# Patient Record
Sex: Female | Born: 1970 | Race: Black or African American | Hispanic: No | Marital: Single | Smoking: Never smoker
Health system: Southern US, Community
[De-identification: ages and names within clinical notes are randomized; demographics above are authoritative.]

## PROBLEM LIST (undated history)

## (undated) ENCOUNTER — Ambulatory Visit (HOSPITAL_COMMUNITY): Discharge status: Against Medical Advice | Payer: Medicare Other

## (undated) DIAGNOSIS — Z973 Presence of spectacles and contact lenses: Secondary | ICD-10-CM

## (undated) DIAGNOSIS — E785 Hyperlipidemia, unspecified: Secondary | ICD-10-CM

## (undated) DIAGNOSIS — J329 Chronic sinusitis, unspecified: Secondary | ICD-10-CM

## (undated) DIAGNOSIS — M1990 Unspecified osteoarthritis, unspecified site: Secondary | ICD-10-CM

## (undated) DIAGNOSIS — M2550 Pain in unspecified joint: Secondary | ICD-10-CM

## (undated) DIAGNOSIS — R519 Headache, unspecified: Secondary | ICD-10-CM

## (undated) DIAGNOSIS — R0602 Shortness of breath: Secondary | ICD-10-CM

## (undated) DIAGNOSIS — G4730 Sleep apnea, unspecified: Secondary | ICD-10-CM

## (undated) DIAGNOSIS — G43909 Migraine, unspecified, not intractable, without status migrainosus: Secondary | ICD-10-CM

## (undated) DIAGNOSIS — M549 Dorsalgia, unspecified: Secondary | ICD-10-CM

## (undated) DIAGNOSIS — R51 Headache: Secondary | ICD-10-CM

## (undated) DIAGNOSIS — R079 Chest pain, unspecified: Secondary | ICD-10-CM

## (undated) DIAGNOSIS — R5383 Other fatigue: Secondary | ICD-10-CM

## (undated) DIAGNOSIS — I8391 Asymptomatic varicose veins of right lower extremity: Secondary | ICD-10-CM

## (undated) DIAGNOSIS — F419 Anxiety disorder, unspecified: Secondary | ICD-10-CM

## (undated) DIAGNOSIS — D649 Anemia, unspecified: Secondary | ICD-10-CM

## (undated) DIAGNOSIS — G8929 Other chronic pain: Secondary | ICD-10-CM

## (undated) DIAGNOSIS — I10 Essential (primary) hypertension: Secondary | ICD-10-CM

## (undated) DIAGNOSIS — E669 Obesity, unspecified: Secondary | ICD-10-CM

## (undated) DIAGNOSIS — I82409 Acute embolism and thrombosis of unspecified deep veins of unspecified lower extremity: Secondary | ICD-10-CM

## (undated) DIAGNOSIS — J189 Pneumonia, unspecified organism: Secondary | ICD-10-CM

## (undated) DIAGNOSIS — I2699 Other pulmonary embolism without acute cor pulmonale: Secondary | ICD-10-CM

## (undated) DIAGNOSIS — Z972 Presence of dental prosthetic device (complete) (partial): Secondary | ICD-10-CM

## (undated) DIAGNOSIS — F32A Depression, unspecified: Secondary | ICD-10-CM

## (undated) DIAGNOSIS — I878 Other specified disorders of veins: Secondary | ICD-10-CM

## (undated) DIAGNOSIS — K219 Gastro-esophageal reflux disease without esophagitis: Secondary | ICD-10-CM

## (undated) DIAGNOSIS — F329 Major depressive disorder, single episode, unspecified: Secondary | ICD-10-CM

## (undated) DIAGNOSIS — J42 Unspecified chronic bronchitis: Secondary | ICD-10-CM

## (undated) HISTORY — DX: Essential (primary) hypertension: I10

## (undated) HISTORY — PX: MULTIPLE TOOTH EXTRACTIONS: SHX2053

## (undated) HISTORY — DX: Pain in unspecified joint: M25.50

## (undated) HISTORY — PX: TONSILLECTOMY: SUR1361

## (undated) HISTORY — DX: Shortness of breath: R06.02

## (undated) HISTORY — DX: Other fatigue: R53.83

## (undated) HISTORY — DX: Obesity, unspecified: E66.9

## (undated) HISTORY — PX: LAPAROSCOPIC GASTRIC BYPASS: SUR771

## (undated) HISTORY — DX: Hyperlipidemia, unspecified: E78.5

## (undated) HISTORY — DX: Chest pain, unspecified: R07.9

---

## 1997-11-11 ENCOUNTER — Inpatient Hospital Stay (HOSPITAL_COMMUNITY): Admission: AD | Admit: 1997-11-11 | Discharge: 1997-11-19 | Payer: Self-pay | Admitting: Internal Medicine

## 1998-09-30 ENCOUNTER — Encounter: Admission: RE | Admit: 1998-09-30 | Discharge: 1998-10-23 | Payer: Self-pay | Admitting: Internal Medicine

## 1998-11-11 ENCOUNTER — Encounter: Admission: RE | Admit: 1998-11-11 | Discharge: 1999-02-09 | Payer: Self-pay | Admitting: Internal Medicine

## 1999-02-16 ENCOUNTER — Encounter: Admission: RE | Admit: 1999-02-16 | Discharge: 1999-05-14 | Payer: Self-pay | Admitting: Orthopedic Surgery

## 1999-05-14 ENCOUNTER — Encounter: Admission: RE | Admit: 1999-05-14 | Discharge: 1999-08-12 | Payer: Self-pay | Admitting: Internal Medicine

## 1999-08-12 ENCOUNTER — Encounter (HOSPITAL_COMMUNITY): Admission: RE | Admit: 1999-08-12 | Discharge: 1999-09-24 | Payer: Self-pay | Admitting: Internal Medicine

## 1999-09-29 ENCOUNTER — Encounter: Admission: RE | Admit: 1999-09-29 | Discharge: 1999-12-28 | Payer: Self-pay | Admitting: Internal Medicine

## 1999-10-07 ENCOUNTER — Encounter: Admission: RE | Admit: 1999-10-07 | Discharge: 1999-10-07 | Payer: Self-pay | Admitting: Obstetrics

## 1999-10-25 ENCOUNTER — Emergency Department (HOSPITAL_COMMUNITY): Admission: EM | Admit: 1999-10-25 | Discharge: 1999-10-25 | Payer: Self-pay | Admitting: Emergency Medicine

## 1999-11-24 ENCOUNTER — Emergency Department (HOSPITAL_COMMUNITY): Admission: EM | Admit: 1999-11-24 | Discharge: 1999-11-24 | Payer: Self-pay | Admitting: Internal Medicine

## 1999-12-29 ENCOUNTER — Encounter: Admission: RE | Admit: 1999-12-29 | Discharge: 2000-03-28 | Payer: Self-pay | Admitting: Internal Medicine

## 2000-03-29 ENCOUNTER — Encounter: Admission: RE | Admit: 2000-03-29 | Discharge: 2000-06-27 | Payer: Self-pay | Admitting: Internal Medicine

## 2000-06-27 ENCOUNTER — Encounter (HOSPITAL_COMMUNITY): Admission: RE | Admit: 2000-06-27 | Discharge: 2000-09-25 | Payer: Self-pay | Admitting: Internal Medicine

## 2000-06-30 ENCOUNTER — Encounter: Admission: RE | Admit: 2000-06-30 | Discharge: 2000-09-28 | Payer: Self-pay | Admitting: Orthopedic Surgery

## 2000-10-02 ENCOUNTER — Encounter (HOSPITAL_COMMUNITY): Admission: RE | Admit: 2000-10-02 | Discharge: 2000-12-31 | Payer: Self-pay | Admitting: Family Medicine

## 2000-10-17 ENCOUNTER — Encounter: Admission: RE | Admit: 2000-10-17 | Discharge: 2001-01-15 | Payer: Self-pay | Admitting: Orthopedic Surgery

## 2000-12-02 ENCOUNTER — Emergency Department (HOSPITAL_COMMUNITY): Admission: EM | Admit: 2000-12-02 | Discharge: 2000-12-02 | Payer: Self-pay | Admitting: Emergency Medicine

## 2001-01-15 ENCOUNTER — Encounter: Admission: RE | Admit: 2001-01-15 | Discharge: 2001-04-15 | Payer: Self-pay | Admitting: Orthopedic Surgery

## 2001-04-18 ENCOUNTER — Encounter: Admission: RE | Admit: 2001-04-18 | Discharge: 2001-07-17 | Payer: Self-pay | Admitting: Internal Medicine

## 2001-09-04 ENCOUNTER — Encounter (HOSPITAL_BASED_OUTPATIENT_CLINIC_OR_DEPARTMENT_OTHER): Admission: RE | Admit: 2001-09-04 | Discharge: 2001-12-03 | Payer: Self-pay | Admitting: Orthopedic Surgery

## 2001-11-13 ENCOUNTER — Inpatient Hospital Stay (HOSPITAL_COMMUNITY): Admission: AD | Admit: 2001-11-13 | Discharge: 2001-11-13 | Payer: Self-pay | Admitting: *Deleted

## 2001-11-13 ENCOUNTER — Encounter: Payer: Self-pay | Admitting: *Deleted

## 2001-12-13 ENCOUNTER — Encounter: Admission: RE | Admit: 2001-12-13 | Discharge: 2001-12-13 | Payer: Self-pay | Admitting: Internal Medicine

## 2001-12-24 ENCOUNTER — Ambulatory Visit (HOSPITAL_COMMUNITY): Admission: RE | Admit: 2001-12-24 | Discharge: 2001-12-24 | Payer: Self-pay | Admitting: Obstetrics and Gynecology

## 2001-12-24 ENCOUNTER — Encounter (INDEPENDENT_AMBULATORY_CARE_PROVIDER_SITE_OTHER): Payer: Self-pay | Admitting: Specialist

## 2001-12-24 HISTORY — PX: HYSTEROSCOPY W/D&C: SHX1775

## 2002-01-08 ENCOUNTER — Encounter: Admission: RE | Admit: 2002-01-08 | Discharge: 2002-01-08 | Payer: Self-pay | Admitting: *Deleted

## 2002-06-06 ENCOUNTER — Encounter (HOSPITAL_BASED_OUTPATIENT_CLINIC_OR_DEPARTMENT_OTHER): Admission: RE | Admit: 2002-06-06 | Discharge: 2002-09-04 | Payer: Self-pay | Admitting: Internal Medicine

## 2002-06-15 ENCOUNTER — Encounter: Payer: Self-pay | Admitting: *Deleted

## 2002-06-15 ENCOUNTER — Inpatient Hospital Stay (HOSPITAL_COMMUNITY): Admission: EM | Admit: 2002-06-15 | Discharge: 2002-06-19 | Payer: Self-pay | Admitting: *Deleted

## 2002-09-06 ENCOUNTER — Encounter (HOSPITAL_BASED_OUTPATIENT_CLINIC_OR_DEPARTMENT_OTHER): Admission: RE | Admit: 2002-09-06 | Discharge: 2002-12-05 | Payer: Self-pay | Admitting: Internal Medicine

## 2002-12-06 ENCOUNTER — Encounter (HOSPITAL_BASED_OUTPATIENT_CLINIC_OR_DEPARTMENT_OTHER): Admission: RE | Admit: 2002-12-06 | Discharge: 2003-03-06 | Payer: Self-pay | Admitting: Internal Medicine

## 2003-03-07 ENCOUNTER — Encounter (HOSPITAL_BASED_OUTPATIENT_CLINIC_OR_DEPARTMENT_OTHER): Admission: RE | Admit: 2003-03-07 | Discharge: 2003-06-05 | Payer: Self-pay | Admitting: Internal Medicine

## 2003-03-18 ENCOUNTER — Ambulatory Visit (HOSPITAL_COMMUNITY): Admission: RE | Admit: 2003-03-18 | Discharge: 2003-03-18 | Payer: Self-pay | Admitting: Orthopedic Surgery

## 2003-06-08 ENCOUNTER — Emergency Department (HOSPITAL_COMMUNITY): Admission: EM | Admit: 2003-06-08 | Discharge: 2003-06-08 | Payer: Self-pay | Admitting: Emergency Medicine

## 2003-06-10 ENCOUNTER — Encounter (HOSPITAL_BASED_OUTPATIENT_CLINIC_OR_DEPARTMENT_OTHER): Admission: RE | Admit: 2003-06-10 | Discharge: 2003-09-05 | Payer: Self-pay | Admitting: Internal Medicine

## 2003-08-02 ENCOUNTER — Emergency Department (HOSPITAL_COMMUNITY): Admission: EM | Admit: 2003-08-02 | Discharge: 2003-08-02 | Payer: Self-pay | Admitting: Emergency Medicine

## 2003-09-08 ENCOUNTER — Encounter (HOSPITAL_BASED_OUTPATIENT_CLINIC_OR_DEPARTMENT_OTHER): Admission: RE | Admit: 2003-09-08 | Discharge: 2003-12-07 | Payer: Self-pay | Admitting: Internal Medicine

## 2003-12-09 ENCOUNTER — Encounter (HOSPITAL_BASED_OUTPATIENT_CLINIC_OR_DEPARTMENT_OTHER): Admission: RE | Admit: 2003-12-09 | Discharge: 2004-03-08 | Payer: Self-pay | Admitting: Internal Medicine

## 2004-01-11 ENCOUNTER — Inpatient Hospital Stay (HOSPITAL_COMMUNITY): Admission: AD | Admit: 2004-01-11 | Discharge: 2004-01-11 | Payer: Self-pay | Admitting: Obstetrics and Gynecology

## 2004-02-25 ENCOUNTER — Inpatient Hospital Stay (HOSPITAL_COMMUNITY): Admission: AD | Admit: 2004-02-25 | Discharge: 2004-03-11 | Payer: Self-pay | Admitting: Internal Medicine

## 2004-03-10 ENCOUNTER — Ambulatory Visit: Payer: Self-pay | Admitting: Plastic Surgery

## 2004-03-11 ENCOUNTER — Encounter (HOSPITAL_BASED_OUTPATIENT_CLINIC_OR_DEPARTMENT_OTHER): Admission: RE | Admit: 2004-03-11 | Discharge: 2004-06-09 | Payer: Self-pay | Admitting: Internal Medicine

## 2004-03-15 ENCOUNTER — Ambulatory Visit: Payer: Self-pay | Admitting: Internal Medicine

## 2004-04-12 ENCOUNTER — Ambulatory Visit: Payer: Self-pay | Admitting: Internal Medicine

## 2004-05-24 ENCOUNTER — Ambulatory Visit: Payer: Self-pay | Admitting: Internal Medicine

## 2004-06-22 ENCOUNTER — Ambulatory Visit: Payer: Self-pay | Admitting: Internal Medicine

## 2004-09-10 ENCOUNTER — Ambulatory Visit: Payer: Self-pay | Admitting: Internal Medicine

## 2004-09-30 ENCOUNTER — Ambulatory Visit: Payer: Self-pay | Admitting: Internal Medicine

## 2004-12-20 ENCOUNTER — Ambulatory Visit: Payer: Self-pay | Admitting: Internal Medicine

## 2005-05-11 ENCOUNTER — Ambulatory Visit: Payer: Self-pay | Admitting: Internal Medicine

## 2005-06-13 ENCOUNTER — Encounter: Admission: RE | Admit: 2005-06-13 | Discharge: 2005-09-11 | Payer: Self-pay | Admitting: Internal Medicine

## 2005-07-12 ENCOUNTER — Encounter (HOSPITAL_BASED_OUTPATIENT_CLINIC_OR_DEPARTMENT_OTHER): Admission: RE | Admit: 2005-07-12 | Discharge: 2005-07-18 | Payer: Self-pay | Admitting: Surgery

## 2005-07-18 ENCOUNTER — Ambulatory Visit: Payer: Self-pay | Admitting: Family Medicine

## 2005-08-10 ENCOUNTER — Ambulatory Visit: Payer: Self-pay | Admitting: Internal Medicine

## 2005-08-22 ENCOUNTER — Ambulatory Visit (HOSPITAL_COMMUNITY): Admission: RE | Admit: 2005-08-22 | Discharge: 2005-08-22 | Payer: Self-pay | Admitting: Orthopedic Surgery

## 2005-08-24 ENCOUNTER — Ambulatory Visit: Payer: Self-pay | Admitting: Internal Medicine

## 2005-08-25 ENCOUNTER — Ambulatory Visit: Payer: Self-pay | Admitting: Internal Medicine

## 2005-08-26 ENCOUNTER — Ambulatory Visit: Payer: Self-pay | Admitting: Internal Medicine

## 2005-10-05 ENCOUNTER — Encounter: Admission: RE | Admit: 2005-10-05 | Discharge: 2005-10-05 | Payer: Self-pay | Admitting: Internal Medicine

## 2005-10-12 ENCOUNTER — Ambulatory Visit: Payer: Self-pay | Admitting: Internal Medicine

## 2005-11-02 ENCOUNTER — Encounter: Admission: RE | Admit: 2005-11-02 | Discharge: 2006-01-31 | Payer: Self-pay | Admitting: Internal Medicine

## 2005-11-24 ENCOUNTER — Ambulatory Visit: Payer: Self-pay | Admitting: Obstetrics and Gynecology

## 2005-11-25 ENCOUNTER — Ambulatory Visit: Payer: Self-pay | Admitting: Obstetrics & Gynecology

## 2005-12-15 ENCOUNTER — Encounter: Admission: RE | Admit: 2005-12-15 | Discharge: 2005-12-15 | Payer: Self-pay | Admitting: Internal Medicine

## 2006-01-08 ENCOUNTER — Inpatient Hospital Stay (HOSPITAL_COMMUNITY): Admission: EM | Admit: 2006-01-08 | Discharge: 2006-01-14 | Payer: Self-pay | Admitting: Emergency Medicine

## 2006-01-08 ENCOUNTER — Encounter: Payer: Self-pay | Admitting: Vascular Surgery

## 2006-01-08 ENCOUNTER — Ambulatory Visit: Payer: Self-pay | Admitting: Family Medicine

## 2006-01-17 ENCOUNTER — Ambulatory Visit: Payer: Self-pay | Admitting: Family Medicine

## 2006-02-09 ENCOUNTER — Ambulatory Visit: Payer: Self-pay | Admitting: Family Medicine

## 2006-02-10 ENCOUNTER — Encounter: Admission: RE | Admit: 2006-02-10 | Discharge: 2006-05-11 | Payer: Self-pay | Admitting: Internal Medicine

## 2006-02-14 ENCOUNTER — Ambulatory Visit: Payer: Self-pay | Admitting: Internal Medicine

## 2006-03-13 ENCOUNTER — Ambulatory Visit: Payer: Self-pay | Admitting: Internal Medicine

## 2006-04-18 ENCOUNTER — Encounter: Admission: RE | Admit: 2006-04-18 | Discharge: 2006-07-17 | Payer: Self-pay | Admitting: Internal Medicine

## 2006-05-05 ENCOUNTER — Ambulatory Visit: Payer: Self-pay | Admitting: Internal Medicine

## 2006-06-02 ENCOUNTER — Ambulatory Visit: Payer: Self-pay | Admitting: Internal Medicine

## 2006-06-13 ENCOUNTER — Ambulatory Visit: Payer: Self-pay | Admitting: Internal Medicine

## 2006-07-12 ENCOUNTER — Ambulatory Visit: Payer: Self-pay | Admitting: Internal Medicine

## 2006-07-28 ENCOUNTER — Ambulatory Visit: Payer: Self-pay | Admitting: Internal Medicine

## 2006-09-19 ENCOUNTER — Ambulatory Visit: Payer: Self-pay | Admitting: Internal Medicine

## 2006-10-19 ENCOUNTER — Ambulatory Visit: Payer: Self-pay | Admitting: Internal Medicine

## 2006-11-03 ENCOUNTER — Ambulatory Visit: Payer: Self-pay | Admitting: Internal Medicine

## 2006-11-23 ENCOUNTER — Ambulatory Visit: Payer: Self-pay | Admitting: Internal Medicine

## 2006-12-02 DIAGNOSIS — G4733 Obstructive sleep apnea (adult) (pediatric): Secondary | ICD-10-CM | POA: Insufficient documentation

## 2006-12-02 DIAGNOSIS — K219 Gastro-esophageal reflux disease without esophagitis: Secondary | ICD-10-CM | POA: Insufficient documentation

## 2006-12-02 DIAGNOSIS — Z86711 Personal history of pulmonary embolism: Secondary | ICD-10-CM | POA: Insufficient documentation

## 2006-12-02 DIAGNOSIS — I87039 Postthrombotic syndrome with ulcer and inflammation of unspecified lower extremity: Secondary | ICD-10-CM | POA: Insufficient documentation

## 2006-12-02 DIAGNOSIS — I10 Essential (primary) hypertension: Secondary | ICD-10-CM | POA: Insufficient documentation

## 2006-12-02 DIAGNOSIS — D508 Other iron deficiency anemias: Secondary | ICD-10-CM | POA: Insufficient documentation

## 2006-12-02 DIAGNOSIS — Z86718 Personal history of other venous thrombosis and embolism: Secondary | ICD-10-CM | POA: Insufficient documentation

## 2006-12-07 ENCOUNTER — Ambulatory Visit: Payer: Self-pay | Admitting: Obstetrics & Gynecology

## 2006-12-07 ENCOUNTER — Encounter (INDEPENDENT_AMBULATORY_CARE_PROVIDER_SITE_OTHER): Payer: Self-pay | Admitting: Gynecology

## 2007-01-03 ENCOUNTER — Ambulatory Visit: Payer: Self-pay | Admitting: Internal Medicine

## 2007-01-04 ENCOUNTER — Ambulatory Visit: Payer: Self-pay | Admitting: Obstetrics & Gynecology

## 2007-01-09 ENCOUNTER — Telehealth (INDEPENDENT_AMBULATORY_CARE_PROVIDER_SITE_OTHER): Payer: Self-pay | Admitting: Internal Medicine

## 2007-01-26 ENCOUNTER — Telehealth (INDEPENDENT_AMBULATORY_CARE_PROVIDER_SITE_OTHER): Payer: Self-pay | Admitting: Internal Medicine

## 2007-01-28 ENCOUNTER — Encounter (INDEPENDENT_AMBULATORY_CARE_PROVIDER_SITE_OTHER): Payer: Self-pay | Admitting: Internal Medicine

## 2007-02-01 ENCOUNTER — Ambulatory Visit: Payer: Self-pay | Admitting: Obstetrics and Gynecology

## 2007-02-01 ENCOUNTER — Ambulatory Visit: Payer: Self-pay | Admitting: Internal Medicine

## 2007-02-23 ENCOUNTER — Encounter (INDEPENDENT_AMBULATORY_CARE_PROVIDER_SITE_OTHER): Payer: Self-pay | Admitting: Internal Medicine

## 2007-03-03 ENCOUNTER — Emergency Department (HOSPITAL_COMMUNITY): Admission: EM | Admit: 2007-03-03 | Discharge: 2007-03-03 | Payer: Self-pay | Admitting: Emergency Medicine

## 2007-06-04 ENCOUNTER — Encounter (INDEPENDENT_AMBULATORY_CARE_PROVIDER_SITE_OTHER): Payer: Self-pay | Admitting: Internal Medicine

## 2007-06-21 ENCOUNTER — Encounter (INDEPENDENT_AMBULATORY_CARE_PROVIDER_SITE_OTHER): Payer: Self-pay | Admitting: Internal Medicine

## 2007-06-24 ENCOUNTER — Telehealth (INDEPENDENT_AMBULATORY_CARE_PROVIDER_SITE_OTHER): Payer: Self-pay | Admitting: Internal Medicine

## 2007-06-28 ENCOUNTER — Ambulatory Visit: Payer: Self-pay | Admitting: Internal Medicine

## 2007-07-12 ENCOUNTER — Ambulatory Visit: Payer: Self-pay | Admitting: Internal Medicine

## 2007-08-03 ENCOUNTER — Encounter (INDEPENDENT_AMBULATORY_CARE_PROVIDER_SITE_OTHER): Payer: Self-pay | Admitting: Internal Medicine

## 2007-08-07 ENCOUNTER — Encounter: Admission: RE | Admit: 2007-08-07 | Discharge: 2007-11-05 | Payer: Self-pay | Admitting: Internal Medicine

## 2007-08-27 ENCOUNTER — Telehealth (INDEPENDENT_AMBULATORY_CARE_PROVIDER_SITE_OTHER): Payer: Self-pay | Admitting: Internal Medicine

## 2007-08-30 ENCOUNTER — Telehealth (INDEPENDENT_AMBULATORY_CARE_PROVIDER_SITE_OTHER): Payer: Self-pay | Admitting: Internal Medicine

## 2007-09-05 ENCOUNTER — Telehealth (INDEPENDENT_AMBULATORY_CARE_PROVIDER_SITE_OTHER): Payer: Self-pay | Admitting: Internal Medicine

## 2007-09-14 ENCOUNTER — Ambulatory Visit: Payer: Self-pay | Admitting: Internal Medicine

## 2007-09-14 LAB — CONVERTED CEMR LAB
Alkaline Phosphatase: 148 units/L — ABNORMAL HIGH (ref 39–117)
Creatinine, Ser: 1.2 mg/dL (ref 0.40–1.20)
Glucose, Bld: 67 mg/dL — ABNORMAL LOW (ref 70–99)
HDL: 52 mg/dL (ref >39)
LDL Cholesterol: 92 mg/dL (ref 0–99)
Sodium: 142 meq/L (ref 135–145)
Total Bilirubin: 0.4 mg/dL (ref 0.3–1.2)
Total CHOL/HDL Ratio: 2.9
Total Protein: 7.7 g/dL (ref 6.0–8.3)
Triglycerides: 46 mg/dL (ref <150)
VLDL: 9 mg/dL (ref 0–40)

## 2007-09-15 ENCOUNTER — Encounter (INDEPENDENT_AMBULATORY_CARE_PROVIDER_SITE_OTHER): Payer: Self-pay | Admitting: Internal Medicine

## 2007-09-16 ENCOUNTER — Encounter (INDEPENDENT_AMBULATORY_CARE_PROVIDER_SITE_OTHER): Payer: Self-pay | Admitting: Internal Medicine

## 2007-09-20 ENCOUNTER — Ambulatory Visit: Payer: Self-pay | Admitting: Internal Medicine

## 2007-09-26 ENCOUNTER — Telehealth (INDEPENDENT_AMBULATORY_CARE_PROVIDER_SITE_OTHER): Payer: Self-pay | Admitting: Internal Medicine

## 2007-09-28 ENCOUNTER — Ambulatory Visit: Payer: Self-pay | Admitting: Internal Medicine

## 2007-09-30 ENCOUNTER — Ambulatory Visit: Payer: Self-pay | Admitting: Internal Medicine

## 2007-09-30 ENCOUNTER — Inpatient Hospital Stay (HOSPITAL_COMMUNITY): Admission: EM | Admit: 2007-09-30 | Discharge: 2007-10-02 | Payer: Self-pay | Admitting: Emergency Medicine

## 2007-10-02 ENCOUNTER — Encounter (INDEPENDENT_AMBULATORY_CARE_PROVIDER_SITE_OTHER): Payer: Self-pay | Admitting: Internal Medicine

## 2007-10-08 ENCOUNTER — Encounter (INDEPENDENT_AMBULATORY_CARE_PROVIDER_SITE_OTHER): Payer: Self-pay | Admitting: Internal Medicine

## 2007-10-08 ENCOUNTER — Encounter: Payer: Self-pay | Admitting: Infectious Diseases

## 2007-10-10 ENCOUNTER — Ambulatory Visit: Payer: Self-pay | Admitting: Infectious Diseases

## 2007-10-10 DIAGNOSIS — L089 Local infection of the skin and subcutaneous tissue, unspecified: Secondary | ICD-10-CM

## 2007-10-12 ENCOUNTER — Encounter (INDEPENDENT_AMBULATORY_CARE_PROVIDER_SITE_OTHER): Payer: Self-pay | Admitting: Internal Medicine

## 2007-10-18 ENCOUNTER — Ambulatory Visit: Payer: Self-pay | Admitting: Internal Medicine

## 2007-10-19 ENCOUNTER — Encounter (INDEPENDENT_AMBULATORY_CARE_PROVIDER_SITE_OTHER): Payer: Self-pay | Admitting: Internal Medicine

## 2007-11-05 ENCOUNTER — Ambulatory Visit: Payer: Self-pay | Admitting: Vascular Surgery

## 2007-11-05 ENCOUNTER — Encounter (INDEPENDENT_AMBULATORY_CARE_PROVIDER_SITE_OTHER): Payer: Self-pay | Admitting: Emergency Medicine

## 2007-11-05 ENCOUNTER — Inpatient Hospital Stay (HOSPITAL_COMMUNITY): Admission: EM | Admit: 2007-11-05 | Discharge: 2007-11-11 | Payer: Self-pay | Admitting: Emergency Medicine

## 2007-11-06 ENCOUNTER — Encounter (INDEPENDENT_AMBULATORY_CARE_PROVIDER_SITE_OTHER): Payer: Self-pay | Admitting: Hospitalist

## 2007-11-11 ENCOUNTER — Encounter (INDEPENDENT_AMBULATORY_CARE_PROVIDER_SITE_OTHER): Payer: Self-pay | Admitting: Internal Medicine

## 2007-11-13 ENCOUNTER — Encounter (INDEPENDENT_AMBULATORY_CARE_PROVIDER_SITE_OTHER): Payer: Self-pay | Admitting: Internal Medicine

## 2007-11-14 ENCOUNTER — Ambulatory Visit: Payer: Self-pay | Admitting: Internal Medicine

## 2007-11-20 ENCOUNTER — Encounter: Admission: RE | Admit: 2007-11-20 | Discharge: 2008-01-10 | Payer: Self-pay | Admitting: Internal Medicine

## 2007-11-21 ENCOUNTER — Encounter (INDEPENDENT_AMBULATORY_CARE_PROVIDER_SITE_OTHER): Payer: Self-pay | Admitting: Internal Medicine

## 2007-11-22 ENCOUNTER — Ambulatory Visit: Payer: Self-pay | Admitting: Internal Medicine

## 2007-11-22 LAB — CONVERTED CEMR LAB: Prothrombin Time: 34.4 s — ABNORMAL HIGH (ref 11.6–15.2)

## 2007-11-28 ENCOUNTER — Ambulatory Visit: Payer: Self-pay | Admitting: Internal Medicine

## 2007-12-05 ENCOUNTER — Ambulatory Visit: Payer: Self-pay | Admitting: Internal Medicine

## 2007-12-07 LAB — CONVERTED CEMR LAB
INR: 3.7 — ABNORMAL HIGH (ref 0.0–1.5)
Prothrombin Time: 40.1 s — ABNORMAL HIGH (ref 11.6–15.2)

## 2007-12-12 ENCOUNTER — Ambulatory Visit: Payer: Self-pay | Admitting: Internal Medicine

## 2007-12-13 ENCOUNTER — Ambulatory Visit: Payer: Self-pay | Admitting: Internal Medicine

## 2007-12-13 DIAGNOSIS — M545 Low back pain, unspecified: Secondary | ICD-10-CM | POA: Insufficient documentation

## 2007-12-13 LAB — CONVERTED CEMR LAB
INR: 3.2 — ABNORMAL HIGH (ref 0.0–1.5)
Prothrombin Time: 35 s — ABNORMAL HIGH (ref 11.6–15.2)

## 2007-12-26 ENCOUNTER — Encounter (INDEPENDENT_AMBULATORY_CARE_PROVIDER_SITE_OTHER): Payer: Self-pay | Admitting: Nurse Practitioner

## 2007-12-26 ENCOUNTER — Ambulatory Visit: Payer: Self-pay | Admitting: Internal Medicine

## 2007-12-27 LAB — CONVERTED CEMR LAB: Prothrombin Time: 32.2 s — ABNORMAL HIGH (ref 11.6–15.2)

## 2008-01-01 ENCOUNTER — Ambulatory Visit (HOSPITAL_COMMUNITY): Admission: RE | Admit: 2008-01-01 | Discharge: 2008-01-01 | Payer: Self-pay | Admitting: Orthopedic Surgery

## 2008-01-01 HISTORY — PX: INCISION AND DRAINAGE: SHX5863

## 2008-01-09 ENCOUNTER — Encounter (INDEPENDENT_AMBULATORY_CARE_PROVIDER_SITE_OTHER): Payer: Self-pay | Admitting: Internal Medicine

## 2008-01-10 ENCOUNTER — Telehealth (INDEPENDENT_AMBULATORY_CARE_PROVIDER_SITE_OTHER): Payer: Self-pay | Admitting: Internal Medicine

## 2008-01-10 ENCOUNTER — Ambulatory Visit: Payer: Self-pay | Admitting: Obstetrics and Gynecology

## 2008-01-10 ENCOUNTER — Encounter (INDEPENDENT_AMBULATORY_CARE_PROVIDER_SITE_OTHER): Payer: Self-pay | Admitting: Family Medicine

## 2008-01-22 ENCOUNTER — Encounter: Admission: RE | Admit: 2008-01-22 | Discharge: 2008-04-21 | Payer: Self-pay | Admitting: Internal Medicine

## 2008-01-23 ENCOUNTER — Encounter (INDEPENDENT_AMBULATORY_CARE_PROVIDER_SITE_OTHER): Payer: Self-pay | Admitting: Internal Medicine

## 2008-01-24 ENCOUNTER — Ambulatory Visit: Payer: Self-pay | Admitting: Internal Medicine

## 2008-02-21 ENCOUNTER — Telehealth (INDEPENDENT_AMBULATORY_CARE_PROVIDER_SITE_OTHER): Payer: Self-pay | Admitting: Internal Medicine

## 2008-03-20 ENCOUNTER — Encounter (INDEPENDENT_AMBULATORY_CARE_PROVIDER_SITE_OTHER): Payer: Self-pay | Admitting: Internal Medicine

## 2008-04-30 ENCOUNTER — Ambulatory Visit (HOSPITAL_COMMUNITY): Admission: RE | Admit: 2008-04-30 | Discharge: 2008-04-30 | Payer: Self-pay | Admitting: Orthopedic Surgery

## 2008-06-09 ENCOUNTER — Telehealth (INDEPENDENT_AMBULATORY_CARE_PROVIDER_SITE_OTHER): Payer: Self-pay | Admitting: Internal Medicine

## 2008-06-12 ENCOUNTER — Ambulatory Visit: Payer: Self-pay | Admitting: Internal Medicine

## 2008-07-03 ENCOUNTER — Ambulatory Visit: Payer: Self-pay | Admitting: Internal Medicine

## 2008-07-09 LAB — CONVERTED CEMR LAB
INR: 2.5 — ABNORMAL HIGH (ref 0.0–1.5)
Prothrombin Time: 29 s — ABNORMAL HIGH (ref 11.6–15.2)

## 2008-07-19 IMAGING — CR DG CHEST 2V
2 series · 2 of 2 positions shown · non-contrast
Comparison: 11/07/2007

CLINICAL DATA: Shortness of breath

CHEST - 2 VIEW

[w chest pa]
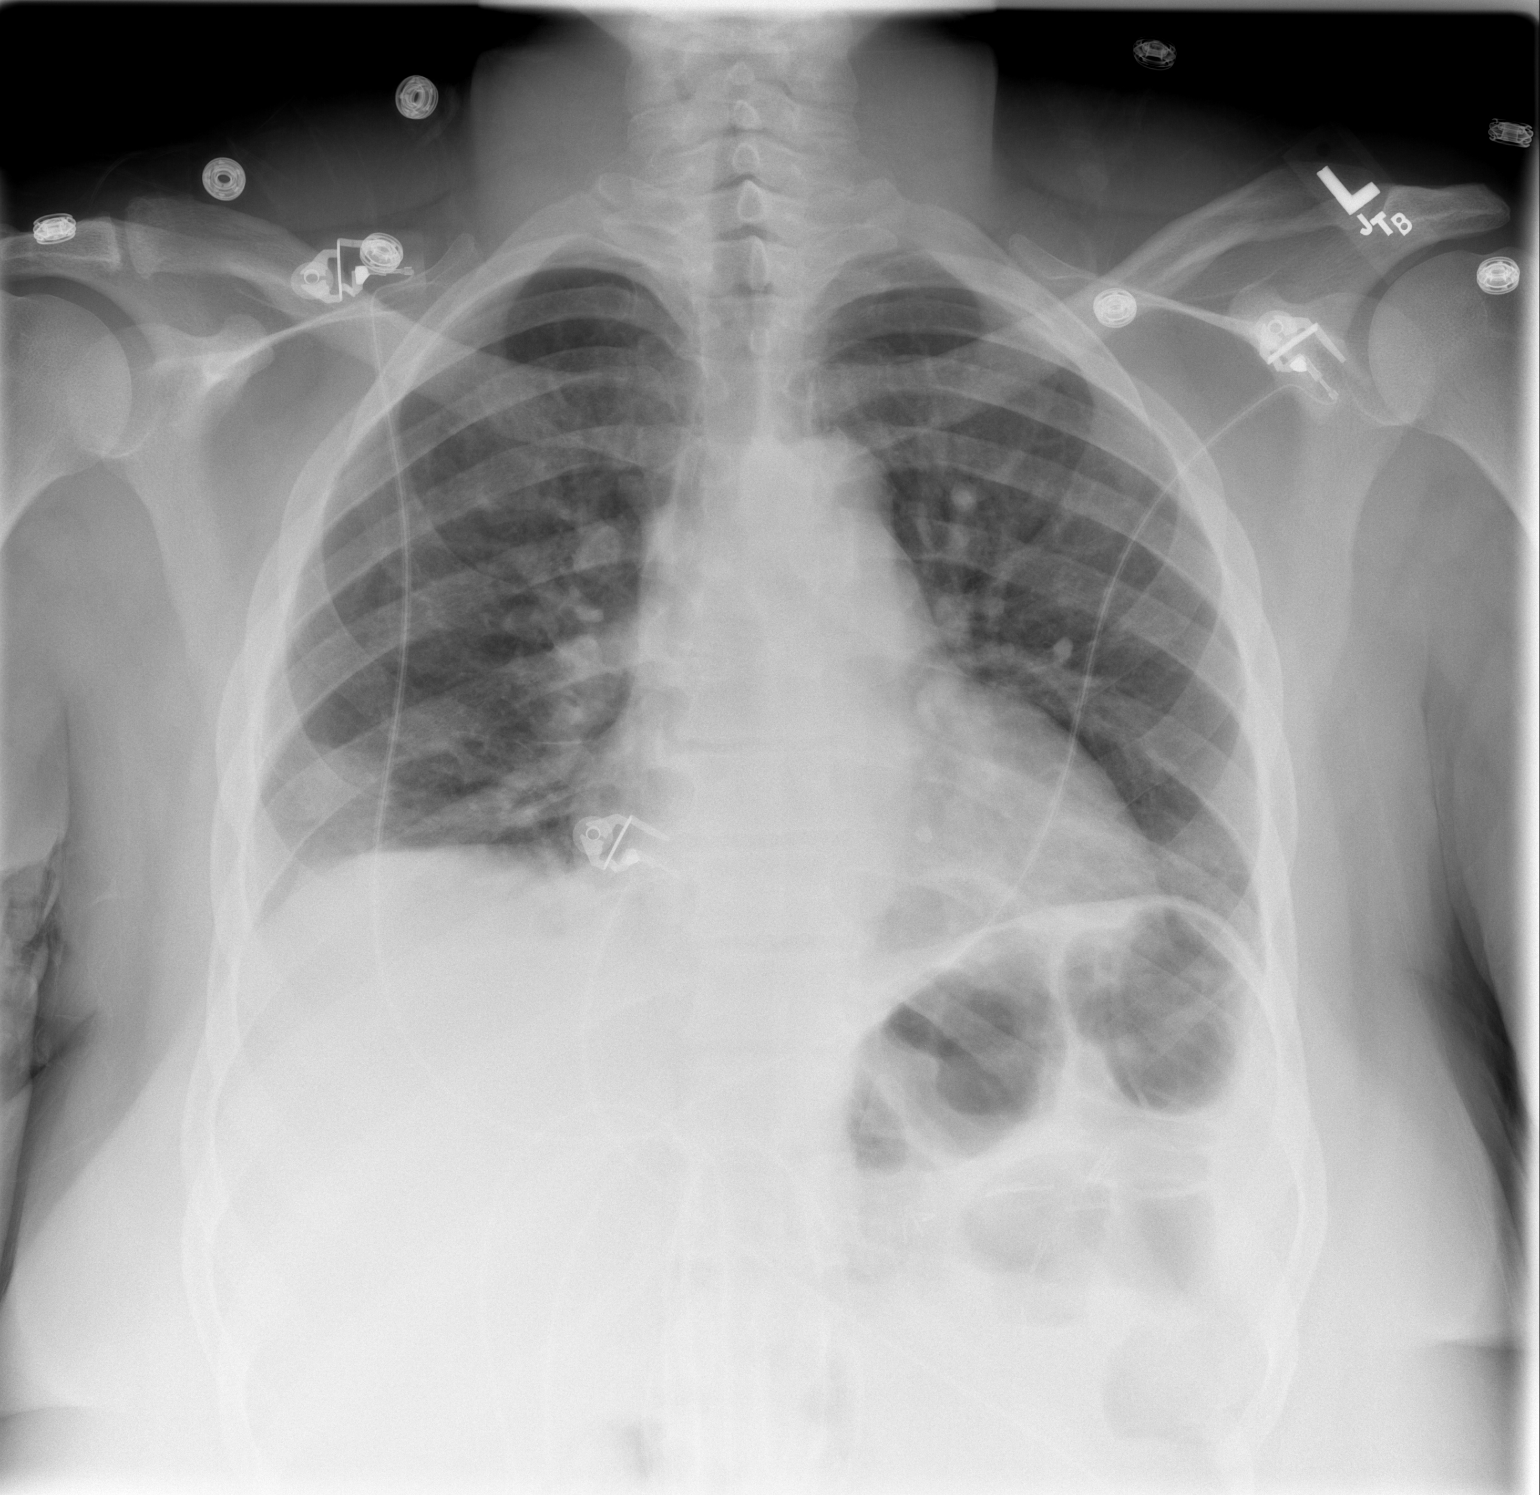

[w chest lat]
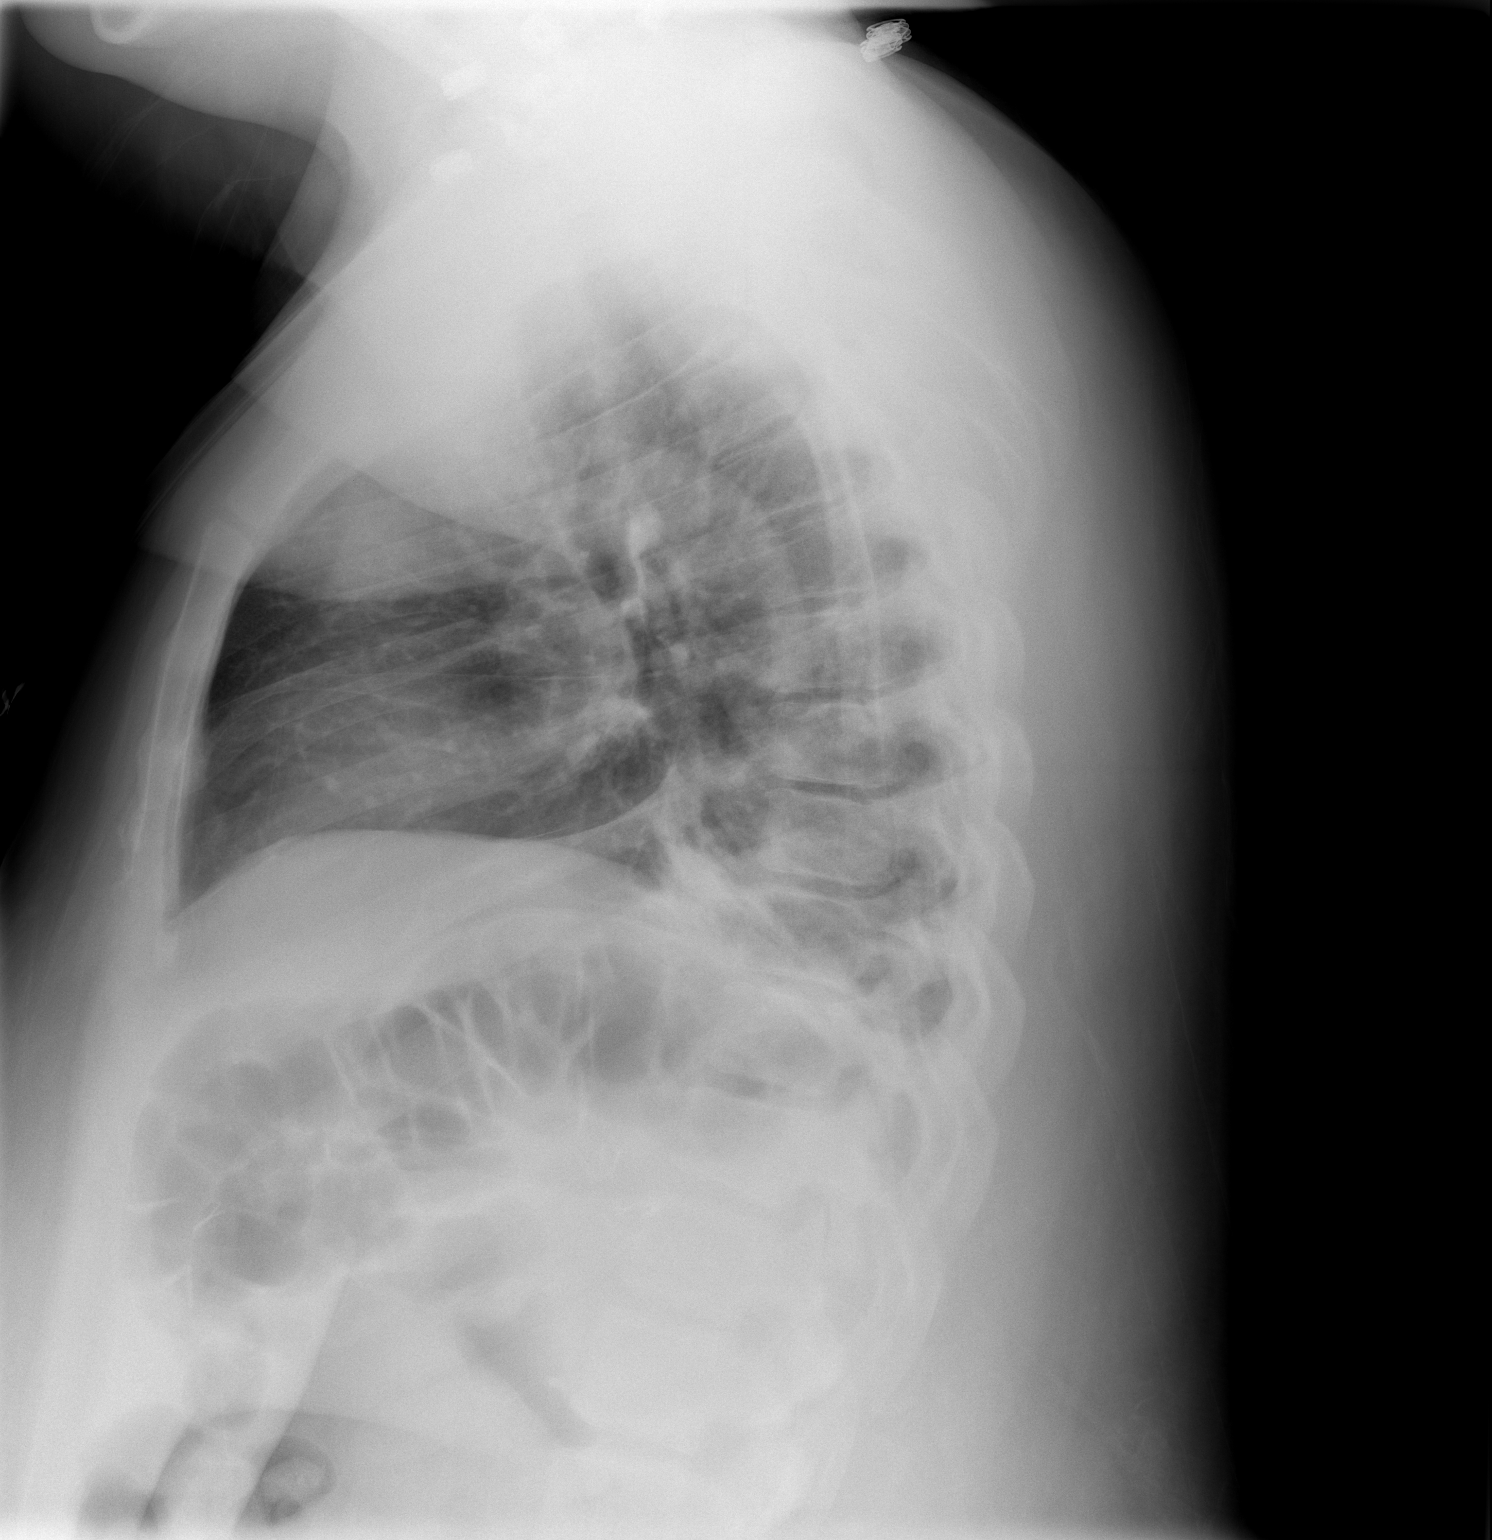

[2 of 2 positions shown; findings below may reference images not displayed]

FINDINGS: Patchy subsegmental atelectasis or infiltrates in the
basilar segments of both lower lobes, right greater than left,
stable since prior study.  Relatively low lung volumes.  Heart size
upper limits normal.  No overt interstitial edema.  No definite
effusion.
IMPRESSION: 1.  Stable bibasilar atelectasis or infiltrates.

## 2008-08-19 ENCOUNTER — Telehealth (INDEPENDENT_AMBULATORY_CARE_PROVIDER_SITE_OTHER): Payer: Self-pay | Admitting: Internal Medicine

## 2008-09-10 ENCOUNTER — Ambulatory Visit (HOSPITAL_COMMUNITY): Admission: RE | Admit: 2008-09-10 | Discharge: 2008-09-10 | Payer: Self-pay | Admitting: Orthopedic Surgery

## 2008-09-10 HISTORY — PX: INCISION AND DRAINAGE: SHX5863

## 2008-11-27 ENCOUNTER — Ambulatory Visit: Payer: Self-pay | Admitting: Internal Medicine

## 2008-12-01 LAB — CONVERTED CEMR LAB: Prothrombin Time: 36 s — ABNORMAL HIGH (ref 11.6–15.2)

## 2008-12-08 ENCOUNTER — Ambulatory Visit: Payer: Self-pay | Admitting: Internal Medicine

## 2008-12-15 LAB — CONVERTED CEMR LAB
INR: 2.5 — ABNORMAL HIGH (ref 0.0–1.5)
Prothrombin Time: 28.6 s — ABNORMAL HIGH (ref 11.6–15.2)

## 2009-01-08 ENCOUNTER — Ambulatory Visit: Payer: Self-pay | Admitting: Internal Medicine

## 2009-01-08 ENCOUNTER — Ambulatory Visit: Payer: Self-pay | Admitting: Obstetrics and Gynecology

## 2009-02-04 ENCOUNTER — Ambulatory Visit: Payer: Self-pay | Admitting: Obstetrics & Gynecology

## 2009-02-05 ENCOUNTER — Encounter (INDEPENDENT_AMBULATORY_CARE_PROVIDER_SITE_OTHER): Payer: Self-pay | Admitting: Internal Medicine

## 2009-02-12 ENCOUNTER — Ambulatory Visit: Payer: Self-pay | Admitting: Internal Medicine

## 2009-02-16 LAB — CONVERTED CEMR LAB: INR: 2.2 — ABNORMAL HIGH (ref 0.0–1.5)

## 2009-06-10 ENCOUNTER — Ambulatory Visit: Payer: Self-pay | Admitting: Internal Medicine

## 2009-06-15 LAB — CONVERTED CEMR LAB
Chloride: 108 meq/L (ref 96–112)
Creatinine, Ser: 0.94 mg/dL (ref 0.40–1.20)
INR: 2.85 — ABNORMAL HIGH (ref <1.50)
Potassium: 4.7 meq/L (ref 3.5–5.3)
Prothrombin Time: 29.7 s — ABNORMAL HIGH (ref 11.6–15.2)

## 2009-06-19 ENCOUNTER — Telehealth: Payer: Self-pay | Admitting: Physician Assistant

## 2009-06-26 ENCOUNTER — Emergency Department (HOSPITAL_COMMUNITY)
Admission: EM | Admit: 2009-06-26 | Discharge: 2009-06-26 | Payer: Self-pay | Source: Home / Self Care | Admitting: Emergency Medicine

## 2009-07-09 ENCOUNTER — Ambulatory Visit: Payer: Self-pay | Admitting: Internal Medicine

## 2009-07-18 LAB — CONVERTED CEMR LAB
Basophils Relative: 0 % (ref 0–1)
Hemoglobin: 14.2 g/dL (ref 12.0–15.0)
Lymphs Abs: 1.7 10*3/uL (ref 0.7–4.0)
MCHC: 33 g/dL (ref 30.0–36.0)
MCV: 85.5 fL (ref 78.0–100.0)
Monocytes Absolute: 0.5 10*3/uL (ref 0.1–1.0)
Monocytes Relative: 9 % (ref 3–12)
Neutro Abs: 3.6 10*3/uL (ref 1.7–7.7)
Neutrophils Relative %: 61 % (ref 43–77)
Prothrombin Time: 21.2 s — ABNORMAL HIGH (ref 11.6–15.2)
RBC: 5.03 M/uL (ref 3.87–5.11)
WBC: 5.8 10*3/uL (ref 4.0–10.5)

## 2009-07-23 ENCOUNTER — Ambulatory Visit: Payer: Self-pay | Admitting: Internal Medicine

## 2009-07-29 LAB — CONVERTED CEMR LAB
CO2: 32 meq/L (ref 19–32)
Calcium: 9.9 mg/dL (ref 8.4–10.5)
Creatinine, Ser: 1.14 mg/dL (ref 0.40–1.20)
INR: 1.98 — ABNORMAL HIGH (ref <1.50)
Prothrombin Time: 22.3 s — ABNORMAL HIGH (ref 11.6–15.2)
Sodium: 141 meq/L (ref 135–145)

## 2009-08-18 ENCOUNTER — Ambulatory Visit: Payer: Self-pay | Admitting: Internal Medicine

## 2009-08-21 LAB — CONVERTED CEMR LAB
INR: 1.75 — ABNORMAL HIGH (ref <1.50)
Prothrombin Time: 20.3 s — ABNORMAL HIGH (ref 11.6–15.2)

## 2009-08-31 ENCOUNTER — Encounter: Payer: Self-pay | Admitting: Physician Assistant

## 2009-08-31 ENCOUNTER — Ambulatory Visit: Payer: Self-pay | Admitting: Internal Medicine

## 2009-09-01 ENCOUNTER — Ambulatory Visit: Payer: Self-pay | Admitting: Internal Medicine

## 2009-09-01 LAB — CONVERTED CEMR LAB
INR: 1.91 — ABNORMAL HIGH (ref <1.50)
Prothrombin Time: 21.7 s — ABNORMAL HIGH (ref 11.6–15.2)

## 2009-09-03 ENCOUNTER — Ambulatory Visit: Payer: Self-pay | Admitting: Internal Medicine

## 2009-09-17 ENCOUNTER — Ambulatory Visit: Payer: Self-pay | Admitting: Internal Medicine

## 2009-10-07 ENCOUNTER — Ambulatory Visit: Payer: Self-pay | Admitting: Internal Medicine

## 2009-10-09 ENCOUNTER — Telehealth (INDEPENDENT_AMBULATORY_CARE_PROVIDER_SITE_OTHER): Payer: Self-pay | Admitting: Internal Medicine

## 2009-10-09 LAB — CONVERTED CEMR LAB: Prothrombin Time: 30.7 s — ABNORMAL HIGH (ref 11.6–15.2)

## 2009-10-23 ENCOUNTER — Telehealth: Payer: Self-pay | Admitting: Physician Assistant

## 2009-12-30 ENCOUNTER — Encounter (INDEPENDENT_AMBULATORY_CARE_PROVIDER_SITE_OTHER): Payer: Self-pay | Admitting: Internal Medicine

## 2010-01-12 ENCOUNTER — Ambulatory Visit: Payer: Self-pay | Admitting: Internal Medicine

## 2010-02-02 ENCOUNTER — Emergency Department (HOSPITAL_COMMUNITY): Admission: EM | Admit: 2010-02-02 | Discharge: 2010-02-02 | Payer: Self-pay | Admitting: Emergency Medicine

## 2010-02-03 LAB — CONVERTED CEMR LAB: INR: 1.48 (ref <1.50)

## 2010-02-16 ENCOUNTER — Telehealth (INDEPENDENT_AMBULATORY_CARE_PROVIDER_SITE_OTHER): Payer: Self-pay | Admitting: Internal Medicine

## 2010-02-22 ENCOUNTER — Encounter: Payer: Self-pay | Admitting: Physician Assistant

## 2010-02-22 ENCOUNTER — Ambulatory Visit: Payer: Self-pay | Admitting: Family Medicine

## 2010-02-22 LAB — CONVERTED CEMR LAB
HCV Ab: NEGATIVE
Hepatitis B Surface Ag: NEGATIVE
Pap Smear: NEGATIVE

## 2010-02-23 ENCOUNTER — Telehealth (INDEPENDENT_AMBULATORY_CARE_PROVIDER_SITE_OTHER): Payer: Self-pay | Admitting: Internal Medicine

## 2010-03-05 ENCOUNTER — Ambulatory Visit: Payer: Self-pay | Admitting: Internal Medicine

## 2010-03-05 DIAGNOSIS — J309 Allergic rhinitis, unspecified: Secondary | ICD-10-CM | POA: Insufficient documentation

## 2010-03-10 LAB — CONVERTED CEMR LAB
AST: 20 units/L (ref 0–37)
Alkaline Phosphatase: 95 units/L (ref 39–117)
BUN: 13 mg/dL (ref 6–23)
Basophils Absolute: 0 10*3/uL (ref 0.0–0.1)
Basophils Relative: 0 % (ref 0–1)
Glucose, Bld: 95 mg/dL (ref 70–99)
Hemoglobin: 13.5 g/dL (ref 12.0–15.0)
Lymphocytes Relative: 22 % (ref 12–46)
MCHC: 33.2 g/dL (ref 30.0–36.0)
Monocytes Absolute: 0.5 10*3/uL (ref 0.1–1.0)
Neutro Abs: 5.1 10*3/uL (ref 1.7–7.7)
Platelets: 261 10*3/uL (ref 150–400)
Potassium: 3.9 meq/L (ref 3.5–5.3)
Prothrombin Time: 20.3 s — ABNORMAL HIGH (ref 11.6–15.2)
RDW: 15.1 % (ref 11.5–15.5)
Sodium: 141 meq/L (ref 135–145)
Total Bilirubin: 0.5 mg/dL (ref 0.3–1.2)
Total Protein: 6.7 g/dL (ref 6.0–8.3)

## 2010-03-25 ENCOUNTER — Ambulatory Visit: Payer: Self-pay | Admitting: Internal Medicine

## 2010-03-26 ENCOUNTER — Encounter (INDEPENDENT_AMBULATORY_CARE_PROVIDER_SITE_OTHER): Payer: Self-pay | Admitting: Internal Medicine

## 2010-03-29 LAB — CONVERTED CEMR LAB: INR: 2.26 — ABNORMAL HIGH (ref <1.50)

## 2010-03-30 ENCOUNTER — Telehealth (INDEPENDENT_AMBULATORY_CARE_PROVIDER_SITE_OTHER): Payer: Self-pay | Admitting: Internal Medicine

## 2010-04-26 ENCOUNTER — Encounter (INDEPENDENT_AMBULATORY_CARE_PROVIDER_SITE_OTHER): Payer: Self-pay | Admitting: Internal Medicine

## 2010-04-26 ENCOUNTER — Ambulatory Visit: Payer: Self-pay | Admitting: Internal Medicine

## 2010-04-30 ENCOUNTER — Telehealth (INDEPENDENT_AMBULATORY_CARE_PROVIDER_SITE_OTHER): Payer: Self-pay | Admitting: Internal Medicine

## 2010-04-30 LAB — CONVERTED CEMR LAB: Prothrombin Time: 17.6 s — ABNORMAL HIGH (ref 11.6–15.2)

## 2010-05-12 ENCOUNTER — Ambulatory Visit: Payer: Self-pay | Admitting: Internal Medicine

## 2010-05-12 ENCOUNTER — Encounter (INDEPENDENT_AMBULATORY_CARE_PROVIDER_SITE_OTHER): Payer: Self-pay | Admitting: Internal Medicine

## 2010-05-13 LAB — CONVERTED CEMR LAB
INR: 1.44 (ref <1.50)
Prothrombin Time: 17.7 s — ABNORMAL HIGH (ref 11.6–15.2)

## 2010-06-15 ENCOUNTER — Encounter (INDEPENDENT_AMBULATORY_CARE_PROVIDER_SITE_OTHER): Payer: Self-pay | Admitting: Internal Medicine

## 2010-06-17 NOTE — Progress Notes (Signed)
Summary: REFILL ON COUMADIN--FYI Alice [name]  Phone Note Call from Patient Call back at [facility] Phone [phone]   Summary of Call: Alice [name] PT. PATIENT STILL WAITING ON COUMADIN RESULTS FROM AUGUST 31, AND REFILL ON COUMADIN Initial call taken by: Roberto Scales,  February 23, 2010 11:15 AM  Follow-up for Phone Call        Needs protime in 2 weeks.  Please make sure we have a good phone number as we have left many messages to follow up on protime.   Please make sure we have the correct dosing:  10 mg on M-Thurs and 2 mg (not 5 mg) on Friday and Saturday. Please remind her that she needs to come in once a month for protimes no matter what--I have asked her to schedule a repeat protime in one month every time she comes in for a protime(this has been discussed with her on many occasions) Let her know her Coumadin has been filled. Follow-up by: Mack Hook MD,  February 23, 2010 1:17 PM  Additional Follow-up for Phone Call Additional follow up Details #1::        left message to return call @ [phone].Marland KitchenMordecai Maes CMA  February 25, 2010 8:55 AM  left message to return call [phone].Marland KitchenMordecai Maes CMA  February 25, 2010 8:55 AM    spoke with patient and she now says that she does not need any refills and that she needed her results only because she was told her level was low. Advised patient to keep appt for next Friday and she can address with provider at that time.Marland KitchenMarland KitchenMarland KitchenMordecai Maes CMA  February 25, 2010 10:14 AM     New/Updated Medications: COUMADIN 2 MG TABS (WARFARIN SODIUM) 1 tab by mouth daily on Friday and Saturday Prescriptions: COUMADIN 2 MG TABS (WARFARIN SODIUM) 1 tab by mouth daily on Friday and Saturday  #30 x 6   Entered and Authorized by:   Mack Hook MD   Signed by:   Mack Hook MD on 02/23/2010   Method used:   Electronically to        [facility] (retail)       [place], [place]  [address]       Ph: [phone]       Fax: [phone]   RxID:   [id number] COUMADIN 10 MG TABS (WARFARIN SODIUM) 1 tab by mouth daily Sunday through Thursday  #30 x 6   Entered and Authorized by:   Elizabeth Mulberry MD   Signed by:   Elizabeth Mulberry MD on 02/23/2010   Method used:   Electronically to        [facility] [address] (retail)       [address]       [place], [place]  [address]       Ph: [phone]       Fax: [phone]   RxID:   [id number]

## 2010-06-17 NOTE — Progress Notes (Signed)
Summary: bp meds were not called in  Phone Note Call from Patient Call back at Home Phone [phone]   Reason for Call: Refill Medication Summary of Call: mulberry pt. Alice [name] is at [facility] now trying to get a refill on her Amlodopine filled. She says she was told by Nicki Reaper that he told her to take 2 of her 5mg  pressure pills to make 10mg . and she was told by someone to that they were sending it in right before she left from here. Initial call taken by: Roberto Scales,  June 19, 2009 4:42 PM  Follow-up for Phone Call        Increased dose of amlodipine called to [facility]. Follow-up by: Shellia Carwin CMA,  June 19, 2009 4:46 PM    New/Updated Medications: AMLODIPINE BESYLATE 10 MG TABS (AMLODIPINE BESYLATE) 1 by mouth once daily

## 2010-06-17 NOTE — Progress Notes (Signed)
Summary: Query:  Refill HCTZ?  Phone Note Outgoing Call   Summary of Call: Do you want to refill her HCTZ?  Last seen 06/2009. Initial call taken by: Sherian Maroon RN,  February 16, 2010 3:57 PM  Follow-up for Phone Call        Darrell Jewel until 06/2010. Please have her come in ASAP for Protime. Make sure she has not missed her Coumadin Follow-up by: Mack Hook MD,  February 22, 2010 10:20 AM  Additional Follow-up for Phone Call Additional follow up Details #1::        Spoke with pt. -- advised of refill.  Made appt. for lab/provider 03/05/10.  Sherian Maroon RN  February 23, 2010 11:50 AM

## 2010-06-17 NOTE — Assessment & Plan Note (Signed)
Summary: f/u in 2 weeks/ bmet/pt inr/ bp check//gk  Nurse Visit   Vital Signs:  Patient profile:   40 year old female BP sitting:   135 / 84  (left arm) Cuff size:   regular  Vitals Entered By: Everett (July 23, 2009 11:25 AM)  Allergies: 1)  ! Oxycodone Hcl (Oxycodone Hcl)  Orders Added: 1)  Est. Patient Level I [id number] 2)  T-Basic Metabolic Panel 0000000 3)  T-Protime, Auto [id number]

## 2010-06-17 NOTE — Letter (Signed)
Summary: NUTRITION & DIABETES/NO SHOWED  NUTRITION & DIABETES/NO SHOWED   Imported By: Roland Earl 01/21/2010 15:56:13  _____________________________________________________________________  External Attachment:    Type:   Image     Comment:   External Document

## 2010-06-17 NOTE — Assessment & Plan Note (Signed)
Summary: f/u bp and draw blood /tmm   Vital Signs:  Patient profile:   40 year old female Weight:      327 pounds BMI:     49.90 Temp:     98.6 degrees F Pulse rate:   86 / minute Pulse rhythm:   regular Resp:     18 per minute BP sitting:   157 / 109  (left arm) Cuff size:   large  Vitals Entered By: Shellia Carwin CMA (July 09, 2009 10:25 AM) CC: f/u bp takes bp med at night and lab work Is Patient Diabetic? No Pain Assessment Patient in pain? no       Does patient need assistance? Ambulation Normal   Primary Care Provider:  Adrian Prows MD  CC:  f/u bp takes bp med at night and lab work.  History of Present Illness: 1.  Anticoagulation:  already had PT drawn--was adequately anticoagulated last month.  Pt. not compliant with regular checks.  No problems with bleeding.  Still taking 10 mg of Coumadin on S-F and 5 mg on Saturday.  2.  Right leg ulcer:  failed the skin graft some time ago (again)  But now close to being healed.  Wrap soon to come off.  Goes in weekly to Ortho to  have a check.  3.  Hypertension:  Never misses Amlodipine.  Pt. feels her bp is up secondary to stress.  Not interested in working with a counselor to help this out--feels she can solve on her own.  Not working out as she was previously.  Would like to go back to [employer] nutrition to work on diet and weight loss.  Pt admits she had been taking 10 mg of Amlodipine at the recommendations of ortho for some time before we increased.  BP still up.  Allergies (verified): 1)  ! Oxycodone Hcl (Oxycodone Hcl)  Physical Exam  General:  obese, NAD--Looks well! Lungs:  Normal respiratory effort, chest expands symmetrically. Lungs are clear to auscultation, no crackles or wheezes. Heart:  Normal rate and regular rhythm. S1 and S2 normal without gallop, murmur, click, rub or other extra sounds.  Radial pulses normal and equal   Impression & Recommendations:  Problem # 1:  HYPERTENSION  (ICD-401.9) ADD HCTZ Her updated medication list for this problem includes:    Amlodipine Besylate 10 Mg Tabs (Amlodipine besylate) ..... One tablet by mouth daily for blood pressure    Hydrochlorothiazide 25 Mg Tabs (Hydrochlorothiazide) .Marland Kitchen... 1 tab by mouth daily for hypertension  Problem # 2:  Hx of PULMONARY EMBOLISM (ICD-415.19)  protime today Her updated medication list for this problem includes:    Coumadin 5 Mg Tabs (Warfarin sodium) .Marland Kitchen... 1 tab by mouth on saturdays.    Coumadin 10 Mg Tabs (Warfarin sodium) .Marland Kitchen... 1 tab by mouth daily sunday through friday  Orders: T-Protime, Auto [phone])  Complete Medication List: 1)  Multiple Vitamin Tabs (Multiple vitamin) .... Once daily 2)  Lyrica 100 Mg Caps (Pregabalin) .... Take 1 tablet by mouth three times a day 3)  Calcium 500 500 Mg Tabs (Calcium carbonate) .Marland Kitchen.. 1 tab by mouth three times a day 4)  Ferrous Sulfate 325 (65 Fe) Mg Tbec (Ferrous sulfate) .Marland Kitchen.. 1 tab by mouth daily 5)  Mirena 20 Mcg/24hr Iud (Levonorgestrel) 6)  Amlodipine Besylate 10 Mg Tabs (Amlodipine besylate) .... One tablet by mouth daily for blood pressure 7)  Curity Sterile Saline 0.9 % Soln (Sodium chloride (gu irrigant)) .... Soak 4x4 gauze  pads and ring out--place against wound 8)  4x4 Gauze Pads  .... Soak as needed for two times a day dressing changes. 9)  Kerlex  .... Wrap leg to secure saline soaked gauze with two times a day dressing changes 10)  Coumadin 5 Mg Tabs (Warfarin sodium) .Marland Kitchen.. 1 tab by mouth on saturdays. 11)  Coumadin 10 Mg Tabs (Warfarin sodium) .Marland Kitchen.. 1 tab by mouth daily sunday through friday 12)  Mirena 20 Mcg/24hr Iud (Levonorgestrel) 13)  Hydrochlorothiazide 25 Mg Tabs (Hydrochlorothiazide) .... 1 tab by mouth daily for hypertension  Other Orders: Nutrition Referral (Nutrition) T-CBC w/Diff (850[address]10)  Patient Instructions: 1)  Lab visit for bp check and BMET in 2 weeks 2)  Lab visit for protime in 1 month 3)  Follow up  with Dr. Faris Coolman in 3 months  Prescriptions: AMLODIPINE BESYLATE 10 MG TABS (AMLODIPINE BESYLATE) One tablet by mouth daily for blood pressure  #30 x 11   Entered and Authorized by:   Dayane Hillenburg MD   Signed by:   Mandeep Ferch MD on 07/09/2009   Method used:   Electronically to        [facility] [address] (retail)       [address]       [place]       [place], [place]  [address]       Ph: [phone]       Fax: [phone]   RxID:   [id number] HYDROCHLOROTHIAZIDE 25 MG TABS (HYDROCHLOROTHIAZIDE) 1 tab by mouth daily for hypertension  #30 x 6   Entered and Authorized by:   Marshal Schrecengost MD   Signed by:   Clydean Posas MD on 07/09/2009   Method used:   Electronically to        [facility] [address] (retail)       [address]       [place]       [place], [place]  [address]       Ph: [phone]       Fax: [phone]   RxID:   [id number]      Vital Signs:  Patient profile:   40 year old female Weight:      327 pounds BMI:     49.90 Temp:     98.6 degrees F Pulse rate:   86 / minute Pulse rhythm:   regular Resp:     18  per minute BP sitting:   157 / 109  (left arm) Cuff size:   large  Vitals Entered By: Shellia Carwin CMA (July 09, 2009 10:25 AM)   Appended Document: f/u bp and draw blood /tmm please add a protime to labs this coming Thursday.

## 2010-06-17 NOTE — Assessment & Plan Note (Signed)
Summary: BP 160/100//[facility] CALLED IN//[facility]  Nurse Visit   Vital Signs:  Patient profile:   40 year old female Pulse rate:   88 / minute Pulse rhythm:   regular BP sitting:   149 / 99  (left arm) Cuff size:   large  Vitals Entered By: [place] Shannon (June 10, 2009 2:16 PM) Comments Increase Amlodipine to 2 pill a day call in refill to [facility]   Allergies: 1)  ! Oxycodone Hcl (Oxycodone Hcl)

## 2010-06-17 NOTE — Progress Notes (Signed)
Summary: coumadin  Phone Note Call from Patient Call back at [facility] Phone [phone]   Summary of Call: the pt states that she needs refills from her coumadin by today because she is out of refills. [facility] [address] Amil Amen MD Initial call taken by: Alexis Goodell,  Oct 09, 2009 4:40 PM  Follow-up for Phone Call        Fwd to Dr. Amil Amen for lab review and refill auth. Follow-up by: Shellia Carwin CMA,  Oct 09, 2009 4:41 PM  Additional Follow-up for Phone Call Additional follow up Details #1::        Needs to stay on same dosing with recent protime --should have refills available--need to call pharmacy. Recheck protime in 1 month  T.  McCoy notified pt. of above Additional Follow-up by: Mack Hook MD,  Oct 09, 2009 6:09 PM

## 2010-06-17 NOTE — Progress Notes (Signed)
  Phone Note Call from Patient   Caller: Patient Summary of Call: Previously called pt. regarding low protime. She states she told office staff that she was not allowed to fill her Coumadin and was not taking full dose just before her protime. She states she is almost out of Amlodipine and refills of current Coumadin--which does not make sense as filled for several months just recently.   Called pharmacy to clarify. They have been filling old prescriptions and have her new Rx of above on file. Pt. previously told us the pharmacy told her she was out of old Rx refills. Asked pharmacy to cancel out old Rx refills and substitute with most recent Rx in future. Pharmacy also gave history of pt. not picking up her prescriptions in August and filling late in October, no fill in November--just filled 12/6. Please call pt. and set her up with repeat protime the week after Christmas. She should now be taking 4 mg on Friday and Saturday(we had her taking just 2 mg on Saturday, but she is insistent she has been taking 4 mg on Saturday) and 10 mg all other days of week.  Initial call taken by: Mack Hook MD,  April 30, 2010 9:39 AM  Follow-up for Phone Call        pt is aware of dosage and has appt 05-12-10.Marland KitchenMarland KitchenThailand Shannon  April 30, 2010 11:47 AM

## 2010-06-17 NOTE — Progress Notes (Signed)
Summary: Needs Coumadin refills  Phone Note Refill Request   Refills Requested: Medication #1:  COUMADIN 2 MG TABS 2 tabs by mouth daily on Friday and 1 tab by mouth on Saturday [facility] [address]  Initial call taken by: [place] Shannon,  March 30, 2010 3:09 PM  Follow-up for Phone Call        dr. Amil Amen took care of the script already Follow-up by: [place] Shannon,  March 30, 2010 3:24 PM

## 2010-06-17 NOTE — Assessment & Plan Note (Signed)
Summary: OV/COUMADIN///KT   Vital Signs:  Patient profile:   40 year old female Menstrual status:  regular LMP:     02/08/2010 Weight:      336.9 pounds Temp:     99.0 degrees F oral Resp:     18 per minute BP sitting:   138 / 92  (left arm) Cuff size:   regular  Vitals Entered By: Rhunette Croft (March 05, 2010 11:48 AM) CC: CONCERNS WITH PROVIDER ASSIGNMENT Is Patient Diabetic? No Pain Assessment Patient in pain? no      LMP (date): 02/08/2010     Menstrual Status regular Enter LMP: 02/08/2010 Last PAP Result NEGATIVE FOR INTRAEPITHELIAL LESIONS OR MALIGNANCY.   Primary Care Provider:  Adrian Prows MD  CC:  CONCERNS WITH PROVIDER ASSIGNMENT.  History of Present Illness: 1.  Hypertension:  Taking Tylenol Sinus Cold remedy.  Initially states taking her bp med, but later find out she has been out of HCTZ.    2.  Sinus congestion--mild.  Has Claritin which helps.  Gets this with weather changes--especially when keeps going back and forth with heat and cold.    3.  Anticoagulation:  Pt. states she did not receive our message last month regarding protime.  Pt. with hx of noncompliance.    4.  Swelling of LE:  of HCTZ.  5.  Health Maintenance:  gets yearly pap done at [facility]--had done recently.  States was diagnosed with Chlamydia last year.  STD work up done again this year.  Allergies: 1)  ! Oxycodone Hcl (Oxycodone Hcl)  Physical Exam  General:  MOrbildy obese, NAD Nose:  Nasal mucosa swollen with clear discharge Mouth:  pharynx pink and moist.   Neck:  No deformities, masses, or tenderness noted. Lungs:  Normal respiratory effort, chest expands symmetrically. Lungs are clear to auscultation, no crackles or wheezes. Heart:  Normal rate and regular rhythm. S1 and S2 normal without gallop, murmur, click, rub or other extra sounds.  Radial pulses normal and equal Extremities:  Edema of left LE below knee--moderate.  Right leg wrapped--recent skin  graft   Impression & Recommendations:  Problem # 1:  Preventive Health Care (ICD-V70.0) Tdap and flu vaccines today Gets gynecological care at [facility]  Problem # 2:  HYPERTENSION (ICD-401.9)  Not on HCTZ currently--restart Her updated medication list for this problem includes:    Amlodipine Besylate 10 Mg Tabs (Amlodipine besylate) ..... One tablet by mouth daily for blood pressure    Hydrochlorothiazide 25 Mg Tabs (Hydrochlorothiazide) .Marland Kitchen... 1 tab by mouth daily for hypertension  Orders: UA Dipstick w/o Micro (manual) (81002)  Problem # 3:  ALLERGIC RHINITIS (ICD-477.9) Start Fluticasone On Claritin Her updated medication list for this problem includes:    Fluticasone Propionate 50 Mcg/act Susp (Fluticasone propionate) .Marland Kitchen... 2 sprays each nostril daily  Problem # 4:  Hx of PULMONARY EMBOLISM (ICD-415.48) Long discussion requesting pt. to be more proactive--again discussed she needs to have her protime at least once monthly.   Her updated medication list for this problem includes:    Coumadin 2 Mg Tabs (Warfarin sodium) .Marland Kitchen... 1 tab by mouth daily on friday and saturday    Coumadin 10 Mg Tabs (Warfarin sodium) .Marland Kitchen... 1 tab by mouth daily sunday through thursday    Coumadin 2 Mg Tabs (Warfarin sodium) .Marland Kitchen... 1 by mouth q friday and saturday  Orders: T-CBC w/Diff [id number]) T-Protime, Auto [id number])  Complete Medication List: 1)  Multiple Vitamin Tabs (Multiple vitamin) .... Once daily 2)  Calcium 500 500 Mg Tabs (Calcium carbonate) .Marland Kitchen.. 1 tab by mouth three times a day 3)  Ferrous Sulfate 325 (65 Fe) Mg Tbec (Ferrous sulfate) .Marland Kitchen.. 1 tab by mouth daily 4)  Mirena 20 Mcg/24hr Iud (Levonorgestrel) 5)  Amlodipine Besylate 10 Mg Tabs (Amlodipine besylate) .... One tablet by mouth daily for blood pressure 6)  Coumadin 2 Mg Tabs (Warfarin sodium) .Marland Kitchen.. 1 tab by mouth daily on friday and saturday 7)  Coumadin 10 Mg Tabs (Warfarin sodium) .Marland Kitchen.. 1 tab by mouth daily sunday  through thursday 8)  Hydrochlorothiazide 25 Mg Tabs (Hydrochlorothiazide) .... 1 tab by mouth daily for hypertension 9)  Coumadin 2 Mg Tabs (Warfarin sodium) .... 1 by mouth q friday and saturday 10)  Fluticasone Propionate 50 Mcg/act Susp (Fluticasone propionate) .... 2 sprays each nostril daily  Other Orders: T-Comprehensive Metabolic Panel ([id number])  Patient Instructions: 1)  lab visit for bp check and protime in 1 month 2)  Follow up with Dr. Nolie Bignell in 6 months  Prescriptions: HYDROCHLOROTHIAZIDE 25 MG TABS (HYDROCHLOROTHIAZIDE) 1 tab by mouth daily for hypertension  #30 Tablet x 11   Entered and Authorized by:   Maudean Hoffmann MD   Signed by:   Kwana Ringel MD on 03/05/2010   Method used:   Faxed to ...       [facility] [address] (retail)       [address]       [place]       [place], [place]  [address]       Ph: [phone]       Fax: [phone]   RxID:   [id number] AMLODIPINE BESYLATE 10 MG TABS (AMLODIPINE BESYLATE) One tablet by mouth daily for blood pressure  #30 x 11   Entered and Authorized by:   Petros Ahart MD   Signed by:   Harvest Stanco MD on 03/05/2010   Method used:   Electronically to        [facility] [address] (retail)       [address]       [place]       [place], [place]  [address]       Ph: [phone]       Fax: [phone]   RxID:   [id number] FLUTICASONE PROPIONATE 50 MCG/ACT SUSP (FLUTICASONE PROPIONATE) 2 sprays each nostril daily  #1 x 6   Entered and Authorized by:   Javian Nudd MD   Signed by:   Jini Horiuchi MD on 03/05/2010   Method used:   Electronically to        [facility] [address] (retail)       [address] [place], [place]  [address]       Ph: [phone]       Fax: [phone]   RxID:   [id number]    Orders Added: 1)  T-CBC w/Diff [id number] 2)  T-Comprehensive Metabolic Panel [id number] 3)  Est.  Patient Level IV [id number] 4)  UA Dipstick w/o Micro (manual) [81002] 5)  T-Protime, Auto [id number]      Appended Document: OV/COUMADIN///KT    Clinical Lists Changes  Orders: Added new Service order of Tdap => 26yrs IM [id number]) - Signed Added new Service order of Admin 1st Vaccine [phone]) - Signed Added new Service order of Admin 1st Vaccine [facility]) [phone]) - Signed Added new Service order  of Influenza Vaccine NON MCR [phone]) - Signed Observations: Added new observation of FLU VAX#1VIS: 12/08/09 version given March 05, 2010. (03/05/2010 15:10) Added new observation of FLU VAXLOT: [id number] (03/05/2010 15:10) Added new observation of FLU VAX EXP: 11/13/2010 (03/05/2010 15:10) Added new observation of FLU VAXBY: Chantel Miller (03/05/2010 15:10) Added new observation of FLU VAXRTE: IM (03/05/2010 15:10) Added new observation of FLU VAX DSE: 0.5 ml (03/05/2010 15:10) Added new observation of FLU VAXMFR: [employer] (03/05/2010 15:10) Added new observation of FLU VAX SITE: right deltoid (03/05/2010 15:10) Added new observation of FLU VAX: Fluvax Non-MCR (03/05/2010 15:10) Added new observation of TD BOOST VIS: 04/02/08 version given March 05, 2010. (03/05/2010 15:10) Added new observation of TD BOOSTERLO: [id number] (03/05/2010 15:10) Added new observation of TD BOOST EXP: 06/02/2012 (03/05/2010 15:10) Added new observation of TD BOOSTERBY: Chantel Miller (03/05/2010 15:10) Added new observation of TD BOOSTERRT: IM (03/05/2010 15:10) Added new observation of TDBOOSTERDSE: 0.5 ml (03/05/2010 15:10) Added new observation of TD BOOSTERMF: [place] (03/05/2010 15:10) Added new observation of TD BOOST SIT: right deltoid (03/05/2010 15:10) Added new observation of TD BOOSTER: Tdap (03/05/2010 15:10)       Tetanus/Td Vaccine    Vaccine Type: Tdap    Site: right deltoid    Mfr: [place]    Dose: 0.5 ml    Route: IM    Given by: Rhunette Croft    Exp. Date:  06/02/2012    Lot #: [id number]    VIS given: 04/02/08 version given March 05, 2010.  Influenza Vaccine    Vaccine Type: Fluvax Non-MCR    Site: right deltoid    Mfr: GlaxoSmithKline    Dose: 0.5 ml    Route: IM    Given by: Rhunette Croft    Exp. Date: 11/13/2010    Lot #: [id number]    VIS given: 12/08/09 version given March 05, 2010.  Flu Vaccine Consent Questions    Do you have a history of severe allergic reactions to this vaccine? no    Any prior history of allergic reactions to egg and/or gelatin? no    Do you have a sensitivity to the preservative Thimersol? no    Do you have a past history of Guillan-Barre Syndrome? no    Do you currently have an acute febrile illness? no    Have you ever had a severe reaction to latex? no    Vaccine information given and explained to patient? yes    Are you currently pregnant? no

## 2010-06-17 NOTE — Progress Notes (Signed)
Summary: Refills Request  Phone Note Call from Patient   Summary of Call: The pt needs more refills from amilodipine medication.  Pt states that the pharmacy sent the request two days ago.  [place] Richardo Priest MD Initial call taken by: Alexis Goodell,  October 23, 2009 2:34 PM  Follow-up for Phone Call        Pt. notified Rx sent to [facility]ia RN 10/26/09 1:00 pm    Prescriptions: AMLODIPINE BESYLATE 10 MG TABS (AMLODIPINE BESYLATE) One tablet by mouth daily for blood pressure  #30 x 5   Entered and Authorized by:   Richardson Dopp PA-C   Signed by:   Richardson Dopp PA-C on 10/23/2009   Method used:   Electronically to        [facility] (retail)       [address] [place], [place]  [address]       Ph: [phone]       Fax: [phone]   RxID:   [id number]

## 2010-06-29 LAB — CONVERTED CEMR LAB: Prothrombin Time: 21.8 s — ABNORMAL HIGH (ref 11.6–15.2)

## 2010-07-06 ENCOUNTER — Encounter (INDEPENDENT_AMBULATORY_CARE_PROVIDER_SITE_OTHER): Payer: Self-pay | Admitting: Internal Medicine

## 2010-07-06 LAB — CONVERTED CEMR LAB
INR: 1.53 — ABNORMAL HIGH (ref <1.50)
Prothrombin Time: 18.6 s — ABNORMAL HIGH (ref 11.6–15.2)

## 2010-07-13 NOTE — Assessment & Plan Note (Addendum)
Summary: Repeat Protime   Vitals Entered By: Aquilla Solian CMA (July 06, 2010 2:41 PM) CC: Pt. is taking Coumadin 2mg  twice on Friday and Saturday and Coumadin 10mg  Sun - Thursday. Has been taking it for a week now.    CC:  Pt. is taking Coumadin 2mg  twice on Friday and Saturday and Coumadin 10mg  Sun - Thursday. Has been taking it for a week now. .  Allergies: 1)  ! Oxycodone Hcl (Oxycodone Hcl)   Complete Medication List: 1)  Multiple Vitamin Tabs (Multiple vitamin) .... Once daily 2)  Calcium 500 500 Mg Tabs (Calcium carbonate) .Marland Kitchen.. 1 tab by mouth three times a day 3)  Ferrous Sulfate 325 (65 Fe) Mg Tbec (Ferrous sulfate) .Marland Kitchen.. 1 tab by mouth daily 4)  Mirena 20 Mcg/24hr Iud (Levonorgestrel) 5)  Amlodipine Besylate 10 Mg Tabs (Amlodipine besylate) .... One tablet by mouth daily for blood pressure 6)  Coumadin 2 Mg Tabs (Warfarin sodium) .... 2 tabs by mouth daily on friday and 2 tabs by mouth on saturday 7)  Coumadin 10 Mg Tabs (Warfarin sodium) .Marland Kitchen.. 1 tab by mouth daily sunday through thursday 8)  Hydrochlorothiazide 25 Mg Tabs (Hydrochlorothiazide) .Marland Kitchen.. 1 tab by mouth daily for hypertension 9)  Coumadin 2 Mg Tabs (Warfarin sodium) .Marland Kitchen.. 1 by mouth q friday and saturday 10)  Fluticasone Propionate 50 Mcg/act Susp (Fluticasone propionate) .... 2 sprays each nostril daily  Other Orders: T-Protime, Auto [id number])   Orders Added: 1)  Est. Patient Level I [id number] 2)  T-Protime, Auto [id number]

## 2010-07-14 ENCOUNTER — Encounter (HOSPITAL_COMMUNITY)
Admission: RE | Admit: 2010-07-14 | Discharge: 2010-07-14 | Disposition: A | Discharge status: Home / Self Care | Payer: Medicare Other | Source: Ambulatory Visit | Attending: Orthopedic Surgery | Admitting: Orthopedic Surgery

## 2010-07-14 DIAGNOSIS — Z01812 Encounter for preprocedural laboratory examination: Secondary | ICD-10-CM | POA: Insufficient documentation

## 2010-07-14 LAB — CBC
MCHC: 34.3 g/dL (ref 30.0–36.0)
Platelets: 282 10*3/uL (ref 150–400)
RDW: 14.3 % (ref 11.5–15.5)

## 2010-07-14 LAB — COMPREHENSIVE METABOLIC PANEL
ALT: 18 U/L (ref 0–35)
AST: 25 U/L (ref 0–37)
Calcium: 9.2 mg/dL (ref 8.4–10.5)
GFR calc Af Amer: >60 mL/min (ref >60)
Sodium: 137 mEq/L (ref 135–145)
Total Protein: 7 g/dL (ref 6.0–8.3)

## 2010-07-14 LAB — PROTIME-INR: Prothrombin Time: 21 seconds — ABNORMAL HIGH (ref 11.6–15.2)

## 2010-07-14 LAB — SURGICAL PCR SCREEN
MRSA, PCR: NEGATIVE
Staphylococcus aureus: NEGATIVE

## 2010-07-15 HISTORY — PX: INCISION AND DRAINAGE: SHX5863

## 2010-07-16 ENCOUNTER — Inpatient Hospital Stay (HOSPITAL_COMMUNITY)
Admission: RE | Admit: 2010-07-16 | Discharge: 2010-07-19 | DRG: 264 | Disposition: A | Discharge status: Home / Self Care | Payer: Medicare Other | Source: Ambulatory Visit | Attending: Orthopedic Surgery | Admitting: Orthopedic Surgery

## 2010-07-16 DIAGNOSIS — L97209 Non-pressure chronic ulcer of unspecified calf with unspecified severity: Secondary | ICD-10-CM | POA: Diagnosis present

## 2010-07-16 DIAGNOSIS — I872 Venous insufficiency (chronic) (peripheral): Principal | ICD-10-CM | POA: Diagnosis present

## 2010-07-16 DIAGNOSIS — Z9884 Bariatric surgery status: Secondary | ICD-10-CM

## 2010-07-16 DIAGNOSIS — G4733 Obstructive sleep apnea (adult) (pediatric): Secondary | ICD-10-CM | POA: Diagnosis present

## 2010-07-16 DIAGNOSIS — Z86711 Personal history of pulmonary embolism: Secondary | ICD-10-CM

## 2010-07-16 DIAGNOSIS — I70299 Other atherosclerosis of native arteries of extremities, unspecified extremity: Secondary | ICD-10-CM | POA: Diagnosis present

## 2010-07-16 DIAGNOSIS — I10 Essential (primary) hypertension: Secondary | ICD-10-CM | POA: Diagnosis present

## 2010-07-16 DIAGNOSIS — Z7901 Long term (current) use of anticoagulants: Secondary | ICD-10-CM

## 2010-07-16 DIAGNOSIS — E669 Obesity, unspecified: Secondary | ICD-10-CM | POA: Diagnosis present

## 2010-07-17 LAB — PROTIME-INR: INR: 2.14 — ABNORMAL HIGH (ref 0.00–1.49)

## 2010-07-19 LAB — PROTIME-INR
INR: 1.95 — ABNORMAL HIGH (ref 0.00–1.49)
Prothrombin Time: 22.4 seconds — ABNORMAL HIGH (ref 11.6–15.2)

## 2010-07-29 LAB — BASIC METABOLIC PANEL
BUN: 16 mg/dL (ref 6–23)
Calcium: 8.8 mg/dL (ref 8.4–10.5)
Creatinine, Ser: 1.2 mg/dL (ref 0.4–1.2)
GFR calc non Af Amer: 50 mL/min — ABNORMAL LOW (ref >60)
Potassium: 3.4 mEq/L — ABNORMAL LOW (ref 3.5–5.1)

## 2010-07-29 LAB — CBC
HCT: 40.8 % (ref 36.0–46.0)
Platelets: 257 10*3/uL (ref 150–400)
RDW: 14.4 % (ref 11.5–15.5)
WBC: 6.3 10*3/uL (ref 4.0–10.5)

## 2010-07-29 LAB — DIFFERENTIAL
Basophils Absolute: 0 10*3/uL (ref 0.0–0.1)
Lymphocytes Relative: 29 % (ref 12–46)
Neutro Abs: 3.7 10*3/uL (ref 1.7–7.7)
Neutrophils Relative %: 59 % (ref 43–77)

## 2010-07-29 LAB — POCT CARDIAC MARKERS: Myoglobin, poc: 79.6 ng/mL (ref 12–200)

## 2010-07-29 LAB — PROTIME-INR
INR: 1.21 (ref 0.00–1.49)
Prothrombin Time: 15.5 seconds — ABNORMAL HIGH (ref 11.6–15.2)

## 2010-08-01 NOTE — Op Note (Signed)
  NAMELARSYN, LOSA NO.:  [id number]  MEDICAL RECORD NO.:  [id number]           PATIENT TYPE:  I  LOCATION:  [id number]                         FACILITY:  [facility]  PHYSICIAN:  [name], MD     DATE OF BIRTH:  October 25, 1970  DATE OF PROCEDURE: DATE OF DISCHARGE:                              OPERATIVE REPORT   PREOPERATIVE DIAGNOSIS:  Chronic venous stasis insufficiency ulcer wound, right medial calf.  POSTOPERATIVE DIAGNOSIS:  Chronic venous stasis insufficiency ulcer wound, right medial calf.  PROCEDURES:  Irrigation and debridement of skin and soft tissue of the right leg.  Apply ACell Xenograft 4 x 4 cm.  Apply wound VAC set at -75 mmHg.  Apply compressive wrap to the right lower extremity.  SURGEON:  [name], MD  ANESTHESIA:  General.  ESTIMATED BLOOD LOSS:  Minimal.  ANTIBIOTICS:  1 g of Kefzol.  DRAINS:  None.  COMPLICATIONS:  None.  DISPOSITION:  To PACU in stable condition.  INDICATIONS FOR PROCEDURE:  The patient is a 40 year old woman with a chronic venous stasis ulcer of the right leg.  She has undergone prolonged conservative treatment.  She has had an Apligraf, split- thickness skin graft, compressive wraps all without success.  She presents at this time for application of ACell Xenograft.  Risks and benefits were discussed including infection, neurovascular injury, nonhealing wound, need for additional surgery.  The patient states he understands and wished to proceed at this time.  DESCRIPTION OF PROCEDURE:  The patient was brought to OR room 10 and underwent a general anesthetic.  After adequate level of anesthesia was obtained, the patient's right lower extremity was prepped using DuraPrep and draped into a sterile field.  A 21-blade knife was used to debride the skin and soft tissue back to a bleeding viable granulating base with good petechial bleeding.  There is no abscess and no cellulitis.  No signs of infection.  The  Apligraf was then fenestrated after it was soaked.  This was applied with the Apligraf powder followed by the Apligraf 6-layer graft.  This was secured in place with staples, covered with Mepitel plus a wound VAC sponge and set to -75 mm of compression.  The leg was then wrapped with Webril and Coban to provide compressive wrap for the right lower extremity.  The patient was then extubated, taken to the PACU in stable condition.  Plan for admission and discharge on Monday.     [name], MD     MVD/MEDQ  D:  07/16/2010  T:  07/17/2010  Job:  [id number]  Electronically Signed by Meridee Score MD on 08/01/2010 04:30:56 PM

## 2010-08-01 NOTE — Discharge Summary (Signed)
  Alice [name], Alice [name] NO.:  [id number]  MEDICAL RECORD NO.:  [id number]           PATIENT TYPE:  I  LOCATION:  [id number]                         FACILITY:  [facility]  PHYSICIAN:  [name], MD     DATE OF BIRTH:  May 23, 1970  DATE OF ADMISSION:  07/16/2010 DATE OF DISCHARGE:  07/19/2010                              DISCHARGE SUMMARY   FINAL DIAGNOSIS:  Chronic venous stasis insufficiency ulcer wound, right lower extremity.  SURGICAL PROCEDURES:  Irrigation and debridement of skin, soft tissue, and muscle, apply ACell xenograft plus application of a wound VAC.  Discharged to home in stable condition.  Follow up with Dr. Sharol Given on Thursday or Friday.  Unna compressive wrap applied today.  Wound bed appears healthy and viable at time of discharge.  No change in her admission medications for her discharge medications.     [name], MD     MVD/MEDQ  D:  07/19/2010  T:  07/19/2010  Job:  [id number]  Electronically Signed by Meridee Score MD on 08/01/2010 04:30:52 PM

## 2010-08-06 ENCOUNTER — Encounter (INDEPENDENT_AMBULATORY_CARE_PROVIDER_SITE_OTHER): Payer: Self-pay | Admitting: Internal Medicine

## 2010-08-25 LAB — COMPREHENSIVE METABOLIC PANEL
AST: 32 U/L (ref 0–37)
Albumin: 3.4 g/dL — ABNORMAL LOW (ref 3.5–5.2)
BUN: 16 mg/dL (ref 6–23)
Calcium: 8.8 mg/dL (ref 8.4–10.5)
Creatinine, Ser: 1 mg/dL (ref 0.4–1.2)
GFR calc Af Amer: >60 mL/min (ref >60)
GFR calc non Af Amer: >60 mL/min (ref >60)

## 2010-08-25 LAB — APTT: aPTT: 33 seconds (ref 24–37)

## 2010-08-25 LAB — PROTIME-INR: INR: 2 — ABNORMAL HIGH (ref 0.00–1.49)

## 2010-08-25 LAB — CBC
HCT: 41.6 % (ref 36.0–46.0)
MCHC: 34.3 g/dL (ref 30.0–36.0)
MCV: 85 fL (ref 78.0–100.0)
Platelets: 243 10*3/uL (ref 150–400)

## 2010-09-23 ENCOUNTER — Emergency Department (HOSPITAL_COMMUNITY)
Admission: EM | Admit: 2010-09-23 | Discharge: 2010-09-24 | Disposition: A | Discharge status: Home / Self Care | Payer: Medicare Other | Attending: Emergency Medicine | Admitting: Emergency Medicine

## 2010-09-23 ENCOUNTER — Emergency Department (HOSPITAL_COMMUNITY): Payer: Medicare Other

## 2010-09-23 DIAGNOSIS — M545 Low back pain, unspecified: Secondary | ICD-10-CM | POA: Insufficient documentation

## 2010-09-23 DIAGNOSIS — S0990XA Unspecified injury of head, initial encounter: Secondary | ICD-10-CM | POA: Insufficient documentation

## 2010-09-23 DIAGNOSIS — M25519 Pain in unspecified shoulder: Secondary | ICD-10-CM | POA: Insufficient documentation

## 2010-09-23 DIAGNOSIS — I10 Essential (primary) hypertension: Secondary | ICD-10-CM | POA: Insufficient documentation

## 2010-09-23 DIAGNOSIS — Z79899 Other long term (current) drug therapy: Secondary | ICD-10-CM | POA: Insufficient documentation

## 2010-09-23 DIAGNOSIS — M546 Pain in thoracic spine: Secondary | ICD-10-CM | POA: Insufficient documentation

## 2010-09-23 DIAGNOSIS — Z7901 Long term (current) use of anticoagulants: Secondary | ICD-10-CM | POA: Insufficient documentation

## 2010-09-23 DIAGNOSIS — Z86718 Personal history of other venous thrombosis and embolism: Secondary | ICD-10-CM | POA: Insufficient documentation

## 2010-09-23 DIAGNOSIS — R51 Headache: Secondary | ICD-10-CM | POA: Insufficient documentation

## 2010-09-23 DIAGNOSIS — S139XXA Sprain of joints and ligaments of unspecified parts of neck, initial encounter: Secondary | ICD-10-CM | POA: Insufficient documentation

## 2010-09-23 DIAGNOSIS — G8929 Other chronic pain: Secondary | ICD-10-CM | POA: Insufficient documentation

## 2010-09-24 LAB — PROTIME-INR: Prothrombin Time: 22.9 seconds — ABNORMAL HIGH (ref 11.6–15.2)

## 2010-09-28 NOTE — Op Note (Signed)
NAMEMENNA, HLADKY                  ACCOUNT NO.:  [id number]   MEDICAL RECORD NO.:  [id number]          PATIENT TYPE:  AMB   LOCATION:  SDS                          FACILITY:  [facility]   PHYSICIAN:  [name], MD     DATE OF BIRTH:  1971/04/30   DATE OF PROCEDURE:  09/10/2008  DATE OF DISCHARGE:  09/10/2008                               OPERATIVE REPORT   PREOPERATIVE DIAGNOSIS:  Chronic venous stasis insufficiency ulceration,  right leg.   POSTOPERATIVE DIAGNOSIS:  Chronic venous stasis insufficiency  ulceration, right leg.   PROCEDURE:  Irrigation and debridement, skin and soft tissue and muscle  with the VersaJet and application of tissue meant 4 cm square.   SURGEON:  [name], MD   ANESTHESIA:  General.   ESTIMATED BLOOD LOSS:  Minimal.   ANTIBIOTICS:  1 g of Kefzol.   DRAINS:  None.   COMPLICATIONS:  None.   TOURNIQUET TIME:  None.   DISPOSITION:  To PACU in stable condition.   INDICATION OF PROCEDURE:  The patient is a 40 year old woman with  history of chronic venous stasis insufficiency ulceration.  She with  compressive wraps has decreased the wound to 4 x 4 cm and has stalled in  this healed position and presents at this time for surgical  intervention.  Risks and benefits were discussed including infection,  neurovascular injury, persistent wound, and need for additional surgery.  The patient states she understands and wished to proceed at this time.   DESCRIPTION OF PROCEDURE:  The patient brought to OR room 10 and  underwent general anesthetic.  After adequate level of anesthesia  obtained, the patient's right lower extremity was first scrubbed using  Betadine scrub, this was dried and draped, and then prepped with  DuraPrep and draped into a sterile field.  A sharp 10-blade knife was  used to first debride necrotic tissue from the surface of the wound.  The wound VersaJet was then used to pulsatile lavage and debride the  wound.  After the  patient had a 100% bleeding granulation tissue at the  base of the  wound, the tissue meant 4 cm x 4 cm was applied.  This was secured with  staples.  A Mepitel dressing plus 4 x 4s plus Webril and Coban was  applied.  The patient was extubated, taken to PACU in stable condition.  Plan for discharge to home.  Prescription for Vicodin for pain.  Follow  up in the office in 1 week.      [name], MD  Electronically Signed     MVD/MEDQ  D:  09/10/2008  T:  09/10/2008  Job:  [id number]

## 2010-09-28 NOTE — Op Note (Signed)
NAMEANNIEBELLE, [name]                  ACCOUNT NO.:  [id number]   MEDICAL RECORD NO.:  [id number]          PATIENT TYPE:  AMB   LOCATION:  SDS                          FACILITY:  [facility]   PHYSICIAN:  [name], MD     DATE OF BIRTH:  16-Jul-1970   DATE OF PROCEDURE:  04/30/2008  DATE OF DISCHARGE:                               OPERATIVE REPORT   PREOPERATIVE DIAGNOSIS:  Chronic venous stasis with the ulcer of right  leg.   POSTOPERATIVE DIAGNOSIS:  Chronic venous stasis with the ulcer of right  leg.   PROCEDURES:  1. Irrigation, debridement, and cleansing the skin.  2. Application of Apligraf.   SURGEON:  [name], MD   ANESTHESIA:  None.   DRAINS:  None.   COMPLICATIONS:  None.   DISPOSITION:  To home in stable condition.   INDICATIONS FOR PROCEDURE:  The patient is a 40 year old woman with  chronic venous stasis with the ulceration of the right leg.  She has  undergone a prolonged care.  The ulcer is almost completely healed;  however, it is stalled in its current location, and she presents for  application of Apligraf.  Risks and benefits were discussed.  The  patient states she wished to proceed.   DESCRIPTION OF PROCEDURE:  The patient was brought to Short-Stay, and  the compressive wrap was removed.  The wound was debrided, skin, soft  tissue, and nonviable tissue.  There was good bleeding of 100%  granulation tissue at the base of the wound.  The wound measures 3 cm in  diameter.  The Apligraf was fenestrated, applied to the wound.  Mepitel  dressing plus Bactroban plus an Unna boot was applied.  The patient was  then discharged to home with followup in the office in 1 week.      [name], MD  Electronically Signed     MVD/MEDQ  D:  04/30/2008  T:  04/30/2008  Job:  [id number]
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
## 2010-09-28 NOTE — Group Therapy Note (Signed)
NAME:  Alice [name], Alice [name] NO.:  [id number]   MEDICAL RECORD NO.:  [id number]          PATIENT TYPE:  WOC   LOCATION:  [facility]:  [facility]   PHYSICIAN:  Laray Anger, MD        DATE OF BIRTH:  [birth year]   DATE OF SERVICE:  01/10/2008                                  CLINIC NOTE   REASON FOR VISIT:  Annual well woman exam.   HISTORY OF PRESENT ILLNESS:  The patient presents today for annual woman  exam.  She has no significant complaints or concerns at this time.  Her  contraception is a Mirena IUD.   REVIEW OF SYSTEMS:  She is currently being treated for a right lower  extremity ulcer which required skin grafting in June of 2009.  During  that hospitalization, she developed a DVT and a pulmonary embolism.   PAST MEDICAL HISTORY:  1. Morbid obesity.  2. History of 2 episodes of DVTs.  3. Pulmonary embolism.   PAST SURGICAL HISTORY:  She had gastric bypass surgery in August of  2007.   ALLERGIES:  She has no known drug allergies.   OBSTETRICAL HISTORY:  None.  No previous pregnancies.   GYN HISTORY:  She has no previous gynecological problems.  She denies  having any abnormal Pap smears.   PHYSICAL EXAMINATION:  She has a blood pressure of 135/87, heart rate of  81, temperature 97.9.  Her weight is 306.9 pounds and her height is 5  foot, 8 inches.  NECK:  She has no thyromegaly, no lymphadenopathy.  LUNGS: Clear to auscultation.  CARDIOVASCULAR:  Regular, rate and rhythm, no murmur.  ABDOMEN:  Obese, soft, nontender with no rise to bowel sounds.  BREASTS:  Pendulous with normal fibrocystic tissue.  No discreet masses  or lymphadenopathy was noted.  GU EXAM:  Reveals normal external female genitalia.  She has 2 condyloma  located at the inferior aspect of her left buttock.  The vaginal mucosa  is normal without any discharge.  Her cervix is pink, nulliparous  without lesion.  She has no cervical motion tenderness.  The uterus and  adnexa were difficult to palpate due to the patient's body habitus.  A  Pap smear was performed today.   ASSESSMENT/PLAN:  Normal well woman exam today.  A Pap smear was done  today to include GC and Chlamydia testing.  The patient will continue  using her Mirena IUD.  She will follow up annually.  She will receive a  letter if the results are normal and a phone call if the results are  abnormal.     ______________________________  Hoover Browns, MD [facility]    ______________________________  Laray Anger, MD    MC/MEDQ  D:  01/10/2008  T:  01/10/2008  Job:  [id number]

## 2010-09-28 NOTE — Group Therapy Note (Signed)
NAME:  Alice [name], Alice [name] NO.:  [id number]   MEDICAL RECORD NO.:  [id number]          PATIENT TYPE:  WOC   LOCATION:  [facility]                   FACILITY:  [facility]   PHYSICIAN:  Nicoletta Dress, MD     DATE OF BIRTH:  1971-02-09   DATE OF SERVICE:                                  CLINIC NOTE   CHIEF COMPLAINT:  Complete physical exam with Pap smear.   HISTORY OF PRESENT ILLNESS:  This is a 40 year old morbidly obese  African American female who is status post gastric bypass surgery in  12/2005.  She then reports that she had a DVT with pulmonary embolism  following the surgery and is now on chronic Coumadin.  She has a history  of dysfunctional uterine bleeding and is on Depo-Provera injection for  this reason.  She has since lost approximately 100 pounds after her  surgery.  However, her vaginal bleeding has not changed.  She is  interested in an estrogen containing birth control pill but I discussed  with her the fact that she is still obese, her age is 40 years and she  has a history of a blood clot with pulmonary embolism.  An estrogen  containing birth control pill is contraindicated.  I discussed IUD  placement and she is interested in using an IUD for contraception.  Her  last Pap smear was 11/2005 and was normal.  She has no history of any  abnormal Pap smears.  She has been sexually active with one partner in  the past year in a monogamous relationship with her fiance.   PHYSICAL EXAMINATION:  GENERAL:  The patient is morbidly obese.  BREAST EXAM:  Reveals pendulous breasts that have no skin changes, no  nipple discharge and no gross masses on examination.  ABDOMEN:  Obese  with well-healed surgical scars.  PELVIC EXAMINATION:  Reveals normal external genitalia with some old  blood in the vaginal canal.  Vagina is pink and rugated.  Cervix is  nulliparous and is visualized anteriorly after some difficulty.  Pap  smear was obtained.  Bimanual examination  reveals a nontender uterus.  However, due to her body habitus this is not a satisfactory examination.  No obvious pelvic masses can be filled.   IMPRESSION:  1. Status post gastric bypass surgery one year ago.  2. History of DVT and pulmonary embolism.  3. Morbid obesity.  4. Chronic lower extremity ulcer for 4-5 years now.  5. Abnormal uterine bleeding most likely secondary to her obesity.   PLAN:  The patient Pap smear was obtained and the patient will be  notified of results.  She will also make an appointment for IUD  insertion given that she does not plan for any babies within the next  year.     ______________________________  Silas Sacramento, MD    ______________________________  Nicoletta Dress, MD    KL/MEDQ  D:  12/07/2006  T:  12/08/2006  Job:  [id number]

## 2010-09-28 NOTE — Op Note (Signed)
NAMEBASILIA, Alice [name]                  ACCOUNT NO.:  [id number]   MEDICAL RECORD NO.:  [id number]          PATIENT TYPE:  AMB   LOCATION:  SDS                          FACILITY:  [facility]   PHYSICIAN:  [name], MD     DATE OF BIRTH:  10-Oct-1970   DATE OF PROCEDURE:  01/01/2008  DATE OF DISCHARGE:                               OPERATIVE REPORT   PREOPERATIVE DIAGNOSIS:  Chronic venous stasis insufficiency ulcer,  right leg.   POSTOPERATIVE DIAGNOSIS:  Chronic venous stasis insufficiency ulcer,  right leg.   PROCEDURES:  1. Irrigation and debridement of chronic ulcer.  2. Application of 1 unit of Apligraf.   SURGEON:  [name], MD   ANESTHESIA:  None.   DISPOSITION:  To home in stable condition.   INDICATIONS FOR PROCEDURE:  The patient is a 40 year old woman with a  chronic venous stasis ulcer of the right leg.  She has undergone serial  compressive wraps and the ulcer has progressed to the point where it has  not improved.  Due to nonimprovement of venous stasis ulcer despite  conservative compressive wraps, the patient presents at this time for  application of 1 unit of Apligraf.  Risks and benefits were discussed.  The patient states he understands and wished to proceed at this time.   PROCEDURE:  The patient was brought to short-stay and the compressive  wrap to the right lower extremity was removed.  The wound was irrigated,  cleansed  and debrided.  The wound measured about 6 cm in diameter.  The  1 unit of Apligraf was meshed, fenestrated, and placed over the wound.  This was covered with Mepitel plus Bactroban cream plus 4x4s plus  Dynaflex compressive wrap.  The patient was then discharged to home,  plan to follow up in the office on Friday to replace the wrap.      [name], MD  Electronically Signed     MVD/MEDQ  D:  01/01/2008  T:  01/02/2008  Job:  [id number]

## 2010-09-28 NOTE — Group Therapy Note (Signed)
[name], [name] NO.:  [id number]   MEDICAL RECORD NO.:  [id number]          PATIENT TYPE:  WOC   LOCATION:  [facility]                   FACILITY:  [facility]   PHYSICIAN:  Darron Doom, MD        DATE OF BIRTH:  Jun 19, 1970   DATE OF SERVICE:  02/01/2007                                  CLINIC NOTE   REASON FOR VISIT:  Recheck of IUD string.   HISTORY:  This is a 40 year old nulliparas morbidly obese African-  American female who was seen here 2 months ago for her annual visit and  Pap smear, which was normal.  At that point, she was given an  appointment to get a Mirena IUD placed, which was done on January 04, 2007.  She comes today for string check.  In addition, she has current  medical problems of chronic hypertension and a chronic leg ulcer, for  which she was seen at [facility] today, and will be starting on  antibiotics, and continues on her same antihypertensive.  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  Since  the IUD was placed, she has had some intermittent lower abdominal  cramping and very light spotting.  She has had intercourse.  The strings  are not bothersome.  She has not tried to check for strings.  She is 4  weeks from LMP.   EXAM:  NEFG.  Vagina and cervix clean.  Strings are about 2.5 cm in  length.  There is scant amount of Schoeppner spotting on Q-tip manipulation.   ASSESSMENT:  Intrauterine device placement is optimal.   PLAN:  The patient is reassured and is advised to do periodic checks of  the string.  She is to return p.r.n. if she has abnormal bleeding,  abdominal pain, fever, or other symptoms.  Otherwise, she can return in  12 weeks for her annual visit.     ______________________________  Alice [name], CNM    ______________________________  Darron Doom, MD    DP/MEDQ  D:  02/01/2007  T:  02/01/2007  Job:   [id number]

## 2010-09-28 NOTE — Discharge Summary (Signed)
Alice [name], SEDENO NO.:  [id number]   MEDICAL RECORD NO.:  [id number]          PATIENT TYPE:  INP   LOCATION:  [id number]                         FACILITY:  [facility]   PHYSICIAN:  Luane School, M.D.   DATE OF BIRTH:  May 15, 1971   DATE OF ADMISSION:  11/05/2007  DATE OF DISCHARGE:  11/11/2007                               DISCHARGE SUMMARY   DISCHARGE DIAGNOSES:  1. Bilateral pulmonary embolism.  2. Hypertension.  3. Constipation.  4. Iron-deficiency anemia.  5. Obstructive sleep apnea.  6. Gastroesophageal reflux disease.  7. Chronic venous insufficiency with associated healing ulcer.  8. Chronic pain.   DISCHARGE MEDICATIONS:  1. Coumadin per pharmacy recommendations.  2. Colace 100 mg p.o. b.i.d.  3. MiraLax 17 g p.o. daily, the patient may mix with water or juice.  4. Albuterol MDI 1-2 puffs q.6 h. p.r.n. shortness of breath.  5. Norvasc 5 mg p.o. daily.  6. Ferrous sulfate 325 mg p.o. t.i.d.  7. Multivitamin p.o. daily.  8. Lyrica 100 mg p.o. t.i.d.  9. MS Contin 30 mg p.o. q.12 h.  10.MSIR 15 mg p.o. q.6 h. p.r.n. breakthrough pain.  11.Os-Cal plus D 500/200 one pill p.o. b.i.d.  12.Tylenol 650 mg p.o. q.8 h. p.r.n. pain.   DISPOSITION AND FOLLOWUP:  The patient is to return to her PCP, Dr.  Amil Amen.  The patient will follow up with [facility] in the Coumadin  Clinic to monitor her INR since the patient will be on Coumadin.  The  patient's chest pain and shortness of breath should be assessed at that  time as well.  The patient will also return to Dr. Sharol Given.  The patient  will call to make an appointment as well.   PROCEDURES PERFORMED:  1. The patient had a CT angio of the chest done on November 05, 2007.      Impression;      a.     Multiple bilateral pulmonary emboli.      b.     Mild bibasilar atelectasis.  2. The patient had a chest x-ray on November 07, 2007.  Impression;      a.     Right basilar and subsegmental atelectasis.  3. Chest x-ray November 08, 2007.  Impression;      a.     Stable bibasilar atelectasis or infiltrates.  4. The patient had a 2-D echo done on November 06, 2007.  Summary, overall      left ventricular systolic function was vigorous.  The left      ventricle ejection fraction was estimated range being 65-75%.      There was no diagnostic evidence of left ventricular regional wall      motion abnormalities.  The left ventricle wall thickness was      moderately increased.  The ventricular diastolic function      parameters were normal.  There was mild mitral valvular      regurgitation.  The left atrium was mildly dilated.  The right      ventricular size was at the upper limits  of normal.  The estimated      peak right ventricular systolic pressure was mildly increased.      There were no consultations made during the hospital stay.   CHIEF COMPLAINT:  Pain with deep inspiration and right upper extremity  pain.   PRIMARY CARE PHYSICIAN:  Marcelino Duster, MD with [facility].   HISTORY OF PRESENT ILLNESS:  The patient is a 36-yea-old woman with past  medical history significant for PE/DVT after gastric bypass in 2007,  hypertension, iron-deficiency anemia, OSA, GERD, and recent  hospitalization for treatment of right lower extremity wound stasis  ulcers, who presented to the ED secondary to shortness of breath, chest  pain, and right upper extremity pain.  The patient reports that on  Thursday 4 days prior to admission, she had her right upper extremity  PICC removed and has had right upper extremity pain since.  The patient  thought it is because that how she stepped on it.  She reports that at 6  a.m. on date of admission, she experienced sudden chest pain  (pleuritic), pain in her right back/flank under her breasts, and acute  shortness of breath.  The patient denies fevers, chills, vomiting,  abdominal pain, bilateral lower extremity pain, headache, blurred  vision, and dizziness.  The patient complains  of nausea but denies  diaphoresis and cough.   For allergies, past medical history, past surgical history, medications  and substance history, social history, and family history, please see  hospital chart.   REVIEW OF SYSTEMS:  As in HPI.   PHYSICAL EXAMINATION:  VITAL SIGNS:  Temperature equals 97.6, blood  pressure 148/104, pulse 79, respiratory rate 30, O2 sat 98% on room air.  GENERAL:  Mild distress secondary to pain, talks in full sentences but  tachypneic.  EYES:  PERLA, EOMI.  ENT:  MMM, no erythema.  NECK:  Supple, no JVD.  RESPIRATORY:  CTA bilaterally.  CV:  S1, S2, regular rate and rhythm with a heart rate in the 90s, no  murmurs, rubs, or gallops, no JVD.  GI:  Soft, NT, positive bowel sounds.  EXTREMITIES:  Right lower extremities wrapped secondary to chronic  venous insufficiency wound, no bilateral lower extremity tenderness,  positive right upper extremity pain without erythema or color.  SKIN:  No jaundice.  LYMPH:  No LAD.  MUSCULOSKELETAL:  Moves all 4s.  NEURO:  Nonfocal, alert and oriented x3.  PSYCH:  Appropriate.   ADMISSION LABORATORY DATA:  Sodium 141, potassium 3.8, chloride 106,  bicarb 28, BUN 11, creatinine 1.0, glucose 80, ionized calcium 1.19,  anion gap 7, WBC 7.7, hemoglobin 12.5, platelets 223, ANC 5.1, MCV 79.2,  and RDW 17.9.  Venous studies showed a positive clot in the right  brachial axillary vein and basilic vein.  CT angio was also done with  above-mentioned results.   HOSPITAL COURSE BY PROBLEM:  1. Bilateral pulmonary embolisms.  The patient was immediately put on      Lovenox with therapeutic dosing per pharmacy.  Coumadin was also      initiated and Lovenox was continued to bridge for the Coumadin      while it was becoming therapeutic.  The patient received more than      5 days of overlap between the 2.  The patient also had a 2-D echo      to evaluate for right ventricle strain secondary to a large clot      burden, results  of the 2-D echo  mentioned above.  The patient also      had a hypercoagulable panel secondary to her history of provoked      clotting.  Her hypercoag panel revealed that she was positive lupus      anticoagulant, as a result the patient will need lifetime      anticoagulation with Coumadin.  The patient continued to have chest      pain and shortness of breath throughout her hospital stay.      Initially, she was put on morphine for pain which did not seem to      help as a result, the patient was initiated on Dilaudid which      seemed to be helping the patient and she was not using the dose as      often as prescribed.  The Dilaudid was stopped and the patient was      placed on Percocet which did not seem to help at all and she was      subsequently put back on Dilaudid and continued to have p.r.n.      Percocets.  The patient was then transitioned from Dilaudid to [place] and MSIR with gradual up titration of her dosing to help      control her pain.  She was discharged on the MS Contin and MSIR      with the dose as mentioned above for pain control.  Of note, the      patient does have chronic pain which this medication should help      control.  The patient was not requiring the MSIR every 6 hours as      ordered.  The patient will need a followup in the Coumadin Clinic      over at [facility] to monitor her INR.  2. Hypertension.  The patient had good control during the hospital      stay at time of discharge and will be continued on her Norvasc in      the outpatient setting.  3. Constipation.  The patient was put on Colace and MiraLax was added      in addition to the patient receiving a sorbitol enema with benefit.  4. Iron-deficiency anemia.  The patient was continued on her home dose      of ferrous sulfate 325 mg p.o. t.i.d.  5. Obstructive sleep apnea.  The patient continued on her CPAP.  6. Gastroesophageal reflux disease.  The patient was put on Protonix       while in hospital.  7. Chronic venous insufficiency.  The patient will follow up with Dr.      Sharol Given in the outpatient setting to follow up her chronic venous      insufficiency ulcers that were positive with Pseudomonas.  The      patient is to call Dr. Jess Barters office to arrange for this      appointment.  8. Chronic pain.  Please see assessment #1 for discharge medications.   DISCHARGE LABORATORY DATA:  PT 24.1 and INR of 2.1.   DISCHARGE VITAL SIGNS:  Temperature 98.0, pulse 92, respiratory rate 16,  blood pressure 135/89, and O2 sats 96% on room air.      Katha Cabal, M.D.  Electronically Signed      Luane School, M.D.  Electronically Signed    JY/MEDQ  D:  12/21/2007  T:  12/21/2007  Job:  [id number]   cc:   [name],  MD

## 2010-10-01 NOTE — Discharge Summary (Signed)
NAMEKENNEDY, LEBECK NO.:  [id number]   MEDICAL RECORD NO.:  [id number]          PATIENT TYPE:  INP   LOCATION:  5709                         FACILITY:  [facility]   PHYSICIAN:  Monia Sabal. Jobe Igo, M.D.  DATE OF BIRTH:  03-04-1971   DATE OF ADMISSION:  02/25/2004  DATE OF DISCHARGE:  03/11/2004                                 DISCHARGE SUMMARY   DISCHARGE DIAGNOSES:  1.  Morbid obesity.  2.  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  3.  Hypertension.  4.  Chronic constipation.  5.  Lower extremity stasis ulcer with secondary cellulitis.   DISCHARGE MEDICATIONS:  1.  Hydrochlorothiazide 25 mg one p.o. daily.  2.  Diltiazem 180 mg CD one p.o. daily.  3.  Colace 1-2 tablets up to 2-3 tablets p.o. b.i.d.  4.  Tylenol #3 1-2 tablets p.o. q.6h. p.r.n. for pain.   FOLLOW UP:  The patient is to follow up at the [facility] Friday,  October 28, for placement of Una boot and follow up at [facility] with Dr. Jobe Igo on March 15, 2004.   DIET:  Low fat, 1800 calorie diet at time of discharge.   HOSPITAL COURSE:  Ms. Florence is a 40 year old woman with a prolonged history  of post phlebitic syndrome and right lower extremity ulcer which has had  poor healing, despite aggressive outpatient care at the [facility].  Dr.  Sharol Given, who was following the patient, recommended inpatient wound care and  elevation and accordingly, we admitted Ms. Owens Shark for evaluation and  management.  No surgical debridement planned at this stage in her care.   PAST MEDICAL HISTORY:  1.  DVT without pulmonary embolus of the right lower extremity.  2.  Obesity.  3.  Hypertension.  4.  No history of glucose abnormalities in the  past.  5.  She is a G0, P0, sexually active, with a boyfriend using condoms.  6.  On SSI secondary to chronic right lower extremity wound management      issues.   MEDICATIONS ON ADMISSION:  1.  Hydrochlorothiazide 25 mg one p.o. daily.  2.  Diltiazem 120 mg XR daily.   PHYSICAL EXAMINATION:  GENERAL:  Obese, poorly conditioned woman in no acute  distress.  HEENT:  Oropharynx clear.  NECK:  Without lymphadenopathy.  SKIN:  Changes consistent with acanthosis nigricans on the neck and axilla.  LUNGS:  Clear to auscultation.  CARDIAC:  Regular rate and rhythm without murmurs, rubs, or gallops.  EXTREMITIES:  She has a very large, chronic-appearing wound with exudate on  the right lower extremity with dimensions as outlined in the wound care  notes.   IMPRESSION:  At time of admission, right lower extremity wound, chronic,  with plans at time of admission, local care with elevation, enzymatic  debridement, and conservative ongoing management of her hypertension and  obesity.   HOSPITAL COURSE:  1.  LOWER EXTREMITY ULCERATION:  The patient was managed with local wound  care with both debridement by enzymatic means using __________dressings      for absorption of exudate.  In addition Silvadene cream was applied with      several day treatment with enzymatic debridement using Acuson as a      topical agent.  In addition she was given conservative management with      leg elevation additionally using DVT prophylaxis in light of her morbid      obesity.  On October 24, the patient was seen by plastics on      consultative basis.  They recommended discontinuation of Acuson, leg      elevation, and repeat of ABIs which were within normal limits.  Una's      boots were placed with the plan to possibly attempt Apligraf if she      could achieve secondary healing while at home.  On October 27 the      patient was felt to be sufficiently improved to be discharged home.   She was seen in consultation by care management, who recommended  a  supportive frame for leg elevation at home.  This was arranged.   She needed quite a bit of pain management while in hospital with  predressing  change morphine, and accordingly, she was provided a prescription for  Tylenol #3 sufficient for two tablets q.6-8h. and for breakthrough pain with  a total of 200 tablets per month.   1.  HYPERTENSION:  Well controlled while in hospital, although medication      was adjusted upward to 180 mg CD daily.  Hydrochlorothiazide was      continued.   1.  MORBID OBESITY:  The patient was given a regular diet, but she has had,      as she has in the past, a rather ad hoc approach to her caloric intake      and she was counseled on this by both myself and the Education officer, museum.  No      major issues with respect to depression on this hospital stay, but she      does not appear to be highly motivated with respect to weight loss.      Although she makes some positive comments with regard to the hope to      lose weight, is not doing much to in fact achieve that.  It is      understood that her immobility was a major factor in her ongoing      difficulties with her weight, although I suspect this will be an ongoing      issue for her.   FOLLOW UP:  As noted above.   CONDITION ON DISCHARGE:  Stable.       DCT/MEDQ  D:  04/27/2004  T:  04/27/2004  Job:  [id number]   cc:   Monia Sabal. Jobe Igo, M.D.  [address] [place]  [place] [address]  Fax: [phone]

## 2010-10-01 NOTE — H&P (Signed)
NAME:  Alice [name], MAYHER NO.:  [id number]   MEDICAL RECORD NO.:  [id number]          PATIENT TYPE:  EMS   LOCATION:  [facility]                         FACILITY:  [facility]   PHYSICIAN:  Billey Chang, M.D.     DATE OF BIRTH:  09-Jan-1971   DATE OF ADMISSION:  01/08/2006  DATE OF DISCHARGE:                                HISTORY & PHYSICAL   ATTENDING PHYSICIAN:  Billey Chang, M.D.   PRIMARY CARE PHYSICIAN:  Monia Sabal. Jobe Igo, M.D.   CHIEF COMPLAINT:  Left knee pain and shortness of breath.   HISTORY OF PRESENT ILLNESS:  This patient is a 40 year old morbidly obese  African-American female who had gastric bypass surgery two weeks ago.  She  was discharged from [facility].  Thursday, following discharge, she  developed crampy pain behind her left knee and swelling in the left leg.  Saturday night, she developed acute dyspnea, which is better now.  She  denies any chest pain or pleuritic pain at present.  She is in no  respiratory distress and is breathing 20 times a minute, comfortably satting  normally on room air.   CT in the emergency room shows a saddle embolus.  Dopplertech shows there is  no residual clot in the leg.   PAST MEDICAL HISTORY:  1. 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  2. Hypertension.  3. Obesity.  4. Neuropathic pain in the legs.   MEDICATIONS:  1. Diltiazem 180 mg p.o. daily.  2. Lyrica 75 mg p.o. daily.  3. Tylenol #3 p.r.n.   ALLERGIES:  No known drug allergies.   PAST SURGICAL HISTORY:  1. She has a history of a right lower extremity skin graft in September,      2006.  It is on her anterior thigh.  2. She has a history of gastric bypass in August, 2007.  I am getting the      records over the type of the procedure, but it looks like it was a      laparoscopic banding procedure.   SOCIAL HISTORY:  Patient lives in [place] with her boyfriend.  No  children.  No tobacco, alcohol, or  drugs.   FAMILY HISTORY:  Mother has chronic renal failure, status post renal  transplant that she received at age 67 secondary to hypertension.  Her  father is alive and well.  She has two brothers, one of which is on dialysis  secondary to a hypertensive neuropathy.  She has one sister who is alive and  well.   PRIMARY CARE PHYSICIAN:  Monia Sabal. Jobe Igo, M.D. at [facility].   PHYSICAL EXAMINATION:  VITAL SIGNS:  Temperature is 99.5, pulse 109,  respiratory rate 20, sat 98% on room air, blood pressure 148/6l.  GENERAL:  Patient is moaning in pain that is located in her left leg.  She  is alert and oriented x3.  NEUROLOGIC:  Cranial nerves II-XII are grossly intact.  Muscle strength is  5/5, equal and symmetric in the upper and lower extremities.  Patient is  able to move all four extremities without deficit.  She has normal reflexes.  HEENT:  Atraumatic and normocephalic.  Pupils are equal, round and reactive  to light.  Extraocular movements are intact.  TM's and canals are clear.  There is no erythema or exudate in the posterior oropharynx.  NECK:  No lymphadenopathy.  No thyromegaly.  No JVD.  CARDIOVASCULAR:  Regular rate and rhythm.  No murmurs, rubs or gallops.  PULMONARY:  Clear to auscultation bilaterally.  No wheezes, crackles, or  rales.  ABDOMEN:  Soft, nontender, nondistended.  Positive bowel sounds.  Patient  has three 1 cm laparoscopic scars that are well healed.  EXTREMITIES:  Right lower extremity has an Unna boot in place from the  tibial tuberosity to the ankle.  Left lower extremity is exquisitely tender  to palpation behind the left knee.  She has a positive Homans' sign with +1  pitting edema.  There is an old skin graft on her right thigh.   LABS:  BNP is less than 30.  White count is 12.8, hemoglobin 9.9, hematocrit  30.9, platelet count 339.  PT is 15.7, INR 1.2, PTT 26.  D-dimer is greater  than 20.  Total protein is 8.2, albumin 2.9, AST 29, ALT 48, alk  phos 97.  T  bili 0.8, direct bili 0.1, indirect bili 0.7.  Sodium 138, potassium 3.9,  chloride 105, bicarb 26, BUN 7, creatinine 1.7, glucose 103.  Point-of-care  enzymes are negative x1.   CT angiogram in the emergency department shows a saddle embolism with  extensive thrombus at the left pulmonary artery.   ASSESSMENT/PLAN:  A 40 year old African-American female with saddle  pulmonary embolus.  1. Pulmonary embolus:  Lower extremity Dopplers per the technologist shows      no residual clot in the leg; therefore, there is no indication for      [place] filter.  She does have a saddle pulmonary embolus.  We will      admit to step-down unit.  There is no hemodynamic compromise at      present.  We will get operative reports and if lap band, we will start      the patient on heparin, as she is a low bleed risk.  If there was a      major abdominal surgery performed less than two weeks ago, we will      consult pulmonary regarding the path of directed t-PA.  2. Hypertension:  Stable.  Continue Diltiazem.  3. Fluids, electrolytes, nutrition:  Place the patient on a low fat, low      protein diet to prevent refeeding syndrome.  Again, IV fluids at [facility].  4. Hematology:  Will type and cross 2 units and follow with q.6h.      hemoglobin and hematocrit, given her recent surgery.  5. Prophylaxis:  We will start her on Protonix and treatment dose heparin,      will prevent any further deep venous thrombosis, and sliding-scale      insulin, given the fact that she is critically ill.      Cletus Gash T. Pedro Earls, MD    ______________________________  Billey Chang, M.D.    WTP/MEDQ  D:  01/08/2006  T:  01/08/2006  Job:  [id number]

## 2010-10-01 NOTE — Discharge Summary (Signed)
Alice [name], Alice [name] NO.:  [id number]   MEDICAL RECORD NO.:  [id number]          PATIENT TYPE:  INP   LOCATION:  2039                         FACILITY:  [facility]   PHYSICIAN:  Carolyn Stare, MDDATE OF BIRTH:  Jul 30, 1970   DATE OF ADMISSION:  01/08/2006  DATE OF DISCHARGE:  01/14/2006                                 DISCHARGE SUMMARY   DISCHARGE DIAGNOSES:  1. Pulmonary embolus.  2. Hypertension.  3. Left lower extremity venous __________ ulcer.  4. Status post gastric bypass for obesity treatment.   MEDICATIONS AT DISCHARGE:  1. Diltiazem 180 mg p.o. daily.  2. Lyrica 75 mg p.o. daily.  3. Coumadin 7.5 mg p.o. daily.  4. Lovenox __________  mg x1 today at 7:00 p.m.   BRIEF HISTORY:  The patient is status post thoracic surgery who developed a  pulmonary embolus which was admitted to the hospital and received  anticoagulation therapy with heparin followed by Lovenox and Coumadin.  The  patient's INR checked daily goal to 3, today her INR is 2.2 so she will be  ready for discharge but she has one more dose of Lovenox to complete 5 day  treatment which she will provided by hospital.  The patient to continue on  Coumadin 7.5 mg p.o. daily, INR will be checked by home health aide and  reported to Dr. Jobe Igo at [facility] who is going to manage her chronic  anticoagulation in the next 3 months.   The patient stable hemodynamically with a blood pressure in the normal range  ambulating already with minimal lower extremity tenderness, well controlled  with pain medication consisting of Percocet 5/325 mg p.o. q.6 h. p.r.n.  Also treated with Lyrica 25 mg p.o. b.i.d.   For more extensive details, please review discharge summary dictated by Dr.  __________ .      Carolyn Stare, MD     IM/MEDQ  D:  01/14/2006  T:  01/14/2006  Job:  [id number]

## 2010-10-01 NOTE — Discharge Summary (Signed)
Alice [name], Alice [name] NO.:  [id number]   MEDICAL RECORD NO.:  [id number]          PATIENT TYPE:  INP   LOCATION:                               FACILITY:  [facility]   PHYSICIAN:  Monia Sabal. Jobe Igo, M.D.  DATE OF BIRTH:  12-10-1970   DATE OF ADMISSION:  02/25/2004  DATE OF DISCHARGE:  03/11/2004                                 DISCHARGE SUMMARY   The patient was admitted to [facility] on February 25, 2004 for  right lower extremity wound care.   ADMISSION DIAGNOSIS:  Right lower extremity wound from stasis dermatitis.   DISCHARGE DIAGNOSES:  1.  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  2.  Hypertension.  3.  Morbid obesity with associated deconditioning.   PROCEDURE:  None.   DISCHARGE MEDICATIONS:  1.  HCTZ 25 mg one p.o. daily.  2.  Diltiazem 180 mg CD one p.o. daily.  3.  Colace 2-3 tablets b.i.d. p.r.n.  4.  Tylenol No. 3 1-2 tabs q.6 h. p.r.n. for pain.   FOLLOW UP:  The patient was advised to follow up at the [facility] on  March 12, 2004 for placement of [employer] and at [facility] on  March 15, 2004 with Dr. Jobe Igo.   HISTORY:  The patient was admitted on February 25, 2004 for right lower  extremity wound care.  In brief, she is a 40 year old woman with long  history of postphlebitic syndrome and right lower extremity ulcer.  She has  had a number of procedures related to this including prolonged followup care  at the [facility] with previous applications of grafting with use of  Apligraf material.  She has apparently been seen by Dr. Sharol Given for the  majority of her history while followed at [facility].   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  She is G0, para 0, sexually active with boyfriend using condoms and  she is on disability secondary to her chronic lower extremity  wound.   MEDICATIONS AT TIME OF ADMISSION:  1.  HCTZ 25 mg daily.  2.  Diltiazem 120 XR daily.   PHYSICAL EXAMINATION:  VITAL SIGNS:  On initial exam, temperature 98.6,  heart rate 90, respirations 20, blood pressure 180/90, height 68 inches with  a weight of 421 pounds.  GENERAL:  Obese, poorly conditioned in no acute distress.  HEENT:  Oropharynx clear.  NECK:  Without lymphadenopathy or thyromegaly.  SKIN:  Changes consistent with acanthosis nigricans.  LUNGS:  Clear to auscultation.  CARDIAC:  Regular rate and rhythm without murmurs, rubs or gallops.  EXTREMITIES:  Right lower extremity:  Very large wound.  Please see diagram  on February 26, 2004 at outlined with demarcation at edges.  No undermining  of wound edges and associated moderate purulence of greenish mucoid exudate.   IMPRESSION:  1.  Right lower extremity wound, chronic, with plan for local care,      elevation, and  enzymatic debridement while in hospital.  2.  Hypertension.  Continue HCTZ and Diltiazem with adjusted doses as needed      to control blood pressure.  3.  Obesity.  DVT prophylaxis with Lovenox 40 mg daily.   Of note, the patient has had no history of bleeding and she has been guaiac  negative on outpatient visits.   HOSPITAL COURSE:  The patient was admitted to medical surgical floor with  placement of dressings and ongoing wound care.   She was seen Dr. Sharol Given and recommended strict leg elevation, Profor  dressings, and initial enzymatic debridement.  In addition, she was seen by  skin care who recommended cessation of enzymatic debridement with placement  of Profor dressings.   Duplex Dopplers were obtained which were within normal limits.   She was seen by Dr. Harlow Mares and associated with wound care team.  Some  question about need for Apligraf was addressed with their recommendations to  continue placement of either Una boot or Profor with followup on an  outpatient basis to ensure that initial  healing had taken place.   Subsequent to discontinuation of the Accuzyme, and ongoing leg elevation,  the patient was felt to be reasonably improved on March 11, 2004 and  stable for discharge on the medications outlined above.  We will continue to  follow on a long term basis via [place] with skin care nurse at  that time.  An arrangement was made for Buck's traction to be delivered by  [place] at home.   The patient may need further assessment for skin care with Dr. Alanda Amass  at [facility] in light of the refractory nature of this wound.   Followup as outlined above.       ___________________________________________  Monia Sabal. Jobe Igo, M.D.    DCT/MEDQ  D:  08/12/2004  T:  08/12/2004  Job:  [id number]
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
## 2010-10-01 NOTE — H&P (Signed)
NAMERYLENN, VULGAMORE NO.:  [id number]   MEDICAL RECORD NO.:  [id number]          PATIENT TYPE:  EMS   LOCATION:  [facility]JO                         FACILITY:  [facility]   PHYSICIAN:  Billey Chang, M.D.     DATE OF BIRTH:  1971/03/22   DATE OF ADMISSION:  01/08/2006  DATE OF DISCHARGE:                                HISTORY & PHYSICAL   ADDENDUM:  Received operative report from [facility].  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 Hinton Dyer Portenier.  I called [facility] and spoke  with a Dr. Gay Filler, who was the resident on call for general surgery  tonight at [facility].  They said that actually there is a low bleed risk for this  procedure.  These patient's actually receive Lovenox postop for DVT  prophylaxis.  They think that the risk of bleeding is very small.  Therefore, I will go ahead and start the patient on heparin; however, I will  not bolus with heparin, I will just start the drip.  In addition, I will  monitor q.4h. hemoglobins and hematocrits.  We will go ahead and type and  cross 2 units and have that on standby.  If the patient's hemoglobin drops  acutely, will transfuse 2 units and reverse the heparin drip with Protamine.  Discussed this with my attending, Dr. Jonni Sanger, and will proceed with the plan  as dictated above.      Cletus Gash T. Pedro Earls, MD    ______________________________  Billey Chang, M.D.    WTP/MEDQ  D:  01/08/2006  T:  01/08/2006  Job:  [id number]

## 2010-10-01 NOTE — Group Therapy Note (Signed)
NAME:  Alice [name], Alice [name] NO.:  [id number]   MEDICAL RECORD NO.:  [id number]          PATIENT TYPE:  WOC   LOCATION:  [facility]                   FACILITY:  [facility]   PHYSICIAN:  Nicoletta Dress, MD     DATE OF BIRTH:  June 16, 1970   DATE OF SERVICE:  11/24/2005                                    CLINIC NOTE   This 40 year old black female, nulligravid, presents with severe obesity and  abnormal uterine bleeding.  She is followed down in [place].  She has  been having abnormal uterine bleeding for a number of years.  It should be  noted that she weighs close to 400 pounds.  She has recently lost 20-30  pounds seeing a nutritionist as she prepares to have gastric bypass surgery  performed next month at [facility].  She received Depo-Provera 3 months  ago, which has helped the bleeding, and it is lighter.  Of significance in  her past history is the fact that she has had pulmonary embolus, deep venous  thrombosis about 8 years ago.  Because of that, an estrogen-containing birth  control pill or patch would be contraindicated.  She has had a nonhealing  lesion on her right leg and has been followed for that.  Her last Pap smear  was about 2 years ago.   PHYSICAL EXAMINATION:  The patient is grossly obese and by her history now  weighs in at about 380-something pounds.  Her abdomen is obese.  No masses  are palpable.  Pelvic examination, external genitalia and BUS glands are  normal.  Vagina is epithelialized.  The cervix is nulliparous and is seen  with some difficulty.  Pap smear is obtained.  Bimanual examination is not  really satisfactory.  No large pelvic masses can be felt.   IMPRESSION:  1.  Abnormal uterine bleeding, most likely secondary to her obesity.  2.  Morbid obesity.  3.  Chronic lower extremity ulcer.   DISPOSITION:  1.  A Pap smear was obtained.  2.  The patient will go to [facility] tomorrow for her 70-month Depo-      Provera.  She will  continue to use that as she states that it has      definitely slowed down her bleeding, and I explained that over time, it      should really slow her bleeding down and may even stop it.  I have      encouraged her to follow up with the nutrition center and the [facility]      people after she has her surgery so that she can maximize the results      that she gets.           ______________________________  Nicoletta Dress, MD     ER/MEDQ  D:  11/24/2005  T:  11/24/2005  Job:  [id number]
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
## 2010-10-01 NOTE — Op Note (Signed)
   NAME:  Alice [name], Alice [name]                            ACCOUNT NO.:  [id number]   MEDICAL RECORD NO.:  [id number]                   PATIENT TYPE:  AMB   LOCATION:  SDC                                  FACILITY:  [facility]   PHYSICIAN:  Phil D. Kalman Shan, M.D.                  DATE OF BIRTH:  [birth year]   DATE OF PROCEDURE:  12/24/2001  DATE OF DISCHARGE:                                 OPERATIVE REPORT   PREOPERATIVE DIAGNOSIS:  Menometrorrhagia.   POSTOPERATIVE DIAGNOSIS:  Menometrorrhagia, pending pathology report.   PROCEDURE:  Dilatation, curettage, hysteroscopy examination.   SURGEON:  Franchot Heidelberg. Rose, M.D.   DESCRIPTION OF PROCEDURE:  Under satisfactory general anesthesia, with the  patient in the dorsal lithotomy position, the perineum and vagina were  prepped and draped in the usual sterile manner.  A bimanual pelvic  examination under anesthesia revealed an anterior uterus but could not be  well __________ because of the habitus of the patient at more than 300  pounds.  Adnexa could not be palpated.  Cul-de-sac seemed free.  A weighted  speculum was in placed in the posterior fourchette of the vagina through a  marital introitus.  The anterior lip of the cervix which was a nulliparous  cervix was grasped with a single-tooth tenaculum.  The uterine cavity was  sounded to 10 cm, cervical os dilated to a #6 Hager dilator and a 30 degree  hysteroscope with normal saline was dilated.  It was inserted into the  uterine cavity.  The entire cavity easily visualized and found to be  completely normal.  The scope was removed, uterine cavity curetted with  small serrated curets and sent for pathology for diagnosis.  Tenaculum and  speculum both in the vagina.  Note:  In this patient with obvious  dysfunctional uterine bleeding, history of deep vein thrombosis with being  nulliparous and weighing more than 300 pounds to control the bleeding  perhaps Marina intrauterine device might be the best choice  since the  patient does not want to give up her chances to eventually have children.  Oral contraceptives are certainly counter indicated and endometrial ablation  also with because of the certainty of infertility in that situation.  The  patient was transferred to the recovery room in satisfactory condition with  minimal blood loss.                                               Phil D. Kalman Shan, M.D.    PDR/MEDQ  D:  12/24/2001  T:  12/25/2001  Job:  [id number]

## 2010-10-01 NOTE — Consult Note (Signed)
NAMEJERZY, SPITZLEY NO.:  [id number]   MEDICAL RECORD NO.:  [id number]          PATIENT TYPE:  INP   LOCATION:  5709                         FACILITY:  [facility]   PHYSICIAN:  Crissie Reese, M.D.     DATE OF BIRTH:  1970-12-28   DATE OF CONSULTATION:  03/08/2004  DATE OF DISCHARGE:                                   CONSULTATION   REFERRING PHYSICIAN:  Monia Sabal. Jobe Igo, M.D.   CHIEF COMPLAINT:  Open wound, right leg.   HISTORY OF PRESENT ILLNESS:  This is a 40 year old woman with a long history  of venous stasis ulcer of right leg.  This has been healed once in the past.  She then came out of her compression stockings unfortunately and the ulcer  reoccurred over 1 year ago.  It has been present now since that time.  Many  different modalities of therapy have been tried including some compression  and Accuzyme.  The wounds have continued to be painful.   PAST MEDICAL HISTORY:  Negative.   SOCIAL HISTORY:  She is a nonsmoker.   PHYSICAL EXAMINATION:  INTEGUMENTARY:  There is a wound that is very tender,  right leg, with somewhat irregular borders.  There is no exudate.  The wound  appears to be clean.  The wound does wrap around posteriorly.  VASCULAR:  Dorsalis pedis and posterior tibial pulses are difficult to  palpate today possibly due to edema.   RECOMMENDATIONS:  1.  Discontinue the Accuzyme.  2.  Leg elevation when possible.  3.  I would go ahead with ABIs since I cannot find any in the chart.  4.  The patient does seem to be agreeable with the treatment plan, although      there is documentation in the chart that she has not been compliant.       DB/MEDQ  D:  03/08/2004  T:  03/08/2004  Job:  [id number]   cc:   [name], M.D.  Fax: [phone]

## 2011-02-02 ENCOUNTER — Other Ambulatory Visit (HOSPITAL_COMMUNITY)
Admission: RE | Admit: 2011-02-02 | Discharge: 2011-02-02 | Disposition: A | Discharge status: Home / Self Care | Payer: Medicare Other | Source: Ambulatory Visit | Attending: Obstetrics and Gynecology | Admitting: Obstetrics and Gynecology

## 2011-02-02 ENCOUNTER — Ambulatory Visit (INDEPENDENT_AMBULATORY_CARE_PROVIDER_SITE_OTHER): Payer: Medicaid Other | Admitting: Obstetrics and Gynecology

## 2011-02-02 VITALS — BP 142/94 | HR 94 | Temp 98.8°F | Ht 68.0 in | Wt 327.4 lb

## 2011-02-02 DIAGNOSIS — Z124 Encounter for screening for malignant neoplasm of cervix: Secondary | ICD-10-CM

## 2011-02-02 DIAGNOSIS — Z01419 Encounter for gynecological examination (general) (routine) without abnormal findings: Secondary | ICD-10-CM | POA: Insufficient documentation

## 2011-02-02 NOTE — Progress Notes (Signed)
This patient is a 40 year old African American female nulligravida morbidly obese. The past 3 years she's been on warfarin because of pulmonary emboli. 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 She has a Lenda Kelp IUD which is due to be removed next August. I've told her to call a couple months and advanced to make an appointment at to be removed in one put it in the same day sent she states that she loves it. She is in today for a Pap smear they've always been okay she's had one every year. I've told her we would go ahead and do 1 today and she would need a repeat on for 3 years.  Examination: Genital: External normal introitus marital. BUS within normal limits. Vagina clean and well rugated. Cervix clean nulliparous and well epithelialized. Uterus and adnexa could not be palpated because of the habitus of the patient.  Impression: Normal gynecological exam pending pathology report.

## 2011-02-09 LAB — CBC
HCT: 39.5
Hemoglobin: 12.1
Hemoglobin: 12.9
MCHC: 32.8
MCHC: 34.1
MCV: 77.4 — ABNORMAL LOW
Platelets: 318
RBC: 4.56
RBC: 4.59
RDW: 15.8 — ABNORMAL HIGH
WBC: 6.5

## 2011-02-09 LAB — URINALYSIS, ROUTINE W REFLEX MICROSCOPIC
Bilirubin Urine: NEGATIVE
Glucose, UA: NEGATIVE
Hgb urine dipstick: NEGATIVE
Ketones, ur: NEGATIVE
Nitrite: NEGATIVE
Protein, ur: NEGATIVE
Specific Gravity, Urine: 1.013
Urobilinogen, UA: 0.2
pH: 6.5

## 2011-02-09 LAB — RETICULOCYTES
RBC.: 5.13 — ABNORMAL HIGH
Retic Count, Absolute: 35.9
Retic Ct Pct: 0.7

## 2011-02-09 LAB — CULTURE, BLOOD (ROUTINE X 2)

## 2011-02-09 LAB — BASIC METABOLIC PANEL
CO2: 28
Calcium: 9.1
Chloride: 108
Creatinine, Ser: 0.95
GFR calc Af Amer: >60
GFR calc Af Amer: >60
Potassium: 3.8
Sodium: 140

## 2011-02-09 LAB — RAPID URINE DRUG SCREEN, HOSP PERFORMED
Benzodiazepines: NOT DETECTED
Cocaine: NOT DETECTED
Opiates: POSITIVE — AB

## 2011-02-09 LAB — POCT I-STAT, CHEM 8
BUN: 14
Calcium, Ion: 1.19
Hemoglobin: 13.9
TCO2: 27

## 2011-02-09 LAB — WOUND CULTURE

## 2011-02-09 LAB — COMPREHENSIVE METABOLIC PANEL
Albumin: 3.1 — ABNORMAL LOW
BUN: 11
Calcium: 9
Creatinine, Ser: 1
GFR calc Af Amer: >60
Total Bilirubin: 0.8
Total Protein: 7.3

## 2011-02-09 LAB — DIFFERENTIAL
Basophils Absolute: 0
Basophils Relative: 0
Eosinophils Absolute: 0.1
Eosinophils Relative: 1
Monocytes Absolute: 0.6

## 2011-02-09 LAB — IRON AND TIBC: UIBC: 298

## 2011-02-09 LAB — FERRITIN: Ferritin: 14 (ref 10–291)

## 2011-02-09 LAB — VITAMIN B12: Vitamin B-12: 345 (ref 211–911)

## 2011-02-09 LAB — PREGNANCY, URINE: Preg Test, Ur: NEGATIVE

## 2011-02-10 LAB — BASIC METABOLIC PANEL
BUN: 7
Chloride: 106
Creatinine, Ser: 0.84
Glucose, Bld: 106 — ABNORMAL HIGH
Potassium: 3.4 — ABNORMAL LOW

## 2011-02-10 LAB — BASIC METABOLIC PANEL WITH GFR
BUN: 7
BUN: 7
CO2: 28
CO2: 30
Calcium: 9.3
Calcium: 9.5
Chloride: 104
Chloride: 104
Creatinine, Ser: 0.86
Creatinine, Ser: 1
GFR calc non Af Amer: >60
GFR calc non Af Amer: >60
Glucose, Bld: 127 — ABNORMAL HIGH
Glucose, Bld: 96
Potassium: 3.9
Potassium: 4
Sodium: 140
Sodium: 141

## 2011-02-10 LAB — CARDIAC PANEL(CRET KIN+CKTOT+MB+TROPI)
CK, MB: 0.5
CK, MB: 0.7
CK, MB: 0.7
Relative Index: INVALID
Relative Index: INVALID
Relative Index: INVALID
Total CK: 69
Total CK: 75
Total CK: 76

## 2011-02-10 LAB — PROTIME-INR
INR: 1.1
INR: 1.1
INR: 1.5
INR: 2.1 — ABNORMAL HIGH
INR: 2.2 — ABNORMAL HIGH
INR: 2.3 — ABNORMAL HIGH
INR: 2.6 — ABNORMAL HIGH
Prothrombin Time: 14.2
Prothrombin Time: 14.5
Prothrombin Time: 18.8 — ABNORMAL HIGH
Prothrombin Time: 24.1 — ABNORMAL HIGH
Prothrombin Time: 25.2 — ABNORMAL HIGH
Prothrombin Time: 26 — ABNORMAL HIGH
Prothrombin Time: 28.4 — ABNORMAL HIGH

## 2011-02-10 LAB — BETA-2-GLYCOPROTEIN I ABS, IGG/M/A
Beta-2 Glyco I IgG: 4 U/mL
Beta-2-Glycoprotein I IgA: <4 U/mL
Beta-2-Glycoprotein I IgM: <4 U/mL

## 2011-02-10 LAB — CBC
HCT: 33.9 — ABNORMAL LOW
HCT: 36
HCT: 36.3
Hemoglobin: 11.6 — ABNORMAL LOW
Hemoglobin: 12.1
Hemoglobin: 12.1
MCHC: 33.2
MCHC: 33.4
MCHC: 33.6
MCHC: 34.3
MCV: 78.3
MCV: 78.8
MCV: 78.9
MCV: 79.2
MCV: 79.8
Platelets: 212
Platelets: 213
Platelets: 223
Platelets: 234
Platelets: 252
Platelets: 275
RBC: 4.3
RBC: 4.59
RBC: 4.61
RBC: 4.76
RDW: 17.6 — ABNORMAL HIGH
RDW: 17.7 — ABNORMAL HIGH
RDW: 17.8 — ABNORMAL HIGH
WBC: 6.7
WBC: 8.4
WBC: 8.6
WBC: 9.2
WBC: 9.2

## 2011-02-10 LAB — COMPREHENSIVE METABOLIC PANEL
ALT: 27
Alkaline Phosphatase: 163 — ABNORMAL HIGH
BUN: 8
CO2: 26
Chloride: 107
Glucose, Bld: 121 — ABNORMAL HIGH
Potassium: 3.6
Total Bilirubin: 0.8

## 2011-02-10 LAB — LUPUS ANTICOAGULANT PANEL
DRVVT: 51.4 — ABNORMAL HIGH (ref 36.1–47.0)
PTT Lupus Anticoagulant: 53.6 — ABNORMAL HIGH (ref 36.3–48.8)
PTTLA 4:1 Mix: 51.1 — ABNORMAL HIGH (ref 36.3–48.8)

## 2011-02-10 LAB — POCT I-STAT 3, ART BLOOD GAS (G3+)
Acid-Base Excess: 2
O2 Saturation: 92
Patient temperature: 37
pCO2 arterial: 46.5 — ABNORMAL HIGH

## 2011-02-10 LAB — APTT: aPTT: 26

## 2011-02-10 LAB — DIFFERENTIAL
Basophils Relative: 0
Eosinophils Absolute: 0.1
Monocytes Relative: 10
Neutrophils Relative %: 66

## 2011-02-10 LAB — PROTEIN S, TOTAL: Protein S Ag, Total: 113 % (ref 70–140)

## 2011-02-10 LAB — CARDIOLIPIN ANTIBODIES, IGG, IGM, IGA
Anticardiolipin IgA: <7 — ABNORMAL LOW
Anticardiolipin IgG: <7 — ABNORMAL LOW
Anticardiolipin IgM: <7 — ABNORMAL LOW

## 2011-02-10 LAB — POCT I-STAT, CHEM 8
Calcium, Ion: 1.19
Glucose, Bld: 80
HCT: 38
Hemoglobin: 12.9
Potassium: 3.8
TCO2: 28

## 2011-02-10 LAB — PREGNANCY, URINE: Preg Test, Ur: NEGATIVE

## 2011-02-10 LAB — PROTEIN C ACTIVITY: Protein C Activity: 126 % (ref 75–133)

## 2011-02-10 LAB — PROTHROMBIN GENE MUTATION

## 2011-02-10 LAB — MAGNESIUM: Magnesium: 1.8

## 2011-02-10 LAB — FACTOR 5 LEIDEN

## 2011-02-10 LAB — PROTEIN C, TOTAL: Protein C, Total: 66 % — ABNORMAL LOW (ref 70–140)

## 2011-02-10 LAB — HOMOCYSTEINE: Homocysteine: 9.9

## 2011-02-10 LAB — ANTITHROMBIN III: AntiThromb III Func: 130 — ABNORMAL HIGH (ref 76–126)

## 2011-02-10 LAB — TROPONIN I

## 2011-02-10 LAB — PROTEIN S ACTIVITY: Protein S Activity: 42 % — ABNORMAL LOW (ref 69–129)

## 2011-04-19 ENCOUNTER — Inpatient Hospital Stay (HOSPITAL_COMMUNITY): Payer: Medicare Other

## 2011-04-19 ENCOUNTER — Inpatient Hospital Stay (HOSPITAL_COMMUNITY)
Admission: AD | Admit: 2011-04-19 | Discharge: 2011-04-19 | Disposition: A | Discharge status: Home / Self Care | Payer: Medicare Other | Source: Ambulatory Visit | Attending: Obstetrics & Gynecology | Admitting: Obstetrics & Gynecology

## 2011-04-19 ENCOUNTER — Encounter (HOSPITAL_COMMUNITY): Payer: Self-pay | Admitting: *Deleted

## 2011-04-19 DIAGNOSIS — N925 Other specified irregular menstruation: Secondary | ICD-10-CM | POA: Insufficient documentation

## 2011-04-19 DIAGNOSIS — N926 Irregular menstruation, unspecified: Secondary | ICD-10-CM

## 2011-04-19 DIAGNOSIS — N939 Abnormal uterine and vaginal bleeding, unspecified: Secondary | ICD-10-CM

## 2011-04-19 DIAGNOSIS — N949 Unspecified condition associated with female genital organs and menstrual cycle: Secondary | ICD-10-CM | POA: Insufficient documentation

## 2011-04-19 DIAGNOSIS — N938 Other specified abnormal uterine and vaginal bleeding: Secondary | ICD-10-CM | POA: Insufficient documentation

## 2011-04-19 DIAGNOSIS — R109 Unspecified abdominal pain: Secondary | ICD-10-CM | POA: Insufficient documentation

## 2011-04-19 HISTORY — DX: Anemia, unspecified: D64.9

## 2011-04-19 HISTORY — DX: Gastro-esophageal reflux disease without esophagitis: K21.9

## 2011-04-19 HISTORY — DX: Sleep apnea, unspecified: G47.30

## 2011-04-19 LAB — URINE MICROSCOPIC-ADD ON

## 2011-04-19 LAB — WET PREP, GENITAL: Trich, Wet Prep: NONE SEEN

## 2011-04-19 LAB — URINALYSIS, ROUTINE W REFLEX MICROSCOPIC
Leukocytes, UA: NEGATIVE
Protein, ur: NEGATIVE mg/dL
Urobilinogen, UA: 0.2 mg/dL (ref 0.0–1.0)

## 2011-04-19 LAB — POCT PREGNANCY, URINE: Preg Test, Ur: NEGATIVE

## 2011-04-19 LAB — CBC
MCHC: 33.8 g/dL (ref 30.0–36.0)
RDW: 15 % (ref 11.5–15.5)

## 2011-04-19 MED ORDER — HYDROCODONE-ACETAMINOPHEN 5-325 MG PO TABS
1.0000 | ORAL_TABLET | Freq: Once | ORAL | Status: AC
  Administered 2011-04-19: 1 via ORAL
  Filled 2011-04-19: qty 1

## 2011-04-19 MED ORDER — HYDROCODONE-ACETAMINOPHEN 5-500 MG PO TABS: 1.0000 | ORAL_TABLET | Freq: Four times a day (QID) | ORAL | Status: AC | PRN

## 2011-04-19 NOTE — Progress Notes (Signed)
Pt states she has had her IUD for 4 yrs never has had a problem with irregular bleeding. Pt states she has been bleeding on and off x3 weeks heavy at times. Intermittent lower abdominal cramping.

## 2011-04-19 NOTE — Progress Notes (Signed)
Patient states she has had a Mirena IUD, this is the 5th year. Started bleeding on 10-28 and has had 2 more episodes of bleeding since. Has some abdominal cramping.

## 2011-06-08 DIAGNOSIS — I80299 Phlebitis and thrombophlebitis of other deep vessels of unspecified lower extremity: Secondary | ICD-10-CM | POA: Diagnosis not present

## 2011-06-08 DIAGNOSIS — I2699 Other pulmonary embolism without acute cor pulmonale: Secondary | ICD-10-CM | POA: Diagnosis not present

## 2011-06-14 DIAGNOSIS — L97209 Non-pressure chronic ulcer of unspecified calf with unspecified severity: Secondary | ICD-10-CM | POA: Diagnosis not present

## 2011-06-14 DIAGNOSIS — L97929 Non-pressure chronic ulcer of unspecified part of left lower leg with unspecified severity: Secondary | ICD-10-CM | POA: Diagnosis not present

## 2011-06-27 DIAGNOSIS — I2699 Other pulmonary embolism without acute cor pulmonale: Secondary | ICD-10-CM | POA: Diagnosis not present

## 2011-06-27 DIAGNOSIS — I80299 Phlebitis and thrombophlebitis of other deep vessels of unspecified lower extremity: Secondary | ICD-10-CM | POA: Diagnosis not present

## 2011-07-11 DIAGNOSIS — I80299 Phlebitis and thrombophlebitis of other deep vessels of unspecified lower extremity: Secondary | ICD-10-CM | POA: Diagnosis not present

## 2011-07-11 DIAGNOSIS — I2699 Other pulmonary embolism without acute cor pulmonale: Secondary | ICD-10-CM | POA: Diagnosis not present

## 2011-07-19 DIAGNOSIS — L97209 Non-pressure chronic ulcer of unspecified calf with unspecified severity: Secondary | ICD-10-CM | POA: Diagnosis not present

## 2011-07-19 DIAGNOSIS — L97929 Non-pressure chronic ulcer of unspecified part of left lower leg with unspecified severity: Secondary | ICD-10-CM | POA: Diagnosis not present

## 2011-08-01 DIAGNOSIS — I80299 Phlebitis and thrombophlebitis of other deep vessels of unspecified lower extremity: Secondary | ICD-10-CM | POA: Diagnosis not present

## 2011-08-01 DIAGNOSIS — I2699 Other pulmonary embolism without acute cor pulmonale: Secondary | ICD-10-CM | POA: Diagnosis not present

## 2011-08-15 DIAGNOSIS — I2699 Other pulmonary embolism without acute cor pulmonale: Secondary | ICD-10-CM | POA: Diagnosis not present

## 2011-08-15 DIAGNOSIS — I80299 Phlebitis and thrombophlebitis of other deep vessels of unspecified lower extremity: Secondary | ICD-10-CM | POA: Diagnosis not present

## 2011-08-23 DIAGNOSIS — L97929 Non-pressure chronic ulcer of unspecified part of left lower leg with unspecified severity: Secondary | ICD-10-CM | POA: Diagnosis not present

## 2011-08-23 DIAGNOSIS — L97209 Non-pressure chronic ulcer of unspecified calf with unspecified severity: Secondary | ICD-10-CM | POA: Diagnosis not present

## 2011-08-30 DIAGNOSIS — L97209 Non-pressure chronic ulcer of unspecified calf with unspecified severity: Secondary | ICD-10-CM | POA: Diagnosis not present

## 2011-08-30 DIAGNOSIS — L97929 Non-pressure chronic ulcer of unspecified part of left lower leg with unspecified severity: Secondary | ICD-10-CM | POA: Diagnosis not present

## 2011-09-05 DIAGNOSIS — I2699 Other pulmonary embolism without acute cor pulmonale: Secondary | ICD-10-CM | POA: Diagnosis not present

## 2011-09-05 DIAGNOSIS — I80299 Phlebitis and thrombophlebitis of other deep vessels of unspecified lower extremity: Secondary | ICD-10-CM | POA: Diagnosis not present

## 2011-09-06 DIAGNOSIS — L97929 Non-pressure chronic ulcer of unspecified part of left lower leg with unspecified severity: Secondary | ICD-10-CM | POA: Diagnosis not present

## 2011-09-06 DIAGNOSIS — L97209 Non-pressure chronic ulcer of unspecified calf with unspecified severity: Secondary | ICD-10-CM | POA: Diagnosis not present

## 2011-09-13 DIAGNOSIS — L97929 Non-pressure chronic ulcer of unspecified part of left lower leg with unspecified severity: Secondary | ICD-10-CM | POA: Diagnosis not present

## 2011-09-13 DIAGNOSIS — L97209 Non-pressure chronic ulcer of unspecified calf with unspecified severity: Secondary | ICD-10-CM | POA: Diagnosis not present

## 2011-09-20 DIAGNOSIS — L97929 Non-pressure chronic ulcer of unspecified part of left lower leg with unspecified severity: Secondary | ICD-10-CM | POA: Diagnosis not present

## 2011-09-20 DIAGNOSIS — L97209 Non-pressure chronic ulcer of unspecified calf with unspecified severity: Secondary | ICD-10-CM | POA: Diagnosis not present

## 2011-09-26 DIAGNOSIS — I80299 Phlebitis and thrombophlebitis of other deep vessels of unspecified lower extremity: Secondary | ICD-10-CM | POA: Diagnosis not present

## 2011-09-26 DIAGNOSIS — I2699 Other pulmonary embolism without acute cor pulmonale: Secondary | ICD-10-CM | POA: Diagnosis not present

## 2011-09-27 DIAGNOSIS — L97929 Non-pressure chronic ulcer of unspecified part of left lower leg with unspecified severity: Secondary | ICD-10-CM | POA: Diagnosis not present

## 2011-09-27 DIAGNOSIS — L97209 Non-pressure chronic ulcer of unspecified calf with unspecified severity: Secondary | ICD-10-CM | POA: Diagnosis not present

## 2011-10-04 DIAGNOSIS — L97209 Non-pressure chronic ulcer of unspecified calf with unspecified severity: Secondary | ICD-10-CM | POA: Diagnosis not present

## 2011-10-04 DIAGNOSIS — L97929 Non-pressure chronic ulcer of unspecified part of left lower leg with unspecified severity: Secondary | ICD-10-CM | POA: Diagnosis not present

## 2011-10-12 DIAGNOSIS — L97929 Non-pressure chronic ulcer of unspecified part of left lower leg with unspecified severity: Secondary | ICD-10-CM | POA: Diagnosis not present

## 2011-10-12 DIAGNOSIS — L97209 Non-pressure chronic ulcer of unspecified calf with unspecified severity: Secondary | ICD-10-CM | POA: Diagnosis not present

## 2011-10-20 DIAGNOSIS — L97209 Non-pressure chronic ulcer of unspecified calf with unspecified severity: Secondary | ICD-10-CM | POA: Diagnosis not present

## 2011-10-20 DIAGNOSIS — L97929 Non-pressure chronic ulcer of unspecified part of left lower leg with unspecified severity: Secondary | ICD-10-CM | POA: Diagnosis not present

## 2011-10-24 DIAGNOSIS — I80299 Phlebitis and thrombophlebitis of other deep vessels of unspecified lower extremity: Secondary | ICD-10-CM | POA: Diagnosis not present

## 2011-10-24 DIAGNOSIS — I2699 Other pulmonary embolism without acute cor pulmonale: Secondary | ICD-10-CM | POA: Diagnosis not present

## 2011-11-15 DIAGNOSIS — L97929 Non-pressure chronic ulcer of unspecified part of left lower leg with unspecified severity: Secondary | ICD-10-CM | POA: Diagnosis not present

## 2011-11-15 DIAGNOSIS — L97209 Non-pressure chronic ulcer of unspecified calf with unspecified severity: Secondary | ICD-10-CM | POA: Diagnosis not present

## 2011-11-21 DIAGNOSIS — I80299 Phlebitis and thrombophlebitis of other deep vessels of unspecified lower extremity: Secondary | ICD-10-CM | POA: Diagnosis not present

## 2011-11-21 DIAGNOSIS — I2699 Other pulmonary embolism without acute cor pulmonale: Secondary | ICD-10-CM | POA: Diagnosis not present

## 2011-11-24 DIAGNOSIS — L83 Acanthosis nigricans: Secondary | ICD-10-CM | POA: Diagnosis not present

## 2011-11-24 DIAGNOSIS — E559 Vitamin D deficiency, unspecified: Secondary | ICD-10-CM | POA: Diagnosis not present

## 2011-11-24 DIAGNOSIS — D649 Anemia, unspecified: Secondary | ICD-10-CM | POA: Diagnosis not present

## 2011-11-24 DIAGNOSIS — I10 Essential (primary) hypertension: Secondary | ICD-10-CM | POA: Diagnosis not present

## 2011-11-24 DIAGNOSIS — L2089 Other atopic dermatitis: Secondary | ICD-10-CM | POA: Diagnosis not present

## 2011-11-24 DIAGNOSIS — L97209 Non-pressure chronic ulcer of unspecified calf with unspecified severity: Secondary | ICD-10-CM | POA: Diagnosis not present

## 2011-11-24 DIAGNOSIS — Z79899 Other long term (current) drug therapy: Secondary | ICD-10-CM | POA: Diagnosis not present

## 2011-11-24 DIAGNOSIS — L97929 Non-pressure chronic ulcer of unspecified part of left lower leg with unspecified severity: Secondary | ICD-10-CM | POA: Diagnosis not present

## 2011-11-24 DIAGNOSIS — R635 Abnormal weight gain: Secondary | ICD-10-CM | POA: Diagnosis not present

## 2011-11-24 DIAGNOSIS — Z980 Intestinal bypass and anastomosis status: Secondary | ICD-10-CM | POA: Diagnosis not present

## 2011-12-13 ENCOUNTER — Encounter: Payer: Self-pay | Admitting: *Deleted

## 2011-12-13 ENCOUNTER — Encounter: Payer: Medicare Other | Attending: Family Medicine | Admitting: *Deleted

## 2011-12-13 DIAGNOSIS — L97209 Non-pressure chronic ulcer of unspecified calf with unspecified severity: Secondary | ICD-10-CM | POA: Diagnosis not present

## 2011-12-13 DIAGNOSIS — L97929 Non-pressure chronic ulcer of unspecified part of left lower leg with unspecified severity: Secondary | ICD-10-CM | POA: Diagnosis not present

## 2011-12-13 DIAGNOSIS — I10 Essential (primary) hypertension: Secondary | ICD-10-CM | POA: Insufficient documentation

## 2011-12-13 DIAGNOSIS — Z713 Dietary counseling and surveillance: Secondary | ICD-10-CM | POA: Diagnosis not present

## 2011-12-13 NOTE — Patient Instructions (Addendum)
Goals:  Eat 3 meals/day, Avoid meal skipping   Increase protein rich foods  Follow "Plate Method" for portion control  Limit carbohydrate1-2 servings/meal   Choose more whole grains, lean protein, low-fat dairy ([place] made with 2% milk; try Laughing cow cheese wedges), and fruits/non-starchy vegetables.   Aim for >20 min of physical activity 4 days/week  Limit sugar-sweetened beverages and concentrated sweets  Limit sodium to 2300 mg/day per handout guidelines  Look for Calorie king book at [employer]

## 2011-12-13 NOTE — Progress Notes (Signed)
  Medical Nutrition Therapy:  Appt start time: 1400 end time:  1500.   Assessment:  Primary concerns today: obesity, HTN.  Patient requested weight loss medication and was referred for nutritional counseling instead.  MEDICATIONS: see list   DIETARY INTAKE:  Usual eating pattern includes 2 meals and multiple late night snacks per day.  Everyday foods include fast food, fatty meats, refined carbohydrates.  Avoided foods include none.    24-hr recall:  B ( AM): coffee with sugar and creamer; eggs and bacon  Snk ( AM): none  L ( PM): skips Snk ( PM): none D ( PM): sausage sandwich.  Doesn't like to cook; chicken sandwich and fries Snk ( PM): oodles of noodles; chips; something sweet Beverages: water  Usual physical activity: walks some 4 days/week for 5 minutes  Estimated energy needs: 1400 calories 158 g carbohydrates 105 g protein 39 g fat  Progress Towards Goal(s):  In progress.   Nutritional Diagnosis:  -3.3 Overweight/obesity As related to large portions of energy-dense foods, late night snacking, meal skipping, and physical inactivity.  As evidenced by BMI of 50.6.    Intervention:  Nutrition counseling provided.  Patient stated she "knows what to do to loose weight." She appeared disengaged for the majority of the appointment.  Discussed MyPlate for meal planning, portion control, limiting fats and carbs, and choosing lean proteins.  Discussed limiting eating out and reducing sodium consumption.  Encouraged physical activity and finding alternative activities when tempted to emotionally eat  Handouts given during visit include:  My meal plan card  MNT for HTN  Reading food labels  Emotional eating  Monitoring/Evaluation:  Dietary intake, exercise,  and body weight in 1 month(s).

## 2011-12-20 DIAGNOSIS — Z980 Intestinal bypass and anastomosis status: Secondary | ICD-10-CM | POA: Diagnosis not present

## 2011-12-20 DIAGNOSIS — I80299 Phlebitis and thrombophlebitis of other deep vessels of unspecified lower extremity: Secondary | ICD-10-CM | POA: Diagnosis not present

## 2011-12-20 DIAGNOSIS — L97209 Non-pressure chronic ulcer of unspecified calf with unspecified severity: Secondary | ICD-10-CM | POA: Diagnosis not present

## 2011-12-20 DIAGNOSIS — L97929 Non-pressure chronic ulcer of unspecified part of left lower leg with unspecified severity: Secondary | ICD-10-CM | POA: Diagnosis not present

## 2011-12-20 DIAGNOSIS — Z79899 Other long term (current) drug therapy: Secondary | ICD-10-CM | POA: Diagnosis not present

## 2011-12-20 DIAGNOSIS — E669 Obesity, unspecified: Secondary | ICD-10-CM | POA: Diagnosis not present

## 2011-12-20 DIAGNOSIS — I2699 Other pulmonary embolism without acute cor pulmonale: Secondary | ICD-10-CM | POA: Diagnosis not present

## 2012-01-10 ENCOUNTER — Ambulatory Visit: Payer: Medicare Other | Admitting: *Deleted

## 2012-01-12 DIAGNOSIS — L97209 Non-pressure chronic ulcer of unspecified calf with unspecified severity: Secondary | ICD-10-CM | POA: Diagnosis not present

## 2012-01-12 DIAGNOSIS — L97929 Non-pressure chronic ulcer of unspecified part of left lower leg with unspecified severity: Secondary | ICD-10-CM | POA: Diagnosis not present

## 2012-01-31 ENCOUNTER — Emergency Department (INDEPENDENT_AMBULATORY_CARE_PROVIDER_SITE_OTHER)
Admission: EM | Admit: 2012-01-31 | Discharge: 2012-01-31 | Disposition: A | Discharge status: Home / Self Care | Payer: Medicare Other | Source: Home / Self Care | Attending: Emergency Medicine | Admitting: Emergency Medicine

## 2012-01-31 ENCOUNTER — Encounter (HOSPITAL_COMMUNITY): Payer: Self-pay | Admitting: *Deleted

## 2012-01-31 DIAGNOSIS — J069 Acute upper respiratory infection, unspecified: Secondary | ICD-10-CM

## 2012-01-31 DIAGNOSIS — Z5181 Encounter for therapeutic drug level monitoring: Secondary | ICD-10-CM

## 2012-01-31 DIAGNOSIS — J3489 Other specified disorders of nose and nasal sinuses: Secondary | ICD-10-CM

## 2012-01-31 DIAGNOSIS — Z7901 Long term (current) use of anticoagulants: Secondary | ICD-10-CM | POA: Diagnosis not present

## 2012-01-31 LAB — PROTIME-INR: Prothrombin Time: 13.2 seconds (ref 11.6–15.2)

## 2012-01-31 MED ORDER — ACETAMINOPHEN 325 MG PO TABS: 650.0000 mg | ORAL_TABLET | Freq: Four times a day (QID) | ORAL | Status: DC | PRN

## 2012-01-31 MED ORDER — SALINE 0.65 % NA SOLN: NASAL | Status: DC

## 2012-01-31 MED ORDER — HYDROCHLOROTHIAZIDE 25 MG PO TABS: 25.0000 mg | ORAL_TABLET | Freq: Every day | ORAL | Status: DC

## 2012-01-31 MED ORDER — WARFARIN SODIUM 10 MG PO TABS
10.0000 mg | ORAL_TABLET | Freq: Once | ORAL | Status: AC
  Administered 2012-01-31: 10 mg via ORAL

## 2012-01-31 MED ORDER — WARFARIN SODIUM 10 MG PO TABS: 10.0000 mg | ORAL_TABLET | Freq: Once | ORAL | Status: DC

## 2012-01-31 MED ORDER — FEXOFENADINE-PSEUDOEPHED ER 60-120 MG PO TB12: 1.0000 | ORAL_TABLET | Freq: Two times a day (BID) | ORAL | Status: DC

## 2012-01-31 NOTE — ED Provider Notes (Signed)
History     CSN: [id number]  Arrival date & time 01/31/12  1048   First MD Initiated Contact with Patient 01/31/12 1118      Chief Complaint  Patient presents with  . Fever  . Facial Pain    (Consider location/radiation/quality/duration/timing/severity/associated sxs/prior treatment) HPI Comments: Patient presents urgent care describing that she's been having a sinus pressure congestion with fevers and runny nose. With some cough for 3 days. Has had some chills and fever she describes. Incidentally she also described that she ran out of Coumadin for one or 2 weeks. At this point denies any pain, of her lower extremities chest pains, or shortness of breath. Describes, as she has been taking Coumadin for about 3 years takes 10 mg daily. She has not had her INR checked for several weeks, have not taking Coumadin as she ran out since  [facility] closed.  Patient is a 41 y.o. female presenting with fever. The history is provided by the patient.  Fever Primary symptoms of the febrile illness include fever and cough. Primary symptoms do not include fatigue, shortness of breath, abdominal pain, altered mental status, myalgias, arthralgias or rash. The current episode started today. This is a new problem.    Past Medical History  Diagnosis Date  . Hypertension   . History of blood clots   . Varicose vein   . Anemia   . Sleep apnea   . GERD (gastroesophageal reflux disease)   . Obesity     Past Surgical History  Procedure Date  . Leg surgery     Right leg  . Gastric bypass     74yrs ago     Family History  Problem Relation Age of Onset  . Kidney disease Mother     kidney transplant    History  Substance Use Topics  . Smoking status: Never Smoker   . Smokeless tobacco: Never Used  . Alcohol Use: No    OB History    Grav Para Term Preterm Abortions TAB SAB Ect Mult Living   0 0 0 0 0 0 0 0 0 0       Review of Systems  Constitutional: Positive for fever. Negative for  activity change, appetite change and fatigue.  HENT: Positive for congestion, sore throat, rhinorrhea and postnasal drip. Negative for ear pain, trouble swallowing, neck pain and voice change.   Eyes: Negative for pain and redness.  Respiratory: Positive for cough. Negative for shortness of breath.   Cardiovascular: Negative for chest pain, palpitations and leg swelling.  Gastrointestinal: Negative for abdominal pain.  Genitourinary: Negative for frequency.  Musculoskeletal: Negative for myalgias and arthralgias.  Skin: Negative for rash.  Psychiatric/Behavioral: Negative for altered mental status.    Allergies  Oxycodone hcl  Home Medications   Current Outpatient Rx  Name Route Sig Dispense Refill  . AMLODIPINE BESYLATE 10 MG PO TABS Oral Take 10 mg by mouth daily.    . ACETAMINOPHEN 325 MG PO TABS Oral Take 2 tablets (650 mg total) by mouth every 6 (six) hours as needed for pain. 30 tablet 0  . FEXOFENADINE-PSEUDOEPHED ER 60-120 MG PO TB12 Oral Take 1 tablet by mouth every 12 (twelve) hours. 30 tablet 0  . HYDROCHLOROTHIAZIDE 25 MG PO TABS Oral Take 1 tablet (25 mg total) by mouth daily. 30 tablet 0  . LISINOPRIL-HYDROCHLOROTHIAZIDE 20-25 MG PO TABS Oral Take 1 tablet by mouth daily.    Marland Kitchen METOPROLOL TARTRATE 25 MG PO TABS Oral Take 25 mg by  mouth daily.      Marland Kitchen SALINE 0.65 % (SOLN) NA SOLN  Each nostril tid prn 50 mL 0  . WARFARIN SODIUM 10 MG PO TABS Oral Take 10 mg by mouth daily.      . WARFARIN SODIUM 10 MG PO TABS Oral Take 1 tablet (10 mg total) by mouth once. 30 tablet 0    BP 145/93  Pulse 69  Temp 97.9 F (36.6 C) (Oral)  Resp 18  SpO2 100%  LMP 01/12/2012  Physical Exam  Nursing note and vitals reviewed. Constitutional: She appears well-developed and well-nourished. No distress.  HENT:  Head: Normocephalic. Head is without right periorbital erythema and without left periorbital erythema.  Mouth/Throat: Uvula is midline. No oropharyngeal exudate, posterior  oropharyngeal edema, posterior oropharyngeal erythema or tonsillar abscesses.  Eyes: Conjunctivae normal are normal.  Neck: No JVD present.  Cardiovascular: Normal rate.   No murmur heard. Pulmonary/Chest: Effort normal and breath sounds normal. She has no decreased breath sounds.  Lymphadenopathy:    She has no cervical adenopathy.  Skin: No rash noted.    ED Course  Procedures (including critical care time)   Labs Reviewed  PROTIME-INR   No results found.   1. Upper respiratory infection   2. Inadequate anticoagulation   3. Sinus pain    #4 treatment noncompliance. Patient has been encouraged to return in 3 days for INR check. While week establish her with a primary care Dr. Larence Penning- internal medicine clinic have been trying to reach patient.   MDM  Patient presented to urgent care with 2 main complaints. #1 with sinus congestion and rhinorrhea and tactile fevers for 3 days (per patient during my interview as opposed to 2 weeks as she expressed to nurse). Patient was prescribed Allegra-D, occurs to use saline nasal spray. 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 On chart review was noted that patient had his last INR checked about 494 days ago. Patient presents describing that she has not had her Coumadin for more than a week. Describes as well as she has no primary care doctor nor she has been told or informed unaware to establish care. Today we have called both family practice and internal medicine, it was reported to [place] by the internal medicine clinic at home and they have been trying to reach her but that her number has been reported as not operational. Our nurse Aragon, is trying to reach the patient to help her schedule a followup visit early next week with the internal medicine clinic. The meantime her INR was checked today and it was 1.0 a dose of Coumadin was provided to her while at [facility] in a prescription of 30 tablets provided to her. Patient has been on  anticoagulation for 3 years and seemed to be that wasn't 10 mg daily for greater than 2 years. Compliance and adherence is questionable at this point. I explained to her what symptoms should warrant further evaluation immediately in the emergency department such as bleeding, chest pains shortness of breath.      Rosana Hoes, MD 01/31/12 [phone]

## 2012-01-31 NOTE — ED Notes (Signed)
Pt is here with complaints of sinus drainage, facial pressure, periorbital pressure, and fever X 2 weeks.  Pt is also on coumadin therapy, former [facility] patient.  Needs INR.

## 2012-01-31 NOTE — ED Notes (Signed)
Dr. Denton Ar asked me to call [facility] and get pt. in for long term anti-coagulation therapy in < 7 days. 1320 I called but they were at lunch.  1339 I called and left a message for RN to call.  1350 I called again and spoke to the appointment person and she transferred me to a Education officer, community.  She asked who checked her INR and when was it last checked?  Pt. said [facility] and it was 3 weeks before they closed on 8/30 ( approx. 8/9). Janette said to try [facility].  1400 I called [facility] and spoke to [employer].  She said this pt. was on their list from [facility] She tried to call this pt. but phone number was not in service.  She asked if pt. can come Fri. or Mon. I went to ask the pt. and she had been discharged. I gave Ms. Fortuna 2 numbers to call.  Dr. Denton Ar notified that [facility] will call pt. and get her scheduled for Fri. or Mon.  She will be able to see Dr. Jerrell Belfast. Roselyn Meier 01/31/2012

## 2012-02-01 DIAGNOSIS — L97929 Non-pressure chronic ulcer of unspecified part of left lower leg with unspecified severity: Secondary | ICD-10-CM | POA: Diagnosis not present

## 2012-02-01 DIAGNOSIS — L97209 Non-pressure chronic ulcer of unspecified calf with unspecified severity: Secondary | ICD-10-CM | POA: Diagnosis not present

## 2012-02-07 DIAGNOSIS — I10 Essential (primary) hypertension: Secondary | ICD-10-CM | POA: Diagnosis not present

## 2012-02-07 DIAGNOSIS — R0609 Other forms of dyspnea: Secondary | ICD-10-CM | POA: Diagnosis not present

## 2012-02-07 DIAGNOSIS — N19 Unspecified kidney failure: Secondary | ICD-10-CM | POA: Diagnosis not present

## 2012-02-07 DIAGNOSIS — E559 Vitamin D deficiency, unspecified: Secondary | ICD-10-CM | POA: Diagnosis not present

## 2012-02-07 DIAGNOSIS — E119 Type 2 diabetes mellitus without complications: Secondary | ICD-10-CM | POA: Diagnosis not present

## 2012-02-07 DIAGNOSIS — I82409 Acute embolism and thrombosis of unspecified deep veins of unspecified lower extremity: Secondary | ICD-10-CM | POA: Diagnosis not present

## 2012-02-07 DIAGNOSIS — Z7901 Long term (current) use of anticoagulants: Secondary | ICD-10-CM | POA: Diagnosis not present

## 2012-02-16 DIAGNOSIS — Z7901 Long term (current) use of anticoagulants: Secondary | ICD-10-CM | POA: Diagnosis not present

## 2012-02-28 DIAGNOSIS — I10 Essential (primary) hypertension: Secondary | ICD-10-CM | POA: Diagnosis not present

## 2012-02-28 DIAGNOSIS — I82409 Acute embolism and thrombosis of unspecified deep veins of unspecified lower extremity: Secondary | ICD-10-CM | POA: Diagnosis not present

## 2012-02-28 DIAGNOSIS — G479 Sleep disorder, unspecified: Secondary | ICD-10-CM | POA: Diagnosis not present

## 2012-03-08 ENCOUNTER — Encounter: Payer: Self-pay | Admitting: Obstetrics & Gynecology

## 2012-03-08 ENCOUNTER — Ambulatory Visit (INDEPENDENT_AMBULATORY_CARE_PROVIDER_SITE_OTHER): Payer: Medicare Other | Admitting: Obstetrics & Gynecology

## 2012-03-08 VITALS — Temp 97.3°F | Ht 68.0 in | Wt 341.5 lb

## 2012-03-08 DIAGNOSIS — Z975 Presence of (intrauterine) contraceptive device: Secondary | ICD-10-CM

## 2012-03-08 DIAGNOSIS — Z3009 Encounter for other general counseling and advice on contraception: Secondary | ICD-10-CM

## 2012-03-08 DIAGNOSIS — Z1231 Encounter for screening mammogram for malignant neoplasm of breast: Secondary | ICD-10-CM

## 2012-03-08 DIAGNOSIS — Z30433 Encounter for removal and reinsertion of intrauterine contraceptive device: Secondary | ICD-10-CM | POA: Diagnosis not present

## 2012-03-08 MED ORDER — LEVONORGESTREL 20 MCG/24HR IU IUD
INTRAUTERINE_SYSTEM | Freq: Once | INTRAUTERINE | Status: AC
  Administered 2012-03-08: 1 via INTRAUTERINE

## 2012-03-08 NOTE — Patient Instructions (Addendum)

## 2012-03-08 NOTE — Progress Notes (Signed)
Patient ID: Alice [name], female   DOB: 04-Feb-1971, 41 y.o.   MRN: [id number] Pt is a 41 yo Para 0 who is currently at the five year mark for her Mirena.  Pt requests to have current IUS removed and a new one inserted. Patient identified, informed consent performed.  Discussed risks of irregular bleeding, cramping, infection, malpositioning or misplacement of the IUD outside the uterus which may require further procedures. Time out was performed.  Urine pregnancy test negative.  Speculum placed in the vagina.  Cervix visualized.  Cleaned with Betadine x 2. IUD strings not noted.  A kelly clamp was used to identify the strings right inside of the cervix. IUD removed without difficulty.  Cervix grasped anteriorly with a single tooth tenaculum.  Uterus sounded to 8 cm.  Mirena IUD placed per manufacturer's recommendations.  Strings trimmed to 3 cm. Tenaculum was removed, good hemostasis noted.  Patient tolerated procedure well.   Patient was given post-procedure instructions and the Mirena care card with expiration date.  Patient was also asked to check IUD strings periodically and follow up in 4-6 weeks for IUD check.  Pt also requested screening mammogram.   F/u 1 year annual or sooner prn  Hulon Ferron L. Harraway-Smith, M.D., Cherlynn June

## 2012-03-13 DIAGNOSIS — L97929 Non-pressure chronic ulcer of unspecified part of left lower leg with unspecified severity: Secondary | ICD-10-CM | POA: Diagnosis not present

## 2012-03-13 DIAGNOSIS — L97209 Non-pressure chronic ulcer of unspecified calf with unspecified severity: Secondary | ICD-10-CM | POA: Diagnosis not present

## 2012-03-20 DIAGNOSIS — L97929 Non-pressure chronic ulcer of unspecified part of left lower leg with unspecified severity: Secondary | ICD-10-CM | POA: Diagnosis not present

## 2012-03-20 DIAGNOSIS — L97209 Non-pressure chronic ulcer of unspecified calf with unspecified severity: Secondary | ICD-10-CM | POA: Diagnosis not present

## 2012-03-21 ENCOUNTER — Ambulatory Visit (HOSPITAL_COMMUNITY)
Admission: RE | Admit: 2012-03-21 | Discharge: 2012-03-21 | Disposition: A | Discharge status: Home / Self Care | Payer: Medicare Other | Source: Ambulatory Visit | Attending: Obstetrics & Gynecology | Admitting: Obstetrics & Gynecology

## 2012-03-21 DIAGNOSIS — Z3009 Encounter for other general counseling and advice on contraception: Secondary | ICD-10-CM

## 2012-03-21 DIAGNOSIS — Z1231 Encounter for screening mammogram for malignant neoplasm of breast: Secondary | ICD-10-CM | POA: Diagnosis not present

## 2012-03-25 ENCOUNTER — Other Ambulatory Visit: Payer: Self-pay | Admitting: Obstetrics & Gynecology

## 2012-03-25 DIAGNOSIS — R928 Other abnormal and inconclusive findings on diagnostic imaging of breast: Secondary | ICD-10-CM

## 2012-03-26 ENCOUNTER — Telehealth: Payer: Self-pay

## 2012-03-26 ENCOUNTER — Other Ambulatory Visit: Payer: Self-pay | Admitting: Obstetrics & Gynecology

## 2012-03-26 DIAGNOSIS — L97209 Non-pressure chronic ulcer of unspecified calf with unspecified severity: Secondary | ICD-10-CM | POA: Diagnosis not present

## 2012-03-26 DIAGNOSIS — R928 Other abnormal and inconclusive findings on diagnostic imaging of breast: Secondary | ICD-10-CM

## 2012-03-26 DIAGNOSIS — L97929 Non-pressure chronic ulcer of unspecified part of left lower leg with unspecified severity: Secondary | ICD-10-CM | POA: Diagnosis not present

## 2012-03-26 NOTE — Telephone Encounter (Signed)
Message copied by Michel Harrow on Mon Mar 26, 2012 12:03 PM ------      Message from: Lavonia Drafts      Created: Sun Mar 25, 2012 12:33 PM       Please call pt.  Needs DIAGNOSTIC mammogram with sono for abnormal mammogram.            I have ordered it in the system but, please check my order to make sure it is correct!            Thx,      clh-S

## 2012-03-26 NOTE — Telephone Encounter (Signed)
[place] and spoke with Noelia who informed me that she has already spoken to the pt about an appt for 04/02/12 @ 250pm.  Pt is aware of appt.

## 2012-04-02 ENCOUNTER — Other Ambulatory Visit: Payer: Medicare Other

## 2012-04-03 DIAGNOSIS — R7301 Impaired fasting glucose: Secondary | ICD-10-CM | POA: Diagnosis not present

## 2012-04-03 DIAGNOSIS — I10 Essential (primary) hypertension: Secondary | ICD-10-CM | POA: Diagnosis not present

## 2012-04-03 DIAGNOSIS — N19 Unspecified kidney failure: Secondary | ICD-10-CM | POA: Diagnosis not present

## 2012-04-03 DIAGNOSIS — Z7901 Long term (current) use of anticoagulants: Secondary | ICD-10-CM | POA: Diagnosis not present

## 2012-04-03 DIAGNOSIS — G4733 Obstructive sleep apnea (adult) (pediatric): Secondary | ICD-10-CM | POA: Diagnosis not present

## 2012-04-03 DIAGNOSIS — E559 Vitamin D deficiency, unspecified: Secondary | ICD-10-CM | POA: Diagnosis not present

## 2012-04-03 DIAGNOSIS — I82409 Acute embolism and thrombosis of unspecified deep veins of unspecified lower extremity: Secondary | ICD-10-CM | POA: Diagnosis not present

## 2012-04-05 ENCOUNTER — Emergency Department (HOSPITAL_COMMUNITY): Payer: Medicare Other

## 2012-04-05 ENCOUNTER — Encounter (HOSPITAL_COMMUNITY): Payer: Self-pay | Admitting: *Deleted

## 2012-04-05 ENCOUNTER — Emergency Department (HOSPITAL_COMMUNITY)
Admission: EM | Admit: 2012-04-05 | Discharge: 2012-04-05 | Discharge status: Against Medical Advice | Payer: Medicare Other | Attending: Emergency Medicine | Admitting: Emergency Medicine

## 2012-04-05 DIAGNOSIS — R059 Cough, unspecified: Secondary | ICD-10-CM | POA: Insufficient documentation

## 2012-04-05 DIAGNOSIS — J3489 Other specified disorders of nose and nasal sinuses: Secondary | ICD-10-CM | POA: Diagnosis not present

## 2012-04-05 DIAGNOSIS — I517 Cardiomegaly: Secondary | ICD-10-CM | POA: Diagnosis not present

## 2012-04-05 DIAGNOSIS — R0602 Shortness of breath: Secondary | ICD-10-CM | POA: Diagnosis not present

## 2012-04-05 DIAGNOSIS — R918 Other nonspecific abnormal finding of lung field: Secondary | ICD-10-CM | POA: Diagnosis not present

## 2012-04-05 DIAGNOSIS — R05 Cough: Secondary | ICD-10-CM | POA: Insufficient documentation

## 2012-04-05 NOTE — ED Notes (Signed)
Pt states she feels better and does not wish to wait any longer, encouraged patient to stay, stated she didn't want to be seen anymore due to feeling better.

## 2012-04-05 NOTE — ED Notes (Addendum)
Pt in c/o cough, congestion and shortness of breath x2 weeks, pt has not seen PMD, states shortness of breath was worse this am. Also c/o facial pain, states she has sinus problems.

## 2012-04-06 ENCOUNTER — Ambulatory Visit
Admission: RE | Admit: 2012-04-06 | Discharge: 2012-04-06 | Disposition: A | Discharge status: Home / Self Care | Payer: Medicare Other | Source: Ambulatory Visit | Attending: Obstetrics & Gynecology | Admitting: Obstetrics & Gynecology

## 2012-04-06 DIAGNOSIS — R928 Other abnormal and inconclusive findings on diagnostic imaging of breast: Secondary | ICD-10-CM

## 2012-04-17 DIAGNOSIS — L97209 Non-pressure chronic ulcer of unspecified calf with unspecified severity: Secondary | ICD-10-CM | POA: Diagnosis not present

## 2012-04-17 DIAGNOSIS — L97929 Non-pressure chronic ulcer of unspecified part of left lower leg with unspecified severity: Secondary | ICD-10-CM | POA: Diagnosis not present

## 2012-05-03 ENCOUNTER — Encounter (HOSPITAL_BASED_OUTPATIENT_CLINIC_OR_DEPARTMENT_OTHER): Payer: Medicare Other

## 2012-05-16 DIAGNOSIS — J189 Pneumonia, unspecified organism: Secondary | ICD-10-CM

## 2012-05-16 HISTORY — DX: Pneumonia, unspecified organism: J18.9

## 2012-05-25 DIAGNOSIS — L97929 Non-pressure chronic ulcer of unspecified part of left lower leg with unspecified severity: Secondary | ICD-10-CM | POA: Diagnosis not present

## 2012-05-25 DIAGNOSIS — L97209 Non-pressure chronic ulcer of unspecified calf with unspecified severity: Secondary | ICD-10-CM | POA: Diagnosis not present

## 2012-06-05 ENCOUNTER — Ambulatory Visit (HOSPITAL_BASED_OUTPATIENT_CLINIC_OR_DEPARTMENT_OTHER): Payer: Medicare Other

## 2012-06-12 DIAGNOSIS — L97209 Non-pressure chronic ulcer of unspecified calf with unspecified severity: Secondary | ICD-10-CM | POA: Diagnosis not present

## 2012-06-12 DIAGNOSIS — L97929 Non-pressure chronic ulcer of unspecified part of left lower leg with unspecified severity: Secondary | ICD-10-CM | POA: Diagnosis not present

## 2012-06-26 DIAGNOSIS — L97929 Non-pressure chronic ulcer of unspecified part of left lower leg with unspecified severity: Secondary | ICD-10-CM | POA: Diagnosis not present

## 2012-06-26 DIAGNOSIS — L97209 Non-pressure chronic ulcer of unspecified calf with unspecified severity: Secondary | ICD-10-CM | POA: Diagnosis not present

## 2012-07-23 DIAGNOSIS — L97209 Non-pressure chronic ulcer of unspecified calf with unspecified severity: Secondary | ICD-10-CM | POA: Diagnosis not present

## 2012-07-23 DIAGNOSIS — L97929 Non-pressure chronic ulcer of unspecified part of left lower leg with unspecified severity: Secondary | ICD-10-CM | POA: Diagnosis not present

## 2012-08-09 DIAGNOSIS — G4733 Obstructive sleep apnea (adult) (pediatric): Secondary | ICD-10-CM | POA: Diagnosis not present

## 2012-08-09 DIAGNOSIS — Z7901 Long term (current) use of anticoagulants: Secondary | ICD-10-CM | POA: Diagnosis not present

## 2012-08-09 DIAGNOSIS — R7301 Impaired fasting glucose: Secondary | ICD-10-CM | POA: Diagnosis not present

## 2012-08-09 DIAGNOSIS — I10 Essential (primary) hypertension: Secondary | ICD-10-CM | POA: Diagnosis not present

## 2012-08-09 DIAGNOSIS — I82409 Acute embolism and thrombosis of unspecified deep veins of unspecified lower extremity: Secondary | ICD-10-CM | POA: Diagnosis not present

## 2012-08-09 DIAGNOSIS — N19 Unspecified kidney failure: Secondary | ICD-10-CM | POA: Diagnosis not present

## 2012-08-09 DIAGNOSIS — K3189 Other diseases of stomach and duodenum: Secondary | ICD-10-CM | POA: Diagnosis not present

## 2012-08-09 DIAGNOSIS — E559 Vitamin D deficiency, unspecified: Secondary | ICD-10-CM | POA: Diagnosis not present

## 2012-08-14 DIAGNOSIS — L97209 Non-pressure chronic ulcer of unspecified calf with unspecified severity: Secondary | ICD-10-CM | POA: Diagnosis not present

## 2012-08-14 DIAGNOSIS — L97929 Non-pressure chronic ulcer of unspecified part of left lower leg with unspecified severity: Secondary | ICD-10-CM | POA: Diagnosis not present

## 2012-08-16 DIAGNOSIS — E739 Lactose intolerance, unspecified: Secondary | ICD-10-CM | POA: Diagnosis not present

## 2012-08-23 DIAGNOSIS — K3189 Other diseases of stomach and duodenum: Secondary | ICD-10-CM | POA: Diagnosis not present

## 2012-09-07 DIAGNOSIS — G4733 Obstructive sleep apnea (adult) (pediatric): Secondary | ICD-10-CM | POA: Diagnosis not present

## 2012-09-07 DIAGNOSIS — I10 Essential (primary) hypertension: Secondary | ICD-10-CM | POA: Diagnosis not present

## 2012-09-07 DIAGNOSIS — R7301 Impaired fasting glucose: Secondary | ICD-10-CM | POA: Diagnosis not present

## 2012-09-07 DIAGNOSIS — I82409 Acute embolism and thrombosis of unspecified deep veins of unspecified lower extremity: Secondary | ICD-10-CM | POA: Diagnosis not present

## 2012-09-07 DIAGNOSIS — K3189 Other diseases of stomach and duodenum: Secondary | ICD-10-CM | POA: Diagnosis not present

## 2012-09-07 DIAGNOSIS — N19 Unspecified kidney failure: Secondary | ICD-10-CM | POA: Diagnosis not present

## 2012-09-07 DIAGNOSIS — Z7901 Long term (current) use of anticoagulants: Secondary | ICD-10-CM | POA: Diagnosis not present

## 2012-09-28 DIAGNOSIS — R7301 Impaired fasting glucose: Secondary | ICD-10-CM | POA: Diagnosis not present

## 2012-09-28 DIAGNOSIS — I10 Essential (primary) hypertension: Secondary | ICD-10-CM | POA: Diagnosis not present

## 2012-09-28 DIAGNOSIS — I82409 Acute embolism and thrombosis of unspecified deep veins of unspecified lower extremity: Secondary | ICD-10-CM | POA: Diagnosis not present

## 2012-09-28 DIAGNOSIS — G4733 Obstructive sleep apnea (adult) (pediatric): Secondary | ICD-10-CM | POA: Diagnosis not present

## 2012-09-28 DIAGNOSIS — L97209 Non-pressure chronic ulcer of unspecified calf with unspecified severity: Secondary | ICD-10-CM | POA: Diagnosis not present

## 2012-09-28 DIAGNOSIS — N19 Unspecified kidney failure: Secondary | ICD-10-CM | POA: Diagnosis not present

## 2012-09-28 DIAGNOSIS — L97929 Non-pressure chronic ulcer of unspecified part of left lower leg with unspecified severity: Secondary | ICD-10-CM | POA: Diagnosis not present

## 2012-09-28 DIAGNOSIS — I83219 Varicose veins of right lower extremity with both ulcer of unspecified site and inflammation: Secondary | ICD-10-CM | POA: Diagnosis not present

## 2012-09-28 DIAGNOSIS — K3189 Other diseases of stomach and duodenum: Secondary | ICD-10-CM | POA: Diagnosis not present

## 2012-09-28 DIAGNOSIS — Z7901 Long term (current) use of anticoagulants: Secondary | ICD-10-CM | POA: Diagnosis not present

## 2012-10-30 DIAGNOSIS — I82409 Acute embolism and thrombosis of unspecified deep veins of unspecified lower extremity: Secondary | ICD-10-CM | POA: Diagnosis not present

## 2012-10-30 DIAGNOSIS — T8130XA Disruption of wound, unspecified, initial encounter: Secondary | ICD-10-CM | POA: Diagnosis not present

## 2012-10-30 DIAGNOSIS — N19 Unspecified kidney failure: Secondary | ICD-10-CM | POA: Diagnosis not present

## 2012-10-30 DIAGNOSIS — R7301 Impaired fasting glucose: Secondary | ICD-10-CM | POA: Diagnosis not present

## 2012-10-30 DIAGNOSIS — Z7901 Long term (current) use of anticoagulants: Secondary | ICD-10-CM | POA: Diagnosis not present

## 2012-10-30 DIAGNOSIS — G4733 Obstructive sleep apnea (adult) (pediatric): Secondary | ICD-10-CM | POA: Diagnosis not present

## 2012-10-30 DIAGNOSIS — I10 Essential (primary) hypertension: Secondary | ICD-10-CM | POA: Diagnosis not present

## 2012-11-30 DIAGNOSIS — R0609 Other forms of dyspnea: Secondary | ICD-10-CM | POA: Diagnosis not present

## 2012-12-14 DIAGNOSIS — L97209 Non-pressure chronic ulcer of unspecified calf with unspecified severity: Secondary | ICD-10-CM | POA: Diagnosis not present

## 2012-12-14 DIAGNOSIS — L97929 Non-pressure chronic ulcer of unspecified part of left lower leg with unspecified severity: Secondary | ICD-10-CM | POA: Diagnosis not present

## 2012-12-17 DIAGNOSIS — I10 Essential (primary) hypertension: Secondary | ICD-10-CM | POA: Diagnosis not present

## 2012-12-17 DIAGNOSIS — N19 Unspecified kidney failure: Secondary | ICD-10-CM | POA: Diagnosis not present

## 2012-12-17 DIAGNOSIS — Z7901 Long term (current) use of anticoagulants: Secondary | ICD-10-CM | POA: Diagnosis not present

## 2012-12-17 DIAGNOSIS — G4733 Obstructive sleep apnea (adult) (pediatric): Secondary | ICD-10-CM | POA: Diagnosis not present

## 2012-12-17 DIAGNOSIS — I82409 Acute embolism and thrombosis of unspecified deep veins of unspecified lower extremity: Secondary | ICD-10-CM | POA: Diagnosis not present

## 2012-12-17 DIAGNOSIS — R7301 Impaired fasting glucose: Secondary | ICD-10-CM | POA: Diagnosis not present

## 2012-12-17 DIAGNOSIS — K3189 Other diseases of stomach and duodenum: Secondary | ICD-10-CM | POA: Diagnosis not present

## 2013-01-12 ENCOUNTER — Emergency Department (HOSPITAL_COMMUNITY)
Admission: EM | Admit: 2013-01-12 | Discharge: 2013-01-12 | Disposition: A | Discharge status: Home / Self Care | Payer: Medicare Other | Attending: Emergency Medicine | Admitting: Emergency Medicine

## 2013-01-12 ENCOUNTER — Emergency Department (HOSPITAL_COMMUNITY): Payer: Medicare Other

## 2013-01-12 ENCOUNTER — Encounter (HOSPITAL_COMMUNITY): Payer: Self-pay | Admitting: Family Medicine

## 2013-01-12 DIAGNOSIS — I10 Essential (primary) hypertension: Secondary | ICD-10-CM | POA: Insufficient documentation

## 2013-01-12 DIAGNOSIS — Z86718 Personal history of other venous thrombosis and embolism: Secondary | ICD-10-CM | POA: Insufficient documentation

## 2013-01-12 DIAGNOSIS — Z9884 Bariatric surgery status: Secondary | ICD-10-CM | POA: Insufficient documentation

## 2013-01-12 DIAGNOSIS — Z975 Presence of (intrauterine) contraceptive device: Secondary | ICD-10-CM | POA: Diagnosis not present

## 2013-01-12 DIAGNOSIS — Z7901 Long term (current) use of anticoagulants: Secondary | ICD-10-CM | POA: Insufficient documentation

## 2013-01-12 DIAGNOSIS — Z8679 Personal history of other diseases of the circulatory system: Secondary | ICD-10-CM | POA: Diagnosis not present

## 2013-01-12 DIAGNOSIS — Z862 Personal history of diseases of the blood and blood-forming organs and certain disorders involving the immune mechanism: Secondary | ICD-10-CM | POA: Diagnosis not present

## 2013-01-12 DIAGNOSIS — Z8639 Personal history of other endocrine, nutritional and metabolic disease: Secondary | ICD-10-CM | POA: Diagnosis not present

## 2013-01-12 DIAGNOSIS — Z79899 Other long term (current) drug therapy: Secondary | ICD-10-CM | POA: Diagnosis not present

## 2013-01-12 DIAGNOSIS — K219 Gastro-esophageal reflux disease without esophagitis: Secondary | ICD-10-CM | POA: Insufficient documentation

## 2013-01-12 DIAGNOSIS — N83209 Unspecified ovarian cyst, unspecified side: Secondary | ICD-10-CM | POA: Diagnosis not present

## 2013-01-12 DIAGNOSIS — K602 Anal fissure, unspecified: Secondary | ICD-10-CM | POA: Diagnosis not present

## 2013-01-12 LAB — POCT I-STAT, CHEM 8
BUN: 16 mg/dL (ref 6–23)
HCT: 39 % (ref 36.0–46.0)
Hemoglobin: 13.3 g/dL (ref 12.0–15.0)
Sodium: 141 mEq/L (ref 135–145)
TCO2: 27 mmol/L (ref 0–100)

## 2013-01-12 LAB — CBC WITH DIFFERENTIAL/PLATELET
Basophils Relative: 1 % (ref 0–1)
Hemoglobin: 12.7 g/dL (ref 12.0–15.0)
Lymphs Abs: 1.9 10*3/uL (ref 0.7–4.0)
MCHC: 34.3 g/dL (ref 30.0–36.0)
Monocytes Relative: 9 % (ref 3–12)
Neutro Abs: 3.8 10*3/uL (ref 1.7–7.7)
Neutrophils Relative %: 58 % (ref 43–77)
RBC: 4.61 MIL/uL (ref 3.87–5.11)

## 2013-01-12 LAB — PROTIME-INR: INR: 3.12 — ABNORMAL HIGH (ref 0.00–1.49)

## 2013-01-12 MED ORDER — LIDOCAINE HCL 2 % EX GEL
Freq: Once | CUTANEOUS | Status: AC
  Administered 2013-01-12: 20 via TOPICAL
  Filled 2013-01-12: qty 20

## 2013-01-12 MED ORDER — IOHEXOL 300 MG/ML  SOLN
100.0000 mL | Freq: Once | INTRAMUSCULAR | Status: AC | PRN
  Administered 2013-01-12: 100 mL via INTRAVENOUS

## 2013-01-12 MED ORDER — MORPHINE SULFATE 4 MG/ML IJ SOLN
6.0000 mg | Freq: Once | INTRAMUSCULAR | Status: AC
  Administered 2013-01-12: 6 mg via INTRAVENOUS
  Filled 2013-01-12: qty 2

## 2013-01-12 MED ORDER — IOHEXOL 300 MG/ML  SOLN
25.0000 mL | INTRAMUSCULAR | Status: DC | PRN
  Administered 2013-01-12: 25 mL via ORAL

## 2013-01-12 MED ORDER — LIDOCAINE 5 % EX OINT: TOPICAL_OINTMENT | CUTANEOUS | Status: DC | PRN

## 2013-01-12 MED ORDER — DOCUSATE SODIUM 100 MG PO CAPS
100.0000 mg | ORAL_CAPSULE | Freq: Once | ORAL | Status: AC
  Administered 2013-01-12: 100 mg via ORAL
  Filled 2013-01-12 (×2): qty 1

## 2013-01-12 MED ORDER — DOCUSATE SODIUM 100 MG PO CAPS: 100.0000 mg | ORAL_CAPSULE | Freq: Two times a day (BID) | ORAL | Status: DC

## 2013-01-12 NOTE — ED Provider Notes (Signed)
CSN: [id number]     Arrival date & time 01/12/13  [id number] History   First MD Initiated Contact with Patient 01/12/13 [phone]     Chief Complaint  Patient presents with  . Abscess   (Consider location/radiation/quality/duration/timing/severity/associated sxs/prior Treatment) HPI Patient is a super morbidly obese middle aged woman with HTN, h/o DVT and chronic anticoagulation who presents with complaints of peri-anal pain x 2 days. Patient has 10/10 pain - particularly after defecation. Pain is stinging - worse on the right side. She has not appreciated a mass. She says "it feels like something is tearing". She was pain free this morning until she had a BM and this triggered pain described above.   She has no history of similar sx. No fever.   Past Medical History  Diagnosis Date  . Hypertension   . History of blood clots   . Varicose vein   . Anemia   . Sleep apnea   . GERD (gastroesophageal reflux disease)   . Obesity    Past Surgical History  Procedure Laterality Date  . Leg surgery      Right leg  . Gastric bypass      81yrs ago    Family History  Problem Relation Age of Onset  . Kidney disease Mother     kidney transplant   History  Substance Use Topics  . Smoking status: Never Smoker   . Smokeless tobacco: Never Used  . Alcohol Use: No   OB History   Grav Para Term Preterm Abortions TAB SAB Ect Mult Living   0 0 0 0 0 0 0 0 0 0      Review of Systems Gen: no weight loss, fevers, chills, night sweats Eyes: no discharge or drainage, no occular pain or visual changes Nose: no epistaxis or rhinorrhea Mouth: no dental pain, no sore throat Neck: no neck pain Lungs: no SOB, cough, wheezing CV: no chest pain, palpitations, dependent edema or orthopnea Abd: no abdominal pain, nausea, vomiting RECTAL: as noted above GU: no dysuria or gross hematuria MSK: no myalgias or arthralgias Neuro: no headache, no focal neurologic deficits Skin: no rash Psyche:  negative.  Allergies  Oxycodone hcl  Home Medications   Current Outpatient Rx  Name  Route  Sig  Dispense  Refill  . acetaminophen (TYLENOL) 325 MG tablet   Oral   Take 2 tablets (650 mg total) by mouth every 6 (six) hours as needed for pain.   30 tablet   0   . amLODipine (NORVASC) 10 MG tablet   Oral   Take 10 mg by mouth daily.         . fexofenadine-pseudoephedrine (ALLEGRA-D) 60-120 MG per tablet   Oral   Take 1 tablet by mouth every 12 (twelve) hours.   30 tablet   0   . hydrochlorothiazide (HYDRODIURIL) 25 MG tablet   Oral   Take 1 tablet (25 mg total) by mouth daily.   30 tablet   0   . lisinopril (PRINIVIL,ZESTRIL) 20 MG tablet   Oral   Take 20 mg by mouth daily.         . metoprolol tartrate (LOPRESSOR) 25 MG tablet   Oral   Take 25 mg by mouth daily.           . Saline (AYR SALINE NASAL DROPS) 0.65 % (SOLN) SOLN      Each nostril tid prn   50 mL   0   . warfarin (COUMADIN) 10 MG tablet  Oral   Take 10 mg by mouth daily.           Marland Kitchen warfarin (COUMADIN) 10 MG tablet   Oral   Take 1 tablet (10 mg total) by mouth once.   30 tablet   0    There were no vitals taken for this visit. Physical Exam Gen: well developed and well nourished appearing, tearful and appears to be in pain Head: NCAT Eyes: PERL, EOMI Nose: no epistaixis or rhinorrhea Mouth/throat: mucosa is moist and pink Neck: normal to inspection Lungs: CTA B, no wheezing, rhonchi or rales CV: RRR, no murmur Abd: soft, nontender, morbidly obese Rectal/anal exam: no perianal abscess observed, the patient is exquisitely tender in the perianal region, no skin tears observed but, exam very difficult due to the patient's hesitancy to comply secondary to pain and also her body habitus, I think that there is some slight fluctuance over the right perirectal wall. Back: no ttp, no cva ttp Skin: no rashese, wnl Neuro: CN ii-xii grossly intact, no focal deficits Psyche; anxious,  cooperative  ED Course  Procedures (including critical care time)  Results for orders placed during the hospital encounter of 01/12/13 (from the past 24 hour(s))  CBC WITH DIFFERENTIAL     Status: None   Collection Time    01/12/13  5:12 AM      Result Value Range   WBC 6.6  4.0 - 10.5 K/uL   RBC 4.61  3.87 - 5.11 MIL/uL   Hemoglobin 12.7  12.0 - 15.0 g/dL   HCT 37.0  36.0 - 46.0 %   MCV 80.3  78.0 - 100.0 fL   MCH 27.5  26.0 - 34.0 pg   MCHC 34.3  30.0 - 36.0 g/dL   RDW 15.4  11.5 - 15.5 %   Platelets 227  150 - 400 K/uL   Neutrophils Relative % 58  43 - 77 %   Neutro Abs 3.8  1.7 - 7.7 K/uL   Lymphocytes Relative 28  12 - 46 %   Lymphs Abs 1.9  0.7 - 4.0 K/uL   Monocytes Relative 9  3 - 12 %   Monocytes Absolute 0.6  0.1 - 1.0 K/uL   Eosinophils Relative 5  0 - 5 %   Eosinophils Absolute 0.3  0.0 - 0.7 K/uL   Basophils Relative 1  0 - 1 %   Basophils Absolute 0.0  0.0 - 0.1 K/uL  PROTIME-INR     Status: Abnormal   Collection Time    01/12/13  5:12 AM      Result Value Range   Prothrombin Time 31.0 (*) 11.6 - 15.2 seconds   INR 3.12 (*) 0.00 - 1.49  POCT I-STAT, CHEM 8     Status: Abnormal   Collection Time    01/12/13  5:27 AM      Result Value Range   Sodium 141  135 - 145 mEq/L   Potassium 3.9  3.5 - 5.1 mEq/L   Chloride 104  96 - 112 mEq/L   BUN 16  6 - 23 mg/dL   Creatinine, Ser 1.20 (*) 0.50 - 1.10 mg/dL   Glucose, Bld 93  70 - 99 mg/dL   Calcium, Ion 1.14  1.12 - 1.23 mmol/L   TCO2 27  0 - 100 mmol/L   Hemoglobin 13.3  12.0 - 15.0 g/dL   HCT 39.0  36.0 - 46.0 %   CT Pelvis W Contrast (Final result)  Result time: 01/12/13 07:45:57  Final result by Rad Results In Interface (01/12/13 07:45:57)    Narrative:   *RADIOLOGY REPORT*  Clinical Data: Obesity, buttock abscess, conserve perirectal abscess  CT PELVIS WITH CONTRAST  Technique: Multidetector CT imaging of the pelvis was performed using the standard protocol following the bolus administration  of intravenous contrast.  Contrast: 169mL OMNIPAQUE IOHEXOL 300 MG/ML SOLN  Comparison: None.  Findings: No pelvic free fluid, fluid collection, hemorrhage, abscess, adenopathy, inguinal abnormality, or hernia. Mildly prominent bilateral inguinal lymph nodes appearing symmetric. Urinary bladder unremarkable. Uterus is midline with an IUD noted in the endometrial cavity. Left ovarian hypodense round cyst noted measures 3.7 x 3.3 cm, image 38. Normal appendix demonstrated. Negative for bowel obstruction, significant dilatation, or focal abnormality.  No gluteal or perirectal fluid collection demonstrated by CT. Degenerative changes of the lower lumbar spine and SI joints.  IMPRESSION: No acute intrapelvic process.  3.7 cm left ovarian cyst  IUD within the uterus  No intrapelvic or soft tissue fluid collection or abscess.   Original Report Authenticated By: Jerilynn Mages. Shick, M.D.       MDM  We have ruled out perirectal and perianal abscess.  I believe that the patient has a rectal fissure causing severe pain s/p bowel movements. We will tx with stool softner and topical lidocaine with referral to PCP for f/u.     Elyn Peers, MD 01/12/13 [phone]

## 2013-01-12 NOTE — ED Notes (Signed)
Pt reports abscess on buttock x2 days.

## 2013-01-12 NOTE — ED Notes (Signed)
CT notified pt finished contrast  

## 2013-01-17 ENCOUNTER — Other Ambulatory Visit (HOSPITAL_COMMUNITY): Payer: Self-pay | Admitting: Emergency Medicine

## 2013-01-22 ENCOUNTER — Emergency Department (HOSPITAL_COMMUNITY): Payer: Medicare Other

## 2013-01-22 ENCOUNTER — Encounter (HOSPITAL_COMMUNITY): Payer: Self-pay | Admitting: *Deleted

## 2013-01-22 ENCOUNTER — Emergency Department (HOSPITAL_COMMUNITY)
Admission: EM | Admit: 2013-01-22 | Discharge: 2013-01-22 | Disposition: A | Discharge status: Home / Self Care | Payer: Medicare Other | Attending: Emergency Medicine | Admitting: Emergency Medicine

## 2013-01-22 DIAGNOSIS — Z79899 Other long term (current) drug therapy: Secondary | ICD-10-CM | POA: Insufficient documentation

## 2013-01-22 DIAGNOSIS — E669 Obesity, unspecified: Secondary | ICD-10-CM | POA: Diagnosis not present

## 2013-01-22 DIAGNOSIS — I10 Essential (primary) hypertension: Secondary | ICD-10-CM | POA: Diagnosis not present

## 2013-01-22 DIAGNOSIS — Z8679 Personal history of other diseases of the circulatory system: Secondary | ICD-10-CM | POA: Insufficient documentation

## 2013-01-22 DIAGNOSIS — Z8719 Personal history of other diseases of the digestive system: Secondary | ICD-10-CM | POA: Insufficient documentation

## 2013-01-22 DIAGNOSIS — K6289 Other specified diseases of anus and rectum: Secondary | ICD-10-CM | POA: Diagnosis not present

## 2013-01-22 DIAGNOSIS — Z7901 Long term (current) use of anticoagulants: Secondary | ICD-10-CM | POA: Diagnosis not present

## 2013-01-22 DIAGNOSIS — Z862 Personal history of diseases of the blood and blood-forming organs and certain disorders involving the immune mechanism: Secondary | ICD-10-CM | POA: Diagnosis not present

## 2013-01-22 MED ORDER — HYDROCORTISONE 2.5 % RE CREA: TOPICAL_CREAM | Freq: Two times a day (BID) | RECTAL | Status: DC

## 2013-01-22 MED ORDER — HYDROCODONE-ACETAMINOPHEN 5-325 MG PO TABS: 2.0000 | ORAL_TABLET | Freq: Once | ORAL | Status: DC

## 2013-01-22 MED ORDER — HYDROCODONE-ACETAMINOPHEN 5-325 MG PO TABS: 2.0000 | ORAL_TABLET | ORAL | Status: DC | PRN

## 2013-01-22 NOTE — ED Provider Notes (Signed)
Medical screening examination/treatment/procedure(s) were performed by non-physician practitioner and as supervising physician I was immediately available for consultation/collaboration.   Ephraim Hamburger, MD 01/22/13 [phone]

## 2013-01-22 NOTE — ED Notes (Signed)
States was seen here on 8/30 for same pain. States she did not follow up as directed. States "i just toughed it out" states pain in rectum. Worse with bm

## 2013-01-22 NOTE — ED Provider Notes (Signed)
CSN: [id number]     Arrival date & time 01/22/13  [id number] History   First MD Initiated Contact with Patient 01/22/13 [phone]     Chief Complaint  Patient presents with  . Rectal Pain   (Consider location/radiation/quality/duration/timing/severity/associated sxs/prior Treatment) HPI Comments: Pt complains of continued rectal pain.   Pt was seen here on 8/30 for the same.  Pt reports she did not follow up for recheck.  Pt has had continued pain.   No bleeding  The history is provided by the patient. No language interpreter was used.    Past Medical History  Diagnosis Date  . Hypertension   . History of blood clots   . Varicose vein   . Anemia   . Sleep apnea   . GERD (gastroesophageal reflux disease)   . Obesity    Past Surgical History  Procedure Laterality Date  . Leg surgery      Right leg  . Gastric bypass      87yrs ago    Family History  Problem Relation Age of Onset  . Kidney disease Mother     kidney transplant   History  Substance Use Topics  . Smoking status: Never Smoker   . Smokeless tobacco: Never Used  . Alcohol Use: No   OB History   Grav Para Term Preterm Abortions TAB SAB Ect Mult Living   0 0 0 0 0 0 0 0 0 0      Review of Systems  Gastrointestinal: Positive for rectal pain.  All other systems reviewed and are negative.    Allergies  Oxycodone hcl  Home Medications   Current Outpatient Rx  Name  Route  Sig  Dispense  Refill  . docusate sodium (COLACE) 100 MG capsule   Oral   Take 1 capsule (100 mg total) by mouth every 12 (twelve) hours.   60 capsule   0   . hydrochlorothiazide (HYDRODIURIL) 25 MG tablet   Oral   Take 1 tablet (25 mg total) by mouth daily.   30 tablet   0   . lidocaine (XYLOCAINE) 5 % ointment   Topical   Apply 1 application topically as needed (boils).         Marland Kitchen lisinopril (PRINIVIL,ZESTRIL) 20 MG tablet   Oral   Take 20 mg by mouth daily.         . phentermine (ADIPEX-P) 37.5 MG tablet   Oral   Take 18.75  mg by mouth daily before breakfast.         . warfarin (COUMADIN) 10 MG tablet   Oral   Take 10 mg by mouth daily.            BP 143/101  Pulse 96  Temp(Src) 97.8 F (36.6 C) (Oral)  Resp 18  SpO2 99% Physical Exam  Nursing note and vitals reviewed. Constitutional: She appears well-developed.  HENT:  Head: Normocephalic and atraumatic.  Cardiovascular: Normal rate.   Pulmonary/Chest: Effort normal.  Abdominal: Soft.  Musculoskeletal: Normal range of motion.  Neurological: She is alert.  Skin: Skin is warm.  Psychiatric: She has a normal mood and affect.    ED Course  Procedures (including critical care time) Labs Review Labs Reviewed - No data to display Imaging Review No results found.  MDM   1. Rectal pain    anusol cream,  Hydrocodone,   Schedule to see the Surgeon for evaluation    Fransico Meadow, PA-C 01/22/13 [id number]

## 2013-01-22 NOTE — ED Notes (Signed)
Pa in to discuss results and answer questions.

## 2013-01-28 DIAGNOSIS — G4733 Obstructive sleep apnea (adult) (pediatric): Secondary | ICD-10-CM | POA: Diagnosis not present

## 2013-01-28 DIAGNOSIS — N19 Unspecified kidney failure: Secondary | ICD-10-CM | POA: Diagnosis not present

## 2013-01-28 DIAGNOSIS — I82409 Acute embolism and thrombosis of unspecified deep veins of unspecified lower extremity: Secondary | ICD-10-CM | POA: Diagnosis not present

## 2013-01-28 DIAGNOSIS — Z7901 Long term (current) use of anticoagulants: Secondary | ICD-10-CM | POA: Diagnosis not present

## 2013-01-28 DIAGNOSIS — K649 Unspecified hemorrhoids: Secondary | ICD-10-CM | POA: Diagnosis not present

## 2013-01-28 DIAGNOSIS — I10 Essential (primary) hypertension: Secondary | ICD-10-CM | POA: Diagnosis not present

## 2013-01-28 DIAGNOSIS — R7301 Impaired fasting glucose: Secondary | ICD-10-CM | POA: Diagnosis not present

## 2013-01-28 DIAGNOSIS — K3189 Other diseases of stomach and duodenum: Secondary | ICD-10-CM | POA: Diagnosis not present

## 2013-02-06 ENCOUNTER — Encounter (INDEPENDENT_AMBULATORY_CARE_PROVIDER_SITE_OTHER): Payer: Self-pay | Admitting: General Surgery

## 2013-02-06 ENCOUNTER — Ambulatory Visit (INDEPENDENT_AMBULATORY_CARE_PROVIDER_SITE_OTHER): Payer: Medicare Other | Admitting: General Surgery

## 2013-02-06 VITALS — BP 136/88 | HR 88 | Temp 98.6°F | Resp 18 | Ht 69.0 in | Wt 337.8 lb

## 2013-02-06 DIAGNOSIS — K602 Anal fissure, unspecified: Secondary | ICD-10-CM | POA: Diagnosis not present

## 2013-02-06 DIAGNOSIS — K644 Residual hemorrhoidal skin tags: Secondary | ICD-10-CM

## 2013-02-06 MED ORDER — HYDROCODONE-ACETAMINOPHEN 5-325 MG PO TABS: 1.0000 | ORAL_TABLET | ORAL | Status: DC | PRN

## 2013-02-06 NOTE — Progress Notes (Signed)
Patient ID: Alice [name], female   DOB: 28-Oct-1970, 42 y.o.   MRN: [id number]  No chief complaint on file.   HPI Alice [name] is a 42 y.o. female.  Chief complaint: Anal pain after each bowel movement HPI I was asked to see her in consultation by Dr. Vista Lawman regarding hemorrhoids versus an anal fissure. For the last month she's had significant pain after each bowel movement with a small amount of bleeding. She was seen in the emergency department. She was found to not have any infections but the pain has not improved. He follows each bowel movement in process for a period of time afterwards. Past Medical History  Diagnosis Date  . Hypertension   . History of blood clots   . Varicose vein   . Anemia   . Sleep apnea   . GERD (gastroesophageal reflux disease)   . Obesity     Past Surgical History  Procedure Laterality Date  . Leg surgery      Right leg  . Gastric bypass      70yrs ago     Family History  Problem Relation Age of Onset  . Kidney disease Mother     kidney transplant    Social History History  Substance Use Topics  . Smoking status: Never Smoker   . Smokeless tobacco: Never Used  . Alcohol Use: No    Allergies  Allergen Reactions  . Oxycodone Hcl Nausea Only    Current Outpatient Prescriptions  Medication Sig Dispense Refill  . docusate sodium (COLACE) 100 MG capsule Take 1 capsule (100 mg total) by mouth every 12 (twelve) hours.  60 capsule  0  . hydrochlorothiazide (HYDRODIURIL) 25 MG tablet Take 1 tablet (25 mg total) by mouth daily.  30 tablet  0  . HYDROcodone-acetaminophen (NORCO/VICODIN) 5-325 MG per tablet Take 1 tablet by mouth every 4 (four) hours as needed for pain.  30 tablet  0  . hydrocortisone (PROCTOSOL HC) 2.5 % rectal cream Place rectally 2 (two) times daily.  30 g  0  . lidocaine (XYLOCAINE) 5 % ointment Apply 1 application topically as needed (boils).      Marland Kitchen lisinopril (PRINIVIL,ZESTRIL) 20 MG tablet Take 20 mg by mouth daily.       . phentermine (ADIPEX-P) 37.5 MG tablet Take 18.75 mg by mouth daily before breakfast.      . warfarin (COUMADIN) 10 MG tablet Take 10 mg by mouth daily.         No current facility-administered medications for this visit.    Review of Systems Review of Systems  Constitutional: Negative for fever, chills and unexpected weight change.  HENT: Negative for hearing loss, congestion, sore throat, trouble swallowing and voice change.   Eyes: Negative for visual disturbance.  Respiratory: Negative for cough and wheezing.   Cardiovascular: Negative for chest pain, palpitations and leg swelling.  Gastrointestinal: Positive for blood in stool and anal bleeding. Negative for nausea, vomiting, abdominal pain, diarrhea, constipation and abdominal distention.       See history of present illness  Genitourinary: Negative for hematuria, vaginal bleeding and difficulty urinating.  Musculoskeletal: Negative for arthralgias.  Skin: Negative for rash and wound.  Neurological: Negative for seizures, syncope and headaches.  Hematological: Negative for adenopathy. Does not bruise/bleed easily.  Psychiatric/Behavioral: Negative for confusion.    Blood pressure 136/88, pulse 88, temperature 98.6 F (37 C), resp. rate 18, height 5\' 9"  (1.753 m), weight 337 lb 12.8 oz (153.225 kg), last menstrual  period 01/15/2013.  Physical Exam Physical Exam  Constitutional: She is oriented to person, place, and time. She appears well-developed and well-nourished. No distress.  HENT:  Head: Normocephalic and atraumatic.  Eyes: EOM are normal. Pupils are equal, round, and reactive to light. No scleral icterus.  Neck: Normal range of motion. Neck supple. No tracheal deviation present.  Cardiovascular: Normal rate, normal heart sounds and intact distal pulses.   Pulmonary/Chest: Effort normal and breath sounds normal. No stridor. No respiratory distress. She has no wheezes. She has no rales.  Abdominal: Soft. Bowel sounds  are normal. She exhibits no distension. There is no tenderness.  Genitourinary:  External anal exam reveals small anal skin tag on the left side, no external hemorrhoids, moderately large posterior midline anal fissure. This is significantly tender & precluded further rectal examination. No active bleeding.  Neurological: She is alert and oriented to person, place, and time.  Skin: Skin is warm.    Data Reviewed Notes from Dr. Vista Lawman  Assessment    Anal fissure, symptomatic    Plan    I refilled her Norco. We will begin diltiazem ointment 4 times a day. I described this treatment process with her. This has about a 70% chance of healing her fissure without needing surgery. I will see her back in 6 weeks       Delane Wessinger E 02/06/2013, 9:49 AM

## 2013-02-19 ENCOUNTER — Encounter (INDEPENDENT_AMBULATORY_CARE_PROVIDER_SITE_OTHER): Payer: Self-pay

## 2013-03-18 DIAGNOSIS — M25569 Pain in unspecified knee: Secondary | ICD-10-CM | POA: Diagnosis not present

## 2013-03-18 DIAGNOSIS — L97929 Non-pressure chronic ulcer of unspecified part of left lower leg with unspecified severity: Secondary | ICD-10-CM | POA: Diagnosis not present

## 2013-03-18 DIAGNOSIS — L97209 Non-pressure chronic ulcer of unspecified calf with unspecified severity: Secondary | ICD-10-CM | POA: Diagnosis not present

## 2013-03-18 DIAGNOSIS — R609 Edema, unspecified: Secondary | ICD-10-CM | POA: Diagnosis not present

## 2013-03-18 DIAGNOSIS — I10 Essential (primary) hypertension: Secondary | ICD-10-CM | POA: Diagnosis not present

## 2013-03-19 ENCOUNTER — Other Ambulatory Visit (HOSPITAL_COMMUNITY): Payer: Self-pay | Admitting: Orthopedic Surgery

## 2013-03-19 DIAGNOSIS — I872 Venous insufficiency (chronic) (peripheral): Secondary | ICD-10-CM

## 2013-03-20 ENCOUNTER — Encounter (INDEPENDENT_AMBULATORY_CARE_PROVIDER_SITE_OTHER): Payer: Medicare Other | Admitting: General Surgery

## 2013-03-21 ENCOUNTER — Ambulatory Visit (HOSPITAL_COMMUNITY)
Admission: RE | Admit: 2013-03-21 | Discharge: 2013-03-21 | Disposition: A | Discharge status: Home / Self Care | Payer: Medicare Other | Source: Ambulatory Visit | Attending: Cardiovascular Disease | Admitting: Cardiovascular Disease

## 2013-03-21 DIAGNOSIS — I872 Venous insufficiency (chronic) (peripheral): Secondary | ICD-10-CM | POA: Diagnosis not present

## 2013-03-21 DIAGNOSIS — M79609 Pain in unspecified limb: Secondary | ICD-10-CM | POA: Diagnosis not present

## 2013-03-21 NOTE — Progress Notes (Signed)
Lower Extremity Venous Duplex Completed. °Brianna L Mazza,RVT °

## 2013-03-22 ENCOUNTER — Other Ambulatory Visit (HOSPITAL_COMMUNITY): Payer: Self-pay | Admitting: Internal Medicine

## 2013-03-22 DIAGNOSIS — Z5181 Encounter for therapeutic drug level monitoring: Secondary | ICD-10-CM | POA: Diagnosis not present

## 2013-03-22 DIAGNOSIS — Z1231 Encounter for screening mammogram for malignant neoplasm of breast: Secondary | ICD-10-CM

## 2013-03-22 DIAGNOSIS — R7301 Impaired fasting glucose: Secondary | ICD-10-CM | POA: Diagnosis not present

## 2013-03-22 DIAGNOSIS — Z7901 Long term (current) use of anticoagulants: Secondary | ICD-10-CM | POA: Diagnosis not present

## 2013-03-22 DIAGNOSIS — N19 Unspecified kidney failure: Secondary | ICD-10-CM | POA: Diagnosis not present

## 2013-03-22 DIAGNOSIS — I10 Essential (primary) hypertension: Secondary | ICD-10-CM | POA: Diagnosis not present

## 2013-03-22 DIAGNOSIS — K3189 Other diseases of stomach and duodenum: Secondary | ICD-10-CM | POA: Diagnosis not present

## 2013-03-22 DIAGNOSIS — I82409 Acute embolism and thrombosis of unspecified deep veins of unspecified lower extremity: Secondary | ICD-10-CM | POA: Diagnosis not present

## 2013-03-22 DIAGNOSIS — G4733 Obstructive sleep apnea (adult) (pediatric): Secondary | ICD-10-CM | POA: Diagnosis not present

## 2013-03-22 DIAGNOSIS — K649 Unspecified hemorrhoids: Secondary | ICD-10-CM | POA: Diagnosis not present

## 2013-03-22 DIAGNOSIS — E559 Vitamin D deficiency, unspecified: Secondary | ICD-10-CM | POA: Diagnosis not present

## 2013-03-22 DIAGNOSIS — F329 Major depressive disorder, single episode, unspecified: Secondary | ICD-10-CM | POA: Diagnosis not present

## 2013-04-09 ENCOUNTER — Ambulatory Visit (HOSPITAL_COMMUNITY)
Admission: RE | Admit: 2013-04-09 | Discharge: 2013-04-09 | Disposition: A | Discharge status: Home / Self Care | Payer: Medicare Other | Source: Ambulatory Visit | Attending: Internal Medicine | Admitting: Internal Medicine

## 2013-04-09 DIAGNOSIS — Z1231 Encounter for screening mammogram for malignant neoplasm of breast: Secondary | ICD-10-CM | POA: Insufficient documentation

## 2013-04-17 ENCOUNTER — Encounter (INDEPENDENT_AMBULATORY_CARE_PROVIDER_SITE_OTHER): Payer: Medicare Other | Admitting: General Surgery

## 2013-05-16 ENCOUNTER — Encounter (HOSPITAL_COMMUNITY): Payer: Self-pay | Admitting: Emergency Medicine

## 2013-05-16 ENCOUNTER — Inpatient Hospital Stay (HOSPITAL_COMMUNITY)
Admission: EM | Admit: 2013-05-16 | Discharge: 2013-05-20 | DRG: 871 | Disposition: A | Discharge status: Home / Self Care | Payer: Medicare Other | Attending: Internal Medicine | Admitting: Internal Medicine

## 2013-05-16 ENCOUNTER — Emergency Department (INDEPENDENT_AMBULATORY_CARE_PROVIDER_SITE_OTHER)
Admission: EM | Admit: 2013-05-16 | Discharge: 2013-05-16 | Disposition: A | Discharge status: Hospice | Payer: Medicare Other | Source: Home / Self Care

## 2013-05-16 ENCOUNTER — Emergency Department (HOSPITAL_COMMUNITY): Payer: Medicare Other

## 2013-05-16 DIAGNOSIS — Z86718 Personal history of other venous thrombosis and embolism: Secondary | ICD-10-CM | POA: Diagnosis present

## 2013-05-16 DIAGNOSIS — A419 Sepsis, unspecified organism: Principal | ICD-10-CM | POA: Diagnosis present

## 2013-05-16 DIAGNOSIS — D508 Other iron deficiency anemias: Secondary | ICD-10-CM | POA: Diagnosis present

## 2013-05-16 DIAGNOSIS — Z23 Encounter for immunization: Secondary | ICD-10-CM

## 2013-05-16 DIAGNOSIS — R6510 Systemic inflammatory response syndrome (SIRS) of non-infectious origin without acute organ dysfunction: Secondary | ICD-10-CM | POA: Diagnosis not present

## 2013-05-16 DIAGNOSIS — K219 Gastro-esophageal reflux disease without esophagitis: Secondary | ICD-10-CM | POA: Diagnosis present

## 2013-05-16 DIAGNOSIS — J309 Allergic rhinitis, unspecified: Secondary | ICD-10-CM | POA: Diagnosis not present

## 2013-05-16 DIAGNOSIS — Z86711 Personal history of pulmonary embolism: Secondary | ICD-10-CM | POA: Diagnosis present

## 2013-05-16 DIAGNOSIS — I82409 Acute embolism and thrombosis of unspecified deep veins of unspecified lower extremity: Secondary | ICD-10-CM | POA: Diagnosis not present

## 2013-05-16 DIAGNOSIS — N1832 Chronic kidney disease, stage 3b: Secondary | ICD-10-CM | POA: Diagnosis present

## 2013-05-16 DIAGNOSIS — Z7901 Long term (current) use of anticoagulants: Secondary | ICD-10-CM

## 2013-05-16 DIAGNOSIS — J1100 Influenza due to unidentified influenza virus with unspecified type of pneumonia: Secondary | ICD-10-CM | POA: Diagnosis present

## 2013-05-16 DIAGNOSIS — N390 Urinary tract infection, site not specified: Secondary | ICD-10-CM | POA: Diagnosis present

## 2013-05-16 DIAGNOSIS — E876 Hypokalemia: Secondary | ICD-10-CM | POA: Diagnosis present

## 2013-05-16 DIAGNOSIS — D509 Iron deficiency anemia, unspecified: Secondary | ICD-10-CM | POA: Diagnosis not present

## 2013-05-16 DIAGNOSIS — R0609 Other forms of dyspnea: Secondary | ICD-10-CM | POA: Diagnosis not present

## 2013-05-16 DIAGNOSIS — D638 Anemia in other chronic diseases classified elsewhere: Secondary | ICD-10-CM | POA: Diagnosis present

## 2013-05-16 DIAGNOSIS — Z79899 Other long term (current) drug therapy: Secondary | ICD-10-CM | POA: Diagnosis not present

## 2013-05-16 DIAGNOSIS — J9600 Acute respiratory failure, unspecified whether with hypoxia or hypercapnia: Secondary | ICD-10-CM | POA: Diagnosis present

## 2013-05-16 DIAGNOSIS — N179 Acute kidney failure, unspecified: Secondary | ICD-10-CM | POA: Diagnosis not present

## 2013-05-16 DIAGNOSIS — Z6841 Body Mass Index (BMI) 40.0 and over, adult: Secondary | ICD-10-CM | POA: Diagnosis not present

## 2013-05-16 DIAGNOSIS — R0989 Other specified symptoms and signs involving the circulatory and respiratory systems: Secondary | ICD-10-CM | POA: Diagnosis not present

## 2013-05-16 DIAGNOSIS — G4730 Sleep apnea, unspecified: Secondary | ICD-10-CM | POA: Diagnosis present

## 2013-05-16 DIAGNOSIS — R0602 Shortness of breath: Secondary | ICD-10-CM | POA: Diagnosis not present

## 2013-05-16 DIAGNOSIS — B965 Pseudomonas (aeruginosa) (mallei) (pseudomallei) as the cause of diseases classified elsewhere: Secondary | ICD-10-CM | POA: Diagnosis present

## 2013-05-16 DIAGNOSIS — I959 Hypotension, unspecified: Secondary | ICD-10-CM | POA: Diagnosis present

## 2013-05-16 DIAGNOSIS — I2699 Other pulmonary embolism without acute cor pulmonale: Secondary | ICD-10-CM | POA: Diagnosis not present

## 2013-05-16 DIAGNOSIS — Z885 Allergy status to narcotic agent status: Secondary | ICD-10-CM | POA: Diagnosis not present

## 2013-05-16 DIAGNOSIS — R0603 Acute respiratory distress: Secondary | ICD-10-CM

## 2013-05-16 DIAGNOSIS — E860 Dehydration: Secondary | ICD-10-CM | POA: Diagnosis present

## 2013-05-16 DIAGNOSIS — I10 Essential (primary) hypertension: Secondary | ICD-10-CM | POA: Diagnosis present

## 2013-05-16 DIAGNOSIS — Z9884 Bariatric surgery status: Secondary | ICD-10-CM | POA: Diagnosis not present

## 2013-05-16 DIAGNOSIS — J111 Influenza due to unidentified influenza virus with other respiratory manifestations: Secondary | ICD-10-CM | POA: Diagnosis not present

## 2013-05-16 DIAGNOSIS — D631 Anemia in chronic kidney disease: Secondary | ICD-10-CM | POA: Diagnosis present

## 2013-05-16 DIAGNOSIS — J189 Pneumonia, unspecified organism: Secondary | ICD-10-CM | POA: Diagnosis present

## 2013-05-16 DIAGNOSIS — K602 Anal fissure, unspecified: Secondary | ICD-10-CM | POA: Diagnosis not present

## 2013-05-16 DIAGNOSIS — J101 Influenza due to other identified influenza virus with other respiratory manifestations: Secondary | ICD-10-CM | POA: Diagnosis present

## 2013-05-16 DIAGNOSIS — R000 Tachycardia, unspecified: Secondary | ICD-10-CM | POA: Diagnosis not present

## 2013-05-16 DIAGNOSIS — N183 Chronic kidney disease, stage 3 (moderate): Secondary | ICD-10-CM

## 2013-05-16 LAB — CBC WITH DIFFERENTIAL/PLATELET
Basophils Absolute: 0 10*3/uL (ref 0.0–0.1)
Basophils Relative: 0 % (ref 0–1)
EOS PCT: 0 % (ref 0–5)
Eosinophils Absolute: 0 10*3/uL (ref 0.0–0.7)
HEMATOCRIT: 35.4 % — AB (ref 36.0–46.0)
HEMOGLOBIN: 12.3 g/dL (ref 12.0–15.0)
LYMPHS ABS: 1.4 10*3/uL (ref 0.7–4.0)
LYMPHS PCT: 18 % (ref 12–46)
MCH: 28.6 pg (ref 26.0–34.0)
MCHC: 34.7 g/dL (ref 30.0–36.0)
MCV: 82.3 fL (ref 78.0–100.0)
MONO ABS: 1.2 10*3/uL — AB (ref 0.1–1.0)
MONOS PCT: 16 % — AB (ref 3–12)
Neutro Abs: 5 10*3/uL (ref 1.7–7.7)
Neutrophils Relative %: 66 % (ref 43–77)
PLATELETS: 180 10*3/uL (ref 150–400)
RBC: 4.3 MIL/uL (ref 3.87–5.11)
RDW: 14.9 % (ref 11.5–15.5)
WBC: 7.6 10*3/uL (ref 4.0–10.5)

## 2013-05-16 LAB — BASIC METABOLIC PANEL
BUN: 38 mg/dL — AB (ref 6–23)
CALCIUM: 8.7 mg/dL (ref 8.4–10.5)
CO2: 19 meq/L (ref 19–32)
Chloride: 100 mEq/L (ref 96–112)
Creatinine, Ser: 3.64 mg/dL — ABNORMAL HIGH (ref 0.50–1.10)
GFR calc Af Amer: 17 mL/min — ABNORMAL LOW (ref >90)
GFR calc non Af Amer: 14 mL/min — ABNORMAL LOW (ref >90)
GLUCOSE: 104 mg/dL — AB (ref 70–99)
Potassium: 3.1 mEq/L — ABNORMAL LOW (ref 3.7–5.3)
Sodium: 138 mEq/L (ref 137–147)

## 2013-05-16 LAB — POCT I-STAT TROPONIN I: Troponin i, poc: 0.04 ng/mL (ref 0.00–0.08)

## 2013-05-16 LAB — CG4 I-STAT (LACTIC ACID): LACTIC ACID, VENOUS: 2.71 mmol/L — AB (ref 0.5–2.2)

## 2013-05-16 LAB — PROTIME-INR
INR: 1.09 (ref 0.00–1.49)
Prothrombin Time: 13.9 seconds (ref 11.6–15.2)

## 2013-05-16 LAB — APTT: aPTT: 29 seconds (ref 24–37)

## 2013-05-16 MED ORDER — ALBUTEROL SULFATE (2.5 MG/3ML) 0.083% IN NEBU
INHALATION_SOLUTION | RESPIRATORY_TRACT | Status: AC
  Filled 2013-05-16: qty 6

## 2013-05-16 MED ORDER — IPRATROPIUM BROMIDE 0.02 % IN SOLN
0.5000 mg | Freq: Once | RESPIRATORY_TRACT | Status: AC
  Administered 2013-05-16: 0.5 mg via RESPIRATORY_TRACT

## 2013-05-16 MED ORDER — AZITHROMYCIN 500 MG PO TABS
500.0000 mg | ORAL_TABLET | ORAL | Status: DC
  Administered 2013-05-16 – 2013-05-18 (×3): 500 mg via ORAL
  Filled 2013-05-16 (×2): qty 1
  Filled 2013-05-16: qty 2
  Filled 2013-05-16: qty 1

## 2013-05-16 MED ORDER — HEPARIN BOLUS VIA INFUSION
5000.0000 [IU] | Freq: Once | INTRAVENOUS | Status: AC
  Administered 2013-05-16: 5000 [IU] via INTRAVENOUS
  Filled 2013-05-16: qty 5000

## 2013-05-16 MED ORDER — SODIUM CHLORIDE 0.9 % IV SOLN
Freq: Once | INTRAVENOUS | Status: AC
  Administered 2013-05-16: 17:00:00 via INTRAVENOUS

## 2013-05-16 MED ORDER — SODIUM CHLORIDE 0.9 % IV BOLUS (SEPSIS)
1000.0000 mL | Freq: Once | INTRAVENOUS | Status: AC
  Administered 2013-05-16: 1000 mL via INTRAVENOUS

## 2013-05-16 MED ORDER — HEPARIN (PORCINE) IN NACL 100-0.45 UNIT/ML-% IJ SOLN
1200.0000 [IU]/h | INTRAMUSCULAR | Status: DC
  Administered 2013-05-16 – 2013-05-17 (×2): 1700 [IU]/h via INTRAVENOUS
  Administered 2013-05-18 (×2): 1200 [IU]/h via INTRAVENOUS
  Filled 2013-05-16 (×5): qty 250

## 2013-05-16 MED ORDER — ONDANSETRON 4 MG PO TBDP
ORAL_TABLET | ORAL | Status: AC
  Filled 2013-05-16: qty 2

## 2013-05-16 MED ORDER — ALBUTEROL SULFATE (5 MG/ML) 0.5% IN NEBU
5.0000 mg | INHALATION_SOLUTION | Freq: Once | RESPIRATORY_TRACT | Status: AC
  Administered 2013-05-16: 5 mg via RESPIRATORY_TRACT

## 2013-05-16 MED ORDER — ACETAMINOPHEN 325 MG PO TABS
650.0000 mg | ORAL_TABLET | Freq: Once | ORAL | Status: AC
  Administered 2013-05-16: 650 mg via ORAL
  Filled 2013-05-16: qty 2

## 2013-05-16 MED ORDER — DEXTROSE 5 % IV SOLN
1.0000 g | INTRAVENOUS | Status: DC
  Administered 2013-05-16 – 2013-05-18 (×3): 1 g via INTRAVENOUS
  Filled 2013-05-16 (×4): qty 10

## 2013-05-16 MED ORDER — IPRATROPIUM BROMIDE 0.02 % IN SOLN
RESPIRATORY_TRACT | Status: AC
  Filled 2013-05-16: qty 2.5

## 2013-05-16 NOTE — ED Provider Notes (Signed)
Medical screening examination/treatment/procedure(s) were performed by non-physician practitioner and as supervising physician I was immediately available for consultation/collaboration.  Philipp Deputy, M.D.   Harden Mo, MD 05/16/13 2022

## 2013-05-16 NOTE — ED Provider Notes (Signed)
CSN: [id number]     Arrival date & time 05/16/13  1603 History   None    Chief Complaint  Patient presents with  . 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 This has been present for 2 days. She is very short of breath and feels very weak. This feels similar to her previous pulmonary embolism. She also admits to a wound on her leg that has been there for "years." No history of anxiety.  Patient is a 43 y.o. female presenting with URI.  URI Presenting symptoms: cough   Presenting symptoms: no fever   Associated symptoms: no arthralgias and no myalgias     Past Medical History  Diagnosis Date  . Hypertension   . History of blood clots   . Varicose vein   . Anemia   . Sleep apnea   . GERD (gastroesophageal reflux disease)   . Obesity    Past Surgical History  Procedure Laterality Date  . Leg surgery      Right leg  . Gastric bypass      57yrs ago    Family History  Problem Relation Age of Onset  . Kidney disease Mother     kidney transplant   History  Substance Use Topics  . Smoking status: Never Smoker   . Smokeless tobacco: Never Used  . Alcohol Use: No   OB History   Grav Para Term Preterm Abortions TAB SAB Ect Mult Living   0 0 0 0 0 0 0 0 0 0      Review of Systems  Constitutional: Negative for fever and chills.  Eyes: Negative for visual disturbance.  Respiratory: Positive for cough and shortness of breath.   Cardiovascular: Positive for chest pain. Negative for palpitations and leg swelling.  Gastrointestinal: Negative for nausea, vomiting and abdominal pain.  Endocrine: Negative for polydipsia and polyuria.  Genitourinary: Negative for dysuria, urgency and frequency.  Musculoskeletal: Negative for  arthralgias and myalgias.  Skin: Negative for rash.  Neurological: Positive for weakness. Negative for dizziness.    Allergies  Oxycodone hcl  Home Medications   Current Outpatient Rx  Name  Route  Sig  Dispense  Refill  . diltiazem 2 % GEL   Topical   Apply 1 application topically 4 (four) times daily.         Marland Kitchen docusate sodium (COLACE) 100 MG capsule   Oral   Take 1 capsule (100 mg total) by mouth every 12 (twelve) hours.   60 capsule   0   . hydrochlorothiazide (HYDRODIURIL) 25 MG tablet   Oral   Take 1 tablet (25 mg total) by mouth daily.   30 tablet   0   . HYDROcodone-acetaminophen (NORCO/VICODIN) 5-325 MG per tablet   Oral   Take 1 tablet by mouth every 4 (four) hours as needed for pain.   30 tablet   0   . hydrocortisone (PROCTOSOL HC) 2.5 % rectal cream   Rectal   Place rectally 2 (two) times daily.   30 g   0   . lidocaine (XYLOCAINE) 5 % ointment   Topical   Apply 1 application topically as needed (boils).         Marland Kitchen lisinopril (PRINIVIL,ZESTRIL) 20 MG tablet   Oral   Take 20 mg by mouth daily.         Marland Kitchen  phentermine (ADIPEX-P) 37.5 MG tablet   Oral   Take 18.75 mg by mouth daily before breakfast.         . warfarin (COUMADIN) 10 MG tablet   Oral   Take 10 mg by mouth daily.            BP 97/78  Pulse 104  Temp(Src) 99.7 F (37.6 C) (Oral)  Resp 24  SpO2 94%  LMP 04/15/2013 Physical Exam  Nursing note and vitals reviewed. Constitutional: She is oriented to person, place, and time. Vital signs are normal. She appears well-developed and well-nourished. No distress.  HENT:  Head: Normocephalic and atraumatic.  Cardiovascular: Tachycardia present.   Pulmonary/Chest: Accessory muscle usage present. Tachypnea noted. She is in respiratory distress. She has decreased breath sounds (diffuse).  Neurological: She is alert and oriented to person, place, and time. She has normal strength. Coordination normal.  Skin: Skin is warm and dry.  No rash noted. She is not diaphoretic.  Psychiatric: She has a normal mood and affect. Judgment normal.    ED Course  Procedures (including critical care time) Labs Review Labs Reviewed - No data to display Imaging Review No results found.    MDM   1. Respiratory distress    Hypotensive, tachycardic, hypoxemic, low-grade fever. Tachypnea at 30 breaths per minute. History of DVT and PE x2, suspect PE with hemodynamic instability. Started on supportive therapy with oxygen, duoneb, IV access obtained, transferred to ED via [place].  EKG unremarkable     Liam Graham, PA-C 05/16/13 1704

## 2013-05-16 NOTE — ED Notes (Signed)
Pt from [facility] via [facility] with c/o of shortness of breath, sent here for PE evaluation.  Hx of DVT and PE, HTN, anemia.  Pt A&O.

## 2013-05-16 NOTE — ED Notes (Signed)
Dr. Darl Householder wants BiPAP to be removed and non rebreather to be placed onto patient. Respiratory paged to remove BiPAP.

## 2013-05-16 NOTE — Progress Notes (Signed)
ANTICOAGULATION CONSULT NOTE - Initial Consult  Pharmacy Consult for heparin gtt Indication: suspected pulmonary embolus  Allergies  Allergen Reactions  . Oxycodone Hcl Nausea Only    Patient Measurements: Height: 5' 8.9" (175 cm) Weight: 339 lb 8.1 oz (154 kg) IBW/kg (Calculated) : 65.97 Heparin Dosing Weight: 104 kg  Vital Signs: Temp: 101.3 F (38.5 C) (01/01 1722) Temp src: Oral (01/01 1722) BP: 91/55 mmHg (01/01 1848) Pulse Rate: 102 (01/01 1848)  Labs:  Recent Labs  05/16/13 1800  HGB 12.3  HCT 35.4*  PLT 180  APTT 29  LABPROT 13.9  INR 1.09  CREATININE 3.64*    Estimated Creatinine Clearance: 32.5 ml/min (by C-G formula based on Cr of 3.64).   Medical History: Past Medical History  Diagnosis Date  . Hypertension   . History of blood clots   . Varicose vein   . Anemia   . Sleep apnea   . GERD (gastroesophageal reflux disease)   . Obesity    Assessment: 43 yo F with prior history of DVT and PE, on lifelong Coumadin, presents to ED complaining of SOB, cough, pleuritic pain, n/v, and diarrhea.  Symptoms began 2 days prior.  Pharmacy is consulted to start heparin gtt for suspected PE.    Patient admission INR is subtherapeutic at 1.09.  She states her last dose of coumadin was a couple days ago and reports no major bleeding issues, only a "boil" on her lower extremity.  SCr is 3.64 with an estimated CrCl ~33.    Goal of Therapy:  Heparin level 0.3-0.7 units/ml Monitor platelets by anticoagulation protocol: Yes   Plan:  - give heparin IV bolus 5000 units, followed by 1700 units/hr - draw 8h HL - daily HL and CBC - monitor for s/s of bleeding - f/u restart of oral anticoagulation  Ovid Curd E. Jacqlyn Larsen, PharmD Clinical Pharmacist - Resident Pager: [phone] Pharmacy: [phone] 05/16/2013 7:29 PM

## 2013-05-16 NOTE — ED Notes (Signed)
BiPAP removed and non-rebreather placed on patient. O2 sats 100% RR 13

## 2013-05-16 NOTE — ED Notes (Signed)
C/o cold sx States headache, hot/chills, nausea, vomit, diarrhea, weakness and sob oTC medications taking but no relief

## 2013-05-16 NOTE — H&P (Addendum)
[facility] History and Physical  Alice [name] [id number] DOB: 04-13-1971 DOA: 05/16/2013  Referring physician: ED physician PCP: Benito Mccreedy, MD   Chief Complaint: fever and shortness of breath   HPI:  Pt is 43 yo female who presents to [facility] ED with main concern of 2 days history of progressively worsening shortness of breat associated with productive cough of yellow sputum, fevers, chills, poor oral intake, malaise. She denies specific alleviating or aggravating factors, no sick contacts or exposures, no similar events in the past. She went to [facility] and was found to be tachypneic with RR in 30's and tachycardic with HR in low 100's. Due to respiratory distress she was sent to [facility] ED. 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 She reports compliance with medications. She denies chest pain other than the discomfort that occurs with coughing spells. No specific abdominal or urinary concerns.   In ED, pt noted to be in mild to moderate respiratory distress with tachypnea and tachycardia and initially requiring BiPAP. She has responded well and was placed on NRB. BMP with K 3.1, Cr above baseline of 1.5 (in DE Cr > 3). TRH asked to admit to SDU for further evaluation and management.   Assessment and Plan:  Acute hypoxic respiratory failure - unclear etiology at this time - will place order for admission to SDU - since has history of PE, will proceed with V/Q scan, ? Compliance with Coumadin given normal INR - will also provide supportive care with NRB or oxygen via [place] - place on BD's scheduled and as needed  - sputum analysis, strep pneumo and urine legionella - influenza panel by PCR ordered  - place on empiric ABX Zithromax and Rocephin until results of the above tests available  SIRS, with early sepsis - given tachycardia, hypotension, elevated lactic acid - unclear source - blood cultures ordered - place on IVF, hold BP medications for now lisinopril and  HCTZ - monitor in SDU, repeat lactic acid   History of DVT, PE - ? Compliance, INR is 1.09 - will place order for V/Q scan  - place on Coumadin per pharmacy, Heparin bridging  Hypokalemia - secondary to poor oral intake  - will supplement and repeat BMP in AM - check Mg level  Acute on chronic renal failure - likely pre renal in etiology imposed on CKD - will place on IVF for now - hold lisinopril and HCTZ - BMP in AM  Code Status: Full Family Communication: Pt at bedside Disposition Plan: Admit to SDU   Review of Systems:  Constitutional: Negative for diaphoresis.  HENT: Negative for hearing loss, ear pain, nosebleeds, congestion, sore throat, neck pain, tinnitus and ear discharge.   Eyes: Negative for blurred vision, double vision, photophobia, pain, discharge and redness.  Respiratory: Negative for wheezing and stridor.   Cardiovascular: Negative for palpitations, orthopnea, claudication and leg swelling.  Gastrointestinal:  Negative for heartburn, constipation, blood in stool and melena.  Genitourinary: Negative for dysuria, urgency, frequency, hematuria and flank pain.  Musculoskeletal: Negative for myalgias, back pain, joint pain and falls.  Skin: Negative for itching and rash.  Neurological: Negative for tingling, tremors, sensory change, speech change, focal weakness, loss of consciousness and headaches.  Endo/Heme/Allergies: Negative for environmental allergies and polydipsia. Does not bruise/bleed easily.  Psychiatric/Behavioral: Negative for suicidal ideas. The patient is not nervous/anxious.      Past Medical History  Diagnosis Date  . Hypertension   . History of blood  clots   . Varicose vein   . Anemia   . Sleep apnea   . GERD (gastroesophageal reflux disease)   . Obesity     Past Surgical History  Procedure Laterality Date  . Leg surgery      Right leg  . Gastric bypass      57yrs ago     Social History:  reports that she has never smoked. She has  never used smokeless tobacco. She reports that she does not drink alcohol or use illicit drugs.  Allergies  Allergen Reactions  . Oxycodone Hcl Nausea Only    Family History  Problem Relation Age of Onset  . Kidney disease Mother     kidney transplant    Prior to Admission medications   Medication Sig Start Date End Date Taking? Authorizing Provider  diltiazem 2 % GEL Apply 1 application topically 4 (four) times daily.   Yes Historical Provider, MD  docusate sodium (COLACE) 100 MG capsule Take 1 capsule (100 mg total) by mouth every 12 (twelve) hours. 01/12/13  Yes Elyn Peers, MD  hydrochlorothiazide (HYDRODIURIL) 25 MG tablet Take 1 tablet (25 mg total) by mouth daily. 01/31/12  Yes Rosana Hoes, MD  HYDROcodone-acetaminophen (NORCO/VICODIN) 5-325 MG per tablet Take 1 tablet by mouth every 4 (four) hours as needed for pain. 02/06/13  Yes Zenovia Jarred, MD  hydrocortisone (PROCTOSOL HC) 2.5 % rectal cream Place rectally 2 (two) times daily. 01/22/13  Yes Hollace Kinnier Sofia, PA-C  lidocaine (XYLOCAINE) 5 % ointment Apply 1 application topically as needed (boils).   Yes Historical Provider, MD  lisinopril (PRINIVIL,ZESTRIL) 20 MG tablet Take 20 mg by mouth daily.   Yes Historical Provider, MD  phentermine (ADIPEX-P) 37.5 MG tablet Take 18.75 mg by mouth daily before breakfast.   Yes Historical Provider, MD  warfarin (COUMADIN) 10 MG tablet Take 10 mg by mouth daily.     Yes Historical Provider, MD    Physical Exam: Filed Vitals:   05/16/13 1845 05/16/13 1848 05/16/13 1900 05/16/13 1947  BP: 93/61 91/55  103/57  Pulse: 98 102  91  Temp:      TempSrc:      Resp: 22 17  21   Height:   5' 8.9" (1.75 m)   Weight:   154 kg (339 lb 8.1 oz)   SpO2: 100% 100%  100%    Physical Exam  Constitutional: Appears well-developed and well-nourished. No distress.  HENT: Normocephalic. External right and left ear normal. Dry MM Eyes: Conjunctivae and EOM are normal. PERRLA, no scleral icterus.  Neck:  Normal ROM. Neck supple. No JVD. No tracheal deviation. No thyromegaly.  CVS: RRR, S1/S2 +, no murmurs, no gallops, no carotid bruit.  Pulmonary: Course breath sounds with mild rhonchi and diminished air movement at bases,   Abdominal: Soft. BS +,  no distension, tenderness, rebound or guarding.  Musculoskeletal: Normal range of motion. No edema and no tenderness.  Lymphadenopathy: No lymphadenopathy noted, cervical, inguinal. Neuro: Alert. Normal reflexes, muscle tone coordination. No cranial nerve deficit. Skin: Skin is warm and dry. No rash noted. Not diaphoretic. No erythema. No pallor.  Psychiatric: Normal mood and affect. Behavior, judgment, thought content normal.   Labs on Admission:  Basic Metabolic Panel:  Recent Labs Lab 05/16/13 1800  NA 138  K 3.1*  CL 100  CO2 19  GLUCOSE 104*  BUN 38*  CREATININE 3.64*  CALCIUM 8.7   CBC:  Recent Labs Lab 05/16/13 1800  WBC 7.6  NEUTROABS  5.0  HGB 12.3  HCT 35.4*  MCV 82.3  PLT 180   Radiological Exams on Admission: Dg Chest Portable 1 View   05/16/2013  No active disease.     EKG: Normal sinus rhythm, no ST/T wave changes  Taleya Ramsay, MD  [facility] Pager [phone]  If 7PM-7AM, please contact night-coverage [email or web address] Password [facility] 05/16/2013, 7:51 PM

## 2013-05-16 NOTE — ED Provider Notes (Signed)
CSN: [id number]     Arrival date & time 05/16/13  1707 History   First MD Initiated Contact with Patient 05/16/13 1709     Chief Complaint  Patient presents with  . Respiratory Distress   (Consider location/radiation/quality/duration/timing/severity/associated sxs/prior Treatment) HPI Comments: Patient presents today from [facility] due to concern for PE.  Patient reports that she has been feeling short of breath and coughing for the past 2 weeks.  Symptoms worsened two days ago.  Today she was seen at [facility] and she was found to have tachypnea, tachycardia, and to be in respiratory distress.  Patient has a history of DVT x 2 and PE.  She is currently on Coumadin and reports that she has been compliant with the medication.  She has not missed any doses.  She reports that she is having some right sided chest pain for the past couple of days when she coughs.  No pain except for with coughing.  Pain does not radiate.  She denies hemoptysis.  Denies lower extremity edema.  She denies any history of COPD or Asthma.  She denies fever or chills.  However, patient found to have a fever of 101.3 upon arrival in the ED.  She denies any prolonged travel or surgeries in the past 4 weeks.    The history is provided by the patient.    Past Medical History  Diagnosis Date  . Hypertension   . History of blood clots   . Varicose vein   . Anemia   . Sleep apnea   . GERD (gastroesophageal reflux disease)   . Obesity    Past Surgical History  Procedure Laterality Date  . Leg surgery      Right leg  . Gastric bypass      57yrs ago    Family History  Problem Relation Age of Onset  . Kidney disease Mother     kidney transplant   History  Substance Use Topics  . Smoking status: Never Smoker   . Smokeless tobacco: Never Used  . Alcohol Use: No   OB History   Grav Para Term Preterm Abortions TAB SAB Ect Mult Living   0 0 0 0 0 0 0 0 0 0      Review of Systems  Respiratory: Positive for cough and shortness of  breath.   Cardiovascular: Positive for chest pain.  All other systems reviewed and are negative.    Allergies  Oxycodone hcl  Home Medications   Current Outpatient Rx  Name  Route  Sig  Dispense  Refill  . diltiazem 2 % GEL   Topical   Apply 1 application topically 4 (four) times daily.         Marland Kitchen docusate sodium (COLACE) 100 MG capsule   Oral   Take 1 capsule (100 mg total) by mouth every 12 (twelve) hours.   60 capsule   0   . hydrochlorothiazide (HYDRODIURIL) 25 MG tablet   Oral   Take 1 tablet (25 mg total) by mouth daily.   30 tablet   0   . HYDROcodone-acetaminophen (NORCO/VICODIN) 5-325 MG per tablet   Oral   Take 1 tablet by mouth every 4 (four) hours as needed for pain.   30 tablet   0   . hydrocortisone (PROCTOSOL HC) 2.5 % rectal cream   Rectal   Place rectally 2 (two) times daily.   30 g   0   . lidocaine (XYLOCAINE) 5 % ointment   Topical  Apply 1 application topically as needed (boils).         Marland Kitchen lisinopril (PRINIVIL,ZESTRIL) 20 MG tablet   Oral   Take 20 mg by mouth daily.         . phentermine (ADIPEX-P) 37.5 MG tablet   Oral   Take 18.75 mg by mouth daily before breakfast.         . warfarin (COUMADIN) 10 MG tablet   Oral   Take 10 mg by mouth daily.            BP 114/67  Pulse 112  Temp(Src) 101.3 F (38.5 C) (Oral)  Resp 30  SpO2 98%  LMP 04/15/2013 Physical Exam  Nursing note and vitals reviewed. Constitutional: She appears well-developed and well-nourished.  HENT:  Head: Normocephalic and atraumatic.  Mouth/Throat: Oropharynx is clear and moist.  Neck: Normal range of motion. Neck supple.  Cardiovascular: Normal rate, regular rhythm and normal heart sounds.   Pulmonary/Chest: Accessory muscle usage present. Tachypnea noted. She has decreased breath sounds. She has no wheezes. She has no rhonchi. She exhibits tenderness.  Patient with choppy sentences due to difficulty breathing.  Shallow breath sounds.   Abdominal: Soft. There is no tenderness.  Musculoskeletal: Normal range of motion.  No lower extremity edema bilaterally  Neurological: She is alert.  Skin: Skin is warm and dry. She is not diaphoretic.  Psychiatric: She has a normal mood and affect.    ED Course  Procedures (including critical care time) Labs Review Labs Reviewed  CBC WITH DIFFERENTIAL  BASIC METABOLIC PANEL  PROTIME-INR  APTT   Imaging Review Dg Chest Port 1 View  05/17/2013   CLINICAL DATA:  Shortness of breath.  EXAM: PORTABLE CHEST - 1 VIEW  COMPARISON:  05/16/2013  FINDINGS: The heart size and pulmonary vascularity are normal. The lungs are clear considering a very shallow inspiration. No effusions. No acute osseous abnormality.  IMPRESSION: No acute abnormalities.   Electronically Signed   By: Rozetta Nunnery M.D.   On: 05/17/2013 08:42   Dg Chest Portable 1 View  05/16/2013   CLINICAL DATA:  Shortness of breath.  Respiratory distress.  EXAM: PORTABLE CHEST - 1 VIEW  COMPARISON:  04/05/2012  FINDINGS: The heart size and mediastinal contours are within normal limits. Both lungs are clear. The visualized skeletal structures are unremarkable.  IMPRESSION: No active disease.   Electronically Signed   By: Earle Gell M.D.   On: 05/16/2013 17:41    EKG Interpretation    Date/Time:    Ventricular Rate:    PR Interval:    QRS Duration:   QT Interval:    QTC Calculation:   R Axis:     Text Interpretation:             5:45 PM Reassessed patient.  Patient currently on BiPap.  Breathing improved.  Pulse ox 100.     6:20 PM Reassessed patient.  Patient currently on Bipap.  She reports that her shortness of breath has improved.  Current oxygen sat is 100 on RA.  7:30 PM Reassessed patient.  Patient currently on a non rebreather.  Oxygen sats 100.  She reports that her shortness of breath has been improving.  Patient discussed with Dr. Doyle Askew with [facility] who has agreed to admit the  patient.  CRITICAL CARE Performed by: Hyman Bible   Total critical care time: 30 minutes  Critical care time was exclusive of separately billable procedures and treating other patients.  Critical care  was necessary to treat or prevent imminent or life-threatening deterioration.  Critical care was time spent personally by me on the following activities: development of treatment plan with patient and/or surrogate as well as nursing, discussions with consultants, evaluation of patient's response to treatment, examination of patient, obtaining history from patient or surrogate, ordering and performing treatments and interventions, ordering and review of laboratory studies, ordering and review of radiographic studies, pulse oximetry and re-evaluation of patient's condition. MDM  No diagnosis found. Patient presents today due to shortness of breath.  Patient found to be febrile upon arrival.  Patient in respiratory distress upon arrival.  She was tachypnic, tachycardic, and using accessory muscles to breath.  Patient placed on Bipap and breathing improved.  Patient then transitioned to non rebreather and did well.  CXR is negative.  Influenza testing pending.  Blood cultures also pending.  Patient has a history of DVT x 2 and PE.  She is currently on Coumadin, but her INR was found to be subtherapeutic today.  Unable to obtain CT angio chest at this time due to acute renal failure.  Patient then started on Heparin due to strong suspicion for PE.  Patient admitted to step down by [facility] for further management.      Hyman Bible, PA-C 05/17/13 1041

## 2013-05-17 ENCOUNTER — Inpatient Hospital Stay (HOSPITAL_COMMUNITY): Payer: Medicare Other

## 2013-05-17 DIAGNOSIS — I959 Hypotension, unspecified: Secondary | ICD-10-CM | POA: Diagnosis present

## 2013-05-17 DIAGNOSIS — N1832 Chronic kidney disease, stage 3b: Secondary | ICD-10-CM | POA: Diagnosis present

## 2013-05-17 DIAGNOSIS — E876 Hypokalemia: Secondary | ICD-10-CM | POA: Diagnosis present

## 2013-05-17 DIAGNOSIS — N183 Chronic kidney disease, stage 3 (moderate): Secondary | ICD-10-CM

## 2013-05-17 DIAGNOSIS — J9600 Acute respiratory failure, unspecified whether with hypoxia or hypercapnia: Secondary | ICD-10-CM

## 2013-05-17 DIAGNOSIS — R6510 Systemic inflammatory response syndrome (SIRS) of non-infectious origin without acute organ dysfunction: Secondary | ICD-10-CM | POA: Diagnosis present

## 2013-05-17 DIAGNOSIS — N179 Acute kidney failure, unspecified: Secondary | ICD-10-CM

## 2013-05-17 DIAGNOSIS — D631 Anemia in chronic kidney disease: Secondary | ICD-10-CM | POA: Diagnosis present

## 2013-05-17 DIAGNOSIS — J189 Pneumonia, unspecified organism: Secondary | ICD-10-CM | POA: Diagnosis present

## 2013-05-17 DIAGNOSIS — J111 Influenza due to unidentified influenza virus with other respiratory manifestations: Secondary | ICD-10-CM

## 2013-05-17 DIAGNOSIS — J101 Influenza due to other identified influenza virus with other respiratory manifestations: Secondary | ICD-10-CM | POA: Diagnosis present

## 2013-05-17 LAB — CBC
HCT: 34.4 % — ABNORMAL LOW (ref 36.0–46.0)
Hemoglobin: 12 g/dL (ref 12.0–15.0)
MCH: 29.3 pg (ref 26.0–34.0)
MCHC: 34.9 g/dL (ref 30.0–36.0)
MCV: 84.1 fL (ref 78.0–100.0)
Platelets: 178 10*3/uL (ref 150–400)
RBC: 4.09 MIL/uL (ref 3.87–5.11)
RDW: 14.9 % (ref 11.5–15.5)
WBC: 6.7 10*3/uL (ref 4.0–10.5)

## 2013-05-17 LAB — URINALYSIS, ROUTINE W REFLEX MICROSCOPIC
BILIRUBIN URINE: NEGATIVE
Glucose, UA: NEGATIVE mg/dL
KETONES UR: NEGATIVE mg/dL
Leukocytes, UA: NEGATIVE
NITRITE: NEGATIVE
Protein, ur: 30 mg/dL — AB
Specific Gravity, Urine: 1.01 (ref 1.005–1.030)
Urobilinogen, UA: 0.2 mg/dL (ref 0.0–1.0)
pH: 5.5 (ref 5.0–8.0)

## 2013-05-17 LAB — MAGNESIUM: Magnesium: 1.9 mg/dL (ref 1.5–2.5)

## 2013-05-17 LAB — URINE MICROSCOPIC-ADD ON

## 2013-05-17 LAB — PRO B NATRIURETIC PEPTIDE: PRO B NATRI PEPTIDE: 54 pg/mL (ref 0–125)

## 2013-05-17 LAB — BASIC METABOLIC PANEL
BUN: 39 mg/dL — AB (ref 6–23)
CALCIUM: 8.4 mg/dL (ref 8.4–10.5)
CO2: 21 meq/L (ref 19–32)
Chloride: 103 mEq/L (ref 96–112)
Creatinine, Ser: 2.99 mg/dL — ABNORMAL HIGH (ref 0.50–1.10)
GFR calc Af Amer: 21 mL/min — ABNORMAL LOW (ref >90)
GFR calc non Af Amer: 18 mL/min — ABNORMAL LOW (ref >90)
GLUCOSE: 111 mg/dL — AB (ref 70–99)
Potassium: 3.2 mEq/L — ABNORMAL LOW (ref 3.7–5.3)
SODIUM: 139 meq/L (ref 137–147)

## 2013-05-17 LAB — EXPECTORATED SPUTUM ASSESSMENT W REFEX TO RESP CULTURE

## 2013-05-17 LAB — HIV ANTIBODY (ROUTINE TESTING W REFLEX): HIV: NONREACTIVE

## 2013-05-17 LAB — STREP PNEUMONIAE URINARY ANTIGEN: STREP PNEUMO URINARY ANTIGEN: POSITIVE — AB

## 2013-05-17 LAB — INFLUENZA PANEL BY PCR (TYPE A & B)
H1N1FLUPCR: NOT DETECTED
INFLAPCR: POSITIVE — AB
Influenza B By PCR: NEGATIVE

## 2013-05-17 LAB — EXPECTORATED SPUTUM ASSESSMENT W GRAM STAIN, RFLX TO RESP C

## 2013-05-17 LAB — TSH: TSH: 2.405 u[IU]/mL (ref 0.350–4.500)

## 2013-05-17 LAB — GRAM STAIN

## 2013-05-17 LAB — PHOSPHORUS: Phosphorus: 3.1 mg/dL (ref 2.3–4.6)

## 2013-05-17 LAB — RAPID URINE DRUG SCREEN, HOSP PERFORMED
Amphetamines: NOT DETECTED
Barbiturates: NOT DETECTED
Benzodiazepines: NOT DETECTED
COCAINE: NOT DETECTED
OPIATES: NOT DETECTED
TETRAHYDROCANNABINOL: NOT DETECTED

## 2013-05-17 LAB — CREATININE, URINE, RANDOM: CREATININE, URINE: 83.44 mg/dL

## 2013-05-17 LAB — HEPARIN LEVEL (UNFRACTIONATED)
HEPARIN UNFRACTIONATED: 0.67 [IU]/mL (ref 0.30–0.70)
Heparin Unfractionated: 0.9 IU/mL — ABNORMAL HIGH (ref 0.30–0.70)

## 2013-05-17 LAB — LACTIC ACID, PLASMA: Lactic Acid, Venous: 1.2 mmol/L (ref 0.5–2.2)

## 2013-05-17 LAB — SODIUM, URINE, RANDOM: SODIUM UR: 64 meq/L

## 2013-05-17 LAB — MRSA PCR SCREENING: MRSA BY PCR: NEGATIVE

## 2013-05-17 MED ORDER — HYDROMORPHONE HCL PF 1 MG/ML IJ SOLN
1.0000 mg | INTRAMUSCULAR | Status: DC | PRN
  Administered 2013-05-17 – 2013-05-19 (×3): 1 mg via INTRAVENOUS
  Filled 2013-05-17 (×4): qty 1

## 2013-05-17 MED ORDER — GUAIFENESIN ER 600 MG PO TB12
1200.0000 mg | ORAL_TABLET | Freq: Two times a day (BID) | ORAL | Status: DC
  Administered 2013-05-17 – 2013-05-20 (×7): 1200 mg via ORAL
  Filled 2013-05-17 (×8): qty 2

## 2013-05-17 MED ORDER — SODIUM CHLORIDE 0.9 % IV SOLN
INTRAVENOUS | Status: DC
  Administered 2013-05-17 – 2013-05-19 (×3): via INTRAVENOUS

## 2013-05-17 MED ORDER — OSELTAMIVIR PHOSPHATE 30 MG PO CAPS
30.0000 mg | ORAL_CAPSULE | Freq: Two times a day (BID) | ORAL | Status: DC
  Administered 2013-05-17 – 2013-05-18 (×3): 30 mg via ORAL
  Filled 2013-05-17 (×5): qty 1

## 2013-05-17 MED ORDER — WARFARIN - PHARMACIST DOSING INPATIENT
Freq: Every day | Status: DC
  Administered 2013-05-19: 18:00:00

## 2013-05-17 MED ORDER — ALBUTEROL SULFATE (2.5 MG/3ML) 0.083% IN NEBU: 2.5000 mg | INHALATION_SOLUTION | RESPIRATORY_TRACT | Status: DC | PRN

## 2013-05-17 MED ORDER — DOCUSATE SODIUM 100 MG PO CAPS
100.0000 mg | ORAL_CAPSULE | Freq: Two times a day (BID) | ORAL | Status: DC
  Administered 2013-05-19 – 2013-05-20 (×3): 100 mg via ORAL
  Filled 2013-05-17 (×4): qty 1

## 2013-05-17 MED ORDER — PHENTERMINE HCL 37.5 MG PO TABS: 18.7500 mg | ORAL_TABLET | Freq: Every day | ORAL | Status: DC

## 2013-05-17 MED ORDER — OSELTAMIVIR PHOSPHATE 75 MG PO CAPS
75.0000 mg | ORAL_CAPSULE | Freq: Two times a day (BID) | ORAL | Status: DC
  Filled 2013-05-17 (×2): qty 1

## 2013-05-17 MED ORDER — POTASSIUM CHLORIDE CRYS ER 20 MEQ PO TBCR
40.0000 meq | EXTENDED_RELEASE_TABLET | Freq: Once | ORAL | Status: AC
  Administered 2013-05-17: 40 meq via ORAL
  Filled 2013-05-17: qty 2

## 2013-05-17 MED ORDER — PANTOPRAZOLE SODIUM 40 MG PO TBEC
40.0000 mg | DELAYED_RELEASE_TABLET | Freq: Every day | ORAL | Status: DC
  Administered 2013-05-17 – 2013-05-20 (×4): 40 mg via ORAL
  Filled 2013-05-17 (×4): qty 1

## 2013-05-17 MED ORDER — POTASSIUM CHLORIDE CRYS ER 20 MEQ PO TBCR
40.0000 meq | EXTENDED_RELEASE_TABLET | ORAL | Status: AC
  Administered 2013-05-17 (×2): 40 meq via ORAL
  Filled 2013-05-17 (×2): qty 2

## 2013-05-17 MED ORDER — HYDROCORTISONE 2.5 % RE CREA
TOPICAL_CREAM | Freq: Two times a day (BID) | RECTAL | Status: DC
  Administered 2013-05-18 – 2013-05-20 (×3): via RECTAL
  Filled 2013-05-17: qty 28.35

## 2013-05-17 MED ORDER — PROMETHAZINE HCL 25 MG/ML IJ SOLN
12.5000 mg | Freq: Four times a day (QID) | INTRAMUSCULAR | Status: DC | PRN
  Administered 2013-05-17: 12.5 mg via INTRAVENOUS
  Filled 2013-05-17 (×2): qty 1

## 2013-05-17 MED ORDER — WARFARIN SODIUM 10 MG PO TABS
10.0000 mg | ORAL_TABLET | Freq: Once | ORAL | Status: AC
  Administered 2013-05-17: 10 mg via ORAL
  Filled 2013-05-17: qty 1

## 2013-05-17 MED ORDER — ALBUTEROL SULFATE (2.5 MG/3ML) 0.083% IN NEBU
2.5000 mg | INHALATION_SOLUTION | Freq: Four times a day (QID) | RESPIRATORY_TRACT | Status: DC
  Administered 2013-05-17 – 2013-05-18 (×6): 2.5 mg via RESPIRATORY_TRACT
  Filled 2013-05-17 (×14): qty 3

## 2013-05-17 NOTE — Significant Event (Signed)
Received call from Pharmacy regarding heparin level. Per pharmacist, to decrease heparin drip to 1500units/hour. RN adjusted rate per pharmacy. Will continue to monitor. Alice [name], Therapist, sports.

## 2013-05-17 NOTE — Progress Notes (Signed)
[facility] for heparin/Coumadin Indication: suspected pulmonary embolus  Allergies  Allergen Reactions  . Oxycodone Hcl Nausea Only    Patient Measurements: Height: 5\' 8"  (172.7 cm) Weight: 333 lb 8.9 oz (151.3 kg) IBW/kg (Calculated) : 63.9 Heparin Dosing Weight: 104 kg  Vital Signs: Temp: 97.8 F (36.6 C) (01/02 0100) Temp src: Oral (01/02 0100) BP: 92/36 mmHg (01/02 0400) Pulse Rate: 85 (01/02 0400)  Labs:  Recent Labs  05/16/13 1800 05/17/13 0335  HGB 12.3 12.0  HCT 35.4* 34.4*  PLT 180 178  APTT 29  --   LABPROT 13.9  --   INR 1.09  --   HEPARINUNFRC  --  0.67  CREATININE 3.64* 2.99*    Estimated Creatinine Clearance: 38.7 ml/min (by C-G formula based on Cr of 2.99).  Assessment: 43 yo female with possible PE, h/o DVT/PE with subtherapeutic INR, for anticoagulation  Goal of Therapy:  Heparin level 0.3-0.7 units/ml Monitor platelets by anticoagulation protocol: Yes   Plan:  Continue Heparin at current rate Recheck level this afternoon to confirm  Coumadin 10 mg today  Phillis Knack, PharmD, BCPS  05/17/2013 5:27 AM

## 2013-05-17 NOTE — ED Provider Notes (Signed)
Medical screening examination/treatment/procedure(s) were conducted as a shared visit with non-physician practitioner(s) and myself.  I personally evaluated the patient during the encounter.  EKG Interpretation    Date/Time:    Ventricular Rate:    PR Interval:    QRS Duration:   QT Interval:    QTC Calculation:   R Axis:     Text Interpretation:              Alice [name] is a 43 y.o. female hx of DVT and PE on coumadin here with cough and fever. Cough and congestion and shortness of breath for 2 days. Sent from urgent care. She is febrile, tachycardic and tachypneic on arrival. No obvious wheezing on exam but is in moderate respiratory distress. I was concerned about flu vs pneumonia vs PE. She was placed on bipap initially and improved so was placed on non rebreather. Flu sent, given tamiflu. INR subtherapeutic, started on heparin drip for possible PE empirically. However, she is also in acute renal failure so I wasn't able to do Ct angio. Patient admitted to stepdown.   CRITICAL CARE Performed by: Darl Householder, Daleen Steinhaus   Total critical care time:30 min   Critical care time was exclusive of separately billable procedures and treating other patients.  Critical care was necessary to treat or prevent imminent or life-threatening deterioration.  Critical care was time spent personally by me on the following activities: development of treatment plan with patient and/or surrogate as well as nursing, discussions with consultants, evaluation of patient's response to treatment, examination of patient, obtaining history from patient or surrogate, ordering and performing treatments and interventions, ordering and review of laboratory studies, ordering and review of radiographic studies, pulse oximetry and re-evaluation of patient's condition.    Wandra Arthurs, MD 05/17/13 2256

## 2013-05-17 NOTE — Progress Notes (Signed)
Dr. Thereasa Solo notified of pts sputum gram stain results and that VQ scan cannot be done d/t fact that pt exceeds wt limit for scanner.

## 2013-05-17 NOTE — Progress Notes (Signed)
[facility] PROGRESS NOTE  WAFA EGERT [id number] DOB: Apr 30, 1971 DOA: 05/16/2013 PCP: Benito Mccreedy, MD  Assessment/Plan: #1 acute respiratory distress Likely secondary to community acquired pneumonia and Influenza A. Patient had presented with fever, cough, chills. Influenza PCR is pending. Urine strep pneumococcus is positive. Urine Legionella antigen is pending. Unable to obtain a VQ scan secondary to weight. Unable to obtain a CT angiogram of the chest secondary to acute renal failure. Patient with some improvement. Continue empiric IV Rocephin and azithromycin. Will start tamiflu. Continue oxygen. Start Mucinex. Continue schedule nebulizers. Follow.  #2 hypotension Likely secondary to dehydration. Patient's blood pressure is responding to IV fluids. Antihypertensive medications on hold. Increase IV fluids to normal saline at 125 cc per hour. Follow.  #3 systemic inflammatory response syndrome Patient on admission was noted to be hypotensive tachycardic with elevated lactic acid level. Urine strep pneumococcus is positive the likely etiology community acquired pneumonia. Blood cultures are pending. Urine cultures are pending. Lactic acid is trending down. Blood pressure is responding to IV fluids. Urine Legionella antigen is pending. Continue IV fluids. Continue empiric IV Rocephin and azithromycin. Follow.  #4 influenza A. Positive PCR. Place on Tamiflu. IV fluids. Supportive care.  #5 acute renal failure Likely secondary to prerenal azotemia as patient was noted to be hypotensive on admission in the setting of ACE inhibitor and diuretics. ACE inhibitor and diuretics on hold. Urinalysis with 30 of protein. Patient with urine output of 450 cc to date. Check a urine sodium. Check a urine creatinine. Check a renal ultrasound. Increase IV fluids 125 cc an hour. Follow renal function.  #6 hypokalemia Likely secondary to GI losses and diuretics. Magnesium level is 1.9.  Replete.  #7 HTN Bp meds on hold secondary to #2.  #8 Hx DVT/PE Continue heparin bridge. Coumadin per pharmacy.  #9 gastroesophageal reflux disease UTI. I of #9 sleep apnea Stable.  #11 prophylaxis PPI for GI prophylaxis. On heparin/Coumadin for DVT prophylaxis.  Code Status: Full Family Communication: Updated patient and sister at bedside. Disposition Plan: Remain in step down today   Consultants:  None  Procedures:  Chest x-ray 05/17/13  Antibiotics:  IV Rocephin 05/16/13  Azithromycin 05/16/13  HPI/Subjective: Patient states feeling a little better.  Objective: Filed Vitals:   05/17/13 1124  BP: 110/59  Pulse: 86  Temp:   Resp: 16    Intake/Output Summary (Last 24 hours) at 05/17/13 1139 Last data filed at 05/17/13 1100  Gross per 24 hour  Intake 2159.75 ml  Output    775 ml  Net 1384.75 ml   Filed Weights   05/16/13 1900 05/17/13 0100  Weight: 154 kg (339 lb 8.1 oz) 151.3 kg (333 lb 8.9 oz)    Exam:   General:  nad  Cardiovascular: rrr  Respiratory: coasre diffuse BS  Abdomen: Soft, nontender, nondistended, positive bowel sounds.  Musculoskeletal: No clubbing cyanosis or edema  Data Reviewed: Basic Metabolic Panel:  Recent Labs Lab 05/16/13 1800 05/17/13 0335  NA 138 139  K 3.1* 3.2*  CL 100 103  CO2 19 21  GLUCOSE 104* 111*  BUN 38* 39*  CREATININE 3.64* 2.99*  CALCIUM 8.7 8.4  MG  --  1.9  PHOS  --  3.1   Liver Function Tests: No results found for this basename: AST, ALT, ALKPHOS, BILITOT, PROT, ALBUMIN,  in the last 168 hours No results found for this basename: LIPASE, AMYLASE,  in the last 168 hours No results found for this basename: AMMONIA,  in the  last 168 hours CBC:  Recent Labs Lab 05/16/13 1800 05/17/13 0335  WBC 7.6 6.7  NEUTROABS 5.0  --   HGB 12.3 12.0  HCT 35.4* 34.4*  MCV 82.3 84.1  PLT 180 178   Cardiac Enzymes: No results found for this basename: CKTOTAL, CKMB, CKMBINDEX, TROPONINI,  in the last  168 hours BNP (last 3 results)  Recent Labs  05/17/13 0335  PROBNP 54.0   CBG: No results found for this basename: GLUCAP,  in the last 168 hours  Recent Results (from the past 240 hour(s))  MRSA PCR SCREENING     Status: None   Collection Time    05/17/13  1:00 AM      Result Value Range Status   MRSA by PCR NEGATIVE  NEGATIVE Final   Comment:            The GeneXpert MRSA Assay (FDA     approved for NASAL specimens     only), is one component of a     comprehensive MRSA colonization     surveillance program. It is not     intended to diagnose MRSA     infection nor to guide or     monitor treatment for     MRSA infections.  CULTURE, EXPECTORATED SPUTUM-ASSESSMENT     Status: None   Collection Time    05/17/13  5:20 AM      Result Value Range Status   Specimen Description SPUTUM   Final   Special Requests NONE   Final   Sputum evaluation     Final   Value: THIS SPECIMEN IS ACCEPTABLE. RESPIRATORY CULTURE REPORT TO FOLLOW.   Report Status 05/17/2013 FINAL   Final  GRAM STAIN     Status: None   Collection Time    05/17/13  5:20 AM      Result Value Range Status   Specimen Description SPUTUM   Final   Special Requests NONE   Final   Gram Stain     Final   Value: MODERATE WBC PRESENT, PREDOMINANTLY MONONUCLEAR     MODERATE GRAM POSITIVE COCCI IN PAIRS IN CHAINS     RARE GRAM NEGATIVE RODS     Gram Stain Report Called to,Read Back By and Verified With: HOPPER,M RN @ [phone] 05/17/13 LEONARD,A   Report Status 05/17/2013 FINAL   Final     Studies: Dg Chest Port 1 View  05/17/2013   CLINICAL DATA:  Shortness of breath.  EXAM: PORTABLE CHEST - 1 VIEW  COMPARISON:  05/16/2013  FINDINGS: The heart size and pulmonary vascularity are normal. The lungs are clear considering a very shallow inspiration. No effusions. No acute osseous abnormality.  IMPRESSION: No acute abnormalities.   Electronically Signed   By: Rozetta Nunnery M.D.   On: 05/17/2013 08:42   Dg Chest Portable 1  View  05/16/2013   CLINICAL DATA:  Shortness of breath.  Respiratory distress.  EXAM: PORTABLE CHEST - 1 VIEW  COMPARISON:  04/05/2012  FINDINGS: The heart size and mediastinal contours are within normal limits. Both lungs are clear. The visualized skeletal structures are unremarkable.  IMPRESSION: No active disease.   Electronically Signed   By: Earle Gell M.D.   On: 05/16/2013 17:41    Scheduled Meds: . albuterol  2.5 mg Nebulization QID  . azithromycin  500 mg Oral Q24H  . cefTRIAXone (ROCEPHIN)  IV  1 g Intravenous Q24H  . docusate sodium  100 mg Oral Q12H  . guaiFENesin  1,200 mg Oral BID  . hydrocortisone   Rectal BID  . pantoprazole  40 mg Oral Q0600  . potassium chloride  40 mEq Oral Q4H  . warfarin  10 mg Oral ONCE-1800  . Warfarin - Pharmacist Dosing Inpatient   Does not apply q1800   Continuous Infusions: . sodium chloride 125 mL/hr at 05/17/13 1125  . heparin 1,700 Units/hr (05/17/13 1100)    Principal Problem:   Acute respiratory failure Active Problems:   CAP (community acquired pneumonia): Probable   SIRS (systemic inflammatory response syndrome)   Hypotension, unspecified   ARF (acute renal failure)   Hypokalemia   OBESITY, MORBID   ANEMIA, IRON DEFICIENCY NEC   HYPERTENSION   PULMONARY EMBOLISM   DVT   GASTROESOPHAGEAL REFLUX DISEASE   Influenza A    Time spent: Camilla MD [facility] Pager [phone]. If 7PM-7AM, please contact night-coverage at [email or web address], password [facility] 05/17/2013, 11:39 AM  LOS: 1 day

## 2013-05-17 NOTE — Progress Notes (Signed)
PHARMACIST - PHYSICIAN ORDER COMMUNICATION  CONCERNING: P&T Medication Policy on Herbal/Diet Medications  DESCRIPTION:  This patient's order for: Adipex-P (Phertermine)  has been noted.  This product(s) is classified as a diet/appetite suppression product. Due to the potential risk of unknown drug-drug interactions while on inpatient medications, the Pharmacy and Therapeutics Committee does not permit the use of diet/appetite suppression products of this type within [facility].   ACTION TAKEN: The pharmacy department is unable to verify this order at this time and your patient has been informed of this safety policy. Please reevaluate patient's clinical condition at discharge and address if the herbal or natural product(s) should be resumed at that time.  Lional Icenogle S. Alford Highland, PharmD, [place] Clinical Staff Pharmacist Pager [phone]

## 2013-05-17 NOTE — Progress Notes (Signed)
ANTICOAGULATION CONSULT NOTE - Follow Up Consult  Pharmacy Consult for heparin Indication: hx DVT/PE and r/o new pulmonary embolus   Allergies  Allergen Reactions  . Oxycodone Hcl Nausea Only    Patient Measurements: Height: 5\' 8"  (172.7 cm) Weight: 333 lb 8.9 oz (151.3 kg) IBW/kg (Calculated) : 63.9 Heparin Dosing Weight: 104 kg  Vital Signs: Temp: 98.5 F (36.9 C) (01/02 1211) Temp src: Oral (01/02 1211) BP: 110/59 mmHg (01/02 1124) Pulse Rate: 86 (01/02 1124)  Labs:  Recent Labs  05/16/13 1800 05/17/13 0335 05/17/13 1338  HGB 12.3 12.0  --   HCT 35.4* 34.4*  --   PLT 180 178  --   APTT 29  --   --   LABPROT 13.9  --   --   INR 1.09  --   --   HEPARINUNFRC  --  0.67 0.90*  CREATININE 3.64* 2.99*  --     Estimated Creatinine Clearance: 38.7 ml/min (by C-G formula based on Cr of 2.99).   Assessment: Patient is a 43 y.o F on coumadin PTA for DVT/PE and r/o new PE. Not able to perform VQ scan today d/t weight. INR is subtherapeutic and is on heparin for bridging.  Heparin is supratherapeutic at 0.90 (level drawn from correct arm).  No bleeding documented.   Goal of Therapy:  INR 2-3 Heparin level 0.3-0.7 units/ml Monitor platelets by anticoagulation protocol: Yes   Plan:  - decrease drip to 1500 units/hr - check 8 hour heparin level - monitor for s/s of bleeding -coumadin 10 mg x 1 tonight    Jane Birkel P 05/17/2013,2:40 PM

## 2013-05-17 NOTE — Progress Notes (Signed)
Utilization Review Completed.Alice [name] T1/06/2013  

## 2013-05-18 DIAGNOSIS — D509 Iron deficiency anemia, unspecified: Secondary | ICD-10-CM | POA: Diagnosis not present

## 2013-05-18 DIAGNOSIS — R6510 Systemic inflammatory response syndrome (SIRS) of non-infectious origin without acute organ dysfunction: Secondary | ICD-10-CM

## 2013-05-18 DIAGNOSIS — I959 Hypotension, unspecified: Secondary | ICD-10-CM

## 2013-05-18 LAB — HEPARIN LEVEL (UNFRACTIONATED)
HEPARIN UNFRACTIONATED: 0.93 [IU]/mL — AB (ref 0.30–0.70)
Heparin Unfractionated: 0.59 IU/mL (ref 0.30–0.70)

## 2013-05-18 LAB — BASIC METABOLIC PANEL
BUN: 27 mg/dL — AB (ref 6–23)
CALCIUM: 8.2 mg/dL — AB (ref 8.4–10.5)
CO2: 20 mEq/L (ref 19–32)
Chloride: 109 mEq/L (ref 96–112)
Creatinine, Ser: 1.46 mg/dL — ABNORMAL HIGH (ref 0.50–1.10)
GFR calc non Af Amer: 44 mL/min — ABNORMAL LOW (ref >90)
GFR, EST AFRICAN AMERICAN: 51 mL/min — AB (ref >90)
Glucose, Bld: 92 mg/dL (ref 70–99)
Potassium: 4.3 mEq/L (ref 3.7–5.3)
Sodium: 141 mEq/L (ref 137–147)

## 2013-05-18 LAB — CBC
HCT: 30.7 % — ABNORMAL LOW (ref 36.0–46.0)
Hemoglobin: 10.6 g/dL — ABNORMAL LOW (ref 12.0–15.0)
MCH: 29.2 pg (ref 26.0–34.0)
MCHC: 34.5 g/dL (ref 30.0–36.0)
MCV: 84.6 fL (ref 78.0–100.0)
PLATELETS: 176 10*3/uL (ref 150–400)
RBC: 3.63 MIL/uL — ABNORMAL LOW (ref 3.87–5.11)
RDW: 15.7 % — ABNORMAL HIGH (ref 11.5–15.5)
WBC: 4.1 10*3/uL (ref 4.0–10.5)

## 2013-05-18 LAB — URINE CULTURE: Colony Count: 15000

## 2013-05-18 LAB — LEGIONELLA ANTIGEN, URINE: Legionella Antigen, Urine: NEGATIVE

## 2013-05-18 LAB — IRON AND TIBC
IRON: 69 ug/dL (ref 42–135)
Saturation Ratios: 32 % (ref 20–55)
TIBC: 214 ug/dL — AB (ref 250–470)
UIBC: 145 ug/dL (ref 125–400)

## 2013-05-18 LAB — PROTIME-INR
INR: 0.99 (ref 0.00–1.49)
PROTHROMBIN TIME: 12.9 s (ref 11.6–15.2)

## 2013-05-18 MED ORDER — OSELTAMIVIR PHOSPHATE 75 MG PO CAPS
75.0000 mg | ORAL_CAPSULE | Freq: Two times a day (BID) | ORAL | Status: DC
  Administered 2013-05-18 – 2013-05-20 (×4): 75 mg via ORAL
  Filled 2013-05-18 (×5): qty 1

## 2013-05-18 MED ORDER — WARFARIN SODIUM 2.5 MG PO TABS
12.5000 mg | ORAL_TABLET | Freq: Once | ORAL | Status: AC
  Administered 2013-05-18: 12.5 mg via ORAL
  Filled 2013-05-18: qty 1

## 2013-05-18 MED ORDER — WARFARIN SODIUM 10 MG PO TABS
10.0000 mg | ORAL_TABLET | Freq: Once | ORAL | Status: DC
Start: 2013-05-18 — End: 2013-05-18
  Filled 2013-05-18: qty 1

## 2013-05-18 NOTE — Progress Notes (Addendum)
ANTICOAGULATION CONSULT NOTE - Follow Up Consult  Pharmacy Consult for heparin and coumadin Indication: hx DVT/PE and r/o new pulmonary embolus   Allergies  Allergen Reactions  . Oxycodone Hcl Nausea Only    Patient Measurements: Height: 5\' 8"  (172.7 cm) Weight: 333 lb 8.9 oz (151.3 kg) IBW/kg (Calculated) : 63.9 Heparin Dosing Weight: 104 kg  Vital Signs: Temp: 98.1 F (36.7 C) (01/03 0735) Temp src: Oral (01/03 0735) BP: 109/65 mmHg (01/03 0800) Pulse Rate: 78 (01/03 0800)  Labs:  Recent Labs  05/16/13 1800  05/17/13 0335 05/17/13 1338 05/17/13 2320 05/18/13 0803  HGB 12.3  --  12.0  --   --  10.6*  HCT 35.4*  --  34.4*  --   --  30.7*  PLT 180  --  178  --   --  176  APTT 29  --   --   --   --   --   LABPROT 13.9  --   --   --   --   --   INR 1.09  --   --   --   --   --   HEPARINUNFRC  --   < > 0.67 0.90* 0.93* 0.59  CREATININE 3.64*  --  2.99*  --   --  1.46*  < > = values in this interval not displayed.  Estimated Creatinine Clearance: 79.2 ml/min (by C-G formula based on Cr of 1.46).   Assessment: Patient is a 43 y.o F on coumadin PTA for DVT/PE. Not able to perform VQ scan today d/t weight. INR is pending and continues on heparin for bridging.  Heparin is now therapeutic at 0.59 after rate change overnight. No bleeding has been noted. Hgb is trending down 12>>10.6 and plt count is stable.  Goal of Therapy:  INR 2-3 Heparin level 0.3-0.7 units/ml Monitor platelets by anticoagulation protocol: Yes   Plan:  - Continue drip at 1200 units/hr - recheck 8 hour heparin level to confirm - monitor for s/s of bleeding -coumadin 10 mg x 1 tonight  Erin Hearing PharmD., BCPS Clinical Pharmacist Pager [phone] 05/18/2013 10:46 AM

## 2013-05-18 NOTE — Progress Notes (Signed)
Pt refused to wear CPAP tonight. She does wear one at home but does not feel like wearing one her. RT explain importance of  CPAP. Pt understands and continues to refuse.

## 2013-05-18 NOTE — Progress Notes (Signed)
Pt refused CPAP states that she does not want to wear it while here in the hospital.  RT made her aware that if she changed her mind to let RN know and RT would come back and set patient up with a machine.

## 2013-05-18 NOTE — Progress Notes (Signed)
Transferred to [place] [facility] via wheelchair. Portable monitor on. No changes.

## 2013-05-18 NOTE — Progress Notes (Signed)
ANTICOAGULATION CONSULT NOTE - Follow Up Consult  Pharmacy Consult for heparin Indication: h/o DVT/PE w/ possible new PE  Labs:  Recent Labs  05/16/13 1800 05/17/13 0335 05/17/13 1338 05/17/13 2320  HGB 12.3 12.0  --   --   HCT 35.4* 34.4*  --   --   PLT 180 178  --   --   APTT 29  --   --   --   LABPROT 13.9  --   --   --   INR 1.09  --   --   --   HEPARINUNFRC  --  0.67 0.90* 0.93*  CREATININE 3.64* 2.99*  --   --     Assessment: 43yo female now with higher heparin level despite decrease in rate.  Goal of Therapy:  Heparin level 0.3-0.7 units/ml   Plan:  Will decrease heparin gtt by 2 units/kg/hr (3 units/kgABW/hr) to 1200 units/hr and check level in [place], PharmD, BCPS  05/18/2013,12:12 AM

## 2013-05-18 NOTE — Progress Notes (Addendum)
[facility] PROGRESS NOTE  Alice [name] [id number] DOB: 23-Jun-1970 DOA: 05/16/2013 PCP: Benito Mccreedy, MD  Assessment/Plan: #1 acute respiratory distress Likely secondary to community acquired pneumonia and Influenza A. Patient had presented with fever, cough, chills. Influenza PCR is positive for Influenza A. Urine strep pneumococcus is positive. Urine Legionella antigen is pending. Unable to obtain a VQ scan secondary to weight. Unable to obtain a CT angiogram of the chest secondary to acute renal failure. Patient with clinical improvement. Continue empiric IV Rocephin and azithromycin, tamiflu, mucinex, oxygen. Continue schedule nebulizers. Follow.  #2 hypotension Likely secondary to dehydration. Patient's blood pressure is responding to IV fluids. Antihypertensive medications on hold. Continue IVF. Follow.  #3 systemic inflammatory response syndrome Patient on admission was noted to be hypotensive tachycardic with elevated lactic acid level. Urine strep pneumococcus is positive the likely etiology community acquired pneumonia. Blood cultures are pending. Urine cultures neg. Lactic acid is trending down. Blood pressure is responding to IV fluids. Urine Legionella antigen is pending. Continue IV fluids. Continue empiric IV Rocephin and azithromycin. Follow.  #4 CAP Patient presented with resp sxs, fever, cough, urine strep pneum antigen positive. Continue IV Rocephin and azithromycin.  #5 influenza A. Positive PCR. Continue Tamiflu. IV fluids. Supportive care.  #6 acute renal failure Likely secondary to prerenal azotemia as patient was noted to be hypotensive on admission in the setting of ACE inhibitor and diuretics. ACE inhibitor and diuretics on hold. Renal function improving. Urinalysis with 30 of protein. Patient with urine output of 2750 cc over past 24hrs. Renal ultrasound neg. Continue IVF. Follow renal function.  #7 hypokalemia Likely secondary to GI losses and  diuretics. Magnesium level is 1.9. Repleted.  #8 HTN Bp meds on hold secondary to #2.  #9 Hx DVT/PE Continue heparin bridge. Check PT/INR. Coumadin per pharmacy.  #10 gastroesophageal reflux disease PPI  #11 sleep apnea Stable. CPAP QHS  #12 Anemia Likely partly dilutional and likely iron deficiency anemia in menstruating female. Check anemia panel. Follow.  #13 prophylaxis PPI for GI prophylaxis. On heparin/Coumadin for DVT prophylaxis.  Code Status: Full Family Communication: Updated patient at bedside. Disposition Plan: transfer to telemetry   Consultants:  None  Procedures:  Chest x-ray 05/17/13  Antibiotics:  IV Rocephin 05/16/13  Azithromycin 05/16/13  HPI/Subjective: Patient states feeling a little better. C/o chest discomfort with coughing.   Objective: Filed Vitals:   05/18/13 0800  BP: 109/65  Pulse: 78  Temp:   Resp: 25    Intake/Output Summary (Last 24 hours) at 05/18/13 0901 Last data filed at 05/18/13 0800  Gross per 24 hour  Intake   3214 ml  Output   2400 ml  Net    814 ml   Filed Weights   05/16/13 1900 05/17/13 0100  Weight: 154 kg (339 lb 8.1 oz) 151.3 kg (333 lb 8.9 oz)    Exam:   General:  nad  Cardiovascular: rrr  Respiratory: coarse diffuse BS improving.  Abdomen: Soft, nontender, nondistended, positive bowel sounds.  Musculoskeletal: No clubbing cyanosis or edema  Data Reviewed: Basic Metabolic Panel:  Recent Labs Lab 05/16/13 1800 05/17/13 0335 05/18/13 0803  NA 138 139 141  K 3.1* 3.2* 4.3  CL 100 103 109  CO2 19 21 20   GLUCOSE 104* 111* 92  BUN 38* 39* 27*  CREATININE 3.64* 2.99* 1.46*  CALCIUM 8.7 8.4 8.2*  MG  --  1.9  --   PHOS  --  3.1  --    Liver Function Tests:  No results found for this basename: AST, ALT, ALKPHOS, BILITOT, PROT, ALBUMIN,  in the last 168 hours No results found for this basename: LIPASE, AMYLASE,  in the last 168 hours No results found for this basename: AMMONIA,  in the last  168 hours CBC:  Recent Labs Lab 05/16/13 1800 05/17/13 0335 05/18/13 0803  WBC 7.6 6.7 4.1  NEUTROABS 5.0  --   --   HGB 12.3 12.0 10.6*  HCT 35.4* 34.4* 30.7*  MCV 82.3 84.1 84.6  PLT 180 178 176   Cardiac Enzymes: No results found for this basename: CKTOTAL, CKMB, CKMBINDEX, TROPONINI,  in the last 168 hours BNP (last 3 results)  Recent Labs  05/17/13 0335  PROBNP 54.0   CBG: No results found for this basename: GLUCAP,  in the last 168 hours  Recent Results (from the past 240 hour(s))  MRSA PCR SCREENING     Status: None   Collection Time    05/17/13  1:00 AM      Result Value Range Status   MRSA by PCR NEGATIVE  NEGATIVE Final   Comment:            The GeneXpert MRSA Assay (FDA     approved for NASAL specimens     only), is one component of a     comprehensive MRSA colonization     surveillance program. It is not     intended to diagnose MRSA     infection nor to guide or     monitor treatment for     MRSA infections.  URINE CULTURE     Status: None   Collection Time    05/17/13  1:41 AM      Result Value Range Status   Specimen Description URINE, CLEAN CATCH   Final   Special Requests NONE   Final   Culture  Setup Time     Final   Value: 05/17/2013 06:22     Performed at [facility]     Final   Value: 15,000 COLONIES/ML     Performed at [facility]   Culture     Final   Value: Multiple bacterial morphotypes present, none predominant. Suggest appropriate recollection if clinically indicated.     Performed at [facility]   Report Status 05/18/2013 FINAL   Final  CULTURE, EXPECTORATED SPUTUM-ASSESSMENT     Status: None   Collection Time    05/17/13  5:20 AM      Result Value Range Status   Specimen Description SPUTUM   Final   Special Requests NONE   Final   Sputum evaluation     Final   Value: THIS SPECIMEN IS ACCEPTABLE. RESPIRATORY CULTURE REPORT TO FOLLOW.   Report Status 05/17/2013 FINAL   Final  GRAM  STAIN     Status: None   Collection Time    05/17/13  5:20 AM      Result Value Range Status   Specimen Description SPUTUM   Final   Special Requests NONE   Final   Gram Stain     Final   Value: MODERATE WBC PRESENT, PREDOMINANTLY MONONUCLEAR     MODERATE GRAM POSITIVE COCCI IN PAIRS IN CHAINS     RARE GRAM NEGATIVE RODS     Gram Stain Report Called to,Read Back By and Verified With: HOPPER,M RN @ [phone] 05/17/13 LEONARD,A   Report Status 05/17/2013 FINAL   Final  CULTURE, RESPIRATORY (NON-EXPECTORATED)  Status: None   Collection Time    05/17/13  5:20 AM      Result Value Range Status   Specimen Description SPUTUM   Final   Special Requests NONE   Final   Gram Stain     Final   Value: MODERATE WBC PRESENT, PREDOMINANTLY MONONUCLEAR     MODERATE SQUAMOUS EPITHELIAL CELLS PRESENT     MODERATE GRAM POSITIVE COCCI     IN PAIRS IN CHAINS RARE GRAM NEGATIVE RODS     Performed at [facility]   Culture PENDING   Incomplete   Report Status PENDING   Incomplete     Studies: US Renal  05/17/2013   CLINICAL DATA:  Acute renal failure  EXAM: RENAL/URINARY TRACT ULTRASOUND COMPLETE  COMPARISON:  None.  FINDINGS: Right Kidney:  Length: 12.3 cm. Echogenicity within normal limits. No mass or hydronephrosis visualized.  Left Kidney:  Length: 11.6 cm. Echogenicity within normal limits. No mass or hydronephrosis visualized.  Bladder:  Appears normal for degree of bladder distention.  IMPRESSION: Normal renal ultrasound.   Electronically Signed   By: Kathreen Devoid   On: 05/17/2013 15:04   Dg Chest Port 1 View  05/17/2013   CLINICAL DATA:  Shortness of breath.  EXAM: PORTABLE CHEST - 1 VIEW  COMPARISON:  05/16/2013  FINDINGS: The heart size and pulmonary vascularity are normal. The lungs are clear considering a very shallow inspiration. No effusions. No acute osseous abnormality.  IMPRESSION: No acute abnormalities.   Electronically Signed   By: Rozetta Nunnery M.D.   On: 05/17/2013 08:42   Dg Chest  Portable 1 View  05/16/2013   CLINICAL DATA:  Shortness of breath.  Respiratory distress.  EXAM: PORTABLE CHEST - 1 VIEW  COMPARISON:  04/05/2012  FINDINGS: The heart size and mediastinal contours are within normal limits. Both lungs are clear. The visualized skeletal structures are unremarkable.  IMPRESSION: No active disease.   Electronically Signed   By: Earle Gell M.D.   On: 05/16/2013 17:41    Scheduled Meds: . albuterol  2.5 mg Nebulization QID  . azithromycin  500 mg Oral Q24H  . cefTRIAXone (ROCEPHIN)  IV  1 g Intravenous Q24H  . docusate sodium  100 mg Oral Q12H  . guaiFENesin  1,200 mg Oral BID  . hydrocortisone   Rectal BID  . oseltamivir  30 mg Oral BID  . pantoprazole  40 mg Oral Q0600  . Warfarin - Pharmacist Dosing Inpatient   Does not apply q1800   Continuous Infusions: . sodium chloride 125 mL/hr at 05/18/13 0800  . heparin 1,200 Units/hr (05/18/13 0800)    Principal Problem:   Acute respiratory failure Active Problems:   CAP (community acquired pneumonia): Probable   SIRS (systemic inflammatory response syndrome)   Hypotension, unspecified   ARF (acute renal failure)   Hypokalemia   OBESITY, MORBID   ANEMIA, IRON DEFICIENCY NEC   HYPERTENSION   PULMONARY EMBOLISM   DVT   GASTROESOPHAGEAL REFLUX DISEASE   Influenza A   Anemia    Time spent: [place] MD [facility] Pager [phone]. If 7PM-7AM, please contact night-coverage at [email or web address], password [facility] 05/18/2013, 9:01 AM  LOS: 2 days

## 2013-05-19 LAB — CULTURE, RESPIRATORY W GRAM STAIN: Culture: NORMAL

## 2013-05-19 LAB — PROTIME-INR
INR: 1.16 (ref 0.00–1.49)
Prothrombin Time: 14.6 seconds (ref 11.6–15.2)

## 2013-05-19 LAB — BASIC METABOLIC PANEL
BUN: 19 mg/dL (ref 6–23)
CALCIUM: 8.5 mg/dL (ref 8.4–10.5)
CO2: 18 meq/L — AB (ref 19–32)
CREATININE: 1.18 mg/dL — AB (ref 0.50–1.10)
Chloride: 109 mEq/L (ref 96–112)
GFR calc Af Amer: 65 mL/min — ABNORMAL LOW (ref >90)
GFR, EST NON AFRICAN AMERICAN: 56 mL/min — AB (ref >90)
GLUCOSE: 81 mg/dL (ref 70–99)
Potassium: 4 mEq/L (ref 3.7–5.3)
SODIUM: 139 meq/L (ref 137–147)

## 2013-05-19 LAB — CULTURE, RESPIRATORY

## 2013-05-19 LAB — CBC
HCT: 30.9 % — ABNORMAL LOW (ref 36.0–46.0)
Hemoglobin: 10.6 g/dL — ABNORMAL LOW (ref 12.0–15.0)
MCH: 29.2 pg (ref 26.0–34.0)
MCHC: 34.3 g/dL (ref 30.0–36.0)
MCV: 85.1 fL (ref 78.0–100.0)
Platelets: 198 10*3/uL (ref 150–400)
RBC: 3.63 MIL/uL — AB (ref 3.87–5.11)
RDW: 15.7 % — AB (ref 11.5–15.5)
WBC: 5 10*3/uL (ref 4.0–10.5)

## 2013-05-19 LAB — HEPARIN LEVEL (UNFRACTIONATED): HEPARIN UNFRACTIONATED: 0.55 [IU]/mL (ref 0.30–0.70)

## 2013-05-19 LAB — FERRITIN: FERRITIN: 223 ng/mL (ref 10–291)

## 2013-05-19 LAB — VITAMIN B12: Vitamin B-12: 262 pg/mL (ref 211–911)

## 2013-05-19 LAB — FOLATE: Folate: 16.1 ng/mL

## 2013-05-19 MED ORDER — LEVOFLOXACIN 750 MG PO TABS
750.0000 mg | ORAL_TABLET | ORAL | Status: DC
  Administered 2013-05-19: 750 mg via ORAL
  Filled 2013-05-19 (×2): qty 1

## 2013-05-19 MED ORDER — WARFARIN SODIUM 2.5 MG PO TABS
12.5000 mg | ORAL_TABLET | Freq: Once | ORAL | Status: AC
  Administered 2013-05-19: 12.5 mg via ORAL
  Filled 2013-05-19: qty 1

## 2013-05-19 MED ORDER — ENOXAPARIN (LOVENOX) PATIENT EDUCATION KIT
PACK | Freq: Once | Status: DC
  Filled 2013-05-19: qty 1

## 2013-05-19 MED ORDER — AMLODIPINE BESYLATE 10 MG PO TABS
10.0000 mg | ORAL_TABLET | Freq: Every day | ORAL | Status: DC
  Administered 2013-05-19 – 2013-05-20 (×2): 10 mg via ORAL
  Filled 2013-05-19 (×2): qty 1

## 2013-05-19 MED ORDER — ENOXAPARIN SODIUM 150 MG/ML ~~LOC~~ SOLN
150.0000 mg | Freq: Two times a day (BID) | SUBCUTANEOUS | Status: DC
  Administered 2013-05-19 – 2013-05-20 (×3): 150 mg via SUBCUTANEOUS
  Filled 2013-05-19 (×4): qty 1

## 2013-05-19 NOTE — Progress Notes (Signed)
ANTICOAGULATION CONSULT NOTE - Follow Up Consult  Pharmacy Consult for Lovenox and Coumadin Indication: hx DVT/PE and r/o new pulmonary embolus   Allergies  Allergen Reactions  . Oxycodone Hcl Nausea Only    Patient Measurements: Height: 5\' 8"  (172.7 cm) Weight: 333 lb 8.9 oz (151.3 kg) IBW/kg (Calculated) : 63.9 Heparin Dosing Weight: 104 kg  Vital Signs: Temp: 98.3 F (36.8 C) (01/04 0807) Temp src: Oral (01/04 0807) BP: 147/89 mmHg (01/04 0807) Pulse Rate: 70 (01/04 0807)  Labs:  Recent Labs  05/16/13 1800 05/17/13 0335  05/17/13 2320 05/18/13 0803 05/18/13 1035 05/19/13 0715  HGB 12.3 12.0  --   --  10.6*  --  10.6*  HCT 35.4* 34.4*  --   --  30.7*  --  30.9*  PLT 180 178  --   --  176  --  198  APTT 29  --   --   --   --   --   --   LABPROT 13.9  --   --   --   --  12.9 14.6  INR 1.09  --   --   --   --  0.99 1.16  HEPARINUNFRC  --  0.67  < > 0.93* 0.59  --  0.55  CREATININE 3.64* 2.99*  --   --  1.46*  --  1.18*  < > = values in this interval not displayed.  Estimated Creatinine Clearance: 98 ml/min (by C-G formula based on Cr of 1.18).   Assessment: Patient is a 43 y.o F on coumadin PTA for DVT/PE. Not able to perform VQ scan d/t weight. Initially started on heparin bridge + warfarin, now to transition to treatment dose lovenox bridge. HL is currently therapeutic. INR remains SUBtherapeutic at 1.16. Hgb is trending down 12>>10.6 and plt count is stable. No bleeding has been noted.  Home Coumadin dose: 10 mg PO daily  Goal of Therapy:  INR 2-3 Anti-Xa level 0.6-1 units/ml 4hrs after LMWH dose given if needed Monitor platelets by anticoagulation protocol: Yes   Plan:  - D/c heparin drip - Start lovenox 150 mg SQ q12h (to start 1 hr after hep stopped) - Coumadin 12.5 mg PO x 1 tonight - Monitor daily PT/INR, CBC, s/s bleeding  Harolyn Rutherford, PharmD Clinical Pharmacist - Resident Pager: [phone] Pharmacy: [phone] 05/19/2013 9:28  AM

## 2013-05-19 NOTE — Progress Notes (Signed)
[facility] PROGRESS NOTE  Alice GILLOOLY [id number] DOB: 1970/09/28 DOA: 05/16/2013 PCP: Benito Mccreedy, MD  Assessment/Plan: #1 acute respiratory distress Likely secondary to community acquired pneumonia and Influenza A. Patient had presented with fever, cough, chills. Influenza PCR is positive for Influenza A. Urine strep pneumococcus is positive. Urine Legionella antigen is pending. Unable to obtain a VQ scan secondary to weight. Unable to obtain a CT angiogram of the chest secondary to acute renal failure. Patient with clinical improvement. Change empiric IV Rocephin and azithromycin to oral levaquin. Continue tamiflu, mucinex, oxygen. Continue schedule nebulizers. Follow.  #2 hypotension Likely secondary to dehydration. Patient's blood pressure is responding to IV fluids. Antihypertensive medications on hold. NSL IVF. Follow.  #3 systemic inflammatory response syndrome Patient on admission was noted to be hypotensive tachycardic with elevated lactic acid level. Urine strep pneumococcus is positive the likely etiology community acquired pneumonia. Blood cultures are pending. Urine cultures neg. Lactic acid is trending down. Blood pressure is responding to IV fluids. Urine Legionella antigen is pending. NSL IV fluids. Change IV Rocephin and azithromycin to oral levaquin. Follow.  #4 CAP Patient presented with resp sxs, fever, cough, urine strep pneum antigen positive. Change IV Rocephin and azithromycin to oral levaquin.  #5 influenza A. Positive PCR. Continue Tamiflu. IV fluids. Supportive care.  #6 acute renal failure Likely secondary to prerenal azotemia as patient was noted to be hypotensive on admission in the setting of ACE inhibitor and diuretics. ACE inhibitor and diuretics on hold. Renal function improving. Urinalysis with 30 of protein. Patient with urine output of 1000 cc over past 24hrs. Renal ultrasound neg. NSL IVF. Follow renal function.  #7 hypokalemia Likely  secondary to GI losses and diuretics. Magnesium level is 1.9. Repleted.  #8 HTN Bp meds on hold secondary to #2. Normal saline lock IV fluids. Will place on Norvasc 10 mg daily for blood pressure control.  #9 Hx DVT/PE Change heparin to full dose Lovenox. Coumadin per pharmacy.  #10 gastroesophageal reflux disease PPI  #11 sleep apnea Stable. CPAP QHS  #12 Anemia Likely partly dilutional and likely iron deficiency anemia in menstruating female. Anemia panel consistent with anemia of chronic disease. Patient is stable. Normal saline lock IV fluids. Follow.  #13 prophylaxis PPI for GI prophylaxis. On Lovenox/Coumadin for DVT prophylaxis.  Code Status: Full Family Communication: Updated patient at bedside. Disposition Plan: home when medically stable.   Consultants:  None  Procedures:  Chest x-ray 05/17/13  Antibiotics:  IV Rocephin 05/16/13--->05/19/13  Azithromycin 05/16/13---> 05/19/13  Oral levaquin 05/19/13  HPI/Subjective: Patient states feeling a little better.   Objective: Filed Vitals:   05/19/13 1310  BP: 148/77  Pulse: 71  Temp: 98.8 F (37.1 C)  Resp: 20    Intake/Output Summary (Last 24 hours) at 05/19/13 1514 Last data filed at 05/19/13 0840  Gross per 24 hour  Intake   3270 ml  Output    600 ml  Net   2670 ml   Filed Weights   05/16/13 1900 05/17/13 0100 05/18/13 2027  Weight: 154 kg (339 lb 8.1 oz) 151.3 kg (333 lb 8.9 oz) 151.3 kg (333 lb 8.9 oz)    Exam:   General:  nad  Cardiovascular: rrr  Respiratory: coarse diffuse BS improving.  Abdomen: Soft, nontender, nondistended, positive bowel sounds.  Musculoskeletal: No clubbing cyanosis or edema  Data Reviewed: Basic Metabolic Panel:  Recent Labs Lab 05/16/13 1800 05/17/13 0335 05/18/13 0803 05/19/13 0715  NA 138 139 141 139  K 3.1* 3.2*  4.3 4.0  CL 100 103 109 109  CO2 19 21 20  18*  GLUCOSE 104* 111* 92 81  BUN 38* 39* 27* 19  CREATININE 3.64* 2.99* 1.46* 1.18*  CALCIUM  8.7 8.4 8.2* 8.5  MG  --  1.9  --   --   PHOS  --  3.1  --   --    Liver Function Tests: No results found for this basename: AST, ALT, ALKPHOS, BILITOT, PROT, ALBUMIN,  in the last 168 hours No results found for this basename: LIPASE, AMYLASE,  in the last 168 hours No results found for this basename: AMMONIA,  in the last 168 hours CBC:  Recent Labs Lab 05/16/13 1800 05/17/13 0335 05/18/13 0803 05/19/13 0715  WBC 7.6 6.7 4.1 5.0  NEUTROABS 5.0  --   --   --   HGB 12.3 12.0 10.6* 10.6*  HCT 35.4* 34.4* 30.7* 30.9*  MCV 82.3 84.1 84.6 85.1  PLT 180 178 176 198   Cardiac Enzymes: No results found for this basename: CKTOTAL, CKMB, CKMBINDEX, TROPONINI,  in the last 168 hours BNP (last 3 results)  Recent Labs  05/17/13 0335  PROBNP 54.0   CBG: No results found for this basename: GLUCAP,  in the last 168 hours  Recent Results (from the past 240 hour(s))  CULTURE, BLOOD (ROUTINE X 2)     Status: None   Collection Time    05/16/13  5:27 PM      Result Value Range Status   Specimen Description BLOOD RIGHT ANTECUBITAL   Final   Special Requests BOTTLES DRAWN AEROBIC AND ANAEROBIC 5CC   Final   Culture  Setup Time     Final   Value: 05/17/2013 00:29     Performed at [facility]   Culture     Final   Value:        BLOOD CULTURE RECEIVED NO GROWTH TO DATE CULTURE WILL BE HELD FOR 5 DAYS BEFORE ISSUING A FINAL NEGATIVE REPORT     Performed at [facility]   Report Status PENDING   Incomplete  CULTURE, BLOOD (ROUTINE X 2)     Status: None   Collection Time    05/16/13  9:03 PM      Result Value Range Status   Specimen Description BLOOD ARM RIGHT   Final   Special Requests BOTTLES DRAWN AEROBIC AND ANAEROBIC 5CC   Final   Culture  Setup Time     Final   Value: 05/17/2013 03:02     Performed at [facility]   Culture     Final   Value:        BLOOD CULTURE RECEIVED NO GROWTH TO DATE CULTURE WILL BE HELD FOR 5 DAYS BEFORE ISSUING A FINAL NEGATIVE  REPORT     Performed at [facility]   Report Status PENDING   Incomplete  MRSA PCR SCREENING     Status: None   Collection Time    05/17/13  1:00 AM      Result Value Range Status   MRSA by PCR NEGATIVE  NEGATIVE Final   Comment:            The GeneXpert MRSA Assay (FDA     approved for NASAL specimens     only), is one component of a     comprehensive MRSA colonization     surveillance program. It is not     intended to diagnose MRSA  infection nor to guide or     monitor treatment for     MRSA infections.  URINE CULTURE     Status: None   Collection Time    05/17/13  1:41 AM      Result Value Range Status   Specimen Description URINE, CLEAN CATCH   Final   Special Requests NONE   Final   Culture  Setup Time     Final   Value: 05/17/2013 06:22     Performed at [facility]     Final   Value: 15,000 COLONIES/ML     Performed at [facility]   Culture     Final   Value: Multiple bacterial morphotypes present, none predominant. Suggest appropriate recollection if clinically indicated.     Performed at [facility]   Report Status 05/18/2013 FINAL   Final  CULTURE, EXPECTORATED SPUTUM-ASSESSMENT     Status: None   Collection Time    05/17/13  5:20 AM      Result Value Range Status   Specimen Description SPUTUM   Final   Special Requests NONE   Final   Sputum evaluation     Final   Value: THIS SPECIMEN IS ACCEPTABLE. RESPIRATORY CULTURE REPORT TO FOLLOW.   Report Status 05/17/2013 FINAL   Final  GRAM STAIN     Status: None   Collection Time    05/17/13  5:20 AM      Result Value Range Status   Specimen Description SPUTUM   Final   Special Requests NONE   Final   Gram Stain     Final   Value: MODERATE WBC PRESENT, PREDOMINANTLY MONONUCLEAR     MODERATE GRAM POSITIVE COCCI IN PAIRS IN CHAINS     RARE GRAM NEGATIVE RODS     Gram Stain Report Called to,Read Back By and Verified With: HOPPER,M RN @ [phone] 05/17/13 LEONARD,A    Report Status 05/17/2013 FINAL   Final  CULTURE, RESPIRATORY (NON-EXPECTORATED)     Status: None   Collection Time    05/17/13  5:20 AM      Result Value Range Status   Specimen Description SPUTUM   Final   Special Requests NONE   Final   Gram Stain     Final   Value: MODERATE WBC PRESENT, PREDOMINANTLY MONONUCLEAR     MODERATE SQUAMOUS EPITHELIAL CELLS PRESENT     MODERATE GRAM POSITIVE COCCI     IN PAIRS IN CHAINS RARE GRAM NEGATIVE RODS     Performed at [facility]   Culture     Final   Value: NORMAL OROPHARYNGEAL FLORA     Performed at [facility]   Report Status 05/19/2013 FINAL   Final     Studies: No results found.  Scheduled Meds: . azithromycin  500 mg Oral Q24H  . cefTRIAXone (ROCEPHIN)  IV  1 g Intravenous Q24H  . docusate sodium  100 mg Oral Q12H  . enoxaparin (LOVENOX) injection  150 mg Subcutaneous Q12H  . enoxaparin   Does not apply Once  . guaiFENesin  1,200 mg Oral BID  . hydrocortisone   Rectal BID  . oseltamivir  75 mg Oral BID  . pantoprazole  40 mg Oral Q0600  . warfarin  12.5 mg Oral ONCE-1800  . Warfarin - Pharmacist Dosing Inpatient   Does not apply q1800   Continuous Infusions:    Principal Problem:   Acute respiratory failure Active Problems:  CAP (community acquired pneumonia): Probable   SIRS (systemic inflammatory response syndrome)   Hypotension, unspecified   ARF (acute renal failure)   Hypokalemia   OBESITY, MORBID   ANEMIA, IRON DEFICIENCY NEC   HYPERTENSION   PULMONARY EMBOLISM   DVT   GASTROESOPHAGEAL REFLUX DISEASE   Influenza A   Anemia    Time spent: [place] MD [facility] Pager [phone]. If 7PM-7AM, please contact night-coverage at [email or web address], password [email or web address] 05/19/2013, 3:14 PM  LOS: 3 days

## 2013-05-20 LAB — PROTIME-INR
INR: 1.41 (ref 0.00–1.49)
Prothrombin Time: 16.9 seconds — ABNORMAL HIGH (ref 11.6–15.2)

## 2013-05-20 LAB — BASIC METABOLIC PANEL
BUN: 14 mg/dL (ref 6–23)
CHLORIDE: 109 meq/L (ref 96–112)
CO2: 20 meq/L (ref 19–32)
Calcium: 8.9 mg/dL (ref 8.4–10.5)
Creatinine, Ser: 1.08 mg/dL (ref 0.50–1.10)
GFR calc non Af Amer: 63 mL/min — ABNORMAL LOW (ref >90)
GFR, EST AFRICAN AMERICAN: 73 mL/min — AB (ref >90)
Glucose, Bld: 82 mg/dL (ref 70–99)
POTASSIUM: 4.4 meq/L (ref 3.7–5.3)
Sodium: 142 mEq/L (ref 137–147)

## 2013-05-20 MED ORDER — LEVOFLOXACIN 750 MG PO TABS: 750.0000 mg | ORAL_TABLET | ORAL | Status: AC

## 2013-05-20 MED ORDER — WARFARIN SODIUM 2.5 MG PO TABS: 12.5000 mg | ORAL_TABLET | Freq: Once | ORAL | Status: DC

## 2013-05-20 MED ORDER — OSELTAMIVIR PHOSPHATE 75 MG PO CAPS: 75.0000 mg | ORAL_CAPSULE | Freq: Two times a day (BID) | ORAL | Status: AC

## 2013-05-20 MED ORDER — GUAIFENESIN ER 600 MG PO TB12: 1200.0000 mg | ORAL_TABLET | Freq: Two times a day (BID) | ORAL | Status: AC

## 2013-05-20 MED ORDER — INFLUENZA VAC SPLIT QUAD 0.5 ML IM SUSP: 0.5000 mL | INTRAMUSCULAR | Status: DC

## 2013-05-20 MED ORDER — AMLODIPINE BESYLATE 10 MG PO TABS: 10.0000 mg | ORAL_TABLET | Freq: Every day | ORAL | Status: DC

## 2013-05-20 MED ORDER — ENOXAPARIN SODIUM 150 MG/ML ~~LOC~~ SOLN: 150.0000 mg | Freq: Two times a day (BID) | SUBCUTANEOUS | Status: DC

## 2013-05-20 MED ORDER — WARFARIN SODIUM 10 MG PO TABS
12.5000 mg | ORAL_TABLET | Freq: Once | ORAL | Status: DC
  Filled 2013-05-20: qty 1

## 2013-05-20 NOTE — Discharge Summary (Signed)
Physician Discharge Summary  CYENTHIA SPEGAL [id number] DOB: 06-03-1970 DOA: 05/16/2013  PCP: Benito Mccreedy, MD  Admit date: 05/16/2013 Discharge date: 05/20/2013  Time spent: 65 minutes  Recommendations for Outpatient Follow-up:  1. Followup with OSEI-BONSU,GEORGE, MD on Thursday, 05/23/2013 for PT/INR check and further recommendations on her Coumadin dosing as patient is being discharged on full dose Lovenox as well as Coumadin 12.5 mg daily. Patient also followup with PCP on that day for hospital followup. On followup patient will need a basic metabolic profile done to followup on electrolytes and renal function. A CBC also needs to be checked to followup on H&H. Patient's blood pressure needs to be reassessed as her blood pressure regimen has been changed and patient is currently on Norvasc 10 mg daily.  Discharge Diagnoses:  Principal Problem:   Acute respiratory failure Active Problems:   CAP (community acquired pneumonia): Probable   SIRS (systemic inflammatory response syndrome)   Hypotension, unspecified   ARF (acute renal failure)   Hypokalemia   OBESITY, MORBID   ANEMIA, IRON DEFICIENCY NEC   HYPERTENSION   PULMONARY EMBOLISM   DVT   GASTROESOPHAGEAL REFLUX DISEASE   Influenza A   Anemia   Discharge Condition: Stable and improved  Diet recommendation: Regular  Filed Weights   05/16/13 1900 05/17/13 0100 05/18/13 2027  Weight: 154 kg (339 lb 8.1 oz) 151.3 kg (333 lb 8.9 oz) 151.3 kg (333 lb 8.9 oz)    History of present illness:  Pt is 43 yo female who presents to [facility] ED with main concern of 2 days history of progressively worsening shortness of breat associated with productive cough of yellow sputum, fevers, chills, poor oral intake, malaise. She denies specific alleviating or aggravating factors, no sick contacts or exposures, no similar events in the past. She went to [facility] and was found to be tachypneic with RR in 30's and tachycardic with HR in low 100's. Due to  respiratory distress she was sent to [facility] ED. 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 She reports compliance with medications. She denies chest pain other than the discomfort that occurs with coughing spells. No specific abdominal or urinary concerns.  In ED, pt noted to be in mild to moderate respiratory distress with tachypnea and tachycardia and initially requiring BiPAP. She has responded well and was placed on NRB. BMP with K 3.1, Cr above baseline of 1.5 (in DE Cr > 3). TRH asked to admit to SDU for further evaluation and management   Hospital Course:  #1 acute respiratory distress  Likely secondary to community acquired pneumonia and Influenza A. Patient had presented with fever, cough, chills. Influenza PCR was positive for Influenza A. Urine strep pneumococcus was positive. Urine Legionella antigen was negative. Patient was initially placed in the step down unit placed empirically on IV Rocephin and IV azithromycin as well as IV fluids and supportive care. Unable to obtain a VQ scan secondary to weight. Unable to obtain a CT angiogram of the chest secondary to acute renal failure. Patient improved clinically on IV antibiotics as well as Tamiflu. Patient was subsequently transitioned to oral Levaquin which she tolerated. Patient be discharged home on 4 more days of oral Levaquin to complete a one-week course of antibiotic therapy as well as 2 more days of Tamiflu to complete a five-day course. Patient will followup with PCP as outpatient.  #2 hypotension  Likely secondary to dehydration. Patient was placed on IV fluids and placed in the step  down. Chest x-ray which was done was negative however patient's urine strep pneumococcus antigen was positive and due to her presentation she was treated as a community acquired pneumonia as well as placed on Tamiflu for influenza A. Patient improved clinically patient's blood pressure responded to IV fluids and antihypertensive medications were  held. On day of discharge patient's hypotension had resolved and patient be discharged home on 4 more days of oral Levaquin as well as 2 more days of oral Tamiflu. Patient be discharged in stable and improved condition. #3 systemic inflammatory response syndrome  Patient on admission was noted to be hypotensive tachycardic with elevated lactic acid level. Urine strep pneumococcus is positive the likely etiology community acquired pneumonia. Blood cultures were done with no growth to date. Influenza PCR which was done was positive for influenza A. Patient was placed empirically on IV antibiotics and subsequently transitioned to oral antibiotics as well as Tamiflu. Patient improved clinically her blood pressure improved remained afebrile and was stable by day of discharge. See problem #1 and 4.  #4 CAP  Patient presented with resp sxs, fever, cough, urine strep pneum antigen positive. Chest x-ray which was done was negative. Patient was treated empirically with IV Rocephin and azithromycin as a urine strep pneumococcus antigen was positive. Patient improved clinically was subsequently transitioned to oral Levaquin which she tolerated. Patient be discharged on 4 more days of oral Levaquin to complete a one-week course of antibiotic therapy. #5 influenza A.  Positive PCR. Patient was placed on Tamiflu as well as IV fluids and supportive care. Patient improved clinically and be discharged home on 2 more days of Tamiflu to complete a five-day course of therapy.  #6 acute renal failure  Likely secondary to prerenal azotemia as patient was noted to be hypotensive on admission in the setting of ACE inhibitor and diuretics. ACE inhibitor and diuretics were held and not resumed on discharge. Patient was hydrated with IV fluids. Urinalysis with 30 of protein. Patient's urine output improved. Renal ultrasound was unremarkable. Patient's renal function improved and acute renal failure had resolved by day of discharge.   #7 hypokalemia  Likely secondary to GI losses and diuretics. Magnesium level is 1.9. Repleted.  #8 HTN  Patient's blood pressure medications were held on admission secondary to hypotension acute renal failure. Once patient's hypotension had resolved an acute renal failure had improved patient was started on Norvasc 10 mg daily with good blood pressure control. Patient be discharged on Norvasc 10 mg daily and will followup with PCP as outpatient. #9 Hx DVT/PE  On admission patient was noted to have a subtherapeutic INR with history of DVT and PE. Patient was initially placed on full dose heparin and started on Coumadin. Heparin was subsequently transitioned to full dose Lovenox. Patient was maintained on Coumadin. Patient's INR was 1.41 on day of discharge. Patient be discharged home on a full dose Lovenox as well as Coumadin 12.5 mg daily and will followup with PCP for PT/INR check on Thursday, 05/23/2013. Further recommendations in terms of her Coumadin dosing will be done per PCP.  #10 gastroesophageal reflux disease  Patient was maintained on a PPI . #11 sleep apnea  Stable. #12 Anemia  Likely partly dilutional and likely iron deficiency anemia in menstruating female. Anemia panel consistent with anemia of chronic disease. Patient's hemoglobin remained stable. Patient did not have any overt GI bleed. Patient will followup with PCP as outpatient.      Procedures: Chest x-ray 05/17/13 Renal ultrasound 05/17/2013  Consultations:  None  Discharge Exam: Filed Vitals:   05/20/13 0945  BP: 160/100  Pulse: 77  Temp: 99.1 F (37.3 C)  Resp: 18    General: NAD Cardiovascular: RRR Respiratory: CTAB  Discharge Instructions      Discharge Orders   Future Appointments Provider Department Dept Phone   05/23/2013 3:15 PM Zenovia Jarred, MD [facility] [place] [phone]   Future Orders Complete By Expires   Diet general  As directed    Discharge instructions  As  directed    Comments:     Follow up with OSEI-BONSU,GEORGE, MD on Thursday 05/23/13 for coumadin check and hospital follow up. Stay well hydrated.   Increase activity slowly  As directed        Medication List    STOP taking these medications       hydrochlorothiazide 25 MG tablet  Commonly known as:  HYDRODIURIL     lisinopril 20 MG tablet  Commonly known as:  PRINIVIL,ZESTRIL      TAKE these medications       amLODipine 10 MG tablet  Commonly known as:  NORVASC  Take 1 tablet (10 mg total) by mouth daily.     diltiazem 2 % Gel  Apply 1 application topically 4 (four) times daily.     docusate sodium 100 MG capsule  Commonly known as:  COLACE  Take 1 capsule (100 mg total) by mouth every 12 (twelve) hours.     enoxaparin 150 MG/ML injection  Commonly known as:  LOVENOX  Inject 1 mL (150 mg total) into the skin every 12 (twelve) hours. Use until INR is therapeutic as per PCP.     guaiFENesin 600 MG 12 hr tablet  Commonly known as:  MUCINEX  Take 2 tablets (1,200 mg total) by mouth 2 (two) times daily. Take for 4 days then stop.     HYDROcodone-acetaminophen 5-325 MG per tablet  Commonly known as:  NORCO/VICODIN  Take 1 tablet by mouth every 4 (four) hours as needed for pain.     hydrocortisone 2.5 % rectal cream  Commonly known as:  PROCTOSOL HC  Place rectally 2 (two) times daily.     levofloxacin 750 MG tablet  Commonly known as:  LEVAQUIN  Take 1 tablet (750 mg total) by mouth daily. Take for 4 days then stop.     lidocaine 5 % ointment  Commonly known as:  XYLOCAINE  Apply 1 application topically as needed (boils).     oseltamivir 75 MG capsule  Commonly known as:  TAMIFLU  Take 1 capsule (75 mg total) by mouth 2 (two) times daily. Take for 2 days.     phentermine 37.5 MG tablet  Commonly known as:  ADIPEX-P  Take 18.75 mg by mouth daily before breakfast.     warfarin 2.5 MG tablet  Commonly known as:  COUMADIN  Take 5 tablets (12.5 mg total) by  mouth one time only at 6 PM. Take daily until INR checked and then per PCP.       Allergies  Allergen Reactions  . Oxycodone Hcl Nausea Only   Follow-up Information   Follow up with OSEI-BONSU,GEORGE, MD. Schedule an appointment as soon as possible for a visit on 05/23/2013. (f/u for coumadin check and hospital follow up.)    Specialty:  Internal Medicine   Contact information:   [place], [id number] [place] [address] [phone]        The results of significant diagnostics  from this hospitalization (including imaging, microbiology, ancillary and laboratory) are listed below for reference.    Significant Diagnostic Studies: US Renal  05/17/2013   CLINICAL DATA:  Acute renal failure  EXAM: RENAL/URINARY TRACT ULTRASOUND COMPLETE  COMPARISON:  None.  FINDINGS: Right Kidney:  Length: 12.3 cm. Echogenicity within normal limits. No mass or hydronephrosis visualized.  Left Kidney:  Length: 11.6 cm. Echogenicity within normal limits. No mass or hydronephrosis visualized.  Bladder:  Appears normal for degree of bladder distention.  IMPRESSION: Normal renal ultrasound.   Electronically Signed   By: Kathreen Devoid   On: 05/17/2013 15:04   Dg Chest Port 1 View  05/17/2013   CLINICAL DATA:  Shortness of breath.  EXAM: PORTABLE CHEST - 1 VIEW  COMPARISON:  05/16/2013  FINDINGS: The heart size and pulmonary vascularity are normal. The lungs are clear considering a very shallow inspiration. No effusions. No acute osseous abnormality.  IMPRESSION: No acute abnormalities.   Electronically Signed   By: Rozetta Nunnery M.D.   On: 05/17/2013 08:42   Dg Chest Portable 1 View  05/16/2013   CLINICAL DATA:  Shortness of breath.  Respiratory distress.  EXAM: PORTABLE CHEST - 1 VIEW  COMPARISON:  04/05/2012  FINDINGS: The heart size and mediastinal contours are within normal limits. Both lungs are clear. The visualized skeletal structures are unremarkable.  IMPRESSION: No active disease.   Electronically  Signed   By: Earle Gell M.D.   On: 05/16/2013 17:41    Microbiology: Recent Results (from the past 240 hour(s))  CULTURE, BLOOD (ROUTINE X 2)     Status: None   Collection Time    05/16/13  5:27 PM      Result Value Range Status   Specimen Description BLOOD RIGHT ANTECUBITAL   Final   Special Requests BOTTLES DRAWN AEROBIC AND ANAEROBIC 5CC   Final   Culture  Setup Time     Final   Value: 05/17/2013 00:29     Performed at [facility]   Culture     Final   Value:        BLOOD CULTURE RECEIVED NO GROWTH TO DATE CULTURE WILL BE HELD FOR 5 DAYS BEFORE ISSUING A FINAL NEGATIVE REPORT     Performed at [facility]   Report Status PENDING   Incomplete  CULTURE, BLOOD (ROUTINE X 2)     Status: None   Collection Time    05/16/13  9:03 PM      Result Value Range Status   Specimen Description BLOOD ARM RIGHT   Final   Special Requests BOTTLES DRAWN AEROBIC AND ANAEROBIC 5CC   Final   Culture  Setup Time     Final   Value: 05/17/2013 03:02     Performed at [facility]   Culture     Final   Value:        BLOOD CULTURE RECEIVED NO GROWTH TO DATE CULTURE WILL BE HELD FOR 5 DAYS BEFORE ISSUING A FINAL NEGATIVE REPORT     Performed at [facility]   Report Status PENDING   Incomplete  MRSA PCR SCREENING     Status: None   Collection Time    05/17/13  1:00 AM      Result Value Range Status   MRSA by PCR NEGATIVE  NEGATIVE Final   Comment:            The GeneXpert MRSA Assay (FDA     approved for NASAL  specimens     only), is one component of a     comprehensive MRSA colonization     surveillance program. It is not     intended to diagnose MRSA     infection nor to guide or     monitor treatment for     MRSA infections.  URINE CULTURE     Status: None   Collection Time    05/17/13  1:41 AM      Result Value Range Status   Specimen Description URINE, CLEAN CATCH   Final   Special Requests NONE   Final   Culture  Setup Time     Final   Value:  05/17/2013 06:22     Performed at [facility] Count     Final   Value: 15,000 COLONIES/ML     Performed at [facility]   Culture     Final   Value: Multiple bacterial morphotypes present, none predominant. Suggest appropriate recollection if clinically indicated.     Performed at [facility]   Report Status 05/18/2013 FINAL   Final  CULTURE, EXPECTORATED SPUTUM-ASSESSMENT     Status: None   Collection Time    05/17/13  5:20 AM      Result Value Range Status   Specimen Description SPUTUM   Final   Special Requests NONE   Final   Sputum evaluation     Final   Value: THIS SPECIMEN IS ACCEPTABLE. RESPIRATORY CULTURE REPORT TO FOLLOW.   Report Status 05/17/2013 FINAL   Final  GRAM STAIN     Status: None   Collection Time    05/17/13  5:20 AM      Result Value Range Status   Specimen Description SPUTUM   Final   Special Requests NONE   Final   Gram Stain     Final   Value: MODERATE WBC PRESENT, PREDOMINANTLY MONONUCLEAR     MODERATE GRAM POSITIVE COCCI IN PAIRS IN CHAINS     RARE GRAM NEGATIVE RODS     Gram Stain Report Called to,Read Back By and Verified With: HOPPER,M RN @ [phone] 05/17/13 LEONARD,A   Report Status 05/17/2013 FINAL   Final  CULTURE, RESPIRATORY (NON-EXPECTORATED)     Status: None   Collection Time    05/17/13  5:20 AM      Result Value Range Status   Specimen Description SPUTUM   Final   Special Requests NONE   Final   Gram Stain     Final   Value: MODERATE WBC PRESENT, PREDOMINANTLY MONONUCLEAR     MODERATE SQUAMOUS EPITHELIAL CELLS PRESENT     MODERATE GRAM POSITIVE COCCI     IN PAIRS IN CHAINS RARE GRAM NEGATIVE RODS     Performed at [facility]   Culture     Final   Value: NORMAL OROPHARYNGEAL FLORA     Performed at [facility]   Report Status 05/19/2013 FINAL   Final     Labs: Basic Metabolic Panel:  Recent Labs Lab 05/16/13 1800 05/17/13 0335 05/18/13 0803 05/19/13 0715 05/20/13 0731  NA 138  139 141 139 142  K 3.1* 3.2* 4.3 4.0 4.4  CL 100 103 109 109 109  CO2 19 21 20  18* 20  GLUCOSE 104* 111* 92 81 82  BUN 38* 39* 27* 19 14  CREATININE 3.64* 2.99* 1.46* 1.18* 1.08  CALCIUM 8.7 8.4 8.2* 8.5 8.9  MG  --  1.9  --   --   --  PHOS  --  3.1  --   --   --    Liver Function Tests: No results found for this basename: AST, ALT, ALKPHOS, BILITOT, PROT, ALBUMIN,  in the last 168 hours No results found for this basename: LIPASE, AMYLASE,  in the last 168 hours No results found for this basename: AMMONIA,  in the last 168 hours CBC:  Recent Labs Lab 05/16/13 1800 05/17/13 0335 05/18/13 0803 05/19/13 0715  WBC 7.6 6.7 4.1 5.0  NEUTROABS 5.0  --   --   --   HGB 12.3 12.0 10.6* 10.6*  HCT 35.4* 34.4* 30.7* 30.9*  MCV 82.3 84.1 84.6 85.1  PLT 180 178 176 198   Cardiac Enzymes: No results found for this basename: CKTOTAL, CKMB, CKMBINDEX, TROPONINI,  in the last 168 hours BNP: BNP (last 3 results)  Recent Labs  05/17/13 0335  PROBNP 54.0   CBG: No results found for this basename: GLUCAP,  in the last 168 hours     Signed:  [facility] M.D. [facility] 05/20/2013, 12:23 PM

## 2013-05-20 NOTE — Progress Notes (Signed)
ANTICOAGULATION CONSULT NOTE - Follow Up Consult  Pharmacy Consult for Lovenox and Coumadin Indication: hx DVT/PE and r/o new pulmonary embolus   Allergies  Allergen Reactions  . Oxycodone Hcl Nausea Only    Patient Measurements: Height: 5\' 8"  (172.7 cm) Weight: 333 lb 8.9 oz (151.3 kg) IBW/kg (Calculated) : 63.9 Heparin Dosing Weight: 104 kg  Vital Signs: Temp: 97.2 F (36.2 C) (01/04 2120) Temp src: Oral (01/04 2120) BP: 148/72 mmHg (01/04 2120) Pulse Rate: 72 (01/04 2120)  Labs:  Recent Labs  05/17/13 2320 05/18/13 0803 05/18/13 1035 05/19/13 0715 05/20/13 0731  HGB  --  10.6*  --  10.6*  --   HCT  --  30.7*  --  30.9*  --   PLT  --  176  --  198  --   LABPROT  --   --  12.9 14.6 16.9*  INR  --   --  0.99 1.16 1.41  HEPARINUNFRC 0.93* 0.59  --  0.55  --   CREATININE  --  1.46*  --  1.18*  --     Estimated Creatinine Clearance: 98 ml/min (by C-G formula based on Cr of 1.18).   Assessment: Patient is a 43 y.o F on coumadin PTA for DVT/PE. Not able to perform VQ scan d/t weight. Initially started on heparin bridge + warfarin, now to transition to treatment dose lovenox bridge.   INR slowly trending up = 1.41 today  Home Coumadin dose: 10 mg PO daily  Goal of Therapy:  INR 2-3 Anti-Xa level 0.6-1 units/ml 4hrs after LMWH dose given if needed Monitor platelets by anticoagulation protocol: Yes   Plan:  1) Continue Lovenox 150 mg sq Q 12 hours 2) Coumadin 12.5 mg po x 1 dose at 1800 pm 3) Daily INR  Thank you. Anette Guarneri, PharmD [phone]  05/20/2013 9:14 AM

## 2013-05-20 NOTE — Plan of Care (Signed)
Problem: Discharge Progression Outcomes Goal: Flu vaccine received if indicated Outcome: Not Applicable Date Met:  10/15/54 Md say''She is positive for a flu.let get her get better and get the flu shot then.Marland Kitchen

## 2013-05-20 NOTE — Care Management Note (Signed)
   CARE MANAGEMENT NOTE 05/20/2013  Patient:  Alice [name], Alice [name]   Account Number:  [id number]  Date Initiated:  05/20/2013  Documentation initiated by:  Masaru Chamberlin  Subjective/Objective Assessment:   Noted CM consult for coumadin clinic followup.     Action/Plan:   Met with pt who has PCP, Dr Vista Lawman who has monitored Coumadin previously and she wishes to use Dr Vista Lawman for this again. Will call office and arrange followup when needed.   Anticipated DC Date:  05/20/2013   Anticipated DC Plan:  HOME/SELF CARE         Choice offered to / List presented to:             Status of service:  Completed, signed off Medicare Important Message given?   (If response is "NO", the following Medicare IM given date fields will be blank) Date Medicare IM given:   Date Additional Medicare IM given:    Discharge Disposition:  HOME/SELF CARE  Per UR Regulation:    If discussed at Long Length of Stay Meetings, dates discussed:    Comments:

## 2013-05-20 NOTE — Care Management Note (Signed)
   CARE MANAGEMENT NOTE 05/20/2013  Patient:  Alice [name],Alice [name]   Account Number:  [id number]  Date Initiated:  05/20/2013  Documentation initiated by:  ROYAL,CHERYL  Subjective/Objective Assessment:   Noted CM consult for coumadin clinic followup.     Action/Plan:   Met with pt who has PCP, Dr Osei-Bonsu who has monitored Coumadin previously and she wishes to use Dr Osei-Bonsu for this again. Will call office and arrange followup when needed.  Pt will make her own appointment.   Anticipated DC Date:  05/20/2013   Anticipated DC Plan:  HOME/SELF CARE         Choice offered to / List presented to:             Status of service:  Completed, signed off Medicare Important Message given?   (If response is "NO", the following Medicare IM given date fields will be blank) Date Medicare IM given:   Date Additional Medicare IM given:    Discharge Disposition:  HOME/SELF CARE  Per UR Regulation:    If discussed at Long Length of Stay Meetings, dates discussed:    Comments:  05/20/2013 Pt will make her own appointment with Dr Osei-Bonsu for Thur. May 23, 2013 for hospital followup and Coumadin monitoring.   CRoyal RN MPH, case manager [phone]  

## 2013-05-23 ENCOUNTER — Encounter (INDEPENDENT_AMBULATORY_CARE_PROVIDER_SITE_OTHER): Payer: Medicare Other | Admitting: General Surgery

## 2013-05-23 DIAGNOSIS — R059 Cough, unspecified: Secondary | ICD-10-CM | POA: Diagnosis not present

## 2013-05-23 DIAGNOSIS — K649 Unspecified hemorrhoids: Secondary | ICD-10-CM | POA: Diagnosis not present

## 2013-05-23 DIAGNOSIS — F3289 Other specified depressive episodes: Secondary | ICD-10-CM | POA: Diagnosis not present

## 2013-05-23 DIAGNOSIS — N19 Unspecified kidney failure: Secondary | ICD-10-CM | POA: Diagnosis not present

## 2013-05-23 DIAGNOSIS — E559 Vitamin D deficiency, unspecified: Secondary | ICD-10-CM | POA: Diagnosis not present

## 2013-05-23 DIAGNOSIS — R7301 Impaired fasting glucose: Secondary | ICD-10-CM | POA: Diagnosis not present

## 2013-05-23 DIAGNOSIS — F329 Major depressive disorder, single episode, unspecified: Secondary | ICD-10-CM | POA: Diagnosis not present

## 2013-05-23 DIAGNOSIS — I10 Essential (primary) hypertension: Secondary | ICD-10-CM | POA: Diagnosis not present

## 2013-05-23 DIAGNOSIS — J40 Bronchitis, not specified as acute or chronic: Secondary | ICD-10-CM | POA: Diagnosis not present

## 2013-05-23 LAB — CULTURE, BLOOD (ROUTINE X 2)
CULTURE: NO GROWTH
Culture: NO GROWTH

## 2013-05-27 DIAGNOSIS — I82409 Acute embolism and thrombosis of unspecified deep veins of unspecified lower extremity: Secondary | ICD-10-CM | POA: Diagnosis not present

## 2013-05-27 DIAGNOSIS — F329 Major depressive disorder, single episode, unspecified: Secondary | ICD-10-CM | POA: Diagnosis not present

## 2013-05-27 DIAGNOSIS — F3289 Other specified depressive episodes: Secondary | ICD-10-CM | POA: Diagnosis not present

## 2013-05-27 DIAGNOSIS — Z7901 Long term (current) use of anticoagulants: Secondary | ICD-10-CM | POA: Diagnosis not present

## 2013-05-27 DIAGNOSIS — N19 Unspecified kidney failure: Secondary | ICD-10-CM | POA: Diagnosis not present

## 2013-05-27 DIAGNOSIS — I10 Essential (primary) hypertension: Secondary | ICD-10-CM | POA: Diagnosis not present

## 2013-05-27 DIAGNOSIS — K649 Unspecified hemorrhoids: Secondary | ICD-10-CM | POA: Diagnosis not present

## 2013-05-27 DIAGNOSIS — R7301 Impaired fasting glucose: Secondary | ICD-10-CM | POA: Diagnosis not present

## 2013-05-28 ENCOUNTER — Encounter (INDEPENDENT_AMBULATORY_CARE_PROVIDER_SITE_OTHER): Payer: Self-pay | Admitting: General Surgery

## 2013-06-17 DIAGNOSIS — R7301 Impaired fasting glucose: Secondary | ICD-10-CM | POA: Diagnosis not present

## 2013-06-17 DIAGNOSIS — Z7901 Long term (current) use of anticoagulants: Secondary | ICD-10-CM | POA: Diagnosis not present

## 2013-06-17 DIAGNOSIS — K649 Unspecified hemorrhoids: Secondary | ICD-10-CM | POA: Diagnosis not present

## 2013-06-17 DIAGNOSIS — N19 Unspecified kidney failure: Secondary | ICD-10-CM | POA: Diagnosis not present

## 2013-06-17 DIAGNOSIS — I10 Essential (primary) hypertension: Secondary | ICD-10-CM | POA: Diagnosis not present

## 2013-06-17 DIAGNOSIS — F3289 Other specified depressive episodes: Secondary | ICD-10-CM | POA: Diagnosis not present

## 2013-06-17 DIAGNOSIS — F329 Major depressive disorder, single episode, unspecified: Secondary | ICD-10-CM | POA: Diagnosis not present

## 2013-06-17 DIAGNOSIS — I82409 Acute embolism and thrombosis of unspecified deep veins of unspecified lower extremity: Secondary | ICD-10-CM | POA: Diagnosis not present

## 2013-06-24 DIAGNOSIS — F3289 Other specified depressive episodes: Secondary | ICD-10-CM | POA: Diagnosis not present

## 2013-06-24 DIAGNOSIS — I82409 Acute embolism and thrombosis of unspecified deep veins of unspecified lower extremity: Secondary | ICD-10-CM | POA: Diagnosis not present

## 2013-06-24 DIAGNOSIS — N19 Unspecified kidney failure: Secondary | ICD-10-CM | POA: Diagnosis not present

## 2013-06-24 DIAGNOSIS — I10 Essential (primary) hypertension: Secondary | ICD-10-CM | POA: Diagnosis not present

## 2013-06-24 DIAGNOSIS — K649 Unspecified hemorrhoids: Secondary | ICD-10-CM | POA: Diagnosis not present

## 2013-06-24 DIAGNOSIS — Z7901 Long term (current) use of anticoagulants: Secondary | ICD-10-CM | POA: Diagnosis not present

## 2013-06-24 DIAGNOSIS — R7301 Impaired fasting glucose: Secondary | ICD-10-CM | POA: Diagnosis not present

## 2013-06-24 DIAGNOSIS — F329 Major depressive disorder, single episode, unspecified: Secondary | ICD-10-CM | POA: Diagnosis not present

## 2013-07-18 DIAGNOSIS — F329 Major depressive disorder, single episode, unspecified: Secondary | ICD-10-CM | POA: Diagnosis not present

## 2013-07-18 DIAGNOSIS — R7301 Impaired fasting glucose: Secondary | ICD-10-CM | POA: Diagnosis not present

## 2013-07-18 DIAGNOSIS — I82409 Acute embolism and thrombosis of unspecified deep veins of unspecified lower extremity: Secondary | ICD-10-CM | POA: Diagnosis not present

## 2013-07-18 DIAGNOSIS — F3289 Other specified depressive episodes: Secondary | ICD-10-CM | POA: Diagnosis not present

## 2013-07-18 DIAGNOSIS — K649 Unspecified hemorrhoids: Secondary | ICD-10-CM | POA: Diagnosis not present

## 2013-07-18 DIAGNOSIS — I10 Essential (primary) hypertension: Secondary | ICD-10-CM | POA: Diagnosis not present

## 2013-07-18 DIAGNOSIS — N19 Unspecified kidney failure: Secondary | ICD-10-CM | POA: Diagnosis not present

## 2013-07-18 DIAGNOSIS — Z7901 Long term (current) use of anticoagulants: Secondary | ICD-10-CM | POA: Diagnosis not present

## 2013-08-20 DIAGNOSIS — Z86718 Personal history of other venous thrombosis and embolism: Secondary | ICD-10-CM | POA: Diagnosis not present

## 2013-08-20 DIAGNOSIS — I10 Essential (primary) hypertension: Secondary | ICD-10-CM | POA: Diagnosis not present

## 2013-09-19 DIAGNOSIS — I10 Essential (primary) hypertension: Secondary | ICD-10-CM | POA: Diagnosis not present

## 2013-11-05 DIAGNOSIS — R7301 Impaired fasting glucose: Secondary | ICD-10-CM | POA: Diagnosis not present

## 2013-11-05 DIAGNOSIS — F3289 Other specified depressive episodes: Secondary | ICD-10-CM | POA: Diagnosis not present

## 2013-11-05 DIAGNOSIS — I10 Essential (primary) hypertension: Secondary | ICD-10-CM | POA: Diagnosis not present

## 2013-11-05 DIAGNOSIS — I82409 Acute embolism and thrombosis of unspecified deep veins of unspecified lower extremity: Secondary | ICD-10-CM | POA: Diagnosis not present

## 2013-11-05 DIAGNOSIS — K3189 Other diseases of stomach and duodenum: Secondary | ICD-10-CM | POA: Diagnosis not present

## 2013-11-05 DIAGNOSIS — F329 Major depressive disorder, single episode, unspecified: Secondary | ICD-10-CM | POA: Diagnosis not present

## 2013-11-05 DIAGNOSIS — Z7901 Long term (current) use of anticoagulants: Secondary | ICD-10-CM | POA: Diagnosis not present

## 2013-11-05 DIAGNOSIS — K649 Unspecified hemorrhoids: Secondary | ICD-10-CM | POA: Diagnosis not present

## 2013-11-05 DIAGNOSIS — N19 Unspecified kidney failure: Secondary | ICD-10-CM | POA: Diagnosis not present

## 2013-11-25 ENCOUNTER — Inpatient Hospital Stay (HOSPITAL_COMMUNITY): Payer: Medicare Other

## 2013-11-25 ENCOUNTER — Inpatient Hospital Stay (HOSPITAL_COMMUNITY)
Admission: AD | Admit: 2013-11-25 | Discharge: 2013-11-25 | Disposition: A | Discharge status: Home / Self Care | Payer: Medicare Other | Source: Ambulatory Visit | Attending: Family Medicine | Admitting: Family Medicine

## 2013-11-25 ENCOUNTER — Encounter (HOSPITAL_COMMUNITY): Payer: Self-pay | Admitting: *Deleted

## 2013-11-25 DIAGNOSIS — I10 Essential (primary) hypertension: Secondary | ICD-10-CM | POA: Insufficient documentation

## 2013-11-25 DIAGNOSIS — N949 Unspecified condition associated with female genital organs and menstrual cycle: Secondary | ICD-10-CM | POA: Insufficient documentation

## 2013-11-25 DIAGNOSIS — K219 Gastro-esophageal reflux disease without esophagitis: Secondary | ICD-10-CM | POA: Diagnosis not present

## 2013-11-25 DIAGNOSIS — Z9884 Bariatric surgery status: Secondary | ICD-10-CM | POA: Diagnosis not present

## 2013-11-25 DIAGNOSIS — D649 Anemia, unspecified: Secondary | ICD-10-CM | POA: Diagnosis not present

## 2013-11-25 DIAGNOSIS — A5901 Trichomonal vulvovaginitis: Secondary | ICD-10-CM | POA: Diagnosis not present

## 2013-11-25 DIAGNOSIS — G4730 Sleep apnea, unspecified: Secondary | ICD-10-CM | POA: Insufficient documentation

## 2013-11-25 LAB — URINALYSIS, ROUTINE W REFLEX MICROSCOPIC
Bilirubin Urine: NEGATIVE
GLUCOSE, UA: NEGATIVE mg/dL
KETONES UR: NEGATIVE mg/dL
NITRITE: NEGATIVE
PH: 6 (ref 5.0–8.0)
Protein, ur: 30 mg/dL — AB
Specific Gravity, Urine: 1.015 (ref 1.005–1.030)
Urobilinogen, UA: 4 mg/dL — ABNORMAL HIGH (ref 0.0–1.0)

## 2013-11-25 LAB — POCT PREGNANCY, URINE: Preg Test, Ur: NEGATIVE

## 2013-11-25 LAB — URINE MICROSCOPIC-ADD ON

## 2013-11-25 LAB — WET PREP, GENITAL: Yeast Wet Prep HPF POC: NONE SEEN

## 2013-11-25 MED ORDER — METRONIDAZOLE 500 MG PO TABS
1500.0000 mg | ORAL_TABLET | Freq: Once | ORAL | Status: DC
  Filled 2013-11-25: qty 3

## 2013-11-25 MED ORDER — HYDROCODONE-ACETAMINOPHEN 5-325 MG PO TABS: 1.0000 | ORAL_TABLET | Freq: Four times a day (QID) | ORAL | Status: DC | PRN

## 2013-11-25 MED ORDER — METRONIDAZOLE 500 MG PO TABS
2000.0000 mg | ORAL_TABLET | Freq: Once | ORAL | Status: AC
  Administered 2013-11-25: 2000 mg via ORAL
  Filled 2013-11-25: qty 4

## 2013-11-25 MED ORDER — METRONIDAZOLE 500 MG PO TABS: 2000.0000 mg | ORAL_TABLET | Freq: Once | ORAL | Status: DC

## 2013-11-25 MED ORDER — ONDANSETRON 4 MG PO TBDP
4.0000 mg | ORAL_TABLET | Freq: Once | ORAL | Status: AC
  Administered 2013-11-25: 4 mg via ORAL
  Filled 2013-11-25: qty 1

## 2013-11-25 MED ORDER — ACETAMINOPHEN 325 MG PO TABS: 650.0000 mg | ORAL_TABLET | Freq: Once | ORAL | Status: DC

## 2013-11-25 MED ORDER — DOCUSATE SODIUM 100 MG PO CAPS: 100.0000 mg | ORAL_CAPSULE | Freq: Two times a day (BID) | ORAL | Status: DC

## 2013-11-25 NOTE — MAU Note (Signed)
Pt stated she has been having pelvic pain off and on for 2 weeks now and started having vaginal bleeding on Thursday like a period. Bleeding has stopped. usually does not have periods since she has been on meriena.

## 2013-11-25 NOTE — Discharge Instructions (Signed)
Trichomoniasis Trichomoniasis is an infection caused by an organism called Trichomonas. The infection can affect both women and men. In women, the outer female genitalia and the vagina are affected. In men, the penis is mainly affected, but the prostate and other reproductive organs can also be involved. Trichomoniasis is a sexually transmitted infection (STI) and is most often passed to another person through sexual contact.  RISK FACTORS  Having unprotected sexual intercourse.  Having sexual intercourse with an infected partner. SIGNS AND SYMPTOMS  Symptoms of trichomoniasis in women include:  Abnormal gray-green frothy vaginal discharge.  Itching and irritation of the vagina.  Itching and irritation of the area outside the vagina. Symptoms of trichomoniasis in men include:   Penile discharge with or without pain.  Pain during urination. This results from inflammation of the urethra. DIAGNOSIS  Trichomoniasis may be found during a Pap test or physical exam. Your health care provider may use one of the following methods to help diagnose this infection:  Examining vaginal discharge under a microscope. For men, urethral discharge would be examined.  Testing the pH of the vagina with a test tape.  Using a vaginal swab test that checks for the Trichomonas organism. A test is available that provides results within a few minutes.  Doing a culture test for the organism. This is not usually needed. TREATMENT   You may be given medicine to fight the infection. Women should inform their health care provider if they could be or are pregnant. Some medicines used to treat the infection should not be taken during pregnancy.  Your health care provider may recommend over-the-counter medicines or creams to decrease itching or irritation.  Your sexual partner will need to be treated if infected. HOME CARE INSTRUCTIONS   Take all medicine prescribed by your health care provider.  Take  over-the-counter medicine for itching or irritation as directed by your health care provider.  Do not have sexual intercourse while you have the infection.  Women should not douche or wear tampons while they have the infection.  Discuss your infection with your partner. Your partner may have gotten the infection from you, or you may have gotten it from your partner.  Have your sex partner get examined and treated if necessary.  Practice safe, informed, and protected sex.  See your health care provider for other STI testing. SEEK MEDICAL CARE IF:   You still have symptoms after you finish your medicine.  You develop abdominal pain.  You have pain when you urinate.  You have bleeding after sexual intercourse.  You develop a rash.  Your medicine makes you sick or makes you throw up (vomit). Document Released: 10/26/2000 Document Revised: 05/07/2013 Document Reviewed: 02/11/2013 [facility] Patient Information 2015 [place], [place]. This information is not intended to replace advice given to you by your health care provider. Make sure you discuss any questions you have with your health care provider.

## 2013-11-25 NOTE — MAU Note (Signed)
Pt vomited up 3 of the 4 fla\gyl tablets. Gave Zofran and pt able to tolerate remaider of dose.

## 2013-11-25 NOTE — MAU Provider Note (Signed)
Attestation of Attending Supervision of Advanced Practitioner (PA/CNM/NP): Evaluation and management procedures were performed by the Advanced Practitioner under my supervision and collaboration.  I have reviewed the Advanced Practitioner's note and chart, and I agree with the management and plan.  Donnamae Jude, MD [facility] Attending 11/25/2013 5:42 PM

## 2013-11-25 NOTE — MAU Provider Note (Signed)
History     CSN: [id number]  Arrival date and time: 11/25/13 1514   None     Chief Complaint  Patient presents with  . Pelvic Pain   HPI Comments: Alice [name] is a 43 year old G29 female who presents today for worsening pelvic pain for the past 2 weeks. Today she rates her pain at 10/10. Endorses that she has taken ibuprofen at home without relief. She states that she thinks her Mirena is falling out. She has had this Mirena since 2013. She usually does not have periods with Mirena but she reports that she recently had a period from Thursday (7/9)-Sunday (7/12). She denies vaginal discharge and bleeding. Denies dysuria, hematuria, urinary frequency and flank pain.  Pelvic Pain The patient's primary symptoms include pelvic pain. Pertinent negatives include no chills, dysuria, fever, flank pain, frequency or hematuria.     Past Medical History  Diagnosis Date  . Hypertension   . History of blood clots   . Varicose vein   . Anemia   . Sleep apnea   . GERD (gastroesophageal reflux disease)   . Obesity     Past Surgical History  Procedure Laterality Date  . Leg surgery      Right leg  . Gastric bypass      53yrs ago     Family History  Problem Relation Age of Onset  . Kidney disease Mother     kidney transplant    History  Substance Use Topics  . Smoking status: Never Smoker   . Smokeless tobacco: Never Used  . Alcohol Use: No    Allergies:  Allergies  Allergen Reactions  . Oxycodone Hcl Nausea Only    Prescriptions prior to admission  Medication Sig Dispense Refill  . amLODipine (NORVASC) 10 MG tablet Take 1 tablet (10 mg total) by mouth daily.  31 tablet  0  . hydrochlorothiazide (HYDRODIURIL) 25 MG tablet Take 25 mg by mouth daily.      . phentermine (ADIPEX-P) 37.5 MG tablet Take 18.75 mg by mouth daily before breakfast.      . warfarin (COUMADIN) 2.5 MG tablet Take 5 tablets (12.5 mg total) by mouth one time only at 6 PM. Take daily until INR checked and  then per PCP.  30 tablet  0    Review of Systems  Constitutional: Negative for fever and chills.  Genitourinary: Positive for pelvic pain. Negative for dysuria, frequency, hematuria and flank pain.   Physical Exam   Blood pressure 110/68, pulse 86, temperature 98.6 F (37 C), resp. rate 18, height 5\' 8"  (1.727 m).  Physical Exam  Constitutional: She is oriented to person, place, and time. She appears well-developed and well-nourished.  Cardiovascular: Normal rate, regular rhythm and normal heart sounds.  Exam reveals no friction rub.   No murmur heard. Respiratory: Effort normal and breath sounds normal. She has no wheezes. She has no rales.  GI: Soft. She exhibits no distension and no mass. There is tenderness (Mild tenderness to palpation of suprapubic area). There is no rebound and no guarding.  Genitourinary:  Vaginal mucosa and cervix pink and without lesions.No discharge. Ends of IUD strings just barely visible in the cervical os. No cervical motion tenderness. No masses palpated.    Neurological: She is alert and oriented to person, place, and time.  Skin: Skin is warm and dry.   Discharge minimal Sahni (just finished period)  Results for orders placed during the hospital encounter of 11/25/13 (from the past 24  hour(s))  URINALYSIS, ROUTINE W REFLEX MICROSCOPIC     Status: Abnormal   Collection Time    11/25/13  3:11 PM      Result Value Ref Range   Color, Urine YELLOW  YELLOW   APPearance HAZY (*) CLEAR   Specific Gravity, Urine 1.015  1.005 - 1.030   pH 6.0  5.0 - 8.0   Glucose, UA NEGATIVE  NEGATIVE mg/dL   Hgb urine dipstick LARGE (*) NEGATIVE   Bilirubin Urine NEGATIVE  NEGATIVE   Ketones, ur NEGATIVE  NEGATIVE mg/dL   Protein, ur 30 (*) NEGATIVE mg/dL   Urobilinogen, UA 4.0 (*) 0.0 - 1.0 mg/dL   Nitrite NEGATIVE  NEGATIVE   Leukocytes, UA MODERATE (*) NEGATIVE  URINE MICROSCOPIC-ADD ON     Status: Abnormal   Collection Time    11/25/13  3:11 PM      Result  Value Ref Range   Squamous Epithelial / LPF FEW (*) RARE   WBC, UA 11-20  <3 WBC/hpf   RBC / HPF 0-2  <3 RBC/hpf   Urine-Other TRICHOMONAS PRESENT    POCT PREGNANCY, URINE     Status: None   Collection Time    11/25/13  3:30 PM      Result Value Ref Range   Preg Test, Ur NEGATIVE  NEGATIVE  WET PREP, GENITAL     Status: Abnormal   Collection Time    11/25/13  3:52 PM      Result Value Ref Range   Yeast Wet Prep HPF POC NONE SEEN  NONE SEEN   Trich, Wet Prep FEW (*) NONE SEEN   Clue Cells Wet Prep HPF POC FEW (*) NONE SEEN   WBC, Wet Prep HPF POC FEW (*) NONE SEEN    MAU Course  Procedures  MDM Urine Pregnancy Urinalysis GC/Chlamydia Wet Prep   Assessment and Plan  A: 43 y.o. F  With recurrent vaginal pain. Evaluation found to have trichomoniasis. Rare cause of abdominal pain but most consistent with presentation. Will tx and give a few tabs of vicodin and colace to aid with pain. Did NOT perform [place] for IUD placement given alternative diagnosis, however, if pain does not resolve, recommend eval with [place] to confirm IUD placement. Non surgical abdomen.  P: Metronidazole 2 g PO x1 dose Vicodin and colace  Patient was counseled that Trichomoniasis is an STI. She was visibly upset upon hearing this. She was told that her partner also needs treatment. Patient was advised to wait one week after both she and her partner have been treated before returning to sexual activity. She was told to follow up either in clinic or in the MAU if her pelvic pain has not improved or if it worsens in one week.   Gaetano Hawthorne PA-S 11/25/2013, 4:01 PM   I spoke with and examined patient and agree with PA-S's note and plan of care.  Fredrik Rigger, MD [facility] Fellow 11/25/2013 4:58 PM

## 2013-11-26 LAB — GC/CHLAMYDIA PROBE AMP
CT Probe RNA: NEGATIVE
GC PROBE AMP APTIMA: NEGATIVE

## 2013-12-09 DIAGNOSIS — J309 Allergic rhinitis, unspecified: Secondary | ICD-10-CM | POA: Diagnosis not present

## 2014-01-27 DIAGNOSIS — F3289 Other specified depressive episodes: Secondary | ICD-10-CM | POA: Diagnosis not present

## 2014-01-27 DIAGNOSIS — N19 Unspecified kidney failure: Secondary | ICD-10-CM | POA: Diagnosis not present

## 2014-01-27 DIAGNOSIS — E559 Vitamin D deficiency, unspecified: Secondary | ICD-10-CM | POA: Diagnosis not present

## 2014-01-27 DIAGNOSIS — R7301 Impaired fasting glucose: Secondary | ICD-10-CM | POA: Diagnosis not present

## 2014-01-27 DIAGNOSIS — Z7901 Long term (current) use of anticoagulants: Secondary | ICD-10-CM | POA: Diagnosis not present

## 2014-01-27 DIAGNOSIS — I82409 Acute embolism and thrombosis of unspecified deep veins of unspecified lower extremity: Secondary | ICD-10-CM | POA: Diagnosis not present

## 2014-01-27 DIAGNOSIS — K649 Unspecified hemorrhoids: Secondary | ICD-10-CM | POA: Diagnosis not present

## 2014-01-27 DIAGNOSIS — I10 Essential (primary) hypertension: Secondary | ICD-10-CM | POA: Diagnosis not present

## 2014-01-27 DIAGNOSIS — F329 Major depressive disorder, single episode, unspecified: Secondary | ICD-10-CM | POA: Diagnosis not present

## 2014-02-07 DIAGNOSIS — I82409 Acute embolism and thrombosis of unspecified deep veins of unspecified lower extremity: Secondary | ICD-10-CM | POA: Diagnosis not present

## 2014-02-07 DIAGNOSIS — R7301 Impaired fasting glucose: Secondary | ICD-10-CM | POA: Diagnosis not present

## 2014-02-07 DIAGNOSIS — F329 Major depressive disorder, single episode, unspecified: Secondary | ICD-10-CM | POA: Diagnosis not present

## 2014-02-07 DIAGNOSIS — J019 Acute sinusitis, unspecified: Secondary | ICD-10-CM | POA: Diagnosis not present

## 2014-02-07 DIAGNOSIS — I10 Essential (primary) hypertension: Secondary | ICD-10-CM | POA: Diagnosis not present

## 2014-02-07 DIAGNOSIS — Z79899 Other long term (current) drug therapy: Secondary | ICD-10-CM | POA: Diagnosis not present

## 2014-02-07 DIAGNOSIS — N19 Unspecified kidney failure: Secondary | ICD-10-CM | POA: Diagnosis not present

## 2014-02-07 DIAGNOSIS — F3289 Other specified depressive episodes: Secondary | ICD-10-CM | POA: Diagnosis not present

## 2014-02-07 DIAGNOSIS — J029 Acute pharyngitis, unspecified: Secondary | ICD-10-CM | POA: Diagnosis not present

## 2014-02-07 DIAGNOSIS — Z7901 Long term (current) use of anticoagulants: Secondary | ICD-10-CM | POA: Diagnosis not present

## 2014-03-24 DIAGNOSIS — K30 Functional dyspepsia: Secondary | ICD-10-CM | POA: Diagnosis not present

## 2014-03-24 DIAGNOSIS — Z86718 Personal history of other venous thrombosis and embolism: Secondary | ICD-10-CM | POA: Diagnosis not present

## 2014-03-24 DIAGNOSIS — Z7901 Long term (current) use of anticoagulants: Secondary | ICD-10-CM | POA: Diagnosis not present

## 2014-03-24 DIAGNOSIS — G4733 Obstructive sleep apnea (adult) (pediatric): Secondary | ICD-10-CM | POA: Diagnosis not present

## 2014-03-24 DIAGNOSIS — I10 Essential (primary) hypertension: Secondary | ICD-10-CM | POA: Diagnosis not present

## 2014-03-24 DIAGNOSIS — R7309 Other abnormal glucose: Secondary | ICD-10-CM | POA: Diagnosis not present

## 2014-03-24 DIAGNOSIS — E559 Vitamin D deficiency, unspecified: Secondary | ICD-10-CM | POA: Diagnosis not present

## 2014-04-05 ENCOUNTER — Emergency Department (HOSPITAL_COMMUNITY)
Admission: EM | Admit: 2014-04-05 | Discharge: 2014-04-05 | Disposition: A | Discharge status: Home / Self Care | Payer: Medicare Other | Attending: Emergency Medicine | Admitting: Emergency Medicine

## 2014-04-05 ENCOUNTER — Emergency Department (HOSPITAL_COMMUNITY): Payer: Medicare Other

## 2014-04-05 ENCOUNTER — Encounter (HOSPITAL_COMMUNITY): Payer: Self-pay | Admitting: Emergency Medicine

## 2014-04-05 DIAGNOSIS — Z862 Personal history of diseases of the blood and blood-forming organs and certain disorders involving the immune mechanism: Secondary | ICD-10-CM | POA: Diagnosis not present

## 2014-04-05 DIAGNOSIS — Z8719 Personal history of other diseases of the digestive system: Secondary | ICD-10-CM | POA: Diagnosis not present

## 2014-04-05 DIAGNOSIS — Z86711 Personal history of pulmonary embolism: Secondary | ICD-10-CM | POA: Diagnosis not present

## 2014-04-05 DIAGNOSIS — Z79899 Other long term (current) drug therapy: Secondary | ICD-10-CM | POA: Insufficient documentation

## 2014-04-05 DIAGNOSIS — Z86718 Personal history of other venous thrombosis and embolism: Secondary | ICD-10-CM | POA: Diagnosis not present

## 2014-04-05 DIAGNOSIS — R51 Headache: Secondary | ICD-10-CM | POA: Diagnosis not present

## 2014-04-05 DIAGNOSIS — H53149 Visual discomfort, unspecified: Secondary | ICD-10-CM | POA: Insufficient documentation

## 2014-04-05 DIAGNOSIS — I10 Essential (primary) hypertension: Secondary | ICD-10-CM | POA: Diagnosis not present

## 2014-04-05 DIAGNOSIS — R519 Headache, unspecified: Secondary | ICD-10-CM

## 2014-04-05 DIAGNOSIS — R42 Dizziness and giddiness: Secondary | ICD-10-CM | POA: Diagnosis not present

## 2014-04-05 DIAGNOSIS — Z7901 Long term (current) use of anticoagulants: Secondary | ICD-10-CM | POA: Diagnosis not present

## 2014-04-05 DIAGNOSIS — E669 Obesity, unspecified: Secondary | ICD-10-CM | POA: Diagnosis not present

## 2014-04-05 LAB — PROTIME-INR
INR: 2.22 — ABNORMAL HIGH (ref 0.00–1.49)
PROTHROMBIN TIME: 24.8 s — AB (ref 11.6–15.2)

## 2014-04-05 MED ORDER — DIPHENHYDRAMINE HCL 50 MG/ML IJ SOLN
25.0000 mg | Freq: Once | INTRAMUSCULAR | Status: AC
  Administered 2014-04-05: 25 mg via INTRAVENOUS
  Filled 2014-04-05: qty 1

## 2014-04-05 MED ORDER — METOCLOPRAMIDE HCL 5 MG/ML IJ SOLN
10.0000 mg | Freq: Once | INTRAMUSCULAR | Status: AC
  Administered 2014-04-05: 10 mg via INTRAVENOUS
  Filled 2014-04-05: qty 2

## 2014-04-05 MED ORDER — SODIUM CHLORIDE 0.9 % IV BOLUS (SEPSIS)
500.0000 mL | Freq: Once | INTRAVENOUS | Status: AC
  Administered 2014-04-05: 500 mL via INTRAVENOUS

## 2014-04-05 NOTE — ED Notes (Signed)
Pt c/o HA into right eye x 2 days

## 2014-04-05 NOTE — Discharge Instructions (Signed)
If you were given medicines take as directed.  If you are on coumadin or contraceptives realize their levels and effectiveness is altered by many different medicines.  If you have any reaction (rash, tongues swelling, other) to the medicines stop taking and see a physician.   Please follow up as directed and return to the ER or see a physician for new or worsening symptoms.  Thank you. Filed Vitals:   04/05/14 1615 04/05/14 1730 04/05/14 1800 04/05/14 1815  BP: 140/87 113/91  111/61  Pulse: 85 82 72   Temp:      Resp: 16     Height:      Weight:      SpO2: 100% 96% 95%

## 2014-04-05 NOTE — ED Provider Notes (Signed)
CSN: [id number]     Arrival date & time 04/05/14  1348 History   First MD Initiated Contact with Patient 04/05/14 1529     Chief Complaint  Patient presents with  . Headache     (Consider location/radiation/quality/duration/timing/severity/associated sxs/prior Treatment) HPI Comments: 43 year old female with history of obesity, anemia, high blood pressure, DVT, pulmonary embolism, on Coumadin, pneumonia presents with headache. Patient had gradual onset headache 2 days prior that gradually improved and then patient had a few intermittent episodes since. Mild radiation to the right ear and right lateral eye, ache and sharp sensation. Patient has had headaches before this is more severe than her normal period patient is on Coumadin, denies hitting her head. No fevers or neck stiffness.  Patient is a 43 y.o. female presenting with headaches. The history is provided by the patient.  Headache Associated symptoms: photophobia   Associated symptoms: no abdominal pain, no back pain, no congestion, no fever, no neck pain, no neck stiffness, no numbness and no vomiting     Past Medical History  Diagnosis Date  . Hypertension   . History of blood clots   . Varicose vein   . Anemia   . Sleep apnea   . GERD (gastroesophageal reflux disease)   . Obesity    Past Surgical History  Procedure Laterality Date  . Leg surgery      Right leg  . Gastric bypass      33yrs ago    Family History  Problem Relation Age of Onset  . Kidney disease Mother     kidney transplant   History  Substance Use Topics  . Smoking status: Never Smoker   . Smokeless tobacco: Never Used  . Alcohol Use: No   OB History    Gravida Para Term Preterm AB TAB SAB Ectopic Multiple Living   0 0 0 0 0 0 0 0 0 0      Review of Systems  Constitutional: Negative for fever and chills.  HENT: Negative for congestion.   Eyes: Positive for photophobia. Negative for visual disturbance.  Respiratory: Negative for shortness of  breath.   Cardiovascular: Negative for chest pain.  Gastrointestinal: Negative for vomiting and abdominal pain.  Genitourinary: Negative for dysuria and flank pain.  Musculoskeletal: Negative for back pain, neck pain and neck stiffness.  Skin: Negative for rash.  Neurological: Positive for headaches. Negative for weakness, light-headedness and numbness.      Allergies  Oxycodone hcl  Home Medications   Prior to Admission medications   Medication Sig Start Date End Date Taking? Authorizing Provider  amLODipine (NORVASC) 10 MG tablet Take 1 tablet (10 mg total) by mouth daily. 05/20/13  Yes [name], MD  hydrochlorothiazide (HYDRODIURIL) 25 MG tablet Take 25 mg by mouth daily.   Yes Historical Provider, MD  warfarin (COUMADIN) 2.5 MG tablet Take 5 tablets (12.5 mg total) by mouth one time only at 6 PM. Take daily until INR checked and then per PCP. Patient taking differently: Take 10 mg by mouth daily.  05/20/13  Yes [name], MD  docusate sodium (COLACE) 100 MG capsule Take 1 capsule (100 mg total) by mouth 2 (two) times daily. Patient not taking: Reported on 04/05/2014 11/25/13   Allen Norris, MD  HYDROcodone-acetaminophen (NORCO/VICODIN) 5-325 MG per tablet Take 1-2 tablets by mouth every 6 (six) hours as needed for moderate pain. Patient not taking: Reported on 04/05/2014 11/25/13   Allen Norris, MD  metroNIDAZOLE (FLAGYL) 500 MG tablet  Take 4 tablets (2,000 mg total) by mouth once. Patient not taking: Reported on 04/05/2014 11/25/13   Allen Norris, MD  phentermine (ADIPEX-P) 37.5 MG tablet Take 18.75 mg by mouth daily before breakfast.    Historical Provider, MD   BP 111/61 mmHg  Pulse 72  Temp(Src) 98.4 F (36.9 C)  Resp 16  Ht 5\' 8"  (1.727 m)  Wt 350 lb (158.759 kg)  BMI 53.23 kg/m2  SpO2 95% Physical Exam  Constitutional: She is oriented to person, place, and time. She appears well-developed and well-nourished.  HENT:  Head: Normocephalic and  atraumatic.  Eyes: Conjunctivae are normal. Right eye exhibits no discharge. Left eye exhibits no discharge.  Neck: Normal range of motion. Neck supple. No tracheal deviation present.  Cardiovascular: Normal rate and regular rhythm.   Pulmonary/Chest: Effort normal and breath sounds normal.  Abdominal: Soft. She exhibits no distension. There is no tenderness. There is no guarding.  Musculoskeletal: She exhibits no edema.  Neurological: She is alert and oriented to person, place, and time. GCS eye subscore is 4. GCS verbal subscore is 5. GCS motor subscore is 6.  5+ strength in UE and LE with f/e at major joints. Sensation to palpation intact in UE and LE. CNs 2-12 grossly intact.  EOMFI.  PERRL.   Finger nose and coordination intact bilateral.   Visual fields intact to finger testing.    Skin: Skin is warm. No rash noted.  Psychiatric: She has a normal mood and affect.  Nursing note and vitals reviewed.   ED Course  Procedures (including critical care time) Labs Review Labs Reviewed  PROTIME-INR - Abnormal; Notable for the following:    Prothrombin Time 24.8 (*)    INR 2.22 (*)    All other components within normal limits    Imaging Review Ct Head Wo Contrast  04/05/2014   CLINICAL DATA:  43 year old female with right-sided headache and dizziness.  EXAM: CT HEAD WITHOUT CONTRAST  TECHNIQUE: Contiguous axial images were obtained from the base of the skull through the vertex without intravenous contrast.  COMPARISON:  09/24/2010 CT  FINDINGS: No intracranial abnormalities are identified, including mass lesion or mass effect, hydrocephalus, extra-axial fluid collection, midline shift, hemorrhage, or acute infarction.  The visualized bony calvarium is unremarkable.  IMPRESSION: Unremarkable noncontrast head CT   Electronically Signed   By: Hassan Rowan M.D.   On: 04/05/2014 16:52     EKG Interpretation None      MDM   Final diagnoses:  Right-sided headache   Patient on Coumadin  presents with headache different than her normal and more severe than her normal, gradual onset. Plan for screening CT head look for signs of bleeding, normal neuro exam the ER. Pain medicines and INR.  Patient improved significantly on recheck, CT scan no acute bleeding. Patient comfortable with outpatient follow-up.  Results and differential diagnosis were discussed with the patient/parent/guardian. Close follow up outpatient was discussed, comfortable with the plan.   Medications  sodium chloride 0.9 % bolus 500 mL (500 mLs Intravenous New Bag/Given 04/05/14 1711)  metoCLOPramide (REGLAN) injection 10 mg (10 mg Intravenous Given 04/05/14 1711)  diphenhydrAMINE (BENADRYL) injection 25 mg (25 mg Intravenous Given 04/05/14 1711)    Filed Vitals:   04/05/14 1615 04/05/14 1730 04/05/14 1800 04/05/14 1815  BP: 140/87 113/91  111/61  Pulse: 85 82 72   Temp:      Resp: 16     Height:      Weight:  SpO2: 100% 96% 95%     Final diagnoses:  Right-sided headache        Mariea Clonts, MD 04/05/14 1845

## 2014-04-21 ENCOUNTER — Other Ambulatory Visit: Payer: Self-pay | Admitting: Internal Medicine

## 2014-04-21 DIAGNOSIS — Z1231 Encounter for screening mammogram for malignant neoplasm of breast: Secondary | ICD-10-CM

## 2014-04-24 ENCOUNTER — Ambulatory Visit
Admission: RE | Admit: 2014-04-24 | Discharge: 2014-04-24 | Disposition: A | Discharge status: Home / Self Care | Payer: Medicare Other | Source: Ambulatory Visit | Attending: Internal Medicine | Admitting: Internal Medicine

## 2014-04-24 DIAGNOSIS — Z1231 Encounter for screening mammogram for malignant neoplasm of breast: Secondary | ICD-10-CM | POA: Diagnosis not present

## 2014-06-02 DIAGNOSIS — R7309 Other abnormal glucose: Secondary | ICD-10-CM | POA: Diagnosis not present

## 2014-06-02 DIAGNOSIS — R05 Cough: Secondary | ICD-10-CM | POA: Diagnosis not present

## 2014-06-18 DIAGNOSIS — G4733 Obstructive sleep apnea (adult) (pediatric): Secondary | ICD-10-CM | POA: Diagnosis not present

## 2014-06-18 DIAGNOSIS — K30 Functional dyspepsia: Secondary | ICD-10-CM | POA: Diagnosis not present

## 2014-06-18 DIAGNOSIS — R7309 Other abnormal glucose: Secondary | ICD-10-CM | POA: Diagnosis not present

## 2014-06-18 DIAGNOSIS — Z7901 Long term (current) use of anticoagulants: Secondary | ICD-10-CM | POA: Diagnosis not present

## 2014-06-18 DIAGNOSIS — I10 Essential (primary) hypertension: Secondary | ICD-10-CM | POA: Diagnosis not present

## 2014-06-18 DIAGNOSIS — E559 Vitamin D deficiency, unspecified: Secondary | ICD-10-CM | POA: Diagnosis not present

## 2014-06-18 DIAGNOSIS — Z86718 Personal history of other venous thrombosis and embolism: Secondary | ICD-10-CM | POA: Diagnosis not present

## 2014-06-18 DIAGNOSIS — F329 Major depressive disorder, single episode, unspecified: Secondary | ICD-10-CM | POA: Diagnosis not present

## 2014-08-25 DIAGNOSIS — Z113 Encounter for screening for infections with a predominantly sexual mode of transmission: Secondary | ICD-10-CM | POA: Diagnosis not present

## 2014-08-25 DIAGNOSIS — Z124 Encounter for screening for malignant neoplasm of cervix: Secondary | ICD-10-CM | POA: Diagnosis not present

## 2014-08-25 DIAGNOSIS — I10 Essential (primary) hypertension: Secondary | ICD-10-CM | POA: Diagnosis not present

## 2014-08-25 DIAGNOSIS — Z86718 Personal history of other venous thrombosis and embolism: Secondary | ICD-10-CM | POA: Diagnosis not present

## 2014-08-25 DIAGNOSIS — Z7901 Long term (current) use of anticoagulants: Secondary | ICD-10-CM | POA: Diagnosis not present

## 2014-08-25 DIAGNOSIS — E559 Vitamin D deficiency, unspecified: Secondary | ICD-10-CM | POA: Diagnosis not present

## 2014-08-25 DIAGNOSIS — K30 Functional dyspepsia: Secondary | ICD-10-CM | POA: Diagnosis not present

## 2014-08-25 DIAGNOSIS — F329 Major depressive disorder, single episode, unspecified: Secondary | ICD-10-CM | POA: Diagnosis not present

## 2015-02-17 DIAGNOSIS — Z0100 Encounter for examination of eyes and vision without abnormal findings: Secondary | ICD-10-CM | POA: Diagnosis not present

## 2015-02-17 DIAGNOSIS — F329 Major depressive disorder, single episode, unspecified: Secondary | ICD-10-CM | POA: Diagnosis not present

## 2015-02-17 DIAGNOSIS — E559 Vitamin D deficiency, unspecified: Secondary | ICD-10-CM | POA: Diagnosis not present

## 2015-02-17 DIAGNOSIS — R7309 Other abnormal glucose: Secondary | ICD-10-CM | POA: Diagnosis not present

## 2015-02-17 DIAGNOSIS — Z5181 Encounter for therapeutic drug level monitoring: Secondary | ICD-10-CM | POA: Diagnosis not present

## 2015-02-17 DIAGNOSIS — I10 Essential (primary) hypertension: Secondary | ICD-10-CM | POA: Diagnosis not present

## 2015-02-17 DIAGNOSIS — Z01118 Encounter for examination of ears and hearing with other abnormal findings: Secondary | ICD-10-CM | POA: Diagnosis not present

## 2015-02-17 DIAGNOSIS — G4733 Obstructive sleep apnea (adult) (pediatric): Secondary | ICD-10-CM | POA: Diagnosis not present

## 2015-03-01 ENCOUNTER — Emergency Department (HOSPITAL_COMMUNITY): Payer: Medicare Other

## 2015-03-01 ENCOUNTER — Emergency Department (HOSPITAL_COMMUNITY)
Admission: EM | Admit: 2015-03-01 | Discharge: 2015-03-01 | Disposition: A | Discharge status: Home / Self Care | Payer: Medicare Other | Attending: Emergency Medicine | Admitting: Emergency Medicine

## 2015-03-01 ENCOUNTER — Encounter (HOSPITAL_COMMUNITY): Payer: Self-pay | Admitting: Emergency Medicine

## 2015-03-01 DIAGNOSIS — R079 Chest pain, unspecified: Secondary | ICD-10-CM | POA: Diagnosis not present

## 2015-03-01 DIAGNOSIS — Z7901 Long term (current) use of anticoagulants: Secondary | ICD-10-CM | POA: Diagnosis not present

## 2015-03-01 DIAGNOSIS — I10 Essential (primary) hypertension: Secondary | ICD-10-CM | POA: Insufficient documentation

## 2015-03-01 DIAGNOSIS — R0789 Other chest pain: Secondary | ICD-10-CM | POA: Diagnosis not present

## 2015-03-01 DIAGNOSIS — K219 Gastro-esophageal reflux disease without esophagitis: Secondary | ICD-10-CM

## 2015-03-01 DIAGNOSIS — Z79899 Other long term (current) drug therapy: Secondary | ICD-10-CM | POA: Insufficient documentation

## 2015-03-01 DIAGNOSIS — Z3202 Encounter for pregnancy test, result negative: Secondary | ICD-10-CM | POA: Diagnosis not present

## 2015-03-01 DIAGNOSIS — Z862 Personal history of diseases of the blood and blood-forming organs and certain disorders involving the immune mechanism: Secondary | ICD-10-CM | POA: Diagnosis not present

## 2015-03-01 DIAGNOSIS — Z8669 Personal history of other diseases of the nervous system and sense organs: Secondary | ICD-10-CM | POA: Diagnosis not present

## 2015-03-01 DIAGNOSIS — R1013 Epigastric pain: Secondary | ICD-10-CM

## 2015-03-01 LAB — HEPATIC FUNCTION PANEL
ALT: 52 U/L (ref 14–54)
AST: 147 U/L — ABNORMAL HIGH (ref 15–41)
Albumin: 3.3 g/dL — ABNORMAL LOW (ref 3.5–5.0)
Alkaline Phosphatase: 125 U/L (ref 38–126)
BILIRUBIN INDIRECT: 0.3 mg/dL (ref 0.3–0.9)
BILIRUBIN TOTAL: 0.5 mg/dL (ref 0.3–1.2)
Bilirubin, Direct: 0.2 mg/dL (ref 0.1–0.5)
Total Protein: 7.9 g/dL (ref 6.5–8.1)

## 2015-03-01 LAB — BASIC METABOLIC PANEL
Anion gap: 11 (ref 5–15)
BUN: 16 mg/dL (ref 6–20)
CALCIUM: 8.8 mg/dL — AB (ref 8.9–10.3)
CO2: 25 mmol/L (ref 22–32)
CREATININE: 1.16 mg/dL — AB (ref 0.44–1.00)
Chloride: 99 mmol/L — ABNORMAL LOW (ref 101–111)
GFR calc Af Amer: >60 mL/min (ref >60)
GFR calc non Af Amer: 57 mL/min — ABNORMAL LOW (ref >60)
Glucose, Bld: 111 mg/dL — ABNORMAL HIGH (ref 65–99)
Potassium: 3.1 mmol/L — ABNORMAL LOW (ref 3.5–5.1)
Sodium: 135 mmol/L (ref 135–145)

## 2015-03-01 LAB — LIPASE, BLOOD: Lipase: 39 U/L (ref 22–51)

## 2015-03-01 LAB — PROTIME-INR
INR: 1.77 — ABNORMAL HIGH (ref 0.00–1.49)
PROTHROMBIN TIME: 20.5 s — AB (ref 11.6–15.2)

## 2015-03-01 LAB — I-STAT BETA HCG BLOOD, ED (MC, WL, AP ONLY): I-stat hCG, quantitative: <5 m[IU]/mL (ref <5)

## 2015-03-01 LAB — CBC
HCT: 37.4 % (ref 36.0–46.0)
Hemoglobin: 12.7 g/dL (ref 12.0–15.0)
MCH: 26.6 pg (ref 26.0–34.0)
MCHC: 34 g/dL (ref 30.0–36.0)
MCV: 78.2 fL (ref 78.0–100.0)
Platelets: 288 10*3/uL (ref 150–400)
RBC: 4.78 MIL/uL (ref 3.87–5.11)
RDW: 15.6 % — AB (ref 11.5–15.5)
WBC: 7 10*3/uL (ref 4.0–10.5)

## 2015-03-01 LAB — I-STAT TROPONIN, ED: TROPONIN I, POC: 0 ng/mL (ref 0.00–0.08)

## 2015-03-01 MED ORDER — PANTOPRAZOLE SODIUM 20 MG PO TBEC: 20.0000 mg | DELAYED_RELEASE_TABLET | Freq: Every day | ORAL | Status: DC

## 2015-03-01 MED ORDER — ONDANSETRON HCL 4 MG/2ML IJ SOLN
4.0000 mg | Freq: Once | INTRAMUSCULAR | Status: AC
  Administered 2015-03-01: 4 mg via INTRAVENOUS
  Filled 2015-03-01: qty 2

## 2015-03-01 MED ORDER — HYDROMORPHONE HCL 1 MG/ML IJ SOLN
1.0000 mg | Freq: Once | INTRAMUSCULAR | Status: AC
  Administered 2015-03-01: 1 mg via INTRAVENOUS
  Filled 2015-03-01: qty 1

## 2015-03-01 MED ORDER — IOHEXOL 300 MG/ML  SOLN
100.0000 mL | Freq: Once | INTRAMUSCULAR | Status: AC | PRN
  Administered 2015-03-01: 100 mL via INTRAVENOUS

## 2015-03-01 MED ORDER — GI COCKTAIL ~~LOC~~
30.0000 mL | Freq: Once | ORAL | Status: AC
  Administered 2015-03-01: 30 mL via ORAL
  Filled 2015-03-01: qty 30

## 2015-03-01 MED ORDER — PANTOPRAZOLE SODIUM 40 MG IV SOLR
40.0000 mg | Freq: Once | INTRAVENOUS | Status: AC
  Administered 2015-03-01: 40 mg via INTRAVENOUS
  Filled 2015-03-01: qty 40

## 2015-03-01 MED ORDER — ENOXAPARIN SODIUM 80 MG/0.8ML ~~LOC~~ SOLN
70.0000 mg | SUBCUTANEOUS | Status: DC
  Filled 2015-03-01: qty 0.8

## 2015-03-01 MED ORDER — METOCLOPRAMIDE HCL 5 MG/ML IJ SOLN
10.0000 mg | Freq: Once | INTRAMUSCULAR | Status: AC
  Administered 2015-03-01: 10 mg via INTRAVENOUS
  Filled 2015-03-01: qty 2

## 2015-03-01 MED ORDER — HYDROCODONE-ACETAMINOPHEN 5-325 MG PO TABS: 1.0000 | ORAL_TABLET | ORAL | Status: DC | PRN

## 2015-03-01 MED ORDER — METOCLOPRAMIDE HCL 10 MG PO TABS: 10.0000 mg | ORAL_TABLET | Freq: Four times a day (QID) | ORAL | Status: DC | PRN

## 2015-03-01 NOTE — ED Notes (Signed)
Pt placed in gown and in bed. Pt monitored by pulse ox, bp cuff, and 12-lead. 

## 2015-03-01 NOTE — Discharge Instructions (Signed)
Read the information below.  Use the prescribed medication as directed.  Please discuss all new medications with your pharmacist.  Do not take additional tylenol while taking the prescribed pain medication to avoid overdose.  You may return to the Emergency Department at any time for worsening condition or any new symptoms that concern you.    If you develop high fevers, worsening abdominal pain, uncontrolled vomiting, or are unable to tolerate fluids by mouth, return to the ER for a recheck.    Please follow up with Dr Vista Lawman tomorrow.  Call his office.  You will need to have your INR rechecked closely and your dose may need to be adjusted over the next few days.  Your INR today was 1.7.     Abdominal Pain, Adult Many things can cause abdominal pain. Usually, abdominal pain is not caused by a disease and will improve without treatment. It can often be observed and treated at home. Your health care provider will do a physical exam and possibly order blood tests and X-rays to help determine the seriousness of your pain. However, in many cases, more time must pass before a clear cause of the pain can be found. Before that point, your health care provider may not know if you need more testing or further treatment. HOME CARE INSTRUCTIONS Monitor your abdominal pain for any changes. The following actions may help to alleviate any discomfort you are experiencing:  Only take over-the-counter or prescription medicines as directed by your health care provider.  Do not take laxatives unless directed to do so by your health care provider.  Try a clear liquid diet (broth, tea, or water) as directed by your health care provider. Slowly move to a bland diet as tolerated. SEEK MEDICAL CARE IF:  You have unexplained abdominal pain.  You have abdominal pain associated with nausea or diarrhea.  You have pain when you urinate or have a bowel movement.  You experience abdominal pain that wakes you in the  night.  You have abdominal pain that is worsened or improved by eating food.  You have abdominal pain that is worsened with eating fatty foods.  You have a fever. SEEK IMMEDIATE MEDICAL CARE IF:  Your pain does not go away within 2 hours.  You keep throwing up (vomiting).  Your pain is felt only in portions of the abdomen, such as the right side or the left lower portion of the abdomen.  You pass bloody or black tarry stools. MAKE SURE YOU:  Understand these instructions.  Will watch your condition.  Will get help right away if you are not doing well or get worse.   This information is not intended to replace advice given to you by your health care provider. Make sure you discuss any questions you have with your health care provider.   Document Released: 02/09/2005 Document Revised: 01/21/2015 Document Reviewed: 01/09/2013 Elsevier Interactive Patient Education 2016 [place].  Gastroesophageal Reflux Disease, Adult Normally, food travels down the esophagus and stays in the stomach to be digested. If a person has gastroesophageal reflux disease (GERD), food and stomach acid move back up into the esophagus. When this happens, the esophagus becomes sore and swollen (inflamed). Over time, GERD can make small holes (ulcers) in the lining of the esophagus. HOME CARE Diet  Follow a diet as told by your doctor. You may need to avoid foods and drinks such as:  Coffee and tea (with or without caffeine).  Drinks that contain alcohol.  Energy drinks and sports drinks.  Carbonated drinks or sodas.  Chocolate and cocoa.  Peppermint and mint flavorings.  Garlic and onions.  Horseradish.  Spicy and acidic foods, such as peppers, chili powder, curry powder, vinegar, hot sauces, and BBQ sauce.  Citrus fruit juices and citrus fruits, such as oranges, lemons, and limes.  Tomato-based foods, such as red sauce, chili, salsa, and pizza with red sauce.  Fried and fatty foods,  such as donuts, french fries, potato chips, and high-fat dressings.  High-fat meats, such as hot dogs, rib eye steak, sausage, ham, and bacon.  High-fat dairy items, such as whole milk, butter, and cream cheese.  Eat small meals often. Avoid eating large meals.  Avoid drinking large amounts of liquid with your meals.  Avoid eating meals during the 2-3 hours before bedtime.  Avoid lying down right after you eat.  Do not exercise right after you eat. General Instructions  Pay attention to any changes in your symptoms.  Take over-the-counter and prescription medicines only as told by your doctor. Do not take aspirin, ibuprofen, or other NSAIDs unless your doctor says it is okay.  Do not use any tobacco products, including cigarettes, chewing tobacco, and e-cigarettes. If you need help quitting, ask your doctor.  Wear loose clothes. Do not wear anything tight around your waist.  Raise (elevate) the head of your bed about 6 inches (15 cm).  Try to lower your stress. If you need help doing this, ask your doctor.  If you are overweight, lose an amount of weight that is healthy for you. Ask your doctor about a safe weight loss goal.  Keep all follow-up visits as told by your doctor. This is important. GET HELP IF:  You have new symptoms.  You lose weight and you do not know why it is happening.  You have trouble swallowing, or it hurts to swallow.  You have wheezing or a cough that keeps happening.  Your symptoms do not get better with treatment.  You have a hoarse voice. GET HELP RIGHT AWAY IF:  You have pain in your arms, neck, jaw, teeth, or back.  You feel sweaty, dizzy, or light-headed.  You have chest pain or shortness of breath.  You throw up (vomit) and your throw up looks like blood or coffee grounds.  You pass out (faint).  Your poop (stool) is bloody or black.  You cannot swallow, drink, or eat.   This information is not intended to replace advice  given to you by your health care provider. Make sure you discuss any questions you have with your health care provider.   Document Released: 10/19/2007 Document Revised: 01/21/2015 Document Reviewed: 08/27/2014 Elsevier Interactive Patient Education [employer].

## 2015-03-01 NOTE — Progress Notes (Signed)
ANTICOAGULATION CONSULT NOTE - Initial Consult  Pharmacy Consult for Lovenox Indication: VTE prophylaxis  Allergies  Allergen Reactions  . Oxycodone Hcl Nausea Only    Patient Measurements: Height: 5\' 8"  (172.7 cm) Weight: (!) 350 lb (158.759 kg) IBW/kg (Calculated) : 63.9  Vital Signs: Temp: 97.9 F (36.6 C) (10/16 1033) Temp Source: Oral (10/16 1033) BP: 118/72 mmHg (10/16 1415) Pulse Rate: 91 (10/16 1415)  Labs:  Recent Labs  03/01/15 1051  HGB 12.7  HCT 37.4  PLT 288  LABPROT 20.5*  INR 1.77*  CREATININE 1.16*    Estimated Creatinine Clearance: 100.6 mL/min (by C-G formula based on Cr of 1.16).   Medical History: Past Medical History  Diagnosis Date  . Hypertension   . History of blood clots   . Varicose vein   . Anemia   . Sleep apnea   . GERD (gastroesophageal reflux disease)   . Obesity     Assessment: 44 yo F presents on 10/16 with chest pain. On coumadin PTA for hx of PE. INR subtherapeutic on admission at 1.77. Pharmacy consulted to dose Lovenox for VTE prophylaxis until Coumadin therapeutic.  Goal of Therapy:  INR 2-3 Monitor platelets by anticoagulation protocol: Yes   Plan:  Start enoxaparin 70mg   Q24 Monitor CBC, s/s of bleed   Kilan Banfill J 03/01/2015,3:34 PM

## 2015-03-01 NOTE — ED Notes (Signed)
Patient transported to X-ray 

## 2015-03-01 NOTE — ED Notes (Signed)
PA at bedside.

## 2015-03-01 NOTE — ED Notes (Signed)
Pt reports that she vomited again.

## 2015-03-01 NOTE — ED Notes (Signed)
Pt arrives from home via [facility] c/o substernal CP that woke her from sleep.  Pt reports stabbing pain that "goes all the way through to back".  Pt reports nausea, reports making self vomit without relief.  Pt denies cardiac hx, reports hx PE, takes coumadin.

## 2015-03-01 NOTE — ED Notes (Signed)
EMelina Modena, PA,  at bedside.

## 2015-03-01 NOTE — ED Provider Notes (Signed)
CSN: [id number]     Arrival date & time 03/01/15  1026 History   First MD Initiated Contact with Patient 03/01/15 1100     Chief Complaint  Patient presents with  . Chest Pain     (Consider location/radiation/quality/duration/timing/severity/associated sxs/prior Treatment) HPI   Pt with hx GERD, obesity, pulmonary emboli on coumadin presents with left sided chest pain below her left breast that is sharp and stabbing, radiates through to the left back.  Mild SOB, mild nausea.  The pain woke her up from sleep a little after 7am.    Pt does have hx acid reflux, doesn't take her medication regularly.  Ate chili last night.  Denies fevers, chills, recent illness, cough, recent travel or immobilization, hemoptysis.  Denies bowel, urinary, or vaginal symptoms.  She has a Mirena in place.  Takes her coumadin regularly.    Past Medical History  Diagnosis Date  . Hypertension   . History of blood clots   . Varicose vein   . Anemia   . Sleep apnea   . GERD (gastroesophageal reflux disease)   . Obesity    Past Surgical History  Procedure Laterality Date  . Leg surgery      Right leg  . Gastric bypass      85yrs ago    Family History  Problem Relation Age of Onset  . Kidney disease Mother     kidney transplant   Social History  Substance Use Topics  . Smoking status: Never Smoker   . Smokeless tobacco: Never Used  . Alcohol Use: Yes     Comment: occassionally   OB History    Gravida Para Term Preterm AB TAB SAB Ectopic Multiple Living   0 0 0 0 0 0 0 0 0 0      Review of Systems  All other systems reviewed and are negative.     Allergies  Oxycodone hcl  Home Medications   Prior to Admission medications   Medication Sig Start Date End Date Taking? Authorizing Provider  amLODipine (NORVASC) 10 MG tablet Take 1 tablet (10 mg total) by mouth daily. 05/20/13   [name], MD  docusate sodium (COLACE) 100 MG capsule Take 1 capsule (100 mg total) by mouth 2 (two) times  daily. Patient not taking: Reported on 04/05/2014 11/25/13   Allen Norris, MD  hydrochlorothiazide (HYDRODIURIL) 25 MG tablet Take 25 mg by mouth daily.    Historical Provider, MD  HYDROcodone-acetaminophen (NORCO/VICODIN) 5-325 MG per tablet Take 1-2 tablets by mouth every 6 (six) hours as needed for moderate pain. Patient not taking: Reported on 04/05/2014 11/25/13   Allen Norris, MD  metroNIDAZOLE (FLAGYL) 500 MG tablet Take 4 tablets (2,000 mg total) by mouth once. Patient not taking: Reported on 04/05/2014 11/25/13   Allen Norris, MD  phentermine (ADIPEX-P) 37.5 MG tablet Take 18.75 mg by mouth daily before breakfast.    Historical Provider, MD  warfarin (COUMADIN) 2.5 MG tablet Take 5 tablets (12.5 mg total) by mouth one time only at 6 PM. Take daily until INR checked and then per PCP. Patient taking differently: Take 10 mg by mouth daily.  05/20/13   [name], MD   BP 149/73 mmHg  Pulse 100  Temp(Src) 97.9 F (36.6 C) (Oral)  Resp 12  Ht 5\' 8"  (1.727 m)  Wt 350 lb (158.759 kg)  BMI 53.23 kg/m2  SpO2 100% Physical Exam  Constitutional: She appears well-developed and well-nourished.  Morbidly obese.  Uncomfortable  appearing.    HENT:  Head: Normocephalic and atraumatic.  Neck: Normal range of motion. Neck supple.  Cardiovascular: Normal rate and regular rhythm.   Pulmonary/Chest: Effort normal and breath sounds normal. No respiratory distress. She has no wheezes. She has no rales. She exhibits tenderness (TTP under left breast).  Abdominal: Soft. She exhibits no distension. There is tenderness (epigastric ). There is no rebound and no guarding.  Musculoskeletal:  Left mid back tender to palpation, no skin changes  Neurological: She is alert.  Skin: She is not diaphoretic.  Nursing note and vitals reviewed.   ED Course  Procedures (including critical care time) Labs Review Labs Reviewed  BASIC METABOLIC PANEL - Abnormal; Notable for the following:    Potassium  3.1 (*)    Chloride 99 (*)    Glucose, Bld 111 (*)    Creatinine, Ser 1.16 (*)    Calcium 8.8 (*)    GFR calc non Af Amer 57 (*)    All other components within normal limits  CBC - Abnormal; Notable for the following:    RDW 15.6 (*)    All other components within normal limits  PROTIME-INR - Abnormal; Notable for the following:    Prothrombin Time 20.5 (*)    INR 1.77 (*)    All other components within normal limits  HEPATIC FUNCTION PANEL - Abnormal; Notable for the following:    Albumin 3.3 (*)    AST 147 (*)    All other components within normal limits  LIPASE, BLOOD  I-STAT TROPOININ, ED  I-STAT BETA HCG BLOOD, ED ([facility], [facility], [facility] ONLY)    Imaging Review Dg Chest 2 View  03/01/2015  CLINICAL DATA:  Chest pain EXAM: CHEST  2 VIEW COMPARISON:  05/17/2013 FINDINGS: Cardiomediastinal silhouette is stable. Study is suboptimal by patient's large body habitus. Mild basilar atelectasis. No acute infiltrate or pulmonary edema. Mild degenerative changes lower thoracic spine. IMPRESSION: Suboptimal study by patient's large body habitus. Mild basilar atelectasis. No active disease. Electronically Signed   By: Lahoma Crocker M.D.   On: 03/01/2015 11:49   Ct Abdomen Pelvis W Contrast  03/01/2015  CLINICAL DATA:  Epigastric pain EXAM: CT ABDOMEN AND PELVIS WITH CONTRAST TECHNIQUE: Multidetector CT imaging of the abdomen and pelvis was performed using the standard protocol following bolus administration of intravenous contrast. CONTRAST:  173mL OMNIPAQUE IOHEXOL 300 MG/ML  SOLN COMPARISON:  01/12/2013 FINDINGS: Bibasilar subsegmental atelectasis. Postoperative changes from gastric bypass. Gallbladder is somewhat distended measuring 6 cm in diameter. No obvious inflammatory change. Common bile duct measures 6 mm. Spleen, pancreas, adrenal glands, and kidneys are within normal limits. Normal appendix. Bladder is unremarkable. IUD is in the uterus. Adnexa are within normal limits No free-fluid. 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 Posterior osteophytes at L4-5 results in spinal stenosis. Vacuum disc at L5-S1. No vertebral compression deformity. IMPRESSION: Gallbladder distention without obvious inflammatory changes. Electronically Signed   By: Marybelle Killings M.D.   On: 03/01/2015 14:55      EKG Interpretation   Date/Time:  Sunday March 01 2015 10:29:53 EDT Ventricular Rate:  108 PR Interval:  151 QRS Duration: 85 QT Interval:  360 QTC Calculation: 482 R Axis:   14 Text Interpretation:  Sinus tachycardia Borderline T abnormalities,  diffuse leads Confirmed by DELO  MD, DOUGLAS ([id number]) on 03/01/2015  10:32:38 AM       12 :31 PM Patient reports continued pain.  Palpation over upper abdomen reproduces pain per  patient.  NO guarding, no rebound.    2:09 PM Discussed pt with Dr Stark Jock.  Pt actively vomiting, confirms pain reproduced with palpation.  Denies SOB or any worsening of pain with deep inspiration.    MDM   Final diagnoses:  Epigastric pain  Gastroesophageal reflux disease, esophagitis presence not specified    Afebrile nontoxic patient with morbid obesity hx PE on coumadin, hx GERD not currently on medications, ate chili last night, awoke with upper abdominal pain this morning.  The pain is in her upper abdomen and is not in her chest, it is reproducible.  She states the pain is under her left breast, but due to her body habitus this correlates with her upper abdomen and she confirms this is the source of her pain.  She is not SOB and is not hypoxic.  I doubt that this is a PE.  She is, however, subtherapeutic with her coumadin, given Lovenox here and strongly advised to follow closely with her PCP who monitors her INR.  Her CT shows an enlarged gallbladder but the pain is definitely on the left side on multiple exams.  Labs remarkable for mild hypokalemia, renal insufficiency, increase in AST only.  CBC is normal.  Lipase is normal.  Bilirubin is normal and  other LFTs are normal.  EKG not ischemic.  Pt not tolerating PO after two doses of zofran, though the vomiting is more loud than productive.  Pt signed out to [facility], PA-C, who assumes care of patient pending successful PO trial.  Suspect pt's symptoms due to PUD vs uncontrolled GERD.  Anticipate d/c home with protonix, reglan, #10 norco, close PCP follow up.   Discussed result, findings, treatment, and follow up  with patient.  Pt given return precautions.  Pt verbalizes understanding and agrees with plan.        Clayton Bibles, PA-C 03/01/15 1615  Veryl Speak, MD 03/04/15 [phone]

## 2015-03-01 NOTE — ED Notes (Signed)
Pt tolerating PO fluids

## 2015-03-01 NOTE — ED Notes (Signed)
Patient transported to CT 

## 2015-03-02 NOTE — ED Provider Notes (Signed)
Pt signed out to me by Clayton Bibles PA-C at shift change. Pending PO challenge. If pt can tolerate fluids, pt stable for discharge.   Pt passed fluid challenge. OK to discharge. Return precautions outlined in patient discharge instructions.   VSS.   Dondra Spry [place], PA-C 03/02/15 1838  Malvin Johns, MD 03/02/15 [phone]

## 2015-03-10 DIAGNOSIS — E559 Vitamin D deficiency, unspecified: Secondary | ICD-10-CM | POA: Diagnosis not present

## 2015-03-10 DIAGNOSIS — Z0000 Encounter for general adult medical examination without abnormal findings: Secondary | ICD-10-CM | POA: Diagnosis not present

## 2015-03-10 DIAGNOSIS — F329 Major depressive disorder, single episode, unspecified: Secondary | ICD-10-CM | POA: Diagnosis not present

## 2015-03-10 DIAGNOSIS — R7309 Other abnormal glucose: Secondary | ICD-10-CM | POA: Diagnosis not present

## 2015-03-10 DIAGNOSIS — G4733 Obstructive sleep apnea (adult) (pediatric): Secondary | ICD-10-CM | POA: Diagnosis not present

## 2015-03-10 DIAGNOSIS — I10 Essential (primary) hypertension: Secondary | ICD-10-CM | POA: Diagnosis not present

## 2015-03-20 DIAGNOSIS — G4733 Obstructive sleep apnea (adult) (pediatric): Secondary | ICD-10-CM | POA: Diagnosis not present

## 2015-03-20 DIAGNOSIS — I10 Essential (primary) hypertension: Secondary | ICD-10-CM | POA: Diagnosis not present

## 2015-03-20 DIAGNOSIS — R1013 Epigastric pain: Secondary | ICD-10-CM | POA: Diagnosis not present

## 2015-03-20 DIAGNOSIS — E559 Vitamin D deficiency, unspecified: Secondary | ICD-10-CM | POA: Diagnosis not present

## 2015-03-20 DIAGNOSIS — F329 Major depressive disorder, single episode, unspecified: Secondary | ICD-10-CM | POA: Diagnosis not present

## 2015-03-26 ENCOUNTER — Other Ambulatory Visit: Payer: Self-pay

## 2015-03-26 DIAGNOSIS — Z1231 Encounter for screening mammogram for malignant neoplasm of breast: Secondary | ICD-10-CM

## 2015-04-03 DIAGNOSIS — I10 Essential (primary) hypertension: Secondary | ICD-10-CM | POA: Diagnosis not present

## 2015-04-16 ENCOUNTER — Encounter (HOSPITAL_COMMUNITY): Payer: Self-pay | Admitting: Emergency Medicine

## 2015-04-16 ENCOUNTER — Inpatient Hospital Stay (HOSPITAL_COMMUNITY)
Admission: EM | Admit: 2015-04-16 | Discharge: 2015-04-21 | DRG: 418 | Disposition: A | Discharge status: Home / Self Care | Payer: Medicare Other | Attending: Internal Medicine | Admitting: Internal Medicine

## 2015-04-16 ENCOUNTER — Inpatient Hospital Stay (HOSPITAL_COMMUNITY): Payer: Medicare Other

## 2015-04-16 ENCOUNTER — Emergency Department (HOSPITAL_COMMUNITY): Payer: Medicare Other

## 2015-04-16 DIAGNOSIS — Z7901 Long term (current) use of anticoagulants: Secondary | ICD-10-CM

## 2015-04-16 DIAGNOSIS — K219 Gastro-esophageal reflux disease without esophagitis: Secondary | ICD-10-CM | POA: Diagnosis present

## 2015-04-16 DIAGNOSIS — K8020 Calculus of gallbladder without cholecystitis without obstruction: Secondary | ICD-10-CM | POA: Diagnosis not present

## 2015-04-16 DIAGNOSIS — G4733 Obstructive sleep apnea (adult) (pediatric): Secondary | ICD-10-CM | POA: Diagnosis present

## 2015-04-16 DIAGNOSIS — I739 Peripheral vascular disease, unspecified: Secondary | ICD-10-CM | POA: Diagnosis present

## 2015-04-16 DIAGNOSIS — N183 Chronic kidney disease, stage 3 (moderate): Secondary | ICD-10-CM | POA: Diagnosis present

## 2015-04-16 DIAGNOSIS — K8000 Calculus of gallbladder with acute cholecystitis without obstruction: Principal | ICD-10-CM | POA: Diagnosis present

## 2015-04-16 DIAGNOSIS — E860 Dehydration: Secondary | ICD-10-CM | POA: Diagnosis present

## 2015-04-16 DIAGNOSIS — Z841 Family history of disorders of kidney and ureter: Secondary | ICD-10-CM | POA: Diagnosis not present

## 2015-04-16 DIAGNOSIS — M549 Dorsalgia, unspecified: Secondary | ICD-10-CM | POA: Diagnosis not present

## 2015-04-16 DIAGNOSIS — R0602 Shortness of breath: Secondary | ICD-10-CM | POA: Diagnosis not present

## 2015-04-16 DIAGNOSIS — Z6841 Body Mass Index (BMI) 40.0 and over, adult: Secondary | ICD-10-CM

## 2015-04-16 DIAGNOSIS — Z86711 Personal history of pulmonary embolism: Secondary | ICD-10-CM | POA: Diagnosis not present

## 2015-04-16 DIAGNOSIS — R1012 Left upper quadrant pain: Secondary | ICD-10-CM | POA: Diagnosis not present

## 2015-04-16 DIAGNOSIS — K828 Other specified diseases of gallbladder: Secondary | ICD-10-CM | POA: Diagnosis not present

## 2015-04-16 DIAGNOSIS — I129 Hypertensive chronic kidney disease with stage 1 through stage 4 chronic kidney disease, or unspecified chronic kidney disease: Secondary | ICD-10-CM | POA: Diagnosis present

## 2015-04-16 DIAGNOSIS — N1832 Chronic kidney disease, stage 3b: Secondary | ICD-10-CM | POA: Diagnosis present

## 2015-04-16 DIAGNOSIS — D631 Anemia in chronic kidney disease: Secondary | ICD-10-CM | POA: Diagnosis present

## 2015-04-16 DIAGNOSIS — I10 Essential (primary) hypertension: Secondary | ICD-10-CM | POA: Diagnosis not present

## 2015-04-16 DIAGNOSIS — R1011 Right upper quadrant pain: Secondary | ICD-10-CM | POA: Diagnosis present

## 2015-04-16 DIAGNOSIS — R109 Unspecified abdominal pain: Secondary | ICD-10-CM

## 2015-04-16 DIAGNOSIS — R1013 Epigastric pain: Secondary | ICD-10-CM

## 2015-04-16 DIAGNOSIS — R079 Chest pain, unspecified: Secondary | ICD-10-CM | POA: Diagnosis not present

## 2015-04-16 DIAGNOSIS — Z9884 Bariatric surgery status: Secondary | ICD-10-CM

## 2015-04-16 DIAGNOSIS — K819 Cholecystitis, unspecified: Secondary | ICD-10-CM | POA: Diagnosis not present

## 2015-04-16 DIAGNOSIS — D649 Anemia, unspecified: Secondary | ICD-10-CM | POA: Diagnosis present

## 2015-04-16 DIAGNOSIS — Z885 Allergy status to narcotic agent status: Secondary | ICD-10-CM | POA: Diagnosis not present

## 2015-04-16 DIAGNOSIS — R1084 Generalized abdominal pain: Secondary | ICD-10-CM | POA: Diagnosis not present

## 2015-04-16 DIAGNOSIS — R110 Nausea: Secondary | ICD-10-CM | POA: Diagnosis not present

## 2015-04-16 DIAGNOSIS — E876 Hypokalemia: Secondary | ICD-10-CM | POA: Diagnosis present

## 2015-04-16 DIAGNOSIS — Z86718 Personal history of other venous thrombosis and embolism: Secondary | ICD-10-CM | POA: Diagnosis not present

## 2015-04-16 DIAGNOSIS — D509 Iron deficiency anemia, unspecified: Secondary | ICD-10-CM | POA: Diagnosis present

## 2015-04-16 HISTORY — DX: Major depressive disorder, single episode, unspecified: F32.9

## 2015-04-16 HISTORY — DX: Other pulmonary embolism without acute cor pulmonale: I26.99

## 2015-04-16 HISTORY — DX: Unspecified chronic bronchitis: J42

## 2015-04-16 HISTORY — DX: Pneumonia, unspecified organism: J18.9

## 2015-04-16 HISTORY — DX: Acute embolism and thrombosis of unspecified deep veins of unspecified lower extremity: I82.409

## 2015-04-16 HISTORY — DX: Chronic sinusitis, unspecified: J32.9

## 2015-04-16 HISTORY — DX: Depression, unspecified: F32.A

## 2015-04-16 HISTORY — DX: Anxiety disorder, unspecified: F41.9

## 2015-04-16 HISTORY — DX: Headache, unspecified: R51.9

## 2015-04-16 HISTORY — DX: Other chronic pain: G89.29

## 2015-04-16 HISTORY — DX: Dorsalgia, unspecified: M54.9

## 2015-04-16 HISTORY — DX: Migraine, unspecified, not intractable, without status migrainosus: G43.909

## 2015-04-16 HISTORY — DX: Headache: R51

## 2015-04-16 HISTORY — DX: Asymptomatic varicose veins of right lower extremity: I83.91

## 2015-04-16 LAB — CBC WITH DIFFERENTIAL/PLATELET
Basophils Absolute: 0 10*3/uL (ref 0.0–0.1)
Basophils Relative: 0 %
EOS ABS: 0.2 10*3/uL (ref 0.0–0.7)
EOS PCT: 3 %
HCT: 37.8 % (ref 36.0–46.0)
Hemoglobin: 12.5 g/dL (ref 12.0–15.0)
LYMPHS ABS: 1.9 10*3/uL (ref 0.7–4.0)
Lymphocytes Relative: 24 %
MCH: 26.3 pg (ref 26.0–34.0)
MCHC: 33.1 g/dL (ref 30.0–36.0)
MCV: 79.4 fL (ref 78.0–100.0)
Monocytes Absolute: 0.4 10*3/uL (ref 0.1–1.0)
Monocytes Relative: 5 %
Neutro Abs: 5.4 10*3/uL (ref 1.7–7.7)
Neutrophils Relative %: 68 %
PLATELETS: 266 10*3/uL (ref 150–400)
RBC: 4.76 MIL/uL (ref 3.87–5.11)
RDW: 15.7 % — ABNORMAL HIGH (ref 11.5–15.5)
WBC: 7.9 10*3/uL (ref 4.0–10.5)

## 2015-04-16 LAB — PROTIME-INR
INR: 1.91 — AB (ref 0.00–1.49)
Prothrombin Time: 21.8 seconds — ABNORMAL HIGH (ref 11.6–15.2)

## 2015-04-16 LAB — COMPREHENSIVE METABOLIC PANEL
ALT: 22 U/L (ref 14–54)
ANION GAP: 10 (ref 5–15)
AST: 27 U/L (ref 15–41)
Albumin: 3.3 g/dL — ABNORMAL LOW (ref 3.5–5.0)
Alkaline Phosphatase: 87 U/L (ref 38–126)
BUN: 18 mg/dL (ref 6–20)
CHLORIDE: 98 mmol/L — AB (ref 101–111)
CO2: 30 mmol/L (ref 22–32)
Calcium: 9.3 mg/dL (ref 8.9–10.3)
Creatinine, Ser: 1.28 mg/dL — ABNORMAL HIGH (ref 0.44–1.00)
GFR, EST AFRICAN AMERICAN: 58 mL/min — AB (ref >60)
GFR, EST NON AFRICAN AMERICAN: 50 mL/min — AB (ref >60)
Glucose, Bld: 120 mg/dL — ABNORMAL HIGH (ref 65–99)
POTASSIUM: 3.4 mmol/L — AB (ref 3.5–5.1)
Sodium: 138 mmol/L (ref 135–145)
Total Bilirubin: 0.6 mg/dL (ref 0.3–1.2)
Total Protein: 7.4 g/dL (ref 6.5–8.1)

## 2015-04-16 LAB — I-STAT TROPONIN, ED: TROPONIN I, POC: 0 ng/mL (ref 0.00–0.08)

## 2015-04-16 LAB — HEPARIN LEVEL (UNFRACTIONATED)
HEPARIN UNFRACTIONATED: 0.56 [IU]/mL (ref 0.30–0.70)
HEPARIN UNFRACTIONATED: 0.89 [IU]/mL — AB (ref 0.30–0.70)

## 2015-04-16 LAB — LIPASE, BLOOD: LIPASE: 38 U/L (ref 11–51)

## 2015-04-16 LAB — LACTIC ACID, PLASMA: LACTIC ACID, VENOUS: 1.2 mmol/L (ref 0.5–2.0)

## 2015-04-16 MED ORDER — GI COCKTAIL ~~LOC~~
30.0000 mL | Freq: Once | ORAL | Status: AC
  Administered 2015-04-16: 30 mL via ORAL
  Filled 2015-04-16: qty 30

## 2015-04-16 MED ORDER — TECHNETIUM TC 99M MEBROFENIN IV KIT
5.0000 | PACK | Freq: Once | INTRAVENOUS | Status: AC | PRN
  Administered 2015-04-16: 5 via INTRAVENOUS

## 2015-04-16 MED ORDER — HEPARIN (PORCINE) IN NACL 100-0.45 UNIT/ML-% IJ SOLN
1600.0000 [IU]/h | INTRAMUSCULAR | Status: DC
  Administered 2015-04-16 (×2): 1850 [IU]/h via INTRAVENOUS
  Filled 2015-04-16 (×4): qty 250

## 2015-04-16 MED ORDER — ACETAMINOPHEN 650 MG RE SUPP: 650.0000 mg | Freq: Four times a day (QID) | RECTAL | Status: DC | PRN

## 2015-04-16 MED ORDER — SUCRALFATE 1 G PO TABS
1.0000 g | ORAL_TABLET | Freq: Once | ORAL | Status: AC
  Administered 2015-04-16: 1 g via ORAL
  Filled 2015-04-16: qty 1

## 2015-04-16 MED ORDER — SODIUM CHLORIDE 0.9 % IV SOLN
INTRAVENOUS | Status: DC
  Administered 2015-04-17 (×2): via INTRAVENOUS

## 2015-04-16 MED ORDER — MORPHINE SULFATE (PF) 4 MG/ML IV SOLN
4.0000 mg | Freq: Once | INTRAVENOUS | Status: AC
  Administered 2015-04-16: 4 mg via INTRAVENOUS
  Filled 2015-04-16: qty 1

## 2015-04-16 MED ORDER — PANTOPRAZOLE SODIUM 40 MG IV SOLR
40.0000 mg | Freq: Once | INTRAVENOUS | Status: AC
  Administered 2015-04-16: 40 mg via INTRAVENOUS
  Filled 2015-04-16: qty 40

## 2015-04-16 MED ORDER — METOCLOPRAMIDE HCL 5 MG/ML IJ SOLN
5.0000 mg | Freq: Once | INTRAMUSCULAR | Status: AC
  Administered 2015-04-16: 5 mg via INTRAVENOUS
  Filled 2015-04-16: qty 2

## 2015-04-16 MED ORDER — MORPHINE SULFATE (PF) 4 MG/ML IV SOLN
6.3000 mg | Freq: Once | INTRAVENOUS | Status: AC
  Administered 2015-04-16: 6.3 mg via INTRAVENOUS

## 2015-04-16 MED ORDER — SODIUM CHLORIDE 0.9 % IV BOLUS (SEPSIS)
1000.0000 mL | Freq: Once | INTRAVENOUS | Status: AC
  Administered 2015-04-16: 1000 mL via INTRAVENOUS

## 2015-04-16 MED ORDER — DEXTROSE 5 % IV SOLN
1.0000 g | INTRAVENOUS | Status: DC
  Administered 2015-04-16: 1 g via INTRAVENOUS
  Filled 2015-04-16: qty 10

## 2015-04-16 MED ORDER — IOHEXOL 350 MG/ML SOLN
100.0000 mL | Freq: Once | INTRAVENOUS | Status: AC | PRN
  Administered 2015-04-16: 100 mL via INTRAVENOUS

## 2015-04-16 MED ORDER — HYDROCODONE-ACETAMINOPHEN 5-325 MG PO TABS: 1.0000 | ORAL_TABLET | ORAL | Status: DC | PRN

## 2015-04-16 MED ORDER — KETOROLAC TROMETHAMINE 30 MG/ML IJ SOLN
30.0000 mg | Freq: Once | INTRAMUSCULAR | Status: AC
  Administered 2015-04-16: 30 mg via INTRAVENOUS
  Filled 2015-04-16: qty 1

## 2015-04-16 MED ORDER — DEXTROSE 5 % IV SOLN
1.0000 g | INTRAVENOUS | Status: DC
  Administered 2015-04-17: 1 g via INTRAVENOUS
  Filled 2015-04-16: qty 10

## 2015-04-16 MED ORDER — MORPHINE SULFATE (PF) 4 MG/ML IV SOLN
INTRAVENOUS | Status: AC
  Filled 2015-04-16: qty 2

## 2015-04-16 MED ORDER — ONDANSETRON HCL 4 MG/2ML IJ SOLN
4.0000 mg | Freq: Once | INTRAMUSCULAR | Status: AC
  Administered 2015-04-16: 4 mg via INTRAVENOUS
  Filled 2015-04-16: qty 2

## 2015-04-16 MED ORDER — ACETAMINOPHEN 325 MG PO TABS
650.0000 mg | ORAL_TABLET | Freq: Four times a day (QID) | ORAL | Status: DC | PRN
  Administered 2015-04-16 – 2015-04-17 (×2): 650 mg via ORAL
  Filled 2015-04-16 (×2): qty 2

## 2015-04-16 MED ORDER — ONDANSETRON HCL 4 MG/2ML IJ SOLN
4.0000 mg | INTRAMUSCULAR | Status: DC | PRN
  Administered 2015-04-17 – 2015-04-20 (×4): 4 mg via INTRAVENOUS
  Filled 2015-04-16 (×4): qty 2

## 2015-04-16 MED ORDER — PANTOPRAZOLE SODIUM 40 MG IV SOLR
40.0000 mg | Freq: Two times a day (BID) | INTRAVENOUS | Status: DC
  Administered 2015-04-16 – 2015-04-21 (×10): 40 mg via INTRAVENOUS
  Filled 2015-04-16 (×10): qty 40

## 2015-04-16 MED ORDER — DIPHENHYDRAMINE HCL 50 MG/ML IJ SOLN
25.0000 mg | Freq: Once | INTRAMUSCULAR | Status: AC
  Administered 2015-04-16: 25 mg via INTRAVENOUS
  Filled 2015-04-16: qty 1

## 2015-04-16 MED ORDER — HYDRALAZINE HCL 20 MG/ML IJ SOLN
10.0000 mg | Freq: Four times a day (QID) | INTRAMUSCULAR | Status: DC | PRN
Start: 2015-04-16 — End: 2015-04-17

## 2015-04-16 NOTE — ED Notes (Signed)
Patient transported to Ultrasound 

## 2015-04-16 NOTE — Progress Notes (Signed)
ANTICOAGULATION CONSULT NOTE - Initial Consult  Pharmacy Consult for heparin Indication: DVT/chest pain  Allergies  Allergen Reactions  . Oxycodone Hcl Nausea Only    Patient Measurements:   Heparin Dosing Weight: 103.6 kg  Vital Signs: BP: 150/86 mmHg (12/01 0831) Pulse Rate: 84 (12/01 0831)  Labs:  Recent Labs  04/16/15 0520  HGB 12.5  HCT 37.8  PLT 266  LABPROT 21.8*  INR 1.91*  CREATININE 1.28*    CrCl cannot be calculated (Unknown ideal weight.).   Medical History: Past Medical History  Diagnosis Date  . Hypertension   . History of blood clots   . Varicose vein   . Anemia   . Sleep apnea   . GERD (gastroesophageal reflux disease)   . Obesity     Medications:   (Not in a hospital admission)  Assessment: 44 yo female presenting with complaints of chest pain since last night. Pt has been seen for similar pain a few weeks ago and was told it was GERD. 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 She is currently on warfarin with an INR of 1.91, Hgb 12.5, Plt 266.  EKG normal, trop negative. Abd ultrasound showing enlarged gallbladder w/ stone, likely cause of current symptoms.  Goal of Therapy:  INR 2-3 Heparin level 0.3-0.7 units/ml Monitor platelets by anticoagulation protocol: Yes   Plan:  - Start heparin infusion at 1850 units/hr - Check anti-Xa level in 6 hours and daily while on heparin - Continue to monitor H&H and platelets - Follow-up resumption of warfarin once pt is taking POs; will need bridged with heparin d/t active DVT  Governor Specking, PharmD Clinical Pharmacy Resident Pager: [phone] 04/16/2015,10:15 AM

## 2015-04-16 NOTE — ED Provider Notes (Signed)
CSN: [id number]     Arrival date & time 04/16/15  0457 History   First MD Initiated Contact with Patient 04/16/15 [phone]     Chief Complaint  Patient presents with  . Chest Pain     (Consider location/radiation/quality/duration/timing/severity/associated sxs/prior Treatment) HPI  This is a 44 year old female with a history of hypertension, PE who presents with chest pain. Patient reports onset of severe chest pain over the mid chest. She ate a [place] sandwich and went to bed.  Had onset of pain at 9 PM last night. Currently rates her pain 10 out of 10. The pain is worse with laying flat. She has a history of GERD and has had similar pain over the last several months. Patient states that she took ibuprofen tonight and it made it worse. She is currently on Coumadin for active DVT and PE. She has belching and nauseous. Denies abdominal pain.  She is currently sitting upright in a chair because she feels that that makes her pain better.  Patient was seen and evaluated one month ago for similar symptoms. Had a CT scan at that time which showed an enlarged gallbladder but was otherwise reassuring.  Past Medical History  Diagnosis Date  . Hypertension   . History of blood clots   . Varicose vein   . Anemia   . Sleep apnea   . GERD (gastroesophageal reflux disease)   . Obesity    Past Surgical History  Procedure Laterality Date  . Leg surgery      Right leg  . Gastric bypass      52yrs ago    Family History  Problem Relation Age of Onset  . Kidney disease Mother     kidney transplant   Social History  Substance Use Topics  . Smoking status: Never Smoker   . Smokeless tobacco: Never Used  . Alcohol Use: Yes     Comment: occassionally   OB History    Gravida Para Term Preterm AB TAB SAB Ectopic Multiple Living   0 0 0 0 0 0 0 0 0 0      Review of Systems  Constitutional: Negative for fever.  Respiratory: Negative for cough, chest tightness and shortness of breath.    Cardiovascular: Positive for chest pain.  Gastrointestinal: Positive for nausea. Negative for vomiting and abdominal pain.       Belching  Genitourinary: Negative for dysuria.  Musculoskeletal: Negative for back pain.  Neurological: Negative for headaches.  All other systems reviewed and are negative.     Allergies  Oxycodone hcl  Home Medications   Prior to Admission medications   Medication Sig Start Date End Date Taking? Authorizing Provider  amLODipine (NORVASC) 10 MG tablet Take 1 tablet (10 mg total) by mouth daily. 05/20/13  Yes [name], MD  hydrochlorothiazide (HYDRODIURIL) 25 MG tablet Take 25 mg by mouth daily.   Yes Historical Provider, MD  warfarin (COUMADIN) 2.5 MG tablet Take 5 tablets (12.5 mg total) by mouth one time only at 6 PM. Take daily until INR checked and then per PCP. Patient taking differently: Take 10 mg by mouth daily.  05/20/13  Yes [name], MD  docusate sodium (COLACE) 100 MG capsule Take 1 capsule (100 mg total) by mouth 2 (two) times daily. Patient not taking: Reported on 04/05/2014 11/25/13   Allen Norris, MD  HYDROcodone-acetaminophen (NORCO/VICODIN) 5-325 MG tablet Take 1-2 tablets by mouth every 4 (four) hours as needed for moderate pain or severe pain.  Patient not taking: Reported on 04/16/2015 03/01/15   Clayton Bibles, PA-C  metoCLOPramide (REGLAN) 10 MG tablet Take 1 tablet (10 mg total) by mouth every 6 (six) hours as needed for nausea or vomiting. Patient not taking: Reported on 04/16/2015 03/01/15   Clayton Bibles, PA-C  metroNIDAZOLE (FLAGYL) 500 MG tablet Take 4 tablets (2,000 mg total) by mouth once. Patient not taking: Reported on 04/05/2014 11/25/13   Allen Norris, MD  pantoprazole (PROTONIX) 20 MG tablet Take 1 tablet (20 mg total) by mouth daily. Patient not taking: Reported on 04/16/2015 03/01/15   Clayton Bibles, PA-C   BP 159/91 mmHg  Pulse 82  Resp 13  SpO2 95% Physical Exam  Constitutional: She is oriented to person,  place, and time.  Uncomfortable appearing, obese  HENT:  Head: Normocephalic and atraumatic.  Eyes: Pupils are equal, round, and reactive to light.  Neck: Neck supple.  Cardiovascular: Normal rate, regular rhythm and normal heart sounds.   No murmur heard. Pulmonary/Chest: Effort normal and breath sounds normal. No respiratory distress. She has no wheezes.  Abdominal: Soft. Bowel sounds are normal. There is tenderness. There is no rebound and no guarding.  Tenderness to palpation of the epigastrium  Neurological: She is alert and oriented to person, place, and time.  Skin: Skin is warm and dry.  Psychiatric: She has a normal mood and affect.  Nursing note and vitals reviewed.   ED Course  Procedures (including critical care time) Labs Review Labs Reviewed  CBC WITH DIFFERENTIAL/PLATELET - Abnormal; Notable for the following:    RDW 15.7 (*)    All other components within normal limits  COMPREHENSIVE METABOLIC PANEL - Abnormal; Notable for the following:    Potassium 3.4 (*)    Chloride 98 (*)    Glucose, Bld 120 (*)    Creatinine, Ser 1.28 (*)    Albumin 3.3 (*)    GFR calc non Af Amer 50 (*)    GFR calc Af Amer 58 (*)    All other components within normal limits  PROTIME-INR - Abnormal; Notable for the following:    Prothrombin Time 21.8 (*)    INR 1.91 (*)    All other components within normal limits  LIPASE, BLOOD  I-STAT TROPOININ, ED    Imaging Review Dg Chest 2 View  04/16/2015  CLINICAL DATA:  Chest pain and nausea, onset tonight. EXAM: CHEST  2 VIEW COMPARISON:  03/01/2015 FINDINGS: The heart size and mediastinal contours are within normal limits. Both lungs are clear. The visualized skeletal structures are unremarkable. IMPRESSION: No active cardiopulmonary disease. Electronically Signed   By: Andreas Newport M.D.   On: 04/16/2015 06:22   I have personally reviewed and evaluated these images and lab results as part of my medical decision-making.   EKG  Interpretation   Date/Time:  Thursday April 16 2015 05:06:55 EST Ventricular Rate:  86 PR Interval:  152 QRS Duration: 91 QT Interval:  389 QTC Calculation: 465 R Axis:   21 Text Interpretation:  Sinus rhythm Abnormal R-wave progression, early  transition Confirmed by HORTON  MD, COURTNEY ([id number]) on 04/16/2015 6:08:41  AM      MDM   Final diagnoses:  Epigastric pain    Patient presents complaining of chest pain. On exam it appears to be more epigastric in nature. She has reproducible tenderness on exam. She has been seen before with similar symptoms. She is uncomfortable appearing. She was initially given Protonix, Zofran, and a GI cocktail. Basic labwork obtained  and largely reassuring. INR is subtherapeutic at 1.91. Chest x-ray is negative. EKG is nonischemic and troponin is negative. Suspect GI etiology. GERD versus symptomatic gallbladder disease. Patient initially vomited GI cocktail.  6:53 AM On recheck, patient continues to endorse pain after morphine. She is willing to try the GI cocktail again. She was subsequently also given Reglan and Benadryl. Will obtain a right upper quadrant ultrasound. This may be limited secondary to body habitus but may give [place] a better view of the gallbladder.  Signed out to oncoming physician.    Merryl Hacker, MD 04/16/15 [phone]

## 2015-04-16 NOTE — Progress Notes (Signed)
ANTICOAGULATION CONSULT NOTE - Follow Up Consult  Pharmacy Consult for heparin Indication: DVT/CP  Allergies  Allergen Reactions  . Oxycodone Hcl Nausea Only    Patient Measurements: Height: 5\' 8"  (172.7 cm) (03/01/2015) Weight: (!) 350 lb 1.5 oz (158.8 kg) (03/01/2015) IBW/kg (Calculated) : 63.9 Heparin Dosing Weight: 103.6 kg  Vital Signs: BP: 160/105 mmHg (12/01 1230) Pulse Rate: 80 (12/01 1230)  Labs:  Recent Labs  04/16/15 0520 04/16/15 1520  HGB 12.5  --   HCT 37.8  --   PLT 266  --   LABPROT 21.8*  --   INR 1.91*  --   HEPARINUNFRC  --  0.56  CREATININE 1.28*  --     Estimated Creatinine Clearance: 91.2 mL/min (by C-G formula based on Cr of 1.28).   Medications:  Scheduled:  . [START ON 04/17/2015] cefTRIAXone (ROCEPHIN)  IV  1 g Intravenous Q24H  . pantoprazole (PROTONIX) IV  40 mg Intravenous Q12H   Infusions:  . sodium chloride Stopped (04/16/15 1040)  . heparin 1,850 Units/hr (04/16/15 1037)    Assessment: 44 yo female with DVT / chest pain is currently on therapeutic heparin.  Heparin level is 0.56.  Goal of Therapy:  Heparin level 0.3-0.7 units/ml Monitor platelets by anticoagulation protocol: Yes   Plan:  - continue heparin at 1850 units/hr - repeat in 6 hours heparin level to confirm  Loveta Dellis, Tsz-Yin 04/16/2015,4:22 PM

## 2015-04-16 NOTE — Consult Note (Signed)
Reason for Consult:Abd pain Referring Physician: Peja [name] is an 44 y.o. female.  HPI: Alice [name] returned to the ED last night with severe LUQ pain. She ate a [place] sandwich and has some EtOH about 20 minutes prior to onset. The pain is severe and unrelenting with radiation to the left back and occasionally to the mid-chest. She had some nausea and dry heaves last night but no emesis. She was in the ED about a month ago with the same presentation and was diagnosed with GERD. She has continued to have persistent back pain since then that her medication has not helped. Prior to that she was asymptomatic.   Past Medical History  Diagnosis Date  . Hypertension   . History of blood clots   . Varicose vein   . Anemia   . Sleep apnea   . GERD (gastroesophageal reflux disease)   . Obesity     Past Surgical History  Procedure Laterality Date  . Leg surgery      Right leg  . Gastric bypass      64yr ago     Family History  Problem Relation Age of Onset  . Kidney disease Mother     kidney transplant    Social History:  reports that she has never smoked. She has never used smokeless tobacco. She reports that she drinks alcohol. She reports that she does not use illicit drugs.  Allergies:  Allergies  Allergen Reactions  . Oxycodone Hcl Nausea Only   Medications: I have reviewed the patient's current medications.  Results for orders placed or performed during the hospital encounter of 04/16/15 (from the past 48 hour(s))  CBC with Differential     Status: Abnormal   Collection Time: 04/16/15  5:20 AM  Result Value Ref Range   WBC 7.9 4.0 - 10.5 K/uL   RBC 4.76 3.87 - 5.11 MIL/uL   Hemoglobin 12.5 12.0 - 15.0 g/dL   HCT 37.8 36.0 - 46.0 %   MCV 79.4 78.0 - 100.0 fL   MCH 26.3 26.0 - 34.0 pg   MCHC 33.1 30.0 - 36.0 g/dL   RDW 15.7 (H) 11.5 - 15.5 %   Platelets 266 150 - 400 K/uL   Neutrophils Relative % 68 %   Neutro Abs 5.4 1.7 - 7.7 K/uL   Lymphocytes Relative 24  %   Lymphs Abs 1.9 0.7 - 4.0 K/uL   Monocytes Relative 5 %   Monocytes Absolute 0.4 0.1 - 1.0 K/uL   Eosinophils Relative 3 %   Eosinophils Absolute 0.2 0.0 - 0.7 K/uL   Basophils Relative 0 %   Basophils Absolute 0.0 0.0 - 0.1 K/uL  Lipase, blood     Status: None   Collection Time: 04/16/15  5:20 AM  Result Value Ref Range   Lipase 38 11 - 51 U/L  Comprehensive metabolic panel     Status: Abnormal   Collection Time: 04/16/15  5:20 AM  Result Value Ref Range   Sodium 138 135 - 145 mmol/L   Potassium 3.4 (L) 3.5 - 5.1 mmol/L   Chloride 98 (L) 101 - 111 mmol/L   CO2 30 22 - 32 mmol/L   Glucose, Bld 120 (H) 65 - 99 mg/dL   BUN 18 6 - 20 mg/dL   Creatinine, Ser 1.28 (H) 0.44 - 1.00 mg/dL   Calcium 9.3 8.9 - 10.3 mg/dL   Total Protein 7.4 6.5 - 8.1 g/dL   Albumin 3.3 (L) 3.5 - 5.0  g/dL   AST 27 15 - 41 U/L   ALT 22 14 - 54 U/L   Alkaline Phosphatase 87 38 - 126 U/L   Total Bilirubin 0.6 0.3 - 1.2 mg/dL   GFR calc non Af Amer 50 (L) >60 mL/min   GFR calc Af Amer 58 (L) >60 mL/min    Comment: (NOTE) The eGFR has been calculated using the CKD EPI equation. This calculation has not been validated in all clinical situations. eGFR's persistently <60 mL/min signify possible Chronic Kidney Disease.    Anion gap 10 5 - 15  Protime-INR     Status: Abnormal   Collection Time: 04/16/15  5:20 AM  Result Value Ref Range   Prothrombin Time 21.8 (H) 11.6 - 15.2 seconds   INR 1.91 (H) 0.00 - 1.49  I-Stat Troponin, ED (not at [facility])     Status: None   Collection Time: 04/16/15  5:33 AM  Result Value Ref Range   Troponin i, poc 0.00 0.00 - 0.08 ng/mL   Comment 3            Comment: Due to the release kinetics of cTnI, a negative result within the first hours of the onset of symptoms does not rule out myocardial infarction with certainty. If myocardial infarction is still suspected, repeat the test at appropriate intervals.     Dg Chest 2 View  04/16/2015  CLINICAL DATA:  Chest pain  and nausea, onset tonight. EXAM: CHEST  2 VIEW COMPARISON:  03/01/2015 FINDINGS: The heart size and mediastinal contours are within normal limits. Both lungs are clear. The visualized skeletal structures are unremarkable. IMPRESSION: No active cardiopulmonary disease. Electronically Signed   By: Andreas Newport M.D.   On: 04/16/2015 06:22   US Abdomen Limited Ruq  04/16/2015  CLINICAL DATA:  Epigastric pain, history of gastric bypass, hypertension, obesity EXAM: US ABDOMEN LIMITED - RIGHT UPPER QUADRANT COMPARISON:  CT abdomen pelvis 03/01/2015 FINDINGS: Gallbladder: Large shadowing echogenic focus within gallbladder 11.2 cm length most consistent with cholelithiasis. The presence of shadowing makes this less likely to represent a large sludge ball. Gallbladder is distended, 12.9 cm length. No gallbladder wall thickening or pericholecystic fluid. Equivocal sonographic Murphy sign. Common bile duct: Diameter: 8 mm mildly dilated for age Liver: Normal appearance Image quality degraded secondary to body habitus. No RIGHT upper quadrant free fluid. IMPRESSION: Distended gallbladder containing an 11.2 mm diameter shadowing focus most likely representing cholelithiasis. Equivocal sonographic Percell Miller sign without evidence of gallbladder wall thickening. Mild biliary dilatation for age; recommend correlation with LFTs. Electronically Signed   By: Lavonia Dana M.D.   On: 04/16/2015 08:24    Review of Systems  Constitutional: Negative for fever and chills.  Respiratory: Negative for cough, hemoptysis and shortness of breath.   Cardiovascular: Positive for chest pain. Negative for palpitations.  Gastrointestinal: Positive for heartburn, nausea, vomiting and abdominal pain. Negative for diarrhea, constipation, blood in stool and melena.  Genitourinary: Negative for dysuria, urgency, frequency and hematuria.  Musculoskeletal: Positive for back pain.  Neurological: Negative for dizziness.  All other systems reviewed  and are negative.  Blood pressure 162/107, pulse 90, resp. rate 19, height _0  (1.727 m), weight 158.8 kg (350 lb 1.5 oz), SpO2 96 %. Physical Exam  Constitutional: She appears well-developed and well-nourished. She appears distressed.  HENT:  Head: Normocephalic and atraumatic.  Right Ear: External ear normal.  Left Ear: External ear normal.  Nose: Nose normal.  Mouth/Throat: Oropharynx is clear and moist. No oropharyngeal  exudate.  Eyes: Conjunctivae and EOM are normal. Right eye exhibits no discharge. Left eye exhibits no discharge. No scleral icterus.  Neck: Normal range of motion. Neck supple.  Cardiovascular: Normal rate, regular rhythm and normal heart sounds.  Exam reveals no gallop and no friction rub.   No murmur heard. Respiratory: Effort normal and breath sounds normal.  GI: Soft. Bowel sounds are normal. She exhibits no distension. There is tenderness in the left upper quadrant. There is no rigidity, no rebound, no guarding and negative Murphy's sign.  Musculoskeletal: Normal range of motion.  Lymphadenopathy:    She has no cervical adenopathy.  Neurological: She is alert.  Skin: Skin is warm and dry. She is not diaphoretic.  Psychiatric: Her speech is normal. Her mood appears anxious. She is agitated.    Assessment/Plan: Abd pain -- I do not think this is GB disease though I agree with HIDA. Given hx/o DVT/PE and subtherapeutic coumadin I would favor mesenteric ischemia or, less likely, splenic infarction. Will order CT angio. Agree with IM admission given uncontrolled HTN and anticoagulation. Recommend heparin gtts and cessation of coumadin but no reversal agents. Recommend GI consult.  Thank you for the consult. We will follow along with you.    Lisette Abu, PA-C Pager: [phone] 04/16/2015, 10:45 AM

## 2015-04-16 NOTE — ED Provider Notes (Signed)
Handoff received from Dr. Dina Rich at [facility] with plan for f/u US of RUQ.   Results:  BP 150/86 mmHg  Pulse 84  Resp 21  SpO2 97%  Results for orders placed or performed during the hospital encounter of 04/16/15  CBC with Differential  Result Value Ref Range   WBC 7.9 4.0 - 10.5 K/uL   RBC 4.76 3.87 - 5.11 MIL/uL   Hemoglobin 12.5 12.0 - 15.0 g/dL   HCT 37.8 36.0 - 46.0 %   MCV 79.4 78.0 - 100.0 fL   MCH 26.3 26.0 - 34.0 pg   MCHC 33.1 30.0 - 36.0 g/dL   RDW 15.7 (H) 11.5 - 15.5 %   Platelets 266 150 - 400 K/uL   Neutrophils Relative % 68 %   Neutro Abs 5.4 1.7 - 7.7 K/uL   Lymphocytes Relative 24 %   Lymphs Abs 1.9 0.7 - 4.0 K/uL   Monocytes Relative 5 %   Monocytes Absolute 0.4 0.1 - 1.0 K/uL   Eosinophils Relative 3 %   Eosinophils Absolute 0.2 0.0 - 0.7 K/uL   Basophils Relative 0 %   Basophils Absolute 0.0 0.0 - 0.1 K/uL  Lipase, blood  Result Value Ref Range   Lipase 38 11 - 51 U/L  Comprehensive metabolic panel  Result Value Ref Range   Sodium 138 135 - 145 mmol/L   Potassium 3.4 (L) 3.5 - 5.1 mmol/L   Chloride 98 (L) 101 - 111 mmol/L   CO2 30 22 - 32 mmol/L   Glucose, Bld 120 (H) 65 - 99 mg/dL   BUN 18 6 - 20 mg/dL   Creatinine, Ser 1.28 (H) 0.44 - 1.00 mg/dL   Calcium 9.3 8.9 - 10.3 mg/dL   Total Protein 7.4 6.5 - 8.1 g/dL   Albumin 3.3 (L) 3.5 - 5.0 g/dL   AST 27 15 - 41 U/L   ALT 22 14 - 54 U/L   Alkaline Phosphatase 87 38 - 126 U/L   Total Bilirubin 0.6 0.3 - 1.2 mg/dL   GFR calc non Af Amer 50 (L) >60 mL/min   GFR calc Af Amer 58 (L) >60 mL/min   Anion gap 10 5 - 15  Protime-INR  Result Value Ref Range   Prothrombin Time 21.8 (H) 11.6 - 15.2 seconds   INR 1.91 (H) 0.00 - 1.49  I-Stat Troponin, ED (not at [facility])  Result Value Ref Range   Troponin i, poc 0.00 0.00 - 0.08 ng/mL   Comment 3            Dg Chest 2 View  04/16/2015  CLINICAL DATA:  Chest pain and nausea, onset tonight. EXAM: CHEST  2 VIEW COMPARISON:  03/01/2015 FINDINGS: The heart size  and mediastinal contours are within normal limits. Both lungs are clear. The visualized skeletal structures are unremarkable. IMPRESSION: No active cardiopulmonary disease. Electronically Signed   By: Andreas Newport M.D.   On: 04/16/2015 06:22   US Abdomen Limited Ruq  04/16/2015  CLINICAL DATA:  Epigastric pain, history of gastric bypass, hypertension, obesity EXAM: US ABDOMEN LIMITED - RIGHT UPPER QUADRANT COMPARISON:  CT abdomen pelvis 03/01/2015 FINDINGS: Gallbladder: Large shadowing echogenic focus within gallbladder 11.2 cm length most consistent with cholelithiasis. The presence of shadowing makes this less likely to represent a large sludge ball. Gallbladder is distended, 12.9 cm length. No gallbladder wall thickening or pericholecystic fluid. Equivocal sonographic Murphy sign. Common bile duct: Diameter: 8 mm mildly dilated for age Liver: Normal appearance Image quality degraded  secondary to body habitus. No RIGHT upper quadrant free fluid. IMPRESSION: Distended gallbladder containing an 11.2 mm diameter shadowing focus most likely representing cholelithiasis. Equivocal sonographic Percell Miller sign without evidence of gallbladder wall thickening. Mild biliary dilatation for age; recommend correlation with LFTs. Electronically Signed   By: Lavonia Dana M.D.   On: 04/16/2015 08:24    Diagnoses that have been ruled out:  None  Diagnoses that are still under consideration:  None  Final diagnoses:  Epigastric pain  Symptomatic cholelithiasis   Ultrasound demonstrates enlarged gallbladder with very large stone or sludge ball. Given the patient's symptoms this likely represents etiology of her pain. No gallbladder wall thickening, pericholecystic fluid, transaminitis, fevers, leukocytosis or other signs of acute cholecystitis, ascending cholangitis, choledocholithiasis.  Pain difficult to control with multiple rounds of parenteral narcotic and nonnarcotic pain medication, continuing to have symptoms,  surgery was consulted for recommendations and they wish for patient to undergo HIDA scan. Due to medical complexity hospitalist was consulted for admission to perform HIDA scan and continue pain control for symptomatic cholelithiasis.  Leo Grosser, MD 04/16/15 [phone]

## 2015-04-16 NOTE — ED Notes (Signed)
Gave patient GI cocktail and upon drinking it, immediately threw it back up because of the taste.

## 2015-04-16 NOTE — ED Notes (Signed)
Dr. Horton at bedside. 

## 2015-04-16 NOTE — ED Notes (Signed)
Pt up to bedside commode

## 2015-04-16 NOTE — ED Notes (Signed)
Report given to RN on 6N

## 2015-04-16 NOTE — H&P (Signed)
[facility] History and Physical                                                                                    Alice [name], is a 44 y.o. female  MRN: [id number]   DOB - 10-Oct-1970  Admit Date - 04/16/2015  Outpatient Primary MD for the patient is Benito Mccreedy, MD  Referring MD: Laneta Simmers / ER  Consulting M.D: Ramonita Lab surgery  With History of -  Past Medical History  Diagnosis Date  . Hypertension   . History of blood clots   . Varicose vein   . Anemia   . Sleep apnea   . GERD (gastroesophageal reflux disease)   . Obesity       Past Surgical History  Procedure Laterality Date  . Leg surgery      Right leg  . Gastric bypass      73yrs ago     in for   Chief Complaint  Patient presents with  . 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 Patient was recently seen in the ER on 10/16 for the same problem and workup was unrevealing at that time. Patient reports recurrent episodes of this similar type discomfort nearly daily and appears to be worse with eating. Because of the symptoms patient has been taking over-the-counter ibuprofen 3 tabs every 4 hours. She states that one symptoms began they typically last for several hours usually 10-12 hours and then will resolve. He has not had any nausea. She reports loose stool since onset of symptoms. She reports the stools are dark in nature. She had dry heaves just today. She's not had any reflux symptoms. She is not having shortness of breath dyspnea on exertion or orthopnea. Symptoms began yesterday evening and have been ongoing for greater than 12 hours. He calls pain was unrelenting she presented to the ER for further evaluation.  In the ER patient was afebrile, moderately hypertensive  in the setting of pain with BP 157/104, pulse was 82, respirations 13 with sats 96%. DT the abdomen and pelvis that had been completed on 10/16 revealed gallbladder distention out obvious inflammatory changes. Because patient was initially complaining of left lower chest pain radiating to the back there was some concern she may have either been experiencing cardiac ischemic or pulmonary embolism type symptoms. EKG was unremarkable as was troponin. Patient was not hypoxic. Labs unremarkable except for mild hypokalemia and mild renal insufficiency but within baseline for patient according to GFR. LFTs are normal. Thus patient had unrelenting abdominal pain that appeared consistent with biliary colic and ultrasound of the abdomen was obtained and this revealed a distended gallbladder containing and 11.2 mm diameter shadowing focus most likely representing a large gallstone she also had the equivocal sonographic Murphy sign without evidence of gallbladder wall thickening. He was also mild biliary dilatation for age. General surgery was consulted and given patient's history of DVT and other medical problems they deferred admission  to the internal medicine team but will see the patient in consultation. While in the ER patient has received GI cocktail, Zofran, Protonix, morphine, Carafate, Reglan, Benadryl, and Toradol without any significant improvement in her pain. Because she presented with subtherapeutic INR IV heparin was initiated.   Review of Systems   In addition to the HPI above,  No Fever-chills, myalgias or other constitutional symptoms No Headache, changes with Vision or hearing, new weakness, tingling, numbness in any extremity, No problems swallowing food or Liquids, indigestion/reflux No Cough or Shortness of Breath, palpitations, orthopnea or DOE No hematochezia, no dark tarry stools, Bowel movements are regular, No dysuria, hematuria or flank pain No new skin rashes, lesions, masses or  bruises, No new joints pains-aches No recent weight gain or loss No polyuria, polydypsia or polyphagia,  *A full 10 point Review of Systems was done, except as stated above, all other Review of Systems were negative.  Social History Social History  Substance Use Topics  . Smoking status: Never Smoker   . Smokeless tobacco: Never Used  . Alcohol Use: Yes     Comment: occassionally    Resides at: Private residence  Lives with: Alone  Ambulatory status: Without assistive devices   Family History Family History  Problem Relation Age of Onset  . Kidney disease Mother     kidney transplant     Prior to Admission medications   Medication Sig Start Date End Date Taking? Authorizing Provider  amLODipine (NORVASC) 10 MG tablet Take 1 tablet (10 mg total) by mouth daily. 05/20/13  Yes [name], MD  hydrochlorothiazide (HYDRODIURIL) 25 MG tablet Take 25 mg by mouth daily.   Yes Historical Provider, MD  warfarin (COUMADIN) 2.5 MG tablet Take 5 tablets (12.5 mg total) by mouth one time only at 6 PM. Take daily until INR checked and then per PCP. Patient taking differently: Take 10 mg by mouth daily.  05/20/13  Yes [name], MD  docusate sodium (COLACE) 100 MG capsule Take 1 capsule (100 mg total) by mouth 2 (two) times daily. Patient not taking: Reported on 04/05/2014 11/25/13   Allen Norris, MD  metoCLOPramide (REGLAN) 10 MG tablet Take 1 tablet (10 mg total) by mouth every 6 (six) hours as needed for nausea or vomiting. Patient not taking: Reported on 04/16/2015 03/01/15   Clayton Bibles, PA-C  metroNIDAZOLE (FLAGYL) 500 MG tablet Take 4 tablets (2,000 mg total) by mouth once. Patient not taking: Reported on 04/05/2014 11/25/13   Allen Norris, MD  pantoprazole (PROTONIX) 20 MG tablet Take 1 tablet (20 mg total) by mouth daily. Patient not taking: Reported on 04/16/2015 03/01/15   Clayton Bibles, PA-C    Allergies  Allergen Reactions  . Oxycodone Hcl Nausea Only     Physical Exam  Vitals  Blood pressure 162/107, pulse 90, resp. rate 19, height 5\' 8"  (1.727 m), weight 350 lb 1.5 oz (158.8 kg), SpO2 96 %.   General:  In acute distress as evidenced by ongoing abdominal pain which patient describes as unrelenting  Psych:  Normal affect but quite anxious secondary to pain, Denies Suicidal or Homicidal ideations, Awake Alert, Oriented X 3. Speech and thought patterns are clear and appropriate, no apparent short term memory deficits  Neuro:   No focal neurological deficits, CN II through XII intact, Strength 5/5 all 4 extremities, Sensation intact all 4 extremities.  ENT:  Ears and Eyes appear Normal, Conjunctivae clear, PER. Moist oral mucosa without erythema or exudates.  Neck:  Supple, No lymphadenopathy appreciated  Respiratory:  Symmetrical chest wall movement, Good air movement bilaterally, CTAB. Room Air  Cardiac:  RRR, No Murmurs, no LE edema noted, no JVD, No carotid bruits, peripheral pulses palpable at 2+  Abdomen:  Positive prolactin bowel sounds, Soft, tender to palpation bilateral upper quadrants, Non distended,  No masses appreciated, no obvious hepatosplenomegaly difficult to ascertain secondary to patient's morbid obesity and large abdominal girth  Skin:  No Cyanosis, Normal Skin Turgor, No Skin Rash or Bruise.  Extremities: Symmetrical without obvious trauma or injury,  no effusions.  Data Review  CBC  Recent Labs Lab 04/16/15 0520  WBC 7.9  HGB 12.5  HCT 37.8  PLT 266  MCV 79.4  MCH 26.3  MCHC 33.1  RDW 15.7*  LYMPHSABS 1.9  MONOABS 0.4  EOSABS 0.2  BASOSABS 0.0    Chemistries   Recent Labs Lab 04/16/15 0520  NA 138  K 3.4*  CL 98*  CO2 30  GLUCOSE 120*  BUN 18  CREATININE 1.28*  CALCIUM 9.3  AST 27  ALT 22  ALKPHOS 87  BILITOT 0.6    estimated creatinine clearance is 91.2 mL/min (by C-G formula based on Cr of 1.28).  No results for input(s): TSH, T4TOTAL, T3FREE, THYROIDAB in the last 72  hours.  Invalid input(s): FREET3  Coagulation profile  Recent Labs Lab 04/16/15 0520  INR 1.91*    No results for input(s): DDIMER in the last 72 hours.  Cardiac Enzymes No results for input(s): CKMB, TROPONINI, MYOGLOBIN in the last 168 hours.  Invalid input(s): CK  Invalid input(s): POCBNP  Urinalysis    Component Value Date/Time   COLORURINE YELLOW 11/25/2013 1511   APPEARANCEUR HAZY* 11/25/2013 1511   LABSPEC 1.015 11/25/2013 1511   PHURINE 6.0 11/25/2013 1511   GLUCOSEU NEGATIVE 11/25/2013 1511   HGBUR LARGE* 11/25/2013 1511   BILIRUBINUR NEGATIVE 11/25/2013 1511   KETONESUR NEGATIVE 11/25/2013 1511   PROTEINUR 30* 11/25/2013 1511   UROBILINOGEN 4.0* 11/25/2013 1511   NITRITE NEGATIVE 11/25/2013 1511   LEUKOCYTESUR MODERATE* 11/25/2013 1511    Imaging results:   Dg Chest 2 View  04/16/2015  CLINICAL DATA:  Chest pain and nausea, onset tonight. EXAM: CHEST  2 VIEW COMPARISON:  03/01/2015 FINDINGS: The heart size and mediastinal contours are within normal limits. Both lungs are clear. The visualized skeletal structures are unremarkable. IMPRESSION: No active cardiopulmonary disease. Electronically Signed   By: Andreas Newport M.D.   On: 04/16/2015 06:22   US Abdomen Limited Ruq  04/16/2015  CLINICAL DATA:  Epigastric pain, history of gastric bypass, hypertension, obesity EXAM: US ABDOMEN LIMITED - RIGHT UPPER QUADRANT COMPARISON:  CT abdomen pelvis 03/01/2015 FINDINGS: Gallbladder: Large shadowing echogenic focus within gallbladder 11.2 cm length most consistent with cholelithiasis. The presence of shadowing makes this less likely to represent a large sludge ball. Gallbladder is distended, 12.9 cm length. No gallbladder wall thickening or pericholecystic fluid. Equivocal sonographic Murphy sign. Common bile duct: Diameter: 8 mm mildly dilated for age Liver: Normal appearance Image quality degraded secondary to body habitus. No RIGHT upper quadrant free fluid.  IMPRESSION: Distended gallbladder containing an 11.2 mm diameter shadowing focus most likely representing cholelithiasis. Equivocal sonographic Percell Miller sign without evidence of gallbladder wall thickening. Mild biliary dilatation for age; recommend correlation with LFTs. Electronically Signed   By: Lavonia Dana M.D.   On: 04/16/2015 08:24     EKG: (Independently reviewed) sinus rhythm with ventricular rate 86 bpm, QTC 465 ms, no  ischemic changes.   Assessment & Plan  Principal Problem:   Acute bilateral upper abdominal pain/?? Symptomatic cholelithiasis -Admit to telemetry -Although reported as chest pain initially on exam clearly has upper abdominal pain -Symptoms seem consistent with biliary colic so check HIDA scan -LFTs are normal -Surgery has formally evaluated the patient and our concern of a possible mesenteric ischemic process versus splenic infarct so have ordered a CT angiogram of the abdomen especially in setting of known history of DVT and PE; consideration also should be given to possible hepatic vein thrombosis that she presented with subtherapeutic INR -Check lactic acid -NPO -IV fluids -Surgery has ordered empiric Rocephin -PPI twice a day-patient reports excessive use of ibuprofen in the past week and symptoms could be secondary to peptic ulcer disease -If above workup unrevealing likely will need GI evaluation/consultation  Active Problems:   History of pulmonary embolism/DVT -On warfarin at home -Presented with subtherapeutic INR -Hold warfarin in the event surgical intervention needed -Pharmacy to dose heparin infusion      HTN (hypertension) -Uncontrolled in setting of unrelenting abdominal pain -Continue preadmission medications except for thiazide diuretic    GASTROESOPHAGEAL REFLUX DISEASE -PPI as above    CKD (chronic kidney disease), stage III -Creatinine basically near baseline although slightly elevated and may be reflective of patient's volume depletion  and recent excessive use of NSAIDs    OBESITY, MORBID/OSA     Anemia -Hemoglobin stable and at baseline    DVT Prophylaxis: IV heparin  Family Communication:  No family at bedside   Code Status:  Full code  Condition:  Stable  Discharge disposition: Once medically stable anticipate return to previous home environment  Time spent in minutes : 60      ELLIS,ALLISON L. ANP on 04/16/2015 at 11:49 AM  Between 7am to 7pm - Pager - [phone]  After 7pm go to [email or web address] - password [email or web address]  And look for the night coverage person covering me after hours  [facility]

## 2015-04-16 NOTE — ED Notes (Addendum)
PER EMS: Patient comes from home c/o severe CP going across mid chest worsening since last night 2100. Per EMS, patient has had this pain off and on for a month and has been seen and treated it a few weeks ago - was told it was GERD and referred to PCP.  Patient states this episode started last night, she took ibuprofen and it made it worse. Pain worse when lying flat. EMS VS: 152/113, HR 88 NSR. Patient refused ASA and NTG as this may be GERD pain. Patient has active DVT in R leg and hx of PEs, but patient reports this pain is different than PE. Patient belching, crying and writhing in bed. A&O x 4.

## 2015-04-16 NOTE — ED Notes (Signed)
Attempted report x1. 

## 2015-04-17 DIAGNOSIS — R1012 Left upper quadrant pain: Secondary | ICD-10-CM

## 2015-04-17 DIAGNOSIS — R1011 Right upper quadrant pain: Secondary | ICD-10-CM

## 2015-04-17 LAB — CBC
HEMATOCRIT: 35.2 % — AB (ref 36.0–46.0)
Hemoglobin: 11.8 g/dL — ABNORMAL LOW (ref 12.0–15.0)
MCH: 26.8 pg (ref 26.0–34.0)
MCHC: 33.5 g/dL (ref 30.0–36.0)
MCV: 80 fL (ref 78.0–100.0)
Platelets: 231 10*3/uL (ref 150–400)
RBC: 4.4 MIL/uL (ref 3.87–5.11)
RDW: 15.8 % — AB (ref 11.5–15.5)
WBC: 9.1 10*3/uL (ref 4.0–10.5)

## 2015-04-17 LAB — COMPREHENSIVE METABOLIC PANEL
ALBUMIN: 2.8 g/dL — AB (ref 3.5–5.0)
ALT: 211 U/L — ABNORMAL HIGH (ref 14–54)
AST: 270 U/L — AB (ref 15–41)
Alkaline Phosphatase: 132 U/L — ABNORMAL HIGH (ref 38–126)
Anion gap: 9 (ref 5–15)
BILIRUBIN TOTAL: 1.2 mg/dL (ref 0.3–1.2)
BUN: 13 mg/dL (ref 6–20)
CHLORIDE: 104 mmol/L (ref 101–111)
CO2: 25 mmol/L (ref 22–32)
Calcium: 8.1 mg/dL — ABNORMAL LOW (ref 8.9–10.3)
Creatinine, Ser: 1.13 mg/dL — ABNORMAL HIGH (ref 0.44–1.00)
GFR calc Af Amer: >60 mL/min (ref >60)
GFR calc non Af Amer: 59 mL/min — ABNORMAL LOW (ref >60)
GLUCOSE: 113 mg/dL — AB (ref 65–99)
POTASSIUM: 3.4 mmol/L — AB (ref 3.5–5.1)
Sodium: 138 mmol/L (ref 135–145)
TOTAL PROTEIN: 6.4 g/dL — AB (ref 6.5–8.1)

## 2015-04-17 LAB — PROTIME-INR
INR: 2.1 — AB (ref 0.00–1.49)
INR: 1.79 — AB (ref 0.00–1.49)
PROTHROMBIN TIME: 20.7 s — AB (ref 11.6–15.2)
Prothrombin Time: 23.4 seconds — ABNORMAL HIGH (ref 11.6–15.2)

## 2015-04-17 LAB — HEPARIN LEVEL (UNFRACTIONATED)
HEPARIN UNFRACTIONATED: 0.67 [IU]/mL (ref 0.30–0.70)
Heparin Unfractionated: 0.34 IU/mL (ref 0.30–0.70)

## 2015-04-17 LAB — TYPE AND SCREEN
ABO/RH(D): AB POS
ANTIBODY SCREEN: NEGATIVE

## 2015-04-17 LAB — SURGICAL PCR SCREEN
MRSA, PCR: NEGATIVE
Staphylococcus aureus: NEGATIVE

## 2015-04-17 MED ORDER — LIDOCAINE HCL (CARDIAC) 20 MG/ML IV SOLN
INTRAVENOUS | Status: AC
  Filled 2015-04-17: qty 5

## 2015-04-17 MED ORDER — BUPIVACAINE HCL (PF) 0.25 % IJ SOLN
INTRAMUSCULAR | Status: AC
  Filled 2015-04-17: qty 30

## 2015-04-17 MED ORDER — HYDROMORPHONE HCL 1 MG/ML IJ SOLN
1.0000 mg | INTRAMUSCULAR | Status: DC | PRN
  Administered 2015-04-17 – 2015-04-21 (×15): 1 mg via INTRAVENOUS
  Filled 2015-04-17 (×15): qty 1

## 2015-04-17 MED ORDER — SODIUM CHLORIDE 0.9 % IV SOLN
INTRAVENOUS | Status: DC
  Administered 2015-04-17: 20:00:00 via INTRAVENOUS

## 2015-04-17 MED ORDER — METOPROLOL TARTRATE 1 MG/ML IV SOLN: 5.0000 mg | INTRAVENOUS | Status: DC | PRN

## 2015-04-17 MED ORDER — MORPHINE SULFATE (PF) 2 MG/ML IV SOLN
2.0000 mg | INTRAVENOUS | Status: DC | PRN
  Administered 2015-04-17: 2 mg via INTRAVENOUS

## 2015-04-17 MED ORDER — HYDRALAZINE HCL 20 MG/ML IJ SOLN: 10.0000 mg | Freq: Four times a day (QID) | INTRAMUSCULAR | Status: DC | PRN

## 2015-04-17 MED ORDER — PROPOFOL 10 MG/ML IV BOLUS
INTRAVENOUS | Status: AC
  Filled 2015-04-17: qty 20

## 2015-04-17 MED ORDER — ONDANSETRON HCL 4 MG/2ML IJ SOLN
INTRAMUSCULAR | Status: AC
  Filled 2015-04-17: qty 2

## 2015-04-17 MED ORDER — EPHEDRINE SULFATE 50 MG/ML IJ SOLN
INTRAMUSCULAR | Status: AC
  Filled 2015-04-17: qty 1

## 2015-04-17 MED ORDER — STERILE WATER FOR INJECTION IJ SOLN
INTRAMUSCULAR | Status: AC
  Filled 2015-04-17: qty 10

## 2015-04-17 MED ORDER — SODIUM CHLORIDE 0.9 % IV SOLN: Freq: Once | INTRAVENOUS | Status: DC

## 2015-04-17 MED ORDER — MORPHINE SULFATE (PF) 2 MG/ML IV SOLN
INTRAVENOUS | Status: AC
  Filled 2015-04-17: qty 1

## 2015-04-17 MED ORDER — HEPARIN (PORCINE) IN NACL 100-0.45 UNIT/ML-% IJ SOLN
1600.0000 [IU]/h | INTRAMUSCULAR | Status: DC
  Administered 2015-04-17: 1600 [IU]/h via INTRAVENOUS
  Filled 2015-04-17 (×3): qty 250

## 2015-04-17 MED ORDER — SUCCINYLCHOLINE CHLORIDE 20 MG/ML IJ SOLN
INTRAMUSCULAR | Status: AC
  Filled 2015-04-17: qty 1

## 2015-04-17 MED ORDER — ROCURONIUM BROMIDE 50 MG/5ML IV SOLN
INTRAVENOUS | Status: AC
  Filled 2015-04-17: qty 1

## 2015-04-17 MED ORDER — MORPHINE SULFATE (PF) 2 MG/ML IV SOLN
2.0000 mg | INTRAVENOUS | Status: DC | PRN
  Administered 2015-04-17: 4 mg via INTRAVENOUS
  Administered 2015-04-17 (×2): 2 mg via INTRAVENOUS
  Administered 2015-04-17: 4 mg via INTRAVENOUS
  Filled 2015-04-17: qty 1
  Filled 2015-04-17 (×3): qty 2

## 2015-04-17 MED ORDER — SODIUM CHLORIDE 0.9 % IV SOLN
3.0000 g | Freq: Four times a day (QID) | INTRAVENOUS | Status: DC
  Filled 2015-04-17 (×2): qty 3

## 2015-04-17 MED ORDER — VITAMIN K1 10 MG/ML IJ SOLN
5.0000 mg | Freq: Once | INTRAVENOUS | Status: AC
  Administered 2015-04-17: 5 mg via INTRAVENOUS
  Filled 2015-04-17 (×2): qty 0.5

## 2015-04-17 MED ORDER — DEXTROSE 5 % IV SOLN
2.0000 g | INTRAVENOUS | Status: DC
  Administered 2015-04-18 – 2015-04-21 (×4): 2 g via INTRAVENOUS
  Filled 2015-04-17 (×6): qty 2

## 2015-04-17 NOTE — Progress Notes (Signed)
inr 1.7.  Cancel surgery.  INR in AM.  Resume heparin drip.  HEPARIN DRIP NEEDS TO BE STOPPED TONIGHT AT MIDNIGHT  Allien Melberg ANP-BC

## 2015-04-17 NOTE — Progress Notes (Signed)
ANTICOAGULATION CONSULT NOTE - Follow Up Consult  Pharmacy Consult for heparin Indication: CP w/ h/o DVT   Labs:  Recent Labs  04/16/15 0520 04/16/15 1520 04/16/15 2309  HGB 12.5  --   --   HCT 37.8  --   --   PLT 266  --   --   LABPROT 21.8*  --   --   INR 1.91*  --   --   HEPARINUNFRC  --  0.56 0.89*  CREATININE 1.28*  --   --      Assessment: 44yo female now supratherapeutic on heparin after one level at goal.  Goal of Therapy:  Heparin level 0.3-0.7 units/ml   Plan:  Will decrease heparin gtt by ~2 units/kgABW/hr to 1600 units/hr and check heparin level in 6hr.  Wynona Neat, PharmD, BCPS  04/17/2015,12:30 AM

## 2015-04-17 NOTE — Progress Notes (Signed)
ANTICOAGULATION CONSULT NOTE - Initial Consult  Pharmacy Consult for heparin Indication: hx of DVT (was on coumadin which was reversed)  Allergies  Allergen Reactions  . Oxycodone Hcl Nausea Only    Patient Measurements: Height: 5\' 8"  (172.7 cm) (03/01/2015) Weight: (!) 360 lb 7.2 oz (163.5 kg) IBW/kg (Calculated) : 63.9 Heparin Dosing Weight: 105 kg  Vital Signs: Temp: 99.3 F (37.4 C) (12/02 1417) Temp Source: Oral (12/02 1417) BP: 143/89 mmHg (12/02 1417) Pulse Rate: 94 (12/02 1417)  Labs:  Recent Labs  04/16/15 0520 04/16/15 1520 04/16/15 2309 04/17/15 0706 04/17/15 1012 04/17/15 1421  HGB 12.5  --   --  11.8*  --   --   HCT 37.8  --   --  35.2*  --   --   PLT 266  --   --  231  --   --   LABPROT 21.8*  --   --   --  23.4* 20.7*  INR 1.91*  --   --   --  2.10* 1.79*  HEPARINUNFRC  --  0.56 0.89* 0.67  --   --   CREATININE 1.28*  --   --  1.13*  --   --     Estimated Creatinine Clearance: 105.1 mL/min (by C-G formula based on Cr of 1.13).   Medical History: Past Medical History  Diagnosis Date  . Hypertension   . Varicose veins of right lower extremity   . Anemia   . GERD (gastroesophageal reflux disease)   . DVT (deep venous thrombosis) ([facility])     BLE  . Pulmonary embolism ([place])   . Obesity   . Sinusitis nasal   . Pneumonia 2014  . Chronic bronchitis ([place])   . Sleep apnea     "I'm suppose to wear a mask but I don't" (04/16/2015)  . Headache     "weekly" (04/16/2015)  . Migraine     "monthly" (04/16/2015)  . Chronic upper back pain   . Anxiety   . Depression     Medications:  Scheduled:  . [START ON 04/18/2015] cefTRIAXone (ROCEPHIN)  IV  2 g Intravenous Q24H  . pantoprazole (PROTONIX) IV  40 mg Intravenous Q12H   Infusions:  . sodium chloride 75 mL/hr at 04/17/15 1225    Assessment: 44 yo female with hx of of DVT will be restarted on heparin.  Patient was previously on coumadin but was reversed with vitamin K in case for GI procedure.   INR today is 1.79.  Hgb 11.8 and Plt 231 K.  Home coumadin dose was 10 mg po daily   Goal of Therapy:  Heparin level 0.3-0.7 units/ml Monitor platelets by anticoagulation protocol: Yes   Plan:  - Start heparin at 1600 units/hr. No bolus - 6hr heparin level - daily heparin level and CBC  Sheba Whaling, Tsz-Yin 04/17/2015,3:15 PM

## 2015-04-17 NOTE — Progress Notes (Signed)
a   04/17/15 1000  Clinical Encounter Type  Visited With Patient  Visit Type Initial  Referral From Nurse  Consult/Referral To Chaplain  Chaplain visited with Pt; pt and spouse was up walking around; Chaplain told Pt if she wanted some to come back later after her walk I would

## 2015-04-17 NOTE — Progress Notes (Signed)
Patient Demographics:    Alice [name], is a 44 y.o. female, DOB - Apr 03, 1971, [id number]  Admit date - 04/16/2015   Admitting Physician Geradine Girt, DO  Outpatient Primary MD for the patient is OSEI-BONSU,GEORGE, MD  LOS - 1   Chief Complaint  Patient presents with  . Chest Pain        Subjective:    Alice [name] today has, No headache, No chest pain, does have right upper quadrant abdominal pain - No Nausea, No new weakness tingling or numbness, No Cough - SOB.     Assessment  & Plan :     1. Acute cholecystitis could be secondary to gallstones. General surgery on board, nothing by mouth, IV fluids, switch antibiotics from Rocephin to Unasyn, likely surgery later today. Discussed her case with GI physician on-call Dr. Penelope Coop as well. Nothing to offer at this time.   2. Mildly elevated LFTs. Could be due to #1 above, question if there is a stone and CBD now as well, likely intraoperative cholangiogram will be helpful.   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 Hold Coumadin, reverse INR with vitamin K IV, once INR is below 2 heparin drip per pharmacy.   4. Morbid obesity. Outpatient follow-up with PCP post discharge.   5. Essential hypertension. Place on as needed IV Lopressor and hydralazine and monitor.     Code Status : Full  Family Communication  : None present  Disposition Plan  :  Likely home in 1-2 days  Consults  :  Gen. surgery, discussed with GI Dr. Harden Mo over the phone.  Procedures  : CT scan abdomen and pelvis, right upper quadrant ultrasound, HIDA scan. All suggestive of acute cholecystitis.  DVT Prophylaxis  :  Heparin/ Coumadin  Lab Results  Component Value Date   PLT 231 04/17/2015    Inpatient Medications  Scheduled Meds: .  cefTRIAXone (ROCEPHIN)  IV  1 g Intravenous Q24H  . pantoprazole (PROTONIX) IV  40 mg Intravenous Q12H  . phytonadione (VITAMIN K) IV  5 mg Intravenous Once   Continuous Infusions: . sodium chloride     PRN Meds:.acetaminophen **OR** [DISCONTINUED] acetaminophen, hydrALAZINE, morphine injection, ondansetron (ZOFRAN) IV  Antibiotics  :     Anti-infectives    Start     Dose/Rate Route Frequency Ordered Stop   04/17/15 1000  cefTRIAXone (ROCEPHIN) 1 g in dextrose 5 % 50 mL IVPB     1 g 100 mL/hr over 30 Minutes Intravenous Every 24 hours 04/16/15 1439     04/16/15 1115  cefTRIAXone (ROCEPHIN) 1 g in dextrose 5 % 50 mL IVPB  Status:  Discontinued     1 g 100 mL/hr over 30 Minutes Intravenous Every 24 hours 04/16/15 1110 04/16/15 1438        Objective:   Filed Vitals:   04/16/15 2022 04/17/15 0500 04/17/15 0532 04/17/15 0943  BP: 147/90  135/90 137/89  Pulse: 92  93 106  Temp: 98.4 F (36.9 C)  99.9 F (37.7 C) 97.8 F (36.6 C)  TempSrc: Oral  Oral   Resp: 19  18 20   Height:      Weight:  163.5 kg (360 lb 7.2 oz)  SpO2: 100%  100% 98%    Wt Readings from Last 3 Encounters:  04/17/15 163.5 kg (360 lb 7.2 oz)  03/01/15 158.759 kg (350 lb)  04/05/14 158.759 kg (350 lb)     Intake/Output Summary (Last 24 hours) at 04/17/15 1143 Last data filed at 04/17/15 0608  Gross per 24 hour  Intake 1895.33 ml  Output   1375 ml  Net 520.33 ml     Physical Exam  Awake Alert, Oriented X 3, No new F.N deficits, Normal affect [place].AT,PERRAL Supple Neck,No JVD, No cervical lymphadenopathy appriciated.  Symmetrical Chest wall movement, Good air movement bilaterally, CTAB RRR,No Gallops,Rubs or new Murmurs, No Parasternal Heave +ve B.Sounds, Abd Soft, +ve RUQ tenderness, No organomegaly appriciated, No rebound - guarding or rigidity. No Cyanosis, Clubbing or edema, No new Rash or bruise       Data Review:   Micro Results Recent Results (from the past 240 hour(s))  Surgical  pcr screen     Status: None   Collection Time: 04/17/15  9:40 AM  Result Value Ref Range Status   MRSA, PCR NEGATIVE NEGATIVE Final   Staphylococcus aureus NEGATIVE NEGATIVE Final    Comment:        The Xpert SA Assay (FDA approved for NASAL specimens in patients over 45 years of age), is one component of a comprehensive surveillance program.  Test performance has been validated by [facility] for patients greater than or equal to 23 year old. It is not intended to diagnose infection nor to guide or monitor treatment.     Radiology Reports Dg Chest 2 View  04/16/2015  CLINICAL DATA:  Chest pain and nausea, onset tonight. EXAM: CHEST  2 VIEW COMPARISON:  03/01/2015 FINDINGS: The heart size and mediastinal contours are within normal limits. Both lungs are clear. The visualized skeletal structures are unremarkable. IMPRESSION: No active cardiopulmonary disease. Electronically Signed   By: Andreas Newport M.D.   On: 04/16/2015 06:22   Nm Hepato W/eject Fract  04/16/2015  CLINICAL DATA:  Abdominal pain.  Chest pain. EXAM: NUCLEAR MEDICINE HEPATOBILIARY IMAGING TECHNIQUE: Sequential images of the abdomen were obtained out to 60 minutes following intravenous administration of radiopharmaceutical. RADIOPHARMACEUTICALS:  5.0 mCi Tc-68m  Choletec IV COMPARISON:  04/16/2015 FINDINGS: Flow study shows uniform distribution of radioactivity in the liver. Sequential imaging of the liver and abdomen over a period of 60 minutes shows uniform uptake of the tracer in the liver. There is no filling of the gallbladder at the end of 60 minutes. 6.3 mg of morphine was administered and imaging was performed for an additional 30 minutes demonstrating no filling of the gallbladder. Radiotracer uptake is present in the small bowel at minutes. At the end of 1-hour, there is relatively good clearance of activity from the liver. IMPRESSION: 1. Findings most consistent with cystic duct obstruction and cholecystitis.  Electronically Signed   By: Kathreen Devoid   On: 04/16/2015 19:17   Ct Angio Abd/pel W/ And/or W/o  04/16/2015  CLINICAL DATA:  Epigastric pain, left upper quadrant abdominal pain and lower left chest pain radiating into back. Evidence of gallbladder distention and cholelithiasis by ultrasound. EXAM: CTA ABDOMEN AND PELVIS WITHOUT CONTRAST TECHNIQUE: Multidetector CT imaging of the abdomen and pelvis was performed using the standard protocol during bolus administration of intravenous contrast. Multiplanar reconstructed images and MIPs were obtained and reviewed to evaluate the vascular anatomy. CONTRAST:  163mL OMNIPAQUE IOHEXOL 350 MG/ML SOLN COMPARISON:  Right upper quadrant abdominal ultrasound on 04/16/2015 as  well as prior CT of the abdomen and pelvis on 03/01/2015. FINDINGS: The abdominal aorta is of normal caliber and shows no evidence of aneurysmal disease or atherosclerosis. Visceral branch vessels are well demonstrated with normal patency of the celiac axis, superior mesenteric artery, bilateral single renal arteries and the inferior mesenteric artery. Bilateral iliac and common femoral arteries are normally patent. Stable gallbladder distension without evidence of biliary ductal dilatation. There is a small amount of fluid in between the right kidney and the medial margin of the inferior right lobe of the liver which was not present on the prior CT. This is close to the posterior margin of the gallbladder. This is nonspecific but may implicate some degree of cholecystitis. No other significant inflammatory changes are seen surrounding other margins of the gallbladder. 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 No evidence of bowel obstruction or perforation. No focal abscess is identified. The bladder is unremarkable. IUD again visualized in the endometrial cavity of the uterus. No hernias, focal masses or enlarged lymph nodes are seen. Bony structures show degenerative disc disease at L4-5 and  L5-S1. Review of the MIP images confirms the above findings. IMPRESSION: 1. Normal abdominal aorta and visceral arteries with no evidence by CTA of mesenteric arterial occlusive disease. 2. Similar gallbladder distension to prior CT and ultrasound exams. There is a small amount of fluid posterior to the gallbladder and in between the right kidney and right lobe of the liver. This was not present on the prior CT and may implicate some regional inflammation, potentially from the gallbladder. Nuclear medicine hepatobiliary scintigraphy may be helpful in determining whether there is evidence of cholecystitis. 3. Prior gastric bypass surgery without evidence of complication or acute process involving the bowel. Electronically Signed   By: Aletta Edouard M.D.   On: 04/16/2015 15:00   US Abdomen Limited Ruq  04/16/2015  CLINICAL DATA:  Epigastric pain, history of gastric bypass, hypertension, obesity EXAM: US ABDOMEN LIMITED - RIGHT UPPER QUADRANT COMPARISON:  CT abdomen pelvis 03/01/2015 FINDINGS: Gallbladder: Large shadowing echogenic focus within gallbladder 11.2 cm length most consistent with cholelithiasis. The presence of shadowing makes this less likely to represent a large sludge ball. Gallbladder is distended, 12.9 cm length. No gallbladder wall thickening or pericholecystic fluid. Equivocal sonographic Murphy sign. Common bile duct: Diameter: 8 mm mildly dilated for age Liver: Normal appearance Image quality degraded secondary to body habitus. No RIGHT upper quadrant free fluid. IMPRESSION: Distended gallbladder containing an 11.2 mm diameter shadowing focus most likely representing cholelithiasis. Equivocal sonographic Percell Miller sign without evidence of gallbladder wall thickening. Mild biliary dilatation for age; recommend correlation with LFTs. Electronically Signed   By: Lavonia Dana M.D.   On: 04/16/2015 08:24     CBC  Recent Labs Lab 04/16/15 0520 04/17/15 0706  WBC 7.9 9.1  HGB 12.5 11.8*  HCT  37.8 35.2*  PLT 266 231  MCV 79.4 80.0  MCH 26.3 26.8  MCHC 33.1 33.5  RDW 15.7* 15.8*  LYMPHSABS 1.9  --   MONOABS 0.4  --   EOSABS 0.2  --   BASOSABS 0.0  --     Chemistries   Recent Labs Lab 04/16/15 0520 04/17/15 0706  NA 138 138  K 3.4* 3.4*  CL 98* 104  CO2 30 25  GLUCOSE 120* 113*  BUN 18 13  CREATININE 1.28* 1.13*  CALCIUM 9.3 8.1*  AST 27 270*  ALT 22 211*  ALKPHOS 87 132*  BILITOT 0.6 1.2   ------------------------------------------------------------------------------------------------------------------  estimated creatinine clearance is 105.1 mL/min (by C-G formula based on Cr of 1.13). ------------------------------------------------------------------------------------------------------------------ No results for input(s): HGBA1C in the last 72 hours. ------------------------------------------------------------------------------------------------------------------ No results for input(s): CHOL, HDL, LDLCALC, TRIG, CHOLHDL, LDLDIRECT in the last 72 hours. ------------------------------------------------------------------------------------------------------------------ No results for input(s): TSH, T4TOTAL, T3FREE, THYROIDAB in the last 72 hours.  Invalid input(s): FREET3 ------------------------------------------------------------------------------------------------------------------ No results for input(s): VITAMINB12, FOLATE, FERRITIN, TIBC, IRON, RETICCTPCT in the last 72 hours.  Coagulation profile  Recent Labs Lab 04/16/15 0520 04/17/15 1012  INR 1.91* 2.10*    No results for input(s): DDIMER in the last 72 hours.  Cardiac Enzymes No results for input(s): CKMB, TROPONINI, MYOGLOBIN in the last 168 hours.  Invalid input(s): CK ------------------------------------------------------------------------------------------------------------------ Invalid input(s): POCBNP   Time Spent in minutes 35   SINGH,PRASHANT K M.D on 04/17/2015 at 11:43  AM  Between 7am to 7pm - Pager - [phone]  After 7pm go to [email or web address] - password [facility]  [facility] -  Office  [phone]

## 2015-04-17 NOTE — Progress Notes (Signed)
Patient ID: Alice [name], female   DOB: June 25, 1970, 44 y.o.   MRN: [id number]     [facility]      [address] [place], [place] [address]    Phone: [phone] FAX: [phone]     Subjective: Pain migrated to RUQ yesterday.  Severe. LFTs are up.  Normal white count.   Objective:  Vital signs:  Filed Vitals:   04/16/15 1230 04/16/15 2022 04/17/15 0500 04/17/15 0532  BP: 160/105 147/90  135/90  Pulse: 80 92  93  Temp:  98.4 F (36.9 C)  99.9 F (37.7 C)  TempSrc:  Oral  Oral  Resp: _0 Height:      Weight:   163.5 kg (360 lb 7.2 oz)   SpO2: 96% 100%  100%    Last BM Date: 04/16/15  Intake/Output   Yesterday:  12/01 0701 - 12/02 0700 In: 1895.3 [P.O.:474; I.V.:1421.3] Out: 1375 [Urine:1375] This shift:    I/O last 3 completed shifts: In: 1895.3 [P.O.:474; I.V.:1421.3] Out: 8502 [Urine:1375]   Physical Exam: General: Pt awake/alert/oriented x4 in no acute distress Abdomen: Soft.  Nondistended.  TTP RUQ.   Problem List:   Principal Problem:   Acute bilateral upper abdominal pain Active Problems:   OBESITY, MORBID   HTN (hypertension)   History of pulmonary embolism   History of DVT (deep vein thrombosis)   GASTROESOPHAGEAL REFLUX DISEASE   OSA (obstructive sleep apnea)   CKD (chronic kidney disease), stage III   Anemia   Symptomatic cholelithiasis    Results:   Labs: Results for orders placed or performed during the hospital encounter of 04/16/15 (from the past 48 hour(s))  CBC with Differential     Status: Abnormal   Collection Time: 04/16/15  5:20 AM  Result Value Ref Range   WBC 7.9 4.0 - 10.5 K/uL   RBC 4.76 3.87 - 5.11 MIL/uL   Hemoglobin 12.5 12.0 - 15.0 g/dL   HCT 37.8 36.0 - 46.0 %   MCV 79.4 78.0 - 100.0 fL   MCH 26.3 26.0 - 34.0 pg   MCHC 33.1 30.0 - 36.0 g/dL   RDW 15.7 (H) 11.5 - 15.5 %   Platelets 266 150 - 400 K/uL   Neutrophils Relative % 68 %   Neutro Abs 5.4 1.7 - 7.7  K/uL   Lymphocytes Relative 24 %   Lymphs Abs 1.9 0.7 - 4.0 K/uL   Monocytes Relative 5 %   Monocytes Absolute 0.4 0.1 - 1.0 K/uL   Eosinophils Relative 3 %   Eosinophils Absolute 0.2 0.0 - 0.7 K/uL   Basophils Relative 0 %   Basophils Absolute 0.0 0.0 - 0.1 K/uL  Lipase, blood     Status: None   Collection Time: 04/16/15  5:20 AM  Result Value Ref Range   Lipase 38 11 - 51 U/L  Comprehensive metabolic panel     Status: Abnormal   Collection Time: 04/16/15  5:20 AM  Result Value Ref Range   Sodium 138 135 - 145 mmol/L   Potassium 3.4 (L) 3.5 - 5.1 mmol/L   Chloride 98 (L) 101 - 111 mmol/L   CO2 30 22 - 32 mmol/L   Glucose, Bld 120 (H) 65 - 99 mg/dL   BUN 18 6 - 20 mg/dL   Creatinine, Ser 1.28 (H) 0.44 - 1.00 mg/dL   Calcium 9.3 8.9 - 10.3 mg/dL   Total Protein 7.4 6.5 - 8.1 g/dL  Albumin 3.3 (L) 3.5 - 5.0 g/dL   AST 27 15 - 41 U/L   ALT 22 14 - 54 U/L   Alkaline Phosphatase 87 38 - 126 U/L   Total Bilirubin 0.6 0.3 - 1.2 mg/dL   GFR calc non Af Amer 50 (L) >60 mL/min   GFR calc Af Amer 58 (L) >60 mL/min    Comment: (NOTE) The eGFR has been calculated using the CKD EPI equation. This calculation has not been validated in all clinical situations. eGFR's persistently <60 mL/min signify possible Chronic Kidney Disease.    Anion gap 10 5 - 15  Protime-INR     Status: Abnormal   Collection Time: 04/16/15  5:20 AM  Result Value Ref Range   Prothrombin Time 21.8 (H) 11.6 - 15.2 seconds   INR 1.91 (H) 0.00 - 1.49  I-Stat Troponin, ED (not at [facility])     Status: None   Collection Time: 04/16/15  5:33 AM  Result Value Ref Range   Troponin i, poc 0.00 0.00 - 0.08 ng/mL   Comment 3            Comment: Due to the release kinetics of cTnI, a negative result within the first hours of the onset of symptoms does not rule out myocardial infarction with certainty. If myocardial infarction is still suspected, repeat the test at appropriate intervals.   Lactic acid, plasma     Status:  None   Collection Time: 04/16/15 12:44 PM  Result Value Ref Range   Lactic Acid, Venous 1.2 0.5 - 2.0 mmol/L  Heparin level (unfractionated)     Status: None   Collection Time: 04/16/15  3:20 PM  Result Value Ref Range   Heparin Unfractionated 0.56 0.30 - 0.70 IU/mL    Comment:        IF HEPARIN RESULTS ARE BELOW EXPECTED VALUES, AND PATIENT DOSAGE HAS BEEN CONFIRMED, SUGGEST FOLLOW UP TESTING OF ANTITHROMBIN III LEVELS.   Heparin level (unfractionated)     Status: Abnormal   Collection Time: 04/16/15 11:09 PM  Result Value Ref Range   Heparin Unfractionated 0.89 (H) 0.30 - 0.70 IU/mL    Comment:        IF HEPARIN RESULTS ARE BELOW EXPECTED VALUES, AND PATIENT DOSAGE HAS BEEN CONFIRMED, SUGGEST FOLLOW UP TESTING OF ANTITHROMBIN III LEVELS.   Heparin level (unfractionated)     Status: None   Collection Time: 04/17/15  7:06 AM  Result Value Ref Range   Heparin Unfractionated 0.67 0.30 - 0.70 IU/mL    Comment:        IF HEPARIN RESULTS ARE BELOW EXPECTED VALUES, AND PATIENT DOSAGE HAS BEEN CONFIRMED, SUGGEST FOLLOW UP TESTING OF ANTITHROMBIN III LEVELS.   CBC     Status: Abnormal   Collection Time: 04/17/15  7:06 AM  Result Value Ref Range   WBC 9.1 4.0 - 10.5 K/uL   RBC 4.40 3.87 - 5.11 MIL/uL   Hemoglobin 11.8 (L) 12.0 - 15.0 g/dL   HCT 35.2 (L) 36.0 - 46.0 %   MCV 80.0 78.0 - 100.0 fL   MCH 26.8 26.0 - 34.0 pg   MCHC 33.5 30.0 - 36.0 g/dL   RDW 15.8 (H) 11.5 - 15.5 %   Platelets 231 150 - 400 K/uL  Comprehensive metabolic panel     Status: Abnormal   Collection Time: 04/17/15  7:06 AM  Result Value Ref Range   Sodium 138 135 - 145 mmol/L   Potassium 3.4 (L) 3.5 - 5.1 mmol/L  Chloride 104 101 - 111 mmol/L   CO2 25 22 - 32 mmol/L   Glucose, Bld 113 (H) 65 - 99 mg/dL   BUN 13 6 - 20 mg/dL   Creatinine, Ser 1.13 (H) 0.44 - 1.00 mg/dL   Calcium 8.1 (L) 8.9 - 10.3 mg/dL   Total Protein 6.4 (L) 6.5 - 8.1 g/dL   Albumin 2.8 (L) 3.5 - 5.0 g/dL   AST 270 (H) 15 -  41 U/L   ALT 211 (H) 14 - 54 U/L   Alkaline Phosphatase 132 (H) 38 - 126 U/L   Total Bilirubin 1.2 0.3 - 1.2 mg/dL   GFR calc non Af Amer 59 (L) >60 mL/min   GFR calc Af Amer >60 >60 mL/min    Comment: (NOTE) The eGFR has been calculated using the CKD EPI equation. This calculation has not been validated in all clinical situations. eGFR's persistently <60 mL/min signify possible Chronic Kidney Disease.    Anion gap 9 5 - 15    Imaging / Studies: Dg Chest 2 View  04/16/2015  CLINICAL DATA:  Chest pain and nausea, onset tonight. EXAM: CHEST  2 VIEW COMPARISON:  03/01/2015 FINDINGS: The heart size and mediastinal contours are within normal limits. Both lungs are clear. The visualized skeletal structures are unremarkable. IMPRESSION: No active cardiopulmonary disease. Electronically Signed   By: Andreas Newport M.D.   On: 04/16/2015 06:22   Nm Hepato W/eject Fract  04/16/2015  CLINICAL DATA:  Abdominal pain.  Chest pain. EXAM: NUCLEAR MEDICINE HEPATOBILIARY IMAGING TECHNIQUE: Sequential images of the abdomen were obtained out to 60 minutes following intravenous administration of radiopharmaceutical. RADIOPHARMACEUTICALS:  5.0 mCi Tc-43m  Choletec IV COMPARISON:  04/16/2015 FINDINGS: Flow study shows uniform distribution of radioactivity in the liver. Sequential imaging of the liver and abdomen over a period of 60 minutes shows uniform uptake of the tracer in the liver. There is no filling of the gallbladder at the end of 60 minutes. 6.3 mg of morphine was administered and imaging was performed for an additional 30 minutes demonstrating no filling of the gallbladder. Radiotracer uptake is present in the small bowel at minutes. At the end of 1-hour, there is relatively good clearance of activity from the liver. IMPRESSION: 1. Findings most consistent with cystic duct obstruction and cholecystitis. Electronically Signed   By: Kathreen Devoid   On: 04/16/2015 19:17   Ct Angio Abd/pel W/ And/or  W/o  04/16/2015  CLINICAL DATA:  Epigastric pain, left upper quadrant abdominal pain and lower left chest pain radiating into back. Evidence of gallbladder distention and cholelithiasis by ultrasound. EXAM: CTA ABDOMEN AND PELVIS WITHOUT CONTRAST TECHNIQUE: Multidetector CT imaging of the abdomen and pelvis was performed using the standard protocol during bolus administration of intravenous contrast. Multiplanar reconstructed images and MIPs were obtained and reviewed to evaluate the vascular anatomy. CONTRAST:  159mL OMNIPAQUE IOHEXOL 350 MG/ML SOLN COMPARISON:  Right upper quadrant abdominal ultrasound on 04/16/2015 as well as prior CT of the abdomen and pelvis on 03/01/2015. FINDINGS: The abdominal aorta is of normal caliber and shows no evidence of aneurysmal disease or atherosclerosis. Visceral branch vessels are well demonstrated with normal patency of the celiac axis, superior mesenteric artery, bilateral single renal arteries and the inferior mesenteric artery. Bilateral iliac and common femoral arteries are normally patent. Stable gallbladder distension without evidence of biliary ductal dilatation. There is a small amount of fluid in between the right kidney and the medial margin of the inferior right lobe of the liver  which was not present on the prior CT. This is close to the posterior margin of the gallbladder. This is nonspecific but may implicate some degree of cholecystitis. No other significant inflammatory changes are seen surrounding other margins of the gallbladder. 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 No evidence of bowel obstruction or perforation. No focal abscess is identified. The bladder is unremarkable. IUD again visualized in the endometrial cavity of the uterus. No hernias, focal masses or enlarged lymph nodes are seen. Bony structures show degenerative disc disease at L4-5 and L5-S1. Review of the MIP images confirms the above findings. IMPRESSION: 1. Normal abdominal  aorta and visceral arteries with no evidence by CTA of mesenteric arterial occlusive disease. 2. Similar gallbladder distension to prior CT and ultrasound exams. There is a small amount of fluid posterior to the gallbladder and in between the right kidney and right lobe of the liver. This was not present on the prior CT and may implicate some regional inflammation, potentially from the gallbladder. Nuclear medicine hepatobiliary scintigraphy may be helpful in determining whether there is evidence of cholecystitis. 3. Prior gastric bypass surgery without evidence of complication or acute process involving the bowel. Electronically Signed   By: Aletta Edouard M.D.   On: 04/16/2015 15:00   US Abdomen Limited Ruq  04/16/2015  CLINICAL DATA:  Epigastric pain, history of gastric bypass, hypertension, obesity EXAM: US ABDOMEN LIMITED - RIGHT UPPER QUADRANT COMPARISON:  CT abdomen pelvis 03/01/2015 FINDINGS: Gallbladder: Large shadowing echogenic focus within gallbladder 11.2 cm length most consistent with cholelithiasis. The presence of shadowing makes this less likely to represent a large sludge ball. Gallbladder is distended, 12.9 cm length. No gallbladder wall thickening or pericholecystic fluid. Equivocal sonographic Murphy sign. Common bile duct: Diameter: 8 mm mildly dilated for age Liver: Normal appearance Image quality degraded secondary to body habitus. No RIGHT upper quadrant free fluid. IMPRESSION: Distended gallbladder containing an 11.2 mm diameter shadowing focus most likely representing cholelithiasis. Equivocal sonographic Percell Miller sign without evidence of gallbladder wall thickening. Mild biliary dilatation for age; recommend correlation with LFTs. Electronically Signed   By: Lavonia Dana M.D.   On: 04/16/2015 08:24    Medications / Allergies:  Scheduled Meds: . cefTRIAXone (ROCEPHIN)  IV  1 g Intravenous Q24H  . pantoprazole (PROTONIX) IV  40 mg Intravenous Q12H   Continuous Infusions: . sodium  chloride 100 mL/hr at 04/17/15 0827   PRN Meds:.acetaminophen **OR** acetaminophen, hydrALAZINE, morphine injection, ondansetron (ZOFRAN) IV  Antibiotics: Anti-infectives    Start     Dose/Rate Route Frequency Ordered Stop   04/17/15 1000  cefTRIAXone (ROCEPHIN) 1 g in dextrose 5 % 50 mL IVPB     1 g 100 mL/hr over 30 Minutes Intravenous Every 24 hours 04/16/15 1439     04/16/15 1115  cefTRIAXone (ROCEPHIN) 1 g in dextrose 5 % 50 mL IVPB  Status:  Discontinued     1 g 100 mL/hr over 30 Minutes Intravenous Every 24 hours 04/16/15 1110 04/16/15 1438        Assessment/Plan Cholecystitis-proceed with a laparoscopic cholecystectomy later today if INR <1.5.  Stop heparin drip. INR 1.9 yesterday, STAT INR.  Surgical risks discussed including; infection, bleeding, injury to surrounding structures, open surgery, anesthesia risks.  The patient verbalizes understanding and wishes to proceed.\ ID-rocephin  Hx PE/DVT FEN-NPO, IVF, increase morphine scale Dispo-OR pending INR.    Erby Pian, [facility] Pager [phone]) For consults and floor pages call [phone]([id number]:30P)  04/17/2015 9:07 AM

## 2015-04-17 NOTE — Progress Notes (Signed)
ANTICOAGULATION CONSULT NOTE - Follow-up Consult  Pharmacy Consult for heparin Indication: hx of DVT (was on coumadin which was reversed)  Allergies  Allergen Reactions  . Oxycodone Hcl Nausea Only    Patient Measurements: Height: 5\' 8"  (172.7 cm) (03/01/2015) Weight: (!) 360 lb 7.2 oz (163.5 kg) IBW/kg (Calculated) : 63.9 Heparin Dosing Weight: 105 kg  Vital Signs: Temp: 99.3 F (37.4 C) (12/02 1417) Temp Source: Oral (12/02 1417) BP: 143/89 mmHg (12/02 1417) Pulse Rate: 94 (12/02 1417)  Labs:  Recent Labs  04/16/15 0520  04/16/15 2309 04/17/15 0706 04/17/15 1012 04/17/15 1421 04/17/15 2159  HGB 12.5  --   --  11.8*  --   --   --   HCT 37.8  --   --  35.2*  --   --   --   PLT 266  --   --  231  --   --   --   LABPROT 21.8*  --   --   --  23.4* 20.7*  --   INR 1.91*  --   --   --  2.10* 1.79*  --   HEPARINUNFRC  --   < > 0.89* 0.67  --   --  0.34  CREATININE 1.28*  --   --  1.13*  --   --   --   < > = values in this interval not displayed.  Estimated Creatinine Clearance: 105.1 mL/min (by C-G formula based on Cr of 1.13).   Assessment: 44 yo Alice [name] with hx of of DVT. Pt on coumadin PTA but holding and reversed with vitamin K in case needs GI procedure. Pt started on heparin bridge as INR < 2. Heparin level therapeutic (0.34) on 1600 units/hr. No bleeding noted.   Goal of Therapy:  Heparin level 0.3-0.7 units/ml Monitor platelets by anticoagulation protocol: Yes   Plan:  - Continue heparin 1600 units/hr - Daily heparin level and CBC  Sherlon Handing, PharmD, BCPS Clinical pharmacist, pager [phone] 04/17/2015,10:59 PM

## 2015-04-17 NOTE — Clinical Documentation Improvement (Signed)
Internal Medicine  Can the diagnosis of anemia be further specified? Please update your documentation within the medical record to reflect your response to this query. Do not document in BPA drop down box; findings belong in progress note. Thank you   Iron deficiency Anemia  Nutritional anemia, including the nutrition or mineral deficits  Chronic Blood Loss Anemia, including the suspected or known cause  Anemia of chronic disease, including the associated chronic disease state  Acute Blood Loss Anemia  Other  Clinically Undetermined  Document any associated diagnoses/conditions.  Supporting Information:  H&H stable at 12.5 / 37.8  to  11.8 / 35 this morning  Please exercise your independent, professional judgment when responding. A specific answer is not anticipated or expected.  Thank You,  Zoila Shutter RN, [place] [phone]; Cell: [phone]

## 2015-04-17 NOTE — Progress Notes (Addendum)
ANTICOAGULATION CONSULT NOTE - Follow Up Consult  Pharmacy Consult for heparin Indication: DVT/chest pain  Allergies  Allergen Reactions  . Oxycodone Hcl Nausea Only   Patient Measurements: Height: 5\' 8"  (172.7 cm) (03/01/2015) Weight: (!) 360 lb 7.2 oz (163.5 kg) IBW/kg (Calculated) : 63.9 Heparin Dosing Weight: 103.6 kg  Labs:  Recent Labs  04/16/15 0520 04/16/15 1520 04/16/15 2309 04/17/15 0706  HGB 12.5  --   --  11.8*  HCT 37.8  --   --  35.2*  PLT 266  --   --  231  LABPROT 21.8*  --   --   --   INR 1.91*  --   --   --   HEPARINUNFRC  --  0.56 0.89* 0.67  CREATININE 1.28*  --   --  1.13*   Assessment: 44 yo female presenting with complaints of chest pain. Pt has been seen for similar pain a few weeks ago and was told it was GERD. 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 She is currently on warfarin with an INR of 1.91, Hgb 12.5, Plt 266.  EKG normal, trop negative. Abd ultrasound showing enlarged gallbladder w/ stone, likely cause of current symptoms.  04/17/2015 Review:  HL = 0.67 after IV heparin rate changed to 1600 units/hr.  She is within desired goal range and her H/H and platelets are stable this morning.  No noted bleeding.  Goal of Therapy:  INR 2-3 Heparin level 0.3-0.7 units/ml Monitor platelets by anticoagulation protocol: Yes   Plan:  - Continue IV heparin infusion at 1600 units/hr - Continue to monitor H&H and platelets and anti-Xa levels daily - Follow-up resumption of warfarin once pt is taking POs; will need bridged with heparin d/t active DVT  Rober Minion, PharmD., MS Clinical Pharmacist Pager:  [phone] Thank you for allowing pharmacy to be part of this patients care team. 04/17/2015,8:18 AM  Addendum: Heparin was discontinued with plans to resume once patient's INR is < 2.0.  Will check another INR at 4PM and resume heparin at previous rate.  I suspect she will drop rapidly after her Vitamin K dose.  Plan: PT/INR at 4PM and  restart IV heparin at 4PM at 1600 units/hr if INR < 2.0.  Rober Minion, PharmD., MS Clinical Pharmacist Pager:  [phone] Thank you for allowing pharmacy to be part of this patients care team.

## 2015-04-17 NOTE — Anesthesia Preprocedure Evaluation (Addendum)
Anesthesia Evaluation  Patient identified by MRN, date of birth, ID band Patient awake    Reviewed: Allergy & Precautions, NPO status , Patient's Chart, lab work & pertinent test results  Airway Mallampati: III  TM Distance: <3 FB Neck ROM: Full    Dental  (+) Partial Upper, Dental Advisory Given   Pulmonary sleep apnea , PE Untreated OSA   Pulmonary exam normal breath sounds clear to auscultation       Cardiovascular hypertension, Pt. on medications + Peripheral Vascular Disease  Normal cardiovascular exam Rhythm:Regular Rate:Normal     Neuro/Psych negative neurological ROS  negative psych ROS   GI/Hepatic negative GI ROS, Neg liver ROS,   Endo/Other  Morbid obesity  Renal/GU Renal InsufficiencyRenal disease  negative genitourinary   Musculoskeletal negative musculoskeletal ROS (+)   Abdominal (+) + obese,   Peds negative pediatric ROS (+)  Hematology Chronic anticoagulation   Anesthesia Other Findings   Reproductive/Obstetrics negative OB ROS                           Anesthesia Physical Anesthesia Plan  ASA: III  Anesthesia Plan: General   Post-op Pain Management:    Induction: Intravenous  Airway Management Planned: Oral ETT  Additional Equipment:   Intra-op Plan:   Post-operative Plan: Extubation in OR  Informed Consent: I have reviewed the patients History and Physical, chart, labs and discussed the procedure including the risks, benefits and alternatives for the proposed anesthesia with the patient or authorized representative who has indicated his/her understanding and acceptance.   Dental advisory given  Plan Discussed with: CRNA and Surgeon  Anesthesia Plan Comments:        Anesthesia Quick Evaluation

## 2015-04-18 ENCOUNTER — Inpatient Hospital Stay (HOSPITAL_COMMUNITY): Payer: Medicare Other | Admitting: Anesthesiology

## 2015-04-18 ENCOUNTER — Encounter (HOSPITAL_COMMUNITY): Payer: Self-pay | Admitting: Anesthesiology

## 2015-04-18 ENCOUNTER — Encounter (HOSPITAL_COMMUNITY)
Admission: EM | Disposition: A | Discharge status: Home / Self Care | Payer: Self-pay | Source: Home / Self Care | Attending: Internal Medicine

## 2015-04-18 HISTORY — PX: CHOLECYSTECTOMY: SHX55

## 2015-04-18 LAB — CBC
HEMATOCRIT: 36.1 % (ref 36.0–46.0)
Hemoglobin: 11.9 g/dL — ABNORMAL LOW (ref 12.0–15.0)
MCH: 26.7 pg (ref 26.0–34.0)
MCHC: 33 g/dL (ref 30.0–36.0)
MCV: 81.1 fL (ref 78.0–100.0)
Platelets: 216 10*3/uL (ref 150–400)
RBC: 4.45 MIL/uL (ref 3.87–5.11)
RDW: 15.8 % — AB (ref 11.5–15.5)
WBC: 11 10*3/uL — AB (ref 4.0–10.5)

## 2015-04-18 LAB — COMPREHENSIVE METABOLIC PANEL
ALT: 152 U/L — AB (ref 14–54)
AST: 115 U/L — AB (ref 15–41)
Albumin: 2.7 g/dL — ABNORMAL LOW (ref 3.5–5.0)
Alkaline Phosphatase: 137 U/L — ABNORMAL HIGH (ref 38–126)
Anion gap: 10 (ref 5–15)
BILIRUBIN TOTAL: 1.7 mg/dL — AB (ref 0.3–1.2)
BUN: 8 mg/dL (ref 6–20)
CO2: 25 mmol/L (ref 22–32)
CREATININE: 1.02 mg/dL — AB (ref 0.44–1.00)
Calcium: 8.1 mg/dL — ABNORMAL LOW (ref 8.9–10.3)
Chloride: 102 mmol/L (ref 101–111)
GFR calc Af Amer: >60 mL/min (ref >60)
Glucose, Bld: 103 mg/dL — ABNORMAL HIGH (ref 65–99)
POTASSIUM: 3.3 mmol/L — AB (ref 3.5–5.1)
Sodium: 137 mmol/L (ref 135–145)
TOTAL PROTEIN: 6.6 g/dL (ref 6.5–8.1)

## 2015-04-18 LAB — PREPARE FRESH FROZEN PLASMA
Unit division: 0
Unit division: 0

## 2015-04-18 LAB — PROTIME-INR
INR: 1.41 (ref 0.00–1.49)
PROTHROMBIN TIME: 17.4 s — AB (ref 11.6–15.2)

## 2015-04-18 LAB — HEPARIN LEVEL (UNFRACTIONATED): Heparin Unfractionated: <0.1 IU/mL — ABNORMAL LOW (ref 0.30–0.70)

## 2015-04-18 SURGERY — LAPAROSCOPIC CHOLECYSTECTOMY
Anesthesia: General | Site: Abdomen

## 2015-04-18 MED ORDER — GLYCOPYRROLATE 0.2 MG/ML IJ SOLN
INTRAMUSCULAR | Status: AC
  Filled 2015-04-18: qty 3

## 2015-04-18 MED ORDER — GLYCOPYRROLATE 0.2 MG/ML IJ SOLN
INTRAMUSCULAR | Status: DC | PRN
Start: 2015-04-18 — End: 2015-04-18
  Administered 2015-04-18: 0.6 mg via INTRAVENOUS

## 2015-04-18 MED ORDER — NEOSTIGMINE METHYLSULFATE 10 MG/10ML IV SOLN
INTRAVENOUS | Status: DC | PRN
  Administered 2015-04-18: 4 mg via INTRAVENOUS

## 2015-04-18 MED ORDER — PROPOFOL 10 MG/ML IV BOLUS
INTRAVENOUS | Status: DC | PRN
  Administered 2015-04-18: 200 mg via INTRAVENOUS

## 2015-04-18 MED ORDER — FENTANYL CITRATE (PF) 250 MCG/5ML IJ SOLN
INTRAMUSCULAR | Status: AC
  Filled 2015-04-18: qty 5

## 2015-04-18 MED ORDER — SODIUM CHLORIDE 0.9 % IR SOLN
Status: DC | PRN
  Administered 2015-04-18 (×2): 1000 mL

## 2015-04-18 MED ORDER — MIDAZOLAM HCL 5 MG/5ML IJ SOLN
INTRAMUSCULAR | Status: DC | PRN
  Administered 2015-04-18: 2 mg via INTRAVENOUS

## 2015-04-18 MED ORDER — ONDANSETRON HCL 4 MG/2ML IJ SOLN
INTRAMUSCULAR | Status: DC | PRN
Start: 2015-04-18 — End: 2015-04-18
  Administered 2015-04-18: 4 mg via INTRAVENOUS

## 2015-04-18 MED ORDER — PROMETHAZINE HCL 25 MG/ML IJ SOLN: 6.2500 mg | INTRAMUSCULAR | Status: DC | PRN

## 2015-04-18 MED ORDER — ROCURONIUM BROMIDE 100 MG/10ML IV SOLN
INTRAVENOUS | Status: DC | PRN
  Administered 2015-04-18: 10 mg via INTRAVENOUS
  Administered 2015-04-18: 30 mg via INTRAVENOUS
  Administered 2015-04-18: 10 mg via INTRAVENOUS

## 2015-04-18 MED ORDER — NEOSTIGMINE METHYLSULFATE 10 MG/10ML IV SOLN
INTRAVENOUS | Status: AC
  Filled 2015-04-18: qty 1

## 2015-04-18 MED ORDER — ENOXAPARIN SODIUM 80 MG/0.8ML ~~LOC~~ SOLN
80.0000 mg | SUBCUTANEOUS | Status: AC
  Administered 2015-04-18 – 2015-04-19 (×2): 80 mg via SUBCUTANEOUS
  Filled 2015-04-18 (×2): qty 0.8

## 2015-04-18 MED ORDER — HYDROMORPHONE HCL 1 MG/ML IJ SOLN
0.2500 mg | INTRAMUSCULAR | Status: DC | PRN
  Administered 2015-04-18 (×2): 0.5 mg via INTRAVENOUS

## 2015-04-18 MED ORDER — OXYCODONE-ACETAMINOPHEN 5-325 MG PO TABS
1.0000 | ORAL_TABLET | ORAL | Status: DC | PRN
  Administered 2015-04-18 – 2015-04-21 (×9): 2 via ORAL
  Filled 2015-04-18 (×9): qty 2

## 2015-04-18 MED ORDER — LACTATED RINGERS IV SOLN
INTRAVENOUS | Status: DC
  Administered 2015-04-18 (×2): via INTRAVENOUS

## 2015-04-18 MED ORDER — MIDAZOLAM HCL 2 MG/2ML IJ SOLN
INTRAMUSCULAR | Status: AC
  Filled 2015-04-18: qty 2

## 2015-04-18 MED ORDER — LIDOCAINE HCL (CARDIAC) 20 MG/ML IV SOLN
INTRAVENOUS | Status: DC | PRN
  Administered 2015-04-18 (×2): 40 mg via INTRAVENOUS

## 2015-04-18 MED ORDER — SODIUM CHLORIDE 0.9 % IV SOLN
INTRAVENOUS | Status: AC
  Administered 2015-04-18: 11:00:00 via INTRAVENOUS

## 2015-04-18 MED ORDER — ONDANSETRON HCL 4 MG/2ML IJ SOLN
INTRAMUSCULAR | Status: AC
  Filled 2015-04-18: qty 2

## 2015-04-18 MED ORDER — SUCCINYLCHOLINE CHLORIDE 20 MG/ML IJ SOLN
INTRAMUSCULAR | Status: DC | PRN
  Administered 2015-04-18: 100 mg via INTRAVENOUS

## 2015-04-18 MED ORDER — 0.9 % SODIUM CHLORIDE (POUR BTL) OPTIME
TOPICAL | Status: DC | PRN
  Administered 2015-04-18: 1000 mL

## 2015-04-18 MED ORDER — PROPOFOL 10 MG/ML IV BOLUS
INTRAVENOUS | Status: AC
  Filled 2015-04-18: qty 20

## 2015-04-18 MED ORDER — HYDROMORPHONE HCL 1 MG/ML IJ SOLN
INTRAMUSCULAR | Status: AC
  Administered 2015-04-18: 0.5 mg via INTRAVENOUS
  Filled 2015-04-18: qty 1

## 2015-04-18 MED ORDER — BUPIVACAINE HCL (PF) 0.25 % IJ SOLN
INTRAMUSCULAR | Status: AC
  Filled 2015-04-18: qty 30

## 2015-04-18 MED ORDER — POTASSIUM CHLORIDE CRYS ER 20 MEQ PO TBCR
40.0000 meq | EXTENDED_RELEASE_TABLET | ORAL | Status: AC
  Administered 2015-04-18 (×2): 40 meq via ORAL
  Filled 2015-04-18 (×2): qty 2

## 2015-04-18 MED ORDER — LIDOCAINE HCL (CARDIAC) 20 MG/ML IV SOLN
INTRAVENOUS | Status: AC
  Filled 2015-04-18: qty 5

## 2015-04-18 MED ORDER — BUPIVACAINE HCL 0.25 % IJ SOLN
INTRAMUSCULAR | Status: DC | PRN
  Administered 2015-04-18: 5 mL

## 2015-04-18 MED ORDER — FENTANYL CITRATE (PF) 100 MCG/2ML IJ SOLN
INTRAMUSCULAR | Status: DC | PRN
Start: 2015-04-18 — End: 2015-04-18
  Administered 2015-04-18: 50 ug via INTRAVENOUS
  Administered 2015-04-18: 100 ug via INTRAVENOUS
  Administered 2015-04-18 (×4): 50 ug via INTRAVENOUS

## 2015-04-18 SURGICAL SUPPLY — 46 items
APL SKNCLS STERI-STRIP NONHPOA (GAUZE/BANDAGES/DRESSINGS) ×1
APPLIER CLIP 5 13 M/L LIGAMAX5 (MISCELLANEOUS) ×2
APR CLP MED LRG 5 ANG JAW (MISCELLANEOUS) ×1
BAG SPEC RTRVL 10 TROC 200 (ENDOMECHANICALS) ×1
BENZOIN TINCTURE PRP APPL 2/3 (GAUZE/BANDAGES/DRESSINGS) ×2 IMPLANT
CANISTER SUCTION 2500CC (MISCELLANEOUS) ×2 IMPLANT
CHLORAPREP W/TINT 26ML (MISCELLANEOUS) ×2 IMPLANT
CLIP APPLIE 5 13 M/L LIGAMAX5 (MISCELLANEOUS) IMPLANT
CLIP LIGATING HEMO O LOK GREEN (MISCELLANEOUS) IMPLANT
CLSR STERI-STRIP ANTIMIC 1/2X4 (GAUZE/BANDAGES/DRESSINGS) ×2 IMPLANT
COVER SURGICAL LIGHT HANDLE (MISCELLANEOUS) IMPLANT
COVER TRANSDUCER ULTRASND (DRAPES) ×2 IMPLANT
DEVICE TROCAR PUNCTURE CLOSURE (ENDOMECHANICALS) ×1 IMPLANT
ELECT REM PT RETURN 9FT ADLT (ELECTROSURGICAL) ×2
ELECTRODE REM PT RTRN 9FT ADLT (ELECTROSURGICAL) ×1 IMPLANT
ENDOLOOP SUT PDS II  0 18 (SUTURE) ×3
ENDOLOOP SUT PDS II 0 18 (SUTURE) IMPLANT
GAUZE SPONGE 2X2 8PLY STRL LF (GAUZE/BANDAGES/DRESSINGS) ×1 IMPLANT
GLOVE BIO SURGEON STRL SZ7.5 (GLOVE) ×2 IMPLANT
GOWN STRL REUS W/ TWL LRG LVL3 (GOWN DISPOSABLE) ×2 IMPLANT
GOWN STRL REUS W/ TWL XL LVL3 (GOWN DISPOSABLE) ×1 IMPLANT
GOWN STRL REUS W/TWL LRG LVL3 (GOWN DISPOSABLE) ×4
GOWN STRL REUS W/TWL XL LVL3 (GOWN DISPOSABLE) ×2
HEMOSTAT SNOW SURGICEL 2X4 (HEMOSTASIS) ×1 IMPLANT
KIT BASIN OR (CUSTOM PROCEDURE TRAY) ×2 IMPLANT
KIT ROOM TURNOVER OR (KITS) ×2 IMPLANT
NDL INSUFFLATION 14GA 120MM (NEEDLE) ×1 IMPLANT
NEEDLE INSUFFLATION 14GA 120MM (NEEDLE) ×2 IMPLANT
NS IRRIG 1000ML POUR BTL (IV SOLUTION) ×2 IMPLANT
PAD ARMBOARD 7.5X6 YLW CONV (MISCELLANEOUS) ×4 IMPLANT
POUCH RETRIEVAL ECOSAC 10 (ENDOMECHANICALS) IMPLANT
POUCH RETRIEVAL ECOSAC 10MM (ENDOMECHANICALS) ×1
PROBE LAPAROSCOPIC 5MM W/FTSWT (MISCELLANEOUS) IMPLANT
SCISSORS LAP 5X35 DISP (ENDOMECHANICALS) ×2 IMPLANT
SET IRRIG TUBING LAPAROSCOPIC (IRRIGATION / IRRIGATOR) ×2 IMPLANT
SLEEVE ENDOPATH XCEL 5M (ENDOMECHANICALS) ×2 IMPLANT
SPECIMEN JAR SMALL (MISCELLANEOUS) ×1 IMPLANT
SPONGE GAUZE 2X2 STER 10/PKG (GAUZE/BANDAGES/DRESSINGS) ×1
SUT MNCRL AB 3-0 PS2 18 (SUTURE) ×4 IMPLANT
TAPE CLOTH SOFT 2X10 (GAUZE/BANDAGES/DRESSINGS) ×1 IMPLANT
TOWEL OR 17X24 6PK STRL BLUE (TOWEL DISPOSABLE) ×1 IMPLANT
TOWEL OR 17X26 10 PK STRL BLUE (TOWEL DISPOSABLE) ×2 IMPLANT
TRAY LAPAROSCOPIC MC (CUSTOM PROCEDURE TRAY) ×2 IMPLANT
TROCAR XCEL NON-BLD 11X100MML (ENDOMECHANICALS) ×2 IMPLANT
TROCAR XCEL NON-BLD 5MMX100MML (ENDOMECHANICALS) ×2 IMPLANT
TUBING INSUFFLATION (TUBING) ×2 IMPLANT

## 2015-04-18 NOTE — Progress Notes (Signed)
Patient Demographics:    Alice [name], is a 44 y.o. female, DOB - 07-21-1970, [id number]  Admit date - 04/16/2015   Admitting Physician Mechele Claude, DO  Outpatient Primary MD for the patient is OSEI-BONSU,GEORGE, MD  LOS - 2   Chief Complaint  Patient presents with  . Chest Pain        Subjective:    Alice [name] today has, No headache, No chest pain, mild right upper quadrant abdominal pain - No Nausea, No new weakness tingling or numbness, No Cough - SOB.     Assessment  & Plan :     1. Acute cholecystitis could be secondary to gallstones. General surgery on board, continue IV ABX , IV fluids, status post laparoscopic cholecystectomy on 04/18/2015, pain control. Continue to monitor closely.   2. Mildly elevated LFTs. Could be due to #1 above, trend stable we'll continue to observe.   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 Hold Coumadin, INR reversed with vitamin K and FFP due to surgery, currently on heparin bridge with Coumadin, pharmacy dosing.   4. Morbid obesity. Outpatient follow-up with PCP post discharge.   5. Essential hypertension. Place on as needed IV Lopressor and hydralazine and monitor.     Code Status : Full  Family Communication  : None present  Disposition Plan  :  Likely home in 1-2 days  Consults  :  Gen. surgery, discussed with GI Dr. Harden Mo over the phone.  Procedures  : CT scan abdomen and pelvis, right upper quadrant ultrasound, HIDA scan. All suggestive of acute cholecystitis.  DVT Prophylaxis  :  Heparin/ Coumadin/SCD  Lab Results  Component Value Date   PLT 216 04/18/2015    Inpatient Medications  Scheduled Meds: . cefTRIAXone (ROCEPHIN)  IV  2 g Intravenous Q24H  . pantoprazole (PROTONIX) IV  40 mg  Intravenous Q12H  . potassium chloride  40 mEq Oral Q4H   Continuous Infusions: . sodium chloride 50 mL/hr at 04/18/15 1126  . lactated ringers 10 mL/hr at 04/18/15 0710   PRN Meds:.acetaminophen **OR** [DISCONTINUED] acetaminophen, hydrALAZINE, HYDROmorphone (DILAUDID) injection, metoprolol, ondansetron (ZOFRAN) IV, oxyCODONE-acetaminophen  Antibiotics  :     Anti-infectives    Start     Dose/Rate Route Frequency Ordered Stop   04/18/15 0800  cefTRIAXone (ROCEPHIN) 2 g in dextrose 5 % 50 mL IVPB    Comments:  Pharmacy may adjust dosing strength / duration / interval for maximal efficacy   2 g 100 mL/hr over 30 Minutes Intravenous Every 24 hours 04/17/15 1248     04/17/15 1230  Ampicillin-Sulbactam (UNASYN) 3 g in sodium chloride 0.9 % 100 mL IVPB  Status:  Discontinued     3 g 100 mL/hr over 60 Minutes Intravenous Every 6 hours 04/17/15 1215 04/17/15 1248   04/17/15 1000  cefTRIAXone (ROCEPHIN) 1 g in dextrose 5 % 50 mL IVPB  Status:  Discontinued     1 g 100 mL/hr over 30 Minutes Intravenous Every 24 hours 04/16/15 1439 04/17/15 1144   04/16/15 1115  cefTRIAXone (ROCEPHIN) 1 g in dextrose 5 % 50 mL IVPB  Status:  Discontinued     1 g 100 mL/hr over 30 Minutes Intravenous Every 24  hours 04/16/15 1110 04/16/15 1438        Objective:   Filed Vitals:   04/18/15 1023 04/18/15 1038 04/18/15 1050 04/18/15 1102  BP: 170/98 177/98    Pulse: 96 100  98  Temp:   98.1 F (36.7 C) 98.6 F (37 C)  TempSrc:    Oral  Resp: 21 23    Height:      Weight:      SpO2: 100% 96%      Wt Readings from Last 3 Encounters:  04/18/15 168.2 kg (370 lb 13 oz)  03/01/15 158.759 kg (350 lb)  04/05/14 158.759 kg (350 lb)     Intake/Output Summary (Last 24 hours) at 04/18/15 1150 Last data filed at 04/18/15 0942  Gross per 24 hour  Intake 2012.53 ml  Output     50 ml  Net 1962.53 ml     Physical Exam  Awake Alert, Oriented X 3, No new F.N deficits, Normal affect [place].AT,PERRAL Supple  Neck,No JVD, No cervical lymphadenopathy appriciated.  Symmetrical Chest wall movement, Good air movement bilaterally, CTAB RRR,No Gallops,Rubs or new Murmurs, No Parasternal Heave +ve B.Sounds, Abd Soft, +ve RUQ tenderness, No organomegaly appriciated, No rebound - guarding or rigidity. No Cyanosis, Clubbing or edema, No new Rash or bruise       Data Review:   Micro Results Recent Results (from the past 240 hour(s))  Surgical pcr screen     Status: None   Collection Time: 04/17/15  9:40 AM  Result Value Ref Range Status   MRSA, PCR NEGATIVE NEGATIVE Final   Staphylococcus aureus NEGATIVE NEGATIVE Final    Comment:        The Xpert SA Assay (FDA approved for NASAL specimens in patients over 15 years of age), is one component of a comprehensive surveillance program.  Test performance has been validated by [facility] for patients greater than or equal to 46 year old. It is not intended to diagnose infection nor to guide or monitor treatment.     Radiology Reports Dg Chest 2 View  04/16/2015  CLINICAL DATA:  Chest pain and nausea, onset tonight. EXAM: CHEST  2 VIEW COMPARISON:  03/01/2015 FINDINGS: The heart size and mediastinal contours are within normal limits. Both lungs are clear. The visualized skeletal structures are unremarkable. IMPRESSION: No active cardiopulmonary disease. Electronically Signed   By: Andreas Newport M.D.   On: 04/16/2015 06:22   Nm Hepato W/eject Fract  04/16/2015  CLINICAL DATA:  Abdominal pain.  Chest pain. EXAM: NUCLEAR MEDICINE HEPATOBILIARY IMAGING TECHNIQUE: Sequential images of the abdomen were obtained out to 60 minutes following intravenous administration of radiopharmaceutical. RADIOPHARMACEUTICALS:  5.0 mCi Tc-36m  Choletec IV COMPARISON:  04/16/2015 FINDINGS: Flow study shows uniform distribution of radioactivity in the liver. Sequential imaging of the liver and abdomen over a period of 60 minutes shows uniform uptake of the tracer in the  liver. There is no filling of the gallbladder at the end of 60 minutes. 6.3 mg of morphine was administered and imaging was performed for an additional 30 minutes demonstrating no filling of the gallbladder. Radiotracer uptake is present in the small bowel at minutes. At the end of 1-hour, there is relatively good clearance of activity from the liver. IMPRESSION: 1. Findings most consistent with cystic duct obstruction and cholecystitis. Electronically Signed   By: Kathreen Devoid   On: 04/16/2015 19:17   Ct Angio Abd/pel W/ And/or W/o  04/16/2015  CLINICAL DATA:  Epigastric pain, left upper quadrant  abdominal pain and lower left chest pain radiating into back. Evidence of gallbladder distention and cholelithiasis by ultrasound. EXAM: CTA ABDOMEN AND PELVIS WITHOUT CONTRAST TECHNIQUE: Multidetector CT imaging of the abdomen and pelvis was performed using the standard protocol during bolus administration of intravenous contrast. Multiplanar reconstructed images and MIPs were obtained and reviewed to evaluate the vascular anatomy. CONTRAST:  180mL OMNIPAQUE IOHEXOL 350 MG/ML SOLN COMPARISON:  Right upper quadrant abdominal ultrasound on 04/16/2015 as well as prior CT of the abdomen and pelvis on 03/01/2015. FINDINGS: The abdominal aorta is of normal caliber and shows no evidence of aneurysmal disease or atherosclerosis. Visceral branch vessels are well demonstrated with normal patency of the celiac axis, superior mesenteric artery, bilateral single renal arteries and the inferior mesenteric artery. Bilateral iliac and common femoral arteries are normally patent. Stable gallbladder distension without evidence of biliary ductal dilatation. There is a small amount of fluid in between the right kidney and the medial margin of the inferior right lobe of the liver which was not present on the prior CT. This is close to the posterior margin of the gallbladder. This is nonspecific but may implicate some degree of  cholecystitis. No other significant inflammatory changes are seen surrounding other margins of the gallbladder. 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 No evidence of bowel obstruction or perforation. No focal abscess is identified. The bladder is unremarkable. IUD again visualized in the endometrial cavity of the uterus. No hernias, focal masses or enlarged lymph nodes are seen. Bony structures show degenerative disc disease at L4-5 and L5-S1. Review of the MIP images confirms the above findings. IMPRESSION: 1. Normal abdominal aorta and visceral arteries with no evidence by CTA of mesenteric arterial occlusive disease. 2. Similar gallbladder distension to prior CT and ultrasound exams. There is a small amount of fluid posterior to the gallbladder and in between the right kidney and right lobe of the liver. This was not present on the prior CT and may implicate some regional inflammation, potentially from the gallbladder. Nuclear medicine hepatobiliary scintigraphy may be helpful in determining whether there is evidence of cholecystitis. 3. Prior gastric bypass surgery without evidence of complication or acute process involving the bowel. Electronically Signed   By: Aletta Edouard M.D.   On: 04/16/2015 15:00   US Abdomen Limited Ruq  04/16/2015  CLINICAL DATA:  Epigastric pain, history of gastric bypass, hypertension, obesity EXAM: US ABDOMEN LIMITED - RIGHT UPPER QUADRANT COMPARISON:  CT abdomen pelvis 03/01/2015 FINDINGS: Gallbladder: Large shadowing echogenic focus within gallbladder 11.2 cm length most consistent with cholelithiasis. The presence of shadowing makes this less likely to represent a large sludge ball. Gallbladder is distended, 12.9 cm length. No gallbladder wall thickening or pericholecystic fluid. Equivocal sonographic Murphy sign. Common bile duct: Diameter: 8 mm mildly dilated for age Liver: Normal appearance Image quality degraded secondary to body habitus. No RIGHT upper  quadrant free fluid. IMPRESSION: Distended gallbladder containing an 11.2 mm diameter shadowing focus most likely representing cholelithiasis. Equivocal sonographic Percell Miller sign without evidence of gallbladder wall thickening. Mild biliary dilatation for age; recommend correlation with LFTs. Electronically Signed   By: Lavonia Dana M.D.   On: 04/16/2015 08:24     CBC  Recent Labs Lab 04/16/15 0520 04/17/15 0706 04/18/15 0540  WBC 7.9 9.1 11.0*  HGB 12.5 11.8* 11.9*  HCT 37.8 35.2* 36.1  PLT 266 231 216  MCV 79.4 80.0 81.1  MCH 26.3 26.8 26.7  MCHC 33.1 33.5 33.0  RDW 15.7* 15.8* 15.8*  LYMPHSABS 1.9  --   --   MONOABS 0.4  --   --   EOSABS 0.2  --   --   BASOSABS 0.0  --   --     Chemistries   Recent Labs Lab 04/16/15 0520 04/17/15 0706 04/18/15 0540  NA 138 138 137  K 3.4* 3.4* 3.3*  CL 98* 104 102  CO2 30 25 25   GLUCOSE 120* 113* 103*  BUN 18 13 8   CREATININE 1.28* 1.13* 1.02*  CALCIUM 9.3 8.1* 8.1*  AST 27 270* 115*  ALT 22 211* 152*  ALKPHOS 87 132* 137*  BILITOT 0.6 1.2 1.7*   ------------------------------------------------------------------------------------------------------------------ estimated creatinine clearance is 118.6 mL/min (by C-G formula based on Cr of 1.02). ------------------------------------------------------------------------------------------------------------------ No results for input(s): HGBA1C in the last 72 hours. ------------------------------------------------------------------------------------------------------------------ No results for input(s): CHOL, HDL, LDLCALC, TRIG, CHOLHDL, LDLDIRECT in the last 72 hours. ------------------------------------------------------------------------------------------------------------------ No results for input(s): TSH, T4TOTAL, T3FREE, THYROIDAB in the last 72 hours.  Invalid input(s):  FREET3 ------------------------------------------------------------------------------------------------------------------ No results for input(s): VITAMINB12, FOLATE, FERRITIN, TIBC, IRON, RETICCTPCT in the last 72 hours.  Coagulation profile  Recent Labs Lab 04/16/15 0520 04/17/15 1012 04/17/15 1421 04/18/15 0540  INR 1.91* 2.10* 1.79* 1.41    No results for input(s): DDIMER in the last 72 hours.  Cardiac Enzymes No results for input(s): CKMB, TROPONINI, MYOGLOBIN in the last 168 hours.  Invalid input(s): CK ------------------------------------------------------------------------------------------------------------------ Invalid input(s): POCBNP   Time Spent in minutes 35   Nathasha Fiorillo K M.D on 04/18/2015 at 11:50 AM  Between 7am to 7pm - Pager - [phone]  After 7pm go to [email or web address] - password [facility]  [facility] -  Office  [phone]

## 2015-04-18 NOTE — Op Note (Signed)
04/18/2015  9:34 AM  PATIENT:  Alice [name]  44 y.o. female  PRE-OPERATIVE DIAGNOSIS:  Acute Cholecystitis  POST-OPERATIVE DIAGNOSIS:  Acute Cholecystitis  PROCEDURE:  Procedure(s): LAPAROSCOPIC CHOLECYSTECTOMY (N/A)  SURGEON:  Surgeon(s) and Role:    * Ralene Ok, MD - Primary  ANESTHESIA:   local and general  EBL:   15cc  BLOOD ADMINISTERED:none  DRAINS: none   LOCAL MEDICATIONS USED:  BUPIVICAINE   SPECIMEN:  Source of Specimen:  Gallbladder  DISPOSITION OF SPECIMEN:  PATHOLOGY  COUNTS:  YES  TOURNIQUET:  * No tourniquets in log *  DICTATION: .Dragon Dictation The patient was taken to the operating and placed in the supine position with bilateral SCDs in place. The patient was prepped and draped in the usual sterile fashion. A time out was called and all facts were verified. A pneumoperitoneum was obtained via A Veress needle technique to a pressure of 10mm of mercury.  A 31mm trochar was then placed in the right upper quadrant under visualization, and there were no injuries to any abdominal organs. A 11 mm port was then placed in the umbilical region after infiltrating with local anesthesia under direct visualization. A second and third epigastric port and right lower quadrant port placement under direct visualization, respectively. The liver was very fatty and large. The gallbladder was identified very inflamed, it was retracted, the peritoneum was then sharply dissected from the gallbladder and this dissection was carried down to Calot's triangle. The gallbladder was identified and stripped away circumferentially and seen going into the gallbladder 360, the critical angle was obtained. Secondary to the inflammation and the dilatation of the cystic duct a cholangiogram was abandoned. At this time the cystic duct was transected. 2 Endoloops were placed on the cystic duct stump.  The cystic artery was identified and 2 clips placed proximally and one distally and  transected. We then proceeded to remove the gallbladder off the hepatic fossa with Bovie cautery. A retrieval bag was then placed in the abdomen and gallbladder placed in the bag. The hepatic fossa was then reexamined and hemostasis was achieved with Bovie cautery and was excellent at the end of the case. The subhepatic fossa and perihepatic fossa was then irrigated until the effluent was clear. The 11 mm trocar fascia was reapproximated with the Endo Close #1 Vicrylx3. The pneumoperitoneum was evacuated and all trochars removed under direct visulalization. The skin was then closed with 4-0 Monocryl and the skin dressed with Steri-Strips, gauze, and tape. The patient was awaken from general anesthesia and taken to the recovery room in stable condition.   PLAN OF CARE: Admit to inpatient   PATIENT DISPOSITION:  PACU - hemodynamically stable.   Delay start of Pharmacological VTE agent (>24hrs) due to surgical blood loss or risk of bleeding: not applicable

## 2015-04-18 NOTE — Progress Notes (Addendum)
ANTICOAGULATION CONSULT NOTE - Follow Up  Pharmacy Consult for heparin Indication: hx of DVT (was on coumadin which was reversed)  Assessment: 44 yo female with hx of of DVT will be restarted on heparin.  Patient was previously on coumadin but was reversed with vitamin K in case for GI procedure.  INR today is 1.79.  Hgb 11.8 and Plt 231 K.  Home coumadin dose was 10 mg po daily  Heparin stopped today by surgery for Lap Cholecystectomy - plan is to resume 24 hours post procedure (per op note).   Goal of Therapy:  Heparin level 0.3-0.7 units/ml Monitor platelets by anticoagulation protocol: Yes   Plan:  - Start heparin at 1600 units/hr tomorrow morning - Will check a 6 hr heparin level with restart - daily heparin level and CBC  Rober Minion, PharmD., MS Clinical Pharmacist Pager:  [phone] Thank you for allowing pharmacy to be part of this patients care team. 04/18/2015,11:06 AM  Addendum: Asked to provide prophylactic dosing of LMWH starting today and resume bridge therapy with LMWH and Warfarin on 04/19/2014.  Will give Lovenox (weight based) dosing of 80mg  tonight and tomorrow then begin 165mg  sq every 12 hours on 12/5 and restart her Warfarin.  Rober Minion, PharmD., MS Clinical Pharmacist Pager:  [phone] Thank you for allowing pharmacy to be part of this patients care team.

## 2015-04-18 NOTE — Transfer of Care (Signed)
Immediate Anesthesia Transfer of Care Note  Patient: Alice [name]  Procedure(s) Performed: Procedure(s): LAPAROSCOPIC CHOLECYSTECTOMY (N/A)  Patient Location: PACU  Anesthesia Type:General  Level of Consciousness: awake, alert  and oriented  Airway & Oxygen Therapy: Patient Spontanous Breathing and Patient connected to nasal cannula oxygen  Post-op Assessment: Report given to RN and Post -op Vital signs reviewed and stable  Post vital signs: Reviewed and stable  Last Vitals:  Filed Vitals:   04/17/15 2050 04/18/15 0519  BP: 146/72 139/80  Pulse: 93 94  Temp: 37.6 C 37.2 C  Resp: 19 18    Complications: No apparent anesthesia complications

## 2015-04-18 NOTE — Anesthesia Postprocedure Evaluation (Signed)
Anesthesia Post Note  Patient: Alice [name]  Procedure(s) Performed: Procedure(s) (LRB): LAPAROSCOPIC CHOLECYSTECTOMY (N/A)  Patient location during evaluation: PACU Anesthesia Type: General Level of consciousness: sedated Pain management: pain level controlled Vital Signs Assessment: post-procedure vital signs reviewed and stable Respiratory status: spontaneous breathing and respiratory function stable Cardiovascular status: stable Anesthetic complications: no    Last Vitals:  Filed Vitals:   04/18/15 1009 04/18/15 1023  BP: 181/100 170/98  Pulse: 99 96  Temp:    Resp: 21 21    Last Pain:  Filed Vitals:   04/18/15 1037  PainSc: 4                  Kieon Lawhorn DANIEL

## 2015-04-18 NOTE — Anesthesia Procedure Notes (Signed)
Procedure Name: Intubation Date/Time: 04/18/2015 7:45 AM Performed by: Kyung Rudd Pre-anesthesia Checklist: Patient identified, Emergency Drugs available, Suction available, Patient being monitored and Timeout performed Patient Re-evaluated:Patient Re-evaluated prior to inductionOxygen Delivery Method: Circle system utilized Preoxygenation: Pre-oxygenation with 100% oxygen Intubation Type: IV induction Ventilation: Mask ventilation without difficulty Laryngoscope Size: Mac and 4 Grade View: Grade I Tube type: Oral Tube size: 7.0 mm Number of attempts: 1 Airway Equipment and Method: Stylet and LTA kit utilized Placement Confirmation: ETT inserted through vocal cords under direct vision,  positive ETCO2 and breath sounds checked- equal and bilateral Secured at: 21 cm Tube secured with: Tape Dental Injury: Teeth and Oropharynx as per pre-operative assessment

## 2015-04-19 ENCOUNTER — Inpatient Hospital Stay (HOSPITAL_COMMUNITY): Payer: Medicare Other

## 2015-04-19 LAB — COMPREHENSIVE METABOLIC PANEL
ALBUMIN: 2.3 g/dL — AB (ref 3.5–5.0)
ALT: 134 U/L — ABNORMAL HIGH (ref 14–54)
ANION GAP: 6 (ref 5–15)
AST: 95 U/L — ABNORMAL HIGH (ref 15–41)
Alkaline Phosphatase: 132 U/L — ABNORMAL HIGH (ref 38–126)
BILIRUBIN TOTAL: 1.9 mg/dL — AB (ref 0.3–1.2)
BUN: 9 mg/dL (ref 6–20)
CO2: 27 mmol/L (ref 22–32)
Calcium: 8.5 mg/dL — ABNORMAL LOW (ref 8.9–10.3)
Chloride: 105 mmol/L (ref 101–111)
Creatinine, Ser: 1.33 mg/dL — ABNORMAL HIGH (ref 0.44–1.00)
GFR calc Af Amer: 56 mL/min — ABNORMAL LOW (ref >60)
GFR, EST NON AFRICAN AMERICAN: 48 mL/min — AB (ref >60)
GLUCOSE: 107 mg/dL — AB (ref 65–99)
POTASSIUM: 3.9 mmol/L (ref 3.5–5.1)
Sodium: 138 mmol/L (ref 135–145)
TOTAL PROTEIN: 6.9 g/dL (ref 6.5–8.1)

## 2015-04-19 LAB — CBC
HEMATOCRIT: 35.5 % — AB (ref 36.0–46.0)
Hemoglobin: 11.7 g/dL — ABNORMAL LOW (ref 12.0–15.0)
MCH: 26.9 pg (ref 26.0–34.0)
MCHC: 33 g/dL (ref 30.0–36.0)
MCV: 81.6 fL (ref 78.0–100.0)
PLATELETS: 221 10*3/uL (ref 150–400)
RBC: 4.35 MIL/uL (ref 3.87–5.11)
RDW: 15.8 % — AB (ref 11.5–15.5)
WBC: 16.3 10*3/uL — ABNORMAL HIGH (ref 4.0–10.5)

## 2015-04-19 LAB — OSMOLALITY: OSMOLALITY: 288 mosm/kg (ref 275–295)

## 2015-04-19 LAB — SODIUM, URINE, RANDOM: SODIUM UR: 12 mmol/L

## 2015-04-19 LAB — HEPARIN LEVEL (UNFRACTIONATED): HEPARIN UNFRACTIONATED: 0.19 [IU]/mL — AB (ref 0.30–0.70)

## 2015-04-19 LAB — OSMOLALITY, URINE: OSMOLALITY UR: 435 mosm/kg (ref 300–900)

## 2015-04-19 LAB — CREATININE, URINE, RANDOM: Creatinine, Urine: 286.51 mg/dL

## 2015-04-19 MED ORDER — SODIUM CHLORIDE 0.9 % IV SOLN
INTRAVENOUS | Status: DC
  Administered 2015-04-19 – 2015-04-20 (×2): via INTRAVENOUS

## 2015-04-19 MED ORDER — METOPROLOL TARTRATE 50 MG PO TABS
50.0000 mg | ORAL_TABLET | Freq: Two times a day (BID) | ORAL | Status: DC
  Administered 2015-04-19 – 2015-04-21 (×5): 50 mg via ORAL
  Filled 2015-04-19 (×5): qty 1

## 2015-04-19 NOTE — Progress Notes (Signed)
1 Day Post-Op  Subjective: Pt with pain this AM.  Min Clears tol  Objective: Vital signs in last 24 hours: Temp:  [98 F (36.7 C)-100.2 F (37.9 C)] 99.4 F (37.4 C) (12/04 0602) Pulse Rate:  [86-121] 121 (12/04 0602) Resp:  [18-24] 21 (12/04 0602) BP: (122-181)/(55-102) 138/84 mmHg (12/04 0602) SpO2:  [94 %-100 %] 96 % (12/04 0602) Weight:  [168.8 kg (372 lb 2.2 oz)] 168.8 kg (372 lb 2.2 oz) (12/04 0602) Last BM Date: 04/16/15  Intake/Output from previous day: 12/03 0701 - 12/04 0700 In: 2594.2 [P.O.:800; I.V.:1794.2] Out: 1625 [Urine:1575; Blood:50] Intake/Output this shift:    General appearance: alert and cooperative GI: soft, approp ttp, nd  Lab Results:   Recent Labs  04/18/15 0540 04/19/15 0512  WBC 11.0* 16.3*  HGB 11.9* 11.7*  HCT 36.1 35.5*  PLT 216 221   BMET  Recent Labs  04/18/15 0540 04/19/15 0512  NA 137 138  K 3.3* 3.9  CL 102 105  CO2 25 27  GLUCOSE 103* 107*  BUN 8 9  CREATININE 1.02* 1.33*  CALCIUM 8.1* 8.5*   PT/INR  Recent Labs  04/17/15 1421 04/18/15 0540  LABPROT 20.7* 17.4*  INR 1.79* 1.41    Anti-infectives: Anti-infectives    Start     Dose/Rate Route Frequency Ordered Stop   04/18/15 0800  cefTRIAXone (ROCEPHIN) 2 g in dextrose 5 % 50 mL IVPB    Comments:  Pharmacy may adjust dosing strength / duration / interval for maximal efficacy   2 g 100 mL/hr over 30 Minutes Intravenous Every 24 hours 04/17/15 1248     04/17/15 1230  Ampicillin-Sulbactam (UNASYN) 3 g in sodium chloride 0.9 % 100 mL IVPB  Status:  Discontinued     3 g 100 mL/hr over 60 Minutes Intravenous Every 6 hours 04/17/15 1215 04/17/15 1248   04/17/15 1000  cefTRIAXone (ROCEPHIN) 1 g in dextrose 5 % 50 mL IVPB  Status:  Discontinued     1 g 100 mL/hr over 30 Minutes Intravenous Every 24 hours 04/16/15 1439 04/17/15 1144   04/16/15 1115  cefTRIAXone (ROCEPHIN) 1 g in dextrose 5 % 50 mL IVPB  Status:  Discontinued     1 g 100 mL/hr over 30 Minutes  Intravenous Every 24 hours 04/16/15 1110 04/16/15 1438      Assessment/Plan: s/p Procedure(s): LAPAROSCOPIC CHOLECYSTECTOMY (N/A) Advance diet as tol Pain mgmt Con't abx Mobilize Hopefully home Mon  LOS: 3 days    Alice Jacks., Alice [name] 04/19/2015

## 2015-04-19 NOTE — Progress Notes (Signed)
Patient Demographics:    Alice [name], is a 44 y.o. female, DOB - 1970-08-23, [id number]  Admit date - 04/16/2015   Admitting Physician Mechele Claude, DO  Outpatient Primary MD for the patient is OSEI-BONSU,GEORGE, MD  LOS - 3   Chief Complaint  Patient presents with  . Chest Pain        Subjective:    Alice [name] today has, No headache, No chest pain, mild right upper quadrant abdominal pain - No Nausea, No new weakness tingling or numbness, No Cough - SOB.     Assessment  & Plan :     1. Acute cholecystitis could be secondary to gallstones. General surgery on board, continue IV ABX , IV fluids, status post laparoscopic cholecystectomy on 04/18/2015, continue pain control. Per Gen. surgery advance diet and monitor likely discharge in the morning.   2. Mildly elevated LFTs. Could be due to #1 above, trend stable we'll continue to observe.   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 Hold Coumadin, INR reversed with vitamin K and FFP due to surgery, currently on Lovenox bridge with Coumadin, pharmacy dosing.   4. Morbid obesity. Outpatient follow-up with PCP post discharge.   5. Essential hypertension. Place on oral Lopressor along with as needed IV Lopressor and hydralazine and monitor.    Code Status : Full  Family Communication  : Sister and cousin bedside  Disposition Plan  :  Likely home in 1-2 days  Consults  :  Gen. surgery, discussed with GI Dr. Penelope Coop over the phone.  Procedures  : CT scan abdomen and pelvis, right upper quadrant ultrasound, HIDA scan. All suggestive of acute cholecystitis.  DVT Prophylaxis  :  Lovenox/ Coumadin/SCD  Lab Results  Component Value Date   PLT 221 04/19/2015    Inpatient Medications  Scheduled Meds: .  cefTRIAXone (ROCEPHIN)  IV  2 g Intravenous Q24H  . enoxaparin (LOVENOX) injection  80 mg Subcutaneous Q24H  . pantoprazole (PROTONIX) IV  40 mg Intravenous Q12H   Continuous Infusions: . sodium chloride 75 mL/hr at 04/19/15 0757   PRN Meds:.acetaminophen **OR** [DISCONTINUED] acetaminophen, hydrALAZINE, HYDROmorphone (DILAUDID) injection, metoprolol, ondansetron (ZOFRAN) IV, oxyCODONE-acetaminophen  Antibiotics  :     Anti-infectives    Start     Dose/Rate Route Frequency Ordered Stop   04/18/15 0800  cefTRIAXone (ROCEPHIN) 2 g in dextrose 5 % 50 mL IVPB    Comments:  Pharmacy may adjust dosing strength / duration / interval for maximal efficacy   2 g 100 mL/hr over 30 Minutes Intravenous Every 24 hours 04/17/15 1248     04/17/15 1230  Ampicillin-Sulbactam (UNASYN) 3 g in sodium chloride 0.9 % 100 mL IVPB  Status:  Discontinued     3 g 100 mL/hr over 60 Minutes Intravenous Every 6 hours 04/17/15 1215 04/17/15 1248   04/17/15 1000  cefTRIAXone (ROCEPHIN) 1 g in dextrose 5 % 50 mL IVPB  Status:  Discontinued     1 g 100 mL/hr over 30 Minutes Intravenous Every 24 hours 04/16/15 1439 04/17/15 1144   04/16/15 1115  cefTRIAXone (ROCEPHIN) 1 g in dextrose 5 % 50 mL IVPB  Status:  Discontinued     1 g 100 mL/hr  over 30 Minutes Intravenous Every 24 hours 04/16/15 1110 04/16/15 1438        Objective:   Filed Vitals:   04/18/15 1913 04/18/15 2155 04/19/15 0211 04/19/15 0602  BP: 148/81 132/84 122/79 138/84  Pulse: 86 116 93 121  Temp:  99.3 F (37.4 C) 98.6 F (37 C) 99.4 F (37.4 C)  TempSrc:  Oral Oral Oral  Resp:  19 18 21   Height:      Weight:    168.8 kg (372 lb 2.2 oz)  SpO2:  94% 96% 96%    Wt Readings from Last 3 Encounters:  04/19/15 168.8 kg (372 lb 2.2 oz)  03/01/15 158.759 kg (350 lb)  04/05/14 158.759 kg (350 lb)     Intake/Output Summary (Last 24 hours) at 04/19/15 1126 Last data filed at 04/19/15 1109  Gross per 24 hour  Intake 1814.16 ml  Output   1500  ml  Net 314.16 ml     Physical Exam  Awake Alert, Oriented X 3, No new F.N deficits, Normal affect [place].AT,PERRAL Supple Neck,No JVD, No cervical lymphadenopathy appriciated.  Symmetrical Chest wall movement, Good air movement bilaterally, CTAB RRR,No Gallops,Rubs or new Murmurs, No Parasternal Heave +ve B.Sounds, Abd Soft, cholecystectomy scars stable under bandage, No organomegaly appriciated, No rebound - guarding or rigidity. No Cyanosis, Clubbing or edema, No new Rash or bruise       Data Review:   Micro Results Recent Results (from the past 240 hour(s))  Surgical pcr screen     Status: None   Collection Time: 04/17/15  9:40 AM  Result Value Ref Range Status   MRSA, PCR NEGATIVE NEGATIVE Final   Staphylococcus aureus NEGATIVE NEGATIVE Final    Comment:        The Xpert SA Assay (FDA approved for NASAL specimens in patients over 67 years of age), is one component of a comprehensive surveillance program.  Test performance has been validated by [facility] for patients greater than or equal to 8 year old. It is not intended to diagnose infection nor to guide or monitor treatment.     Radiology Reports Dg Chest 2 View  04/16/2015  CLINICAL DATA:  Chest pain and nausea, onset tonight. EXAM: CHEST  2 VIEW COMPARISON:  03/01/2015 FINDINGS: The heart size and mediastinal contours are within normal limits. Both lungs are clear. The visualized skeletal structures are unremarkable. IMPRESSION: No active cardiopulmonary disease. Electronically Signed   By: Andreas Newport M.D.   On: 04/16/2015 06:22   Nm Hepato W/eject Fract  04/16/2015  CLINICAL DATA:  Abdominal pain.  Chest pain. EXAM: NUCLEAR MEDICINE HEPATOBILIARY IMAGING TECHNIQUE: Sequential images of the abdomen were obtained out to 60 minutes following intravenous administration of radiopharmaceutical. RADIOPHARMACEUTICALS:  5.0 mCi Tc-73m  Choletec IV COMPARISON:  04/16/2015 FINDINGS: Flow study shows uniform  distribution of radioactivity in the liver. Sequential imaging of the liver and abdomen over a period of 60 minutes shows uniform uptake of the tracer in the liver. There is no filling of the gallbladder at the end of 60 minutes. 6.3 mg of morphine was administered and imaging was performed for an additional 30 minutes demonstrating no filling of the gallbladder. Radiotracer uptake is present in the small bowel at minutes. At the end of 1-hour, there is relatively good clearance of activity from the liver. IMPRESSION: 1. Findings most consistent with cystic duct obstruction and cholecystitis. Electronically Signed   By: Kathreen Devoid   On: 04/16/2015 19:17   Ct Angio Abd/pel  W/ And/or W/o  04/16/2015  CLINICAL DATA:  Epigastric pain, left upper quadrant abdominal pain and lower left chest pain radiating into back. Evidence of gallbladder distention and cholelithiasis by ultrasound. EXAM: CTA ABDOMEN AND PELVIS WITHOUT CONTRAST TECHNIQUE: Multidetector CT imaging of the abdomen and pelvis was performed using the standard protocol during bolus administration of intravenous contrast. Multiplanar reconstructed images and MIPs were obtained and reviewed to evaluate the vascular anatomy. CONTRAST:  147mL OMNIPAQUE IOHEXOL 350 MG/ML SOLN COMPARISON:  Right upper quadrant abdominal ultrasound on 04/16/2015 as well as prior CT of the abdomen and pelvis on 03/01/2015. FINDINGS: The abdominal aorta is of normal caliber and shows no evidence of aneurysmal disease or atherosclerosis. Visceral branch vessels are well demonstrated with normal patency of the celiac axis, superior mesenteric artery, bilateral single renal arteries and the inferior mesenteric artery. Bilateral iliac and common femoral arteries are normally patent. Stable gallbladder distension without evidence of biliary ductal dilatation. There is a small amount of fluid in between the right kidney and the medial margin of the inferior right lobe of the liver  which was not present on the prior CT. This is close to the posterior margin of the gallbladder. This is nonspecific but may implicate some degree of cholecystitis. No other significant inflammatory changes are seen surrounding other margins of the gallbladder. 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 No evidence of bowel obstruction or perforation. No focal abscess is identified. The bladder is unremarkable. IUD again visualized in the endometrial cavity of the uterus. No hernias, focal masses or enlarged lymph nodes are seen. Bony structures show degenerative disc disease at L4-5 and L5-S1. Review of the MIP images confirms the above findings. IMPRESSION: 1. Normal abdominal aorta and visceral arteries with no evidence by CTA of mesenteric arterial occlusive disease. 2. Similar gallbladder distension to prior CT and ultrasound exams. There is a small amount of fluid posterior to the gallbladder and in between the right kidney and right lobe of the liver. This was not present on the prior CT and may implicate some regional inflammation, potentially from the gallbladder. Nuclear medicine hepatobiliary scintigraphy may be helpful in determining whether there is evidence of cholecystitis. 3. Prior gastric bypass surgery without evidence of complication or acute process involving the bowel. Electronically Signed   By: Aletta Edouard M.D.   On: 04/16/2015 15:00   US Abdomen Limited Ruq  04/16/2015  CLINICAL DATA:  Epigastric pain, history of gastric bypass, hypertension, obesity EXAM: US ABDOMEN LIMITED - RIGHT UPPER QUADRANT COMPARISON:  CT abdomen pelvis 03/01/2015 FINDINGS: Gallbladder: Large shadowing echogenic focus within gallbladder 11.2 cm length most consistent with cholelithiasis. The presence of shadowing makes this less likely to represent a large sludge ball. Gallbladder is distended, 12.9 cm length. No gallbladder wall thickening or pericholecystic fluid. Equivocal sonographic Murphy sign.  Common bile duct: Diameter: 8 mm mildly dilated for age Liver: Normal appearance Image quality degraded secondary to body habitus. No RIGHT upper quadrant free fluid. IMPRESSION: Distended gallbladder containing an 11.2 mm diameter shadowing focus most likely representing cholelithiasis. Equivocal sonographic Percell Miller sign without evidence of gallbladder wall thickening. Mild biliary dilatation for age; recommend correlation with LFTs. Electronically Signed   By: Lavonia Dana M.D.   On: 04/16/2015 08:24     CBC  Recent Labs Lab 04/16/15 0520 04/17/15 0706 04/18/15 0540 04/19/15 0512  WBC 7.9 9.1 11.0* 16.3*  HGB 12.5 11.8* 11.9* 11.7*  HCT 37.8 35.2* 36.1 35.5*  PLT 266 231 216 221  MCV 79.4 80.0 81.1 81.6  MCH 26.3 26.8 26.7 26.9  MCHC 33.1 33.5 33.0 33.0  RDW 15.7* 15.8* 15.8* 15.8*  LYMPHSABS 1.9  --   --   --   MONOABS 0.4  --   --   --   EOSABS 0.2  --   --   --   BASOSABS 0.0  --   --   --     Chemistries   Recent Labs Lab 04/16/15 0520 04/17/15 0706 04/18/15 0540 04/19/15 0512  NA 138 138 137 138  K 3.4* 3.4* 3.3* 3.9  CL 98* 104 102 105  CO2 30 25 25 27   GLUCOSE 120* 113* 103* 107*  BUN 18 13 8 9   CREATININE 1.28* 1.13* 1.02* 1.33*  CALCIUM 9.3 8.1* 8.1* 8.5*  AST 27 270* 115* 95*  ALT 22 211* 152* 134*  ALKPHOS 87 132* 137* 132*  BILITOT 0.6 1.2 1.7* 1.9*   ------------------------------------------------------------------------------------------------------------------ estimated creatinine clearance is 91.2 mL/min (by C-G formula based on Cr of 1.33). ------------------------------------------------------------------------------------------------------------------ No results for input(s): HGBA1C in the last 72 hours. ------------------------------------------------------------------------------------------------------------------ No results for input(s): CHOL, HDL, LDLCALC, TRIG, CHOLHDL, LDLDIRECT in the last 72  hours. ------------------------------------------------------------------------------------------------------------------ No results for input(s): TSH, T4TOTAL, T3FREE, THYROIDAB in the last 72 hours.  Invalid input(s): FREET3 ------------------------------------------------------------------------------------------------------------------ No results for input(s): VITAMINB12, FOLATE, FERRITIN, TIBC, IRON, RETICCTPCT in the last 72 hours.  Coagulation profile  Recent Labs Lab 04/16/15 0520 04/17/15 1012 04/17/15 1421 04/18/15 0540  INR 1.91* 2.10* 1.79* 1.41    No results for input(s): DDIMER in the last 72 hours.  Cardiac Enzymes No results for input(s): CKMB, TROPONINI, MYOGLOBIN in the last 168 hours.  Invalid input(s): CK ------------------------------------------------------------------------------------------------------------------ Invalid input(s): POCBNP   Time Spent in minutes 35   Nevaeha Finerty K M.D on 04/19/2015 at 11:26 AM  Between 7am to 7pm - Pager - [phone]  After 7pm go to [email or web address] - password [facility]  [facility] -  Office  [phone]

## 2015-04-19 NOTE — Progress Notes (Signed)
RN tried to get patient up to walk. Patient refused," I'm in too much pain to walk." Patient asked to describe her pain. Patient  described pain as sharp and radiating to her shoulder area. Patient informed that these may be gas pains and that walking may help. Patient continued to refused.

## 2015-04-20 ENCOUNTER — Encounter (HOSPITAL_COMMUNITY): Payer: Self-pay | Admitting: General Surgery

## 2015-04-20 LAB — COMPREHENSIVE METABOLIC PANEL
ALK PHOS: 136 U/L — AB (ref 38–126)
ALT: 99 U/L — AB (ref 14–54)
AST: 58 U/L — ABNORMAL HIGH (ref 15–41)
Albumin: 2.3 g/dL — ABNORMAL LOW (ref 3.5–5.0)
Anion gap: 8 (ref 5–15)
BUN: 9 mg/dL (ref 6–20)
CALCIUM: 8.6 mg/dL — AB (ref 8.9–10.3)
CHLORIDE: 105 mmol/L (ref 101–111)
CO2: 24 mmol/L (ref 22–32)
CREATININE: 1.23 mg/dL — AB (ref 0.44–1.00)
GFR, EST NON AFRICAN AMERICAN: 53 mL/min — AB (ref >60)
Glucose, Bld: 86 mg/dL (ref 65–99)
Potassium: 4.2 mmol/L (ref 3.5–5.1)
Sodium: 137 mmol/L (ref 135–145)
Total Bilirubin: 1.2 mg/dL (ref 0.3–1.2)
Total Protein: 7.3 g/dL (ref 6.5–8.1)

## 2015-04-20 LAB — CBC
HCT: 35.7 % — ABNORMAL LOW (ref 36.0–46.0)
Hemoglobin: 12.1 g/dL (ref 12.0–15.0)
MCH: 27.4 pg (ref 26.0–34.0)
MCHC: 33.9 g/dL (ref 30.0–36.0)
MCV: 81 fL (ref 78.0–100.0)
PLATELETS: 280 10*3/uL (ref 150–400)
RBC: 4.41 MIL/uL (ref 3.87–5.11)
RDW: 16 % — ABNORMAL HIGH (ref 11.5–15.5)
WBC: 15.6 10*3/uL — ABNORMAL HIGH (ref 4.0–10.5)

## 2015-04-20 MED ORDER — PROMETHAZINE HCL 25 MG/ML IJ SOLN
12.5000 mg | Freq: Four times a day (QID) | INTRAMUSCULAR | Status: DC | PRN
  Administered 2015-04-20: 12.5 mg via INTRAVENOUS
  Filled 2015-04-20: qty 1

## 2015-04-20 MED ORDER — WARFARIN SODIUM 5 MG PO TABS
10.0000 mg | ORAL_TABLET | Freq: Once | ORAL | Status: AC
  Administered 2015-04-20: 10 mg via ORAL
  Filled 2015-04-20: qty 2

## 2015-04-20 MED ORDER — DOCUSATE SODIUM 100 MG PO CAPS
100.0000 mg | ORAL_CAPSULE | Freq: Two times a day (BID) | ORAL | Status: DC
  Administered 2015-04-20 – 2015-04-21 (×3): 100 mg via ORAL
  Filled 2015-04-20 (×3): qty 1

## 2015-04-20 MED ORDER — ENOXAPARIN SODIUM 150 MG/ML ~~LOC~~ SOLN
1.0000 mg/kg | Freq: Two times a day (BID) | SUBCUTANEOUS | Status: DC
  Administered 2015-04-20 (×2): 170 mg via SUBCUTANEOUS
  Filled 2015-04-20 (×4): qty 1.13

## 2015-04-20 MED ORDER — WARFARIN - PHARMACIST DOSING INPATIENT: Freq: Every day | Status: DC

## 2015-04-20 MED ORDER — POLYETHYLENE GLYCOL 3350 17 G PO PACK
17.0000 g | PACK | Freq: Every day | ORAL | Status: DC
  Administered 2015-04-20 – 2015-04-21 (×2): 17 g via ORAL
  Filled 2015-04-20 (×2): qty 1

## 2015-04-20 NOTE — Progress Notes (Signed)
ANTICOAGULATION CONSULT NOTE - Follow Up Consult  Pharmacy Consult for Coumadin and Lovenox Indication: Hx of DVT  Allergies  Allergen Reactions  . Oxycodone Hcl Nausea Only    Patient Measurements: Height: 5\' 8"  (172.7 cm) (03/01/2015) Weight: (!) 374 lb 9 oz (169.9 kg) IBW/kg (Calculated) : 63.9  Vital Signs: Temp: 98.3 F (36.8 C) (12/05 0506) Temp Source: Oral (12/05 0506) BP: 117/62 mmHg (12/05 0506) Pulse Rate: 90 (12/05 0506)  Labs:  Recent Labs  04/17/15 1012 04/17/15 1421 04/17/15 2159  04/18/15 0540 04/19/15 0512 04/20/15 0416  HGB  --   --   --   < > 11.9* 11.7* 12.1  HCT  --   --   --   --  36.1 35.5* 35.7*  PLT  --   --   --   --  216 221 280  LABPROT 23.4* 20.7*  --   --  17.4*  --   --   INR 2.10* 1.79*  --   --  1.41  --   --   HEPARINUNFRC  --   --  0.34  --  <0.10* 0.19*  --   CREATININE  --   --   --   --  1.02* 1.33* 1.23*  < > = values in this interval not displayed.  Estimated Creatinine Clearance: 99 mL/min (by C-G formula based on Cr of 1.23).   Assessment: 44 yo female presenting with complaints of chest pain since last night. Pt has been seen for similar pain a few weeks ago and was told it was GERD. Found to have acute cholecystitis.   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 She was on coumadin 10mg  daily PTA and came in with an INR of 1.91. Coumadin held - given 5mg  Vit. K. Had cholecystectomy on 12/3 and now to restart coumadin today with enoxaparin bridge. Last INR was on 12.3 at 1.41. Had received two doses of prophylactic LMWH over the weekend. Last dose of enoxaparin was 2100 on 12/4. Hgb stable, plts wnl. No s/s of bleed.  Goal of Therapy:  INR 2-3 Monitor platelets by anticoagulation protocol: Yes   Plan:  Start enoxaparin 170mg  [place] Q12 Restart coumadin at 10mg  PO x 1 tonight  Monitor daily INR, CBC, s/s of bleed  Elenor Quinones, PharmD, [facility] Clinical Pharmacist Pager [phone] 04/20/2015 7:41 AM

## 2015-04-20 NOTE — Care Management Note (Signed)
Case Management Note  Patient Details  Name: Alice [name] MRN: [id number] Date of Birth: November 14, 1970  Subjective/Objective:                    Action/Plan:   Expected Discharge Date:                  Expected Discharge Plan:  Home/Self Care  In-House Referral:     Discharge planning Services     Post Acute Care Choice:    Choice offered to:     DME Arranged:    DME Agency:     HH Arranged:    [place] Agency:     Status of Service:  In process, will continue to follow  Medicare Important Message Given:  Yes Date Medicare IM Given:    Medicare IM give by:    Date Additional Medicare IM Given:    Additional Medicare Important Message give by:     If discussed at [place] of Stay Meetings, dates discussed:    Additional Comments: UR updated  Marilu Favre, RN 04/20/2015, 3:01 PM

## 2015-04-20 NOTE — Progress Notes (Signed)
Patient Demographics:    Alice [name], is a 44 y.o. female, DOB - [birth year], [id number]  Admit date - 04/16/2015   Admitting Physician Mechele Claude, DO  Outpatient Primary MD for the patient is OSEI-BONSU,GEORGE, MD  LOS - 4   Summary  This is a 44 year old morbidly obese African-American female with history of  DVT in the past, essential hypertension was admitted to the hospital with acute cholecystitis, she was seen by general surgery underwent laparoscopic cholecystectomy on 04/18/2015. She has tolerated the procedure well, per general surgery she is to be kept one more day as she still has mild nausea and has been relatively immobile since admission.   Chief Complaint  Patient presents with  . Chest Pain        Subjective:    Alice [name] today has, No headache, No chest pain, mild right upper quadrant abdominal pain - No Nausea, No new weakness tingling or numbness, No Cough - SOB.     Assessment  & Plan :     1. Acute cholecystitis could be secondary to gallstones. General surgery on board, underwent laparoscopic cholecystectomy on 04/18/2015, per general surgery continue IV ABX , continue pain control and supportive care, advance diet and activity. Likely discharge in the morning. Trend CBC.   2. Mildly elevated LFTs. Could be due to #1 above, trend stable stop checking.   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 Hold Coumadin, INR reversed with vitamin K and FFP due to surgery, currently on Lovenox bridge with Coumadin, pharmacy dosing.   4. Morbid obesity. Outpatient follow-up with PCP post discharge.   5. Essential hypertension. Placed on oral Lopressor along with as needed IV Lopressor and hydralazine and monitor.    Code Status : Full  Family  Communication  : Sister and cousin bedside  Disposition Plan  :  Likely home in 1-2 days  Consults  :  Gen. surgery, discussed with GI Dr. Penelope Coop over the phone.  Procedures  : CT scan abdomen and pelvis, right upper quadrant ultrasound, HIDA scan. All suggestive of acute cholecystitis.  DVT Prophylaxis  :  Lovenox/ Coumadin/SCD  Lab Results  Component Value Date   PLT 280 04/20/2015   Lab Results  Component Value Date   INR 1.41 04/18/2015   INR 1.79* 04/17/2015   INR 2.10* 04/17/2015     Inpatient Medications  Scheduled Meds: . cefTRIAXone (ROCEPHIN)  IV  2 g Intravenous Q24H  . docusate sodium  100 mg Oral BID  . enoxaparin (LOVENOX) injection  1 mg/kg Subcutaneous Q12H  . metoprolol tartrate  50 mg Oral BID  . pantoprazole (PROTONIX) IV  40 mg Intravenous Q12H  . polyethylene glycol  17 g Oral Daily  . warfarin  10 mg Oral ONCE-1800  . Warfarin - Pharmacist Dosing Inpatient   Does not apply q1800   Continuous Infusions:   PRN Meds:.acetaminophen **OR** [DISCONTINUED] acetaminophen, hydrALAZINE, HYDROmorphone (DILAUDID) injection, metoprolol, ondansetron (ZOFRAN) IV, oxyCODONE-acetaminophen  Antibiotics  :     Anti-infectives    Start     Dose/Rate Route Frequency Ordered Stop   04/18/15 0800  cefTRIAXone (ROCEPHIN) 2 g in dextrose 5 % 50 mL IVPB    Comments:  Pharmacy  may adjust dosing strength / duration / interval for maximal efficacy   2 g 100 mL/hr over 30 Minutes Intravenous Every 24 hours 04/17/15 1248     04/17/15 1230  Ampicillin-Sulbactam (UNASYN) 3 g in sodium chloride 0.9 % 100 mL IVPB  Status:  Discontinued     3 g 100 mL/hr over 60 Minutes Intravenous Every 6 hours 04/17/15 1215 04/17/15 1248   04/17/15 1000  cefTRIAXone (ROCEPHIN) 1 g in dextrose 5 % 50 mL IVPB  Status:  Discontinued     1 g 100 mL/hr over 30 Minutes Intravenous Every 24 hours 04/16/15 1439 04/17/15 1144   04/16/15 1115  cefTRIAXone (ROCEPHIN) 1 g in dextrose 5 % 50 mL IVPB   Status:  Discontinued     1 g 100 mL/hr over 30 Minutes Intravenous Every 24 hours 04/16/15 1110 04/16/15 1438        Objective:   Filed Vitals:   04/19/15 0602 04/19/15 1342 04/19/15 2112 04/20/15 0506  BP: 138/84 124/72 129/76 117/62  Pulse: 121 102 102 90  Temp: 99.4 F (37.4 C) 98.9 F (37.2 C) 97.6 F (36.4 C) 98.3 F (36.8 C)  TempSrc: Oral Oral Oral Oral  Resp: 21 18 20 19   Height:      Weight: 168.8 kg (372 lb 2.2 oz)   169.9 kg (374 lb 9 oz)  SpO2: 96% 97% 95% 94%    Wt Readings from Last 3 Encounters:  04/20/15 169.9 kg (374 lb 9 oz)  03/01/15 158.759 kg (350 lb)  04/05/14 158.759 kg (350 lb)     Intake/Output Summary (Last 24 hours) at 04/20/15 1055 Last data filed at 04/20/15 0900  Gross per 24 hour  Intake 2658.75 ml  Output   1275 ml  Net 1383.75 ml     Physical Exam  Awake Alert, Oriented X 3, No new F.N deficits, Normal affect [place].AT,PERRAL Supple Neck,No JVD, No cervical lymphadenopathy appriciated.  Symmetrical Chest wall movement, Good air movement bilaterally, CTAB RRR,No Gallops,Rubs or new Murmurs, No Parasternal Heave +ve B.Sounds, Abd Soft, cholecystectomy scars stable under bandage, No organomegaly appriciated, No rebound - guarding or rigidity. No Cyanosis, Clubbing or edema, No new Rash or bruise       Data Review:   Micro Results Recent Results (from the past 240 hour(s))  Surgical pcr screen     Status: None   Collection Time: 04/17/15  9:40 AM  Result Value Ref Range Status   MRSA, PCR NEGATIVE NEGATIVE Final   Staphylococcus aureus NEGATIVE NEGATIVE Final    Comment:        The Xpert SA Assay (FDA approved for NASAL specimens in patients over 56 years of age), is one component of a comprehensive surveillance program.  Test performance has been validated by [facility] for patients greater than or equal to 3 year old. It is not intended to diagnose infection nor to guide or monitor treatment.     Radiology  Reports Dg Chest 2 View  04/16/2015  CLINICAL DATA:  Chest pain and nausea, onset tonight. EXAM: CHEST  2 VIEW COMPARISON:  03/01/2015 FINDINGS: The heart size and mediastinal contours are within normal limits. Both lungs are clear. The visualized skeletal structures are unremarkable. IMPRESSION: No active cardiopulmonary disease. Electronically Signed   By: Andreas Newport M.D.   On: 04/16/2015 06:22   Nm Hepato W/eject Fract  04/16/2015  CLINICAL DATA:  Abdominal pain.  Chest pain. EXAM: NUCLEAR MEDICINE HEPATOBILIARY IMAGING TECHNIQUE: Sequential images of the abdomen  were obtained out to 60 minutes following intravenous administration of radiopharmaceutical. RADIOPHARMACEUTICALS:  5.0 mCi Tc-29m  Choletec IV COMPARISON:  04/16/2015 FINDINGS: Flow study shows uniform distribution of radioactivity in the liver. Sequential imaging of the liver and abdomen over a period of 60 minutes shows uniform uptake of the tracer in the liver. There is no filling of the gallbladder at the end of 60 minutes. 6.3 mg of morphine was administered and imaging was performed for an additional 30 minutes demonstrating no filling of the gallbladder. Radiotracer uptake is present in the small bowel at minutes. At the end of 1-hour, there is relatively good clearance of activity from the liver. IMPRESSION: 1. Findings most consistent with cystic duct obstruction and cholecystitis. Electronically Signed   By: Kathreen Devoid   On: 04/16/2015 19:17   Dg Chest Port 1 View  04/19/2015  CLINICAL DATA:  Shortness of breath EXAM: PORTABLE CHEST 1 VIEW COMPARISON:  04/16/2015 FINDINGS: Low lung volumes. No focal consolidation. No pleural effusion or pneumothorax. Cardiomegaly. IMPRESSION: No evidence of acute cardiopulmonary disease. Electronically Signed   By: Julian Hy M.D.   On: 04/19/2015 16:31   Ct Angio Abd/pel W/ And/or W/o  04/16/2015  CLINICAL DATA:  Epigastric pain, left upper quadrant abdominal pain and lower left  chest pain radiating into back. Evidence of gallbladder distention and cholelithiasis by ultrasound. EXAM: CTA ABDOMEN AND PELVIS WITHOUT CONTRAST TECHNIQUE: Multidetector CT imaging of the abdomen and pelvis was performed using the standard protocol during bolus administration of intravenous contrast. Multiplanar reconstructed images and MIPs were obtained and reviewed to evaluate the vascular anatomy. CONTRAST:  188mL OMNIPAQUE IOHEXOL 350 MG/ML SOLN COMPARISON:  Right upper quadrant abdominal ultrasound on 04/16/2015 as well as prior CT of the abdomen and pelvis on 03/01/2015. FINDINGS: The abdominal aorta is of normal caliber and shows no evidence of aneurysmal disease or atherosclerosis. Visceral branch vessels are well demonstrated with normal patency of the celiac axis, superior mesenteric artery, bilateral single renal arteries and the inferior mesenteric artery. Bilateral iliac and common femoral arteries are normally patent. Stable gallbladder distension without evidence of biliary ductal dilatation. There is a small amount of fluid in between the right kidney and the medial margin of the inferior right lobe of the liver which was not present on the prior CT. This is close to the posterior margin of the gallbladder. This is nonspecific but may implicate some degree of cholecystitis. No other significant inflammatory changes are seen surrounding other margins of the gallbladder. 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 No evidence of bowel obstruction or perforation. No focal abscess is identified. The bladder is unremarkable. IUD again visualized in the endometrial cavity of the uterus. No hernias, focal masses or enlarged lymph nodes are seen. Bony structures show degenerative disc disease at L4-5 and L5-S1. Review of the MIP images confirms the above findings. IMPRESSION: 1. Normal abdominal aorta and visceral arteries with no evidence by CTA of mesenteric arterial occlusive disease. 2. Similar  gallbladder distension to prior CT and ultrasound exams. There is a small amount of fluid posterior to the gallbladder and in between the right kidney and right lobe of the liver. This was not present on the prior CT and may implicate some regional inflammation, potentially from the gallbladder. Nuclear medicine hepatobiliary scintigraphy may be helpful in determining whether there is evidence of cholecystitis. 3. Prior gastric bypass surgery without evidence of complication or acute process involving the bowel. Electronically Signed   By: Eulas Post  Kathlene Cote M.D.   On: 04/16/2015 15:00   US Abdomen Limited Ruq  04/16/2015  CLINICAL DATA:  Epigastric pain, history of gastric bypass, hypertension, obesity EXAM: US ABDOMEN LIMITED - RIGHT UPPER QUADRANT COMPARISON:  CT abdomen pelvis 03/01/2015 FINDINGS: Gallbladder: Large shadowing echogenic focus within gallbladder 11.2 cm length most consistent with cholelithiasis. The presence of shadowing makes this less likely to represent a large sludge ball. Gallbladder is distended, 12.9 cm length. No gallbladder wall thickening or pericholecystic fluid. Equivocal sonographic Murphy sign. Common bile duct: Diameter: 8 mm mildly dilated for age Liver: Normal appearance Image quality degraded secondary to body habitus. No RIGHT upper quadrant free fluid. IMPRESSION: Distended gallbladder containing an 11.2 mm diameter shadowing focus most likely representing cholelithiasis. Equivocal sonographic Percell Miller sign without evidence of gallbladder wall thickening. Mild biliary dilatation for age; recommend correlation with LFTs. Electronically Signed   By: Lavonia Dana M.D.   On: 04/16/2015 08:24     CBC  Recent Labs Lab 04/16/15 0520 04/17/15 0706 04/18/15 0540 04/19/15 0512 04/20/15 0416  WBC 7.9 9.1 11.0* 16.3* 15.6*  HGB 12.5 11.8* 11.9* 11.7* 12.1  HCT 37.8 35.2* 36.1 35.5* 35.7*  PLT 266 231 216 221 280  MCV 79.4 80.0 81.1 81.6 81.0  MCH 26.3 26.8 26.7 26.9 27.4    MCHC 33.1 33.5 33.0 33.0 33.9  RDW 15.7* 15.8* 15.8* 15.8* 16.0*  LYMPHSABS 1.9  --   --   --   --   MONOABS 0.4  --   --   --   --   EOSABS 0.2  --   --   --   --   BASOSABS 0.0  --   --   --   --     Chemistries   Recent Labs Lab 04/16/15 0520 04/17/15 0706 04/18/15 0540 04/19/15 0512 04/20/15 0416  NA 138 138 137 138 137  K 3.4* 3.4* 3.3* 3.9 4.2  CL 98* 104 102 105 105  CO2 30 25 25 27 24   GLUCOSE 120* 113* 103* 107* 86  BUN 18 13 8 9 9   CREATININE 1.28* 1.13* 1.02* 1.33* 1.23*  CALCIUM 9.3 8.1* 8.1* 8.5* 8.6*  AST 27 270* 115* 95* 58*  ALT 22 211* 152* 134* 99*  ALKPHOS 87 132* 137* 132* 136*  BILITOT 0.6 1.2 1.7* 1.9* 1.2   ------------------------------------------------------------------------------------------------------------------ estimated creatinine clearance is 99 mL/min (by C-G formula based on Cr of 1.23). ------------------------------------------------------------------------------------------------------------------ No results for input(s): HGBA1C in the last 72 hours. ------------------------------------------------------------------------------------------------------------------ No results for input(s): CHOL, HDL, LDLCALC, TRIG, CHOLHDL, LDLDIRECT in the last 72 hours. ------------------------------------------------------------------------------------------------------------------ No results for input(s): TSH, T4TOTAL, T3FREE, THYROIDAB in the last 72 hours.  Invalid input(s): FREET3 ------------------------------------------------------------------------------------------------------------------ No results for input(s): VITAMINB12, FOLATE, FERRITIN, TIBC, IRON, RETICCTPCT in the last 72 hours.  Coagulation profile  Recent Labs Lab 04/16/15 0520 04/17/15 1012 04/17/15 1421 04/18/15 0540  INR 1.91* 2.10* 1.79* 1.41    No results for input(s): DDIMER in the last 72 hours.  Cardiac Enzymes No results for input(s): CKMB, TROPONINI,  MYOGLOBIN in the last 168 hours.  Invalid input(s): CK ------------------------------------------------------------------------------------------------------------------ Invalid input(s): POCBNP   Time Spent in minutes 35   Ryosuke Ericksen K M.D on 04/20/2015 at 10:55 AM  Between 7am to 7pm - Pager - [phone]  After 7pm go to [email or web address] - password [facility]  [facility] -  Office  [phone]

## 2015-04-20 NOTE — Clinical Documentation Improvement (Signed)
Internal Medicine  Abnormal Lab/Test Results:  Serum Creatinine's ranging from 1.28, 1.13, 1.02, 1.33 and 1.23     GFR's ranging from 58, 60, 60, 56 and 60  Possible Clinical Conditions associated with below indicators  ARF  CKD with Stage please  Other Condition  Cannot Clinically Determine  Supporting Information:  Biliary Colic  NPO prior to Lap Chole  Treatment Provided:  Had Lactated Ringers and 0.9 NaCl infusions prior to OR  Please exercise your independent, professional judgment when responding. A specific answer is not anticipated or expected.  Thank You,  Zoila Shutter RN, BSN, [place] [phone]: Cell: [phone]

## 2015-04-20 NOTE — Care Management Important Message (Signed)
Important Message  Patient Details  Name: Alice [name] MRN: [id number] Date of Birth: 1970/08/08   Medicare Important Message Given:  Yes    [name] [name] 04/20/2015, 12:07 PM

## 2015-04-20 NOTE — Progress Notes (Signed)
Patient ID: Alice [name], female   DOB: 1971/04/05, 44 y.o.   MRN: [id number]     [facility]      [address] [place], [place] [address]    Phone: [phone] FAX: [phone]     Subjective: Nauseated, no flatus.  Walked once just in the back hall.  No vomiting.    Objective:  Vital signs:  Filed Vitals:   04/19/15 0602 04/19/15 1342 04/19/15 2112 04/20/15 0506  BP: 138/84 124/72 129/76 117/62  Pulse: 121 102 102 90  Temp: 99.4 F (37.4 C) 98.9 F (37.2 C) 97.6 F (36.4 C) 98.3 F (36.8 C)  TempSrc: Oral Oral Oral Oral  Resp: _0 Height:      Weight: 168.8 kg (372 lb 2.2 oz)   169.9 kg (374 lb 9 oz)  SpO2: 96% 97% 95% 94%    Last BM Date: 04/16/15  Intake/Output   Yesterday:  12/04 0701 - 12/05 0700 In: 2298.8 [P.O.:720; I.V.:1578.8] Out: 875 [Urine:875] This shift:    I/O last 3 completed shifts: In: 3457.1 [P.O.:1160; I.V.:2297.1] Out: 1425 [Urine:1425]    Physical Exam: General: Pt awake/alert/oriented x4 in no acute distress Abdomen: Soft.  Nondistended.   Mildly tender at incisions only. Incisions are c/d/i.  No evidence of peritonitis.  No incarcerated hernias.    Problem List:   Principal Problem:   Acute bilateral upper abdominal pain Active Problems:   OBESITY, MORBID   HTN (hypertension)   History of pulmonary embolism   History of DVT (deep vein thrombosis)   GASTROESOPHAGEAL REFLUX DISEASE   OSA (obstructive sleep apnea)   CKD (chronic kidney disease), stage III   Anemia   Symptomatic cholelithiasis    Results:   Labs: Results for orders placed or performed during the hospital encounter of 04/16/15 (from the past 48 hour(s))  Heparin level (unfractionated)     Status: Abnormal   Collection Time: 04/19/15  5:12 AM  Result Value Ref Range   Heparin Unfractionated 0.19 (L) 0.30 - 0.70 IU/mL    Comment:        IF HEPARIN RESULTS ARE BELOW EXPECTED VALUES, AND  PATIENT DOSAGE HAS BEEN CONFIRMED, SUGGEST FOLLOW UP TESTING OF ANTITHROMBIN III LEVELS.   CBC     Status: Abnormal   Collection Time: 04/19/15  5:12 AM  Result Value Ref Range   WBC 16.3 (H) 4.0 - 10.5 K/uL   RBC 4.35 3.87 - 5.11 MIL/uL   Hemoglobin 11.7 (L) 12.0 - 15.0 g/dL   HCT 35.5 (L) 36.0 - 46.0 %   MCV 81.6 78.0 - 100.0 fL   MCH 26.9 26.0 - 34.0 pg   MCHC 33.0 30.0 - 36.0 g/dL   RDW 15.8 (H) 11.5 - 15.5 %   Platelets 221 150 - 400 K/uL  Comprehensive metabolic panel     Status: Abnormal   Collection Time: 04/19/15  5:12 AM  Result Value Ref Range   Sodium 138 135 - 145 mmol/L   Potassium 3.9 3.5 - 5.1 mmol/L   Chloride 105 101 - 111 mmol/L   CO2 27 22 - 32 mmol/L   Glucose, Bld 107 (H) 65 - 99 mg/dL   BUN 9 6 - 20 mg/dL   Creatinine, Ser 1.33 (H) 0.44 - 1.00 mg/dL   Calcium 8.5 (L) 8.9 - 10.3 mg/dL   Total Protein 6.9 6.5 - 8.1 g/dL   Albumin 2.3 (L) 3.5 - 5.0 g/dL  AST 95 (H) 15 - 41 U/L   ALT 134 (H) 14 - 54 U/L   Alkaline Phosphatase 132 (H) 38 - 126 U/L   Total Bilirubin 1.9 (H) 0.3 - 1.2 mg/dL   GFR calc non Af Amer 48 (L) >60 mL/min   GFR calc Af Amer 56 (L) >60 mL/min    Comment: (NOTE) The eGFR has been calculated using the CKD EPI equation. This calculation has not been validated in all clinical situations. eGFR's persistently <60 mL/min signify possible Chronic Kidney Disease.    Anion gap 6 5 - 15  Osmolality     Status: None   Collection Time: 04/19/15  8:17 AM  Result Value Ref Range   Osmolality 288 275 - 295 mOsm/kg    Comment: Please note change in reference range.  Creatinine, urine, random     Status: None   Collection Time: 04/19/15 11:12 AM  Result Value Ref Range   Creatinine, Urine 286.51 mg/dL  Osmolality, urine     Status: None   Collection Time: 04/19/15 11:12 AM  Result Value Ref Range   Osmolality, Ur 435 300 - 900 mOsm/kg    Comment: Please note change in reference range.  Sodium, urine, random     Status: None    Collection Time: 04/19/15 11:12 AM  Result Value Ref Range   Sodium, Ur 12 mmol/L  CBC     Status: Abnormal   Collection Time: 04/20/15  4:16 AM  Result Value Ref Range   WBC 15.6 (H) 4.0 - 10.5 K/uL   RBC 4.41 3.87 - 5.11 MIL/uL   Hemoglobin 12.1 12.0 - 15.0 g/dL   HCT 35.7 (L) 36.0 - 46.0 %   MCV 81.0 78.0 - 100.0 fL   MCH 27.4 26.0 - 34.0 pg   MCHC 33.9 30.0 - 36.0 g/dL   RDW 16.0 (H) 11.5 - 15.5 %   Platelets 280 150 - 400 K/uL  Comprehensive metabolic panel     Status: Abnormal   Collection Time: 04/20/15  4:16 AM  Result Value Ref Range   Sodium 137 135 - 145 mmol/L   Potassium 4.2 3.5 - 5.1 mmol/L   Chloride 105 101 - 111 mmol/L   CO2 24 22 - 32 mmol/L   Glucose, Bld 86 65 - 99 mg/dL   BUN 9 6 - 20 mg/dL   Creatinine, Ser 1.23 (H) 0.44 - 1.00 mg/dL   Calcium 8.6 (L) 8.9 - 10.3 mg/dL   Total Protein 7.3 6.5 - 8.1 g/dL   Albumin 2.3 (L) 3.5 - 5.0 g/dL   AST 58 (H) 15 - 41 U/L   ALT 99 (H) 14 - 54 U/L   Alkaline Phosphatase 136 (H) 38 - 126 U/L   Total Bilirubin 1.2 0.3 - 1.2 mg/dL   GFR calc non Af Amer 53 (L) >60 mL/min   GFR calc Af Amer >60 >60 mL/min    Comment: (NOTE) The eGFR has been calculated using the CKD EPI equation. This calculation has not been validated in all clinical situations. eGFR's persistently <60 mL/min signify possible Chronic Kidney Disease.    Anion gap 8 5 - 15    Imaging / Studies: Dg Chest Port 1 View  04/19/2015  CLINICAL DATA:  Shortness of breath EXAM: PORTABLE CHEST 1 VIEW COMPARISON:  04/16/2015 FINDINGS: Low lung volumes. No focal consolidation. No pleural effusion or pneumothorax. Cardiomegaly. IMPRESSION: No evidence of acute cardiopulmonary disease. Electronically Signed   By: Henderson Newcomer.D.  On: 04/19/2015 16:31    Medications / Allergies:  Scheduled Meds: . cefTRIAXone (ROCEPHIN)  IV  2 g Intravenous Q24H  . enoxaparin (LOVENOX) injection  1 mg/kg Subcutaneous Q12H  . metoprolol tartrate  50 mg Oral BID  .  pantoprazole (PROTONIX) IV  40 mg Intravenous Q12H  . warfarin  10 mg Oral ONCE-1800  . Warfarin - Pharmacist Dosing Inpatient   Does not apply q1800   Continuous Infusions:  PRN Meds:.acetaminophen **OR** [DISCONTINUED] acetaminophen, hydrALAZINE, HYDROmorphone (DILAUDID) injection, metoprolol, ondansetron (ZOFRAN) IV, oxyCODONE-acetaminophen  Antibiotics: Anti-infectives    Start     Dose/Rate Route Frequency Ordered Stop   04/18/15 0800  cefTRIAXone (ROCEPHIN) 2 g in dextrose 5 % 50 mL IVPB    Comments:  Pharmacy may adjust dosing strength / duration / interval for maximal efficacy   2 g 100 mL/hr over 30 Minutes Intravenous Every 24 hours 04/17/15 1248     04/17/15 1230  Ampicillin-Sulbactam (UNASYN) 3 g in sodium chloride 0.9 % 100 mL IVPB  Status:  Discontinued     3 g 100 mL/hr over 60 Minutes Intravenous Every 6 hours 04/17/15 1215 04/17/15 1248   04/17/15 1000  cefTRIAXone (ROCEPHIN) 1 g in dextrose 5 % 50 mL IVPB  Status:  Discontinued     1 g 100 mL/hr over 30 Minutes Intravenous Every 24 hours 04/16/15 1439 04/17/15 1144   04/16/15 1115  cefTRIAXone (ROCEPHIN) 1 g in dextrose 5 % 50 mL IVPB  Status:  Discontinued     1 g 100 mL/hr over 30 Minutes Intravenous Every 24 hours 04/16/15 1110 04/16/15 1438        Assessment/Plan POD#2 laparoscopic cholecystectomy---Dr. Rosendo Gros -needs to mobilize  -advance diet as tolerated -add colace/miralax  -probably need another day d/t nausea DVT-? Is the patient back on coumadin, may resume if not. Dispo-follow up arranged.    Erby Pian, [facility] Pager [phone]) For consults and floor pages call [phone](7A-4:30P)  04/20/2015 8:56 AM

## 2015-04-21 LAB — CBC
HCT: 32.1 % — ABNORMAL LOW (ref 36.0–46.0)
Hemoglobin: 11.1 g/dL — ABNORMAL LOW (ref 12.0–15.0)
MCH: 27.7 pg (ref 26.0–34.0)
MCHC: 34.6 g/dL (ref 30.0–36.0)
MCV: 80 fL (ref 78.0–100.0)
PLATELETS: 287 10*3/uL (ref 150–400)
RBC: 4.01 MIL/uL (ref 3.87–5.11)
RDW: 15.8 % — ABNORMAL HIGH (ref 11.5–15.5)
WBC: 11.7 10*3/uL — ABNORMAL HIGH (ref 4.0–10.5)

## 2015-04-21 MED ORDER — HYDROMORPHONE HCL 1 MG/ML IJ SOLN
0.5000 mg | INTRAMUSCULAR | Status: DC | PRN
  Administered 2015-04-21: 0.5 mg via INTRAVENOUS
  Filled 2015-04-21: qty 1

## 2015-04-21 MED ORDER — WARFARIN SODIUM 5 MG PO TABS: 10.0000 mg | ORAL_TABLET | Freq: Once | ORAL | Status: DC

## 2015-04-21 MED ORDER — ENOXAPARIN SODIUM 100 MG/ML ~~LOC~~ SOLN: 150.0000 mg | Freq: Two times a day (BID) | SUBCUTANEOUS | Status: DC

## 2015-04-21 MED ORDER — OXYCODONE-ACETAMINOPHEN 5-325 MG PO TABS: 1.0000 | ORAL_TABLET | ORAL | Status: DC | PRN

## 2015-04-21 NOTE — Care Management Note (Addendum)
Case Management Note  Patient Details  Name: BLAZE DOMITROVICH MRN: [id number] Date of Birth: Feb 09, 1971  Subjective/Objective:                    Action/Plan:  Ordered rolling walker through [place] for home health needs . Asked bedside nurse Baxter Flattery to do Lovenox teaching , awaiting PT eval and recommendations.   Patient has Medicaid Lovenox co pay $3 . Called [facility] to confirm . Expected Discharge Date:                  Expected Discharge Plan:  [place]  In-House Referral:     Discharge planning Services  CM Consult  Post Acute Care Choice:  Durable Medical Equipment, Home Health Choice offered to:  Patient  DME Arranged:    DME Agency:     HH Arranged:    [place] Agency:     Status of Service:  In process, will continue to follow  Medicare Important Message Given:  Yes Date Medicare IM Given:    Medicare IM give by:    Date Additional Medicare IM Given:    Additional Medicare Important Message give by:     If discussed at [place] of Stay Meetings, dates discussed:    Additional Comments:  Marilu Favre, RN 04/21/2015, 10:10 AM

## 2015-04-21 NOTE — Discharge Summary (Signed)
Alice [name], is a 44 y.o. female  DOB Feb 22, 1971  MRN [id number].  Admission date:  04/16/2015  Admitting Physician  Mechele Claude, DO  Discharge Date:  04/21/2015   Primary MD  Benito Mccreedy, MD  Recommendations for primary care physician for things to follow:   Check CBC, CMP next visit. Monitor INR in 2 days. Adjust Coumadin and Lovenox dose.   Admission Diagnosis  Epigastric pain [R10.13] Acute epigastric pain [R10.13] Symptomatic cholelithiasis [K80.20] Cholelithiasis [K80.20] Abdominal pain [R10.9]   Discharge Diagnosis  Epigastric pain [R10.13] Acute epigastric pain [R10.13] Symptomatic cholelithiasis [K80.20] Cholelithiasis [K80.20] Abdominal pain [R10.9]     Principal Problem:   Acute bilateral upper abdominal pain Active Problems:   OBESITY, MORBID   HTN (hypertension)   History of pulmonary embolism   History of DVT (deep vein thrombosis)   GASTROESOPHAGEAL REFLUX DISEASE   OSA (obstructive sleep apnea)   CKD (chronic kidney disease), stage III   Anemia   Symptomatic cholelithiasis      Past Medical History  Diagnosis Date  . Hypertension   . Varicose veins of right lower extremity   . Anemia   . GERD (gastroesophageal reflux disease)   . DVT (deep venous thrombosis) (HCC)     BLE  . Pulmonary embolism ([place])   . Obesity   . Sinusitis nasal   . Pneumonia 2014  . Chronic bronchitis ([place])   . Sleep apnea     "I'm suppose to wear a mask but I don't" (04/16/2015)  . Headache     "weekly" (04/16/2015)  . Migraine     "monthly" (04/16/2015)  . Chronic upper back pain   . Anxiety   . Depression     Past Surgical History  Procedure Laterality Date  . Incision and drainage Right 09/10/2008    leg:  skin and soft tissue and muscle/notes 09/15/2010  . Laparoscopic gastric  bypass  ~ 2007  . Incision and drainage Right 01/01/2008    Chronic venous stasis insufficiency ulcer,/notes 09/14/2010/  . Incision and drainage Right [id number]    calf w/application wound vac/notes 07/20/2010  . Hysteroscopy w/d&c  12/24/2001    Archie Endo 09/28/2010  . Tonsillectomy    . Cholecystectomy N/A 04/18/2015    Procedure: LAPAROSCOPIC CHOLECYSTECTOMY;  Surgeon: Ralene Ok, MD;  Location: [place];  Service: General;  Laterality: N/A;       HPI  from the history and physical done on the day of admission:    This is a 44 year old morbidly obese African-American female with history of DVT in the past, essential hypertension was admitted to the hospital with acute cholecystitis, she was seen by general surgery underwent laparoscopic cholecystectomy on 04/18/2015. She has tolerated the procedure well. Now she is active, ambulated yesterday, cleared by general surgery for discharge.     Hospital Course:     1. Acute cholecystitis could be secondary to gallstones. General surgery on board, underwent laparoscopic cholecystectomy on 04/18/2015, per general surgery continue IV ABX , continue pain control and supportive  care, advance diet and activity. His chest with general surgery stable for discharge.   2. Mildly elevated LFTs. Could be due to #1 above, trend stable stop checking. Repeat CMP in a week outpatient.   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 Due to surgery INR had to be reversed, currently on Lovenox bridge with Coumadin, was PCP to check INR in 2 days and adjust Coumadin and Lovenox dose as needed.   4. Morbid obesity. Outpatient follow-up with PCP post discharge.   5. Essential hypertension. Resume home regimen.   6. Mild ARF upon admission due to dehydration and cholecystitis. It resolved after hydration. Baseline creatinine is close to 1.2.    Discharge Condition: Stable  Follow UP  Follow-up Information    Follow up with [facility] On 05/06/2015.   Specialty:  General Surgery   Why:  arrive by 10AM for a 10:30AM post operative check up with physician assistant.   Contact information:   [address] [place] [address] [phone]       Follow up with OSEI-BONSU,GEORGE, MD. Schedule an appointment as soon as possible for a visit in 2 days.   Specialty:  Internal Medicine   Why:  Coumadin and Lovenox dose adjustment   Contact information:   [id number] [place] [address] [phone]        Consults obtained - General surgery.  Diet and Activity recommendation: See Discharge Instructions below  Discharge Instructions       Discharge Instructions    Diet - low sodium heart healthy    Complete by:  As directed      Discharge instructions    Complete by:  As directed   Follow with Primary MD OSEI-BONSU,GEORGE, MD in 2 days   Get CBC, INR, CMP, 2 view Chest X ray checked  by Primary MD next visit.   Follow with your primary care physician within 2 days for Coumadin dose adjustment and Lovenox dose adjustment.   Activity: As tolerated with Full fall precautions use walker/cane & assistance as needed following restrictions given by general surgery in writing.   Disposition Home    Diet: Heart Healthy   For Heart failure patients - Check your Weight same time everyday, if you gain over 2 pounds, or you develop in leg swelling, experience more shortness of breath or chest pain, call your Primary MD immediately. Follow Cardiac Low Salt Diet and 1.5 lit/day fluid restriction.   On your next visit with your primary care physician please Get Medicines reviewed and adjusted.   Please request your Prim.MD to go over all Hospital Tests and Procedure/Radiological results at the follow up, please get all Hospital records sent to your Prim MD by signing hospital release before you go home.   If you experience worsening of your admission symptoms, develop  shortness of breath, life threatening emergency, suicidal or homicidal thoughts you must seek medical attention immediately by calling 911 or calling your MD immediately  if symptoms less severe.  You Must read complete instructions/literature along with all the possible adverse reactions/side effects for all the Medicines you take and that have been prescribed to you. Take any new Medicines after you have completely understood and accpet all the possible adverse reactions/side effects.   Do not drive, operating heavy machinery, perform activities at heights, swimming or participation in water activities or provide baby sitting services if your were admitted for syncope  or siezures until you have seen by Primary MD or a Neurologist and advised to do so again.  Do not drive when taking Pain medications.    Do not take more than prescribed Pain, Sleep and Anxiety Medications  Special Instructions: If you have smoked or chewed Tobacco  in the last 2 yrs please stop smoking, stop any regular Alcohol  and or any Recreational drug use.  Wear Seat belts while driving.   Please note  You were cared for by a hospitalist during your hospital stay. If you have any questions about your discharge medications or the care you received while you were in the hospital after you are discharged, you can call the unit and asked to speak with the hospitalist on call if the hospitalist that took care of you is not available. Once you are discharged, your primary care physician will handle any further medical issues. Please note that NO REFILLS for any discharge medications will be authorized once you are discharged, as it is imperative that you return to your primary care physician (or establish a relationship with a primary care physician if you do not have one) for your aftercare needs so that they can reassess your need for medications and monitor your lab values.     Increase activity slowly    Complete by:  As  directed              Discharge Medications       Medication List    STOP taking these medications        HYDROcodone-acetaminophen 5-325 MG tablet  Commonly known as:  NORCO/VICODIN      TAKE these medications        amLODipine 10 MG tablet  Commonly known as:  NORVASC  Take 1 tablet (10 mg total) by mouth daily.     docusate sodium 100 MG capsule  Commonly known as:  COLACE  Take 1 capsule (100 mg total) by mouth 2 (two) times daily.     enoxaparin 100 MG/ML injection  Commonly known as:  LOVENOX  Inject 1.5 mLs (150 mg total) into the skin every 12 (twelve) hours. Get INR checked by her primary care physician in 2 days and get Coumadin and Lovenox dose adjusted.     hydrochlorothiazide 25 MG tablet  Commonly known as:  HYDRODIURIL  Take 25 mg by mouth daily.     metoCLOPramide 10 MG tablet  Commonly known as:  REGLAN  Take 1 tablet (10 mg total) by mouth every 6 (six) hours as needed for nausea or vomiting.     metroNIDAZOLE 500 MG tablet  Commonly known as:  FLAGYL  Take 4 tablets (2,000 mg total) by mouth once.     oxyCODONE-acetaminophen 5-325 MG tablet  Commonly known as:  PERCOCET/ROXICET  Take 1-2 tablets by mouth every 4 (four) hours as needed for severe pain.     pantoprazole 20 MG tablet  Commonly known as:  PROTONIX  Take 1 tablet (20 mg total) by mouth daily.     warfarin 2.5 MG tablet  Commonly known as:  COUMADIN  Take 5 tablets (12.5 mg total) by mouth one time only at 6 PM. Take daily until INR checked and then per PCP.        Major procedures and Radiology Reports - PLEASE review detailed and final reports for all details, in brief -   Laparoscopic cholecystectomy by general surgery on 04/18/2015   Dg Chest 2 View  04/16/2015  CLINICAL DATA:  Chest pain and nausea, onset tonight. EXAM: CHEST  2 VIEW COMPARISON:  03/01/2015 FINDINGS: The heart size and mediastinal contours are within normal limits. Both lungs are clear. The  visualized skeletal structures are unremarkable. IMPRESSION: No active cardiopulmonary disease. Electronically Signed   By: Andreas Newport M.D.   On: 04/16/2015 06:22   Nm Hepato W/eject Fract  04/16/2015  CLINICAL DATA:  Abdominal pain.  Chest pain. EXAM: NUCLEAR MEDICINE HEPATOBILIARY IMAGING TECHNIQUE: Sequential images of the abdomen were obtained out to 60 minutes following intravenous administration of radiopharmaceutical. RADIOPHARMACEUTICALS:  5.0 mCi Tc-13m  Choletec IV COMPARISON:  04/16/2015 FINDINGS: Flow study shows uniform distribution of radioactivity in the liver. Sequential imaging of the liver and abdomen over a period of 60 minutes shows uniform uptake of the tracer in the liver. There is no filling of the gallbladder at the end of 60 minutes. 6.3 mg of morphine was administered and imaging was performed for an additional 30 minutes demonstrating no filling of the gallbladder. Radiotracer uptake is present in the small bowel at minutes. At the end of 1-hour, there is relatively good clearance of activity from the liver. IMPRESSION: 1. Findings most consistent with cystic duct obstruction and cholecystitis. Electronically Signed   By: Kathreen Devoid   On: 04/16/2015 19:17   Dg Chest Port 1 View  04/19/2015  CLINICAL DATA:  Shortness of breath EXAM: PORTABLE CHEST 1 VIEW COMPARISON:  04/16/2015 FINDINGS: Low lung volumes. No focal consolidation. No pleural effusion or pneumothorax. Cardiomegaly. IMPRESSION: No evidence of acute cardiopulmonary disease. Electronically Signed   By: Julian Hy M.D.   On: 04/19/2015 16:31   Ct Angio Abd/pel W/ And/or W/o  04/16/2015  CLINICAL DATA:  Epigastric pain, left upper quadrant abdominal pain and lower left chest pain radiating into back. Evidence of gallbladder distention and cholelithiasis by ultrasound. EXAM: CTA ABDOMEN AND PELVIS WITHOUT CONTRAST TECHNIQUE: Multidetector CT imaging of the abdomen and pelvis was performed using the standard  protocol during bolus administration of intravenous contrast. Multiplanar reconstructed images and MIPs were obtained and reviewed to evaluate the vascular anatomy. CONTRAST:  168mL OMNIPAQUE IOHEXOL 350 MG/ML SOLN COMPARISON:  Right upper quadrant abdominal ultrasound on 04/16/2015 as well as prior CT of the abdomen and pelvis on 03/01/2015. FINDINGS: The abdominal aorta is of normal caliber and shows no evidence of aneurysmal disease or atherosclerosis. Visceral branch vessels are well demonstrated with normal patency of the celiac axis, superior mesenteric artery, bilateral single renal arteries and the inferior mesenteric artery. Bilateral iliac and common femoral arteries are normally patent. Stable gallbladder distension without evidence of biliary ductal dilatation. There is a small amount of fluid in between the right kidney and the medial margin of the inferior right lobe of the liver which was not present on the prior CT. This is close to the posterior margin of the gallbladder. This is nonspecific but may implicate some degree of cholecystitis. No other significant inflammatory changes are seen surrounding other margins of the gallbladder. 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 No evidence of bowel obstruction or perforation. No focal abscess is identified. The bladder is unremarkable. IUD again visualized in the endometrial cavity of the uterus. No hernias, focal masses or enlarged lymph nodes are seen. Bony structures show degenerative disc disease at L4-5 and L5-S1. Review of the MIP images confirms the above findings. IMPRESSION: 1. Normal abdominal aorta and visceral arteries with no evidence by CTA of mesenteric arterial occlusive disease. 2. Similar gallbladder  distension to prior CT and ultrasound exams. There is a small amount of fluid posterior to the gallbladder and in between the right kidney and right lobe of the liver. This was not present on the prior CT and may implicate some  regional inflammation, potentially from the gallbladder. Nuclear medicine hepatobiliary scintigraphy may be helpful in determining whether there is evidence of cholecystitis. 3. Prior gastric bypass surgery without evidence of complication or acute process involving the bowel. Electronically Signed   By: Aletta Edouard M.D.   On: 04/16/2015 15:00   US Abdomen Limited Ruq  04/16/2015  CLINICAL DATA:  Epigastric pain, history of gastric bypass, hypertension, obesity EXAM: US ABDOMEN LIMITED - RIGHT UPPER QUADRANT COMPARISON:  CT abdomen pelvis 03/01/2015 FINDINGS: Gallbladder: Large shadowing echogenic focus within gallbladder 11.2 cm length most consistent with cholelithiasis. The presence of shadowing makes this less likely to represent a large sludge ball. Gallbladder is distended, 12.9 cm length. No gallbladder wall thickening or pericholecystic fluid. Equivocal sonographic Murphy sign. Common bile duct: Diameter: 8 mm mildly dilated for age Liver: Normal appearance Image quality degraded secondary to body habitus. No RIGHT upper quadrant free fluid. IMPRESSION: Distended gallbladder containing an 11.2 mm diameter shadowing focus most likely representing cholelithiasis. Equivocal sonographic Percell Miller sign without evidence of gallbladder wall thickening. Mild biliary dilatation for age; recommend correlation with LFTs. Electronically Signed   By: Lavonia Dana M.D.   On: 04/16/2015 08:24    Micro Results      Recent Results (from the past 240 hour(s))  Surgical pcr screen     Status: None   Collection Time: 04/17/15  9:40 AM  Result Value Ref Range Status   MRSA, PCR NEGATIVE NEGATIVE Final   Staphylococcus aureus NEGATIVE NEGATIVE Final    Comment:        The Xpert SA Assay (FDA approved for NASAL specimens in patients over 43 years of age), is one component of a comprehensive surveillance program.  Test performance has been validated by [facility] for patients greater than or equal to 34  year old. It is not intended to diagnose infection nor to guide or monitor treatment.     Today   Subjective    Alice [name] today has no headache,no chest abdominal pain,no new weakness tingling or numbness, feels much better wants to go home today.     Objective   Blood pressure 129/49, pulse 96, temperature 99.1 F (37.3 C), temperature source Oral, resp. rate 18, height 5\' 8"  (1.727 m), weight 169.9 kg (374 lb 9 oz), SpO2 96 %.   Intake/Output Summary (Last 24 hours) at 04/21/15 0957 Last data filed at 04/20/15 2245  Gross per 24 hour  Intake    720 ml  Output   1000 ml  Net   -280 ml    Exam Awake Alert, Oriented x 3, No new F.N deficits, Normal affect [place].AT,PERRAL Supple Neck,No JVD, No cervical lymphadenopathy appriciated.  Symmetrical Chest wall movement, Good air movement bilaterally, CTAB RRR,No Gallops,Rubs or new Murmurs, No Parasternal Heave +ve B.Sounds, Abd Soft, Non tender, No organomegaly appriciated, No rebound -guarding or rigidity. Post laparoscopic cholecystectomy scars appear stable  No Cyanosis, Clubbing or edema, No new Rash or bruise   Data Review   CBC w Diff: Lab Results  Component Value Date   WBC 11.7* 04/21/2015   HGB 11.1* 04/21/2015   HCT 32.1* 04/21/2015   PLT 287 04/21/2015   LYMPHOPCT 24 04/16/2015   MONOPCT 5 04/16/2015   EOSPCT  3 04/16/2015   BASOPCT 0 04/16/2015    CMP: Lab Results  Component Value Date   NA 137 04/20/2015   K 4.2 04/20/2015   CL 105 04/20/2015   CO2 24 04/20/2015   BUN 9 04/20/2015   CREATININE 1.23* 04/20/2015   PROT 7.3 04/20/2015   ALBUMIN 2.3* 04/20/2015   BILITOT 1.2 04/20/2015   ALKPHOS 136* 04/20/2015   AST 58* 04/20/2015   ALT 99* 04/20/2015  .   Total Time in preparing paper work, data evaluation and todays exam - 35 minutes  Thurnell Lose M.D on 04/21/2015 at 9:57 AM  [facility]   Office  [phone]

## 2015-04-21 NOTE — Discharge Instructions (Signed)
Cholelithiasis Cholelithiasis (also called gallstones) is a form of gallbladder disease in which gallstones form in your gallbladder. The gallbladder is an organ that stores bile made in the liver, which helps digest fats. Gallstones begin as small crystals and slowly grow into stones. Gallstone pain occurs when the gallbladder spasms and a gallstone is blocking the duct. Pain can also occur when a stone passes out of the duct.  RISK FACTORS  Being female.   Having multiple pregnancies. Health care providers sometimes advise removing diseased gallbladders before future pregnancies.   Being obese.  Eating a diet heavy in fried foods and fat.   Being older than 24 years and increasing age.   Prolonged use of medicines containing female hormones.   Having diabetes mellitus.   Rapidly losing weight.   Having a family history of gallstones (heredity).  SYMPTOMS  Nausea.   Vomiting.  Abdominal pain.   Yellowing of the skin (jaundice).   Sudden pain. It may persist from several minutes to several hours.  Fever.   Tenderness to the touch. In some cases, when gallstones do not move into the bile duct, people have no pain or symptoms. These are called "silent" gallstones.  TREATMENT Silent gallstones do not need treatment. In severe cases, emergency surgery may be required. Options for treatment include:  Surgery to remove the gallbladder. This is the most common treatment.  Medicines. These do not always work and may take 6-12 months or more to work.  Shock wave treatment (extracorporeal biliary lithotripsy). In this treatment an ultrasound machine sends shock waves to the gallbladder to break gallstones into smaller pieces that can pass into the intestines or be dissolved by medicine. HOME CARE INSTRUCTIONS   Only take over-the-counter or prescription medicines for pain, discomfort, or fever as directed by your health care provider.   Follow a low-fat diet until  seen again by your health care provider. Fat causes the gallbladder to contract, which can result in pain.   Follow up with your health care provider as directed. Attacks are almost always recurrent and surgery is usually required for permanent treatment.  SEEK IMMEDIATE MEDICAL CARE IF:   Your pain increases and is not controlled by medicines.   You have a fever or persistent symptoms for more than 2-3 days.   You have a fever and your symptoms suddenly get worse.   You have persistent nausea and vomiting.  MAKE SURE YOU:   Understand these instructions.  Will watch your condition.  Will get help right away if you are not doing well or get worse.   This information is not intended to replace advice given to you by your health care provider. Make sure you discuss any questions you have with your health care provider.   Document Released: 04/28/2005 Document Revised: 01/02/2013 Document Reviewed: 10/24/2012 Elsevier Interactive Patient Education 2016 [place]: POST OP INSTRUCTIONS  1. DIET: Follow a light bland diet the first 24 hours after arrival home, such as soup, liquids, crackers, etc.  Be sure to include lots of fluids daily.  Avoid fast food or heavy meals as your are more likely to get nauseated.  Eat a low fat the next few days after surgery.   2. Take your usually prescribed home medications unless otherwise directed. 3. PAIN CONTROL: a. Pain is best controlled by a usual combination of three different methods TOGETHER: i. Ice/Heat ii. Over the counter pain medication iii. Prescription pain medication b. Most patients will experience some  swelling and bruising around the incisions.  Ice packs or heating pads (30-60 minutes up to 6 times a day) will help. Use ice for the first few days to help decrease swelling and bruising, then switch to heat to help relax tight/sore spots and speed recovery.  Some people prefer to use ice alone, heat  alone, alternating between ice & heat.  Experiment to what works for you.  Swelling and bruising can take several weeks to resolve.   c. It is helpful to take an over-the-counter pain medication regularly for the first few weeks.  Choose one of the following that works best for you: i. Naproxen (Aleve, etc)  Two 220mg  tabs twice a day ii. Ibuprofen (Advil, etc) Three 200mg  tabs four times a day (every meal & bedtime) iii. Acetaminophen (Tylenol, etc) 500-650mg  four times a day (every meal & bedtime) d. A  prescription for pain medication (such as oxycodone, hydrocodone, etc) should be given to you upon discharge.  Take your pain medication as prescribed.  i. If you are having problems/concerns with the prescription medicine (does not control pain, nausea, vomiting, rash, itching, etc), please call us [phone] to see if we need to switch you to a different pain medicine that will work better for you and/or control your side effect better. ii. If you need a refill on your pain medication, please contact your pharmacy.  They will contact our office to request authorization. Prescriptions will not be filled after 5 pm or on week-ends. 4. Avoid getting constipated.  Between the surgery and the pain medications, it is common to experience some constipation.  Increasing fluid intake and taking a fiber supplement (such as Metamucil, Citrucel, FiberCon, MiraLax, etc) 1-2 times a day regularly will usually help prevent this problem from occurring.  A mild laxative (prune juice, Milk of Magnesia, MiraLax, etc) should be taken according to package directions if there are no bowel movements after 48 hours.   5. Watch out for diarrhea.  If you have many loose bowel movements, simplify your diet to bland foods & liquids for a few days.  Stop any stool softeners and decrease your fiber supplement.  Switching to mild anti-diarrheal medications (Kayopectate, Pepto Bismol) can help.  If this worsens or does not improve,  please call us. 6. Wash / shower every day.  You may shower over the dressings as they are waterproof.  Continue to shower over incision(s) after the dressing is off. 7. Remove your waterproof bandages 5 days after surgery.  You may leave the incision open to air.  You may replace a dressing/Band-Aid to cover the incision for comfort if you wish.  8. ACTIVITIES as tolerated:   a. You may resume regular (light) daily activities beginning the next day--such as daily self-care, walking, climbing stairs--gradually increasing activities as tolerated.  If you can walk 30 minutes without difficulty, it is safe to try more intense activity such as jogging, treadmill, bicycling, low-impact aerobics, swimming, etc. b. Save the most intensive and strenuous activity for last such as sit-ups, heavy lifting, contact sports, etc  Refrain from any heavy lifting or straining until you are off narcotics for pain control.   c. DO NOT PUSH THROUGH PAIN.  Let pain be your guide: If it hurts to do something, don't do it.  Pain is your body warning you to avoid that activity for another week until the pain goes down. d. You may drive when you are no longer taking prescription pain medication, you can  comfortably wear a seatbelt, and you can safely maneuver your car and apply brakes. e. Dennis Bast may have sexual intercourse when it is comfortable.  9. FOLLOW UP in our office a. Please call [facility] at [phone] to set up an appointment to see your surgeon in the office for a follow-up appointment approximately 2-3 weeks after your surgery. b. Make sure that you call for this appointment the day you arrive home to insure a convenient appointment time. 10. IF YOU HAVE DISABILITY OR FAMILY LEAVE FORMS, BRING THEM TO THE OFFICE FOR PROCESSING.  DO NOT GIVE THEM TO YOUR DOCTOR.   WHEN TO CALL us [phone]: 1. Poor pain control 2. Reactions / problems with new medications (rash/itching, nausea, etc)  3. Fever over 101.5 F (38.5  C) 4. Inability to urinate 5. Nausea and/or vomiting 6. Worsening swelling or bruising 7. Continued bleeding from incision. 8. Increased pain, redness, or drainage from the incision   The clinic staff is available to answer your questions during regular business hours (8:30am-5pm).  Please dont hesitate to call and ask to speak to one of our nurses for clinical concerns.   If you have a medical emergency, go to the nearest emergency room or call 911.  A surgeon from [facility] is always on call at the [facility], [address], [address], [place], [place]  [address] ? MAIN: [phone] ? TOLL FREE: [phone] ?  FAX [phone] [email or web address]    Follow with Primary MD OSEI-BONSU,GEORGE, MD in 2 days   Get CBC, INR, CMP, 2 view Chest X ray checked  by Primary MD next visit.   Follow with your primary care physician within 2 days for Coumadin dose adjustment and Lovenox dose adjustment.   Activity: As tolerated with Full fall precautions use walker/cane & assistance as needed following restrictions given by general surgery in writing.   Disposition Home    Diet: Heart Healthy   For Heart failure patients - Check your Weight same time everyday, if you gain over 2 pounds, or you develop in leg swelling, experience more shortness of breath or chest pain, call your Primary MD immediately. Follow Cardiac Low Salt Diet and 1.5 lit/day fluid restriction.   On your next visit with your primary care physician please Get Medicines reviewed and adjusted.   Please request your Prim.MD to go over all Hospital Tests and Procedure/Radiological results at the follow up, please get all Hospital records sent to your Prim MD by signing hospital release before you go home.   If you experience worsening of your admission symptoms, develop shortness of breath, life threatening emergency, suicidal or homicidal thoughts you  must seek medical attention immediately by calling 911 or calling your MD immediately  if symptoms less severe.  You Must read complete instructions/literature along with all the possible adverse reactions/side effects for all the Medicines you take and that have been prescribed to you. Take any new Medicines after you have completely understood and accpet all the possible adverse reactions/side effects.   Do not drive, operating heavy machinery, perform activities at heights, swimming or participation in water activities or provide baby sitting services if your were admitted for syncope or siezures until you have seen by Primary MD or a Neurologist and advised to do so again.  Do not drive when taking Pain medications.    Do not take more than prescribed Pain, Sleep and Anxiety Medications  Special Instructions: If you  have smoked or chewed Tobacco  in the last 2 yrs please stop smoking, stop any regular Alcohol  and or any Recreational drug use.  Wear Seat belts while driving.   Please note  You were cared for by a hospitalist during your hospital stay. If you have any questions about your discharge medications or the care you received while you were in the hospital after you are discharged, you can call the unit and asked to speak with the hospitalist on call if the hospitalist that took care of you is not available. Once you are discharged, your primary care physician will handle any further medical issues. Please note that NO REFILLS for any discharge medications will be authorized once you are discharged, as it is imperative that you return to your primary care physician (or establish a relationship with a primary care physician if you do not have one) for your aftercare needs so that they can reassess your need for medications and monitor your lab values.

## 2015-04-21 NOTE — Evaluation (Signed)
Physical Therapy Evaluation Patient Details Name: Alice [name] MRN: [id number] DOB: Nov 22, 1970 Today's Date: 04/21/2015   History of Present Illness  This is a 44 year old morbidly obese African-American female with history of DVT in the past, essential hypertension was admitted to the hospital with acute cholecystitis, she was seen by general surgery underwent laparoscopic cholecystectomy on 04/18/2015. She has tolerated the procedure well, per general surgery she is to be kept one more day as she still has mild nausea and has been relatively immobile since admission  Clinical Impression   Patient is s/p above surgery resulting in functional limitations due to the deficits listed below (see PT Problem List).  Patient will benefit from skilled PT to increase their independence and safety with mobility to allow discharge to the venue listed below.       Follow Up Recommendations No PT follow up;Supervision - Intermittent    Equipment Recommendations  Rolling walker with 5" wheels (Bari sized)    Recommendations for Other Services       Precautions / Restrictions Precautions Precaution Comments: Cued to self-monitor for activity tolerance      Mobility  Bed Mobility Overal bed mobility: Needs Assistance Bed Mobility: Supine to Sit     Supine to sit: Supervision     General bed mobility comments: Slow moving and used rails; supervision for safety  Transfers Overall transfer level: Needs assistance Equipment used: Rolling walker (2 wheeled) Transfers: Sit to/from Stand Sit to Stand: Supervision         General transfer comment: Cues for hand placement  Ambulation/Gait Ambulation/Gait assistance: Supervision Ambulation Distance (Feet): 250 Feet Assistive device: Rolling walker (2 wheeled) Gait Pattern/deviations: Step-through pattern;Decreased stride length   Gait velocity interpretation: Below normal speed for age/gender General Gait Details: Cues to self-monitor  for activity tolerance; very painful and slow moving, but not needing physical asssit  Stairs            Wheelchair Mobility    Modified Rankin (Stroke Patients Only)       Balance                                             Pertinent Vitals/Pain Pain Assessment: 0-10 Pain Score: 7  Pain Location: Abdominal pain R side Pain Descriptors / Indicators: Aching;Grimacing Pain Intervention(s): Monitored during session;Repositioned  Used pillow to splint and support painful R side    Home Living Family/patient expects to be discharged to:: Private residence Living Arrangements: Alone Available Help at Discharge: Family;Available PRN/intermittently Type of Home: Apartment Home Access: Stairs to enter Entrance Stairs-Rails: Right Entrance Stairs-Number of Steps: 3 Home Layout: One level        Prior Function Level of Independence: Independent               Hand Dominance        Extremity/Trunk Assessment   Upper Extremity Assessment: Overall WFL for tasks assessed           Lower Extremity Assessment: Overall WFL for tasks assessed (though limited hip range due to body habitus)         Communication   Communication: No difficulties  Cognition Arousal/Alertness: Awake/alert Behavior During Therapy: WFL for tasks assessed/performed;Flat affect Overall Cognitive Status: Within Functional Limits for tasks assessed  General Comments      Exercises        Assessment/Plan    PT Assessment Patient needs continued PT services  PT Diagnosis Difficulty walking;Acute pain   PT Problem List Decreased activity tolerance;Decreased mobility;Decreased knowledge of use of DME;Pain  PT Treatment Interventions DME instruction;Gait training;Stair training;Functional mobility training;Therapeutic activities;Therapeutic exercise;Patient/family education   PT Goals (Current goals can be found in the Care Plan  section) Acute Rehab PT Goals Patient Stated Goal: did not state PT Goal Formulation: With patient Time For Goal Achievement: 04/28/15 Potential to Achieve Goals: Good    Frequency Min 3X/week   Barriers to discharge        Co-evaluation               End of Session   Activity Tolerance: Patient tolerated treatment well;Patient limited by pain Patient left: in chair;with call bell/phone within reach;with nursing/sitter in room Nurse Communication: Mobility status         Time: 1020-1040 PT Time Calculation (min) (ACUTE ONLY): 20 min   Charges:   PT Evaluation $Initial PT Evaluation Tier I: 1 Procedure     PT G CodesRoney Marion Hamff 04/21/2015, 11:14 AM  Roney Marion, PT  Acute Rehabilitation Services Pager [phone] Office [phone]

## 2015-04-21 NOTE — Care Management Note (Signed)
Case Management Note  Patient Details  Name: Alice [name] MRN: [id number] Date of Birth: Sep 26, 1970  Subjective/Objective:                    Action/Plan:  PT not recommending HHPT , spoke with patient she has given Lovenox injections in the past . Bedside nurse will have patient self adm Lovenox this am before discharge. Patient does want walker PT recommended . Called [facility] and ordered walker . No further needs identified .  Expected Discharge Date:                  Expected Discharge Plan:  [place]  In-House Referral:     Discharge planning Services  CM Consult  Post Acute Care Choice:  Durable Medical Equipment, Home Health Choice offered to:  Patient  DME Arranged:    DME Agency:     HH Arranged:    [place] Agency:     Status of Service:  In process, will continue to follow  Medicare Important Message Given:  Yes Date Medicare IM Given:    Medicare IM give by:    Date Additional Medicare IM Given:    Additional Medicare Important Message give by:     If discussed at [place] of Stay Meetings, dates discussed:    Additional Comments:  Marilu Favre, RN 04/21/2015, 11:00 AM

## 2015-04-21 NOTE — Progress Notes (Signed)
ANTICOAGULATION CONSULT NOTE - Follow Up Consult  Pharmacy Consult for Coumadin and Lovenox Indication: Hx of DVT  Allergies  Allergen Reactions  . Oxycodone Hcl Nausea Only    Patient Measurements: Height: 5\' 8"  (172.7 cm) (03/01/2015) Weight: (!) 374 lb 9 oz (169.9 kg) IBW/kg (Calculated) : 63.9  Vital Signs: Temp: 99.1 F (37.3 C) (12/06 0520) Temp Source: Oral (12/06 0520) BP: 129/49 mmHg (12/06 0520) Pulse Rate: 96 (12/06 0520)  Labs:  Recent Labs  04/19/15 0512 04/20/15 0416 04/21/15 0555  HGB 11.7* 12.1 11.1*  HCT 35.5* 35.7* 32.1*  PLT 221 280 287  HEPARINUNFRC 0.19*  --   --   CREATININE 1.33* 1.23*  --     Estimated Creatinine Clearance: 99 mL/min (by C-G formula based on Cr of 1.23).   Assessment: 44 yo female presenting with complaints of chest pain since last night. Pt has been seen for similar pain a few weeks ago and was told it was GERD. Found to have acute cholecystitis.   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 She was on coumadin 10mg  daily PTA and came in with an INR of 1.91. Coumadin held - given 5mg  Vit. K. Had cholecystectomy on 12/3 Last INR was on 12/3 at 1.41. Now restarted on coumadin on 12/5. Hgb stable a 11.1, plts wnl. No s/s of bleed.  Goal of Therapy:  INR 2-3 Monitor platelets by anticoagulation protocol: Yes   Plan:  Continue enoxaparin 170mg  [place] Q12 Give coumadin 10mg  PO x 1 tonight  Monitor daily INR, CBC, s/s of bleed  Elenor Quinones, PharmD, [facility] Clinical Pharmacist Pager [phone] 04/21/2015 8:32 AM

## 2015-04-21 NOTE — Progress Notes (Signed)
Discussed discharge summary with patient. Reviewed all medications with patient. Patient received Rx. Patient ready for discharge. 

## 2015-04-21 NOTE — Progress Notes (Signed)
Patient ID: Alice [name], female   DOB: February 02, 1971, 44 y.o.   MRN: [id number]     [facility]      [address] [place], [place] [address]    Phone: [phone] FAX: [phone]     Subjective: Better today. No n/v. No flatus. Walked x1 but sat up in a chair all day.   Objective:  Vital signs:  Filed Vitals:   04/20/15 0506 04/20/15 1418 04/20/15 2323 04/21/15 0520  BP: 117/62 125/80 136/76 129/49  Pulse: 90 83 92 96  Temp: 98.3 F (36.8 C) 98.8 F (37.1 C) 99 F (37.2 C) 99.1 F (37.3 C)  TempSrc: Oral Oral Oral Oral  Resp: 19 16 16 18   Height:      Weight: 169.9 kg (374 lb 9 oz)   169.9 kg (374 lb 9 oz)  SpO2: 94% 100% 95% 96%    Last BM Date: 04/16/15  Intake/Output   Yesterday:  12/05 0701 - 12/06 0700 In: 1200 [P.O.:1200] Out: 1400 [Urine:1400] This shift: I/O last 3 completed shifts: In: 3378.8 [P.O.:1800; I.V.:1578.8] Out: 2100 [Urine:2100]    Physical Exam: General: Pt awake/alert/oriented x4 in no acute distress Abdomen: Soft. Nondistended. Mildly tender at incisions only. Incisions are c/d/i. No evidence of peritonitis. No incarcerated hernias.     Problem List:   Principal Problem:   Acute bilateral upper abdominal pain Active Problems:   OBESITY, MORBID   HTN (hypertension)   History of pulmonary embolism   History of DVT (deep vein thrombosis)   GASTROESOPHAGEAL REFLUX DISEASE   OSA (obstructive sleep apnea)   CKD (chronic kidney disease), stage III   Anemia   Symptomatic cholelithiasis    Results:   Labs: Results for orders placed or performed during the hospital encounter of 04/16/15 (from the past 48 hour(s))  Osmolality     Status: None   Collection Time: 04/19/15  8:17 AM  Result Value Ref Range   Osmolality 288 275 - 295 mOsm/kg    Comment: Please note change in reference range.  Creatinine, urine, random     Status: None   Collection Time: 04/19/15 11:12 AM  Result  Value Ref Range   Creatinine, Urine 286.51 mg/dL  Osmolality, urine     Status: None   Collection Time: 04/19/15 11:12 AM  Result Value Ref Range   Osmolality, Ur 435 300 - 900 mOsm/kg    Comment: Please note change in reference range.  Sodium, urine, random     Status: None   Collection Time: 04/19/15 11:12 AM  Result Value Ref Range   Sodium, Ur 12 mmol/L  CBC     Status: Abnormal   Collection Time: 04/20/15  4:16 AM  Result Value Ref Range   WBC 15.6 (H) 4.0 - 10.5 K/uL   RBC 4.41 3.87 - 5.11 MIL/uL   Hemoglobin 12.1 12.0 - 15.0 g/dL   HCT 35.7 (L) 36.0 - 46.0 %   MCV 81.0 78.0 - 100.0 fL   MCH 27.4 26.0 - 34.0 pg   MCHC 33.9 30.0 - 36.0 g/dL   RDW 16.0 (H) 11.5 - 15.5 %   Platelets 280 150 - 400 K/uL  Comprehensive metabolic panel     Status: Abnormal   Collection Time: 04/20/15  4:16 AM  Result Value Ref Range   Sodium 137 135 - 145 mmol/L   Potassium 4.2 3.5 - 5.1 mmol/L   Chloride 105 101 - 111 mmol/L   CO2 24  22 - 32 mmol/L   Glucose, Bld 86 65 - 99 mg/dL   BUN 9 6 - 20 mg/dL   Creatinine, Ser 1.23 (H) 0.44 - 1.00 mg/dL   Calcium 8.6 (L) 8.9 - 10.3 mg/dL   Total Protein 7.3 6.5 - 8.1 g/dL   Albumin 2.3 (L) 3.5 - 5.0 g/dL   AST 58 (H) 15 - 41 U/L   ALT 99 (H) 14 - 54 U/L   Alkaline Phosphatase 136 (H) 38 - 126 U/L   Total Bilirubin 1.2 0.3 - 1.2 mg/dL   GFR calc non Af Amer 53 (L) >60 mL/min   GFR calc Af Amer >60 >60 mL/min    Comment: (NOTE) The eGFR has been calculated using the CKD EPI equation. This calculation has not been validated in all clinical situations. eGFR's persistently <60 mL/min signify possible Chronic Kidney Disease.    Anion gap 8 5 - 15  CBC     Status: Abnormal   Collection Time: 04/21/15  5:55 AM  Result Value Ref Range   WBC 11.7 (H) 4.0 - 10.5 K/uL   RBC 4.01 3.87 - 5.11 MIL/uL   Hemoglobin 11.1 (L) 12.0 - 15.0 g/dL   HCT 32.1 (L) 36.0 - 46.0 %   MCV 80.0 78.0 - 100.0 fL   MCH 27.7 26.0 - 34.0 pg   MCHC 34.6 30.0 - 36.0 g/dL    RDW 15.8 (H) 11.5 - 15.5 %   Platelets 287 150 - 400 K/uL    Imaging / Studies: Dg Chest Port 1 View  04/19/2015  CLINICAL DATA:  Shortness of breath EXAM: PORTABLE CHEST 1 VIEW COMPARISON:  04/16/2015 FINDINGS: Low lung volumes. No focal consolidation. No pleural effusion or pneumothorax. Cardiomegaly. IMPRESSION: No evidence of acute cardiopulmonary disease. Electronically Signed   By: Julian Hy M.D.   On: 04/19/2015 16:31    Medications / Allergies:  Scheduled Meds: . cefTRIAXone (ROCEPHIN)  IV  2 g Intravenous Q24H  . docusate sodium  100 mg Oral BID  . enoxaparin (LOVENOX) injection  1 mg/kg Subcutaneous Q12H  . metoprolol tartrate  50 mg Oral BID  . pantoprazole (PROTONIX) IV  40 mg Intravenous Q12H  . polyethylene glycol  17 g Oral Daily  . Warfarin - Pharmacist Dosing Inpatient   Does not apply q1800   Continuous Infusions:  PRN Meds:.acetaminophen **OR** [DISCONTINUED] acetaminophen, hydrALAZINE, HYDROmorphone (DILAUDID) injection, metoprolol, ondansetron (ZOFRAN) IV, oxyCODONE-acetaminophen, promethazine  Antibiotics: Anti-infectives    Start     Dose/Rate Route Frequency Ordered Stop   04/18/15 0800  cefTRIAXone (ROCEPHIN) 2 g in dextrose 5 % 50 mL IVPB    Comments:  Pharmacy may adjust dosing strength / duration / interval for maximal efficacy   2 g 100 mL/hr over 30 Minutes Intravenous Every 24 hours 04/17/15 1248     04/17/15 1230  Ampicillin-Sulbactam (UNASYN) 3 g in sodium chloride 0.9 % 100 mL IVPB  Status:  Discontinued     3 g 100 mL/hr over 60 Minutes Intravenous Every 6 hours 04/17/15 1215 04/17/15 1248   04/17/15 1000  cefTRIAXone (ROCEPHIN) 1 g in dextrose 5 % 50 mL IVPB  Status:  Discontinued     1 g 100 mL/hr over 30 Minutes Intravenous Every 24 hours 04/16/15 1439 04/17/15 1144   04/16/15 1115  cefTRIAXone (ROCEPHIN) 1 g in dextrose 5 % 50 mL IVPB  Status:  Discontinued     1 g 100 mL/hr over 30 Minutes Intravenous Every 24 hours 04/16/15 1110  04/16/15 1438         Assessment/Plan POD#3 laparoscopic cholecystectomy---Dr. Rosendo Gros -encourage mobilization.  Tolerating fulls, advance to solids -miralax/colace -may DC from a surgical standpoint DVT-coumadin Dispo-follow up arranged.    Erby Pian, [facility] Pager [phone]) For consults and floor pages call [phone]([id number]-4:30P)  04/21/2015 7:54 AM

## 2015-04-23 DIAGNOSIS — G8918 Other acute postprocedural pain: Secondary | ICD-10-CM | POA: Diagnosis not present

## 2015-04-23 DIAGNOSIS — I10 Essential (primary) hypertension: Secondary | ICD-10-CM | POA: Diagnosis not present

## 2015-04-23 DIAGNOSIS — E559 Vitamin D deficiency, unspecified: Secondary | ICD-10-CM | POA: Diagnosis not present

## 2015-04-23 DIAGNOSIS — F329 Major depressive disorder, single episode, unspecified: Secondary | ICD-10-CM | POA: Diagnosis not present

## 2015-04-23 DIAGNOSIS — R1013 Epigastric pain: Secondary | ICD-10-CM | POA: Diagnosis not present

## 2015-04-23 DIAGNOSIS — G4733 Obstructive sleep apnea (adult) (pediatric): Secondary | ICD-10-CM | POA: Diagnosis not present

## 2015-04-28 ENCOUNTER — Ambulatory Visit: Payer: Medicare Other

## 2015-05-01 ENCOUNTER — Encounter (HOSPITAL_COMMUNITY): Payer: Self-pay | Admitting: *Deleted

## 2015-05-01 DIAGNOSIS — Z7901 Long term (current) use of anticoagulants: Secondary | ICD-10-CM

## 2015-05-01 DIAGNOSIS — Z9049 Acquired absence of other specified parts of digestive tract: Secondary | ICD-10-CM

## 2015-05-01 DIAGNOSIS — F419 Anxiety disorder, unspecified: Secondary | ICD-10-CM | POA: Diagnosis present

## 2015-05-01 DIAGNOSIS — Z86711 Personal history of pulmonary embolism: Secondary | ICD-10-CM

## 2015-05-01 DIAGNOSIS — G4730 Sleep apnea, unspecified: Secondary | ICD-10-CM | POA: Diagnosis present

## 2015-05-01 DIAGNOSIS — T814XXA Infection following a procedure, initial encounter: Secondary | ICD-10-CM | POA: Diagnosis not present

## 2015-05-01 DIAGNOSIS — G4733 Obstructive sleep apnea (adult) (pediatric): Secondary | ICD-10-CM | POA: Diagnosis present

## 2015-05-01 DIAGNOSIS — Z86718 Personal history of other venous thrombosis and embolism: Secondary | ICD-10-CM

## 2015-05-01 DIAGNOSIS — I129 Hypertensive chronic kidney disease with stage 1 through stage 4 chronic kidney disease, or unspecified chronic kidney disease: Secondary | ICD-10-CM | POA: Diagnosis present

## 2015-05-01 DIAGNOSIS — F329 Major depressive disorder, single episode, unspecified: Secondary | ICD-10-CM | POA: Diagnosis present

## 2015-05-01 DIAGNOSIS — N183 Chronic kidney disease, stage 3 (moderate): Secondary | ICD-10-CM | POA: Diagnosis present

## 2015-05-01 DIAGNOSIS — R1011 Right upper quadrant pain: Principal | ICD-10-CM | POA: Diagnosis present

## 2015-05-01 DIAGNOSIS — N390 Urinary tract infection, site not specified: Secondary | ICD-10-CM | POA: Diagnosis present

## 2015-05-01 DIAGNOSIS — K219 Gastro-esophageal reflux disease without esophagitis: Secondary | ICD-10-CM | POA: Diagnosis present

## 2015-05-01 DIAGNOSIS — Z6841 Body Mass Index (BMI) 40.0 and over, adult: Secondary | ICD-10-CM

## 2015-05-01 DIAGNOSIS — K5900 Constipation, unspecified: Secondary | ICD-10-CM | POA: Diagnosis present

## 2015-05-01 LAB — POC URINE PREG, ED: Preg Test, Ur: NEGATIVE

## 2015-05-01 MED ORDER — ONDANSETRON 4 MG PO TBDP
8.0000 mg | ORAL_TABLET | Freq: Once | ORAL | Status: AC
  Administered 2015-05-01: 8 mg via ORAL
  Filled 2015-05-01: qty 2

## 2015-05-01 MED ORDER — OXYCODONE-ACETAMINOPHEN 5-325 MG PO TABS
1.0000 | ORAL_TABLET | Freq: Once | ORAL | Status: AC
  Administered 2015-05-01: 1 via ORAL
  Filled 2015-05-01: qty 1

## 2015-05-01 NOTE — ED Notes (Signed)
The pt had gallbladder suurgery on the 3rd.  Pain since then  But today she has had pain all day long  She last had a oxydocone 7.5 mg po at 2000

## 2015-05-02 ENCOUNTER — Inpatient Hospital Stay (HOSPITAL_COMMUNITY): Payer: Medicare Other

## 2015-05-02 ENCOUNTER — Emergency Department (HOSPITAL_COMMUNITY): Payer: Medicare Other

## 2015-05-02 ENCOUNTER — Inpatient Hospital Stay (HOSPITAL_COMMUNITY)
Admission: EM | Admit: 2015-05-02 | Discharge: 2015-05-04 | DRG: 392 | Disposition: A | Discharge status: Home / Self Care | Payer: Medicare Other | Attending: Surgery | Admitting: Surgery

## 2015-05-02 ENCOUNTER — Encounter (HOSPITAL_COMMUNITY): Payer: Self-pay

## 2015-05-02 DIAGNOSIS — T8140XA Infection following a procedure, unspecified, initial encounter: Secondary | ICD-10-CM

## 2015-05-02 DIAGNOSIS — Z86718 Personal history of other venous thrombosis and embolism: Secondary | ICD-10-CM | POA: Diagnosis not present

## 2015-05-02 DIAGNOSIS — Z6841 Body Mass Index (BMI) 40.0 and over, adult: Secondary | ICD-10-CM | POA: Diagnosis not present

## 2015-05-02 DIAGNOSIS — K838 Other specified diseases of biliary tract: Secondary | ICD-10-CM

## 2015-05-02 DIAGNOSIS — F419 Anxiety disorder, unspecified: Secondary | ICD-10-CM | POA: Diagnosis present

## 2015-05-02 DIAGNOSIS — R109 Unspecified abdominal pain: Secondary | ICD-10-CM | POA: Diagnosis present

## 2015-05-02 DIAGNOSIS — K5900 Constipation, unspecified: Secondary | ICD-10-CM | POA: Diagnosis present

## 2015-05-02 DIAGNOSIS — Z9049 Acquired absence of other specified parts of digestive tract: Secondary | ICD-10-CM | POA: Diagnosis not present

## 2015-05-02 DIAGNOSIS — Z7901 Long term (current) use of anticoagulants: Secondary | ICD-10-CM | POA: Diagnosis not present

## 2015-05-02 DIAGNOSIS — K219 Gastro-esophageal reflux disease without esophagitis: Secondary | ICD-10-CM | POA: Diagnosis present

## 2015-05-02 DIAGNOSIS — T814XXA Infection following a procedure, initial encounter: Secondary | ICD-10-CM | POA: Diagnosis not present

## 2015-05-02 DIAGNOSIS — R1011 Right upper quadrant pain: Secondary | ICD-10-CM | POA: Diagnosis not present

## 2015-05-02 DIAGNOSIS — N390 Urinary tract infection, site not specified: Secondary | ICD-10-CM | POA: Diagnosis present

## 2015-05-02 DIAGNOSIS — G4730 Sleep apnea, unspecified: Secondary | ICD-10-CM | POA: Diagnosis present

## 2015-05-02 DIAGNOSIS — I129 Hypertensive chronic kidney disease with stage 1 through stage 4 chronic kidney disease, or unspecified chronic kidney disease: Secondary | ICD-10-CM | POA: Diagnosis present

## 2015-05-02 DIAGNOSIS — K9189 Other postprocedural complications and disorders of digestive system: Secondary | ICD-10-CM

## 2015-05-02 DIAGNOSIS — G4733 Obstructive sleep apnea (adult) (pediatric): Secondary | ICD-10-CM | POA: Diagnosis present

## 2015-05-02 DIAGNOSIS — F329 Major depressive disorder, single episode, unspecified: Secondary | ICD-10-CM | POA: Diagnosis present

## 2015-05-02 DIAGNOSIS — N183 Chronic kidney disease, stage 3 (moderate): Secondary | ICD-10-CM | POA: Diagnosis present

## 2015-05-02 DIAGNOSIS — Z86711 Personal history of pulmonary embolism: Secondary | ICD-10-CM | POA: Diagnosis not present

## 2015-05-02 LAB — URINE MICROSCOPIC-ADD ON

## 2015-05-02 LAB — CBC WITH DIFFERENTIAL/PLATELET
Basophils Absolute: 0.1 10*3/uL (ref 0.0–0.1)
Basophils Relative: 1 %
EOS PCT: 13 %
Eosinophils Absolute: 1.1 10*3/uL — ABNORMAL HIGH (ref 0.0–0.7)
HEMATOCRIT: 39.5 % (ref 36.0–46.0)
HEMOGLOBIN: 12.9 g/dL (ref 12.0–15.0)
LYMPHS PCT: 28 %
Lymphs Abs: 2.3 10*3/uL (ref 0.7–4.0)
MCH: 26.5 pg (ref 26.0–34.0)
MCHC: 32.7 g/dL (ref 30.0–36.0)
MCV: 81.3 fL (ref 78.0–100.0)
MONO ABS: 0.7 10*3/uL (ref 0.1–1.0)
MONOS PCT: 9 %
Neutro Abs: 4 10*3/uL (ref 1.7–7.7)
Neutrophils Relative %: 49 %
Platelets: 565 10*3/uL — ABNORMAL HIGH (ref 150–400)
RBC: 4.86 MIL/uL (ref 3.87–5.11)
RDW: 15.3 % (ref 11.5–15.5)
WBC: 8.2 10*3/uL (ref 4.0–10.5)

## 2015-05-02 LAB — PROTIME-INR
INR: 2.02 — AB (ref 0.00–1.49)
INR: 2.08 — AB (ref 0.00–1.49)
PROTHROMBIN TIME: 23.3 s — AB (ref 11.6–15.2)
Prothrombin Time: 22.8 seconds — ABNORMAL HIGH (ref 11.6–15.2)

## 2015-05-02 LAB — COMPREHENSIVE METABOLIC PANEL
ALT: 34 U/L (ref 14–54)
ANION GAP: 10 (ref 5–15)
AST: 32 U/L (ref 15–41)
Albumin: 2.9 g/dL — ABNORMAL LOW (ref 3.5–5.0)
Alkaline Phosphatase: 106 U/L (ref 38–126)
BUN: 14 mg/dL (ref 6–20)
CHLORIDE: 101 mmol/L (ref 101–111)
CO2: 28 mmol/L (ref 22–32)
Calcium: 9.6 mg/dL (ref 8.9–10.3)
Creatinine, Ser: 1.24 mg/dL — ABNORMAL HIGH (ref 0.44–1.00)
GFR, EST NON AFRICAN AMERICAN: 52 mL/min — AB (ref >60)
Glucose, Bld: 110 mg/dL — ABNORMAL HIGH (ref 65–99)
POTASSIUM: 3.9 mmol/L (ref 3.5–5.1)
Sodium: 139 mmol/L (ref 135–145)
TOTAL PROTEIN: 8.6 g/dL — AB (ref 6.5–8.1)
Total Bilirubin: 0.2 mg/dL — ABNORMAL LOW (ref 0.3–1.2)

## 2015-05-02 LAB — URINALYSIS, ROUTINE W REFLEX MICROSCOPIC
Bilirubin Urine: NEGATIVE
GLUCOSE, UA: NEGATIVE mg/dL
HGB URINE DIPSTICK: NEGATIVE
KETONES UR: NEGATIVE mg/dL
Nitrite: NEGATIVE
PROTEIN: 100 mg/dL — AB
Specific Gravity, Urine: 1.019 (ref 1.005–1.030)
pH: 5.5 (ref 5.0–8.0)

## 2015-05-02 LAB — LIPASE, BLOOD: LIPASE: 47 U/L (ref 11–51)

## 2015-05-02 MED ORDER — TECHNETIUM TC 99M MEBROFENIN IV KIT
5.4000 | PACK | Freq: Once | INTRAVENOUS | Status: AC | PRN
  Administered 2015-05-02: 5 via INTRAVENOUS

## 2015-05-02 MED ORDER — ONDANSETRON HCL 4 MG/2ML IJ SOLN
4.0000 mg | Freq: Four times a day (QID) | INTRAMUSCULAR | Status: DC | PRN
Start: 2015-05-02 — End: 2015-05-04

## 2015-05-02 MED ORDER — CIPROFLOXACIN IN D5W 400 MG/200ML IV SOLN
400.0000 mg | Freq: Two times a day (BID) | INTRAVENOUS | Status: DC
  Administered 2015-05-02 – 2015-05-03 (×4): 400 mg via INTRAVENOUS
  Filled 2015-05-02 (×6): qty 200

## 2015-05-02 MED ORDER — HYDROMORPHONE HCL 1 MG/ML IJ SOLN
1.0000 mg | Freq: Once | INTRAMUSCULAR | Status: AC
  Administered 2015-05-02: 1 mg via INTRAVENOUS
  Filled 2015-05-02: qty 1

## 2015-05-02 MED ORDER — SODIUM CHLORIDE 0.9 % IV BOLUS (SEPSIS)
1000.0000 mL | Freq: Once | INTRAVENOUS | Status: AC
Start: 2015-05-02 — End: 2015-05-02
  Administered 2015-05-02: 1000 mL via INTRAVENOUS

## 2015-05-02 MED ORDER — PANTOPRAZOLE SODIUM 40 MG IV SOLR
40.0000 mg | Freq: Every day | INTRAVENOUS | Status: DC
Start: 2015-05-02 — End: 2015-05-04
  Administered 2015-05-02 – 2015-05-03 (×2): 40 mg via INTRAVENOUS
  Filled 2015-05-02 (×2): qty 40

## 2015-05-02 MED ORDER — TECHNETIUM TC 99M MEBROFENIN IV KIT: 5.0000 | PACK | Freq: Once | INTRAVENOUS | Status: DC | PRN

## 2015-05-02 MED ORDER — IOHEXOL 300 MG/ML  SOLN
100.0000 mL | Freq: Once | INTRAMUSCULAR | Status: AC | PRN
  Administered 2015-05-02: 100 mL via INTRAVENOUS

## 2015-05-02 MED ORDER — HYDROCODONE-ACETAMINOPHEN 7.5-325 MG PO TABS
1.0000 | ORAL_TABLET | Freq: Four times a day (QID) | ORAL | Status: DC | PRN
  Administered 2015-05-03 – 2015-05-04 (×2): 1 via ORAL
  Filled 2015-05-02 (×2): qty 1

## 2015-05-02 MED ORDER — AMLODIPINE BESYLATE 10 MG PO TABS
10.0000 mg | ORAL_TABLET | Freq: Every day | ORAL | Status: DC
Start: 2015-05-02 — End: 2015-05-04
  Administered 2015-05-02 – 2015-05-03 (×2): 10 mg via ORAL
  Filled 2015-05-02 (×2): qty 1

## 2015-05-02 MED ORDER — HYDROMORPHONE HCL 1 MG/ML IJ SOLN
0.5000 mg | INTRAMUSCULAR | Status: DC | PRN
  Administered 2015-05-02 – 2015-05-03 (×4): 1 mg via INTRAVENOUS
  Filled 2015-05-02 (×4): qty 1

## 2015-05-02 MED ORDER — POTASSIUM CHLORIDE IN NACL 20-0.9 MEQ/L-% IV SOLN
INTRAVENOUS | Status: DC
Start: 2015-05-02 — End: 2015-05-04
  Administered 2015-05-02: 13:00:00 via INTRAVENOUS
  Filled 2015-05-02 (×6): qty 1000

## 2015-05-02 MED ORDER — ONDANSETRON 4 MG PO TBDP
4.0000 mg | ORAL_TABLET | Freq: Four times a day (QID) | ORAL | Status: DC | PRN
Start: 2015-05-02 — End: 2015-05-04

## 2015-05-02 NOTE — ED Notes (Signed)
Spoke with [facility] states will transport patient to [facility]. This nurse walked patient belonging including purse to [facility] nursing secretory placed belongings behind the nursing station.

## 2015-05-02 NOTE — Progress Notes (Signed)
ANTICOAGULATION CONSULT NOTE - Initial Consult  Pharmacy Consult for heparin Indication: pulmonary embolus and DVT  Allergies  Allergen Reactions  . Oxycodone Hcl Nausea Only    Patient Measurements:    Vital Signs: Temp: 97.9 F (36.6 C) (12/16 2314) Temp Source: Oral (12/16 2314) BP: 108/61 mmHg (12/17 0845) Pulse Rate: 79 (12/17 0845)  Labs:  Recent Labs  05/01/15 2347 05/02/15 0312  HGB 12.9  --   HCT 39.5  --   PLT 565*  --   LABPROT  --  22.8*  INR  --  2.02*  CREATININE 1.24*  --     Estimated Creatinine Clearance: 98.2 mL/min (by C-G formula based on Cr of 1.24).   Medical History: Past Medical History  Diagnosis Date  . Hypertension   . Varicose veins of right lower extremity   . Anemia   . GERD (gastroesophageal reflux disease)   . DVT (deep venous thrombosis) (HCC)     BLE  . Pulmonary embolism ([place])   . Obesity   . Sinusitis nasal   . Pneumonia 2014  . Chronic bronchitis ([place])   . Sleep apnea     "I'm suppose to wear a mask but I don't" (04/16/2015)  . Headache     "weekly" (04/16/2015)  . Migraine     "monthly" (04/16/2015)  . Chronic upper back pain   . Anxiety   . Depression    Assessment: 72 yof presented to the ED with abdominal pain s/p cholecystectomy on 12/3. She is on chronic coumadin for history of PE and DVT. Currently being bridged with lovenox until INR is therapeutic. INR is therapeutic this AM at 2.02. However, pt take her dose of coumadin yesterday so INR this AM may not reflect the last dose of coumadin. H/H is WNL and platelets are elevated. Heparin to start while lovenox and coumadin are on hold for GI work-up.   Goal of Therapy:  Heparin level 0.3-0.7 units/ml Monitor platelets by anticoagulation protocol: Yes   Plan:  - Check an INR at 1800 tonight to determine if INR is trending up or will be subtherapeutic  - If INR is <2, will plan to start heparin at 1600 units/hr and check an 8 hour heparin level - Daily heparin  level and CBC - F/u surgery plans  Sadae Arrazola, Rande Lawman 05/02/2015,9:17 AM

## 2015-05-02 NOTE — ED Provider Notes (Signed)
CSN: [id number]     Arrival date & time 05/01/15  2300 History   By signing my name below, I, Evelene Croon, attest that this documentation has been prepared under the direction and in the presence of Everlene Balls, MD . Electronically Signed: Evelene Croon, Scribe. 05/02/2015. 3:44 AM.   Chief Complaint  Patient presents with  . Abdominal Pain    The history is provided by the patient. No language interpreter was used.     HPI Comments:  Alice [name] is a 44 y.o. female with a history of DVT and PE, who presents to the Emergency Department s/p cholecystectomy on12/3/16  complaining of increased RUQ abdominal pain. She states she's had pain at the site since discharge but her pain worsened today. She reports associated back pain and intermittent subjective fever. Pt states she has taken percocet without relief. No alleviating factors noted. Pt is currently on  on coumadin.  Surgeon- Ralene Ok  Past Medical History  Diagnosis Date  . Hypertension   . Varicose veins of right lower extremity   . Anemia   . GERD (gastroesophageal reflux disease)   . DVT (deep venous thrombosis) (HCC)     BLE  . Pulmonary embolism ([place])   . Obesity   . Sinusitis nasal   . Pneumonia 2014  . Chronic bronchitis ([place])   . Sleep apnea     "I'm suppose to wear a mask but I don't" (04/16/2015)  . Headache     "weekly" (04/16/2015)  . Migraine     "monthly" (04/16/2015)  . Chronic upper back pain   . Anxiety   . Depression    Past Surgical History  Procedure Laterality Date  . Incision and drainage Right 09/10/2008    leg:  skin and soft tissue and muscle/notes 09/15/2010  . Laparoscopic gastric bypass  ~ 2007  . Incision and drainage Right 01/01/2008    Chronic venous stasis insufficiency ulcer,/notes 09/14/2010/  . Incision and drainage Right [id number]    calf w/application wound vac/notes 07/20/2010  . Hysteroscopy w/d&c  12/24/2001    Archie Endo 09/28/2010  . Tonsillectomy    . Cholecystectomy N/A  04/18/2015    Procedure: LAPAROSCOPIC CHOLECYSTECTOMY;  Surgeon: Ralene Ok, MD;  Location: [facility] OR;  Service: General;  Laterality: N/A;   Family History  Problem Relation Age of Onset  . Kidney disease Mother     kidney transplant   Social History  Substance Use Topics  . Smoking status: Never Smoker   . Smokeless tobacco: Never Used  . Alcohol Use: 2.4 oz/week    4 Glasses of wine per week   OB History    Gravida Para Term Preterm AB TAB SAB Ectopic Multiple Living   0 0 0 0 0 0 0 0 0 0      Review of Systems  10 systems reviewed and all are negative for acute change except as noted in the HPI.   Allergies  Oxycodone hcl  Home Medications   Prior to Admission medications   Medication Sig Start Date End Date Taking? Authorizing Provider  amLODipine (NORVASC) 10 MG tablet Take 1 tablet (10 mg total) by mouth daily. 05/20/13  Yes [name], MD  enoxaparin (LOVENOX) 100 MG/ML injection Inject 1.5 mLs (150 mg total) into the skin every 12 (twelve) hours. Get INR checked by her primary care physician in 2 days and get Coumadin and Lovenox dose adjusted. 04/21/15  Yes Thurnell Lose, MD  hydrochlorothiazide (HYDRODIURIL) 25 MG tablet  Take 25 mg by mouth daily.   Yes Historical Provider, MD  oxyCODONE-acetaminophen (PERCOCET/ROXICET) 5-325 MG tablet Take 1-2 tablets by mouth every 4 (four) hours as needed for severe pain. 04/21/15  Yes Thurnell Lose, MD  warfarin (COUMADIN) 2.5 MG tablet Take 5 tablets (12.5 mg total) by mouth one time only at 6 PM. Take daily until INR checked and then per PCP. Patient taking differently: Take 10 mg by mouth daily.  05/20/13  Yes [name], MD  docusate sodium (COLACE) 100 MG capsule Take 1 capsule (100 mg total) by mouth 2 (two) times daily. Patient not taking: Reported on 04/05/2014 11/25/13   Allen Norris, MD  metoCLOPramide (REGLAN) 10 MG tablet Take 1 tablet (10 mg total) by mouth every 6 (six) hours as needed for nausea or  vomiting. Patient not taking: Reported on 04/16/2015 03/01/15   Clayton Bibles, PA-C  metroNIDAZOLE (FLAGYL) 500 MG tablet Take 4 tablets (2,000 mg total) by mouth once. Patient not taking: Reported on 04/05/2014 11/25/13   Allen Norris, MD  pantoprazole (PROTONIX) 20 MG tablet Take 1 tablet (20 mg total) by mouth daily. Patient not taking: Reported on 04/16/2015 03/01/15   Clayton Bibles, PA-C   BP 134/94 mmHg  Pulse 89  Temp(Src) 97.9 F (36.6 C) (Oral)  Resp 18  SpO2 100% Physical Exam  Constitutional: She is oriented to person, place, and time. She appears well-developed and well-nourished. No distress.  HENT:  Head: Normocephalic and atraumatic.  Nose: Nose normal.  Mouth/Throat: Oropharynx is clear and moist. No oropharyngeal exudate.  Eyes: Conjunctivae and EOM are normal. Pupils are equal, round, and reactive to light. No scleral icterus.  Neck: Normal range of motion. Neck supple. No JVD present. No tracheal deviation present. No thyromegaly present.  Cardiovascular: Regular rhythm and normal heart sounds.  Tachycardia present.  Exam reveals no gallop and no friction rub.   No murmur heard. Pulmonary/Chest: Effort normal and breath sounds normal. No respiratory distress. She has no wheezes. She exhibits no tenderness.  Abdominal: Soft. Bowel sounds are normal. She exhibits no distension and no mass. There is tenderness. There is no rebound and no guarding.  Steri strips on wound site, clean dry intact  No sign of infection RUQ TTP  Musculoskeletal: Normal range of motion. She exhibits no edema or tenderness.  Lymphadenopathy:    She has no cervical adenopathy.  Neurological: She is alert and oriented to person, place, and time. No cranial nerve deficit. She exhibits normal muscle tone.  Skin: Skin is warm and dry. No rash noted. No erythema. No pallor.  Nursing note and vitals reviewed.   ED Course  Procedures   DIAGNOSTIC STUDIES:  Oxygen Saturation is 99% on RA, normal by  my interpretation.    COORDINATION OF CARE:  3:07 AM Discussed treatment plan with pt at bedside and pt agreed to plan.  Labs Review Labs Reviewed  CBC WITH DIFFERENTIAL/PLATELET - Abnormal; Notable for the following:    Platelets 565 (*)    Eosinophils Absolute 1.1 (*)    All other components within normal limits  COMPREHENSIVE METABOLIC PANEL - Abnormal; Notable for the following:    Glucose, Bld 110 (*)    Creatinine, Ser 1.24 (*)    Total Protein 8.6 (*)    Albumin 2.9 (*)    Total Bilirubin 0.2 (*)    GFR calc non Af Amer 52 (*)    All other components within normal limits  URINALYSIS, ROUTINE W REFLEX  MICROSCOPIC (NOT AT [facility]) - Abnormal; Notable for the following:    APPearance TURBID (*)    Protein, ur 100 (*)    Leukocytes, UA SMALL (*)    All other components within normal limits  URINE MICROSCOPIC-ADD ON - Abnormal; Notable for the following:    Squamous Epithelial / LPF 6-30 (*)    Bacteria, UA MANY (*)    Casts HYALINE CASTS (*)    All other components within normal limits  PROTIME-INR - Abnormal; Notable for the following:    Prothrombin Time 22.8 (*)    INR 2.02 (*)    All other components within normal limits  LIPASE, BLOOD  POC URINE PREG, ED  POC URINE PREG, ED    Imaging Review Ct Abdomen Pelvis W Contrast  05/02/2015  CLINICAL DATA:  Right-sided abdominal pain following recent cholecystectomy EXAM: CT ABDOMEN AND PELVIS WITH CONTRAST TECHNIQUE: Multidetector CT imaging of the abdomen and pelvis was performed using the standard protocol following bolus administration of intravenous contrast. CONTRAST:  159mL OMNIPAQUE IOHEXOL 300 MG/ML  SOLN COMPARISON:  04/16/2015 FINDINGS: Lung bases demonstrate minimal bibasilar atelectasis. No focal infiltrate or sizable effusion is seen. Changes consistent with prior cholecystectomy are noted. An air-fluid collection is noted in the surgical bed which may simply be postoperative in nature although the possibility of a  evolving abscess could not be totally excluded. The liver, spleen, adrenal glands and pancreas are otherwise within normal limits. Kidneys are well visualized bilaterally with a normal enhancement pattern. Postsurgical changes are noted within the stomach. The bladder is within normal limits. An IUD is noted within the pelvis. The appendix is not well seen although no inflammatory changes are noted. The bony structures are within normal limits. Soft tissue changes are noted in the anterior abdominal wall likely related to the recent cholecystectomy. IMPRESSION: Status post cholecystectomy. An air-fluid collection is noted in the gallbladder fossa. This may simply represent a postoperative fluid collection although the possibility of evolving abscess would deserve consideration. Electronically Signed   By: Inez Catalina M.D.   On: 05/02/2015 07:31   I have personally reviewed and evaluated these images and lab results as part of my medical decision-making.   EKG Interpretation None      MDM   Final diagnoses:  None   Patient presents to the ED for abdominal pain in the RUQ after recent cholecystectomy.  She admits to subj fevers as well.  She was given dilaudid and zofran for symptomatic control.  Will obtain CT scan for evaluation of her pain.    CT scan shows possible postop infection.  She has been given 2 doses of dilaudid and still in significant pain.  I spoke with general surgery who will evaluate the patient.  Dispo per surgery team.    I personally performed the services described in this documentation, which was scribed in my presence. The recorded information has been reviewed and is accurate.       Everlene Balls, MD 05/02/15 [phone]

## 2015-05-02 NOTE — H&P (Signed)
Alice [name] is an 44 y.o. female.   PCP:  Benito Mccreedy, MD  Chief Complaint: abdominal pain and fever HPI: Pt hospitalized from 12/1-12/6/16 with acute cholecystitis.  She has cholecystectomy on 04/16/15.  Post op she had ongoing nausea, perhaps some ileus, and had a slow recovery.  Now in ED with pain she says its been hurting since she went home. Pain is in RUQ and now goes to her back.  No fever, some sweats.  She has been able to eat, no nausea or vomiting with PO, but not eating like she would normally.  Last BM yesterday, but had some constipation.  PCP increased her pain meds on her visit to him.  Nothing make pain better or worse.    Work up in the ED shows she is afebrile, BP is up, labs show mild ongoing renal insuffiencey, with a normal WBC.  INR is 2.02.  UA shows a possible UTI.  CT scan shows some air fluid collection in the GB bed which they cannot tell if this is normal post op changes or early evolving abscess.  We are ask to see.  Past Medical History  Diagnosis Date  Pulmonary embolism ([place]) DVT (deep venous thrombosis) ([place])    BLE     Hypertension   Sleep apnea    "I'm suppose to wear a mask but I don't" (04/16/2015)     Obesity   Migraine   "monthly" (04/16/2015)  Chronic upper back pain  Anxiety  Depression     Varicose veins of right lower extremity   Anemia   GERD (gastroesophageal reflux disease)   Sinusitis nasal   Pneumonia 2014  Chronic bronchitis ([place])              Past Surgical History  Procedure Laterality Date  . Incision and drainage Right 09/10/2008    leg:  skin and soft tissue and muscle/notes 09/15/2010  . Laparoscopic gastric bypass  ~ 2007  . Incision and drainage Right 01/01/2008    Chronic venous stasis insufficiency ulcer,/notes 09/14/2010/  . Incision and drainage Right 10/2692    calf w/application wound vac/notes 07/20/2010  . Hysteroscopy w/d&c  12/24/2001    Archie Endo 09/28/2010  . Tonsillectomy    . Cholecystectomy N/A 04/18/2015     Procedure: LAPAROSCOPIC CHOLECYSTECTOMY;  Surgeon: Ralene Ok, MD;  Location: [facility] OR;  Service: General;  Laterality: N/A;    Family History  Problem Relation Age of Onset  . Kidney disease Mother     kidney transplant   Social History:  reports that she has never smoked. She has never used smokeless tobacco. She reports that she drinks about 2.4 oz of alcohol per week. She reports that she does not use illicit drugs. ETOH;  Weekends, but none for 2 months Tobacco:  None DRugs:  None Unemployed  Lives alone  Allergies:  Allergies  Allergen Reactions  . Oxycodone Hcl Nausea Only    Prior to Admission medications   Medication Sig Start Date End Date Taking? Authorizing Provider  amLODipine (NORVASC) 10 MG tablet Take 1 tablet (10 mg total) by mouth daily. 05/20/13  Yes [name], MD  enoxaparin (LOVENOX) 100 MG/ML injection Inject 1.5 mLs (150 mg total) into the skin every 12 (twelve) hours. Get INR checked by her primary care physician in 2 days and get Coumadin and Lovenox dose adjusted. 04/21/15  Yes Thurnell Lose, MD  hydrochlorothiazide (HYDRODIURIL) 25 MG tablet Take 25 mg by mouth daily.   Yes Historical  Provider, MD  oxyCODONE-acetaminophen (PERCOCET/ROXICET) 5-325 MG tablet Take 1-2 tablets by mouth every 4 (four) hours as needed for severe pain. 04/21/15  Yes Thurnell Lose, MD  warfarin (COUMADIN) 2.5 MG tablet Take 5 tablets (12.5 mg total) by mouth one time only at 6 PM. Take daily until INR checked and then per PCP. Patient taking differently: Take 10 mg by mouth daily.  05/20/13 Pt reports taking 4 tablets currently Yes [name], MD    Results for orders placed or performed during the hospital encounter of 05/02/15 (from the past 48 hour(s))  POC Urine Pregnancy, ED (do NOT order at [facility])     Status: None   Collection Time: 05/01/15 11:44 PM  Result Value Ref Range   Preg Test, Ur NEGATIVE NEGATIVE    Comment:        THE SENSITIVITY OF  THIS METHODOLOGY IS >24 mIU/mL   CBC with Differential     Status: Abnormal   Collection Time: 05/01/15 11:47 PM  Result Value Ref Range   WBC 8.2 4.0 - 10.5 K/uL   RBC 4.86 3.87 - 5.11 MIL/uL   Hemoglobin 12.9 12.0 - 15.0 g/dL   HCT 39.5 36.0 - 46.0 %   MCV 81.3 78.0 - 100.0 fL   MCH 26.5 26.0 - 34.0 pg   MCHC 32.7 30.0 - 36.0 g/dL   RDW 15.3 11.5 - 15.5 %   Platelets 565 (H) 150 - 400 K/uL   Neutrophils Relative % 49 %   Neutro Abs 4.0 1.7 - 7.7 K/uL   Lymphocytes Relative 28 %   Lymphs Abs 2.3 0.7 - 4.0 K/uL   Monocytes Relative 9 %   Monocytes Absolute 0.7 0.1 - 1.0 K/uL   Eosinophils Relative 13 %   Eosinophils Absolute 1.1 (H) 0.0 - 0.7 K/uL   Basophils Relative 1 %   Basophils Absolute 0.1 0.0 - 0.1 K/uL  Comprehensive metabolic panel     Status: Abnormal   Collection Time: 05/01/15 11:47 PM  Result Value Ref Range   Sodium 139 135 - 145 mmol/L   Potassium 3.9 3.5 - 5.1 mmol/L   Chloride 101 101 - 111 mmol/L   CO2 28 22 - 32 mmol/L   Glucose, Bld 110 (H) 65 - 99 mg/dL   BUN 14 6 - 20 mg/dL   Creatinine, Ser 1.24 (H) 0.44 - 1.00 mg/dL   Calcium 9.6 8.9 - 10.3 mg/dL   Total Protein 8.6 (H) 6.5 - 8.1 g/dL   Albumin 2.9 (L) 3.5 - 5.0 g/dL   AST 32 15 - 41 U/L   ALT 34 14 - 54 U/L   Alkaline Phosphatase 106 38 - 126 U/L   Total Bilirubin 0.2 (L) 0.3 - 1.2 mg/dL   GFR calc non Af Amer 52 (L) >60 mL/min   GFR calc Af Amer >60 >60 mL/min    Comment: (NOTE) The eGFR has been calculated using the CKD EPI equation. This calculation has not been validated in all clinical situations. eGFR's persistently <60 mL/min signify possible Chronic Kidney Disease.    Anion gap 10 5 - 15  Lipase, blood     Status: None   Collection Time: 05/01/15 11:47 PM  Result Value Ref Range   Lipase 47 11 - 51 U/L  Urinalysis, Routine w reflex microscopic (not at [facility])     Status: Abnormal   Collection Time: 05/01/15 11:53 PM  Result Value Ref Range   Color, Urine YELLOW YELLOW  APPearance TURBID (A) CLEAR   Specific Gravity, Urine 1.019 1.005 - 1.030   pH 5.5 5.0 - 8.0   Glucose, UA NEGATIVE NEGATIVE mg/dL   Hgb urine dipstick NEGATIVE NEGATIVE   Bilirubin Urine NEGATIVE NEGATIVE   Ketones, ur NEGATIVE NEGATIVE mg/dL   Protein, ur 100 (A) NEGATIVE mg/dL   Nitrite NEGATIVE NEGATIVE   Leukocytes, UA SMALL (A) NEGATIVE  Urine microscopic-add on     Status: Abnormal   Collection Time: 05/01/15 11:53 PM  Result Value Ref Range   Squamous Epithelial / LPF 6-30 (A) NONE SEEN   WBC, UA 6-30 0 - 5 WBC/hpf   RBC / HPF 0-5 0 - 5 RBC/hpf   Bacteria, UA MANY (A) NONE SEEN   Casts HYALINE CASTS (A) NEGATIVE  Protime-INR     Status: Abnormal   Collection Time: 05/02/15  3:12 AM  Result Value Ref Range   Prothrombin Time 22.8 (H) 11.6 - 15.2 seconds   INR 2.02 (H) 0.00 - 1.49   Ct Abdomen Pelvis W Contrast  05/02/2015  CLINICAL DATA:  Right-sided abdominal pain following recent cholecystectomy EXAM: CT ABDOMEN AND PELVIS WITH CONTRAST TECHNIQUE: Multidetector CT imaging of the abdomen and pelvis was performed using the standard protocol following bolus administration of intravenous contrast. CONTRAST:  182m OMNIPAQUE IOHEXOL 300 MG/ML  SOLN COMPARISON:  04/16/2015 FINDINGS: Lung bases demonstrate minimal bibasilar atelectasis. No focal infiltrate or sizable effusion is seen. Changes consistent with prior cholecystectomy are noted. An air-fluid collection is noted in the surgical bed which may simply be postoperative in nature although the possibility of a evolving abscess could not be totally excluded. The liver, spleen, adrenal glands and pancreas are otherwise within normal limits. Kidneys are well visualized bilaterally with a normal enhancement pattern. Postsurgical changes are noted within the stomach. The bladder is within normal limits. An IUD is noted within the pelvis. The appendix is not well seen although no inflammatory changes are noted. The bony structures are  within normal limits. Soft tissue changes are noted in the anterior abdominal wall likely related to the recent cholecystectomy. IMPRESSION: Status post cholecystectomy. An air-fluid collection is noted in the gallbladder fossa. This may simply represent a postoperative fluid collection although the possibility of evolving abscess would deserve consideration. Electronically Signed   By: MInez CatalinaM.D.   On: 05/02/2015 07:31    Review of Systems  Constitutional: Negative for fever, chills, weight loss, malaise/fatigue and diaphoresis.  HENT: Negative for congestion, ear discharge, ear pain, hearing loss, nosebleeds, sore throat and tinnitus.   Eyes: Negative.   Respiratory: Positive for cough and shortness of breath (DOE). Negative for hemoptysis, sputum production, wheezing and stridor.        Wakes up SOB at times She has CPAP but has not used since before her last admission.  Cardiovascular: Positive for chest pain (pain is more upper right chest below her GB site) and orthopnea. Negative for palpitations, claudication, leg swelling and PND.  Gastrointestinal: Positive for heartburn, abdominal pain (ongoing abdominal pain RUQ going to the back now.) and constipation (last BM yesterday). Negative for nausea, vomiting and diarrhea.  Genitourinary: Negative.   Musculoskeletal: Positive for joint pain (knee pain with walking).  Skin: Negative.   Neurological: Positive for headaches (Hx of migraines last one 2 days ago). Negative for weakness.  Endo/Heme/Allergies: Negative for environmental allergies and polydipsia. Bruises/bleeds easily (on coumadin and lovenox).  Psychiatric/Behavioral: Positive for depression. Negative for suicidal ideas, hallucinations, memory loss and substance abuse. The  patient is nervous/anxious. The patient does not have insomnia.     Blood pressure 138/97, pulse 86, temperature 97.9 F (36.6 C), temperature source Oral, resp. rate 18, SpO2 96 %. Physical Exam   Constitutional: She is oriented to person, place, and time. She appears well-developed and well-nourished. No distress.  Pt is lying on gurney with constant crying type of moan. She reports she just got dilaudid  HENT:  Head: Normocephalic and atraumatic.  Nose: Nose normal.  Mouth/Throat: No oropharyngeal exudate.  Eyes: Conjunctivae and EOM are normal. Right eye exhibits no discharge. Left eye exhibits no discharge. No scleral icterus.  Neck: Normal range of motion. Neck supple. No JVD present. No tracheal deviation present. No thyromegaly present.  Cardiovascular: Normal rate, regular rhythm, normal heart sounds and intact distal pulses.   No murmur heard. Respiratory: Effort normal and breath sounds normal. No respiratory distress. She has no wheezes. She has no rales. She exhibits no tenderness.  GI: Soft. Bowel sounds are normal. She exhibits no distension. There is tenderness (she complains of constant pain RUQ, points to lower right port as site most tender.  Pain with sitting up.  Pain goes to her back.  Nothing makes it better or worse.). There is no rebound and no guarding.  All the port sites look fine are well healed, I took off all the steri strips in the ED.  Musculoskeletal: She exhibits no edema.  Lymphadenopathy:    She has no cervical adenopathy.  Neurological: She is alert and oriented to person, place, and time. No cranial nerve deficit.  Skin: Skin is warm and dry. No rash noted. She is not diaphoretic. No erythema. No pallor.  Psychiatric: Her behavior is normal. Judgment and thought content normal.  Anxious, ongoing cry type moan, worse with sitting up.     Assessment/Plan Post op cholecystectomy with ongoing RUQ pain now going to her back. 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  Hold coumadin and Lovenox, start her on heparin per pharmacy.  Clears for now.  HIDA tomorrow.  I will put her back on amlodipine for now, hold HCTZ.  We may need Medicine later to help with BP and renal issues.   Meiling Hendriks 05/02/2015, 7:50 AM

## 2015-05-02 NOTE — Progress Notes (Signed)
ANTICOAGULATION CONSULT NOTE - Follow Up Consult  Pharmacy Consult for heparin Indication: pulmonary embolus and DVT  Labs:  Recent Labs  05/01/15 2347 05/02/15 0312 05/02/15 1842  HGB 12.9  --   --   HCT 39.5  --   --   PLT 565*  --   --   LABPROT  --  22.8* 23.3*  INR  --  2.02* 2.08*  CREATININE 1.24*  --   --     Estimated Creatinine Clearance: 92.5 mL/min (by C-G formula based on Cr of 1.24).  Assessment: 28 yof presented to the ED with abdominal pain s/p cholecystectomy on 12/3. She is on chronic Coumadin for history of PE and DVT and was on Lovenox bridge also. Last dose of Coumadin was 12/16. Repeat INR remains >2 and is slightly higher than this morning. INR may be trending up so will check another in the morning.   Goal of Therapy:  Heparin level 0.3-0.7 units/ml Monitor platelets by anticoagulation protocol: Yes   Plan:  - Heparin when INR <2 - F/u INR in am - F/u surgery plans  [facility], [place].D., BCPS Clinical Pharmacist Pager: [phone] 05/02/2015 7:22 PM

## 2015-05-03 LAB — CBC
HEMATOCRIT: 36.7 % (ref 36.0–46.0)
Hemoglobin: 12.1 g/dL (ref 12.0–15.0)
MCH: 27.1 pg (ref 26.0–34.0)
MCHC: 33 g/dL (ref 30.0–36.0)
MCV: 82.1 fL (ref 78.0–100.0)
PLATELETS: 443 10*3/uL — AB (ref 150–400)
RBC: 4.47 MIL/uL (ref 3.87–5.11)
RDW: 15.6 % — ABNORMAL HIGH (ref 11.5–15.5)
WBC: 6.7 10*3/uL (ref 4.0–10.5)

## 2015-05-03 LAB — COMPREHENSIVE METABOLIC PANEL
ALT: 37 U/L (ref 14–54)
AST: 39 U/L (ref 15–41)
Albumin: 2.4 g/dL — ABNORMAL LOW (ref 3.5–5.0)
Alkaline Phosphatase: 100 U/L (ref 38–126)
Anion gap: 8 (ref 5–15)
BUN: 6 mg/dL (ref 6–20)
CHLORIDE: 102 mmol/L (ref 101–111)
CO2: 27 mmol/L (ref 22–32)
Calcium: 8.9 mg/dL (ref 8.9–10.3)
Creatinine, Ser: 1.03 mg/dL — ABNORMAL HIGH (ref 0.44–1.00)
Glucose, Bld: 88 mg/dL (ref 65–99)
POTASSIUM: 4.2 mmol/L (ref 3.5–5.1)
Sodium: 137 mmol/L (ref 135–145)
Total Bilirubin: 0.5 mg/dL (ref 0.3–1.2)
Total Protein: 7.5 g/dL (ref 6.5–8.1)

## 2015-05-03 LAB — PROTIME-INR
INR: 2.12 — AB (ref 0.00–1.49)
Prothrombin Time: 23.6 seconds — ABNORMAL HIGH (ref 11.6–15.2)

## 2015-05-03 MED ORDER — WARFARIN - PHARMACIST DOSING INPATIENT: Freq: Every day | Status: DC

## 2015-05-03 MED ORDER — MAGNESIUM HYDROXIDE 400 MG/5ML PO SUSP
30.0000 mL | Freq: Every day | ORAL | Status: DC | PRN
  Administered 2015-05-03: 30 mL via ORAL
  Filled 2015-05-03: qty 30

## 2015-05-03 MED ORDER — WARFARIN SODIUM 5 MG PO TABS
12.5000 mg | ORAL_TABLET | Freq: Once | ORAL | Status: AC
  Administered 2015-05-03: 12.5 mg via ORAL
  Filled 2015-05-03: qty 3

## 2015-05-03 NOTE — Progress Notes (Signed)
Utilization Review Completed.Alice [name] T12/18/2016  

## 2015-05-03 NOTE — Progress Notes (Addendum)
[place] for warfarin Indication: pulmonary embolus and DVT  Allergies  Allergen Reactions  . Oxycodone Hcl Nausea Only    Patient Measurements: Height: 5\' 8"  (172.7 cm) Weight: (!) 341 lb (154.677 kg) IBW/kg (Calculated) : 63.9  Vital Signs: Temp: 97.8 F (36.6 C) (12/18 0640) Temp Source: Oral (12/18 0640) BP: 112/64 mmHg (12/18 0640) Pulse Rate: 97 (12/18 0640)  Labs:  Recent Labs  05/01/15 2347 05/02/15 0312 05/02/15 1842 05/03/15 0330  HGB 12.9  --   --  12.1  HCT 39.5  --   --  36.7  PLT 565*  --   --  443*  LABPROT  --  22.8* 23.3* 23.6*  INR  --  2.02* 2.08* 2.12*  CREATININE 1.24*  --   --  1.03*    Estimated Creatinine Clearance: 111.4 mL/min (by C-G formula based on Cr of 1.03).  Assessment: 5 yof presented to the ED with abdominal pain s/p cholecystectomy on 12/3. She is on chronic coumadin for history of PE and DVT. She was being bridged with lovenox until INR is therapeutic. Lovenox and coumadin were held for GI work-up. INR never dropped <2 and today it remains therapeutic at 2.12. HIDA scan is negative so coumadin to resume today.   Goal of Therapy:  INR 2-3 Monitor platelets by anticoagulation protocol: Yes   Plan:  - Coumadin 12.5mg  PO x 1 now  - Daily INR  Salome Arnt, PharmD, BCPS Pager # [phone] 05/03/2015 11:53 AM

## 2015-05-03 NOTE — Progress Notes (Signed)
Patient ID: Alice [name], female   DOB: 1970/07/10, 44 y.o.   MRN: [id number] [facility] Surgery Progress Note:   * No surgery found *  Subjective: Mental status is alert Objective: Vital signs in last 24 hours: Temp:  [97.8 F (36.6 C)-98.4 F (36.9 C)] 97.8 F (36.6 C) (12/18 0640) Pulse Rate:  [79-98] 97 (12/18 0640) Resp:  [20] 20 (12/18 0640) BP: (112-138)/(64-91) 112/64 mmHg (12/18 0640) SpO2:  [96 %-100 %] 96 % (12/18 0640) Weight:  [154.677 kg (341 lb)] 154.677 kg (341 lb) (12/17 1240)  Intake/Output from previous day: 12/17 0701 - 12/18 0700 In: 558.3 [P.O.:360; I.V.:198.3] Out: 650 [Urine:650] Intake/Output this shift:    Physical Exam: Work of breathing is normal.  A little hungry.    Lab Results:  Results for orders placed or performed during the hospital encounter of 05/02/15 (from the past 48 hour(s))  POC Urine Pregnancy, ED (do NOT order at [facility])     Status: None   Collection Time: 05/01/15 11:44 PM  Result Value Ref Range   Preg Test, Ur NEGATIVE NEGATIVE    Comment:        THE SENSITIVITY OF THIS METHODOLOGY IS >24 mIU/mL   CBC with Differential     Status: Abnormal   Collection Time: 05/01/15 11:47 PM  Result Value Ref Range   WBC 8.2 4.0 - 10.5 K/uL   RBC 4.86 3.87 - 5.11 MIL/uL   Hemoglobin 12.9 12.0 - 15.0 g/dL   HCT 39.5 36.0 - 46.0 %   MCV 81.3 78.0 - 100.0 fL   MCH 26.5 26.0 - 34.0 pg   MCHC 32.7 30.0 - 36.0 g/dL   RDW 15.3 11.5 - 15.5 %   Platelets 565 (H) 150 - 400 K/uL   Neutrophils Relative % 49 %   Neutro Abs 4.0 1.7 - 7.7 K/uL   Lymphocytes Relative 28 %   Lymphs Abs 2.3 0.7 - 4.0 K/uL   Monocytes Relative 9 %   Monocytes Absolute 0.7 0.1 - 1.0 K/uL   Eosinophils Relative 13 %   Eosinophils Absolute 1.1 (H) 0.0 - 0.7 K/uL   Basophils Relative 1 %   Basophils Absolute 0.1 0.0 - 0.1 K/uL  Comprehensive metabolic panel     Status: Abnormal   Collection Time: 05/01/15 11:47 PM  Result Value Ref Range   Sodium 139 135 - 145  mmol/L   Potassium 3.9 3.5 - 5.1 mmol/L   Chloride 101 101 - 111 mmol/L   CO2 28 22 - 32 mmol/L   Glucose, Bld 110 (H) 65 - 99 mg/dL   BUN 14 6 - 20 mg/dL   Creatinine, Ser 1.24 (H) 0.44 - 1.00 mg/dL   Calcium 9.6 8.9 - 10.3 mg/dL   Total Protein 8.6 (H) 6.5 - 8.1 g/dL   Albumin 2.9 (L) 3.5 - 5.0 g/dL   AST 32 15 - 41 U/L   ALT 34 14 - 54 U/L   Alkaline Phosphatase 106 38 - 126 U/L   Total Bilirubin 0.2 (L) 0.3 - 1.2 mg/dL   GFR calc non Af Amer 52 (L) >60 mL/min   GFR calc Af Amer >60 >60 mL/min    Comment: (NOTE) The eGFR has been calculated using the CKD EPI equation. This calculation has not been validated in all clinical situations. eGFR's persistently <60 mL/min signify possible Chronic Kidney Disease.    Anion gap 10 5 - 15  Lipase, blood     Status: None   Collection  Time: 05/01/15 11:47 PM  Result Value Ref Range   Lipase 47 11 - 51 U/L  Urinalysis, Routine w reflex microscopic (not at [facility])     Status: Abnormal   Collection Time: 05/01/15 11:53 PM  Result Value Ref Range   Color, Urine YELLOW YELLOW   APPearance TURBID (A) CLEAR   Specific Gravity, Urine 1.019 1.005 - 1.030   pH 5.5 5.0 - 8.0   Glucose, UA NEGATIVE NEGATIVE mg/dL   Hgb urine dipstick NEGATIVE NEGATIVE   Bilirubin Urine NEGATIVE NEGATIVE   Ketones, ur NEGATIVE NEGATIVE mg/dL   Protein, ur 100 (A) NEGATIVE mg/dL   Nitrite NEGATIVE NEGATIVE   Leukocytes, UA SMALL (A) NEGATIVE  Urine microscopic-add on     Status: Abnormal   Collection Time: 05/01/15 11:53 PM  Result Value Ref Range   Squamous Epithelial / LPF 6-30 (A) NONE SEEN   WBC, UA 6-30 0 - 5 WBC/hpf   RBC / HPF 0-5 0 - 5 RBC/hpf   Bacteria, UA MANY (A) NONE SEEN   Casts HYALINE CASTS (A) NEGATIVE  Protime-INR     Status: Abnormal   Collection Time: 05/02/15  3:12 AM  Result Value Ref Range   Prothrombin Time 22.8 (H) 11.6 - 15.2 seconds   INR 2.02 (H) 0.00 - 1.49  Protime-INR     Status: Abnormal   Collection Time: 05/02/15  6:42  PM  Result Value Ref Range   Prothrombin Time 23.3 (H) 11.6 - 15.2 seconds   INR 2.08 (H) 0.00 - 1.49  Comprehensive metabolic panel     Status: Abnormal   Collection Time: 05/03/15  3:30 AM  Result Value Ref Range   Sodium 137 135 - 145 mmol/L   Potassium 4.2 3.5 - 5.1 mmol/L   Chloride 102 101 - 111 mmol/L   CO2 27 22 - 32 mmol/L   Glucose, Bld 88 65 - 99 mg/dL   BUN 6 6 - 20 mg/dL   Creatinine, Ser 1.03 (H) 0.44 - 1.00 mg/dL   Calcium 8.9 8.9 - 10.3 mg/dL   Total Protein 7.5 6.5 - 8.1 g/dL   Albumin 2.4 (L) 3.5 - 5.0 g/dL   AST 39 15 - 41 U/L   ALT 37 14 - 54 U/L   Alkaline Phosphatase 100 38 - 126 U/L   Total Bilirubin 0.5 0.3 - 1.2 mg/dL   GFR calc non Af Amer >60 >60 mL/min   GFR calc Af Amer >60 >60 mL/min    Comment: (NOTE) The eGFR has been calculated using the CKD EPI equation. This calculation has not been validated in all clinical situations. eGFR's persistently <60 mL/min signify possible Chronic Kidney Disease.    Anion gap 8 5 - 15  CBC     Status: Abnormal   Collection Time: 05/03/15  3:30 AM  Result Value Ref Range   WBC 6.7 4.0 - 10.5 K/uL   RBC 4.47 3.87 - 5.11 MIL/uL   Hemoglobin 12.1 12.0 - 15.0 g/dL   HCT 36.7 36.0 - 46.0 %   MCV 82.1 78.0 - 100.0 fL   MCH 27.1 26.0 - 34.0 pg   MCHC 33.0 30.0 - 36.0 g/dL   RDW 15.6 (H) 11.5 - 15.5 %   Platelets 443 (H) 150 - 400 K/uL  Protime-INR     Status: Abnormal   Collection Time: 05/03/15  3:30 AM  Result Value Ref Range   Prothrombin Time 23.6 (H) 11.6 - 15.2 seconds   INR 2.12 (H) 0.00 -  1.49    Radiology/Results: Nm Hepatobiliary Including Gb  05/02/2015  CLINICAL DATA:  Abdominal pain since cholecystectomy on 04/18/2015 question bile leak EXAM: NUCLEAR MEDICINE HEPATOBILIARY IMAGING TECHNIQUE: Sequential images of the abdomen were obtained out to 60 minutes following intravenous administration of radiopharmaceutical. RADIOPHARMACEUTICALS:  5.0 mCi Tc-32mCholetec IV COMPARISON:  CT abdomen and pelvis  05/02/2015 FINDINGS: Prompt tracer clearance from bloodstream indicating normal hepatocellular function. Prompt excretion of tracer into biliary tree. Small bowel visualized by 13 minutes. No focal hepatic retention of tracer. Imaging through 2 hours demonstrates no abnormal perihepatic/subhepatic collection of tracer to suggest bile leak. Specifically, no tracer is seen accumulating within the gallbladder fossa fluid collection as noted on prior CT. IMPRESSION: Patent biliary tree. No evidence of bile leak. Electronically Signed   By: MLavonia DanaM.D.   On: 05/02/2015 11:39   Ct Abdomen Pelvis W Contrast  05/02/2015  CLINICAL DATA:  Right-sided abdominal pain following recent cholecystectomy EXAM: CT ABDOMEN AND PELVIS WITH CONTRAST TECHNIQUE: Multidetector CT imaging of the abdomen and pelvis was performed using the standard protocol following bolus administration of intravenous contrast. CONTRAST:  1064mOMNIPAQUE IOHEXOL 300 MG/ML  SOLN COMPARISON:  04/16/2015 FINDINGS: Lung bases demonstrate minimal bibasilar atelectasis. No focal infiltrate or sizable effusion is seen. Changes consistent with prior cholecystectomy are noted. An air-fluid collection is noted in the surgical bed which may simply be postoperative in nature although the possibility of a evolving abscess could not be totally excluded. The liver, spleen, adrenal glands and pancreas are otherwise within normal limits. Kidneys are well visualized bilaterally with a normal enhancement pattern. Postsurgical changes are noted within the stomach. The bladder is within normal limits. An IUD is noted within the pelvis. The appendix is not well seen although no inflammatory changes are noted. The bony structures are within normal limits. Soft tissue changes are noted in the anterior abdominal wall likely related to the recent cholecystectomy. IMPRESSION: Status post cholecystectomy. An air-fluid collection is noted in the gallbladder fossa. This may  simply represent a postoperative fluid collection although the possibility of evolving abscess would deserve consideration. Electronically Signed   By: MaInez Catalina.D.   On: 05/02/2015 07:31    Anti-infectives: Anti-infectives    Start     Dose/Rate Route Frequency Ordered Stop   05/02/15 0845  ciprofloxacin (CIPRO) IVPB 400 mg    Comments:  Start after her urine culture is obtained.   400 mg 200 mL/hr over 60 Minutes Intravenous Every 12 hours 05/02/15 0849        Assessment/Plan: Problem List: Patient Active Problem List   Diagnosis Date Noted  . Abdominal pain 05/02/2015  . Symptomatic cholelithiasis 04/16/2015  . Acute bilateral upper abdominal pain 04/16/2015  . Anemia 05/18/2013  . Hypotension, unspecified 05/17/2013  . CKD (chronic kidney disease), stage III 05/17/2013  . Anal fissure 02/06/2013  . Anal skin tag 02/06/2013  . ALLERGIC RHINITIS 03/05/2010  . LOW BACK PAIN SYNDROME 12/13/2007  . WOUND INFECTION 10/10/2007  . OBESITY, MORBID 12/02/2006  . HTN (hypertension) 12/02/2006  . History of pulmonary embolism 12/02/2006  . History of DVT (deep vein thrombosis) 12/02/2006  . 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 GASTROESOPHAGEAL REFLUX DISEASE 12/02/2006  . OSA (obstructive sleep apnea) 12/02/2006    HIDA showed no leak.  Two weeks out from lap chole by [facility]ros  Will continue Cipro and observation for now treating possible infection in GB fossa.  Hopeful improvement and discharge tomorrow * No  surgery found *    LOS: 1 day   Matt B. Hassell Done, MD, [facility] [phone] beeper [phone]  05/03/2015 10:30 AM

## 2015-05-04 LAB — PROTIME-INR
INR: 2.23 — AB (ref 0.00–1.49)
Prothrombin Time: 24.5 seconds — ABNORMAL HIGH (ref 11.6–15.2)

## 2015-05-04 LAB — CBC
HEMATOCRIT: 36.4 % (ref 36.0–46.0)
Hemoglobin: 12.1 g/dL (ref 12.0–15.0)
MCH: 27.1 pg (ref 26.0–34.0)
MCHC: 33.2 g/dL (ref 30.0–36.0)
MCV: 81.6 fL (ref 78.0–100.0)
Platelets: 442 10*3/uL — ABNORMAL HIGH (ref 150–400)
RBC: 4.46 MIL/uL (ref 3.87–5.11)
RDW: 15.6 % — AB (ref 11.5–15.5)
WBC: 6.6 10*3/uL (ref 4.0–10.5)

## 2015-05-04 MED ORDER — CIPROFLOXACIN HCL 500 MG PO TABS: 500.0000 mg | ORAL_TABLET | Freq: Two times a day (BID) | ORAL | Status: DC

## 2015-05-04 MED ORDER — HYDROCODONE-ACETAMINOPHEN 7.5-325 MG PO TABS: 1.0000 | ORAL_TABLET | Freq: Four times a day (QID) | ORAL | Status: DC | PRN

## 2015-05-04 NOTE — Progress Notes (Signed)
Discharge paperwork given to patient. No questions verbalized. IV removed and prescriptions given to patient.

## 2015-05-04 NOTE — Discharge Instructions (Signed)
LAPAROSCOPIC SURGERY: POST OP INSTRUCTIONS  1. DIET: Follow a light bland diet the first 24 hours after arrival home, such as soup, liquids, crackers, etc. Be sure to include lots of fluids daily. Avoid fast food or heavy meals as your are more likely to get nauseated. Eat a low fat the next few days after surgery.  2. Take your usually prescribed home medications unless otherwise directed. 3. PAIN CONTROL:  1. Pain is best controlled by a usual combination of three different methods TOGETHER:  1. Ice/Heat 2. Over the counter pain medication 3. Prescription pain medication 2. Most patients will experience some swelling and bruising around the incisions. Ice packs or heating pads (30-60 minutes up to 6 times a day) will help. Use ice for the first few days to help decrease swelling and bruising, then switch to heat to help relax tight/sore spots and speed recovery. Some people prefer to use ice alone, heat alone, alternating between ice & heat. Experiment to what works for you. Swelling and bruising can take several weeks to resolve.  3. It is helpful to take an over-the-counter pain medication regularly for the first few weeks. Choose one of the following that works best for you:  1. Naproxen (Aleve, etc) Two 220mg  tabs twice a day 2. Ibuprofen (Advil, etc) Three 200mg  tabs four times a day (every meal & bedtime) 3. Acetaminophen (Tylenol, etc) 500-650mg  four times a day (every meal & bedtime) 4. A prescription for pain medication (such as oxycodone, hydrocodone, etc) should be given to you upon discharge. Take your pain medication as prescribed.  1. If you are having problems/concerns with the prescription medicine (does not control pain, nausea, vomiting, rash, itching, etc), please call us [phone] to see if we need to switch you to a different pain medicine that will work better for you and/or control your side effect better. 2. If you need a refill on your pain medication, please  contact your pharmacy. They will contact our office to request authorization. Prescriptions will not be filled after 5 pm or on week-ends. 4. Avoid getting constipated. Between the surgery and the pain medications, it is common to experience some constipation. Increasing fluid intake and taking a fiber supplement (such as Metamucil, Citrucel, FiberCon, MiraLax, etc) 1-2 times a day regularly will usually help prevent this problem from occurring. A mild laxative (prune juice, Milk of Magnesia, MiraLax, etc) should be taken according to package directions if there are no bowel movements after 48 hours.  5. Watch out for diarrhea. If you have many loose bowel movements, simplify your diet to bland foods & liquids for a few days. Stop any stool softeners and decrease your fiber supplement. Switching to mild anti-diarrheal medications (Kayopectate, Pepto Bismol) can help. If this worsens or does not improve, please call us. 6. Wash / shower every day. You may shower over the dressings as they are waterproof. Continue to shower over incision(s) after the dressing is off. 7. Remove your waterproof bandages 5 days after surgery. You may leave the incision open to air. You may replace a dressing/Band-Aid to cover the incision for comfort if you wish.  8. ACTIVITIES as tolerated:  1. You may resume regular (light) daily activities beginning the next day--such as daily self-care, walking, climbing stairs--gradually increasing activities as tolerated. If you can walk 30 minutes without difficulty, it is safe to try more intense activity such as jogging, treadmill, bicycling, low-impact aerobics, swimming, etc. 2. Save the most intensive and strenuous activity  for last such as sit-ups, heavy lifting, contact sports, etc Refrain from any heavy lifting or straining until you are off narcotics for pain control.  3. DO NOT PUSH THROUGH PAIN. Let pain be your guide: If it hurts to do something, don't do it. Pain is your body  warning you to avoid that activity for another week until the pain goes down. 4. You may drive when you are no longer taking prescription pain medication, you can comfortably wear a seatbelt, and you can safely maneuver your car and apply brakes. 5. You may have sexual intercourse when it is comfortable.  9. FOLLOW UP in our office  1. Please call [facility] at [phone] to set up an appointment to see your surgeon in the office for a follow-up appointment approximately 2-3 weeks after your surgery. 2. Make sure that you call for this appointment the day you arrive home to insure a convenient appointment time.      10. IF YOU HAVE DISABILITY OR FAMILY LEAVE FORMS, BRING THEM TO THE               OFFICE FOR PROCESSING.   WHEN TO CALL us [phone]:  1. Poor pain control 2. Reactions / problems with new medications (rash/itching, nausea, etc)  3. Fever over 101.5 F (38.5 C) 4. Inability to urinate 5. Nausea and/or vomiting 6. Worsening swelling or bruising 7. Continued bleeding from incision. 8. Increased pain, redness, or drainage from the incision  The clinic staff is available to answer your questions during regular business hours (8:30am-5pm). Please dont hesitate to call and ask to speak to one of our nurses for clinical concerns.  If you have a medical emergency, go to the nearest emergency room or call 911.  A surgeon from [facility] is always on call at the [facility], [place], [place], [place], [place] [address] ?  MAIN: [phone] ? TOLL FREE: [phone] ?  FAX [phone]  [email or web address]   Constipation, Adult Constipation is when a person has fewer than three bowel movements a week, has difficulty having a bowel movement, or has stools that are dry, hard, or larger than normal. As people grow older, constipation is more common. A low-fiber diet, not taking in enough fluids, and taking certain  medicines may make constipation worse.  CAUSES   Certain medicines, such as antidepressants, pain medicine, iron supplements, antacids, and water pills.   Certain diseases, such as diabetes, irritable bowel syndrome (IBS), thyroid disease, or depression.   Not drinking enough water.   Not eating enough fiber-rich foods.   Stress or travel.   Lack of physical activity or exercise.   Ignoring the urge to have a bowel movement.   Using laxatives too much.  SIGNS AND SYMPTOMS   Having fewer than three bowel movements a week.   Straining to have a bowel movement.   Having stools that are hard, dry, or larger than normal.   Feeling full or bloated.   Pain in the lower abdomen.   Not feeling relief after having a bowel movement.  DIAGNOSIS  Your health care provider will take a medical history and perform a physical exam. Further testing may be done for severe constipation. Some tests may include:  A barium enema X-ray to examine your rectum, colon, and, sometimes, your small intestine.   A sigmoidoscopy to examine your lower colon.   A colonoscopy to examine your entire colon.  TREATMENT  Treatment will depend on the severity of your constipation and what is causing it. Some dietary treatments include drinking more fluids and eating more fiber-rich foods. Lifestyle treatments may include regular exercise. If these diet and lifestyle recommendations do not help, your health care provider may recommend taking over-the-counter laxative medicines to help you have bowel movements. Prescription medicines may be prescribed if over-the-counter medicines do not work.  HOME CARE INSTRUCTIONS   Eat foods that have a lot of fiber, such as fruits, vegetables, whole grains, and beans.  Limit foods high in fat and processed sugars, such as french fries, hamburgers, cookies, candies, and soda.   A fiber supplement may be added to your diet if you cannot get enough fiber from  foods.   Drink enough fluids to keep your urine clear or pale yellow.   Exercise regularly or as directed by your health care provider.   Go to the restroom when you have the urge to go. Do not hold it.   Only take over-the-counter or prescription medicines as directed by your health care provider. Do not take other medicines for constipation without talking to your health care provider first.  [place] IF:   You have bright red blood in your stool.   Your constipation lasts for more than 4 days or gets worse.   You have abdominal or rectal pain.   You have thin, pencil-like stools.   You have unexplained weight loss. MAKE SURE YOU:   Understand these instructions.  Will watch your condition.  Will get help right away if you are not doing well or get worse.   This information is not intended to replace advice given to you by your health care provider. Make sure you discuss any questions you have with your health care provider.   Document Released: 01/29/2004 Document Revised: 05/23/2014 Document Reviewed: 02/11/2013 Elsevier Interactive Patient Education [employer].

## 2015-05-04 NOTE — Discharge Summary (Signed)
[facility] Discharge Summary   Patient ID: Alice [name] MRN: [id number] DOB/AGE: 1970/06/25 44 y.o.  Admit date: 05/02/2015 Discharge date: 05/04/2015  Admitting Diagnosis: Abdominal pain Fluid collection in the gallbladder wall fossa  Discharge Diagnosis Patient Active Problem List   Diagnosis Date Noted  . Abdominal pain 05/02/2015  . Symptomatic cholelithiasis 04/16/2015  . Acute bilateral upper abdominal pain 04/16/2015  . Anemia 05/18/2013  . Hypotension, unspecified 05/17/2013  . CKD (chronic kidney disease), stage III 05/17/2013  . Anal fissure 02/06/2013  . Anal skin tag 02/06/2013  . ALLERGIC RHINITIS 03/05/2010  . LOW BACK PAIN SYNDROME 12/13/2007  . WOUND INFECTION 10/10/2007  . OBESITY, MORBID 12/02/2006  . HTN (hypertension) 12/02/2006  . History of pulmonary embolism 12/02/2006  . History of DVT (deep vein thrombosis) 12/02/2006  . 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 GASTROESOPHAGEAL REFLUX DISEASE 12/02/2006  . OSA (obstructive sleep apnea) 12/02/2006    Consultants None  Imaging: Nm Hepatobiliary Including Gb  05/02/2015  CLINICAL DATA:  Abdominal pain since cholecystectomy on 04/18/2015 question bile leak EXAM: NUCLEAR MEDICINE HEPATOBILIARY IMAGING TECHNIQUE: Sequential images of the abdomen were obtained out to 60 minutes following intravenous administration of radiopharmaceutical. RADIOPHARMACEUTICALS:  5.0 mCi Tc-41m Choletec IV COMPARISON:  CT abdomen and pelvis 05/02/2015 FINDINGS: Prompt tracer clearance from bloodstream indicating normal hepatocellular function. Prompt excretion of tracer into biliary tree. Small bowel visualized by 13 minutes. No focal hepatic retention of tracer. Imaging through 2 hours demonstrates no abnormal perihepatic/subhepatic collection of tracer to suggest bile leak. Specifically, no tracer is seen accumulating within the gallbladder fossa fluid collection as noted on prior CT.  IMPRESSION: Patent biliary tree. No evidence of bile leak. Electronically Signed   By: Lavonia Dana M.D.   On: 05/02/2015 11:39    Procedures None this admission   Hospital Course:  Pt hospitalized from 12/1-12/6/16 with acute cholecystitis. She has cholecystectomy on 04/16/15. Post op she had ongoing nausea, perhaps some ileus, and had a slow recovery. Now in ED with pain she says its been hurting since she went home. Pain is in RUQ and now goes to her back. No fever, some sweats. She has been able to eat, no nausea or vomiting with PO, but not eating like she would normally. Last BM yesterday, but had some constipation. PCP increased her pain meds on her visit to him. Nothing make pain better or worse.   Work up in the ED shows she is afebrile, BP is up, labs show mild ongoing renal insuffiencey, with a normal WBC. INR is 2.02. UA shows a possible UTI. CT scan shows some air fluid collection in the GB bed which they cannot tell if this is normal post op changes or early evolving abscess.   Patient was admitted and was transferred to the floor for further workup.  HIDA was obtained and found to be negative for leak.  LFT's, lipase and CBC are normal.  Creatinine and BP have improved.  Diet was advanced as tolerated.  She was resumed on coumadin.  She was kept on cipro for possible GB wall fossa infection and possible UTI.  She will continue this for a total of 7 days.  On HD #3, the patient was voiding well, tolerating diet, ambulating well, pain well controlled, vital signs stable, incisions healed and felt stable for discharge home.  Patient will follow up in our office for a post-op check on Wednesday and knows to call with questions or concerns.  Because she will be on cipro at discharge he INR will have to be monitored closely.  She will follow up with her PCP for INR checks.   Physical Exam: General:  Alert, NAD, pleasant, comfortable Abd:  Soft, ND, minimal tenderness, incisions  well healed    Medication List    STOP taking these medications        enoxaparin 100 MG/ML injection  Commonly known as:  LOVENOX     metoCLOPramide 10 MG tablet  Commonly known as:  REGLAN     metroNIDAZOLE 500 MG tablet  Commonly known as:  FLAGYL     oxyCODONE-acetaminophen 5-325 MG tablet  Commonly known as:  PERCOCET/ROXICET      TAKE these medications        amLODipine 10 MG tablet  Commonly known as:  NORVASC  Take 1 tablet (10 mg total) by mouth daily.     ciprofloxacin 500 MG tablet  Commonly known as:  CIPRO  Take 1 tablet (500 mg total) by mouth 2 (two) times daily.     docusate sodium 100 MG capsule  Commonly known as:  COLACE  Take 1 capsule (100 mg total) by mouth 2 (two) times daily.     hydrochlorothiazide 25 MG tablet  Commonly known as:  HYDRODIURIL  Take 25 mg by mouth daily.     HYDROcodone-acetaminophen 7.5-325 MG tablet  Commonly known as:  NORCO  Take 1-2 tablets by mouth every 6 (six) hours as needed for moderate pain or severe pain.     pantoprazole 20 MG tablet  Commonly known as:  PROTONIX  Take 1 tablet (20 mg total) by mouth daily.     warfarin 2.5 MG tablet  Commonly known as:  COUMADIN  Take 5 tablets (12.5 mg total) by mouth one time only at 6 PM. Take daily until INR checked and then per PCP.         Follow-up Information    Follow up with OSEI-BONSU,GEORGE, MD. Schedule an appointment as soon as possible for a visit in 2 days.   Specialty:  Internal Medicine   Why:  For post-hospital follow up for your coumadin checks   Contact information:   [id number] [place] [address] [phone]       Go to [facility], [place].   Specialty:  General Surgery   Why:  For post-hospital follow up.  Call to verify appt date/time with [facility].   Contact information:   [address] [place] [phone]      Signed: Vaughan Basta [facility] [phone]  05/04/2015, 8:19 AM

## 2015-05-15 ENCOUNTER — Ambulatory Visit
Admission: RE | Admit: 2015-05-15 | Discharge: 2015-05-15 | Disposition: A | Discharge status: Home / Self Care | Payer: Medicare Other | Source: Ambulatory Visit

## 2015-05-15 DIAGNOSIS — Z1231 Encounter for screening mammogram for malignant neoplasm of breast: Secondary | ICD-10-CM | POA: Diagnosis not present

## 2015-05-20 DIAGNOSIS — R1013 Epigastric pain: Secondary | ICD-10-CM | POA: Diagnosis not present

## 2015-05-20 DIAGNOSIS — E559 Vitamin D deficiency, unspecified: Secondary | ICD-10-CM | POA: Diagnosis not present

## 2015-05-20 DIAGNOSIS — G8918 Other acute postprocedural pain: Secondary | ICD-10-CM | POA: Diagnosis not present

## 2015-05-20 DIAGNOSIS — F329 Major depressive disorder, single episode, unspecified: Secondary | ICD-10-CM | POA: Diagnosis not present

## 2015-05-20 DIAGNOSIS — Z5181 Encounter for therapeutic drug level monitoring: Secondary | ICD-10-CM | POA: Diagnosis not present

## 2015-05-20 DIAGNOSIS — G4733 Obstructive sleep apnea (adult) (pediatric): Secondary | ICD-10-CM | POA: Diagnosis not present

## 2015-05-20 DIAGNOSIS — I10 Essential (primary) hypertension: Secondary | ICD-10-CM | POA: Diagnosis not present

## 2015-07-13 ENCOUNTER — Encounter (HOSPITAL_COMMUNITY): Payer: Self-pay | Admitting: Emergency Medicine

## 2015-07-13 ENCOUNTER — Emergency Department (HOSPITAL_COMMUNITY)
Admission: EM | Admit: 2015-07-13 | Discharge: 2015-07-14 | Disposition: A | Discharge status: Home / Self Care | Payer: Medicare Other | Attending: Emergency Medicine | Admitting: Emergency Medicine

## 2015-07-13 DIAGNOSIS — Z7901 Long term (current) use of anticoagulants: Secondary | ICD-10-CM | POA: Insufficient documentation

## 2015-07-13 DIAGNOSIS — Z8701 Personal history of pneumonia (recurrent): Secondary | ICD-10-CM | POA: Insufficient documentation

## 2015-07-13 DIAGNOSIS — G8929 Other chronic pain: Secondary | ICD-10-CM | POA: Diagnosis not present

## 2015-07-13 DIAGNOSIS — Z862 Personal history of diseases of the blood and blood-forming organs and certain disorders involving the immune mechanism: Secondary | ICD-10-CM | POA: Diagnosis not present

## 2015-07-13 DIAGNOSIS — R112 Nausea with vomiting, unspecified: Secondary | ICD-10-CM | POA: Diagnosis not present

## 2015-07-13 DIAGNOSIS — E663 Overweight: Secondary | ICD-10-CM | POA: Insufficient documentation

## 2015-07-13 DIAGNOSIS — R1110 Vomiting, unspecified: Secondary | ICD-10-CM

## 2015-07-13 DIAGNOSIS — R109 Unspecified abdominal pain: Secondary | ICD-10-CM | POA: Diagnosis present

## 2015-07-13 DIAGNOSIS — E669 Obesity, unspecified: Secondary | ICD-10-CM | POA: Diagnosis not present

## 2015-07-13 DIAGNOSIS — G43909 Migraine, unspecified, not intractable, without status migrainosus: Secondary | ICD-10-CM | POA: Diagnosis not present

## 2015-07-13 DIAGNOSIS — K219 Gastro-esophageal reflux disease without esophagitis: Secondary | ICD-10-CM | POA: Insufficient documentation

## 2015-07-13 DIAGNOSIS — R1084 Generalized abdominal pain: Secondary | ICD-10-CM | POA: Diagnosis not present

## 2015-07-13 DIAGNOSIS — K2970 Gastritis, unspecified, without bleeding: Secondary | ICD-10-CM | POA: Diagnosis not present

## 2015-07-13 DIAGNOSIS — R197 Diarrhea, unspecified: Secondary | ICD-10-CM | POA: Diagnosis not present

## 2015-07-13 DIAGNOSIS — Z79899 Other long term (current) drug therapy: Secondary | ICD-10-CM | POA: Diagnosis not present

## 2015-07-13 DIAGNOSIS — Z8659 Personal history of other mental and behavioral disorders: Secondary | ICD-10-CM | POA: Diagnosis not present

## 2015-07-13 DIAGNOSIS — I10 Essential (primary) hypertension: Secondary | ICD-10-CM | POA: Insufficient documentation

## 2015-07-13 DIAGNOSIS — Z86711 Personal history of pulmonary embolism: Secondary | ICD-10-CM | POA: Diagnosis not present

## 2015-07-13 DIAGNOSIS — Z86718 Personal history of other venous thrombosis and embolism: Secondary | ICD-10-CM | POA: Diagnosis not present

## 2015-07-13 LAB — COMPREHENSIVE METABOLIC PANEL
ALK PHOS: 96 U/L (ref 38–126)
ALT: 18 U/L (ref 14–54)
AST: 30 U/L (ref 15–41)
Albumin: 3.3 g/dL — ABNORMAL LOW (ref 3.5–5.0)
Anion gap: 14 (ref 5–15)
BUN: 14 mg/dL (ref 6–20)
CHLORIDE: 97 mmol/L — AB (ref 101–111)
CO2: 25 mmol/L (ref 22–32)
CREATININE: 1.06 mg/dL — AB (ref 0.44–1.00)
Calcium: 9.2 mg/dL (ref 8.9–10.3)
GFR calc Af Amer: >60 mL/min (ref >60)
Glucose, Bld: 116 mg/dL — ABNORMAL HIGH (ref 65–99)
Potassium: 3.3 mmol/L — ABNORMAL LOW (ref 3.5–5.1)
SODIUM: 136 mmol/L (ref 135–145)
Total Bilirubin: 1 mg/dL (ref 0.3–1.2)
Total Protein: 7.4 g/dL (ref 6.5–8.1)

## 2015-07-13 LAB — CBC
HCT: 40.1 % (ref 36.0–46.0)
Hemoglobin: 13.7 g/dL (ref 12.0–15.0)
MCH: 26.7 pg (ref 26.0–34.0)
MCHC: 34.2 g/dL (ref 30.0–36.0)
MCV: 78 fL (ref 78.0–100.0)
PLATELETS: 315 10*3/uL (ref 150–400)
RBC: 5.14 MIL/uL — ABNORMAL HIGH (ref 3.87–5.11)
RDW: 14.9 % (ref 11.5–15.5)
WBC: 9.3 10*3/uL (ref 4.0–10.5)

## 2015-07-13 LAB — LIPASE, BLOOD: LIPASE: 21 U/L (ref 11–51)

## 2015-07-13 MED ORDER — MORPHINE SULFATE (PF) 4 MG/ML IV SOLN
4.0000 mg | Freq: Once | INTRAVENOUS | Status: AC
  Administered 2015-07-14: 4 mg via INTRAVENOUS
  Filled 2015-07-13: qty 1

## 2015-07-13 MED ORDER — SODIUM CHLORIDE 0.9 % IV BOLUS (SEPSIS)
1000.0000 mL | Freq: Once | INTRAVENOUS | Status: AC
  Administered 2015-07-14: 1000 mL via INTRAVENOUS

## 2015-07-13 MED ORDER — ONDANSETRON HCL 4 MG/2ML IJ SOLN
4.0000 mg | Freq: Once | INTRAMUSCULAR | Status: AC
  Administered 2015-07-14: 4 mg via INTRAVENOUS
  Filled 2015-07-13: qty 2

## 2015-07-13 MED ORDER — ONDANSETRON 4 MG PO TBDP
ORAL_TABLET | ORAL | Status: AC
  Filled 2015-07-13: qty 1

## 2015-07-13 MED ORDER — ONDANSETRON 4 MG PO TBDP
4.0000 mg | ORAL_TABLET | Freq: Once | ORAL | Status: AC | PRN
Start: 2015-07-13 — End: 2015-07-13
  Administered 2015-07-13: 4 mg via ORAL

## 2015-07-13 NOTE — ED Notes (Signed)
Onset today 0700 nausea emesis and diarrhea after drinking a couple of sips of vinegar for a headache. During the day headache resolved however developed abdominal pain in all quadrants.

## 2015-07-13 NOTE — ED Provider Notes (Signed)
CSN: [id number]     Arrival date & time 07/13/15  1932 History   By signing my name below, I, Forrestine Him, attest that this documentation has been prepared under the direction and in the presence of Merryl Hacker, MD.  Electronically Signed: Forrestine Him, ED Scribe. 07/13/2015. 11:51 PM.   Chief Complaint  Patient presents with  . Nausea  . Emesis  . Diarrhea  . Abdominal Pain   The history is provided by the patient. No language interpreter was used.    HPI Comments: Alice [name] is a 45 y.o. female with a PMHx of HTN, DVT, and PE who presents to the Emergency Department complaining of constant, ongoing abdominal pain onset 7:00 PM this evening. Pt currently rates pain 7/10. She also reports nausea, vomiting, and diarrhea. Pt is unable to keep any solid foods or liquids down at this time. No aggravating or alleviating factors reported. No OTC medications or home remedies attempted prior to arrival. No recent fever, chills, cough, or shortness of breath. PSHx includes Cholecystomy 04/18/15 performed by Ralene Ok, MD.  PCP: Benito Mccreedy, MD    Past Medical History  Diagnosis Date  . Hypertension   . Varicose veins of right lower extremity   . Anemia   . GERD (gastroesophageal reflux disease)   . DVT (deep venous thrombosis) ([facility])     BLE  . Pulmonary embolism ([place])   . Obesity   . Sinusitis nasal   . Pneumonia 2014  . Chronic bronchitis ([place])   . Sleep apnea     "I'm suppose to wear a mask but I don't" (04/16/2015)  . Headache     "weekly" (04/16/2015)  . Migraine     "monthly" (04/16/2015)  . Chronic upper back pain   . Anxiety   . Depression    Past Surgical History  Procedure Laterality Date  . Incision and drainage Right 09/10/2008    leg:  skin and soft tissue and muscle/notes 09/15/2010  . Laparoscopic gastric bypass  ~ 2007  . Incision and drainage Right 01/01/2008    Chronic venous stasis insufficiency ulcer,/notes 09/14/2010/  . Incision and drainage  Right [id number]    calf w/application wound vac/notes 07/20/2010  . Hysteroscopy w/d&c  12/24/2001    Archie Endo 09/28/2010  . Tonsillectomy    . Cholecystectomy N/A 04/18/2015    Procedure: LAPAROSCOPIC CHOLECYSTECTOMY;  Surgeon: Ralene Ok, MD;  Location: [facility] OR;  Service: General;  Laterality: N/A;   Family History  Problem Relation Age of Onset  . Kidney disease Mother     kidney transplant   Social History  Substance Use Topics  . Smoking status: Never Smoker   . Smokeless tobacco: Never Used  . Alcohol Use: 2.4 oz/week    4 Glasses of wine per week   OB History    Gravida Para Term Preterm AB TAB SAB Ectopic Multiple Living   0 0 0 0 0 0 0 0 0 0      Review of Systems  Constitutional: Negative for fever and chills.  HENT: Negative for congestion.   Respiratory: Negative for cough and shortness of breath.   Cardiovascular: Negative for chest pain.  Gastrointestinal: Positive for nausea, vomiting, abdominal pain and diarrhea.  Neurological: Negative for headaches.  Psychiatric/Behavioral: Negative for confusion.  All other systems reviewed and are negative.     Allergies  Oxycodone hcl  Home Medications   Prior to Admission medications   Medication Sig Start Date End Date Taking? Authorizing Provider  amLODipine (NORVASC) 10 MG tablet Take 1 tablet (10 mg total) by mouth daily. 05/20/13  Yes [name], MD  hydrochlorothiazide (HYDRODIURIL) 25 MG tablet Take 25 mg by mouth daily.   Yes Historical Provider, MD  warfarin (COUMADIN) 2.5 MG tablet Take 5 tablets (12.5 mg total) by mouth one time only at 6 PM. Take daily until INR checked and then per PCP. Patient taking differently: Take 10 mg by mouth daily.  05/20/13  Yes [name], MD  ciprofloxacin (CIPRO) 500 MG tablet Take 1 tablet (500 mg total) by mouth 2 (two) times daily. Patient not taking: Reported on 07/14/2015 05/04/15   Nat Christen, PA-C  docusate sodium (COLACE) 100 MG capsule Take 1 capsule (100  mg total) by mouth 2 (two) times daily. Patient not taking: Reported on 04/05/2014 11/25/13   Allen Norris, MD  HYDROcodone-acetaminophen (NORCO) 7.5-325 MG tablet Take 1-2 tablets by mouth every 6 (six) hours as needed for moderate pain or severe pain. Patient not taking: Reported on 07/14/2015 05/04/15   Nat Christen, PA-C  loperamide (IMODIUM) 2 MG capsule Take 1 capsule (2 mg total) by mouth 4 (four) times daily as needed for diarrhea or loose stools. 07/14/15   Merryl Hacker, MD  ondansetron (ZOFRAN ODT) 4 MG disintegrating tablet Take 1 tablet (4 mg total) by mouth every 8 (eight) hours as needed for nausea or vomiting. 07/14/15   Merryl Hacker, MD  pantoprazole (PROTONIX) 20 MG tablet Take 1 tablet (20 mg total) by mouth daily. Patient not taking: Reported on 04/16/2015 03/01/15   Clayton Bibles, PA-C   Triage Vitals: BP 119/70 mmHg  Pulse 104  Temp(Src) 97.9 F (36.6 C) (Oral)  Resp 18  Ht 5\' 8"  (1.727 m)  Wt 310 lb (140.615 kg)  BMI 47.15 kg/m2  SpO2 96%   Physical Exam  Constitutional: She is oriented to person, place, and time. She appears well-developed and well-nourished.  Overweight  HENT:  Head: Normocephalic and atraumatic.  Cardiovascular: Normal rate, regular rhythm and normal heart sounds.   Pulmonary/Chest: Effort normal and breath sounds normal. No respiratory distress. She has no wheezes.  Abdominal: Soft. Bowel sounds are normal. There is tenderness. There is no rebound and no guarding.  Diffuse tenderness to palpation, no signs of peritonitis  Neurological: She is alert and oriented to person, place, and time.  Skin: Skin is warm and dry.  Psychiatric: She has a normal mood and affect.  Nursing note and vitals reviewed.   ED Course  Procedures (including critical care time)  DIAGNOSTIC STUDIES: Oxygen Saturation is 99% on RA, Normal by my interpretation.    COORDINATION OF CARE: 11:44 PM- Will order blood work, urinalysis, and EKG. Will give fluids,  Zofran, and Morphine. Discussed treatment plan with pt at bedside and pt agreed to plan.     Labs Review Labs Reviewed  COMPREHENSIVE METABOLIC PANEL - Abnormal; Notable for the following:    Potassium 3.3 (*)    Chloride 97 (*)    Glucose, Bld 116 (*)    Creatinine, Ser 1.06 (*)    Albumin 3.3 (*)    All other components within normal limits  CBC - Abnormal; Notable for the following:    RBC 5.14 (*)    All other components within normal limits  URINALYSIS, ROUTINE W REFLEX MICROSCOPIC (NOT AT [facility]) - Abnormal; Notable for the following:    APPearance CLOUDY (*)    Bilirubin Urine SMALL (*)  Ketones, ur 15 (*)    Protein, ur 30 (*)    All other components within normal limits  PROTIME-INR - Abnormal; Notable for the following:    Prothrombin Time 27.8 (*)    INR 2.64 (*)    All other components within normal limits  URINE MICROSCOPIC-ADD ON - Abnormal; Notable for the following:    Squamous Epithelial / LPF 6-30 (*)    Bacteria, UA MANY (*)    All other components within normal limits  LIPASE, BLOOD    Imaging Review No results found. I have personally reviewed and evaluated these lab results as part of my medical decision-making.   EKG Interpretation   Date/Time:  Monday July 13 2015 19:45:25 EST Ventricular Rate:  106 PR Interval:  142 QRS Duration: 80 QT Interval:  324 QTC Calculation: 430 R Axis:   17 Text Interpretation:  Sinus tachycardia with occasional Premature  ventricular complexes Nonspecific T wave abnormality Abnormal ECG  Confirmed by Adaiah Jaskot  MD, Eron Goble ([id number]) on 07/13/2015 11:41:57 PM      MDM   Final diagnoses:  Abdominal pain, vomiting, and diarrhea    Patient presents with symptoms consistent with gastroenteritis. Her lab work and workup is largely reassuring. She has no signs of peritonitis on exam. She was given pain and nausea medication. She was able to tolerate fluids and on repeat exam is improved.  After history, exam, and  medical workup I feel the patient has been appropriately medically screened and is safe for discharge home. Pertinent diagnoses were discussed with the patient. Patient was given return precautions.   I personally performed the services described in this documentation, which was scribed in my presence. The recorded information has been reviewed and is accurate.   Merryl Hacker, MD 07/15/15 [phone]

## 2015-07-13 NOTE — ED Notes (Signed)
Pt escorted back to room via wheelchair, states she's been experiencing N/V/D today but states "I could go home now." RN informed patient that if she still wanted to be seen, she's welcome to it. Changing into gown now.

## 2015-07-14 DIAGNOSIS — R1084 Generalized abdominal pain: Secondary | ICD-10-CM | POA: Diagnosis not present

## 2015-07-14 LAB — URINALYSIS, ROUTINE W REFLEX MICROSCOPIC
Glucose, UA: NEGATIVE mg/dL
Hgb urine dipstick: NEGATIVE
Ketones, ur: 15 mg/dL — AB
LEUKOCYTES UA: NEGATIVE
NITRITE: NEGATIVE
PH: 5 (ref 5.0–8.0)
Protein, ur: 30 mg/dL — AB
SPECIFIC GRAVITY, URINE: 1.016 (ref 1.005–1.030)

## 2015-07-14 LAB — URINE MICROSCOPIC-ADD ON

## 2015-07-14 LAB — PROTIME-INR
INR: 2.64 — AB (ref 0.00–1.49)
PROTHROMBIN TIME: 27.8 s — AB (ref 11.6–15.2)

## 2015-07-14 MED ORDER — ONDANSETRON 4 MG PO TBDP: 4.0000 mg | ORAL_TABLET | Freq: Three times a day (TID) | ORAL | Status: DC | PRN

## 2015-07-14 MED ORDER — LOPERAMIDE HCL 2 MG PO CAPS: 2.0000 mg | ORAL_CAPSULE | Freq: Four times a day (QID) | ORAL | Status: DC | PRN

## 2015-07-14 NOTE — ED Notes (Signed)
Pt left at this time with all belongings.  

## 2015-07-14 NOTE — ED Notes (Signed)
Provided patient with PO challenge, states nausea better at this time

## 2015-07-14 NOTE — Discharge Instructions (Signed)

## 2015-08-19 DIAGNOSIS — R1013 Epigastric pain: Secondary | ICD-10-CM | POA: Diagnosis not present

## 2015-08-19 DIAGNOSIS — I10 Essential (primary) hypertension: Secondary | ICD-10-CM | POA: Diagnosis not present

## 2015-08-19 DIAGNOSIS — E559 Vitamin D deficiency, unspecified: Secondary | ICD-10-CM | POA: Diagnosis not present

## 2015-08-19 DIAGNOSIS — F329 Major depressive disorder, single episode, unspecified: Secondary | ICD-10-CM | POA: Diagnosis not present

## 2015-08-19 DIAGNOSIS — G4733 Obstructive sleep apnea (adult) (pediatric): Secondary | ICD-10-CM | POA: Diagnosis not present

## 2015-09-18 DIAGNOSIS — I10 Essential (primary) hypertension: Secondary | ICD-10-CM | POA: Diagnosis not present

## 2015-09-18 DIAGNOSIS — G4733 Obstructive sleep apnea (adult) (pediatric): Secondary | ICD-10-CM | POA: Diagnosis not present

## 2015-09-18 DIAGNOSIS — Z5181 Encounter for therapeutic drug level monitoring: Secondary | ICD-10-CM | POA: Diagnosis not present

## 2015-09-18 DIAGNOSIS — M25561 Pain in right knee: Secondary | ICD-10-CM | POA: Diagnosis not present

## 2015-09-18 DIAGNOSIS — F329 Major depressive disorder, single episode, unspecified: Secondary | ICD-10-CM | POA: Diagnosis not present

## 2015-09-18 DIAGNOSIS — R1013 Epigastric pain: Secondary | ICD-10-CM | POA: Diagnosis not present

## 2015-09-18 DIAGNOSIS — E559 Vitamin D deficiency, unspecified: Secondary | ICD-10-CM | POA: Diagnosis not present

## 2015-09-29 DIAGNOSIS — M25761 Osteophyte, right knee: Secondary | ICD-10-CM | POA: Diagnosis not present

## 2015-09-29 DIAGNOSIS — M1711 Unilateral primary osteoarthritis, right knee: Secondary | ICD-10-CM | POA: Diagnosis not present

## 2015-09-29 DIAGNOSIS — M25561 Pain in right knee: Secondary | ICD-10-CM | POA: Diagnosis not present

## 2015-09-29 DIAGNOSIS — M2241 Chondromalacia patellae, right knee: Secondary | ICD-10-CM | POA: Diagnosis not present

## 2015-10-09 DIAGNOSIS — I10 Essential (primary) hypertension: Secondary | ICD-10-CM | POA: Diagnosis not present

## 2015-10-09 DIAGNOSIS — R05 Cough: Secondary | ICD-10-CM | POA: Diagnosis not present

## 2015-10-09 DIAGNOSIS — J0100 Acute maxillary sinusitis, unspecified: Secondary | ICD-10-CM | POA: Diagnosis not present

## 2015-10-09 DIAGNOSIS — R1013 Epigastric pain: Secondary | ICD-10-CM | POA: Diagnosis not present

## 2015-10-09 DIAGNOSIS — G629 Polyneuropathy, unspecified: Secondary | ICD-10-CM | POA: Diagnosis not present

## 2015-10-09 DIAGNOSIS — F329 Major depressive disorder, single episode, unspecified: Secondary | ICD-10-CM | POA: Diagnosis not present

## 2015-10-09 DIAGNOSIS — E559 Vitamin D deficiency, unspecified: Secondary | ICD-10-CM | POA: Diagnosis not present

## 2015-10-30 DIAGNOSIS — G629 Polyneuropathy, unspecified: Secondary | ICD-10-CM | POA: Diagnosis not present

## 2015-10-30 DIAGNOSIS — I10 Essential (primary) hypertension: Secondary | ICD-10-CM | POA: Diagnosis not present

## 2015-10-30 DIAGNOSIS — F329 Major depressive disorder, single episode, unspecified: Secondary | ICD-10-CM | POA: Diagnosis not present

## 2015-10-30 DIAGNOSIS — R1013 Epigastric pain: Secondary | ICD-10-CM | POA: Diagnosis not present

## 2015-10-30 DIAGNOSIS — E559 Vitamin D deficiency, unspecified: Secondary | ICD-10-CM | POA: Diagnosis not present

## 2015-10-30 DIAGNOSIS — G4733 Obstructive sleep apnea (adult) (pediatric): Secondary | ICD-10-CM | POA: Diagnosis not present

## 2015-11-04 ENCOUNTER — Other Ambulatory Visit: Payer: Self-pay | Admitting: *Deleted

## 2015-11-04 ENCOUNTER — Encounter: Payer: Self-pay | Admitting: Surgery

## 2015-11-04 DIAGNOSIS — I83893 Varicose veins of bilateral lower extremities with other complications: Secondary | ICD-10-CM

## 2015-11-12 ENCOUNTER — Inpatient Hospital Stay (HOSPITAL_COMMUNITY)
Admission: AD | Admit: 2015-11-12 | Discharge: 2015-11-12 | Disposition: A | Discharge status: Home / Self Care | Payer: Medicare Other | Source: Ambulatory Visit | Attending: Family Medicine | Admitting: Family Medicine

## 2015-11-12 ENCOUNTER — Encounter (HOSPITAL_COMMUNITY): Payer: Self-pay | Admitting: *Deleted

## 2015-11-12 DIAGNOSIS — R102 Pelvic and perineal pain: Secondary | ICD-10-CM

## 2015-11-12 DIAGNOSIS — A5901 Trichomonal vulvovaginitis: Secondary | ICD-10-CM | POA: Diagnosis not present

## 2015-11-12 DIAGNOSIS — Z86718 Personal history of other venous thrombosis and embolism: Secondary | ICD-10-CM | POA: Insufficient documentation

## 2015-11-12 DIAGNOSIS — F329 Major depressive disorder, single episode, unspecified: Secondary | ICD-10-CM | POA: Diagnosis not present

## 2015-11-12 DIAGNOSIS — Z86711 Personal history of pulmonary embolism: Secondary | ICD-10-CM | POA: Insufficient documentation

## 2015-11-12 DIAGNOSIS — I10 Essential (primary) hypertension: Secondary | ICD-10-CM | POA: Insufficient documentation

## 2015-11-12 DIAGNOSIS — F419 Anxiety disorder, unspecified: Secondary | ICD-10-CM | POA: Diagnosis not present

## 2015-11-12 DIAGNOSIS — K219 Gastro-esophageal reflux disease without esophagitis: Secondary | ICD-10-CM | POA: Insufficient documentation

## 2015-11-12 DIAGNOSIS — Z7901 Long term (current) use of anticoagulants: Secondary | ICD-10-CM | POA: Diagnosis not present

## 2015-11-12 LAB — POCT PREGNANCY, URINE: PREG TEST UR: NEGATIVE

## 2015-11-12 LAB — URINE MICROSCOPIC-ADD ON

## 2015-11-12 LAB — WET PREP, GENITAL
Sperm: NONE SEEN
Yeast Wet Prep HPF POC: NONE SEEN

## 2015-11-12 LAB — URINALYSIS, ROUTINE W REFLEX MICROSCOPIC
Bilirubin Urine: NEGATIVE
Glucose, UA: NEGATIVE mg/dL
Ketones, ur: NEGATIVE mg/dL
Leukocytes, UA: NEGATIVE
NITRITE: NEGATIVE
PROTEIN: NEGATIVE mg/dL
SPECIFIC GRAVITY, URINE: 1.02 (ref 1.005–1.030)
pH: 5.5 (ref 5.0–8.0)

## 2015-11-12 MED ORDER — KETOROLAC TROMETHAMINE 30 MG/ML IJ SOLN
INTRAMUSCULAR | Status: AC
  Filled 2015-11-12: qty 1

## 2015-11-12 MED ORDER — SCOPOLAMINE 1 MG/3DAYS TD PT72
MEDICATED_PATCH | TRANSDERMAL | Status: AC
  Filled 2015-11-12: qty 1

## 2015-11-12 MED ORDER — MIDAZOLAM HCL 2 MG/2ML IJ SOLN
INTRAMUSCULAR | Status: AC
  Filled 2015-11-12: qty 2

## 2015-11-12 MED ORDER — METRONIDAZOLE 500 MG PO TABS
2000.0000 mg | ORAL_TABLET | Freq: Once | ORAL | Status: AC
  Administered 2015-11-12: 2000 mg via ORAL
  Filled 2015-11-12: qty 4

## 2015-11-12 MED ORDER — DEXAMETHASONE SODIUM PHOSPHATE 4 MG/ML IJ SOLN
INTRAMUSCULAR | Status: AC
  Filled 2015-11-12: qty 1

## 2015-11-12 MED ORDER — FENTANYL CITRATE (PF) 250 MCG/5ML IJ SOLN
INTRAMUSCULAR | Status: AC
  Filled 2015-11-12: qty 5

## 2015-11-12 MED ORDER — PROPOFOL 10 MG/ML IV BOLUS
INTRAVENOUS | Status: AC
  Filled 2015-11-12: qty 20

## 2015-11-12 MED ORDER — LIDOCAINE HCL (CARDIAC) 20 MG/ML IV SOLN
INTRAVENOUS | Status: AC
  Filled 2015-11-12: qty 5

## 2015-11-12 MED ORDER — ONDANSETRON HCL 4 MG/2ML IJ SOLN
INTRAMUSCULAR | Status: AC
  Filled 2015-11-12: qty 2

## 2015-11-12 NOTE — MAU Note (Addendum)
Pt having pelvic pain x 1 week. Has a Mirena in 2013 in place feels like it may have "moved."

## 2015-11-12 NOTE — MAU Provider Note (Signed)
Chief Complaint:  Vaginal Bleeding   First Provider Initiated Contact with Patient 11/12/15 1628       Alice [name] is a 45 y.o. G0P0000 who presents to maternity admissions reporting pelvic cramping.  Also has some discharge.  Not much vaginal irritation.  Thinks her Mirena may have moved.  . She reports vaginal bleeding, vaginal itching/burning, urinary symptoms, h/a, dizziness, n/v, or fever/chills.    RN Note:  Expand All Collapse All   Pt having pelvic pain x 1 week. Has a Mirena in 2013 in place feels like it may have "moved."          Pelvic Pain The patient's primary symptoms include genital itching, pelvic pain and vaginal discharge. The patient's pertinent negatives include no genital lesions, genital odor or vaginal bleeding. This is a new problem. The current episode started in the past 7 days. The problem occurs constantly. The problem has been unchanged. The pain is mild. The problem affects both sides. She is not pregnant. Associated symptoms include abdominal pain. Pertinent negatives include no back pain, chills, constipation, diarrhea, dysuria, fever, flank pain, nausea or vomiting. The vaginal discharge was milky. There has been no bleeding. She has not been passing clots. She has not been passing tissue. Nothing aggravates the symptoms. She has tried nothing for the symptoms. She is sexually active. It is unknown whether or not her partner has an STD. She uses an IUD for contraception. Her menstrual history has been regular.    Past Medical History: Past Medical History  Diagnosis Date  . Hypertension   . Varicose veins of right lower extremity   . Anemia   . GERD (gastroesophageal reflux disease)   . DVT (deep venous thrombosis) (HCC)     BLE  . Pulmonary embolism ([place])   . Obesity   . Sinusitis nasal   . Pneumonia 2014  . Chronic bronchitis ([place])   . Sleep apnea     "I'm suppose to wear a mask but I don't" (04/16/2015)  . Headache     "weekly" (04/16/2015)   . Migraine     "monthly" (04/16/2015)  . Chronic upper back pain   . Anxiety   . Depression     Past obstetric history: OB History  Gravida Para Term Preterm AB SAB TAB Ectopic Multiple Living  0 0 0 0 0 0 0 0 0 0        Past Surgical History: Past Surgical History  Procedure Laterality Date  . Incision and drainage Right 09/10/2008    leg:  skin and soft tissue and muscle/notes 09/15/2010  . Laparoscopic gastric bypass  ~ 2007  . Incision and drainage Right 01/01/2008    Chronic venous stasis insufficiency ulcer,/notes 09/14/2010/  . Incision and drainage Right 10/3873    calf w/application wound vac/notes 07/20/2010  . Hysteroscopy w/d&c  12/24/2001    Archie Endo 09/28/2010  . Tonsillectomy    . Cholecystectomy N/A 04/18/2015    Procedure: LAPAROSCOPIC CHOLECYSTECTOMY;  Surgeon: Ralene Ok, MD;  Location: [facility] OR;  Service: General;  Laterality: N/A;    Family History: Family History  Problem Relation Age of Onset  . Kidney disease Mother     kidney transplant    Social History: Social History  Substance Use Topics  . Smoking status: Never Smoker   . Smokeless tobacco: Never Used  . Alcohol Use: 2.4 oz/week    4 Glasses of wine per week    Allergies:  Allergies  Allergen Reactions  .  Oxycodone Hcl Nausea Only    Meds:  Prescriptions prior to admission  Medication Sig Dispense Refill Last Dose  . albuterol (PROVENTIL HFA;VENTOLIN HFA) 108 (90 Base) MCG/ACT inhaler Inhale 3 puffs into the lungs every 6 (six) hours as needed for wheezing or shortness of breath.   Past Week at Unknown time  . amLODipine (NORVASC) 10 MG tablet Take 1 tablet (10 mg total) by mouth daily. 31 tablet 0 11/11/2015 at Unknown time  . buPROPion (WELLBUTRIN XL) 150 MG 24 hr tablet Take 150 mg by mouth daily.   11/11/2015 at Unknown time  . Cholecalciferol (VITAMIN D3) 5000 units CAPS Take 1 capsule by mouth daily.    11/11/2015 at Unknown time  . hydrochlorothiazide (HYDRODIURIL) 25 MG tablet Take  25 mg by mouth daily.   11/11/2015 at Unknown time  . levonorgestrel (MIRENA) 20 MCG/24HR IUD 1 each by Intrauterine route once. Placed in 2013.   Continuous  . phentermine 37.5 MG capsule Take 37.5 mg by mouth daily.   11/11/2015 at Unknown time  . warfarin (COUMADIN) 2.5 MG tablet Take 5 tablets (12.5 mg total) by mouth one time only at 6 PM. Take daily until INR checked and then per PCP. (Patient taking differently: Take 10 mg by mouth daily. ) 30 tablet 0 11/11/2015 at Unknown time  . loperamide (IMODIUM) 2 MG capsule Take 1 capsule (2 mg total) by mouth 4 (four) times daily as needed for diarrhea or loose stools. (Patient not taking: Reported on 11/12/2015) 12 capsule 0 Not Taking at Unknown time  . ondansetron (ZOFRAN ODT) 4 MG disintegrating tablet Take 1 tablet (4 mg total) by mouth every 8 (eight) hours as needed for nausea or vomiting. (Patient not taking: Reported on 11/12/2015) 20 tablet 0 Not Taking at Unknown time    I have reviewed patient's Past Medical Hx, Surgical Hx, Family Hx, Social Hx, medications and allergies.  ROS:  Review of Systems  Constitutional: Negative for fever and chills.  Gastrointestinal: Positive for abdominal pain. Negative for nausea, vomiting, diarrhea and constipation.  Genitourinary: Positive for vaginal discharge and pelvic pain. Negative for dysuria and flank pain.  Musculoskeletal: Negative for back pain.   Other systems negative     Physical Exam  Patient Vitals for the past 24 hrs:  BP Temp Pulse Resp Height Weight  11/12/15 1226 133/64 mmHg 98.3 F (36.8 C) 82 18 5' 4" (1.626 m) 269 lb 1.9 oz (122.072 kg)   Constitutional: Well-developed, well-nourished female in no acute distress.  Cardiovascular: normal rate and rhythm, no ectopy audible, S1 & S2 heard, no murmur Respiratory: normal effort, no distress. Lungs CTAB with no wheezes or crackles GI: Abd soft, non-tender.  Nondistended.  No rebound, No guarding.  Bowel Sounds audible  MS:  Extremities nontender, no edema, normal ROM Neurologic: Alert and oriented x 4.   Grossly nonfocal. GU: Neg CVAT. Skin:  Warm and Dry Psych:  Affect appropriate.  PELVIC EXAM: Cervix pink, visually closed, without lesion, small amount white creamy discharge, vaginal walls and external genitalia normal Bimanual exam: Cervix firm, anterior, neg CMT, uterus mildly tender, nonenlarged, adnexa without tenderness, enlargement, or mass     Tip of Mirena string felt but not seen, could possibly have migrated some, unsure of original string length    Labs: Results for orders placed or performed during the hospital encounter of 11/12/15 (from the past 24 hour(s))  Urinalysis, Routine w reflex microscopic (not at [facility])     Status: Abnormal  Collection Time: 11/12/15  2:05 PM  Result Value Ref Range   Color, Urine YELLOW YELLOW   APPearance CLEAR CLEAR   Specific Gravity, Urine 1.020 1.005 - 1.030   pH 5.5 5.0 - 8.0   Glucose, UA NEGATIVE NEGATIVE mg/dL   Hgb urine dipstick SMALL (A) NEGATIVE   Bilirubin Urine NEGATIVE NEGATIVE   Ketones, ur NEGATIVE NEGATIVE mg/dL   Protein, ur NEGATIVE NEGATIVE mg/dL   Nitrite NEGATIVE NEGATIVE   Leukocytes, UA NEGATIVE NEGATIVE  Urine microscopic-add on     Status: Abnormal   Collection Time: 11/12/15  2:05 PM  Result Value Ref Range   Squamous Epithelial / LPF 0-5 (A) NONE SEEN   WBC, UA 0-5 0 - 5 WBC/hpf   RBC / HPF 0-5 0 - 5 RBC/hpf   Bacteria, UA RARE (A) NONE SEEN   Urine-Other TRICHOMONAS PRESENT   Pregnancy, urine POC     Status: None   Collection Time: 11/12/15  2:28 PM  Result Value Ref Range   Preg Test, Ur NEGATIVE NEGATIVE  Wet prep, genital     Status: Abnormal   Collection Time: 11/12/15  4:41 PM  Result Value Ref Range   Yeast Wet Prep HPF POC NONE SEEN NONE SEEN   Trich, Wet Prep PRESENT (A) NONE SEEN   Clue Cells Wet Prep HPF POC PRESENT (A) NONE SEEN   WBC, Wet Prep HPF POC MODERATE (A) NONE SEEN   Sperm NONE SEEN    --/--/AB  POS (12/02 1023)  Imaging:  No results found.  MAU Course/MDM: I have ordered labs as follows:  Cultures and wet prep per pt request Imaging ordered: Outpatient [place] to view position of Mirena Results reviewed.      Pt stable at time of discharge.  Assessment: Lower abdominal cramping Vaginal pain Trichomonal vaginitis  Plan: Discharge home Recommend Treatment for trichomonal vaginitis.  Also offered partner treatment. Full explanation of STD repeated twice. Pt later expressed desire for partner tx. States he said he was with another woman "before he met me" Rx sent for Flagyl 2gm for trichomonal vaginitis Partner Rx given with instructions for use given also  Outpatient [place] ordered. Message to clinic to get precertification.    Medication List    ASK your doctor about these medications        albuterol 108 (90 Base) MCG/ACT inhaler  Commonly known as:  PROVENTIL HFA;VENTOLIN HFA  Inhale 3 puffs into the lungs every 6 (six) hours as needed for wheezing or shortness of breath.     amLODipine 10 MG tablet  Commonly known as:  NORVASC  Take 1 tablet (10 mg total) by mouth daily.     buPROPion 150 MG 24 hr tablet  Commonly known as:  WELLBUTRIN XL  Take 150 mg by mouth daily.     hydrochlorothiazide 25 MG tablet  Commonly known as:  HYDRODIURIL  Take 25 mg by mouth daily.     levonorgestrel 20 MCG/24HR IUD  Commonly known as:  MIRENA  1 each by Intrauterine route once. Placed in 2013.     loperamide 2 MG capsule  Commonly known as:  IMODIUM  Take 1 capsule (2 mg total) by mouth 4 (four) times daily as needed for diarrhea or loose stools.     ondansetron 4 MG disintegrating tablet  Commonly known as:  ZOFRAN ODT  Take 1 tablet (4 mg total) by mouth every 8 (eight) hours as needed for nausea or vomiting.     phentermine  37.5 MG capsule  Take 37.5 mg by mouth daily.     Vitamin D3 5000 units Caps  Take 1 capsule by mouth daily.     warfarin 2.5 MG tablet   Commonly known as:  COUMADIN  Take 5 tablets (12.5 mg total) by mouth one time only at 6 PM. Take daily until INR checked and then per PCP.       Encouraged to return here or to other Urgent Care/ED if she develops worsening of symptoms, increase in pain, fever, or other concerning symptoms.   Hansel Feinstein CNM, MSN Certified Nurse-Midwife 11/12/2015 5:09 PM

## 2015-11-12 NOTE — Discharge Instructions (Signed)
Trichomoniasis Trichomoniasis is an infection caused by an organism called Trichomonas. The infection can affect both women and men. In women, the outer female genitalia and the vagina are affected. In men, the penis is mainly affected, but the prostate and other reproductive organs can also be involved. Trichomoniasis is a sexually transmitted infection (STI) and is most often passed to another person through sexual contact.  RISK FACTORS  Having unprotected sexual intercourse.  Having sexual intercourse with an infected partner. SIGNS AND SYMPTOMS  Symptoms of trichomoniasis in women include:  Abnormal gray-green frothy vaginal discharge.  Itching and irritation of the vagina.  Itching and irritation of the area outside the vagina. Symptoms of trichomoniasis in men include:   Penile discharge with or without pain.  Pain during urination. This results from inflammation of the urethra. DIAGNOSIS  Trichomoniasis may be found during a Pap test or physical exam. Your health care provider may use one of the following methods to help diagnose this infection:  Testing the pH of the vagina with a test tape.  Using a vaginal swab test that checks for the Trichomonas organism. A test is available that provides results within a few minutes.  Examining a urine sample.  Testing vaginal secretions. Your health care provider may test you for other STIs, including HIV. TREATMENT   You may be given medicine to fight the infection. Women should inform their health care provider if they could be or are pregnant. Some medicines used to treat the infection should not be taken during pregnancy.  Your health care provider may recommend over-the-counter medicines or creams to decrease itching or irritation.  Your sexual partner will need to be treated if infected.  Your health care provider may test you for infection again 3 months after treatment. HOME CARE INSTRUCTIONS   Take medicines only as  directed by your health care provider.  Take over-the-counter medicine for itching or irritation as directed by your health care provider.  Do not have sexual intercourse while you have the infection.  Women should not douche or wear tampons while they have the infection.  Discuss your infection with your partner. Your partner may have gotten the infection from you, or you may have gotten it from your partner.  Have your sex partner get examined and treated if necessary.  Practice safe, informed, and protected sex.  See your health care provider for other STI testing. SEEK MEDICAL CARE IF:   You still have symptoms after you finish your medicine.  You develop abdominal pain.  You have pain when you urinate.  You have bleeding after sexual intercourse.  You develop a rash.  Your medicine makes you sick or makes you throw up (vomit). MAKE SURE YOU:  Understand these instructions.  Will watch your condition.  Will get help right away if you are not doing well or get worse.   This information is not intended to replace advice given to you by your health care provider. Make sure you discuss any questions you have with your health care provider.   Document Released: 10/26/2000 Document Revised: 05/23/2014 Document Reviewed: 02/11/2013 Elsevier Interactive Patient Education 2016 [employer].  Expedited Partner Therapy:  Information Sheet for Patients and Partners               You have been offered expedited partner therapy (EPT). This information sheet contains important information and warnings you need to be aware of, so please read it carefully.   Expedited Partner Therapy (EPT) is the  clinical practice of treating the sexual partners of persons who receive chlamydia, gonorrhea, or trichomoniasis diagnoses by providing medications or prescriptions to the patient. Patients then provide partners with these therapies without the health-care provider having examined the  partner. In other words, EPT is a convenient, fast and private way for patients to help their sexual partners get treated.   Chlamydia and gonorrhea are bacterial infections you get from having sex with a person who is already infected. Trichomoniasis (or trich) is a very common sexually transmitted infection (STI) that is caused by infection with a protozoan parasite called Trichomonas vaginalis.  Many people with these infections dont know it because they feel fine, but without treatment these infections can cause serious health problems, such as pelvic inflammatory disease, ectopic pregnancy, infertility and increased risk of HIV.   It is important to get treated as soon as possible to protect your health, to avoid spreading these infections to others, and to prevent yourself from becoming re-infected. The good news is these infections can be easily cured with proper antibiotic medicine. The best way to take care of your self is to see a doctor or go to your local health department. If you are not able to see a doctor or other medical provider, you should take EPT.    Recommended Medication: EPT for Chlamydia:  Azithromycin (Zithromax) 1 gram orally in a single dose EPT for Gonorrhea:  Cefixime (Suprax) 400 milligrams orally in a single dose PLUS azithromycin (Zithromax) 1 gram orally in a single dose EPT for Trichomoniasis:  Metronidazole (Flagyl) 2 grams orally in a single dose   These medicines are very safe. However, you should not take them if you have ever had an allergic reaction (like a rash) to any of these medicines: azithromycin (Zithromax), erythromycin, clarithromycin (Biaxin), metronidazole (Flagyl), tinidazole (Tindimax). If you are uncertain about whether you have an allergy, call your medical provider or pharmacist before taking this medicine. If you have a serious, long-term illness like kidney, liver or heart disease, colitis or stomach problems, or you are currently taking  other prescription medication, talk to your provider before taking this medication.   Women: If you have lower belly pain, pain during sex, vomiting, or a fever, do not take this medicine. Instead, you should see a medical provider to be certain you do not have pelvic inflammatory disease (PID). PID can be serious and lead to infertility, pregnancy problems or chronic pelvic pain.   Pregnant Women: It is very important for you to see a doctor to get pregnancy services and pre-natal care. These antibiotics for EPT are safe for pregnant women, but you still need to see a medical provider as soon as possible. It is also important to note that Doxycycline is an alternative therapy for chlamydia, but it should not be taken by someone who is pregnant.   Men: If you have pain or swelling in the testicles or a fever, do not take this medicine and see a medical provider.     Men who have sex with men (MSM): MSM in [place] continue to experience high rates of syphilis and HIV. Many MSM with gonorrhea or chlamydia could also have syphilis and/or HIV and not know it. If you are a man who has sex with other men, it is very important that you see a medical provider and are tested for HIV and syphilis. EPT is not recommended for gonorrhea for MSM.  Recommended treatment for gonorrhea for MSM is Rocephin (shot) AND  azithromycin due to decreased cure rate.  Please see your medical provider if this is the case.    Along with this information sheet is a prescription for the medicine. If you receive a prescription it will be in your name and will indicate your date of birth, or it will be in the name of Expedited Partner Therapy.   In either case, you can have the prescription filled at a pharmacy. You will be responsible for the cost of the medicine, unless you have prescription drug coverage. In that case, you could provide your name so the pharmacy could bill your health plan.   Take the medication as directed.  Some people will have a mild, upset stomach, which does not last long. AVOID alcohol 24 hours after taking metronidazole (Flagyl) to reduce the possibility of a disulfiram-like reaction (severe vomiting and abdominal pain).  After taking the medicine, do not have sex for 7 days. Do not share this medicine or give it to anyone else. It is important to tell everyone you have had sex with in the last 60 days that they need to go and get tested for sexually transmitted infections.   Ways to prevent these and other sexually transmitted infections (STIs):    Abstain from sex. This is the only sure way to avoid getting an STI.   Use barrier methods, such as condoms, consistently and correctly.   Limit the number of sexual partners.   Have regular physical exams, including testing for STIs.   For more information about EPT or other issues pertaining to an STI, please contact your medical provider or the [facility] Department at [phone] or [email or web address]

## 2015-11-13 ENCOUNTER — Telehealth: Payer: Self-pay | Admitting: *Deleted

## 2015-11-13 ENCOUNTER — Other Ambulatory Visit: Payer: Self-pay | Admitting: *Deleted

## 2015-11-13 DIAGNOSIS — R102 Pelvic and perineal pain: Secondary | ICD-10-CM

## 2015-11-13 LAB — GC/CHLAMYDIA PROBE AMP (~~LOC~~) NOT AT ARMC
Chlamydia: NEGATIVE
Neisseria Gonorrhea: NEGATIVE

## 2015-11-13 NOTE — Telephone Encounter (Signed)
Called patient, left message stating I am calling to inform her of an upcoming u/s appointment Fri, July 7 @ 1100. Please arrive 15 min early with a full bladder. Please call clinic with any questions.

## 2015-11-18 ENCOUNTER — Encounter (HOSPITAL_COMMUNITY): Payer: Self-pay

## 2015-11-18 ENCOUNTER — Inpatient Hospital Stay (HOSPITAL_COMMUNITY)
Admission: AD | Admit: 2015-11-18 | Discharge: 2015-11-18 | Disposition: A | Discharge status: Home / Self Care | Payer: Medicare Other | Source: Ambulatory Visit | Attending: Obstetrics and Gynecology | Admitting: Obstetrics and Gynecology

## 2015-11-18 DIAGNOSIS — Z3202 Encounter for pregnancy test, result negative: Secondary | ICD-10-CM

## 2015-11-18 DIAGNOSIS — N390 Urinary tract infection, site not specified: Secondary | ICD-10-CM | POA: Insufficient documentation

## 2015-11-18 DIAGNOSIS — K219 Gastro-esophageal reflux disease without esophagitis: Secondary | ICD-10-CM | POA: Diagnosis not present

## 2015-11-18 DIAGNOSIS — G43909 Migraine, unspecified, not intractable, without status migrainosus: Secondary | ICD-10-CM | POA: Insufficient documentation

## 2015-11-18 DIAGNOSIS — F419 Anxiety disorder, unspecified: Secondary | ICD-10-CM | POA: Insufficient documentation

## 2015-11-18 DIAGNOSIS — F329 Major depressive disorder, single episode, unspecified: Secondary | ICD-10-CM | POA: Diagnosis not present

## 2015-11-18 DIAGNOSIS — G8929 Other chronic pain: Secondary | ICD-10-CM | POA: Diagnosis not present

## 2015-11-18 DIAGNOSIS — Z86718 Personal history of other venous thrombosis and embolism: Secondary | ICD-10-CM | POA: Diagnosis not present

## 2015-11-18 DIAGNOSIS — E669 Obesity, unspecified: Secondary | ICD-10-CM | POA: Diagnosis not present

## 2015-11-18 DIAGNOSIS — G4730 Sleep apnea, unspecified: Secondary | ICD-10-CM | POA: Diagnosis not present

## 2015-11-18 DIAGNOSIS — Z7901 Long term (current) use of anticoagulants: Secondary | ICD-10-CM | POA: Insufficient documentation

## 2015-11-18 DIAGNOSIS — I10 Essential (primary) hypertension: Secondary | ICD-10-CM | POA: Diagnosis not present

## 2015-11-18 DIAGNOSIS — M546 Pain in thoracic spine: Secondary | ICD-10-CM | POA: Insufficient documentation

## 2015-11-18 DIAGNOSIS — Z86711 Personal history of pulmonary embolism: Secondary | ICD-10-CM | POA: Diagnosis not present

## 2015-11-18 DIAGNOSIS — N730 Acute parametritis and pelvic cellulitis: Secondary | ICD-10-CM | POA: Diagnosis not present

## 2015-11-18 LAB — CBC WITH DIFFERENTIAL/PLATELET
BASOS ABS: 0 10*3/uL (ref 0.0–0.1)
BASOS PCT: 0 %
EOS ABS: 0.1 10*3/uL (ref 0.0–0.7)
EOS PCT: 1 %
HCT: 38.1 % (ref 36.0–46.0)
Hemoglobin: 13 g/dL (ref 12.0–15.0)
LYMPHS PCT: 34 %
Lymphs Abs: 2.7 10*3/uL (ref 0.7–4.0)
MCH: 27.3 pg (ref 26.0–34.0)
MCHC: 34.1 g/dL (ref 30.0–36.0)
MCV: 79.9 fL (ref 78.0–100.0)
Monocytes Absolute: 0.1 10*3/uL (ref 0.1–1.0)
Monocytes Relative: 1 %
Neutro Abs: 5.2 10*3/uL (ref 1.7–7.7)
Neutrophils Relative %: 64 %
PLATELETS: 298 10*3/uL (ref 150–400)
RBC: 4.77 MIL/uL (ref 3.87–5.11)
RDW: 16.2 % — ABNORMAL HIGH (ref 11.5–15.5)
WBC: 8.1 10*3/uL (ref 4.0–10.5)

## 2015-11-18 LAB — URINALYSIS, ROUTINE W REFLEX MICROSCOPIC
BILIRUBIN URINE: NEGATIVE
Glucose, UA: NEGATIVE mg/dL
Ketones, ur: NEGATIVE mg/dL
Leukocytes, UA: NEGATIVE
NITRITE: NEGATIVE
PH: 5.5 (ref 5.0–8.0)
Protein, ur: 30 mg/dL — AB
SPECIFIC GRAVITY, URINE: 1.015 (ref 1.005–1.030)

## 2015-11-18 LAB — URINE MICROSCOPIC-ADD ON

## 2015-11-18 LAB — POCT PREGNANCY, URINE: Preg Test, Ur: NEGATIVE

## 2015-11-18 MED ORDER — IBUPROFEN 600 MG PO TABS
600.0000 mg | ORAL_TABLET | Freq: Once | ORAL | Status: AC
  Administered 2015-11-18: 600 mg via ORAL
  Filled 2015-11-18: qty 1

## 2015-11-18 MED ORDER — CEFTRIAXONE SODIUM 1 G IJ SOLR
1.0000 g | Freq: Once | INTRAMUSCULAR | Status: AC
  Administered 2015-11-18: 1 g via INTRAMUSCULAR
  Filled 2015-11-18: qty 10

## 2015-11-18 MED ORDER — IBUPROFEN 600 MG PO TABS: 600.0000 mg | ORAL_TABLET | Freq: Four times a day (QID) | ORAL | Status: DC | PRN

## 2015-11-18 MED ORDER — AZITHROMYCIN 250 MG PO TABS
1000.0000 mg | ORAL_TABLET | Freq: Once | ORAL | Status: AC
  Administered 2015-11-18: 1000 mg via ORAL
  Filled 2015-11-18: qty 4

## 2015-11-18 MED ORDER — AZITHROMYCIN 250 MG PO TABS: 1000.0000 mg | ORAL_TABLET | Freq: Once | ORAL | Status: DC

## 2015-11-18 MED ORDER — PHENAZOPYRIDINE HCL 100 MG PO TABS
200.0000 mg | ORAL_TABLET | Freq: Once | ORAL | Status: AC
  Administered 2015-11-18: 200 mg via ORAL
  Filled 2015-11-18: qty 2

## 2015-11-18 MED ORDER — PHENAZOPYRIDINE HCL 200 MG PO TABS: 200.0000 mg | ORAL_TABLET | Freq: Three times a day (TID) | ORAL | Status: DC

## 2015-11-18 NOTE — MAU Note (Signed)
Was here last week. Was dx with trich.  Was treated.  Now is having brownish yellow d/c and is hurting down there.  Hurts to pee.

## 2015-11-18 NOTE — Discharge Instructions (Signed)
Pelvic Inflammatory Disease Pelvic inflammatory disease (PID) refers to an infection in some or all of the female organs. The infection can be in the uterus, ovaries, fallopian tubes, or the surrounding tissues in the pelvis. PID can cause abdominal or pelvic pain that comes on suddenly (acute pelvic pain). PID is a serious infection because it can lead to lasting (chronic) pelvic pain or the inability to have children (infertility). CAUSES This condition is most often caused by an infection that is spread during sexual contact. However, the infection can also be caused by the normal bacteria that are found in the vaginal tissues if these bacteria travel upward into the reproductive organs. PID can also occur following:  The birth of a baby.  A miscarriage.  An abortion.  Major pelvic surgery.  The use of an intrauterine device (IUD).  A sexual assault. RISK FACTORS This condition is more likely to develop in women who:  Are younger than 44 years of age.  Are sexually active at [facility] age.  Use nonbarrier contraception.  Have multiple sexual partners.  Have sex with someone who has symptoms of an STD (sexually transmitted disease).  Use oral contraception. At times, certain behaviors can also increase the possibility of getting PID, such as:  Using a vaginal douche.  Having an IUD in place. SYMPTOMS Symptoms of this condition include:  Abdominal or pelvic pain.  Fever.  Chills.  Abnormal vaginal discharge.  Abnormal uterine bleeding.  Unusual pain shortly after the end of a menstrual period.  Painful urination.  Pain with sexual intercourse.  Nausea and vomiting. DIAGNOSIS To diagnose this condition, your health care provider will do a physical exam and take your medical history. A pelvic exam typically reveals great tenderness in the uterus and the surrounding pelvic tissues. You may also have tests, such as:  Lab tests, including a pregnancy test, blood  tests, and urine test.  Culture tests of the vagina and cervix to check for an STD.  Ultrasound.  A laparoscopic procedure to look inside the pelvis.  Examining vaginal secretions under a microscope. TREATMENT Treatment for this condition may involve one or more approaches.  Antibiotic medicines may be prescribed to be taken by mouth.  Sexual partners may need to be treated if the infection is caused by an STD.  For more severe cases, hospitalization may be needed to give antibiotics directly into a vein through an IV tube.  Surgery may be needed if other treatments do not help, but this is rare. It may take weeks until you are completely well. If you are diagnosed with PID, you should also be checked for human immunodeficiency virus (HIV). Your health care provider may test you for infection again 3 months after treatment. You should not have unprotected sex. HOME CARE INSTRUCTIONS  Take over-the-counter and prescription medicines only as told by your health care provider.  If you were prescribed an antibiotic medicine, take it as told by your health care provider. Do not stop taking the antibiotic even if you start to feel better.  Do not have sexual intercourse until treatment is completed or as told by your health care provider. If PID is confirmed, your recent sexual partners will need treatment, especially if you had unprotected sex.  Keep all follow-up visits as told by your health care provider. This is important. SEEK MEDICAL CARE IF:  You have increased or abnormal vaginal discharge.  Your pain does not improve.  You vomit.  You have a fever.  You  cannot tolerate your medicines.  Your partner has an STD.  You have pain when you urinate. SEEK IMMEDIATE MEDICAL CARE IF:  You have increased abdominal or pelvic pain.  You have chills.  Your symptoms are not better in 72 hours even with treatment.   This information is not intended to replace advice given to  you by your health care provider. Make sure you discuss any questions you have with your health care provider.   Document Released: 05/02/2005 Document Revised: 01/21/2015 Document Reviewed: 06/09/2014 Elsevier Interactive Patient Education 2016 [employer].  Sexually Transmitted Disease A sexually transmitted disease (STD) is a disease or infection that may be passed (transmitted) from person to person, usually during sexual activity. This may happen by way of saliva, semen, blood, vaginal mucus, or urine. Common STDs include:  Gonorrhea.  Chlamydia.  Syphilis.  HIV and AIDS.  Genital herpes.  Hepatitis B and C.  Trichomonas.  Human papillomavirus (HPV).  Pubic lice.  Scabies.  Mites.  Bacterial vaginosis. WHAT ARE CAUSES OF STDs? An STD may be caused by bacteria, a virus, or parasites. STDs are often transmitted during sexual activity if one person is infected. However, they may also be transmitted through nonsexual means. STDs may be transmitted after:   Sexual intercourse with an infected person.  Sharing sex toys with an infected person.  Sharing needles with an infected person or using unclean piercing or tattoo needles.  Having intimate contact with the genitals, mouth, or rectal areas of an infected person.  Exposure to infected fluids during birth. WHAT ARE THE SIGNS AND SYMPTOMS OF STDs? Different STDs have different symptoms. Some people may not have any symptoms. If symptoms are present, they may include:  Painful or bloody urination.  Pain in the pelvis, abdomen, vagina, anus, throat, or eyes.  A skin rash, itching, or irritation.  Growths, ulcerations, blisters, or sores in the genital and anal areas.  Abnormal vaginal discharge with or without bad odor.  Penile discharge in men.  Fever.  Pain or bleeding during sexual intercourse.  Swollen glands in the groin area.  Yellow skin and eyes (jaundice). This is seen with  hepatitis.  Swollen testicles.  Infertility.  Sores and blisters in the mouth. HOW ARE STDs DIAGNOSED? To make a diagnosis, your health care provider may:  Take a medical history.  Perform a physical exam.  Take a sample of any discharge to examine.  Swab the throat, cervix, opening to the penis, rectum, or vagina for testing.  Test a sample of your first morning urine.  Perform blood tests.  Perform a Pap test, if this applies.  Perform a colposcopy.  Perform a laparoscopy. HOW ARE STDs TREATED? Treatment depends on the STD. Some STDs may be treated but not cured.  Chlamydia, gonorrhea, trichomonas, and syphilis can be cured with antibiotic medicine.  Genital herpes, hepatitis, and HIV can be treated, but not cured, with prescribed medicines. The medicines lessen symptoms.  Genital warts from HPV can be treated with medicine or by freezing, burning (electrocautery), or surgery. Warts may come back.  HPV cannot be cured with medicine or surgery. However, abnormal areas may be removed from the cervix, vagina, or vulva.  If your diagnosis is confirmed, your recent sexual partners need treatment. This is true even if they are symptom-free or have a negative culture or evaluation. They should not have sex until their health care providers say it is okay.  Your health care provider may test you for infection again  3 months after treatment. HOW CAN I REDUCE MY RISK OF GETTING AN STD? Take these steps to reduce your risk of getting an STD:  Use latex condoms, dental dams, and water-soluble lubricants during sexual activity. Do not use petroleum jelly or oils.  Avoid having multiple sex partners.  Do not have sex with someone who has other sex partners  Do not have sex with anyone you do not know or who is at high risk for an STD.  Avoid risky sex practices that can break your skin.  Do not have sex if you have open sores on your mouth or skin.  Avoid drinking too much  alcohol or taking illegal drugs. Alcohol and drugs can affect your judgment and put you in a vulnerable position.  Avoid engaging in oral and anal sex acts.  Get vaccinated for HPV and hepatitis. If you have not received these vaccines in the past, talk to your health care provider about whether one or both might be right for you.  If you are at risk of being infected with HIV, it is recommended that you take a prescription medicine daily to prevent HIV infection. This is called pre-exposure prophylaxis (PrEP). You are considered at risk if:  You are a man who has sex with other men (MSM).  You are a heterosexual man or woman and are sexually active with more than one partner.  You take drugs by injection.  You are sexually active with a partner who has HIV.  Talk with your health care provider about whether you are at high risk of being infected with HIV. If you choose to begin PrEP, you should first be tested for HIV. You should then be tested every 3 months for as long as you are taking PrEP. WHAT SHOULD I DO IF I THINK I HAVE AN STD?  See your health care provider.  Tell your sexual partner(s). They should be tested and treated for any STDs.  Do not have sex until your health care provider says it is okay. WHEN SHOULD I GET IMMEDIATE MEDICAL CARE? Contact your health care provider right away if:   You have severe abdominal pain.  You are a man and notice swelling or pain in your testicles.  You are a woman and notice swelling or pain in your vagina.   This information is not intended to replace advice given to you by your health care provider. Make sure you discuss any questions you have with your health care provider.   Document Released: 07/23/2002 Document Revised: 05/23/2014 Document Reviewed: 11/20/2012 Elsevier Interactive Patient Education [employer].

## 2015-11-18 NOTE — MAU Provider Note (Signed)
History     CSN: [id number]  Arrival date and time: 11/18/15 [id number]   First Provider Initiated Contact with Patient 11/18/15 2018      No chief complaint on file.  HPI   Ms.DYMONIQUE GILLEN is a 45 y.o. female [id number] here with pelvic pain. She was seen here in MAU on 6/29 and diagnosed and treated for trichomonas. Patient says she took the treatment while here in MAU and her partner was treated. She has abstained from intercourse since treatment.  She has continued to have pelvic pain since the treatment. The pain is in her lower pelvis and is a shooting pain.   OB History    Gravida Para Term Preterm AB TAB SAB Ectopic Multiple Living   0 0 0 0 0 0 0 0 0 0       Past Medical History  Diagnosis Date  . Hypertension   . Varicose veins of right lower extremity   . Anemia   . GERD (gastroesophageal reflux disease)   . DVT (deep venous thrombosis) (HCC)     BLE  . Pulmonary embolism ([place])   . Obesity   . Sinusitis nasal   . Pneumonia 2014  . Chronic bronchitis ([place])   . Sleep apnea     "I'm suppose to wear a mask but I don't" (04/16/2015)  . Headache     "weekly" (04/16/2015)  . Migraine     "monthly" (04/16/2015)  . Chronic upper back pain   . Anxiety   . Depression     Past Surgical History  Procedure Laterality Date  . Incision and drainage Right 09/10/2008    leg:  skin and soft tissue and muscle/notes 09/15/2010  . Laparoscopic gastric bypass  ~ 2007  . Incision and drainage Right 01/01/2008    Chronic venous stasis insufficiency ulcer,/notes 09/14/2010/  . Incision and drainage Right [id number]    calf w/application wound vac/notes 07/20/2010  . Hysteroscopy w/d&c  12/24/2001    Archie Endo 09/28/2010  . Tonsillectomy    . Cholecystectomy N/A 04/18/2015    Procedure: LAPAROSCOPIC CHOLECYSTECTOMY;  Surgeon: Ralene Ok, MD;  Location: [facility] OR;  Service: General;  Laterality: N/A;    Family History  Problem Relation Age of Onset  . Kidney disease Mother     kidney transplant     Social History  Substance Use Topics  . Smoking status: Never Smoker   . Smokeless tobacco: Never Used  . Alcohol Use: 2.4 oz/week    4 Glasses of wine per week    Allergies:  Allergies  Allergen Reactions  . Oxycodone Hcl Nausea Only    Prescriptions prior to admission  Medication Sig Dispense Refill Last Dose  . albuterol (PROVENTIL HFA;VENTOLIN HFA) 108 (90 Base) MCG/ACT inhaler Inhale 3 puffs into the lungs every 6 (six) hours as needed for wheezing or shortness of breath.   Past Week at Unknown time  . amLODipine (NORVASC) 10 MG tablet Take 1 tablet (10 mg total) by mouth daily. 31 tablet 0 11/11/2015 at Unknown time  . buPROPion (WELLBUTRIN XL) 150 MG 24 hr tablet Take 150 mg by mouth daily.   11/11/2015 at Unknown time  . Cholecalciferol (VITAMIN D3) 5000 units CAPS Take 1 capsule by mouth daily.    11/11/2015 at Unknown time  . hydrochlorothiazide (HYDRODIURIL) 25 MG tablet Take 25 mg by mouth daily.   11/11/2015 at Unknown time  . levonorgestrel (MIRENA) 20 MCG/24HR IUD 1 each by Intrauterine route once. Placed in 2013.  Continuous  . phentermine 37.5 MG capsule Take 37.5 mg by mouth daily.   11/11/2015 at Unknown time  . warfarin (COUMADIN) 2.5 MG tablet Take 5 tablets (12.5 mg total) by mouth one time only at 6 PM. Take daily until INR checked and then per PCP. (Patient taking differently: Take 10 mg by mouth daily. ) 30 tablet 0 11/11/2015 at Unknown time   Results for orders placed or performed during the hospital encounter of 11/18/15 (from the past 48 hour(s))  Urinalysis, Routine w reflex microscopic (not at [facility])     Status: Abnormal   Collection Time: 11/18/15  6:50 PM  Result Value Ref Range   Color, Urine YELLOW YELLOW   APPearance CLEAR CLEAR   Specific Gravity, Urine 1.015 1.005 - 1.030   pH 5.5 5.0 - 8.0   Glucose, UA NEGATIVE NEGATIVE mg/dL   Hgb urine dipstick LARGE (A) NEGATIVE   Bilirubin Urine NEGATIVE NEGATIVE   Ketones, ur NEGATIVE NEGATIVE mg/dL    Protein, ur 30 (A) NEGATIVE mg/dL   Nitrite NEGATIVE NEGATIVE   Leukocytes, UA NEGATIVE NEGATIVE  Urine microscopic-add on     Status: Abnormal   Collection Time: 11/18/15  6:50 PM  Result Value Ref Range   Squamous Epithelial / LPF 6-30 (A) NONE SEEN   WBC, UA 0-5 0 - 5 WBC/hpf   RBC / HPF 6-30 0 - 5 RBC/hpf   Bacteria, UA MANY (A) NONE SEEN  Pregnancy, urine POC     Status: None   Collection Time: 11/18/15  7:11 PM  Result Value Ref Range   Preg Test, Ur NEGATIVE NEGATIVE    Comment:        THE SENSITIVITY OF THIS METHODOLOGY IS >24 mIU/mL   CBC with Differential     Status: Abnormal   Collection Time: 11/18/15  9:04 PM  Result Value Ref Range   WBC 8.1 4.0 - 10.5 K/uL   RBC 4.77 3.87 - 5.11 MIL/uL   Hemoglobin 13.0 12.0 - 15.0 g/dL   HCT 38.1 36.0 - 46.0 %   MCV 79.9 78.0 - 100.0 fL   MCH 27.3 26.0 - 34.0 pg   MCHC 34.1 30.0 - 36.0 g/dL   RDW 16.2 (H) 11.5 - 15.5 %   Platelets 298 150 - 400 K/uL   Neutrophils Relative % 64 %   Neutro Abs 5.2 1.7 - 7.7 K/uL   Lymphocytes Relative 34 %   Lymphs Abs 2.7 0.7 - 4.0 K/uL   Monocytes Relative 1 %   Monocytes Absolute 0.1 0.1 - 1.0 K/uL   Eosinophils Relative 1 %   Eosinophils Absolute 0.1 0.0 - 0.7 K/uL   Basophils Relative 0 %   Basophils Absolute 0.0 0.0 - 0.1 K/uL    Review of Systems  Constitutional: Negative for fever and chills.  Gastrointestinal: Positive for abdominal pain. Negative for nausea and vomiting.  Genitourinary: Positive for dysuria, urgency and frequency.   Physical Exam   Blood pressure 134/88, pulse 92, temperature 98.2 F (36.8 C), temperature source Oral, resp. rate 18, weight 331 lb (150.141 kg), last menstrual period 10/31/2015.  Physical Exam  Constitutional: She is oriented to person, place, and time. She appears well-developed and well-nourished. No distress.  Genitourinary:  Speculum exam: Vagina - Small amount bubbly vaginal bleeding, mild odor Cervix - + scant vaginal bleeding   Bimanual exam: Cervix closed, CMT. IUD strings palpated  Uterus non tender, normal size Adnexa non tender, no masses bilaterally, + suprapubic tenderness  Chaperone  present for exam.  Musculoskeletal: Normal range of motion.  Neurological: She is alert and oriented to person, place, and time.  Skin: Skin is warm. She is not diaphoretic.  Psychiatric: Her behavior is normal.    MAU Course  Procedures  None  MDM  GC and wet prep reviewed from 6/29 Recent Wet prep shows moderate WBC, increase in pelvic pain, and +CMT.  Rocephin 1 gram given IM Azithromycin 1 gram PO Ibuprofen given; pain down to a 4/10.  Assessment and Plan   A:  1. PID (acute pelvic inflammatory disease)   2. Urinary tract infection, site not specified     P:  Discharge home in stable condition Rx: Azithromycin 1 gram to repeat in 1 weeks, ibuprofen, Pyridium  Return to MAU if symptoms worsen Patient is scheduled for an [place] for IUD check and is encouraged to keep this appointment.  Condoms always   Lezlie Lye, NP 11/19/2015 1:21 PM

## 2015-11-20 ENCOUNTER — Ambulatory Visit (HOSPITAL_COMMUNITY)
Admission: RE | Admit: 2015-11-20 | Discharge: 2015-11-20 | Disposition: A | Discharge status: Home / Self Care | Payer: Medicare Other | Source: Ambulatory Visit | Attending: Advanced Practice Midwife | Admitting: Advanced Practice Midwife

## 2015-11-20 DIAGNOSIS — Z975 Presence of (intrauterine) contraceptive device: Secondary | ICD-10-CM | POA: Insufficient documentation

## 2015-11-20 DIAGNOSIS — R102 Pelvic and perineal pain: Secondary | ICD-10-CM

## 2015-11-20 DIAGNOSIS — A599 Trichomoniasis, unspecified: Secondary | ICD-10-CM

## 2015-11-24 DIAGNOSIS — A599 Trichomoniasis, unspecified: Secondary | ICD-10-CM | POA: Insufficient documentation

## 2015-11-26 ENCOUNTER — Telehealth: Payer: Self-pay | Admitting: General Practice

## 2015-11-26 NOTE — Telephone Encounter (Signed)
Patient called and left message stating she is calling for her ultrasound results. Called patient & informed her of normal results. Patient verbalized understanding & asked when she could be intimate again. Told patient to wait one more week. Patient verbalized understanding & had no questions

## 2015-12-03 ENCOUNTER — Encounter: Payer: Self-pay | Admitting: Surgery

## 2015-12-07 ENCOUNTER — Encounter: Payer: Medicare Other | Admitting: Surgery

## 2015-12-07 ENCOUNTER — Ambulatory Visit (HOSPITAL_COMMUNITY): Payer: Medicare Other

## 2015-12-25 ENCOUNTER — Ambulatory Visit (HOSPITAL_COMMUNITY)
Admission: RE | Admit: 2015-12-25 | Discharge: 2015-12-25 | Disposition: A | Discharge status: Home / Self Care | Payer: Medicare Other | Source: Ambulatory Visit | Attending: Surgery | Admitting: Surgery

## 2015-12-25 DIAGNOSIS — K219 Gastro-esophageal reflux disease without esophagitis: Secondary | ICD-10-CM | POA: Insufficient documentation

## 2015-12-25 DIAGNOSIS — G4730 Sleep apnea, unspecified: Secondary | ICD-10-CM | POA: Insufficient documentation

## 2015-12-25 DIAGNOSIS — F419 Anxiety disorder, unspecified: Secondary | ICD-10-CM | POA: Diagnosis not present

## 2015-12-25 DIAGNOSIS — F329 Major depressive disorder, single episode, unspecified: Secondary | ICD-10-CM | POA: Insufficient documentation

## 2015-12-25 DIAGNOSIS — I10 Essential (primary) hypertension: Secondary | ICD-10-CM | POA: Insufficient documentation

## 2015-12-25 DIAGNOSIS — I83893 Varicose veins of bilateral lower extremities with other complications: Secondary | ICD-10-CM

## 2015-12-26 IMAGING — DX DG CHEST 2V
2 series · 2 of 2 positions shown · non-contrast
Comparison: 03/01/2015

CLINICAL DATA: Chest pain and nausea, onset tonight.

EXAM:
CHEST  2 VIEW

[chest pa]
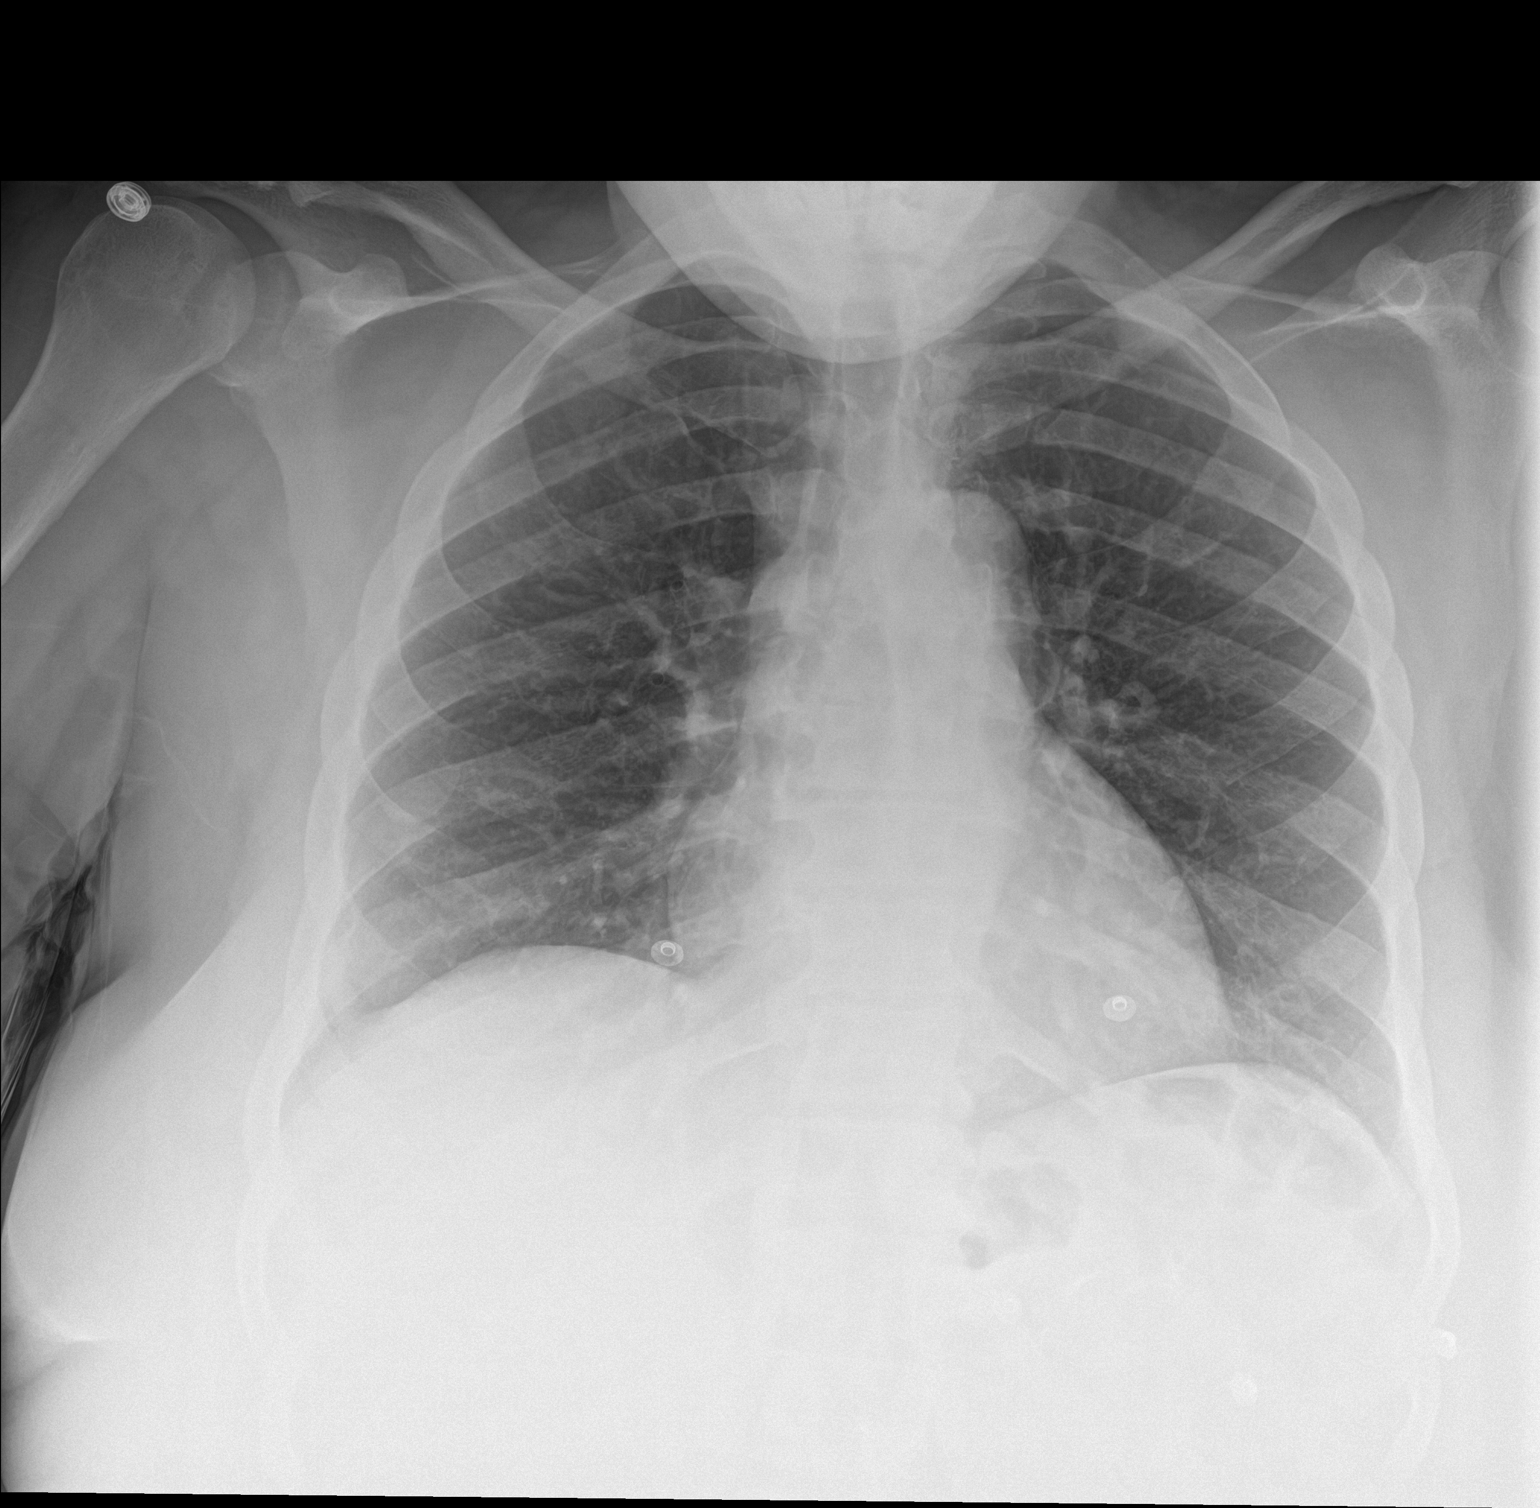

[chest lat]
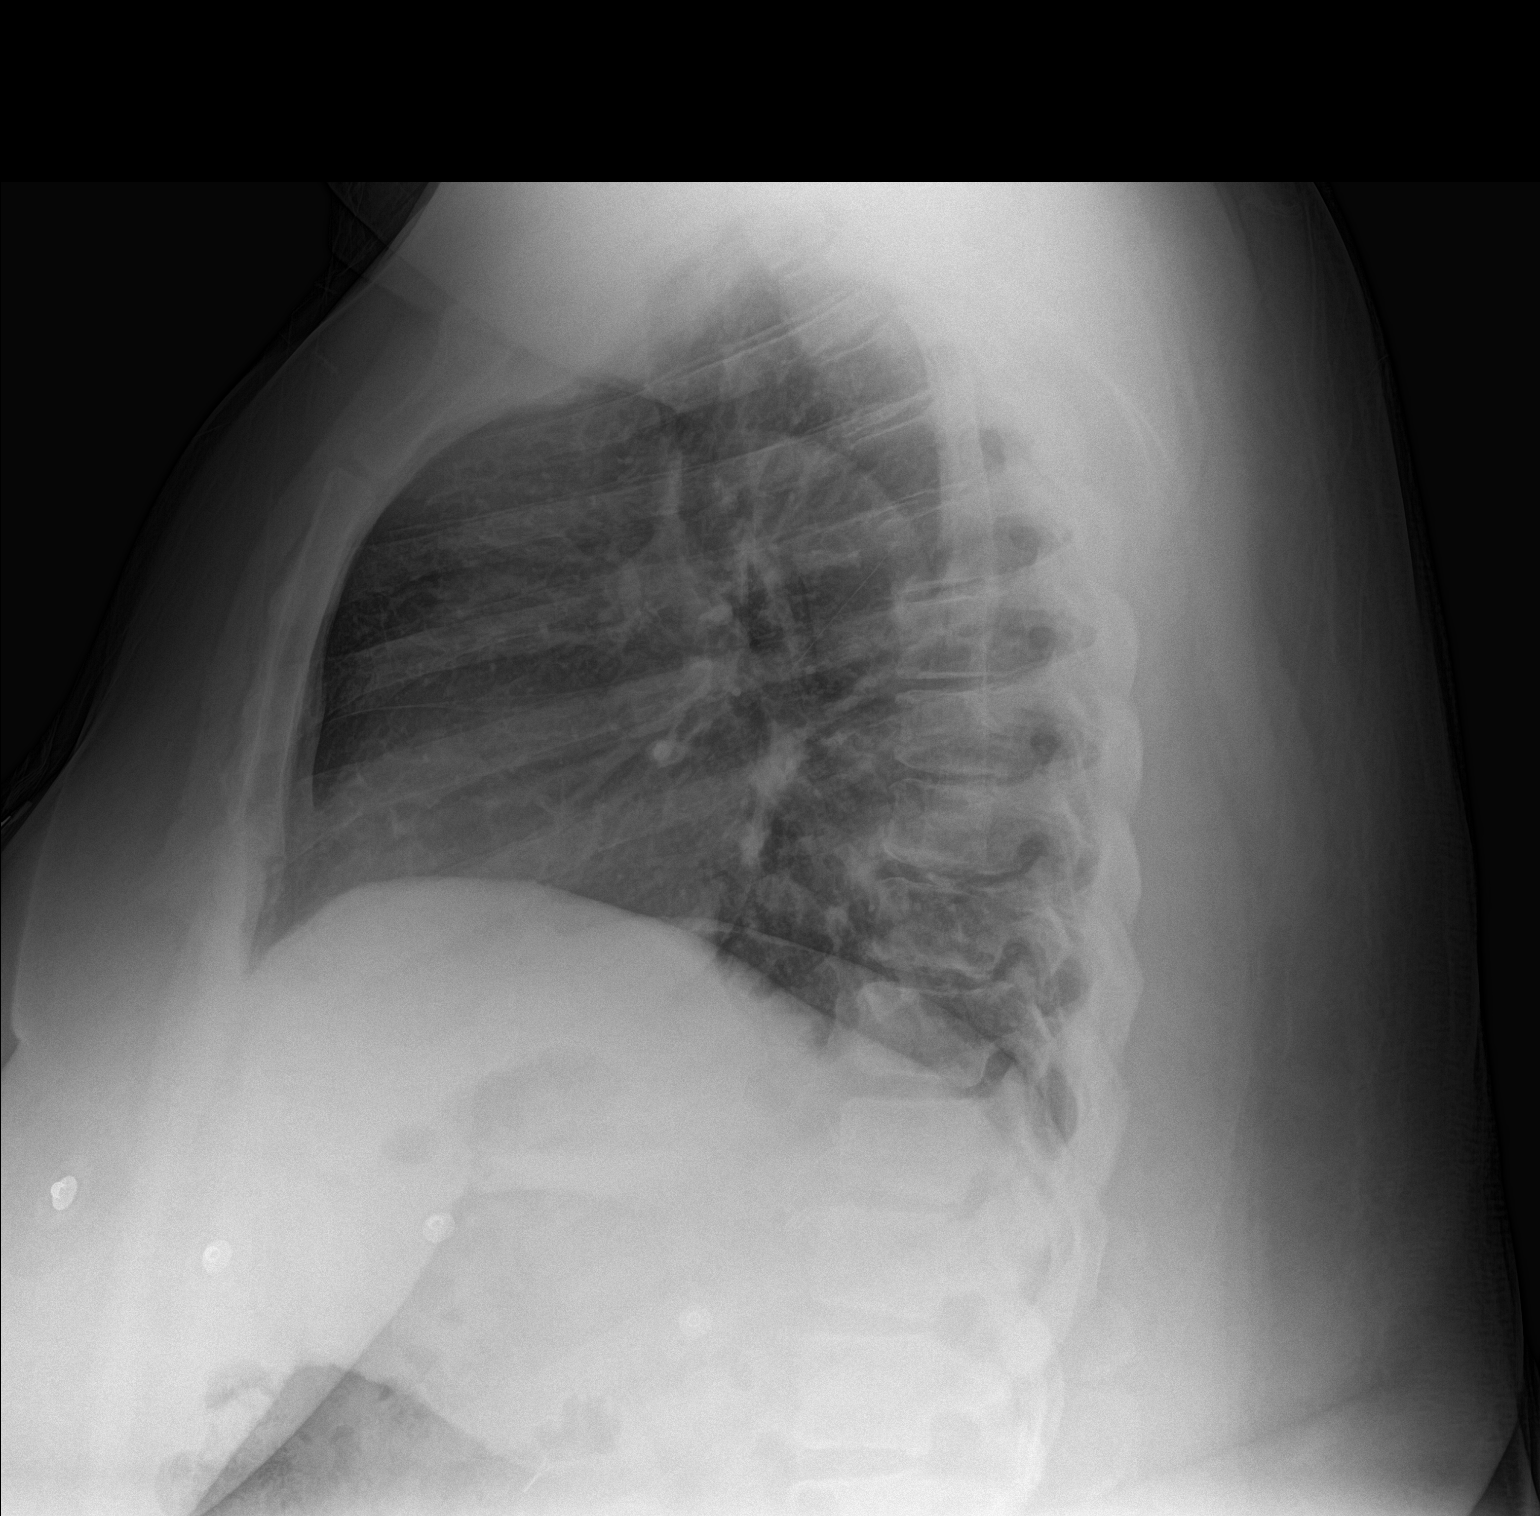

[2 of 2 positions shown; findings below may reference images not displayed]

FINDINGS: The heart size and mediastinal contours are within normal limits.
Both lungs are clear. The visualized skeletal structures are
unremarkable.
IMPRESSION: No active cardiopulmonary disease.

## 2015-12-29 ENCOUNTER — Encounter: Payer: Self-pay | Admitting: Vascular Surgery

## 2016-01-01 ENCOUNTER — Encounter: Payer: Self-pay | Admitting: Vascular Surgery

## 2016-01-01 ENCOUNTER — Ambulatory Visit (INDEPENDENT_AMBULATORY_CARE_PROVIDER_SITE_OTHER): Payer: Medicare Other | Admitting: Vascular Surgery

## 2016-01-01 VITALS — BP 128/83 | HR 93 | Temp 98.2°F | Resp 14 | Ht 68.0 in | Wt 340.0 lb

## 2016-01-01 DIAGNOSIS — I872 Venous insufficiency (chronic) (peripheral): Secondary | ICD-10-CM

## 2016-01-01 NOTE — Progress Notes (Signed)
Patient ID: Alice [name], female   DOB: 07-16-1970, 45 y.o.   MRN: [id number]  Reason for Consult: New Evaluation (bilateral varicose veins)   Referred by Benito Mccreedy, MD  Subjective:     HPI:  Alice [name] is a 45 y.o. female with obesity and history of right lower extremity DVT, located by pulmonary embolism the timing of which I'm currently unsure. She also has a history of a right medial malleolar ulcer that has been treated both here as well as probably skin grafts as reported by the patient. She now presents here for bilateral lower extremity edema as well as evaluation of her venous system given her previous right lower extremity ulceration. As noted above she does have a history of a DVT currently on Coumadin which she tolerates well. Not have any varicose veins also pain. She states that she wears compression but that she is only at night. She has no weight changes or other constitutional symptoms. She is interested in weight loss surgery should that be an option  Past Medical History:  Diagnosis Date  . Anemia   . Anxiety   . Chronic bronchitis ([place])   . Chronic upper back pain   . Depression   . DVT (deep venous thrombosis) (HCC)    BLE  . GERD (gastroesophageal reflux disease)   . Headache    "weekly" (04/16/2015)  . Hypertension   . Migraine    "monthly" (04/16/2015)  . Obesity   . Pneumonia 2014  . Pulmonary embolism ([place])   . Sinusitis nasal   . Sleep apnea    "I'm suppose to wear a mask but I don't" (04/16/2015)  . Varicose veins of right lower extremity    Family History  Problem Relation Age of Onset  . Kidney disease Mother     kidney transplant   Past Surgical History:  Procedure Laterality Date  . CHOLECYSTECTOMY N/A 04/18/2015   Procedure: LAPAROSCOPIC CHOLECYSTECTOMY;  Surgeon: Ralene Ok, MD;  Location: [facility];  Service: General;  Laterality: N/A;  . HYSTEROSCOPY W/D&C  12/24/2001   Archie Endo 09/28/2010  . INCISION AND DRAINAGE Right 09/10/2008    leg:  skin and soft tissue and muscle/notes 09/15/2010  . INCISION AND DRAINAGE Right 01/01/2008   Chronic venous stasis insufficiency ulcer,/notes 09/14/2010/  . INCISION AND DRAINAGE Right [id number]   calf w/application wound vac/notes 07/20/2010  . LAPAROSCOPIC GASTRIC BYPASS  ~ 2007  . TONSILLECTOMY      Short Social History:  Social History  Substance Use Topics  . Smoking status: Never Smoker  . Smokeless tobacco: Never Used  . Alcohol use 2.4 oz/week    4 Glasses of wine per week    Allergies  Allergen Reactions  . Oxycodone Hcl Nausea Only    Current Outpatient Prescriptions  Medication Sig Dispense Refill  . albuterol (PROVENTIL HFA;VENTOLIN HFA) 108 (90 Base) MCG/ACT inhaler Inhale 3 puffs into the lungs every 6 (six) hours as needed for wheezing or shortness of breath.    Marland Kitchen amLODipine (NORVASC) 10 MG tablet Take 1 tablet (10 mg total) by mouth daily. 31 tablet 0  . buPROPion (WELLBUTRIN XL) 150 MG 24 hr tablet Take 150 mg by mouth daily.    . Cholecalciferol (VITAMIN D3) 5000 units CAPS Take 1 capsule by mouth daily.     . hydrochlorothiazide (HYDRODIURIL) 25 MG tablet Take 25 mg by mouth daily.    Marland Kitchen ibuprofen (ADVIL,MOTRIN) 600 MG tablet Take 1 tablet (600 mg total) by mouth  every 6 (six) hours as needed. 30 tablet 0  . levonorgestrel (MIRENA) 20 MCG/24HR IUD 1 each by Intrauterine route once. Placed in 2013.    Marland Kitchen phenazopyridine (PYRIDIUM) 200 MG tablet Take 1 tablet (200 mg total) by mouth 3 (three) times daily. 6 tablet 0  . phentermine 37.5 MG capsule Take 37.5 mg by mouth daily.    Marland Kitchen warfarin (COUMADIN) 2.5 MG tablet Take 5 tablets (12.5 mg total) by mouth one time only at 6 PM. Take daily until INR checked and then per PCP. (Patient taking differently: Take 10 mg by mouth daily. ) 30 tablet 0  . azithromycin (ZITHROMAX) 250 MG tablet Take 4 tablets (1,000 mg total) by mouth once. To take in one week 7/12 (Patient not taking: Reported on 01/01/2016) 4 tablet 0   No  current facility-administered medications for this visit.     Review of Systems  Constitutional:  Constitutional negative. Respiratory: Respiratory negative.  Cardiovascular: Cardiovascular negative.  GI: Gastrointestinal negative.  Musculoskeletal: Positive for leg pain.  Skin: Positive for wound.  Hematologic:       Previous history of dvt with pe Psychiatric: Psychiatric negative.        Objective:  Objective   Vitals:   01/01/16 1505  BP: 128/83  Pulse: 93  Resp: 14  Temp: 98.2 F (36.8 C)  SpO2: 97%  Weight: (!) 340 lb (154.2 kg)  Height: 5\' 8"  (1.727 m)   Body mass index is 51.7 kg/m.  Physical Exam  Constitutional:  obese  Cardiovascular: Normal rate.   Pulmonary/Chest: Effort normal.  Abdominal: Soft. She exhibits no mass. There is no rebound and no guarding.  Musculoskeletal:  Right medial malleolar ulcer well healed No identifiable varicose veins Palpable dp bilaterally    Data: There is reflux throughout her right lower extremity in all aspects of her system. Specifically there is reflux in the proximal gsv with the size of 0.78 cm at the saphenofemoral junction. She also has reflux in her small saphenous vein on the right side is greatest 0.65 cm. On the left she has longer segment reflux in the GSV greatest dimension 0.96 cm in the proximal thigh.     Assessment/Plan:  This is a 45 year old female history of DVT PE and presumably right malleolar ulcer is now well-healed following skin grafting. She does have reflux in her right lower extremity in the deep system as well as the superficial GSv in the thigh and SSv as well. Her reflux is greater on the left side she has no previous ulceration. I spoke with her about weight loss and she is interested in pursuing weight loss surgery for which she will speak with her PCP about. I told her that ablating her saphenous veins is unlikely to resolve either pain or edema given her size and also the location of these  veins is unlikely to prevent recurrence of her medial malleolar ulcer. We will give her a prescription for medical grade 20-30 mmHg compression stockings today and give her an appointment to follow-up the vein center in 3 months. She understands there will unlikely be any intervention at the vein center and she may call to cancel this appointment.     Waynetta Sandy MD [facility]

## 2016-01-22 DIAGNOSIS — E119 Type 2 diabetes mellitus without complications: Secondary | ICD-10-CM | POA: Diagnosis not present

## 2016-01-22 DIAGNOSIS — E559 Vitamin D deficiency, unspecified: Secondary | ICD-10-CM | POA: Diagnosis not present

## 2016-01-22 DIAGNOSIS — F329 Major depressive disorder, single episode, unspecified: Secondary | ICD-10-CM | POA: Diagnosis not present

## 2016-01-22 DIAGNOSIS — R1013 Epigastric pain: Secondary | ICD-10-CM | POA: Diagnosis not present

## 2016-01-22 DIAGNOSIS — I10 Essential (primary) hypertension: Secondary | ICD-10-CM | POA: Diagnosis not present

## 2016-01-22 DIAGNOSIS — G629 Polyneuropathy, unspecified: Secondary | ICD-10-CM | POA: Diagnosis not present

## 2016-01-22 DIAGNOSIS — G4733 Obstructive sleep apnea (adult) (pediatric): Secondary | ICD-10-CM | POA: Diagnosis not present

## 2016-03-10 DIAGNOSIS — G629 Polyneuropathy, unspecified: Secondary | ICD-10-CM | POA: Diagnosis not present

## 2016-03-10 DIAGNOSIS — H538 Other visual disturbances: Secondary | ICD-10-CM | POA: Diagnosis not present

## 2016-03-10 DIAGNOSIS — Z01118 Encounter for examination of ears and hearing with other abnormal findings: Secondary | ICD-10-CM | POA: Diagnosis not present

## 2016-03-10 DIAGNOSIS — Z131 Encounter for screening for diabetes mellitus: Secondary | ICD-10-CM | POA: Diagnosis not present

## 2016-03-10 DIAGNOSIS — Z0000 Encounter for general adult medical examination without abnormal findings: Secondary | ICD-10-CM | POA: Diagnosis not present

## 2016-03-10 DIAGNOSIS — I10 Essential (primary) hypertension: Secondary | ICD-10-CM | POA: Diagnosis not present

## 2016-03-10 DIAGNOSIS — Z5181 Encounter for therapeutic drug level monitoring: Secondary | ICD-10-CM | POA: Diagnosis not present

## 2016-03-10 DIAGNOSIS — R1013 Epigastric pain: Secondary | ICD-10-CM | POA: Diagnosis not present

## 2016-03-30 ENCOUNTER — Encounter: Payer: Self-pay | Admitting: Vascular Surgery

## 2016-04-04 ENCOUNTER — Ambulatory Visit: Payer: Medicare Other | Admitting: Vascular Surgery

## 2016-05-03 DIAGNOSIS — F329 Major depressive disorder, single episode, unspecified: Secondary | ICD-10-CM | POA: Diagnosis not present

## 2016-05-03 DIAGNOSIS — E559 Vitamin D deficiency, unspecified: Secondary | ICD-10-CM | POA: Diagnosis not present

## 2016-05-03 DIAGNOSIS — R1013 Epigastric pain: Secondary | ICD-10-CM | POA: Diagnosis not present

## 2016-05-03 DIAGNOSIS — I10 Essential (primary) hypertension: Secondary | ICD-10-CM | POA: Diagnosis not present

## 2016-05-03 DIAGNOSIS — G4733 Obstructive sleep apnea (adult) (pediatric): Secondary | ICD-10-CM | POA: Diagnosis not present

## 2016-05-03 DIAGNOSIS — G629 Polyneuropathy, unspecified: Secondary | ICD-10-CM | POA: Diagnosis not present

## 2016-05-05 ENCOUNTER — Encounter: Payer: Self-pay | Admitting: Vascular Surgery

## 2016-05-10 ENCOUNTER — Other Ambulatory Visit: Payer: Self-pay | Admitting: Internal Medicine

## 2016-05-10 DIAGNOSIS — Z1231 Encounter for screening mammogram for malignant neoplasm of breast: Secondary | ICD-10-CM

## 2016-05-17 ENCOUNTER — Ambulatory Visit: Payer: Medicare Other | Admitting: Vascular Surgery

## 2016-05-25 DIAGNOSIS — E559 Vitamin D deficiency, unspecified: Secondary | ICD-10-CM | POA: Diagnosis not present

## 2016-05-25 DIAGNOSIS — Z124 Encounter for screening for malignant neoplasm of cervix: Secondary | ICD-10-CM | POA: Diagnosis not present

## 2016-05-25 DIAGNOSIS — R1013 Epigastric pain: Secondary | ICD-10-CM | POA: Diagnosis not present

## 2016-05-25 DIAGNOSIS — I10 Essential (primary) hypertension: Secondary | ICD-10-CM | POA: Diagnosis not present

## 2016-05-25 DIAGNOSIS — F329 Major depressive disorder, single episode, unspecified: Secondary | ICD-10-CM | POA: Diagnosis not present

## 2016-05-25 DIAGNOSIS — G629 Polyneuropathy, unspecified: Secondary | ICD-10-CM | POA: Diagnosis not present

## 2016-05-25 DIAGNOSIS — G4733 Obstructive sleep apnea (adult) (pediatric): Secondary | ICD-10-CM | POA: Diagnosis not present

## 2016-06-01 ENCOUNTER — Ambulatory Visit: Payer: Medicare Other

## 2016-06-02 ENCOUNTER — Encounter: Payer: Self-pay | Admitting: Vascular Surgery

## 2016-06-07 ENCOUNTER — Ambulatory Visit: Payer: Medicare Other | Admitting: Vascular Surgery

## 2016-06-24 DIAGNOSIS — I10 Essential (primary) hypertension: Secondary | ICD-10-CM | POA: Diagnosis not present

## 2016-06-24 DIAGNOSIS — R1013 Epigastric pain: Secondary | ICD-10-CM | POA: Diagnosis not present

## 2016-06-24 DIAGNOSIS — G4733 Obstructive sleep apnea (adult) (pediatric): Secondary | ICD-10-CM | POA: Diagnosis not present

## 2016-06-24 DIAGNOSIS — E559 Vitamin D deficiency, unspecified: Secondary | ICD-10-CM | POA: Diagnosis not present

## 2016-06-24 DIAGNOSIS — F329 Major depressive disorder, single episode, unspecified: Secondary | ICD-10-CM | POA: Diagnosis not present

## 2016-06-24 DIAGNOSIS — G629 Polyneuropathy, unspecified: Secondary | ICD-10-CM | POA: Diagnosis not present

## 2016-07-05 ENCOUNTER — Encounter (INDEPENDENT_AMBULATORY_CARE_PROVIDER_SITE_OTHER): Payer: Self-pay | Admitting: Orthopedic Surgery

## 2016-07-05 ENCOUNTER — Ambulatory Visit (INDEPENDENT_AMBULATORY_CARE_PROVIDER_SITE_OTHER): Payer: Medicare Other | Admitting: Orthopedic Surgery

## 2016-07-05 VITALS — Ht 68.0 in | Wt 340.0 lb

## 2016-07-05 DIAGNOSIS — L97919 Non-pressure chronic ulcer of unspecified part of right lower leg with unspecified severity: Secondary | ICD-10-CM

## 2016-07-05 DIAGNOSIS — I87331 Chronic venous hypertension (idiopathic) with ulcer and inflammation of right lower extremity: Secondary | ICD-10-CM | POA: Diagnosis not present

## 2016-07-05 MED ORDER — DOXYCYCLINE HYCLATE 100 MG PO TABS: 100.0000 mg | ORAL_TABLET | Freq: Two times a day (BID) | ORAL | 0 refills | Status: DC

## 2016-07-05 NOTE — Progress Notes (Signed)
Office Visit Note   Patient: Alice [name]           Date of Birth: 1971/04/06           MRN: [id number] Visit Date: 07/05/2016              Requested by: Benito Mccreedy, MD [address] [place], [place] [address]  PCP: Benito Mccreedy, MD  Chief Complaint  Patient presents with  . Right Leg - Open Wound    HPI: Patient is a 46 y.o female who presents today with right venous stasis leg wound. She is long time patient of ours that was last seen 2014. She has fibrinous exudative tissue. There is an odor. She is applying a dry dressing with a compression stocking. She is not currently on any oral antibiotics. Maxcine Ham, RT    Assessment & Plan: Visit Diagnoses:  1. Chronic venous hypertension (idiopathic) with ulcer and inflammation of right lower extremity (HCC)     Plan: We'll start patient on doxycycline and apply silver so and Dynaflex to the right leg. Follow-up in the office on Monday at which time anticipate we can set her up for surgical intervention with skin grafting.  Follow-Up Instructions: Return in about 1 week (around 07/12/2016).   Ortho Exam On examination patient is alert oriented no adenopathy well-dressed normal affect normal respiratory effort she has a normal gait. Examination she has massive recurrence of a venous ulcer right leg. Patient states that this is acute however appears significantly chronic. There is fibrinous exudative tissue in the wound bed there is no ascending cellulitis but there is some clear drainage. Ulcer is 8 x 9 cm. Photographs were obtained.  Imaging: No results found.  Orders:  No orders of the defined types were placed in this encounter.  No orders of the defined types were placed in this encounter.    Procedures: No procedures performed  Clinical Data: No additional findings.  Subjective: Review of Systems  Objective: Vital Signs: Ht 5\' 8"  (1.727 m)   Wt (!) 340 lb (154.2 kg)   BMI 51.70  kg/m   Specialty Comments:  No specialty comments available.  PMFS History: Patient Active Problem List   Diagnosis Date Noted  . Chronic venous hypertension (idiopathic) with ulcer and inflammation of right lower extremity ([facility]) 07/05/2016  . Trichomonal infection 11/24/2015  . Abdominal pain 05/02/2015  . Symptomatic cholelithiasis 04/16/2015  . Acute bilateral upper abdominal pain 04/16/2015  . Anemia 05/18/2013  . Hypotension, unspecified 05/17/2013  . CKD (chronic kidney disease), stage III 05/17/2013  . Anal fissure 02/06/2013  . Anal skin tag 02/06/2013  . ALLERGIC RHINITIS 03/05/2010  . LOW BACK PAIN SYNDROME 12/13/2007  . WOUND INFECTION 10/10/2007  . OBESITY, MORBID 12/02/2006  . HTN (hypertension) 12/02/2006  . History of pulmonary embolism 12/02/2006  . History of DVT (deep vein thrombosis) 12/02/2006  . 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 GASTROESOPHAGEAL REFLUX DISEASE 12/02/2006  . OSA (obstructive sleep apnea) 12/02/2006   Past Medical History:  Diagnosis Date  . Anemia   . Anxiety   . Chronic bronchitis ([facility])   . Chronic upper back pain   . Depression   . DVT (deep venous thrombosis) (HCC)    BLE  . GERD (gastroesophageal reflux disease)   . Headache    "weekly" (04/16/2015)  . Hypertension   . Migraine    "monthly" (04/16/2015)  . Obesity   . Pneumonia 2014  . Pulmonary  embolism ([facility])   . Sinusitis nasal   . Sleep apnea    "I'm suppose to wear a mask but I don't" (04/16/2015)  . Varicose veins of right lower extremity     Family History  Problem Relation Age of Onset  . Kidney disease Mother     kidney transplant    Past Surgical History:  Procedure Laterality Date  . CHOLECYSTECTOMY N/A 04/18/2015   Procedure: LAPAROSCOPIC CHOLECYSTECTOMY;  Surgeon: Ralene Ok, MD;  Location: [facility];  Service: General;  Laterality: N/A;  . HYSTEROSCOPY W/D&C  12/24/2001   Archie Endo 09/28/2010  . INCISION AND DRAINAGE Right 09/10/2008    leg:  skin and soft tissue and muscle/notes 09/15/2010  . INCISION AND DRAINAGE Right 01/01/2008   Chronic venous stasis insufficiency ulcer,/notes 09/14/2010/  . INCISION AND DRAINAGE Right 08/8590   calf w/application wound vac/notes 07/20/2010  . LAPAROSCOPIC GASTRIC BYPASS  ~ 2007  . TONSILLECTOMY     Social History   Occupational History  . Not on file.   Social History Main Topics  . Smoking status: Never Smoker  . Smokeless tobacco: Never Used  . Alcohol use 2.4 oz/week    4 Glasses of wine per week  . Drug use: No  . Sexual activity: Yes    Partners: Male    Birth control/ protection: IUD

## 2016-07-07 ENCOUNTER — Telehealth (INDEPENDENT_AMBULATORY_CARE_PROVIDER_SITE_OTHER): Payer: Self-pay | Admitting: Radiology

## 2016-07-07 MED ORDER — CEPHALEXIN 500 MG PO CAPS: 500.0000 mg | ORAL_CAPSULE | Freq: Three times a day (TID) | ORAL | 0 refills | Status: DC

## 2016-07-07 NOTE — Telephone Encounter (Signed)
I called and spoke with patient to advise doxycycline 100mg  1 po bid would interfere with warfarin. Per MD switch to keflex 500mg  1 po tid, 10 day supply. I also called pharmacy to make them aware.

## 2016-07-11 ENCOUNTER — Ambulatory Visit (INDEPENDENT_AMBULATORY_CARE_PROVIDER_SITE_OTHER): Payer: Medicare Other | Admitting: Orthopedic Surgery

## 2016-07-11 ENCOUNTER — Encounter (INDEPENDENT_AMBULATORY_CARE_PROVIDER_SITE_OTHER): Payer: Self-pay | Admitting: Orthopedic Surgery

## 2016-07-11 VITALS — Ht 68.0 in | Wt 340.0 lb

## 2016-07-11 DIAGNOSIS — I87331 Chronic venous hypertension (idiopathic) with ulcer and inflammation of right lower extremity: Secondary | ICD-10-CM | POA: Diagnosis not present

## 2016-07-11 DIAGNOSIS — L97919 Non-pressure chronic ulcer of unspecified part of right lower leg with unspecified severity: Principal | ICD-10-CM

## 2016-07-11 NOTE — Progress Notes (Signed)
Office Visit Note   Patient: Alice [name]           Date of Birth: 09-01-1970           MRN: [id number] Visit Date: 07/11/2016              Requested by: Alice Mccreedy, MD [address] [place], [place] [address] PCP: Alice Mccreedy, MD  Chief Complaint  Patient presents with  . Right Leg - Open Wound    HPI: Pt has a large calf ulcer on the right lower extremity. She had wrapped with compression wrap. She does note that the swelling has gone down a little. Pt states that she is supposed to be sch for a STSG and is hopeful to get into the socks once this has been done. Alice [name], RMA  Patient states she's been changing the dressing due to increased drainage.  Assessment & Plan: Visit Diagnoses:  1. 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 (HCC)     Plan: We'll need to continue compression wraps until we have less drainage. I'm concerned with the amount of drainage she has now that it would completely destroy any type of skin graft. Will apply silver alginate +3 layer Dynaflex wraps.  Follow-Up Instructions: Return in about 1 week (around 07/18/2016).   Ortho Exam Examination patient is alert oriented no adenopathy she has no cellulitis the ulcer has improved granulation tissue base there is no dermatitis or cellulitis. Patient has shown good interval improvement in the wound bed however the wound is still the same size  Imaging: No results found.  Orders:  No orders of the defined types were placed in this encounter.  No orders of the defined types were placed in this encounter.    Procedures: No procedures performed  Clinical Data: No additional findings.  Subjective: Review of Systems  Objective: Vital Signs: Ht 5\' 8"  (1.727 m)   Wt (!) 340 lb (154.2 kg)   BMI 51.70 kg/m   Specialty Comments:  No specialty comments available.  PMFS History: Patient Active Problem List   Diagnosis Date Noted  . 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 ([facility]) 07/05/2016  . Trichomonal infection 11/24/2015  . Abdominal pain 05/02/2015  . Symptomatic cholelithiasis 04/16/2015  . Acute bilateral upper abdominal pain 04/16/2015  . Anemia 05/18/2013  . Hypotension, unspecified 05/17/2013  . CKD (chronic kidney disease), stage III 05/17/2013  . Anal fissure 02/06/2013  . Anal skin tag 02/06/2013  . ALLERGIC RHINITIS 03/05/2010  . LOW BACK PAIN SYNDROME 12/13/2007  . WOUND INFECTION 10/10/2007  . OBESITY, MORBID 12/02/2006  . HTN (hypertension) 12/02/2006  . History of pulmonary embolism 12/02/2006  . History of DVT (deep vein thrombosis) 12/02/2006  . 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 GASTROESOPHAGEAL REFLUX DISEASE 12/02/2006  . OSA (obstructive sleep apnea) 12/02/2006   Past Medical History:  Diagnosis Date  . Anemia   . Anxiety   . Chronic bronchitis ([facility])   . Chronic upper back pain   . Depression   . DVT (deep venous thrombosis) (HCC)    BLE  . GERD (gastroesophageal reflux disease)   . Headache    "weekly" (04/16/2015)  . Hypertension   . Migraine    "monthly" (04/16/2015)  . Obesity   . Pneumonia 2014  . Pulmonary embolism ([facility])   . Sinusitis nasal   . Sleep apnea    "I'm suppose  to wear a mask but I don't" (04/16/2015)  . Varicose veins of right lower extremity     Family History  Problem Relation Age of Onset  . Kidney disease Mother     kidney transplant    Past Surgical History:  Procedure Laterality Date  . CHOLECYSTECTOMY N/A 04/18/2015   Procedure: LAPAROSCOPIC CHOLECYSTECTOMY;  Surgeon: Ralene Ok, MD;  Location: [facility];  Service: General;  Laterality: N/A;  . HYSTEROSCOPY W/D&C  12/24/2001   Archie Endo 09/28/2010  . INCISION AND DRAINAGE Right 09/10/2008   leg:  skin and soft tissue and muscle/notes 09/15/2010  . INCISION AND DRAINAGE Right 01/01/2008   Chronic venous  stasis insufficiency ulcer,/notes 09/14/2010/  . INCISION AND DRAINAGE Right 09/3965   calf w/application wound vac/notes 07/20/2010  . LAPAROSCOPIC GASTRIC BYPASS  ~ 2007  . TONSILLECTOMY     Social History   Occupational History  . Not on file.   Social History Main Topics  . Smoking status: Never Smoker  . Smokeless tobacco: Never Used  . Alcohol use 2.4 oz/week    4 Glasses of wine per week  . Drug use: No  . Sexual activity: Yes    Partners: Male    Birth control/ protection: IUD

## 2016-07-14 HISTORY — PX: INCISE AND DRAIN ABCESS: PRO64

## 2016-07-18 ENCOUNTER — Emergency Department (HOSPITAL_COMMUNITY): Payer: Medicare Other

## 2016-07-18 ENCOUNTER — Emergency Department (HOSPITAL_COMMUNITY)
Admission: EM | Admit: 2016-07-18 | Discharge: 2016-07-18 | Disposition: A | Discharge status: Home / Self Care | Payer: Medicare Other | Attending: Emergency Medicine | Admitting: Emergency Medicine

## 2016-07-18 ENCOUNTER — Ambulatory Visit (INDEPENDENT_AMBULATORY_CARE_PROVIDER_SITE_OTHER): Payer: Medicare Other | Admitting: Orthopedic Surgery

## 2016-07-18 ENCOUNTER — Encounter (HOSPITAL_COMMUNITY): Payer: Self-pay | Admitting: Emergency Medicine

## 2016-07-18 ENCOUNTER — Ambulatory Visit: Payer: Medicare Other

## 2016-07-18 DIAGNOSIS — S0990XA Unspecified injury of head, initial encounter: Secondary | ICD-10-CM | POA: Diagnosis not present

## 2016-07-18 DIAGNOSIS — Y999 Unspecified external cause status: Secondary | ICD-10-CM | POA: Diagnosis not present

## 2016-07-18 DIAGNOSIS — Z79899 Other long term (current) drug therapy: Secondary | ICD-10-CM | POA: Insufficient documentation

## 2016-07-18 DIAGNOSIS — R51 Headache: Secondary | ICD-10-CM | POA: Diagnosis not present

## 2016-07-18 DIAGNOSIS — I129 Hypertensive chronic kidney disease with stage 1 through stage 4 chronic kidney disease, or unspecified chronic kidney disease: Secondary | ICD-10-CM | POA: Diagnosis not present

## 2016-07-18 DIAGNOSIS — N183 Chronic kidney disease, stage 3 (moderate): Secondary | ICD-10-CM | POA: Diagnosis not present

## 2016-07-18 DIAGNOSIS — Y939 Activity, unspecified: Secondary | ICD-10-CM | POA: Insufficient documentation

## 2016-07-18 DIAGNOSIS — S0012XA Contusion of left eyelid and periocular area, initial encounter: Secondary | ICD-10-CM | POA: Diagnosis not present

## 2016-07-18 DIAGNOSIS — Z7901 Long term (current) use of anticoagulants: Secondary | ICD-10-CM | POA: Diagnosis not present

## 2016-07-18 DIAGNOSIS — W16212A Fall in (into) filled bathtub causing other injury, initial encounter: Secondary | ICD-10-CM | POA: Insufficient documentation

## 2016-07-18 DIAGNOSIS — S0010XA Contusion of unspecified eyelid and periocular area, initial encounter: Secondary | ICD-10-CM

## 2016-07-18 DIAGNOSIS — T148XXA Other injury of unspecified body region, initial encounter: Secondary | ICD-10-CM

## 2016-07-18 DIAGNOSIS — Y929 Unspecified place or not applicable: Secondary | ICD-10-CM | POA: Diagnosis not present

## 2016-07-18 DIAGNOSIS — W19XXXA Unspecified fall, initial encounter: Secondary | ICD-10-CM

## 2016-07-18 DIAGNOSIS — S0993XA Unspecified injury of face, initial encounter: Secondary | ICD-10-CM | POA: Diagnosis not present

## 2016-07-18 DIAGNOSIS — R220 Localized swelling, mass and lump, head: Secondary | ICD-10-CM | POA: Diagnosis not present

## 2016-07-18 DIAGNOSIS — S0083XA Contusion of other part of head, initial encounter: Secondary | ICD-10-CM | POA: Diagnosis not present

## 2016-07-18 DIAGNOSIS — S0011XA Contusion of right eyelid and periocular area, initial encounter: Secondary | ICD-10-CM | POA: Diagnosis not present

## 2016-07-18 DIAGNOSIS — S40021A Contusion of right upper arm, initial encounter: Secondary | ICD-10-CM | POA: Diagnosis not present

## 2016-07-18 MED ORDER — HYDROCODONE-ACETAMINOPHEN 5-325 MG PO TABS
1.0000 | ORAL_TABLET | Freq: Once | ORAL | Status: AC
  Administered 2016-07-18: 1 via ORAL
  Filled 2016-07-18: qty 1

## 2016-07-18 NOTE — ED Triage Notes (Signed)
Pt sts she fell in bathtub on Saturday. Swelling noted to both eyes; pt with bruising to right arm as well

## 2016-07-18 NOTE — ED Provider Notes (Signed)
[facility] DEPT Provider Note   CSN: [id number] Arrival date & time: 07/18/16  0700     History   Chief Complaint Chief Complaint  Patient presents with  . Fall    HPI Alice [name] is a 46 y.o. female.  Pt comes in with c/o bruising and headache after falling in the bathtub 2 days ago. Denies loc. She states that she had a hematoma on her forehead and now it is around her eyes and her eyes feel like they can't move. She is on coumadin for history of pe. She also states that she has a bruise to her right arm that is sore.      Past Medical History:  Diagnosis Date  . Anemia   . Anxiety   . Chronic bronchitis ([facility])   . Chronic upper back pain   . Depression   . DVT (deep venous thrombosis) (HCC)    BLE  . GERD (gastroesophageal reflux disease)   . Headache    "weekly" (04/16/2015)  . Hypertension   . Migraine    "monthly" (04/16/2015)  . Obesity   . Pneumonia 2014  . Pulmonary embolism ([facility])   . Sinusitis nasal   . Sleep apnea    "I'm suppose to wear a mask but I don't" (04/16/2015)  . Varicose veins of right lower extremity     Patient Active Problem List   Diagnosis Date Noted  . 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 ([facility]) 07/05/2016  . Trichomonal infection 11/24/2015  . Abdominal pain 05/02/2015  . Symptomatic cholelithiasis 04/16/2015  . Acute bilateral upper abdominal pain 04/16/2015  . Anemia 05/18/2013  . Hypotension, unspecified 05/17/2013  . CKD (chronic kidney disease), stage III 05/17/2013  . Anal fissure 02/06/2013  . Anal skin tag 02/06/2013  . ALLERGIC RHINITIS 03/05/2010  . LOW BACK PAIN SYNDROME 12/13/2007  . WOUND INFECTION 10/10/2007  . OBESITY, MORBID 12/02/2006  . HTN (hypertension) 12/02/2006  . History of pulmonary embolism 12/02/2006  . History of DVT (deep vein thrombosis) 12/02/2006  . 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 GASTROESOPHAGEAL REFLUX DISEASE  12/02/2006  . OSA (obstructive sleep apnea) 12/02/2006    Past Surgical History:  Procedure Laterality Date  . CHOLECYSTECTOMY N/A 04/18/2015   Procedure: LAPAROSCOPIC CHOLECYSTECTOMY;  Surgeon: Ralene Ok, MD;  Location: [facility];  Service: General;  Laterality: N/A;  . HYSTEROSCOPY W/D&C  12/24/2001   Archie Endo 09/28/2010  . INCISION AND DRAINAGE Right 09/10/2008   leg:  skin and soft tissue and muscle/notes 09/15/2010  . INCISION AND DRAINAGE Right 01/01/2008   Chronic venous stasis insufficiency ulcer,/notes 09/14/2010/  . INCISION AND DRAINAGE Right 01/3809   calf w/application wound vac/notes 07/20/2010  . LAPAROSCOPIC GASTRIC BYPASS  ~ 2007  . TONSILLECTOMY      OB History    Gravida Para Term Preterm AB Living   0 0 0 0 0 0   SAB TAB Ectopic Multiple Live Births   0 0 0 0         Home Medications    Prior to Admission medications   Medication Sig Start Date End Date Taking? Authorizing Provider  albuterol (PROVENTIL HFA;VENTOLIN HFA) 108 (90 Base) MCG/ACT inhaler Inhale 3 puffs into the lungs every 6 (six) hours as needed for wheezing or shortness of breath.    Historical Provider, MD  amLODipine (NORVASC) 10 MG tablet Take 1 tablet (10 mg total) by mouth daily. 05/20/13   [name],  MD  azithromycin (ZITHROMAX) 250 MG tablet Take 4 tablets (1,000 mg total) by mouth once. To take in one week 7/12 11/18/15   Lezlie Lye, NP  buPROPion (WELLBUTRIN XL) 150 MG 24 hr tablet Take 150 mg by mouth daily.    Historical Provider, MD  cephALEXin (KEFLEX) 500 MG capsule Take 1 capsule (500 mg total) by mouth 3 (three) times daily. 07/07/16   [name], MD  Cholecalciferol (VITAMIN D3) 5000 units CAPS Take 1 capsule by mouth daily.     Historical Provider, MD  gabapentin (NEURONTIN) 300 MG capsule  04/13/16   Historical Provider, MD  hydrochlorothiazide (HYDRODIURIL) 25 MG tablet Take 25 mg by mouth daily.    Historical Provider, MD  ibuprofen (ADVIL,MOTRIN) 600 MG tablet Take 1  tablet (600 mg total) by mouth every 6 (six) hours as needed. 11/18/15   Lezlie Lye, NP  levonorgestrel (MIRENA) 20 MCG/24HR IUD 1 each by Intrauterine route once. Placed in 2013.    Historical Provider, MD  phenazopyridine (PYRIDIUM) 200 MG tablet Take 1 tablet (200 mg total) by mouth 3 (three) times daily. 11/18/15   Lezlie Lye, NP  phentermine (ADIPEX-P) 37.5 MG tablet TK 1 T PO  ONCE A DAY 06/24/16   Historical Provider, MD  phentermine 37.5 MG capsule Take 37.5 mg by mouth daily.    Historical Provider, MD  sulindac (CLINORIL) 200 MG tablet  06/09/16   Historical Provider, MD  warfarin (COUMADIN) 2.5 MG tablet Take 5 tablets (12.5 mg total) by mouth one time only at 6 PM. Take daily until INR checked and then per PCP. Patient taking differently: Take 10 mg by mouth daily.  05/20/13   [name], MD    Family History Family History  Problem Relation Age of Onset  . Kidney disease Mother     kidney transplant    Social History Social History  Substance Use Topics  . Smoking status: Never Smoker  . Smokeless tobacco: Never Used  . Alcohol use 2.4 oz/week    4 Glasses of wine per week     Allergies   Oxycodone hcl   Review of Systems Review of Systems  All other systems reviewed and are negative.    Physical Exam Updated Vital Signs BP 142/93 (BP Location: Left Arm)   Pulse 93   Temp 98.1 F (36.7 C) (Oral)   Resp 20   SpO2 98%   Physical Exam  Constitutional: She is oriented to person, place, and time. She appears well-developed and well-nourished.  HENT:  Hematoma noted to the forehead and around eyes bilaterally  Eyes: EOM are normal. Pupils are equal, round, and reactive to light.  Neck: Normal range of motion. Neck supple.  Cardiovascular: Normal rate.   Pulmonary/Chest: Effort normal.  Musculoskeletal: Normal range of motion.  Neurological: She is alert and oriented to person, place, and time.  Skin:  Bruise noted to the right upper arm. Full  rom of arm  Psychiatric: She has a normal mood and affect.  Nursing note and vitals reviewed.    ED Treatments / Results  Labs (all labs ordered are listed, but only abnormal results are displayed) Labs Reviewed - No data to display  EKG  EKG Interpretation None       Radiology Ct Head Wo Contrast  Result Date: 07/18/2016 CLINICAL DATA:  Fall 2 days ago in bathtub. Swelling to both eyes. Left headache. EXAM: CT HEAD WITHOUT CONTRAST CT MAXILLOFACIAL WITHOUT CONTRAST TECHNIQUE: Multidetector  CT imaging of the head and maxillofacial structures were performed using the standard protocol without intravenous contrast. Multiplanar CT image reconstructions of the maxillofacial structures were also generated. COMPARISON:  04/04/2014 FINDINGS: CT HEAD FINDINGS Brain: No acute intracranial abnormality. Specifically, no hemorrhage, hydrocephalus, mass lesion, acute infarction, or significant intracranial injury. Vascular: No hyperdense vessel or unexpected calcification. Skull: No acute calvarial abnormality. Other: In the forehead region. CT MAXILLOFACIAL FINDINGS Osseous: No facial or orbital fracture. Orbits: Mild soft tissue swelling over the orbits. Orbital soft tissues otherwise unremarkable. Globes are intact. Sinuses: Mucosal thickening within the maxillary sinuses bilaterally. No air-fluid levels. Mastoid air cells are clear. Soft tissues: Soft tissue swelling over the orbits and forehead. IMPRESSION: No acute intracranial abnormality. No orbital or facial fracture. Electronically Signed   By: Rolm Baptise M.D.   On: 07/18/2016 09:10   Ct Maxillofacial Wo Contrast  Result Date: 07/18/2016 CLINICAL DATA:  Fall 2 days ago in bathtub. Swelling to both eyes. Left headache. EXAM: CT HEAD WITHOUT CONTRAST CT MAXILLOFACIAL WITHOUT CONTRAST TECHNIQUE: Multidetector CT imaging of the head and maxillofacial structures were performed using the standard protocol without intravenous contrast. Multiplanar CT  image reconstructions of the maxillofacial structures were also generated. COMPARISON:  04/04/2014 FINDINGS: CT HEAD FINDINGS Brain: No acute intracranial abnormality. Specifically, no hemorrhage, hydrocephalus, mass lesion, acute infarction, or significant intracranial injury. Vascular: No hyperdense vessel or unexpected calcification. Skull: No acute calvarial abnormality. Other: In the forehead region. CT MAXILLOFACIAL FINDINGS Osseous: No facial or orbital fracture. Orbits: Mild soft tissue swelling over the orbits. Orbital soft tissues otherwise unremarkable. Globes are intact. Sinuses: Mucosal thickening within the maxillary sinuses bilaterally. No air-fluid levels. Mastoid air cells are clear. Soft tissues: Soft tissue swelling over the orbits and forehead. IMPRESSION: No acute intracranial abnormality. No orbital or facial fracture. Electronically Signed   By: Rolm Baptise M.D.   On: 07/18/2016 09:10    Procedures Procedures (including critical care time)  Medications Ordered in ED Medications - No data to display   Initial Impression / Assessment and Plan / ED Course  I have reviewed the triage vital signs and the nursing notes.  Pertinent labs & imaging results that were available during my care of the patient were reviewed by me and considered in my medical decision making (see chart for details).     No acute findings. Discussed supportive care at home with pt.   Final Clinical Impressions(s) / ED Diagnoses   Final diagnoses:  None    New Prescriptions New Prescriptions   No medications on file     Glendell Docker, NP 07/18/16 [place], MD 07/18/16 [phone]

## 2016-07-18 NOTE — ED Notes (Signed)
Spoke with CT they are sending someone now to get pt.

## 2016-07-19 ENCOUNTER — Encounter (INDEPENDENT_AMBULATORY_CARE_PROVIDER_SITE_OTHER): Payer: Self-pay | Admitting: Orthopedic Surgery

## 2016-07-19 ENCOUNTER — Ambulatory Visit (INDEPENDENT_AMBULATORY_CARE_PROVIDER_SITE_OTHER): Payer: Medicare Other | Admitting: Orthopedic Surgery

## 2016-07-19 DIAGNOSIS — I87331 Chronic venous hypertension (idiopathic) with ulcer and inflammation of right lower extremity: Secondary | ICD-10-CM | POA: Diagnosis not present

## 2016-07-19 DIAGNOSIS — L97919 Non-pressure chronic ulcer of unspecified part of right lower leg with unspecified severity: Principal | ICD-10-CM

## 2016-07-19 NOTE — Progress Notes (Signed)
Office Visit Note   Patient: Alice [name]           Date of Birth: 1971-02-13           MRN: [id number] Visit Date: 07/19/2016              Requested by: Benito Mccreedy, MD [address] [place], [place] [address] PCP: Benito Mccreedy, MD  Chief Complaint  Patient presents with  . Right Leg - Wound Check    HPI: Patient is 46 y.o female who presents for one week follow up right medial leg ulceration. She is wearing three compression wrap with silver alginate dressing. She complains of odor from wound. Maxcine Ham, RT    Assessment & Plan: Visit Diagnoses:  1. 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 (HCC)     Plan: Patient still has fibrinous exudate with significant drainage and pain. She is on Keflex. We will plan for surgical intervention patient wants to wait until next week we will wrap her leg with a Dynaflex wraps today plan for surgery Wednesday next week for irrigation and debridement placement of a vera flow wound VAC with return to the operating room on Friday for placement of split-thickness skin graft.  Follow-Up Instructions: Return in about 2 weeks (around 08/02/2016).   Ortho Exam Objective examination patient is alert oriented no adenopathy well-dressed normal affect normal respiratory effort she has an antalgic gait. Examination she has bruising on her forehead and underneath her eyes were recent fall that she was evaluated in the emergency room. Examination of right leg she has clear drainage there is no cellulitis there is tenderness to light touch there is fibrinous exudative tissue the wound is about 10 x 20 mm with about 5 mm depth. ROS: Complete review of systems negative except as mentioned in the history of present illness. Imaging: No results found.  Labs: Lab Results  Component Value Date   REPTSTATUS 05/17/2013 FINAL 05/17/2013   REPTSTATUS 05/17/2013 FINAL 05/17/2013   REPTSTATUS 05/19/2013 FINAL 05/17/2013   GRAMSTAIN  05/17/2013    MODERATE WBC PRESENT, PREDOMINANTLY MONONUCLEAR MODERATE GRAM POSITIVE COCCI IN PAIRS IN CHAINS RARE GRAM NEGATIVE RODS Gram Stain Report Called to,Read Back By and Verified With: HOPPER,M RN @ [phone] 05/17/13 LEONARD,A   GRAMSTAIN  05/17/2013    MODERATE WBC PRESENT, PREDOMINANTLY MONONUCLEAR MODERATE SQUAMOUS EPITHELIAL CELLS PRESENT MODERATE GRAM POSITIVE COCCI IN PAIRS IN CHAINS RARE GRAM NEGATIVE RODS Performed at [facility]  05/17/2013    NORMAL OROPHARYNGEAL FLORA Performed at [facility] 09/30/2007    Orders:  No orders of the defined types were placed in this encounter.  No orders of the defined types were placed in this encounter.    Procedures: No procedures performed  Clinical Data: No additional findings.  Subjective: Review of Systems  Objective: Vital Signs: LMP 06/20/2016 (Approximate)   Specialty Comments:  No specialty comments available.  PMFS History: Patient Active Problem List   Diagnosis Date Noted  . 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 ([facility]) 07/05/2016  . Trichomonal infection 11/24/2015  . Abdominal pain 05/02/2015  . Symptomatic cholelithiasis 04/16/2015  . Acute bilateral upper abdominal pain 04/16/2015  . Anemia 05/18/2013  . Hypotension, unspecified 05/17/2013  . CKD (chronic kidney disease), stage III 05/17/2013  . Anal fissure 02/06/2013  . Anal skin tag 02/06/2013  . ALLERGIC RHINITIS 03/05/2010  .  LOW BACK PAIN SYNDROME 12/13/2007  . WOUND INFECTION 10/10/2007  . OBESITY, MORBID 12/02/2006  . HTN (hypertension) 12/02/2006  . History of pulmonary embolism 12/02/2006  . History of DVT (deep vein thrombosis) 12/02/2006  . 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 GASTROESOPHAGEAL REFLUX DISEASE 12/02/2006  . OSA (obstructive sleep apnea)  12/02/2006   Past Medical History:  Diagnosis Date  . Anemia   . Anxiety   . Chronic bronchitis ([facility])   . Chronic upper back pain   . Depression   . DVT (deep venous thrombosis) (HCC)    BLE  . GERD (gastroesophageal reflux disease)   . Headache    "weekly" (04/16/2015)  . Hypertension   . Migraine    "monthly" (04/16/2015)  . Obesity   . Pneumonia 2014  . Pulmonary embolism ([facility])   . Sinusitis nasal   . Sleep apnea    "I'm suppose to wear a mask but I don't" (04/16/2015)  . Varicose veins of right lower extremity     Family History  Problem Relation Age of Onset  . Kidney disease Mother     kidney transplant    Past Surgical History:  Procedure Laterality Date  . CHOLECYSTECTOMY N/A 04/18/2015   Procedure: LAPAROSCOPIC CHOLECYSTECTOMY;  Surgeon: Ralene Ok, MD;  Location: [facility];  Service: General;  Laterality: N/A;  . HYSTEROSCOPY W/D&C  12/24/2001   Archie Endo 09/28/2010  . INCISION AND DRAINAGE Right 09/10/2008   leg:  skin and soft tissue and muscle/notes 09/15/2010  . INCISION AND DRAINAGE Right 01/01/2008   Chronic venous stasis insufficiency ulcer,/notes 09/14/2010/  . INCISION AND DRAINAGE Right 0/7680   calf w/application wound vac/notes 07/20/2010  . LAPAROSCOPIC GASTRIC BYPASS  ~ 2007  . TONSILLECTOMY     Social History   Occupational History  . Not on file.   Social History Main Topics  . Smoking status: Never Smoker  . Smokeless tobacco: Never Used  . Alcohol use 2.4 oz/week    4 Glasses of wine per week  . Drug use: No  . Sexual activity: Yes    Partners: Male    Birth control/ protection: IUD

## 2016-07-22 ENCOUNTER — Other Ambulatory Visit (INDEPENDENT_AMBULATORY_CARE_PROVIDER_SITE_OTHER): Payer: Self-pay | Admitting: Family

## 2016-07-22 ENCOUNTER — Encounter (HOSPITAL_COMMUNITY): Payer: Self-pay | Admitting: *Deleted

## 2016-07-22 MED ORDER — DEXTROSE 5 % IV SOLN
3.0000 g | INTRAVENOUS | Status: AC
  Administered 2016-07-25: 3 g via INTRAVENOUS
  Filled 2016-07-22 (×2): qty 3000

## 2016-07-22 NOTE — Progress Notes (Signed)
Pt denies SOB, chest pain, and being under the care of a cardiologist. Pt denies having a stress test and cardiac cath. Pt made aware to stop taking vitamins, Phentermine, fish oil and herbal medications. Left voice message with Malachy Mood, Surgical Coordinator to clarify Coumadin and NSAID pre-op instructions ( pt was not instructed to stop either) ; awaiting a call back. Pt denies having an EKG and chest x tray within the last year. Pt denies having recent labs.

## 2016-07-22 NOTE — Progress Notes (Signed)
Pt verbalized understanding of all pre-op instructions. 

## 2016-07-25 ENCOUNTER — Inpatient Hospital Stay (HOSPITAL_COMMUNITY): Payer: Medicare Other | Admitting: Anesthesiology

## 2016-07-25 ENCOUNTER — Encounter (HOSPITAL_COMMUNITY)
Admission: RE | Disposition: A | Discharge status: Home Health | Payer: Self-pay | Source: Ambulatory Visit | Attending: Orthopedic Surgery

## 2016-07-25 ENCOUNTER — Encounter (HOSPITAL_COMMUNITY): Payer: Self-pay | Admitting: Anesthesiology

## 2016-07-25 ENCOUNTER — Inpatient Hospital Stay (HOSPITAL_COMMUNITY)
Admission: RE | Admit: 2016-07-25 | Discharge: 2016-07-28 | DRG: 167 | Disposition: A | Discharge status: Home Health | Payer: Medicare Other | Source: Ambulatory Visit | Attending: Orthopedic Surgery | Admitting: Orthopedic Surgery

## 2016-07-25 DIAGNOSIS — F329 Major depressive disorder, single episode, unspecified: Secondary | ICD-10-CM | POA: Diagnosis present

## 2016-07-25 DIAGNOSIS — Z9049 Acquired absence of other specified parts of digestive tract: Secondary | ICD-10-CM | POA: Diagnosis not present

## 2016-07-25 DIAGNOSIS — G4733 Obstructive sleep apnea (adult) (pediatric): Secondary | ICD-10-CM | POA: Diagnosis not present

## 2016-07-25 DIAGNOSIS — N183 Chronic kidney disease, stage 3 (moderate): Secondary | ICD-10-CM | POA: Diagnosis not present

## 2016-07-25 DIAGNOSIS — I10 Essential (primary) hypertension: Secondary | ICD-10-CM | POA: Diagnosis present

## 2016-07-25 DIAGNOSIS — L97219 Non-pressure chronic ulcer of right calf with unspecified severity: Secondary | ICD-10-CM | POA: Diagnosis present

## 2016-07-25 DIAGNOSIS — L97519 Non-pressure chronic ulcer of other part of right foot with unspecified severity: Secondary | ICD-10-CM | POA: Diagnosis not present

## 2016-07-25 DIAGNOSIS — Z7901 Long term (current) use of anticoagulants: Secondary | ICD-10-CM | POA: Diagnosis not present

## 2016-07-25 DIAGNOSIS — Z841 Family history of disorders of kidney and ureter: Secondary | ICD-10-CM | POA: Diagnosis not present

## 2016-07-25 DIAGNOSIS — J42 Unspecified chronic bronchitis: Principal | ICD-10-CM | POA: Diagnosis present

## 2016-07-25 DIAGNOSIS — I129 Hypertensive chronic kidney disease with stage 1 through stage 4 chronic kidney disease, or unspecified chronic kidney disease: Secondary | ICD-10-CM | POA: Diagnosis not present

## 2016-07-25 DIAGNOSIS — I87331 Chronic venous hypertension (idiopathic) with ulcer and inflammation of right lower extremity: Secondary | ICD-10-CM | POA: Diagnosis not present

## 2016-07-25 DIAGNOSIS — Z885 Allergy status to narcotic agent status: Secondary | ICD-10-CM

## 2016-07-25 DIAGNOSIS — M545 Low back pain: Secondary | ICD-10-CM | POA: Diagnosis not present

## 2016-07-25 DIAGNOSIS — I872 Venous insufficiency (chronic) (peripheral): Secondary | ICD-10-CM | POA: Diagnosis not present

## 2016-07-25 DIAGNOSIS — K219 Gastro-esophageal reflux disease without esophagitis: Secondary | ICD-10-CM | POA: Diagnosis present

## 2016-07-25 DIAGNOSIS — Z6841 Body Mass Index (BMI) 40.0 and over, adult: Secondary | ICD-10-CM | POA: Diagnosis not present

## 2016-07-25 DIAGNOSIS — L98499 Non-pressure chronic ulcer of skin of other sites with unspecified severity: Secondary | ICD-10-CM | POA: Diagnosis not present

## 2016-07-25 DIAGNOSIS — L97919 Non-pressure chronic ulcer of unspecified part of right lower leg with unspecified severity: Secondary | ICD-10-CM | POA: Diagnosis present

## 2016-07-25 DIAGNOSIS — D649 Anemia, unspecified: Secondary | ICD-10-CM | POA: Diagnosis not present

## 2016-07-25 DIAGNOSIS — I83019 Varicose veins of right lower extremity with ulcer of unspecified site: Secondary | ICD-10-CM | POA: Diagnosis present

## 2016-07-25 DIAGNOSIS — E662 Morbid (severe) obesity with alveolar hypoventilation: Secondary | ICD-10-CM | POA: Diagnosis present

## 2016-07-25 DIAGNOSIS — I83208 Varicose veins of unspecified lower extremity with both ulcer of other part of lower extremity and inflammation: Secondary | ICD-10-CM | POA: Diagnosis not present

## 2016-07-25 HISTORY — PX: INCISION AND DRAINAGE: SHX5863

## 2016-07-25 HISTORY — PX: SKIN GRAFT SPLIT THICKNESS LEG / FOOT: SUR1303

## 2016-07-25 HISTORY — PX: I&D EXTREMITY: SHX5045

## 2016-07-25 HISTORY — DX: Other specified disorders of veins: I87.8

## 2016-07-25 LAB — BASIC METABOLIC PANEL
ANION GAP: 8 (ref 5–15)
BUN: 14 mg/dL (ref 6–20)
CHLORIDE: 102 mmol/L (ref 101–111)
CO2: 29 mmol/L (ref 22–32)
Calcium: 8.7 mg/dL — ABNORMAL LOW (ref 8.9–10.3)
Creatinine, Ser: 1.07 mg/dL — ABNORMAL HIGH (ref 0.44–1.00)
GFR calc Af Amer: >60 mL/min (ref >60)
GFR calc non Af Amer: >60 mL/min (ref >60)
Glucose, Bld: 88 mg/dL (ref 65–99)
POTASSIUM: 2.9 mmol/L — AB (ref 3.5–5.1)
SODIUM: 139 mmol/L (ref 135–145)

## 2016-07-25 LAB — CBC
HCT: 33.6 % — ABNORMAL LOW (ref 36.0–46.0)
HEMOGLOBIN: 11.2 g/dL — AB (ref 12.0–15.0)
MCH: 26.5 pg (ref 26.0–34.0)
MCHC: 33.3 g/dL (ref 30.0–36.0)
MCV: 79.4 fL (ref 78.0–100.0)
PLATELETS: 286 10*3/uL (ref 150–400)
RBC: 4.23 MIL/uL (ref 3.87–5.11)
RDW: 15.5 % (ref 11.5–15.5)
WBC: 7.2 10*3/uL (ref 4.0–10.5)

## 2016-07-25 LAB — HCG, SERUM, QUALITATIVE: PREG SERUM: NEGATIVE

## 2016-07-25 LAB — PROTIME-INR
INR: 3.22
PROTHROMBIN TIME: 33.7 s — AB (ref 11.4–15.2)

## 2016-07-25 SURGERY — IRRIGATION AND DEBRIDEMENT EXTREMITY
Anesthesia: General | Laterality: Right

## 2016-07-25 MED ORDER — KETOROLAC TROMETHAMINE 30 MG/ML IJ SOLN
30.0000 mg | Freq: Once | INTRAMUSCULAR | Status: AC
  Administered 2016-07-25: 30 mg via INTRAVENOUS

## 2016-07-25 MED ORDER — PHENTERMINE HCL 37.5 MG PO CAPS: 37.5000 mg | ORAL_CAPSULE | Freq: Every day | ORAL | Status: DC

## 2016-07-25 MED ORDER — LIDOCAINE 2% (20 MG/ML) 5 ML SYRINGE
INTRAMUSCULAR | Status: AC
  Filled 2016-07-25: qty 5

## 2016-07-25 MED ORDER — CHLORHEXIDINE GLUCONATE 4 % EX LIQD: 60.0000 mL | Freq: Once | CUTANEOUS | Status: DC

## 2016-07-25 MED ORDER — METOCLOPRAMIDE HCL 5 MG PO TABS: 5.0000 mg | ORAL_TABLET | Freq: Three times a day (TID) | ORAL | Status: DC | PRN

## 2016-07-25 MED ORDER — DOCUSATE SODIUM 100 MG PO CAPS
100.0000 mg | ORAL_CAPSULE | Freq: Two times a day (BID) | ORAL | Status: DC
  Administered 2016-07-25 – 2016-07-28 (×5): 100 mg via ORAL
  Filled 2016-07-25 (×6): qty 1

## 2016-07-25 MED ORDER — LIDOCAINE 2% (20 MG/ML) 5 ML SYRINGE
INTRAMUSCULAR | Status: DC | PRN
  Administered 2016-07-25: 100 mg via INTRAVENOUS

## 2016-07-25 MED ORDER — BISACODYL 10 MG RE SUPP: 10.0000 mg | Freq: Every day | RECTAL | Status: DC | PRN

## 2016-07-25 MED ORDER — 0.9 % SODIUM CHLORIDE (POUR BTL) OPTIME
TOPICAL | Status: DC | PRN
  Administered 2016-07-25: 1000 mL

## 2016-07-25 MED ORDER — ACETAMINOPHEN 650 MG RE SUPP: 650.0000 mg | Freq: Four times a day (QID) | RECTAL | Status: DC | PRN

## 2016-07-25 MED ORDER — ONDANSETRON HCL 4 MG PO TABS: 4.0000 mg | ORAL_TABLET | Freq: Four times a day (QID) | ORAL | Status: DC | PRN

## 2016-07-25 MED ORDER — PROMETHAZINE HCL 25 MG/ML IJ SOLN: 6.2500 mg | INTRAMUSCULAR | Status: DC | PRN

## 2016-07-25 MED ORDER — SODIUM CHLORIDE 0.9 % IR SOLN
Status: DC | PRN
  Administered 2016-07-25: 1000 mL

## 2016-07-25 MED ORDER — GABAPENTIN 300 MG PO CAPS
300.0000 mg | ORAL_CAPSULE | Freq: Every day | ORAL | Status: DC
  Administered 2016-07-25 – 2016-07-26 (×2): 300 mg via ORAL
  Filled 2016-07-25 (×3): qty 1

## 2016-07-25 MED ORDER — SODIUM CHLORIDE 0.9 % IV SOLN
INTRAVENOUS | Status: DC
  Administered 2016-07-25: 14:00:00 via INTRAVENOUS

## 2016-07-25 MED ORDER — MIDAZOLAM HCL 2 MG/2ML IJ SOLN
INTRAMUSCULAR | Status: AC
  Filled 2016-07-25: qty 2

## 2016-07-25 MED ORDER — MIDAZOLAM HCL 5 MG/5ML IJ SOLN
INTRAMUSCULAR | Status: DC | PRN
  Administered 2016-07-25: 2 mg via INTRAVENOUS

## 2016-07-25 MED ORDER — PROPOFOL 10 MG/ML IV BOLUS
INTRAVENOUS | Status: AC
  Filled 2016-07-25: qty 20

## 2016-07-25 MED ORDER — ONDANSETRON HCL 4 MG/2ML IJ SOLN
INTRAMUSCULAR | Status: AC
  Filled 2016-07-25: qty 2

## 2016-07-25 MED ORDER — BUPROPION HCL ER (XL) 150 MG PO TB24
150.0000 mg | ORAL_TABLET | Freq: Every day | ORAL | Status: DC
  Administered 2016-07-25 – 2016-07-27 (×3): 150 mg via ORAL
  Filled 2016-07-25 (×3): qty 1

## 2016-07-25 MED ORDER — FENTANYL CITRATE (PF) 100 MCG/2ML IJ SOLN
25.0000 ug | INTRAMUSCULAR | Status: DC | PRN
  Administered 2016-07-25 (×3): 50 ug via INTRAVENOUS

## 2016-07-25 MED ORDER — KETOROLAC TROMETHAMINE 15 MG/ML IJ SOLN
15.0000 mg | Freq: Four times a day (QID) | INTRAMUSCULAR | Status: AC
  Administered 2016-07-25 – 2016-07-26 (×4): 15 mg via INTRAVENOUS
  Filled 2016-07-25 (×4): qty 1

## 2016-07-25 MED ORDER — MEPERIDINE HCL 25 MG/ML IJ SOLN: 6.2500 mg | INTRAMUSCULAR | Status: DC | PRN

## 2016-07-25 MED ORDER — HYDROMORPHONE HCL 2 MG/ML IJ SOLN
1.0000 mg | INTRAMUSCULAR | Status: DC | PRN
  Administered 2016-07-25 – 2016-07-26 (×2): 1 mg via INTRAVENOUS
  Filled 2016-07-25 (×2): qty 1

## 2016-07-25 MED ORDER — METHOCARBAMOL 1000 MG/10ML IJ SOLN
500.0000 mg | Freq: Four times a day (QID) | INTRAMUSCULAR | Status: DC | PRN
  Filled 2016-07-25: qty 5

## 2016-07-25 MED ORDER — HYDROMORPHONE HCL 1 MG/ML IJ SOLN: 1.0000 mg | INTRAMUSCULAR | Status: DC | PRN

## 2016-07-25 MED ORDER — METOCLOPRAMIDE HCL 5 MG/ML IJ SOLN: 5.0000 mg | Freq: Three times a day (TID) | INTRAMUSCULAR | Status: DC | PRN

## 2016-07-25 MED ORDER — AMLODIPINE BESYLATE 10 MG PO TABS
10.0000 mg | ORAL_TABLET | Freq: Every day | ORAL | Status: DC
  Administered 2016-07-25 – 2016-07-27 (×3): 10 mg via ORAL
  Filled 2016-07-25 (×3): qty 1

## 2016-07-25 MED ORDER — PHENAZOPYRIDINE HCL 200 MG PO TABS: 200.0000 mg | ORAL_TABLET | Freq: Three times a day (TID) | ORAL | Status: DC

## 2016-07-25 MED ORDER — FENTANYL CITRATE (PF) 100 MCG/2ML IJ SOLN
INTRAMUSCULAR | Status: AC
  Filled 2016-07-25: qty 2

## 2016-07-25 MED ORDER — WARFARIN - PHARMACIST DOSING INPATIENT: Freq: Every day | Status: DC

## 2016-07-25 MED ORDER — TRANEXAMIC ACID 1000 MG/10ML IV SOLN
2000.0000 mg | INTRAVENOUS | Status: AC
  Administered 2016-07-25: 2000 mg via TOPICAL
  Filled 2016-07-25: qty 20

## 2016-07-25 MED ORDER — FENTANYL CITRATE (PF) 100 MCG/2ML IJ SOLN
INTRAMUSCULAR | Status: DC | PRN
  Administered 2016-07-25 (×2): 50 ug via INTRAVENOUS

## 2016-07-25 MED ORDER — CEFAZOLIN SODIUM-DEXTROSE 2-4 GM/100ML-% IV SOLN: 2.0000 g | INTRAVENOUS | Status: DC

## 2016-07-25 MED ORDER — ONDANSETRON HCL 4 MG/2ML IJ SOLN
4.0000 mg | Freq: Four times a day (QID) | INTRAMUSCULAR | Status: DC | PRN
  Administered 2016-07-25 – 2016-07-26 (×2): 4 mg via INTRAVENOUS
  Filled 2016-07-25 (×2): qty 2

## 2016-07-25 MED ORDER — WARFARIN SODIUM 6 MG PO TABS
6.0000 mg | ORAL_TABLET | Freq: Once | ORAL | Status: AC
  Administered 2016-07-25: 6 mg via ORAL
  Filled 2016-07-25: qty 1

## 2016-07-25 MED ORDER — SULINDAC 200 MG PO TABS
200.0000 mg | ORAL_TABLET | Freq: Three times a day (TID) | ORAL | Status: DC
  Filled 2016-07-25: qty 1

## 2016-07-25 MED ORDER — WARFARIN SODIUM 2.5 MG PO TABS: 12.5000 mg | ORAL_TABLET | Freq: Once | ORAL | Status: DC

## 2016-07-25 MED ORDER — PROPOFOL 10 MG/ML IV BOLUS
INTRAVENOUS | Status: DC | PRN
  Administered 2016-07-25: 200 mg via INTRAVENOUS

## 2016-07-25 MED ORDER — SUCCINYLCHOLINE CHLORIDE 200 MG/10ML IV SOSY
PREFILLED_SYRINGE | INTRAVENOUS | Status: AC
  Filled 2016-07-25: qty 10

## 2016-07-25 MED ORDER — KETOROLAC TROMETHAMINE 30 MG/ML IJ SOLN
INTRAMUSCULAR | Status: AC
  Filled 2016-07-25: qty 1

## 2016-07-25 MED ORDER — POLYETHYLENE GLYCOL 3350 17 G PO PACK: 17.0000 g | PACK | Freq: Every day | ORAL | Status: DC | PRN

## 2016-07-25 MED ORDER — SUCCINYLCHOLINE CHLORIDE 20 MG/ML IJ SOLN
INTRAMUSCULAR | Status: DC | PRN
  Administered 2016-07-25: 140 mg via INTRAVENOUS

## 2016-07-25 MED ORDER — HYDROCHLOROTHIAZIDE 25 MG PO TABS
25.0000 mg | ORAL_TABLET | Freq: Every day | ORAL | Status: DC
  Administered 2016-07-25 – 2016-07-27 (×3): 25 mg via ORAL
  Filled 2016-07-25 (×3): qty 1

## 2016-07-25 MED ORDER — LACTATED RINGERS IV SOLN
INTRAVENOUS | Status: DC
  Administered 2016-07-25: 50 mL/h via INTRAVENOUS

## 2016-07-25 MED ORDER — SODIUM CHLORIDE 0.9 % IR SOLN
Status: DC | PRN
  Administered 2016-07-25: 3000 mL

## 2016-07-25 MED ORDER — ONDANSETRON HCL 4 MG/2ML IJ SOLN
INTRAMUSCULAR | Status: DC | PRN
  Administered 2016-07-25: 4 mg via INTRAVENOUS

## 2016-07-25 MED ORDER — METHOCARBAMOL 500 MG PO TABS
ORAL_TABLET | ORAL | Status: AC
  Filled 2016-07-25: qty 1

## 2016-07-25 MED ORDER — WARFARIN SODIUM 7.5 MG PO TABS: 7.5000 mg | ORAL_TABLET | Freq: Once | ORAL | Status: DC

## 2016-07-25 MED ORDER — MAGNESIUM CITRATE PO SOLN: 1.0000 | Freq: Once | ORAL | Status: DC | PRN

## 2016-07-25 MED ORDER — METHOCARBAMOL 500 MG PO TABS
500.0000 mg | ORAL_TABLET | Freq: Four times a day (QID) | ORAL | Status: DC | PRN
  Administered 2016-07-25: 500 mg via ORAL

## 2016-07-25 MED ORDER — ACETAMINOPHEN 325 MG PO TABS: 650.0000 mg | ORAL_TABLET | Freq: Four times a day (QID) | ORAL | Status: DC | PRN

## 2016-07-25 MED ORDER — CEFAZOLIN SODIUM-DEXTROSE 2-4 GM/100ML-% IV SOLN
2.0000 g | Freq: Four times a day (QID) | INTRAVENOUS | Status: AC
  Administered 2016-07-25 – 2016-07-26 (×3): 2 g via INTRAVENOUS
  Filled 2016-07-25 (×3): qty 100

## 2016-07-25 MED ORDER — ALBUTEROL SULFATE (2.5 MG/3ML) 0.083% IN NEBU: 2.5000 mg | INHALATION_SOLUTION | Freq: Four times a day (QID) | RESPIRATORY_TRACT | Status: DC | PRN

## 2016-07-25 MED ORDER — LEVONORGESTREL 20 MCG/24HR IU IUD: 1.0000 | INTRAUTERINE_SYSTEM | Freq: Once | INTRAUTERINE | Status: DC

## 2016-07-25 SURGICAL SUPPLY — 34 items
BAG DECANTER FOR FLEXI CONT (MISCELLANEOUS) ×2 IMPLANT
BLADE SURG 21 STRL SS (BLADE) ×2 IMPLANT
BNDG COHESIVE 6X5 TAN STRL LF (GAUZE/BANDAGES/DRESSINGS) IMPLANT
BNDG GAUZE ELAST 4 BULKY (GAUZE/BANDAGES/DRESSINGS) ×2 IMPLANT
CANISTER WOUND CARE 500ML ATS (WOUND CARE) ×1 IMPLANT
COVER SURGICAL LIGHT HANDLE (MISCELLANEOUS) ×3 IMPLANT
DRAPE INCISE IOBAN 66X45 STRL (DRAPES) ×6 IMPLANT
DRAPE U-SHAPE 47X51 STRL (DRAPES) ×2 IMPLANT
DRESSING VERAFLO CLEANSE CC (GAUZE/BANDAGES/DRESSINGS) IMPLANT
DRSG ADAPTIC 3X8 NADH LF (GAUZE/BANDAGES/DRESSINGS) ×1 IMPLANT
DRSG VERAFLO CLEANSE CC (GAUZE/BANDAGES/DRESSINGS) ×2
DURAPREP 26ML APPLICATOR (WOUND CARE) ×2 IMPLANT
ELECT REM PT RETURN 9FT ADLT (ELECTROSURGICAL) ×2
ELECTRODE REM PT RTRN 9FT ADLT (ELECTROSURGICAL) ×1 IMPLANT
GAUZE SPONGE 4X4 12PLY STRL (GAUZE/BANDAGES/DRESSINGS) ×1 IMPLANT
GLOVE BIOGEL PI IND STRL 9 (GLOVE) ×1 IMPLANT
GLOVE BIOGEL PI INDICATOR 9 (GLOVE) ×1
GLOVE SURG ORTHO 9.0 STRL STRW (GLOVE) ×2 IMPLANT
GOWN STRL REUS W/ TWL XL LVL3 (GOWN DISPOSABLE) ×2 IMPLANT
GOWN STRL REUS W/TWL XL LVL3 (GOWN DISPOSABLE) ×4
HANDPIECE INTERPULSE COAX TIP (DISPOSABLE) ×2
KIT BASIN OR (CUSTOM PROCEDURE TRAY) ×2 IMPLANT
KIT ROOM TURNOVER OR (KITS) ×2 IMPLANT
MANIFOLD NEPTUNE II (INSTRUMENTS) ×2 IMPLANT
NS IRRIG 1000ML POUR BTL (IV SOLUTION) ×2 IMPLANT
PACK ORTHO EXTREMITY (CUSTOM PROCEDURE TRAY) ×2 IMPLANT
PAD ARMBOARD 7.5X6 YLW CONV (MISCELLANEOUS) ×4 IMPLANT
SET HNDPC FAN SPRY TIP SCT (DISPOSABLE) IMPLANT
STOCKINETTE IMPERVIOUS 9X36 MD (GAUZE/BANDAGES/DRESSINGS) ×1 IMPLANT
SWAB COLLECTION DEVICE MRSA (MISCELLANEOUS) IMPLANT
TOWEL OR 17X26 10 PK STRL BLUE (TOWEL DISPOSABLE) ×2 IMPLANT
TUBE ANAEROBIC SPECIMEN COL (MISCELLANEOUS) IMPLANT
TUBE CONNECTING 12X1/4 (SUCTIONS) ×2 IMPLANT
YANKAUER SUCT BULB TIP NO VENT (SUCTIONS) ×2 IMPLANT

## 2016-07-25 NOTE — Anesthesia Preprocedure Evaluation (Addendum)
Anesthesia Evaluation  Patient identified by MRN, date of birth, ID band Patient awake    Reviewed: Allergy & Precautions, NPO status , Patient's Chart, lab work & pertinent test results  Airway Mallampati: III  TM Distance: <3 FB Neck ROM: Full    Dental  (+) Partial Upper, Dental Advisory Given   Pulmonary sleep apnea , PE Untreated OSA   Pulmonary exam normal breath sounds clear to auscultation       Cardiovascular hypertension, Pt. on medications + Peripheral Vascular Disease  Normal cardiovascular exam Rhythm:Regular Rate:Normal     Neuro/Psych negative neurological ROS  negative psych ROS   GI/Hepatic Neg liver ROS,   Endo/Other  Morbid obesity  Renal/GU Renal InsufficiencyRenal disease  negative genitourinary   Musculoskeletal negative musculoskeletal ROS (+)   Abdominal (+) + obese,   Peds negative pediatric ROS (+)  Hematology Chronic anticoagulation   Anesthesia Other Findings   Reproductive/Obstetrics negative OB ROS                            Anesthesia Physical  Anesthesia Plan  ASA: III  Anesthesia Plan: General   Post-op Pain Management:    Induction: Intravenous  Airway Management Planned: Oral ETT  Additional Equipment:   Intra-op Plan:   Post-operative Plan: Extubation in OR  Informed Consent: I have reviewed the patients History and Physical, chart, labs and discussed the procedure including the risks, benefits and alternatives for the proposed anesthesia with the patient or authorized representative who has indicated his/her understanding and acceptance.   Dental advisory given  Plan Discussed with: CRNA and Surgeon  Anesthesia Plan Comments:         Anesthesia Quick Evaluation

## 2016-07-25 NOTE — Evaluation (Signed)
Physical Therapy Evaluation Patient Details Name: Alice [name] MRN: [id number] DOB: [birth year] Today's Date: 07/25/2016   History of Present Illness  Pt is a 46 yo woman with chronic venous ulcer on R calf. s/p I &D on 07/25/16 PMH significant for Anemia, Chronic Bronchitis, DVT, PE, HTN, and back pain  Clinical Impression  Patient is s/p above surgery resulting in functional limitations due to the deficits listed below (see PT Problem List). Pt is supervision for bed mobility and sit to stand and stand pivot transfer to [facility] and back. Patient will benefit from skilled PT to increase their independence and safety with mobility to allow discharge to the venue listed below.       Follow Up Recommendations      Equipment Recommendations  Rolling walker with 5" wheels    Recommendations for Other Services       Precautions / Restrictions Precautions Required Braces or Orthoses:  (Wound Vac L LE) Restrictions Weight Bearing Restrictions: Yes RLE Weight Bearing: Weight bearing as tolerated      Mobility  Bed Mobility Overal bed mobility: Needs Assistance Bed Mobility: Sidelying to Sit   Sidelying to sit: Supervision       General bed mobility comments: Pt able to come EoB with increased time and PT to manage wound vac  Transfers Overall transfer level: Needs assistance   Transfers: Stand Pivot Transfers;Sit to/from Stand Sit to Stand: Min guard (to RW) Stand pivot transfers: Supervision (to [facility])       General transfer comment: Pt slow with transfers and performs with flexed forward posture       Balance Overall balance assessment: Needs assistance Sitting-balance support: Feet supported Sitting balance-Leahy Scale: Good     Standing balance support: Bilateral upper extremity supported Standing balance-Leahy Scale: Fair Standing balance comment: utilizes UE support on RW                             Pertinent Vitals/Pain Pain Assessment: Faces Pain  Score: 0-No pain Faces Pain Scale: Hurts little more Pain Location: R calf Pain Descriptors / Indicators: Burning  VSS    Home Living Family/patient expects to be discharged to:: Private residence Living Arrangements: Alone Available Help at Discharge: Family Type of Home: Apartment Home Access: Stairs to enter Entrance Stairs-Rails: Left Entrance Stairs-Number of Steps: 3 Home Layout: One level Home Equipment: None      Prior Function Level of Independence: Independent                  Extremity/Trunk Assessment   Upper Extremity Assessment Upper Extremity Assessment: Defer to OT evaluation    Lower Extremity Assessment Lower Extremity Assessment: RLE deficits/detail RLE Deficits / Details: painful Venous ulcer I&D decreased AROM on R knee and ankle RLE: Unable to fully assess due to pain       Communication   Communication: No difficulties  Cognition Arousal/Alertness: Lethargic;Suspect due to medications Behavior During Therapy: Flat affect Overall Cognitive Status: Within Functional Limits for tasks assessed                             Assessment/Plan    PT Assessment Patient needs continued PT services  PT Problem List Decreased activity tolerance;Decreased mobility;Decreased knowledge of use of DME;Pain;Obesity;Decreased range of motion       PT Treatment Interventions Gait training;Therapeutic activities;Therapeutic exercise;DME instruction;Functional mobility training    PT Goals (  Current goals can be found in the Care Plan section)  Acute Rehab PT Goals Patient Stated Goal: go home PT Goal Formulation: With patient Time For Goal Achievement: 08/01/16 Potential to Achieve Goals: Good    Frequency Min 3X/week   Barriers to discharge           End of Session Equipment Utilized During Treatment: Gait belt   Patient left: in bed;with call bell/phone within reach;with family/visitor present Nurse Communication: Mobility  status PT Visit Diagnosis: Other abnormalities of gait and mobility (R26.89);Pain Pain - Right/Left: Right Pain - part of body: Leg         Time: 7517-0017 PT Time Calculation (min) (ACUTE ONLY): 43 min   Charges:   PT Evaluation $PT Eval Low Complexity: 1 Procedure PT Treatments $Therapeutic Activity: 23-37 mins   PT G Codes:         Bailey Mech Fleet 07/25/2016, 5:03 PM  Dani Gobble. Migdalia Dk PT, DPT Acute Rehabilitation  [phone] Pager [phone]

## 2016-07-25 NOTE — Transfer of Care (Signed)
Immediate Anesthesia Transfer of Care Note  Patient: Alice [name]  Procedure(s) Performed: Procedure(s): IRRIGATION AND DEBRIDEMENT RIGHT LEG ULCER, APPLY VERAFLO VAC (Right)  Patient Location: PACU  Anesthesia Type:General  Level of Consciousness: awake, alert  and oriented  Airway & Oxygen Therapy: Patient Spontanous Breathing and Patient connected to nasal cannula oxygen  Post-op Assessment: Report given to RN, Post -op Vital signs reviewed and stable and Patient moving all extremities X 4  Post vital signs: Reviewed and stable  Last Vitals:  Vitals:   07/25/16 0948 07/25/16 1255  BP: (!) 141/86   Pulse: 85   Resp: 18   Temp: 36.8 C 36.8 C    Last Pain:  Vitals:   07/25/16 1012  TempSrc:   PainSc: 9       Patients Stated Pain Goal: 4 (58/09/98 3382)  Complications: No apparent anesthesia complications

## 2016-07-25 NOTE — H&P (Signed)
Alice [name] is an 46 y.o. female.   Chief Complaint: Massive chronic venous ulcer right leg HPI: Patient is a 46 year old woman who has had recurrent venous ulcers to both legs now with a massive ulcer on the right leg she has undergone compression wraps without much improvement.  Past Medical History:  Diagnosis Date  . Anemia   . Anxiety   . Chronic bronchitis ([place])   . Chronic upper back pain   . Depression   . DVT (deep venous thrombosis) ([facility])    BLE  . GERD (gastroesophageal reflux disease)   . Headache    "weekly" (04/16/2015)  . Hypertension   . Migraine    "monthly" (04/16/2015)  . Obesity   . Pneumonia 2014  . Pulmonary embolism ([place])   . Sinusitis nasal   . Sleep apnea    "I'm suppose to wear a mask but I don't" (04/16/2015)  . Varicose veins of right lower extremity   . Venous stasis of lower extremity    right    Past Surgical History:  Procedure Laterality Date  . CHOLECYSTECTOMY N/A 04/18/2015   Procedure: LAPAROSCOPIC CHOLECYSTECTOMY;  Surgeon: Ralene Ok, MD;  Location: [facility];  Service: General;  Laterality: N/A;  . HYSTEROSCOPY W/D&C  12/24/2001   Archie Endo 09/28/2010  . INCISION AND DRAINAGE Right 09/10/2008   leg:  skin and soft tissue and muscle/notes 09/15/2010  . INCISION AND DRAINAGE Right 01/01/2008   Chronic venous stasis insufficiency ulcer,/notes 09/14/2010/  . INCISION AND DRAINAGE Right 07/4285   calf w/application wound vac/notes 07/20/2010  . LAPAROSCOPIC GASTRIC BYPASS  ~ 2007  . TONSILLECTOMY      Family History  Problem Relation Age of Onset  . Kidney disease Mother     kidney transplant   Social History:  reports that she has never smoked. She has never used smokeless tobacco. She reports that she does not drink alcohol or use drugs.  Allergies:  Allergies  Allergen Reactions  . Oxycodone Hcl Nausea Only  . Vicodin [Hydrocodone-Acetaminophen] Nausea And Vomiting    Medications Prior to Admission  Medication Sig Dispense Refill  .  albuterol (PROVENTIL HFA;VENTOLIN HFA) 108 (90 Base) MCG/ACT inhaler Inhale 3 puffs into the lungs every 6 (six) hours as needed for wheezing or shortness of breath.    Marland Kitchen amLODipine (NORVASC) 10 MG tablet Take 1 tablet (10 mg total) by mouth daily. 31 tablet 0  . buPROPion (WELLBUTRIN XL) 150 MG 24 hr tablet Take 150 mg by mouth daily.    . Cholecalciferol (VITAMIN D3) 5000 units CAPS Take 1 capsule by mouth daily.     Marland Kitchen gabapentin (NEURONTIN) 300 MG capsule Take 300 mg by mouth at bedtime.     . hydrochlorothiazide (HYDRODIURIL) 25 MG tablet Take 25 mg by mouth daily.    Marland Kitchen ibuprofen (ADVIL,MOTRIN) 600 MG tablet Take 1 tablet (600 mg total) by mouth every 6 (six) hours as needed. 30 tablet 0  . levonorgestrel (MIRENA) 20 MCG/24HR IUD 1 each by Intrauterine route once. Placed in 2013.    Marland Kitchen phentermine (ADIPEX-P) 37.5 MG tablet TK 1 T PO  ONCE A DAY  2  . phentermine 37.5 MG capsule Take 37.5 mg by mouth daily.    . sulindac (CLINORIL) 200 MG tablet Take 200 mg by mouth 3 (three) times daily.     Marland Kitchen warfarin (COUMADIN) 2.5 MG tablet Take 5 tablets (12.5 mg total) by mouth one time only at 6 PM. Take daily until INR checked and  then per PCP. (Patient taking differently: Take 10 mg by mouth daily. ) 30 tablet 0  . phenazopyridine (PYRIDIUM) 200 MG tablet Take 1 tablet (200 mg total) by mouth 3 (three) times daily. 6 tablet 0    Results for orders placed or performed during the hospital encounter of 07/25/16 (from the past 48 hour(s))  CBC     Status: Abnormal   Collection Time: 07/25/16  9:57 AM  Result Value Ref Range   WBC 7.2 4.0 - 10.5 K/uL   RBC 4.23 3.87 - 5.11 MIL/uL   Hemoglobin 11.2 (L) 12.0 - 15.0 g/dL   HCT 33.6 (L) 36.0 - 46.0 %   MCV 79.4 78.0 - 100.0 fL   MCH 26.5 26.0 - 34.0 pg   MCHC 33.3 30.0 - 36.0 g/dL   RDW 15.5 11.5 - 15.5 %   Platelets 286 150 - 400 K/uL  Basic metabolic panel     Status: Abnormal   Collection Time: 07/25/16  9:57 AM  Result Value Ref Range   Sodium  139 135 - 145 mmol/L   Potassium 2.9 (L) 3.5 - 5.1 mmol/L   Chloride 102 101 - 111 mmol/L   CO2 29 22 - 32 mmol/L   Glucose, Bld 88 65 - 99 mg/dL   BUN 14 6 - 20 mg/dL   Creatinine, Ser 1.07 (H) 0.44 - 1.00 mg/dL   Calcium 8.7 (L) 8.9 - 10.3 mg/dL   GFR calc non Af Amer >60 >60 mL/min   GFR calc Af Amer >60 >60 mL/min    Comment: (NOTE) The eGFR has been calculated using the CKD EPI equation. This calculation has not been validated in all clinical situations. eGFR's persistently <60 mL/min signify possible Chronic Kidney Disease.    Anion gap 8 5 - 15  Protime-INR     Status: Abnormal   Collection Time: 07/25/16  9:57 AM  Result Value Ref Range   Prothrombin Time 33.7 (H) 11.4 - 15.2 seconds   INR 3.22    No results found.  Review of Systems  All other systems reviewed and are negative.   Blood pressure (!) 141/86, pulse 85, temperature 98.3 F (36.8 C), temperature source Oral, resp. rate 18, height _0  (1.727 m), weight (!) 340 lb (154.2 kg), last menstrual period 07/11/2016, SpO2 100 %. Physical Exam  On examination patient is alert oriented no adenopathy well-dressed normal affect normal respiratory effort she does have an antalgic gait. Patient is a massive venous ulcer medial aspect right calf there is fibrinous necrotic tissue. Her ulcer is painful to palpation. Assessment/Plan  Assessment: Chronic venous ulcer right calf.  Plan: We will plan for debridement of the ulcer placement of an instillation wound VAC and then return to surgery in 2 days for placement of split-thickness skin graft and a wound VAC. Risk and benefits were discussed including infection neurovascular injury nonhealing the wound need for additional surgery.  [name], MD 07/25/2016, 11:00 AM

## 2016-07-25 NOTE — Anesthesia Procedure Notes (Signed)
Procedure Name: Intubation Date/Time: 07/25/2016 12:12 PM Performed by: Rush Farmer E Pre-anesthesia Checklist: Patient identified, Emergency Drugs available, Suction available and Patient being monitored Patient Re-evaluated:Patient Re-evaluated prior to inductionOxygen Delivery Method: Circle system utilized Preoxygenation: Pre-oxygenation with 100% oxygen Intubation Type: IV induction Ventilation: Mask ventilation without difficulty Laryngoscope Size: Mac and 4 Grade View: Grade I Tube type: Oral Tube size: 7.0 mm Number of attempts: 1 Airway Equipment and Method: Stylet Placement Confirmation: ETT inserted through vocal cords under direct vision,  positive ETCO2 and breath sounds checked- equal and bilateral Secured at: 21 cm Tube secured with: Tape Dental Injury: Teeth and Oropharynx as per pre-operative assessment

## 2016-07-25 NOTE — Op Note (Signed)
07/25/2016  12:58 PM  PATIENT:  Alice [name]    PRE-OPERATIVE DIAGNOSIS:  Venous Stasis Insufficiency Ulcer Right Leg  POST-OPERATIVE DIAGNOSIS:  Same  PROCEDURE:  IRRIGATION AND DEBRIDEMENT RIGHT LEG ULCER, APPLY VERAFLO VAC  SURGEON:  [name], MD  PHYSICIAN ASSISTANT:None ANESTHESIA:   General  PREOPERATIVE INDICATIONS:  Alice [name] is a  46 y.o. female with a diagnosis of Venous Stasis Insufficiency Ulcer Right Leg who failed conservative measures and elected for surgical management.    The risks benefits and alternatives were discussed with the patient preoperatively including but not limited to the risks of infection, bleeding, nerve injury, cardiopulmonary complications, the need for revision surgery, among others, and the patient was willing to proceed.  OPERATIVE IMPLANTS: TXA 2 g, wound VAC  OPERATIVE FINDINGS: Wound 10 x 20 cm  OPERATIVE PROCEDURE: Patient was brought to the operating room and underwent a general anesthetic. After adequate levels anesthesia obtained patient's right lower extremity was prepped using DuraPrep draped into a sterile field a timeout was called. The wound measures approximately 10 x 20 cm. Using a 21 blade knife the skin soft tissue and fascia was excised back to bleeding viable granulation tissue. This was irrigated with pulsatile lavage for 3 L. This was then held with compression with TXA for 3 minutes. A vera flow wound VAC was then applied with 20 mm of irrigation. This had a good suction fit. Patient was extubated taken to the PACU in stable condition.

## 2016-07-25 NOTE — Progress Notes (Addendum)
ANTICOAGULATION CONSULT NOTE - Initial Consult  Pharmacy Consult:  Coumadin Indication:  History of PE/DVT  Allergies  Allergen Reactions  . Oxycodone Hcl Nausea Only  . Vicodin [Hydrocodone-Acetaminophen] Nausea And Vomiting    Patient Measurements: Height: 5\' 8"  (172.7 cm) Weight: (!) 340 lb (154.2 kg) IBW/kg (Calculated) : 63.9  Vital Signs: Temp: 97.8 F (36.6 C) (03/12 1400) Temp Source: Oral (03/12 0948) BP: 140/76 (03/12 1400) Pulse Rate: 85 (03/12 0948)  Labs:  Recent Labs  07/25/16 0957  HGB 11.2*  HCT 33.6*  PLT 286  LABPROT 33.7*  INR 3.22  CREATININE 1.07*    Estimated Creatinine Clearance: 105.9 mL/min (by C-G formula based on SCr of 1.07 mg/dL (H)).   Medical History: Past Medical History:  Diagnosis Date  . Anemia   . Anxiety   . Chronic bronchitis ([place])   . Chronic upper back pain   . Depression   . DVT (deep venous thrombosis) ([facility])    BLE  . GERD (gastroesophageal reflux disease)   . Headache    "weekly" (04/16/2015)  . Hypertension   . Migraine    "monthly" (04/16/2015)  . Obesity   . Pneumonia 2014  . Pulmonary embolism ([place])   . Sinusitis nasal   . Sleep apnea    "I'm suppose to wear a mask but I don't" (04/16/2015)  . Varicose veins of right lower extremity   . Venous stasis of lower extremity    right     Assessment: 4 YOF presented for I&D of right leg ulcer.  Pharmacy consulted to continue Coumadin from PTA for history of PE/DVT.  INR supra-therapeutic in admit; no bleeding reported.  Patient stated that she takes 10mg  daily and hasn't missed any doses.   Goal of Therapy:  INR 2-3    Plan:  - Coumadin 6mg  PO today - Daily PT / INR - Monitor closely for s/sx of bleeding (also on Toradol)    Hiren Peplinski D. Mina Marble, PharmD, BCPS Pager:  [phone] 07/25/2016, 3:07 PM

## 2016-07-26 ENCOUNTER — Other Ambulatory Visit: Payer: Self-pay

## 2016-07-26 ENCOUNTER — Encounter (HOSPITAL_COMMUNITY): Payer: Self-pay | Admitting: Orthopedic Surgery

## 2016-07-26 LAB — PROTIME-INR
INR: 3.77
Prothrombin Time: 38.1 seconds — ABNORMAL HIGH (ref 11.4–15.2)

## 2016-07-26 NOTE — Anesthesia Postprocedure Evaluation (Addendum)
Anesthesia Post Note  Patient: Alice [name]  Procedure(s) Performed: Procedure(s) (LRB): IRRIGATION AND DEBRIDEMENT RIGHT LEG ULCER, APPLY VERAFLO VAC (Right)  Patient location during evaluation: PACU Anesthesia Type: General Level of consciousness: awake Pain management: pain level controlled Vital Signs Assessment: post-procedure vital signs reviewed and stable Respiratory status: spontaneous breathing Cardiovascular status: stable Postop Assessment: no signs of nausea or vomiting Anesthetic complications: no        Last Vitals:  Vitals:   07/25/16 2100 07/26/16 0300  BP: 117/67 125/64  Pulse: 75 (!) 56  Resp: 16   Temp: 36.7 C 36.7 C    Last Pain:  Vitals:   07/26/16 0300  TempSrc: Oral  PainSc:    Pain Goal: Patients Stated Pain Goal: 4 (07/25/16 1012)               [place]

## 2016-07-26 NOTE — Progress Notes (Signed)
Physical Therapy Treatment Patient Details Name: Alice [name] MRN: [id number] DOB: 1971-05-10 Today's Date: 07/26/2016    History of Present Illness Pt is a 46 yo woman with chronic venous ulcer on R calf. s/p I &D on 07/25/16 PMH significant for Anemia, Chronic Bronchitis, DVT, PE, HTN, and back pain    PT Comments    Pt seated in recliner and request BSC. Pt with minor wound leaking around seal in seated. Pt able to stand pivot transfer to [facility] with min guard assist. Pt able to sit<>stand transfer with RW and min guard assist and ambulate to bed 5 feet with min guard assist. Pt supervision for sit to supine in the bed. Pt wound with more leakage by end on session. Pt leg elevated above heart and placed on towels and nursing notified. Pt requires continued skilled PT to progress gait and improve strength and endurance to safely return to her home environment.     Follow Up Recommendations  Home health PT;Supervision/Assistance - 24 hour     Equipment Recommendations  Rolling walker with 5" wheels    Recommendations for Other Services       Precautions / Restrictions Precautions Required Braces or Orthoses:  (Wound Vac L LE) Restrictions Weight Bearing Restrictions: Yes RLE Weight Bearing: Weight bearing as tolerated    Mobility  Bed Mobility Overal bed mobility: Modified Independent Bed Mobility: Sit to Supine       Sit to supine: Supervision   General bed mobility comments: Pt able to go sit to supine with PT help only to move wound vac  Transfers Overall transfer level: Needs assistance   Transfers: Sit to/from Stand Sit to Stand: Min guard (to RW) Stand pivot transfers: Min guard (to [facility])       General transfer comment: requires vc for coming all the way to upright  Ambulation/Gait Ambulation/Gait assistance: Min guard Ambulation Distance (Feet): 5 Feet Assistive device: Rolling walker (2 wheeled) Gait Pattern/deviations: Decreased step length -  left;Decreased stance time - right;Step-to pattern;Shuffle;Antalgic;Decreased weight shift to right Gait velocity: slow Gait velocity interpretation: Below normal speed for age/gender General Gait Details: Pt with decreased weightbearing through R LE and increased UE support on RW vc for upright posture.       Balance Overall balance assessment: Needs assistance Sitting-balance support: Feet supported Sitting balance-Leahy Scale: Good     Standing balance support: During functional activity;Single extremity supported Standing balance-Leahy Scale: Good Standing balance comment: utilizes UE support on RW but able to clean herself with single UE support                    Cognition Arousal/Alertness: Awake/alert Behavior During Therapy: Flat affect;WFL for tasks assessed/performed Overall Cognitive Status: Within Functional Limits for tasks assessed                 General Comments: Pt much more interactive today       General Comments General comments (skin integrity, edema, etc.): Pt wound leaking around seal, pt R LE placed on towels and nursing notified after session       Pertinent Vitals/Pain Pain Assessment: Faces Faces Pain Scale: Hurts little more Pain Location: R calf Pain Descriptors / Indicators: Burning Pain Intervention(s): Monitored during session;Limited activity within patient's tolerance  VSS           PT Goals (current goals can now be found in the care plan section) Acute Rehab PT Goals Patient Stated Goal: go home PT Goal Formulation: With  patient Time For Goal Achievement: 08/01/16 Potential to Achieve Goals: Good Progress towards PT goals: Progressing toward goals    Frequency    Min 3X/week      PT Plan Current plan remains appropriate       End of Session Equipment Utilized During Treatment: Gait belt   Patient left: in bed;with call bell/phone within reach Nurse Communication: Mobility status PT Visit Diagnosis:  Other abnormalities of gait and mobility (R26.89);Pain Pain - Right/Left: Right Pain - part of body: Leg     Time: 1420-1459 PT Time Calculation (min) (ACUTE ONLY): 39 min  Charges:  $Gait Training: 8-22 mins $Therapeutic Activity: 23-37 mins                    G Codes:       Alice [name] 07/26/2016, 5:06 PM  Dani Gobble. Migdalia Dk PT, DPT Acute Rehabilitation  [phone] Pager [phone]

## 2016-07-26 NOTE — Progress Notes (Signed)
ANTICOAGULATION CONSULT NOTE - FOLLOW UP  Pharmacy Consult:  Coumadin Indication:  History of PE/DVT  Allergies  Allergen Reactions  . Oxycodone Hcl Nausea Only  . Vicodin [Hydrocodone-Acetaminophen] Nausea And Vomiting    Patient Measurements: Height: 5\' 8"  (172.7 cm) Weight: (!) 340 lb (154.2 kg) IBW/kg (Calculated) : 63.9  Vital Signs: Temp: 98 F (36.7 C) (03/13 0300) Temp Source: Oral (03/13 0300) BP: 125/64 (03/13 0300) Pulse Rate: 56 (03/13 0300)  Labs:  Recent Labs  07/25/16 0957 07/26/16 0514  HGB 11.2*  --   HCT 33.6*  --   PLT 286  --   LABPROT 33.7* 38.1*  INR 3.22 3.77  CREATININE 1.07*  --     Estimated Creatinine Clearance: 105.9 mL/min (by C-G formula based on SCr of 1.07 mg/dL (H)).     Assessment: 41 YOF presented for I&D of right leg ulcer with plan for application of skin graft tomorrow.  Pharmacy consulted to continue Coumadin from PTA for history of PE/DVT.  INR supra-therapeutic in admit and trended up further despite a dose reduction.  No bleeding reported.  Home Coumadin dose:  10mg  PO daily   Goal of Therapy:  INR 2-3    Plan:  - Hold Coumadin today - Daily PT / INR - Monitor closely for s/sx of bleeding (also on Toradol)    Lurlean Kernen D. Mina Marble, PharmD, BCPS Pager:  [phone] 07/26/2016, 8:09 AM

## 2016-07-26 NOTE — Progress Notes (Signed)
Patient ID: Alice [name], female   DOB: 06-Jun-1970, 47 y.o.   MRN: [id number] Postoperative day 1 irrigation and debridement of right leg venous stasis ulcer. Patient currently using the infusion wound VAC. Plan to return to the operating room tomorrow to apply skin graft.

## 2016-07-26 NOTE — Progress Notes (Signed)
Patient lying in bed awake and alert upon entering room at 2015. Patient oriented x 3. Patient with no c/o pain or sob. Wound vac with saline to right lower leg; intact and functioning. Call bell within reach, belongings at bedside. Will continue to monitor patients status.

## 2016-07-27 ENCOUNTER — Inpatient Hospital Stay (HOSPITAL_COMMUNITY): Payer: Medicare Other | Admitting: Certified Registered Nurse Anesthetist

## 2016-07-27 ENCOUNTER — Encounter (HOSPITAL_COMMUNITY): Payer: Self-pay | Admitting: Surgery

## 2016-07-27 ENCOUNTER — Inpatient Hospital Stay (HOSPITAL_COMMUNITY): Admission: RE | Admit: 2016-07-27 | Payer: Medicare Other | Source: Ambulatory Visit | Admitting: Orthopedic Surgery

## 2016-07-27 ENCOUNTER — Encounter (HOSPITAL_COMMUNITY)
Admission: RE | Disposition: A | Discharge status: Home Health | Payer: Self-pay | Source: Ambulatory Visit | Attending: Orthopedic Surgery

## 2016-07-27 DIAGNOSIS — I83019 Varicose veins of right lower extremity with ulcer of unspecified site: Secondary | ICD-10-CM

## 2016-07-27 HISTORY — PX: SKIN SPLIT GRAFT: SHX444

## 2016-07-27 LAB — PROTIME-INR
INR: 2.59
PROTHROMBIN TIME: 28.3 s — AB (ref 11.4–15.2)

## 2016-07-27 SURGERY — APPLICATION, GRAFT, SKIN, SPLIT-THICKNESS
Anesthesia: General | Site: Leg Lower | Laterality: Right

## 2016-07-27 MED ORDER — HYDROMORPHONE HCL 1 MG/ML IJ SOLN
INTRAMUSCULAR | Status: AC
  Filled 2016-07-27: qty 1

## 2016-07-27 MED ORDER — ONDANSETRON HCL 4 MG/2ML IJ SOLN
INTRAMUSCULAR | Status: DC | PRN
  Administered 2016-07-27: 4 mg via INTRAVENOUS

## 2016-07-27 MED ORDER — ACETAMINOPHEN 650 MG RE SUPP: 650.0000 mg | Freq: Four times a day (QID) | RECTAL | Status: DC | PRN

## 2016-07-27 MED ORDER — MEPERIDINE HCL 25 MG/ML IJ SOLN: 6.2500 mg | INTRAMUSCULAR | Status: DC | PRN

## 2016-07-27 MED ORDER — MIDAZOLAM HCL 5 MG/5ML IJ SOLN
INTRAMUSCULAR | Status: DC | PRN
  Administered 2016-07-27: 2 mg via INTRAVENOUS

## 2016-07-27 MED ORDER — METOCLOPRAMIDE HCL 5 MG PO TABS: 5.0000 mg | ORAL_TABLET | Freq: Three times a day (TID) | ORAL | Status: DC | PRN

## 2016-07-27 MED ORDER — LACTATED RINGERS IV SOLN
INTRAVENOUS | Status: DC
  Administered 2016-07-27 (×2): via INTRAVENOUS

## 2016-07-27 MED ORDER — HYDROMORPHONE HCL 1 MG/ML IJ SOLN
INTRAMUSCULAR | Status: AC
  Filled 2016-07-27: qty 0.5

## 2016-07-27 MED ORDER — DOCUSATE SODIUM 100 MG PO CAPS: 100.0000 mg | ORAL_CAPSULE | Freq: Two times a day (BID) | ORAL | Status: DC

## 2016-07-27 MED ORDER — PROPOFOL 10 MG/ML IV BOLUS
INTRAVENOUS | Status: DC | PRN
  Administered 2016-07-27: 120 mg via INTRAVENOUS

## 2016-07-27 MED ORDER — ONDANSETRON HCL 4 MG/2ML IJ SOLN: 4.0000 mg | Freq: Four times a day (QID) | INTRAMUSCULAR | Status: DC | PRN

## 2016-07-27 MED ORDER — BISACODYL 10 MG RE SUPP: 10.0000 mg | Freq: Every day | RECTAL | Status: DC | PRN

## 2016-07-27 MED ORDER — WARFARIN SODIUM 7.5 MG PO TABS
7.5000 mg | ORAL_TABLET | Freq: Once | ORAL | Status: AC
  Administered 2016-07-27: 7.5 mg via ORAL
  Filled 2016-07-27: qty 1

## 2016-07-27 MED ORDER — LIDOCAINE HCL (CARDIAC) 20 MG/ML IV SOLN
INTRAVENOUS | Status: DC | PRN
  Administered 2016-07-27: 100 mg via INTRAVENOUS

## 2016-07-27 MED ORDER — HYDROMORPHONE HCL 2 MG/ML IJ SOLN: 1.0000 mg | INTRAMUSCULAR | Status: DC | PRN

## 2016-07-27 MED ORDER — ONDANSETRON HCL 4 MG PO TABS: 4.0000 mg | ORAL_TABLET | Freq: Four times a day (QID) | ORAL | Status: DC | PRN

## 2016-07-27 MED ORDER — DEXTROSE 5 % IV SOLN
500.0000 mg | Freq: Four times a day (QID) | INTRAVENOUS | Status: DC | PRN
  Filled 2016-07-27: qty 5

## 2016-07-27 MED ORDER — METOCLOPRAMIDE HCL 5 MG/ML IJ SOLN: 5.0000 mg | Freq: Three times a day (TID) | INTRAMUSCULAR | Status: DC | PRN

## 2016-07-27 MED ORDER — OXYCODONE HCL 5 MG PO TABS
5.0000 mg | ORAL_TABLET | ORAL | Status: DC | PRN
  Administered 2016-07-27: 10 mg via ORAL
  Filled 2016-07-27: qty 2

## 2016-07-27 MED ORDER — OXYCODONE HCL 5 MG PO TABS
ORAL_TABLET | ORAL | Status: AC
  Filled 2016-07-27: qty 2

## 2016-07-27 MED ORDER — METHOCARBAMOL 500 MG PO TABS
ORAL_TABLET | ORAL | Status: AC
  Filled 2016-07-27: qty 1

## 2016-07-27 MED ORDER — POLYETHYLENE GLYCOL 3350 17 G PO PACK: 17.0000 g | PACK | Freq: Every day | ORAL | Status: DC | PRN

## 2016-07-27 MED ORDER — CEFAZOLIN SODIUM-DEXTROSE 2-4 GM/100ML-% IV SOLN
2.0000 g | Freq: Four times a day (QID) | INTRAVENOUS | Status: AC
  Administered 2016-07-27 – 2016-07-28 (×3): 2 g via INTRAVENOUS
  Filled 2016-07-27 (×4): qty 100

## 2016-07-27 MED ORDER — SODIUM CHLORIDE 0.9 % IV SOLN
INTRAVENOUS | Status: DC
  Administered 2016-07-27: 13:00:00 via INTRAVENOUS

## 2016-07-27 MED ORDER — MUPIROCIN 2 % EX OINT
TOPICAL_OINTMENT | CUTANEOUS | Status: AC
  Filled 2016-07-27: qty 22

## 2016-07-27 MED ORDER — PROPOFOL 10 MG/ML IV BOLUS
INTRAVENOUS | Status: AC
  Filled 2016-07-27: qty 40

## 2016-07-27 MED ORDER — HYDROMORPHONE HCL 1 MG/ML IJ SOLN
0.2500 mg | INTRAMUSCULAR | Status: DC | PRN
  Administered 2016-07-27 (×3): 0.5 mg via INTRAVENOUS

## 2016-07-27 MED ORDER — MINERAL OIL LIGHT 100 % EX OIL
TOPICAL_OIL | CUTANEOUS | Status: AC
  Filled 2016-07-27: qty 25

## 2016-07-27 MED ORDER — MAGNESIUM CITRATE PO SOLN: 1.0000 | Freq: Once | ORAL | Status: DC | PRN

## 2016-07-27 MED ORDER — ACETAMINOPHEN 325 MG PO TABS: 650.0000 mg | ORAL_TABLET | Freq: Four times a day (QID) | ORAL | Status: DC | PRN

## 2016-07-27 MED ORDER — METHOCARBAMOL 500 MG PO TABS
500.0000 mg | ORAL_TABLET | Freq: Four times a day (QID) | ORAL | Status: DC | PRN
  Administered 2016-07-27: 500 mg via ORAL

## 2016-07-27 MED ORDER — CEFAZOLIN SODIUM 1 G IJ SOLR
INTRAMUSCULAR | Status: DC | PRN
  Administered 2016-07-27: 3 g via INTRAMUSCULAR

## 2016-07-27 MED ORDER — FENTANYL CITRATE (PF) 100 MCG/2ML IJ SOLN
INTRAMUSCULAR | Status: AC
  Filled 2016-07-27: qty 2

## 2016-07-27 MED ORDER — FENTANYL CITRATE (PF) 100 MCG/2ML IJ SOLN
INTRAMUSCULAR | Status: DC | PRN
Start: 2016-07-27 — End: 2016-07-27
  Administered 2016-07-27: 100 ug via INTRAVENOUS

## 2016-07-27 MED ORDER — MIDAZOLAM HCL 2 MG/2ML IJ SOLN
INTRAMUSCULAR | Status: AC
  Filled 2016-07-27: qty 2

## 2016-07-27 MED ORDER — ONDANSETRON HCL 4 MG/2ML IJ SOLN: 4.0000 mg | Freq: Once | INTRAMUSCULAR | Status: DC | PRN

## 2016-07-27 MED ORDER — 0.9 % SODIUM CHLORIDE (POUR BTL) OPTIME
TOPICAL | Status: DC | PRN
  Administered 2016-07-27: 1000 mL

## 2016-07-27 SURGICAL SUPPLY — 44 items
BNDG CMPR 9X4 STRL LF SNTH (GAUZE/BANDAGES/DRESSINGS) ×1
BNDG COHESIVE 6X5 TAN STRL LF (GAUZE/BANDAGES/DRESSINGS) IMPLANT
BNDG ESMARK 4X9 LF (GAUZE/BANDAGES/DRESSINGS) ×2 IMPLANT
BNDG GAUZE STRTCH 6 (GAUZE/BANDAGES/DRESSINGS) IMPLANT
COVER SURGICAL LIGHT HANDLE (MISCELLANEOUS) ×4 IMPLANT
CUFF TOURNIQUET SINGLE 18IN (TOURNIQUET CUFF) IMPLANT
CUFF TOURNIQUET SINGLE 24IN (TOURNIQUET CUFF) IMPLANT
DERMACARRIERS GRAFT 1 TO 1.5 (DISPOSABLE)
DRAPE U-SHAPE 47X51 STRL (DRAPES) ×2 IMPLANT
DRSG ADAPTIC 3X8 NADH LF (GAUZE/BANDAGES/DRESSINGS) IMPLANT
DRSG MEPITEL 4X7.2 (GAUZE/BANDAGES/DRESSINGS) ×2 IMPLANT
DRSG VAC ATS MED SENSATRAC (GAUZE/BANDAGES/DRESSINGS) ×1 IMPLANT
DURAPREP 26ML APPLICATOR (WOUND CARE) ×2 IMPLANT
ELECT REM PT RETURN 9FT ADLT (ELECTROSURGICAL) ×2
ELECTRODE REM PT RTRN 9FT ADLT (ELECTROSURGICAL) ×1 IMPLANT
GAUZE SPONGE 4X4 12PLY STRL (GAUZE/BANDAGES/DRESSINGS) IMPLANT
GLOVE BIOGEL PI IND STRL 9 (GLOVE) ×1 IMPLANT
GLOVE BIOGEL PI INDICATOR 9 (GLOVE) ×1
GLOVE SURG ORTHO 9.0 STRL STRW (GLOVE) ×2 IMPLANT
GOWN STRL REUS W/ TWL XL LVL3 (GOWN DISPOSABLE) ×2 IMPLANT
GOWN STRL REUS W/TWL XL LVL3 (GOWN DISPOSABLE) ×4
GRAFT DERMACARRIERS 1 TO 1.5 (DISPOSABLE) IMPLANT
HANDPIECE INTERPULSE COAX TIP (DISPOSABLE) ×2
KIT BASIN OR (CUSTOM PROCEDURE TRAY) ×2 IMPLANT
KIT ROOM TURNOVER OR (KITS) ×2 IMPLANT
MANIFOLD NEPTUNE II (INSTRUMENTS) ×2 IMPLANT
NDL HYPO 25GX1X1/2 BEV (NEEDLE) IMPLANT
NEEDLE HYPO 25GX1X1/2 BEV (NEEDLE) IMPLANT
NS IRRIG 1000ML POUR BTL (IV SOLUTION) ×2 IMPLANT
PACK ORTHO EXTREMITY (CUSTOM PROCEDURE TRAY) ×2 IMPLANT
PAD ARMBOARD 7.5X6 YLW CONV (MISCELLANEOUS) ×4 IMPLANT
PAD CAST 4YDX4 CTTN HI CHSV (CAST SUPPLIES) IMPLANT
PADDING CAST COTTON 4X4 STRL (CAST SUPPLIES)
PREVENA INCISION MGT 90 150 (MISCELLANEOUS) ×1 IMPLANT
SET HNDPC FAN SPRY TIP SCT (DISPOSABLE) IMPLANT
SUCTION FRAZIER HANDLE 10FR (MISCELLANEOUS) ×1
SUCTION TUBE FRAZIER 10FR DISP (MISCELLANEOUS) IMPLANT
SUT ETHILON 4 0 PS 2 18 (SUTURE) IMPLANT
SYR CONTROL 10ML LL (SYRINGE) IMPLANT
TISSUE THERASKIN 3X6 (Tissue) ×2 IMPLANT
TOWEL OR 17X24 6PK STRL BLUE (TOWEL DISPOSABLE) ×2 IMPLANT
TOWEL OR 17X26 10 PK STRL BLUE (TOWEL DISPOSABLE) ×2 IMPLANT
TUBE CONNECTING 12X1/4 (SUCTIONS) ×1 IMPLANT
WATER STERILE IRR 1000ML POUR (IV SOLUTION) ×2 IMPLANT

## 2016-07-27 NOTE — Progress Notes (Signed)
ANTICOAGULATION CONSULT NOTE - FOLLOW UP  Pharmacy Consult:  Coumadin Indication:  History of PE/DVT  Allergies  Allergen Reactions  . Oxycodone Hcl Nausea Only  . Vicodin [Hydrocodone-Acetaminophen] Nausea And Vomiting    Patient Measurements: Height: 5\' 8"  (172.7 cm) Weight: (!) 340 lb (154.2 kg) IBW/kg (Calculated) : 63.9  Vital Signs: Temp: 98 F (36.7 C) (03/14 0435) Temp Source: Oral (03/14 0435) BP: 146/82 (03/14 0435) Pulse Rate: 89 (03/14 0435)  Labs:  Recent Labs  07/25/16 0957 07/26/16 0514 07/27/16 0639  HGB 11.2*  --   --   HCT 33.6*  --   --   PLT 286  --   --   LABPROT 33.7* 38.1* 28.3*  INR 3.22 3.77 2.59  CREATININE 1.07*  --   --     Estimated Creatinine Clearance: 105.9 mL/min (by C-G formula based on SCr of 1.07 mg/dL (H)).     Assessment: 31 YOF presented for I&D of right leg ulcer with plan for application of skin graft tomorrow.  Pharmacy consulted to continue Coumadin from PTA for history of PE/DVT.  INR supra-therapeutic in admit and now trended down to therapeutic level.  No bleeding reported.  Patient to get skin graft today.  Home Coumadin dose:  10mg  PO daily   Goal of Therapy:  INR 2-3    Plan:  - Coumadin 7.5mg  PO today - Daily PT / INR    Ilyssa Grennan D. Mina Marble, PharmD, BCPS Pager:  [phone] 07/27/2016, 7:55 AM

## 2016-07-27 NOTE — Anesthesia Preprocedure Evaluation (Addendum)
Anesthesia Evaluation  Patient identified by MRN, date of birth, ID band Patient awake    Reviewed: Allergy & Precautions, NPO status , Patient's Chart, lab work & pertinent test results  Airway Mallampati: II  TM Distance: >3 FB Neck ROM: Full    Dental  (+) Partial Upper, Dental Advisory Given   Pulmonary sleep apnea ,    Pulmonary exam normal        Cardiovascular hypertension, Pt. on medications + Peripheral Vascular Disease  Normal cardiovascular exam     Neuro/Psych  Headaches,    GI/Hepatic GERD  ,  Endo/Other    Renal/GU      Musculoskeletal   Abdominal   Peds  Hematology   Anesthesia Other Findings   Reproductive/Obstetrics                            Anesthesia Physical Anesthesia Plan  ASA: III  Anesthesia Plan: General   Post-op Pain Management:    Induction: Intravenous  Airway Management Planned: LMA  Additional Equipment:   Intra-op Plan:   Post-operative Plan: Extubation in OR  Informed Consent: I have reviewed the patients History and Physical, chart, labs and discussed the procedure including the risks, benefits and alternatives for the proposed anesthesia with the patient or authorized representative who has indicated his/her understanding and acceptance.     Plan Discussed with: CRNA and Surgeon  Anesthesia Plan Comments:         Anesthesia Quick Evaluation

## 2016-07-27 NOTE — Progress Notes (Signed)
Patient states that she "is going to die from the smell" that is coming from her leg. She is insisting that Dr. Sharol Given see her now. Patient was explained to that the doctor was in surgery and the room was cleaned and sprayed well to try to help decrease the odor. MD notified, nurse will continue to monitor

## 2016-07-27 NOTE — Op Note (Signed)
07/25/2016 - 07/27/2016  11:55 AM  PATIENT:  Alice [name]    PRE-OPERATIVE DIAGNOSIS:  Venous Stasis Insufficiency Ulcer Right Leg  POST-OPERATIVE DIAGNOSIS:  Same  PROCEDURE:  SKIN GRAFT RIGHT LEG WITH THERASKIN APPLICATION  SURGEON:  [name], MD  PHYSICIAN ASSISTANT:None ANESTHESIA:   General  PREOPERATIVE INDICATIONS:  Alice [name] is a  46 y.o. female with a diagnosis of Venous Stasis Insufficiency Ulcer Right Leg who failed conservative measures and elected for surgical management.    The risks benefits and alternatives were discussed with the patient preoperatively including but not limited to the risks of infection, bleeding, nerve injury, cardiopulmonary complications, the need for revision surgery, among others, and the patient was willing to proceed.  OPERATIVE IMPLANTS: TheraSkin 3 x 6", wound VAC medium width Prevena pump  OPERATIVE FINDINGS: Good granulation tissue.  OPERATIVE PROCEDURE: Patient brought the operating room and underwent a general anesthetic. After adequate levels anesthesia obtained patient's right lower extremity was prepped using Betadine and draped into a sterile field a timeout was called. A 21 blade knife was used to further debride the wound bed. The wound bed 100% beefy granulation tissue. The wound was irrigated with pulsatile lavage after sharp debridement. A 3 x 6" TheraSkin graft was applied this was stapled in place. A medium wound VAC sponge was applied on top of Mepitel dressing this was then wrapped and had a good suction fit. Patient was extubated taken to the PACU in stable condition.

## 2016-07-27 NOTE — Anesthesia Postprocedure Evaluation (Signed)
Anesthesia Post Note  Patient: Alice [name]  Procedure(s) Performed: Procedure(s) (LRB): SKIN GRAFT RIGHT LEG WITH THERASKIN APPLICATION (Right)  Patient location during evaluation: PACU Anesthesia Type: General Level of consciousness: awake and alert Pain management: pain level controlled Vital Signs Assessment: post-procedure vital signs reviewed and stable Respiratory status: spontaneous breathing, nonlabored ventilation, respiratory function stable and patient connected to nasal cannula oxygen Cardiovascular status: blood pressure returned to baseline and stable Postop Assessment: no signs of nausea or vomiting Anesthetic complications: no       Last Vitals:  Vitals:   07/27/16 1230 07/27/16 1521  BP: (!) 146/91 130/71  Pulse: 79 79  Resp: 13 14  Temp: 36.6 C 36.6 C    Last Pain:  Vitals:   07/27/16 1521  TempSrc: Oral  PainSc:                  Alice [name]

## 2016-07-27 NOTE — Interval H&P Note (Signed)
History and Physical Interval Note:  07/27/2016 6:57 AM  Alice [name]  has presented today for surgery, with the diagnosis of Venous Stasis Insufficiency Ulcer Right Leg  The various methods of treatment have been discussed with the patient and family. After consideration of risks, benefits and other options for treatment, the patient has consented to  Procedure(s): SPLIT THICKNESS SKIN GRAFT RIGHT LEG (Right) as a surgical intervention .  The patient's history has been reviewed, patient examined, no change in status, stable for surgery.  I have reviewed the patient's chart and labs.  Questions were answered to the patient's satisfaction.     [name]

## 2016-07-27 NOTE — Transfer of Care (Signed)
Immediate Anesthesia Transfer of Care Note  Patient: Alice [name]  Procedure(s) Performed: Procedure(s): SKIN GRAFT RIGHT LEG WITH THERASKIN APPLICATION (Right)  Patient Location: PACU  Anesthesia Type:General  Level of Consciousness: awake and alert   Airway & Oxygen Therapy: Patient Spontanous Breathing and Patient connected to nasal cannula oxygen  Post-op Assessment: Report given to RN, Post -op Vital signs reviewed and stable and Patient moving all extremities X 4  Post vital signs: Reviewed and stable  Last Vitals:  Vitals:   07/26/16 1930 07/27/16 0435  BP: 131/75 (!) 146/82  Pulse: 74 89  Resp: 17 17  Temp: 36.8 C 36.7 C    Last Pain:  Vitals:   07/27/16 0900  TempSrc:   PainSc: 0-No pain      Patients Stated Pain Goal: 0 (03/70/48 8891)  Complications: No apparent anesthesia complications

## 2016-07-27 NOTE — Anesthesia Procedure Notes (Signed)
Procedure Name: LMA Insertion Date/Time: 07/27/2016 11:20 AM Performed by: Suzy Bouchard Pre-anesthesia Checklist: Patient identified, Emergency Drugs available, Suction available, Timeout performed and Patient being monitored Patient Re-evaluated:Patient Re-evaluated prior to inductionOxygen Delivery Method: Circle system utilized Preoxygenation: Pre-oxygenation with 100% oxygen Intubation Type: IV induction LMA: LMA inserted LMA Size: 4.0 Tube type: Oral Number of attempts: 1 Placement Confirmation: positive ETCO2 and breath sounds checked- equal and bilateral Tube secured with: Tape Dental Injury: Teeth and Oropharynx as per pre-operative assessment

## 2016-07-28 ENCOUNTER — Encounter (HOSPITAL_COMMUNITY): Payer: Self-pay | Admitting: Orthopedic Surgery

## 2016-07-28 LAB — PROTIME-INR
INR: 2.32
Prothrombin Time: 25.9 seconds — ABNORMAL HIGH (ref 11.4–15.2)

## 2016-07-28 MED ORDER — WARFARIN SODIUM 5 MG PO TABS: 10.0000 mg | ORAL_TABLET | Freq: Once | ORAL | Status: DC

## 2016-07-28 MED ORDER — OXYCODONE HCL 5 MG PO TABS: 5.0000 mg | ORAL_TABLET | ORAL | 0 refills | Status: DC | PRN

## 2016-07-28 NOTE — Progress Notes (Signed)
Physical Therapy Treatment Patient Details Name: Alice [name] MRN: [id number] DOB: 01/29/1971 Today's Date: 07/28/2016    History of Present Illness Pt is a 46 yo woman with chronic venous ulcer on R calf. s/p I &D on 07/25/16 and skin graft 07/27/16. PMH significant for Anemia, Chronic Bronchitis, DVT, PE, HTN, and back pain    PT Comments    Pt ambulation difficult to assess with wound dressing covering entire R foot making weightbearing more difficult. Pt able to transfer and ambulate with RW and min guard assist. Pt with good UE strength to aid in transfers and mobility.  Pt scheduled to discharge 07/28/16 but would continue to benefit from skilled PT while in the acute environment for gait and stair training to increase safety in her home environment.    Follow Up Recommendations  Home health PT;Supervision/Assistance - 24 hour     Equipment Recommendations  Rolling walker with 5" wheels    Recommendations for Other Services       Precautions / Restrictions Precautions Required Braces or Orthoses:  (Wound Vac R LE) Restrictions Weight Bearing Restrictions: Yes RLE Weight Bearing: Weight bearing as tolerated    Mobility  Bed Mobility Overal bed mobility: Modified Independent Bed Mobility: Sit to Supine       Sit to supine: Modified independent (Device/Increase time)   General bed mobility comments: Pt R LE less painful today and able to move easily to come EoB  Transfers Overall transfer level: Modified independent Equipment used: Rolling walker (2 wheeled) Transfers: Sit to/from Stand Sit to Stand: Min guard (to RW)         General transfer comment: vc for more controlled descent from standing, and utilizing good hand placement on the recliner as opposed to flopping backward  Ambulation/Gait Ambulation/Gait assistance: Min guard   Assistive device: Rolling walker (2 wheeled) Gait Pattern/deviations: Decreased step length - left;Decreased stance time -  right;Step-to pattern;Shuffle;Antalgic;Decreased weight shift to right;Trunk flexed (hop to pattern) Gait velocity: slow Gait velocity interpretation: Below normal speed for age/gender General Gait Details: Decreased ability to assess gait due to wound wrapping that enclosed entire foot. Pt with decreased weightbearing through R LE and increased UE support on RW vc for upright posture, and increased weightbearing through R LE to improve safety with movement.        Balance Overall balance assessment: Needs assistance Sitting-balance support: Feet supported Sitting balance-Leahy Scale: Good     Standing balance support: During functional activity;Single extremity supported Standing balance-Leahy Scale: Fair Standing balance comment: utilizes UE support on RW in standing                    Cognition Arousal/Alertness: Awake/alert Behavior During Therapy: Impulsive Overall Cognitive Status: Within Functional Limits for tasks assessed                 General Comments: pt moved quickly and had decreased safety awareness           Pertinent Vitals/Pain Pain Assessment: Faces Faces Pain Scale: Hurts a little bit Pain Location: R calf Pain Descriptors / Indicators: Burning Pain Intervention(s): Monitored during session;Limited activity within patient's tolerance           PT Goals (current goals can now be found in the care plan section) Acute Rehab PT Goals Patient Stated Goal: go home PT Goal Formulation: With patient Time For Goal Achievement: 08/01/16 Potential to Achieve Goals: Good    Frequency    Min 3X/week  PT Plan Current plan remains appropriate       End of Session Equipment Utilized During Treatment: Gait belt   Patient left: with call bell/phone within reach;in chair;with family/visitor present Nurse Communication: Mobility status PT Visit Diagnosis: Other abnormalities of gait and mobility (R26.89);Pain Pain - Right/Left:  Right Pain - part of body: Leg     Time: 4712-5271 PT Time Calculation (min) (ACUTE ONLY): 26 min  Charges:  $Gait Training: 8-22 mins                    G Codes:       Alice [name] 07/28/2016, 12:24 PM  Alice [name] PT, DPT Acute Rehabilitation  [phone] Pager [phone]

## 2016-07-28 NOTE — Care Management Note (Signed)
Case Management Note  Patient Details  Name: Alice [name] MRN: [id number] Date of Birth: [birth year]  Subjective/Objective:  46 yr old   Female admitted with right leg venous stasis ulcer. Patient underwent  I & D with skin graft and treraskin application and Prevena wound vac.                 Action/Plan:  Case manager spke with patient concerning discharge plan. Choice was offered for Home Health agency. Referral was called to Stevie Kern, [place] Liaison. Patient has RW at home. Will have family support at discharge.   Expected Discharge Date:   07/28/16               Expected Discharge Plan:  [place]  In-House Referral:  NA  Discharge planning Services  CM Consult  Post Acute Care Choice:  Home Health Choice offered to:  Patient  DME Arranged:  N/A (PAtient has RW) DME Agency:  NA  HH Arranged:  PT [place] Agency:  [place]  Status of Service:  Completed, signed off  If discussed at [place] of Stay Meetings, dates discussed:    Additional Comments:  Ninfa Meeker, RN 07/28/2016, 10:34 AM

## 2016-07-28 NOTE — Progress Notes (Signed)
ANTICOAGULATION CONSULT NOTE - FOLLOW UP  Pharmacy Consult:  Coumadin Indication:  History of PE/DVT  Allergies  Allergen Reactions  . Oxycodone Hcl Nausea Only  . Vicodin [Hydrocodone-Acetaminophen] Nausea And Vomiting    Patient Measurements: Height: 5\' 8"  (172.7 cm) Weight: (!) 340 lb (154.2 kg) IBW/kg (Calculated) : 63.9  Vital Signs: Temp: 98.5 F (36.9 C) (03/15 0530) Temp Source: Oral (03/15 0530) BP: 113/57 (03/15 0530) Pulse Rate: 90 (03/15 0530)  Labs:  Recent Labs  07/25/16 0957 07/26/16 0514 07/27/16 0639 07/28/16 0604  HGB 11.2*  --   --   --   HCT 33.6*  --   --   --   PLT 286  --   --   --   LABPROT 33.7* 38.1* 28.3* 25.9*  INR 3.22 3.77 2.59 2.32  CREATININE 1.07*  --   --   --     Estimated Creatinine Clearance: 105.9 mL/min (A) (by C-G formula based on SCr of 1.07 mg/dL (H)).     Assessment: 36 YOF presented for I&D of right leg ulcer now s/p application of skin graft.  Pharmacy consulted to continue Coumadin from PTA for history of PE/DVT.  INR supra-therapeutic in admit and now trended down to therapeutic level.  No bleeding reported.    Home Coumadin dose:  10mg  PO daily   Goal of Therapy:  INR 2-3    Plan:  - Coumadin 10mg  PO today - Daily PT / INR    Alice [name], PharmD, BCPS Pager:  [phone] 07/28/2016, 7:41 AM

## 2016-07-28 NOTE — Discharge Summary (Signed)
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 female who was admitted 07/25/2016 with a chief complaint of ulceration right lower extremity. Was found to have a diagnosis of Venous Stasis Insufficiency Ulcer Right Leg.  They were brought to the operating room on 07/25/2016 - 07/27/2016 and underwent Procedure(s): SKIN GRAFT RIGHT LEG WITH THERASKIN APPLICATION.    Hospital was unremarkable. Today patient without complaint. Sitting up in bed, pleasant. No distress. With family at the bedside. States is ready to go home today.  Discussed that she will continue the wound VAC until follow-up next Wednesday in the office. Call with any concerns. If the wound VAC alarms she is to remove this and apply dry dressing.  They were given perioperative antibiotics:  Anti-infectives    Start     Dose/Rate Route Frequency Ordered Stop   07/27/16 1700  ceFAZolin (ANCEF) IVPB 2g/100 mL premix     2 g 200 mL/hr over 30 Minutes Intravenous Every 6 hours 07/27/16 1250 07/28/16 0558   07/25/16 1800  ceFAZolin (ANCEF) IVPB 2g/100 mL premix     2 g 200 mL/hr over 30 Minutes Intravenous Every 6 hours 07/25/16 1401 07/26/16 0648   07/25/16 1100  ceFAZolin (ANCEF) 3 g in dextrose 5 % 50 mL IVPB     3 g 130 mL/hr over 30 Minutes Intravenous To [facility]Stay [facility] 07/22/16 1201 07/25/16 1224   07/25/16 0949  ceFAZolin (ANCEF) IVPB 2g/100 mL premix  Status:  Discontinued     2 g 200 mL/hr over 30 Minutes Intravenous On call to O.R. 07/25/16 8563 07/25/16 0957    .  They were given sequential compression devices, early ambulation, and Coumadin for DVT prophylaxis.  Recent vital signs:  Patient Vitals for the past 24 hrs:  BP Temp Temp src Pulse Resp SpO2  07/28/16 0820 113/63  98.8 F (37.1 C) Oral 77 20 100 %  07/28/16 0530 (!) 113/57 98.5 F (36.9 C) Oral 90 20 100 %  07/27/16 2129 (!) 142/77 97.5 F (36.4 C) Oral 86 20 100 %  07/27/16 1521 130/71 97.8 F (36.6 C) Oral 79 14 99 %  07/27/16 1230 (!) 146/91 97.8 F (36.6 C) - 79 13 100 %  07/27/16 1215 (!) 145/88 - - 85 13 99 %  07/27/16 1200 - - - 86 16 95 %  07/27/16 1154 (!) 150/97 97.8 F (36.6 C) - 95 14 94 %  .  Recent laboratory studies: No results found.  Discharge Medications:   Allergies as of 07/28/2016      Reactions   Oxycodone Hcl Nausea Only   Vicodin [hydrocodone-acetaminophen] Nausea And Vomiting      Medication List    TAKE these medications   albuterol 108 (90 Base) MCG/ACT inhaler Commonly known as:  PROVENTIL HFA;VENTOLIN HFA Inhale 3 puffs into the lungs every 6 (six) hours as needed for wheezing or shortness of breath.   amLODipine 10 MG tablet Commonly known as:  NORVASC Take 1 tablet (10 mg total) by mouth daily.   buPROPion 150 MG 24 hr tablet Commonly known as:  WELLBUTRIN XL Take 150 mg by mouth daily.   gabapentin 300 MG capsule Commonly known as:  NEURONTIN Take 300 mg by mouth at bedtime.   hydrochlorothiazide 25 MG tablet  Commonly known as:  HYDRODIURIL Take 25 mg by mouth daily.   ibuprofen 600 MG tablet Commonly known as:  ADVIL,MOTRIN Take 1 tablet (600 mg total) by mouth every 6 (six) hours as needed.   levonorgestrel 20 MCG/24HR IUD Commonly known as:  MIRENA 1 each by Intrauterine route once. Placed in 2013.   oxyCODONE 5 MG immediate release tablet Commonly known as:  Oxy IR/ROXICODONE Take 1 tablet (5 mg total) by mouth every 4 (four) hours as needed for breakthrough pain.   phenazopyridine 200 MG tablet Commonly known as:  PYRIDIUM Take 1 tablet (200 mg total) by mouth 3 (three) times daily.   phentermine 37.5 MG capsule Take 37.5 mg by mouth daily.   phentermine 37.5 MG tablet Commonly known as:  ADIPEX-P TK 1 T PO  ONCE A DAY    sulindac 200 MG tablet Commonly known as:  CLINORIL Take 200 mg by mouth 3 (three) times daily.   Vitamin D3 5000 units Caps Take 1 capsule by mouth daily.   warfarin 2.5 MG tablet Commonly known as:  COUMADIN Take 5 tablets (12.5 mg total) by mouth one time only at 6 PM. Take daily until INR checked and then per PCP. What changed:  how much to take  when to take this  additional instructions       Diagnostic Studies: Ct Head Wo Contrast  Result Date: 07/18/2016 CLINICAL DATA:  Fall 2 days ago in bathtub. Swelling to both eyes. Left headache. EXAM: CT HEAD WITHOUT CONTRAST CT MAXILLOFACIAL WITHOUT CONTRAST TECHNIQUE: Multidetector CT imaging of the head and maxillofacial structures were performed using the standard protocol without intravenous contrast. Multiplanar CT image reconstructions of the maxillofacial structures were also generated. COMPARISON:  04/04/2014 FINDINGS: CT HEAD FINDINGS Brain: No acute intracranial abnormality. Specifically, no hemorrhage, hydrocephalus, mass lesion, acute infarction, or significant intracranial injury. Vascular: No hyperdense vessel or unexpected calcification. Skull: No acute calvarial abnormality. Other: In the forehead region. CT MAXILLOFACIAL FINDINGS Osseous: No facial or orbital fracture. Orbits: Mild soft tissue swelling over the orbits. Orbital soft tissues otherwise unremarkable. Globes are intact. Sinuses: Mucosal thickening within the maxillary sinuses bilaterally. No air-fluid levels. Mastoid air cells are clear. Soft tissues: Soft tissue swelling over the orbits and forehead. IMPRESSION: No acute intracranial abnormality. No orbital or facial fracture. Electronically Signed   By: Rolm Baptise M.D.   On: 07/18/2016 09:10   Ct Maxillofacial Wo Contrast  Result Date: 07/18/2016 CLINICAL DATA:  Fall 2 days ago in bathtub. Swelling to both eyes. Left headache. EXAM: CT HEAD WITHOUT CONTRAST CT MAXILLOFACIAL WITHOUT CONTRAST TECHNIQUE:  Multidetector CT imaging of the head and maxillofacial structures were performed using the standard protocol without intravenous contrast. Multiplanar CT image reconstructions of the maxillofacial structures were also generated. COMPARISON:  04/04/2014 FINDINGS: CT HEAD FINDINGS Brain: No acute intracranial abnormality. Specifically, no hemorrhage, hydrocephalus, mass lesion, acute infarction, or significant intracranial injury. Vascular: No hyperdense vessel or unexpected calcification. Skull: No acute calvarial abnormality. Other: In the forehead region. CT MAXILLOFACIAL FINDINGS Osseous: No facial or orbital fracture. Orbits: Mild soft tissue swelling over the orbits. Orbital soft tissues otherwise unremarkable. Globes are intact. Sinuses: Mucosal thickening within the maxillary sinuses bilaterally. No air-fluid levels. Mastoid air cells are clear. Soft tissues: Soft tissue swelling over the orbits and forehead. IMPRESSION: No acute intracranial abnormality. No orbital or facial fracture. Electronically Signed   By: Rolm Baptise M.D.   On: 07/18/2016 09:10    She benefited maximally from their hospital  stay and there were no complications.     Disposition: 01-Home or Self Care  Follow-up Information    [name], MD In 1 week.   Specialty:  Orthopedic Surgery Contact information: [address] [place] [address] [phone]          To be switched over to [place] wound vac prior to discharge. Leave this in place until follow up in office in 1 week. Patient understands will remove prevena and vac dressing at next appointment.   Elevate lower extremity around the clock.   Signed: Suzan Slick 07/28/2016, 11:01 AM

## 2016-07-28 NOTE — Progress Notes (Signed)
Patient verbalized understanding of discharge instructions and was provided with discharge paperwork. Patient's IV removed. Patient had provera wound vac placed on patient from hospital wound vac by wound vac representative and provera wound vac running properly, no beeping. Patient verbalized understanding of using provera wound vac when representative was present in the room. Patient understands that she will have the wound vac assessed at her appointment in one week with Dr Sharol Given.  Rn called Dr Jess Barters office because patient's pain prescription had not been signed prior to discharge. Office representative stated that patient could come by the office on her way home and that Dr Sharol Given or his NP Junie Panning could sign prescription for her. Patient verbalizes understanding of this.

## 2016-08-01 ENCOUNTER — Telehealth (INDEPENDENT_AMBULATORY_CARE_PROVIDER_SITE_OTHER): Payer: Self-pay | Admitting: Orthopedic Surgery

## 2016-08-01 ENCOUNTER — Encounter (INDEPENDENT_AMBULATORY_CARE_PROVIDER_SITE_OTHER): Payer: Self-pay | Admitting: Orthopedic Surgery

## 2016-08-01 ENCOUNTER — Ambulatory Visit (INDEPENDENT_AMBULATORY_CARE_PROVIDER_SITE_OTHER): Payer: Medicare Other | Admitting: Family

## 2016-08-01 DIAGNOSIS — L97919 Non-pressure chronic ulcer of unspecified part of right lower leg with unspecified severity: Secondary | ICD-10-CM

## 2016-08-01 DIAGNOSIS — Z9889 Other specified postprocedural states: Secondary | ICD-10-CM

## 2016-08-01 DIAGNOSIS — I83019 Varicose veins of right lower extremity with ulcer of unspecified site: Secondary | ICD-10-CM

## 2016-08-01 DIAGNOSIS — Z945 Skin transplant status: Secondary | ICD-10-CM | POA: Insufficient documentation

## 2016-08-01 NOTE — Progress Notes (Signed)
Office Visit Note   Patient: Alice [name]           Date of Birth: 04-11-1971           MRN: [id number] Visit Date: 08/01/2016              Requested by: Benito Mccreedy, MD [address] [place], [place] [address] PCP: Benito Mccreedy, MD  Chief Complaint  Patient presents with  . Right Leg - Follow-up    HPI: Patient is a 46 y.o female who presents with right leg status post theraskin grafting with wound vac. She is 5 days post op. She presents today with pain and tightness and complaining of foul odor. She states canister was full on Saturday, and that the vac is very uncomfortable to wear. She is very sensitive to touch. She is currently taking percocet for her pain Maxcine Ham, RT    Assessment & Plan: Visit Diagnoses:  1. Hx of skin graft   2. OBESITY, MORBID   3. 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 Dynaflex wrap with Silvadene applied to graft site. Home health to assist with wound care. She'll follow up in 1 week.  Follow-Up Instructions: Return in about 1 week (around 08/08/2016).   Ortho Exam Right lower extremity skin grafting in place. There is a foul odor from moisture. There is a half-dollar sized hematoma that was debrided in the office with gauze. Some serosanguineous drainage. There is no purulence. There is no surrounding erythema and no cellulitis no infection.  Imaging: No results found.  Labs: Lab Results  Component Value Date   REPTSTATUS 05/17/2013 FINAL 05/17/2013   REPTSTATUS 05/17/2013 FINAL 05/17/2013   REPTSTATUS 05/19/2013 FINAL 05/17/2013   GRAMSTAIN  05/17/2013    MODERATE WBC PRESENT, PREDOMINANTLY MONONUCLEAR MODERATE GRAM POSITIVE COCCI IN PAIRS IN CHAINS RARE GRAM NEGATIVE RODS Gram Stain Report Called to,Read Back By and Verified With: HOPPER,M RN @ [phone] 05/17/13 LEONARD,A   GRAMSTAIN  05/17/2013    MODERATE WBC PRESENT, PREDOMINANTLY  MONONUCLEAR MODERATE SQUAMOUS EPITHELIAL CELLS PRESENT MODERATE GRAM POSITIVE COCCI IN PAIRS IN CHAINS RARE GRAM NEGATIVE RODS Performed at [facility]  05/17/2013    NORMAL OROPHARYNGEAL FLORA Performed at [facility] 09/30/2007    Orders:  No orders of the defined types were placed in this encounter.  No orders of the defined types were placed in this encounter.    Procedures: No procedures performed  Clinical Data: No additional findings.  Subjective: Review of Systems  Constitutional: Negative for chills and fever.  Skin: Positive for wound.    Objective: Vital Signs: LMP 07/11/2016 (Approximate)   Specialty Comments:  No specialty comments available.  PMFS History: Patient Active Problem List   Diagnosis Date Noted  . Hx of skin graft 08/01/2016  . 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 Chronic venous hypertension (idiopathic) with ulcer and inflammation of right lower extremity ([place]) 07/05/2016  . Trichomonal infection 11/24/2015  . Abdominal pain 05/02/2015  . Symptomatic cholelithiasis 04/16/2015  . Acute bilateral upper abdominal pain 04/16/2015  . Anemia 05/18/2013  . Hypotension, unspecified 05/17/2013  . CKD (chronic kidney disease), stage III 05/17/2013  . Anal fissure 02/06/2013  . Anal skin tag 02/06/2013  . ALLERGIC RHINITIS 03/05/2010  . LOW BACK PAIN SYNDROME 12/13/2007  . WOUND  INFECTION 10/10/2007  . OBESITY, MORBID 12/02/2006  . HTN (hypertension) 12/02/2006  . History of pulmonary embolism 12/02/2006  . History of DVT (deep vein thrombosis) 12/02/2006  . 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 GASTROESOPHAGEAL REFLUX DISEASE 12/02/2006  . OSA (obstructive sleep apnea) 12/02/2006   Past Medical History:  Diagnosis Date  . Anemia   . Anxiety   . Chronic bronchitis ([facility])   . Chronic upper back pain   . Depression   . DVT  (deep venous thrombosis) (HCC)    BLE  . GERD (gastroesophageal reflux disease)   . Headache    "weekly" (04/16/2015)  . Hypertension   . Migraine    "monthly" (04/16/2015)  . Obesity   . Pneumonia 2014  . Pulmonary embolism ([place])   . Sinusitis nasal   . Sleep apnea    "I'm suppose to wear a mask but I don't" (04/16/2015)  . Varicose veins of right lower extremity   . Venous stasis of lower extremity    right    Family History  Problem Relation Age of Onset  . Kidney disease Mother     kidney transplant    Past Surgical History:  Procedure Laterality Date  . CHOLECYSTECTOMY N/A 04/18/2015   Procedure: LAPAROSCOPIC CHOLECYSTECTOMY;  Surgeon: Ralene Ok, MD;  Location: [facility];  Service: General;  Laterality: N/A;  . HYSTEROSCOPY W/D&C  12/24/2001   Archie Endo 09/28/2010  . I&D EXTREMITY Right 07/25/2016   Procedure: IRRIGATION AND DEBRIDEMENT RIGHT LEG ULCER, APPLY VERAFLO VAC;  Surgeon: [name], MD;  Location: [facility];  Service: Orthopedics;  Laterality: Right;  . INCISE AND DRAIN ABCESS Right 07/14/2016  . INCISION AND DRAINAGE Right 09/10/2008   leg:  skin and soft tissue and muscle/notes 09/15/2010  . INCISION AND DRAINAGE Right 01/01/2008   Chronic venous stasis insufficiency ulcer,/notes 09/14/2010/  . INCISION AND DRAINAGE Right 0/9381   calf w/application wound vac/notes 07/20/2010  . INCISION AND DRAINAGE Right 07/25/2016   IRRIGATION AND DEBRIDEMENT RIGHT LEG ULCER,  . LAPAROSCOPIC GASTRIC BYPASS  ~ 2007  . SKIN GRAFT SPLIT THICKNESS LEG / FOOT Right 07/25/2016   LEG  . SKIN SPLIT GRAFT Right 07/27/2016   Procedure: SKIN GRAFT RIGHT LEG WITH THERASKIN APPLICATION;  Surgeon: [name], MD;  Location: [facility];  Service: Orthopedics;  Laterality: Right;  . TONSILLECTOMY     Social History   Occupational History  . Not on file.   Social History Main Topics  . Smoking status: Never Smoker  . Smokeless tobacco: Never Used  . Alcohol use No  . Drug use: No  . Sexual  activity: Yes    Partners: Male    Birth control/ protection: IUD

## 2016-08-01 NOTE — Telephone Encounter (Signed)
I called patient to make appt.  She stated her pain has increased since I spoke with her an hour ago.  She feels she cannot wait until Wed or Thursday for appt.  She was worked in today at [facility].

## 2016-08-01 NOTE — Telephone Encounter (Signed)
Patient calling re the portable wound vac.  She states it is causing a lot of discomfort.  She also would like to know when she is to return.

## 2016-08-01 NOTE — Telephone Encounter (Signed)
Please call pt to make appt this week.

## 2016-08-04 ENCOUNTER — Ambulatory Visit (INDEPENDENT_AMBULATORY_CARE_PROVIDER_SITE_OTHER): Payer: Medicare Other | Admitting: Orthopedic Surgery

## 2016-08-04 ENCOUNTER — Encounter (INDEPENDENT_AMBULATORY_CARE_PROVIDER_SITE_OTHER): Payer: Self-pay | Admitting: Orthopedic Surgery

## 2016-08-04 DIAGNOSIS — L97919 Non-pressure chronic ulcer of unspecified part of right lower leg with unspecified severity: Secondary | ICD-10-CM

## 2016-08-04 DIAGNOSIS — I83019 Varicose veins of right lower extremity with ulcer of unspecified site: Secondary | ICD-10-CM

## 2016-08-04 MED ORDER — OXYCODONE HCL 5 MG PO TABS: 5.0000 mg | ORAL_TABLET | Freq: Four times a day (QID) | ORAL | 0 refills | Status: DC | PRN

## 2016-08-04 NOTE — Addendum Note (Signed)
Addended by: Meridee Score on: 08/04/2016 04:42 PM   Modules accepted: Orders

## 2016-08-04 NOTE — Progress Notes (Signed)
Office Visit Note   Patient: Alice [name]           Date of Birth: 1970-07-29           MRN: [id number] Visit Date: 08/04/2016              Requested by: Benito Mccreedy, MD [address] [address] [place], [place] [address] PCP: Benito Mccreedy, MD  Chief Complaint  Patient presents with  . Right Leg - Follow-up    Skin graft right leg with theraskin application 2/68/34    HPI: Patient is status post split-thickness skin graft to the right leg. Presents complaining of pain in drainage.  Assessment & Plan: Visit Diagnoses:  1. 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 Discussed the importance of elevation.  Follow-Up Instructions: Return in about 2 weeks (around 08/18/2016).   Ortho Exam  Patient is alert, oriented, no adenopathy, well-dressed, normal affect, normal respiratory effort. Patient has an antalgic gait examination she does have some clear drainage from the wound these full vascular skin graft is intact there is some granulating buds into the skin graft.  Imaging: No results found.  Labs: Lab Results  Component Value Date   REPTSTATUS 05/17/2013 FINAL 05/17/2013   REPTSTATUS 05/17/2013 FINAL 05/17/2013   REPTSTATUS 05/19/2013 FINAL 05/17/2013   GRAMSTAIN  05/17/2013    MODERATE WBC PRESENT, PREDOMINANTLY MONONUCLEAR MODERATE GRAM POSITIVE COCCI IN PAIRS IN CHAINS RARE GRAM NEGATIVE RODS Gram Stain Report Called to,Read Back By and Verified With: HOPPER,M RN @ [phone] 05/17/13 LEONARD,A   GRAMSTAIN  05/17/2013    MODERATE WBC PRESENT, PREDOMINANTLY MONONUCLEAR MODERATE SQUAMOUS EPITHELIAL CELLS PRESENT MODERATE GRAM POSITIVE COCCI IN PAIRS IN CHAINS RARE GRAM NEGATIVE RODS Performed at [facility]  05/17/2013    NORMAL OROPHARYNGEAL FLORA Performed at [facility] 09/30/2007    Orders:  No orders of the defined types were placed in this encounter.  No orders of the defined types were placed in this encounter.    Procedures: No procedures performed  Clinical Data: No additional findings.  ROS: Review of Systems  All other systems reviewed and are negative.   Objective: Vital Signs: LMP 07/11/2016 (Approximate)   Specialty Comments:  No specialty comments available.  PMFS History: Patient Active Problem List   Diagnosis Date Noted  . Hx of skin graft 08/01/2016  . 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 Chronic venous hypertension (idiopathic) with ulcer and inflammation of right lower extremity ([facility]) 07/05/2016  . Trichomonal infection 11/24/2015  . Abdominal pain 05/02/2015  . Symptomatic cholelithiasis 04/16/2015  . Acute bilateral upper abdominal pain 04/16/2015  . Anemia 05/18/2013  . Hypotension, unspecified 05/17/2013  . CKD (chronic kidney disease), stage III 05/17/2013  . Anal fissure 02/06/2013  . Anal skin tag 02/06/2013  . ALLERGIC RHINITIS 03/05/2010  . LOW BACK PAIN SYNDROME 12/13/2007  . WOUND INFECTION 10/10/2007  . OBESITY, MORBID 12/02/2006  . HTN (hypertension) 12/02/2006  . History of pulmonary embolism 12/02/2006  . History of DVT (deep vein thrombosis) 12/02/2006  . 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 GASTROESOPHAGEAL REFLUX DISEASE 12/02/2006  . OSA (obstructive sleep apnea) 12/02/2006   Past Medical History:  Diagnosis Date  . Anemia   . Anxiety   .  Chronic bronchitis ([facility])   . Chronic upper back pain   . Depression   . DVT (deep venous thrombosis) (HCC)    BLE  . GERD (gastroesophageal reflux disease)   . Headache    "weekly" (04/16/2015)  . Hypertension   . Migraine    "monthly" (04/16/2015)  . Obesity   . Pneumonia 2014  . Pulmonary embolism ([facility])   . Sinusitis nasal   . Sleep apnea    "I'm suppose to wear a mask but  I don't" (04/16/2015)  . Varicose veins of right lower extremity   . Venous stasis of lower extremity    right    Family History  Problem Relation Age of Onset  . Kidney disease Mother     kidney transplant    Past Surgical History:  Procedure Laterality Date  . CHOLECYSTECTOMY N/A 04/18/2015   Procedure: LAPAROSCOPIC CHOLECYSTECTOMY;  Surgeon: Ralene Ok, MD;  Location: [facility];  Service: General;  Laterality: N/A;  . HYSTEROSCOPY W/D&C  12/24/2001   Archie Endo 09/28/2010  . I&D EXTREMITY Right 07/25/2016   Procedure: IRRIGATION AND DEBRIDEMENT RIGHT LEG ULCER, APPLY VERAFLO VAC;  Surgeon: [name], MD;  Location: [facility];  Service: Orthopedics;  Laterality: Right;  . INCISE AND DRAIN ABCESS Right 07/14/2016  . INCISION AND DRAINAGE Right 09/10/2008   leg:  skin and soft tissue and muscle/notes 09/15/2010  . INCISION AND DRAINAGE Right 01/01/2008   Chronic venous stasis insufficiency ulcer,/notes 09/14/2010/  . INCISION AND DRAINAGE Right 01/7587   calf w/application wound vac/notes 07/20/2010  . INCISION AND DRAINAGE Right 07/25/2016   IRRIGATION AND DEBRIDEMENT RIGHT LEG ULCER,  . LAPAROSCOPIC GASTRIC BYPASS  ~ 2007  . SKIN GRAFT SPLIT THICKNESS LEG / FOOT Right 07/25/2016   LEG  . SKIN SPLIT GRAFT Right 07/27/2016   Procedure: SKIN GRAFT RIGHT LEG WITH THERASKIN APPLICATION;  Surgeon: [name], MD;  Location: [facility];  Service: Orthopedics;  Laterality: Right;  . TONSILLECTOMY     Social History   Occupational History  . Not on file.   Social History Main Topics  . Smoking status: Never Smoker  . Smokeless tobacco: Never Used  . Alcohol use No  . Drug use: No  . Sexual activity: Yes    Partners: Male    Birth control/ protection: IUD

## 2016-08-08 ENCOUNTER — Other Ambulatory Visit (INDEPENDENT_AMBULATORY_CARE_PROVIDER_SITE_OTHER): Payer: Self-pay

## 2016-08-08 DIAGNOSIS — I10 Essential (primary) hypertension: Secondary | ICD-10-CM | POA: Diagnosis not present

## 2016-08-08 DIAGNOSIS — F419 Anxiety disorder, unspecified: Secondary | ICD-10-CM | POA: Diagnosis not present

## 2016-08-08 DIAGNOSIS — F329 Major depressive disorder, single episode, unspecified: Secondary | ICD-10-CM | POA: Diagnosis not present

## 2016-08-08 DIAGNOSIS — I878 Other specified disorders of veins: Secondary | ICD-10-CM | POA: Diagnosis not present

## 2016-08-08 DIAGNOSIS — D649 Anemia, unspecified: Secondary | ICD-10-CM | POA: Diagnosis not present

## 2016-08-08 DIAGNOSIS — Z79891 Long term (current) use of opiate analgesic: Secondary | ICD-10-CM | POA: Diagnosis not present

## 2016-08-08 DIAGNOSIS — L97211 Non-pressure chronic ulcer of right calf limited to breakdown of skin: Secondary | ICD-10-CM | POA: Diagnosis not present

## 2016-08-08 DIAGNOSIS — Z86718 Personal history of other venous thrombosis and embolism: Secondary | ICD-10-CM | POA: Diagnosis not present

## 2016-08-08 DIAGNOSIS — E669 Obesity, unspecified: Secondary | ICD-10-CM | POA: Diagnosis not present

## 2016-08-10 ENCOUNTER — Telehealth (INDEPENDENT_AMBULATORY_CARE_PROVIDER_SITE_OTHER): Payer: Self-pay | Admitting: Orthopedic Surgery

## 2016-08-10 DIAGNOSIS — L97211 Non-pressure chronic ulcer of right calf limited to breakdown of skin: Secondary | ICD-10-CM | POA: Diagnosis not present

## 2016-08-10 DIAGNOSIS — I878 Other specified disorders of veins: Secondary | ICD-10-CM | POA: Diagnosis not present

## 2016-08-10 DIAGNOSIS — F329 Major depressive disorder, single episode, unspecified: Secondary | ICD-10-CM | POA: Diagnosis not present

## 2016-08-10 DIAGNOSIS — D649 Anemia, unspecified: Secondary | ICD-10-CM | POA: Diagnosis not present

## 2016-08-10 DIAGNOSIS — I10 Essential (primary) hypertension: Secondary | ICD-10-CM | POA: Diagnosis not present

## 2016-08-10 DIAGNOSIS — F419 Anxiety disorder, unspecified: Secondary | ICD-10-CM | POA: Diagnosis not present

## 2016-08-10 NOTE — Telephone Encounter (Signed)
Please call.

## 2016-08-10 NOTE — Telephone Encounter (Signed)
Advised HHRN Jill that calcium aliginate dressing is comparable to silvercell. They do not have silvercell in supply. I called advised sample silvercell at front desk for pick up. Advised about office closing for holidays Friday and Monday.

## 2016-08-10 NOTE — Telephone Encounter (Signed)
Patient called having a few questions for you. CB # [phone]

## 2016-08-11 ENCOUNTER — Telehealth (INDEPENDENT_AMBULATORY_CARE_PROVIDER_SITE_OTHER): Payer: Self-pay | Admitting: *Deleted

## 2016-08-11 NOTE — Telephone Encounter (Signed)
Pt home health nurse Sharee Pimple requesting rx for silvadene. She cannot order it but pt likes it. [phone].

## 2016-08-11 NOTE — Telephone Encounter (Signed)
I called and spoke with Sharee Pimple to advise it is silvercell not silvadene patient is wearing. This is ordered by  Home health agency.

## 2016-08-12 DIAGNOSIS — F329 Major depressive disorder, single episode, unspecified: Secondary | ICD-10-CM | POA: Diagnosis not present

## 2016-08-12 DIAGNOSIS — I878 Other specified disorders of veins: Secondary | ICD-10-CM | POA: Diagnosis not present

## 2016-08-12 DIAGNOSIS — I10 Essential (primary) hypertension: Secondary | ICD-10-CM | POA: Diagnosis not present

## 2016-08-12 DIAGNOSIS — L97211 Non-pressure chronic ulcer of right calf limited to breakdown of skin: Secondary | ICD-10-CM | POA: Diagnosis not present

## 2016-08-12 DIAGNOSIS — D649 Anemia, unspecified: Secondary | ICD-10-CM | POA: Diagnosis not present

## 2016-08-12 DIAGNOSIS — F419 Anxiety disorder, unspecified: Secondary | ICD-10-CM | POA: Diagnosis not present

## 2016-08-15 DIAGNOSIS — F419 Anxiety disorder, unspecified: Secondary | ICD-10-CM | POA: Diagnosis not present

## 2016-08-15 DIAGNOSIS — I10 Essential (primary) hypertension: Secondary | ICD-10-CM | POA: Diagnosis not present

## 2016-08-15 DIAGNOSIS — I878 Other specified disorders of veins: Secondary | ICD-10-CM | POA: Diagnosis not present

## 2016-08-15 DIAGNOSIS — L97211 Non-pressure chronic ulcer of right calf limited to breakdown of skin: Secondary | ICD-10-CM | POA: Diagnosis not present

## 2016-08-15 DIAGNOSIS — D649 Anemia, unspecified: Secondary | ICD-10-CM | POA: Diagnosis not present

## 2016-08-15 DIAGNOSIS — F329 Major depressive disorder, single episode, unspecified: Secondary | ICD-10-CM | POA: Diagnosis not present

## 2016-08-16 ENCOUNTER — Telehealth (INDEPENDENT_AMBULATORY_CARE_PROVIDER_SITE_OTHER): Payer: Self-pay | Admitting: Orthopedic Surgery

## 2016-08-16 NOTE — Telephone Encounter (Signed)
[facility] nurse calling to let you know that the patient's wound still has an odor to it, but does not show any sign of infection.  Because the patient is independent with the wound care, Jenny Reichmann is requesting that the visits be 1x per week instead of 3x per week.  After speaking with Autumn and relaying the information back to [place], she will note that it is advised the visits should be 2x per week considering the drainage. Please call Cindy at [phone] if any changes are in order.

## 2016-08-17 ENCOUNTER — Telehealth (INDEPENDENT_AMBULATORY_CARE_PROVIDER_SITE_OTHER): Payer: Self-pay | Admitting: Orthopedic Surgery

## 2016-08-17 NOTE — Telephone Encounter (Signed)
Theodosia Quay (Nurse) with [facility] called advised patient refused to see her today because patient is seeing Dr Sharol Given tomorrow.  [name] said she will start back seeing patient next week. The number to contact Ander Purpura is [phone]

## 2016-08-18 ENCOUNTER — Ambulatory Visit (INDEPENDENT_AMBULATORY_CARE_PROVIDER_SITE_OTHER): Payer: Medicare Other | Admitting: Orthopedic Surgery

## 2016-08-18 ENCOUNTER — Encounter (INDEPENDENT_AMBULATORY_CARE_PROVIDER_SITE_OTHER): Payer: Self-pay | Admitting: Orthopedic Surgery

## 2016-08-18 ENCOUNTER — Telehealth (INDEPENDENT_AMBULATORY_CARE_PROVIDER_SITE_OTHER): Payer: Self-pay | Admitting: *Deleted

## 2016-08-18 DIAGNOSIS — I87331 Chronic venous hypertension (idiopathic) with ulcer and inflammation of right lower extremity: Secondary | ICD-10-CM

## 2016-08-18 DIAGNOSIS — L97919 Non-pressure chronic ulcer of unspecified part of right lower leg with unspecified severity: Principal | ICD-10-CM

## 2016-08-18 NOTE — Telephone Encounter (Signed)
[name] from [facility] called this afternoon in regards to needing orders for nursing care? How often a week or if he would like to discharge her from home care? Her CB # [phone]. Thank you

## 2016-08-18 NOTE — Progress Notes (Signed)
Office Visit Note   Patient: Alice [name]           Date of Birth: December 05, 1970           MRN: [id number] Visit Date: 08/18/2016              Requested by: Benito Mccreedy, MD [place] [address] [place], [place] [address] PCP: Benito Mccreedy, MD  Chief Complaint  Patient presents with  . Right Leg - Follow-up    SKIN GRAFT RIGHT LEG WITH THERASKIN APPLICATION 08/30/58      HPI: Patient is status post skin graft right lower extremity. She states he's been using 3 layers of the 4 layer Profore wrap and has not been using the calvarium wrap.  Assessment & Plan: Visit Diagnoses:  1. 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 (HCC)     Plan: Recommend that she use the 4 layer of the Profore wrap discussed that the alginate dressing she has is as good as our silver alginate dressing in the office discussed the importance of elevation weight loss and compression. We will have home health nursing change her compression wraps twice a week follow-up in 2 weeks  Follow-Up Instructions: Return in about 2 weeks (around 09/01/2016).   Ortho Exam  Patient is alert, oriented, no adenopathy, well-dressed, normal affect, normal respiratory effort. Examination the wound bed has excellent integration and beefy tissue of the skin graft. There is no cellulitis no odor no signs of breakdown.  Imaging: No results found.  Labs: Lab Results  Component Value Date   REPTSTATUS 05/17/2013 FINAL 05/17/2013   REPTSTATUS 05/17/2013 FINAL 05/17/2013   REPTSTATUS 05/19/2013 FINAL 05/17/2013   GRAMSTAIN  05/17/2013    MODERATE WBC PRESENT, PREDOMINANTLY MONONUCLEAR MODERATE GRAM POSITIVE COCCI IN PAIRS IN CHAINS RARE GRAM NEGATIVE RODS Gram Stain Report Called to,Read Back By and Verified With: HOPPER,M RN @ [phone] 05/17/13 LEONARD,A   GRAMSTAIN  05/17/2013    MODERATE WBC PRESENT, PREDOMINANTLY MONONUCLEAR MODERATE SQUAMOUS EPITHELIAL CELLS  PRESENT MODERATE GRAM POSITIVE COCCI IN PAIRS IN CHAINS RARE GRAM NEGATIVE RODS Performed at [facility]  05/17/2013    NORMAL OROPHARYNGEAL FLORA Performed at [facility] 09/30/2007    Orders:  No orders of the defined types were placed in this encounter.  No orders of the defined types were placed in this encounter.    Procedures: No procedures performed  Clinical Data: No additional findings.  ROS:  All other systems negative, except as noted in the HPI. Review of Systems  Objective: Vital Signs: There were no vitals taken for this visit.  Specialty Comments:  No specialty comments available.  PMFS History: Patient Active Problem List   Diagnosis Date Noted  . Hx of skin graft 08/01/2016  . 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 ([facility]) 07/05/2016  . Trichomonal infection 11/24/2015  . Abdominal pain 05/02/2015  . Symptomatic cholelithiasis 04/16/2015  . Acute bilateral upper abdominal pain 04/16/2015  . Anemia 05/18/2013  . Hypotension, unspecified 05/17/2013  . CKD (chronic kidney disease), stage III 05/17/2013  . Anal fissure 02/06/2013  . Anal skin tag 02/06/2013  . ALLERGIC RHINITIS 03/05/2010  . LOW BACK PAIN SYNDROME 12/13/2007  . WOUND INFECTION 10/10/2007  . OBESITY, MORBID 12/02/2006  . HTN (hypertension) 12/02/2006  . History of pulmonary  embolism 12/02/2006  . History of DVT (deep vein thrombosis) 12/02/2006  . 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 GASTROESOPHAGEAL REFLUX DISEASE 12/02/2006  . OSA (obstructive sleep apnea) 12/02/2006   Past Medical History:  Diagnosis Date  . Anemia   . Anxiety   . Chronic bronchitis ([facility])   . Chronic upper back pain   . Depression   . DVT (deep venous thrombosis) (HCC)    BLE  . GERD (gastroesophageal  reflux disease)   . Headache    "weekly" (04/16/2015)  . Hypertension   . Migraine    "monthly" (04/16/2015)  . Obesity   . Pneumonia 2014  . Pulmonary embolism ([facility])   . Sinusitis nasal   . Sleep apnea    "I'm suppose to wear a mask but I don't" (04/16/2015)  . Varicose veins of right lower extremity   . Venous stasis of lower extremity    right    Family History  Problem Relation Age of Onset  . Kidney disease Mother     kidney transplant    Past Surgical History:  Procedure Laterality Date  . CHOLECYSTECTOMY N/A 04/18/2015   Procedure: LAPAROSCOPIC CHOLECYSTECTOMY;  Surgeon: Ralene Ok, MD;  Location: [facility];  Service: General;  Laterality: N/A;  . HYSTEROSCOPY W/D&C  12/24/2001   Archie Endo 09/28/2010  . I&D EXTREMITY Right 07/25/2016   Procedure: IRRIGATION AND DEBRIDEMENT RIGHT LEG ULCER, APPLY VERAFLO VAC;  Surgeon: [name], MD;  Location: [facility];  Service: Orthopedics;  Laterality: Right;  . INCISE AND DRAIN ABCESS Right 07/14/2016  . INCISION AND DRAINAGE Right 09/10/2008   leg:  skin and soft tissue and muscle/notes 09/15/2010  . INCISION AND DRAINAGE Right 01/01/2008   Chronic venous stasis insufficiency ulcer,/notes 09/14/2010/  . INCISION AND DRAINAGE Right 11/6732   calf w/application wound vac/notes 07/20/2010  . INCISION AND DRAINAGE Right 07/25/2016   IRRIGATION AND DEBRIDEMENT RIGHT LEG ULCER,  . LAPAROSCOPIC GASTRIC BYPASS  ~ 2007  . SKIN GRAFT SPLIT THICKNESS LEG / FOOT Right 07/25/2016   LEG  . SKIN SPLIT GRAFT Right 07/27/2016   Procedure: SKIN GRAFT RIGHT LEG WITH THERASKIN APPLICATION;  Surgeon: [name], MD;  Location: [facility];  Service: Orthopedics;  Laterality: Right;  . TONSILLECTOMY     Social History   Occupational History  . Not on file.   Social History Main Topics  . Smoking status: Never Smoker  . Smokeless tobacco: Never Used  . Alcohol use No  . Drug use: No  . Sexual activity: Yes    Partners: Male    Birth control/ protection: IUD

## 2016-08-19 ENCOUNTER — Telehealth (INDEPENDENT_AMBULATORY_CARE_PROVIDER_SITE_OTHER): Payer: Self-pay | Admitting: *Deleted

## 2016-08-19 ENCOUNTER — Other Ambulatory Visit (INDEPENDENT_AMBULATORY_CARE_PROVIDER_SITE_OTHER): Payer: Self-pay

## 2016-08-19 NOTE — Telephone Encounter (Signed)
Orders faxed to [facility]

## 2016-08-19 NOTE — Telephone Encounter (Signed)
New order faxed to [facility] [phone]. Per MD still wants silvercell and dynaflex applied twice a week. Patient will follow up in two weeks. Patient was encouraged by MD yesterday that calcium aliginate and profore are all comparable to what we apply in the office.

## 2016-08-19 NOTE — Telephone Encounter (Signed)
[facility] calling for updated orders FAX: [phone]

## 2016-08-22 DIAGNOSIS — F329 Major depressive disorder, single episode, unspecified: Secondary | ICD-10-CM | POA: Diagnosis not present

## 2016-08-22 DIAGNOSIS — D649 Anemia, unspecified: Secondary | ICD-10-CM | POA: Diagnosis not present

## 2016-08-22 DIAGNOSIS — I10 Essential (primary) hypertension: Secondary | ICD-10-CM | POA: Diagnosis not present

## 2016-08-22 DIAGNOSIS — I878 Other specified disorders of veins: Secondary | ICD-10-CM | POA: Diagnosis not present

## 2016-08-22 DIAGNOSIS — L97211 Non-pressure chronic ulcer of right calf limited to breakdown of skin: Secondary | ICD-10-CM | POA: Diagnosis not present

## 2016-08-22 DIAGNOSIS — F419 Anxiety disorder, unspecified: Secondary | ICD-10-CM | POA: Diagnosis not present

## 2016-08-23 IMAGING — US US ABDOMEN LIMITED
1 series · 14 of 25 positions shown · non-contrast
Comparison: CT abdomen pelvis 03/01/2015

CLINICAL DATA: Epigastric pain, history of gastric bypass,
hypertension, obesity

EXAM:
US ABDOMEN LIMITED - RIGHT UPPER QUADRANT

[Series 1: us abdomen limited · 0.27mm/px · 14 of 60 slices shown]
[im 1/60]
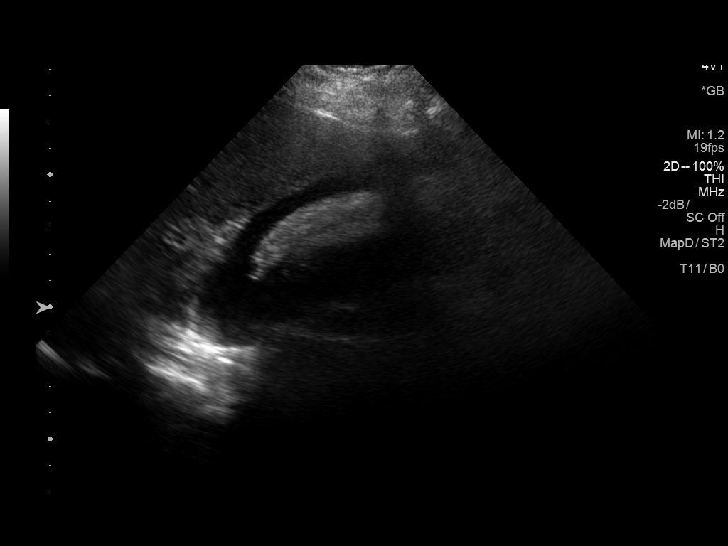
[im 5/60]
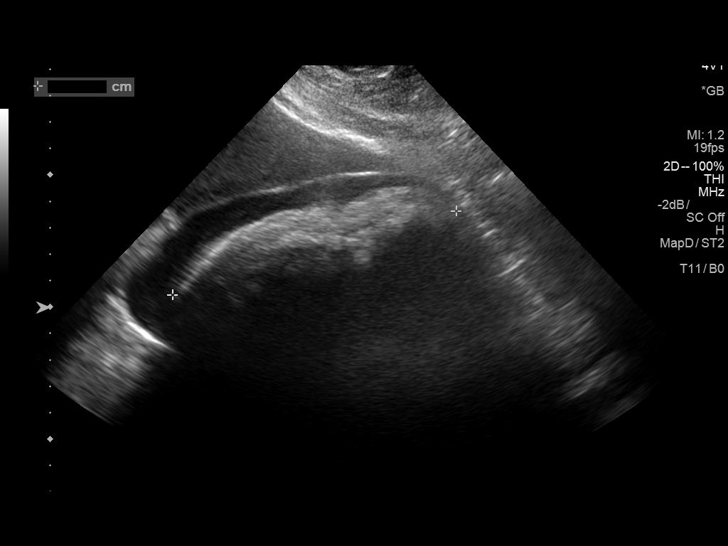
[im 10/60]
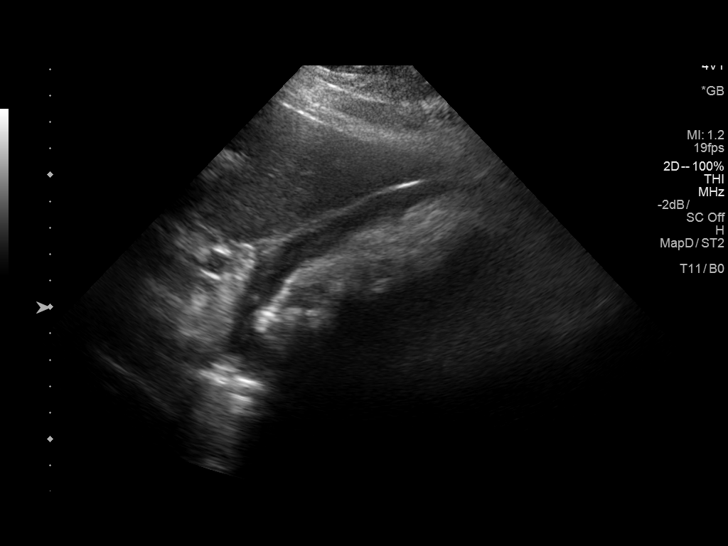
[im 15/60]
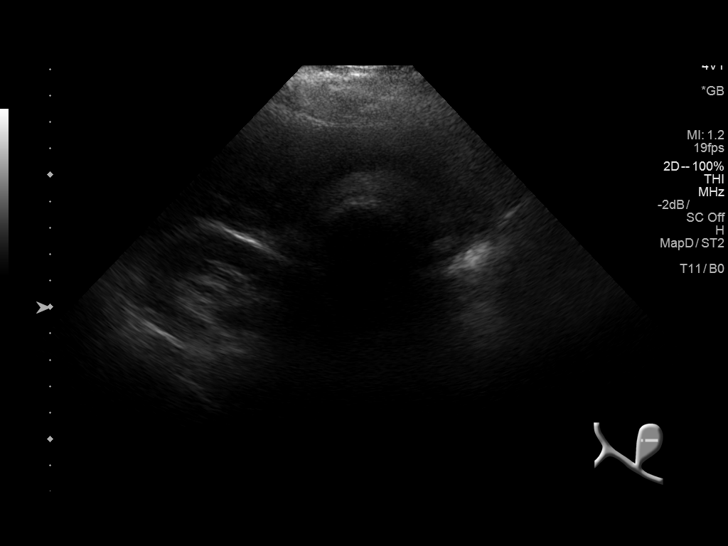
[im 20/60]
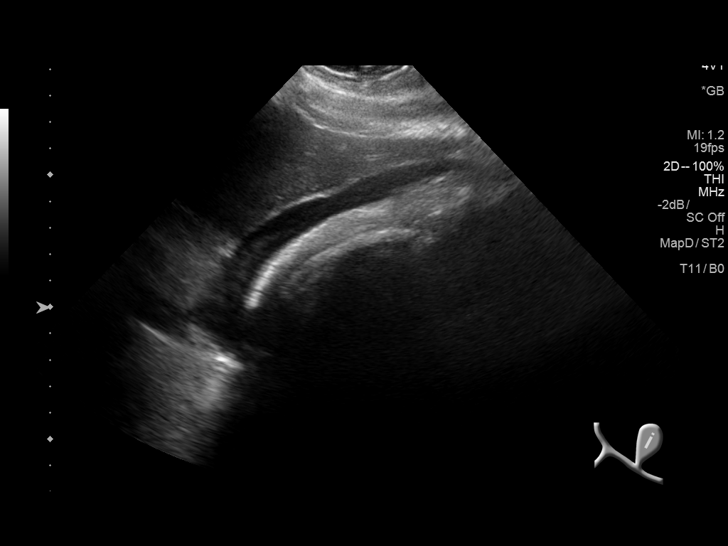
[im 23/60]
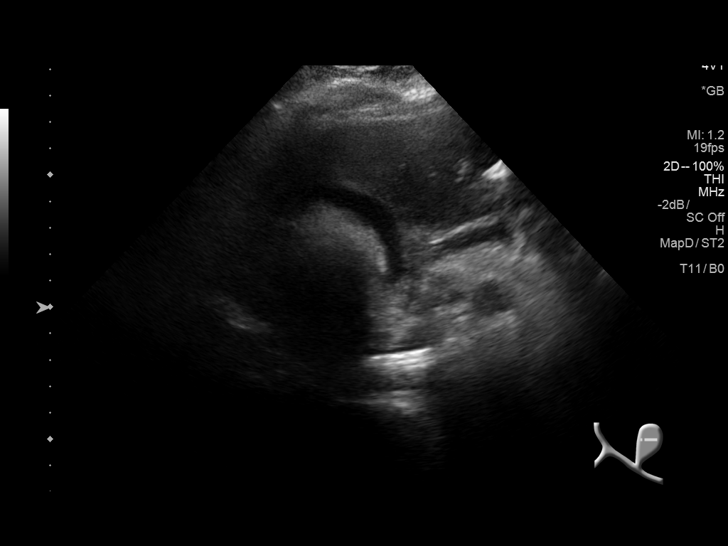
[im 28/60]
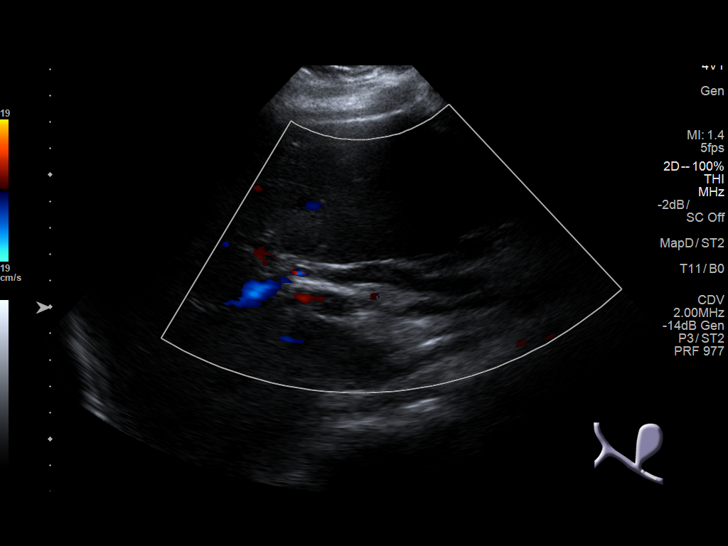
[im 32/60]
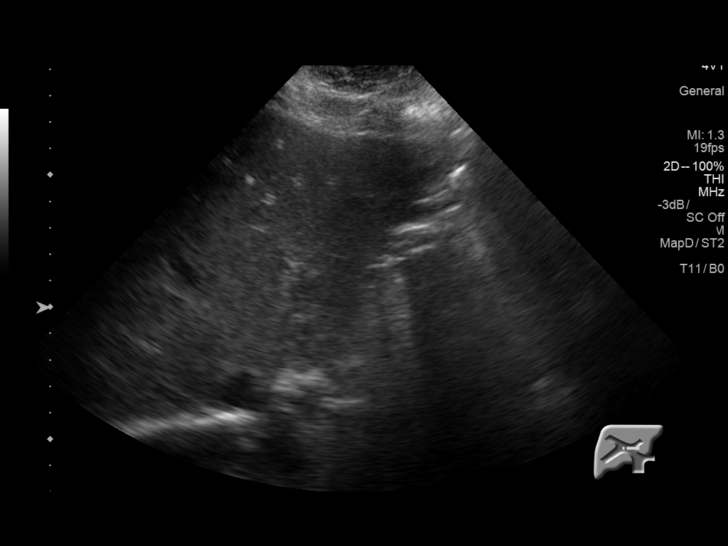
[im 37/60]
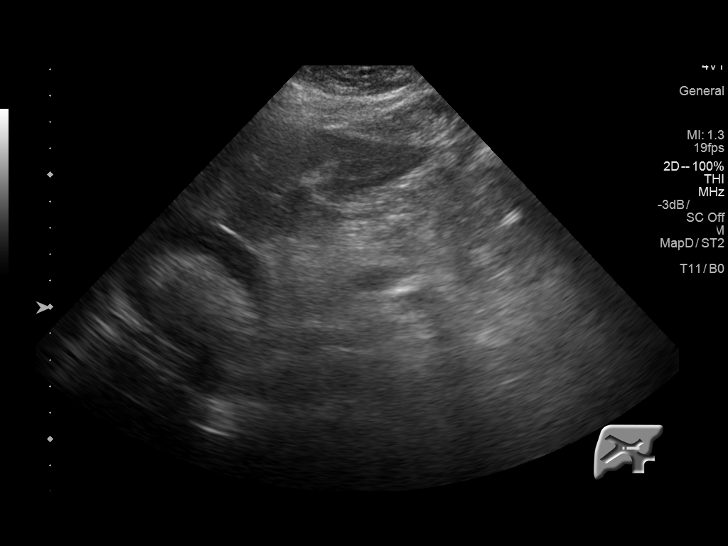
[im 40/60]
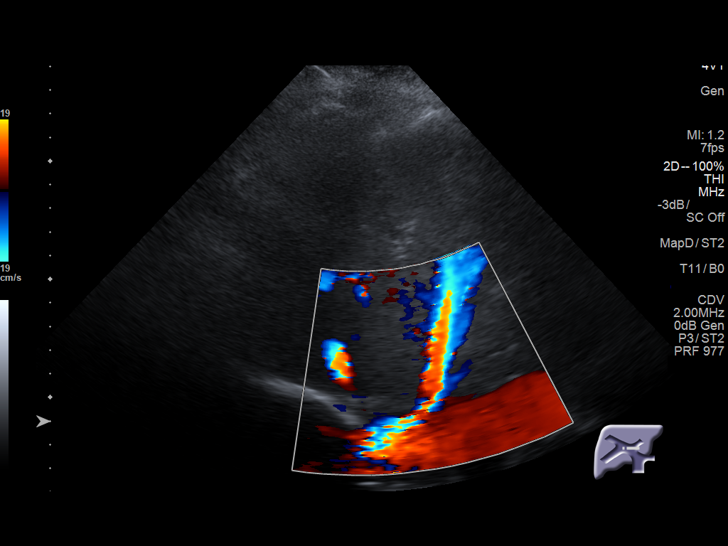
[im 45/60]
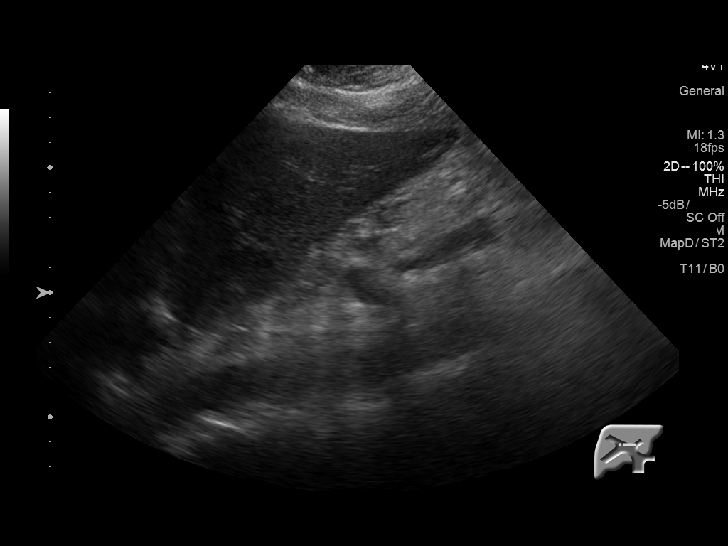
[im 50/60]
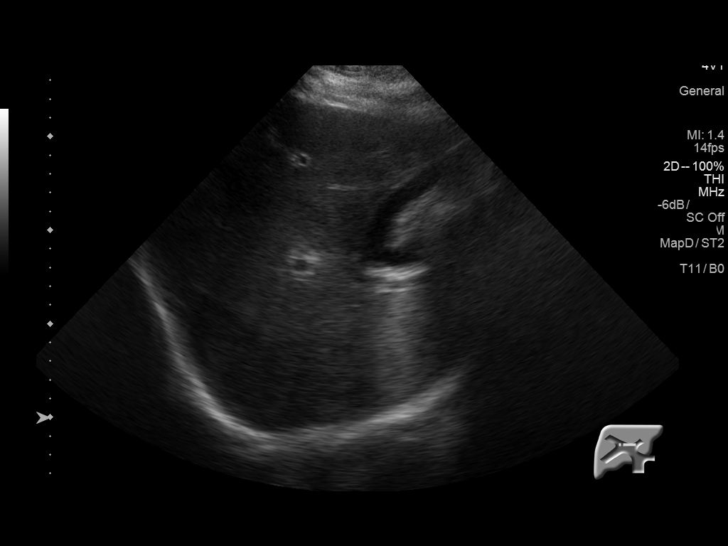
[im 55/60]
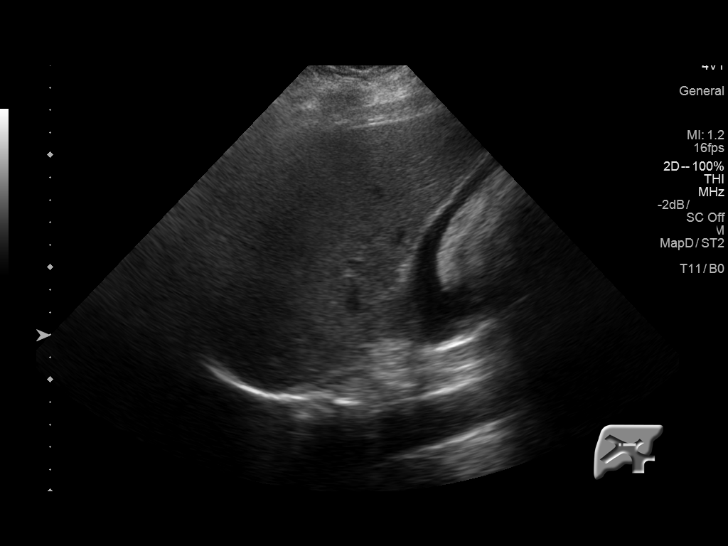
[im 60/60]
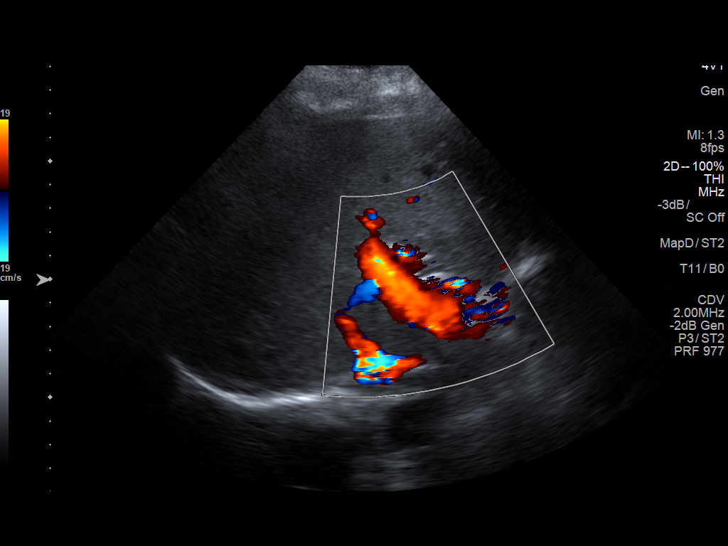

[14 of 25 positions shown; findings below may reference images not displayed]

FINDINGS: Gallbladder:

Large shadowing echogenic focus within gallbladder 11.2 cm length
most consistent with cholelithiasis. The presence of shadowing makes
this less likely to represent a large sludge ball. Gallbladder is
distended, 12.9 cm length. No gallbladder wall thickening or
pericholecystic fluid. Equivocal sonographic Murphy sign.

Common bile duct:

Diameter: 8 mm mildly dilated for age

Liver:

Normal appearance

Image quality degraded secondary to body habitus.

No RIGHT upper quadrant free fluid.
IMPRESSION: Distended gallbladder containing an 11.2 mm diameter shadowing focus
most likely representing cholelithiasis.

Equivocal sonographic Murphy sign without evidence of gallbladder
wall thickening.

Mild biliary dilatation for age; recommend correlation with LFTs.

## 2016-08-25 DIAGNOSIS — D649 Anemia, unspecified: Secondary | ICD-10-CM | POA: Diagnosis not present

## 2016-08-25 DIAGNOSIS — L97211 Non-pressure chronic ulcer of right calf limited to breakdown of skin: Secondary | ICD-10-CM | POA: Diagnosis not present

## 2016-08-25 DIAGNOSIS — F329 Major depressive disorder, single episode, unspecified: Secondary | ICD-10-CM | POA: Diagnosis not present

## 2016-08-25 DIAGNOSIS — F419 Anxiety disorder, unspecified: Secondary | ICD-10-CM | POA: Diagnosis not present

## 2016-08-25 DIAGNOSIS — I878 Other specified disorders of veins: Secondary | ICD-10-CM | POA: Diagnosis not present

## 2016-08-25 DIAGNOSIS — I10 Essential (primary) hypertension: Secondary | ICD-10-CM | POA: Diagnosis not present

## 2016-08-29 ENCOUNTER — Telehealth (INDEPENDENT_AMBULATORY_CARE_PROVIDER_SITE_OTHER): Payer: Self-pay | Admitting: Orthopedic Surgery

## 2016-08-29 NOTE — Telephone Encounter (Signed)
Returned call to [place] with [facility] left message to return call concerning message she left this morning concerning patient.  [phone]

## 2016-09-01 ENCOUNTER — Ambulatory Visit (INDEPENDENT_AMBULATORY_CARE_PROVIDER_SITE_OTHER): Payer: Medicare Other | Admitting: Orthopedic Surgery

## 2016-09-01 ENCOUNTER — Encounter (INDEPENDENT_AMBULATORY_CARE_PROVIDER_SITE_OTHER): Payer: Self-pay | Admitting: Orthopedic Surgery

## 2016-09-01 VITALS — Ht 68.0 in | Wt 340.0 lb

## 2016-09-01 DIAGNOSIS — L97919 Non-pressure chronic ulcer of unspecified part of right lower leg with unspecified severity: Principal | ICD-10-CM

## 2016-09-01 DIAGNOSIS — I87331 Chronic venous hypertension (idiopathic) with ulcer and inflammation of right lower extremity: Secondary | ICD-10-CM
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
## 2016-09-05 DIAGNOSIS — D649 Anemia, unspecified: Secondary | ICD-10-CM | POA: Diagnosis not present

## 2016-09-05 DIAGNOSIS — I10 Essential (primary) hypertension: Secondary | ICD-10-CM | POA: Diagnosis not present

## 2016-09-05 DIAGNOSIS — F419 Anxiety disorder, unspecified: Secondary | ICD-10-CM | POA: Diagnosis not present

## 2016-09-05 DIAGNOSIS — L97211 Non-pressure chronic ulcer of right calf limited to breakdown of skin: Secondary | ICD-10-CM | POA: Diagnosis not present

## 2016-09-05 DIAGNOSIS — I878 Other specified disorders of veins: Secondary | ICD-10-CM | POA: Diagnosis not present

## 2016-09-05 DIAGNOSIS — F329 Major depressive disorder, single episode, unspecified: Secondary | ICD-10-CM | POA: Diagnosis not present

## 2016-09-08 DIAGNOSIS — I10 Essential (primary) hypertension: Secondary | ICD-10-CM | POA: Diagnosis not present

## 2016-09-08 DIAGNOSIS — F329 Major depressive disorder, single episode, unspecified: Secondary | ICD-10-CM | POA: Diagnosis not present

## 2016-09-08 DIAGNOSIS — F419 Anxiety disorder, unspecified: Secondary | ICD-10-CM | POA: Diagnosis not present

## 2016-09-08 DIAGNOSIS — L97211 Non-pressure chronic ulcer of right calf limited to breakdown of skin: Secondary | ICD-10-CM | POA: Diagnosis not present

## 2016-09-08 DIAGNOSIS — D649 Anemia, unspecified: Secondary | ICD-10-CM | POA: Diagnosis not present

## 2016-09-08 DIAGNOSIS — I878 Other specified disorders of veins: Secondary | ICD-10-CM | POA: Diagnosis not present

## 2016-09-15 ENCOUNTER — Encounter (INDEPENDENT_AMBULATORY_CARE_PROVIDER_SITE_OTHER): Payer: Medicare Other | Admitting: Orthopedic Surgery

## 2016-09-15 ENCOUNTER — Encounter (INDEPENDENT_AMBULATORY_CARE_PROVIDER_SITE_OTHER): Payer: Self-pay | Admitting: Orthopedic Surgery

## 2016-09-15 VITALS — Ht 68.0 in | Wt 340.0 lb

## 2016-09-19 ENCOUNTER — Ambulatory Visit (INDEPENDENT_AMBULATORY_CARE_PROVIDER_SITE_OTHER): Payer: Medicare Other | Admitting: Orthopedic Surgery

## 2016-09-19 ENCOUNTER — Encounter (INDEPENDENT_AMBULATORY_CARE_PROVIDER_SITE_OTHER): Payer: Self-pay | Admitting: Orthopedic Surgery

## 2016-09-19 ENCOUNTER — Encounter (INDEPENDENT_AMBULATORY_CARE_PROVIDER_SITE_OTHER): Payer: Self-pay

## 2016-09-19 VITALS — Ht 68.0 in | Wt 340.0 lb

## 2016-09-19 DIAGNOSIS — I87331 Chronic venous hypertension (idiopathic) with ulcer and inflammation of right lower extremity: Secondary | ICD-10-CM

## 2016-09-19 DIAGNOSIS — L97919 Non-pressure chronic ulcer of unspecified part of right lower leg with unspecified severity: Principal | ICD-10-CM

## 2016-09-19 NOTE — Progress Notes (Signed)
Office Visit Note   Patient: Alice [name]           Date of Birth: 28-Jul-1970           MRN: [id number] Visit Date: 09/19/2016              Requested by: Benito Mccreedy, MD [facility] [address] [place], [place] [address] PCP: Benito Mccreedy, MD  Chief Complaint  Patient presents with  . Right Leg - Follow-up    07/27/16 Skin graft right leg with theraskin application 54 days post op      HPI: Patient was visit follow-up status post skin graft and wound VAC treatment for chronic venous ulcer right leg. Currently is home health nursing performing a Profore wrap at home weekly.  Assessment & Plan: Visit Diagnoses:  1. 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 We'll apply a silver cell plus a Dynaflex wrap today.  Follow-Up Instructions: Return in about 2 weeks (around 10/03/2016).   Ortho Exam  Patient is alert, oriented, no adenopathy, well-dressed, normal affect, normal respiratory effort. Examination the wound has good granulation tissue there is a +25% fibrinous exudative tissue there is new epithelialization around the wound edges. The wound is 10 x 10 cm. There is no odor no drainage no cellulitis no signs of infection.  Imaging: No results found.  Labs: Lab Results  Component Value Date   REPTSTATUS 05/17/2013 FINAL 05/17/2013   REPTSTATUS 05/17/2013 FINAL 05/17/2013   REPTSTATUS 05/19/2013 FINAL 05/17/2013   GRAMSTAIN  05/17/2013    MODERATE WBC PRESENT, PREDOMINANTLY MONONUCLEAR MODERATE GRAM POSITIVE COCCI IN PAIRS IN CHAINS RARE GRAM NEGATIVE RODS Gram Stain Report Called to,Read Back By and Verified With: HOPPER,M RN @ [phone] 05/17/13 LEONARD,A   GRAMSTAIN  05/17/2013    MODERATE WBC PRESENT, PREDOMINANTLY MONONUCLEAR MODERATE SQUAMOUS EPITHELIAL CELLS PRESENT MODERATE GRAM POSITIVE COCCI IN PAIRS IN CHAINS RARE GRAM  NEGATIVE RODS Performed at [facility]  05/17/2013    NORMAL OROPHARYNGEAL FLORA Performed at [facility] 09/30/2007    Orders:  No orders of the defined types were placed in this encounter.  No orders of the defined types were placed in this encounter.    Procedures: No procedures performed  Clinical Data: No additional findings.  ROS:  All other systems negative, except as noted in the HPI. Review of Systems  Objective: Vital Signs: Ht 5\' 8"  (1.727 m)   Wt (!) 340 lb (154.2 kg)   BMI 51.70 kg/m   Specialty Comments:  No specialty comments available.  PMFS History: Patient Active Problem List   Diagnosis Date Noted  . Hx of skin graft 08/01/2016  . 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 ([employer]) 07/25/2016  . 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 Trichomonal infection 11/24/2015  . Abdominal pain 05/02/2015  . Symptomatic cholelithiasis 04/16/2015  . Acute bilateral upper abdominal pain 04/16/2015  . Anemia 05/18/2013  . Hypotension, unspecified 05/17/2013  . CKD (chronic kidney disease), stage III 05/17/2013  . Anal fissure 02/06/2013  . Anal skin tag 02/06/2013  . ALLERGIC RHINITIS 03/05/2010  . LOW BACK PAIN SYNDROME 12/13/2007  . WOUND INFECTION 10/10/2007  . OBESITY, MORBID 12/02/2006  . HTN (hypertension) 12/02/2006  . History of pulmonary embolism 12/02/2006  .  History of DVT (deep vein thrombosis) 12/02/2006  . 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 GASTROESOPHAGEAL REFLUX DISEASE 12/02/2006  . OSA (obstructive sleep apnea) 12/02/2006   Past Medical History:  Diagnosis Date  . Anemia   . Anxiety   . Chronic bronchitis ([facility])   . Chronic upper back pain   . Depression   . DVT (deep venous thrombosis) (HCC)    BLE  . GERD (gastroesophageal reflux disease)   . Headache    "weekly"  (04/16/2015)  . Hypertension   . Migraine    "monthly" (04/16/2015)  . Obesity   . Pneumonia 2014  . Pulmonary embolism ([facility])   . Sinusitis nasal   . Sleep apnea    "I'm suppose to wear a mask but I don't" (04/16/2015)  . Varicose veins of right lower extremity   . Venous stasis of lower extremity    right    Family History  Problem Relation Age of Onset  . Kidney disease Mother     kidney transplant    Past Surgical History:  Procedure Laterality Date  . CHOLECYSTECTOMY N/A 04/18/2015   Procedure: LAPAROSCOPIC CHOLECYSTECTOMY;  Surgeon: Ralene Ok, MD;  Location: [facility];  Service: General;  Laterality: N/A;  . HYSTEROSCOPY W/D&C  12/24/2001   Archie Endo 09/28/2010  . I&D EXTREMITY Right 07/25/2016   Procedure: IRRIGATION AND DEBRIDEMENT RIGHT LEG ULCER, APPLY VERAFLO VAC;  Surgeon: [name], MD;  Location: [facility];  Service: Orthopedics;  Laterality: Right;  . INCISE AND DRAIN ABCESS Right 07/14/2016  . INCISION AND DRAINAGE Right 09/10/2008   leg:  skin and soft tissue and muscle/notes 09/15/2010  . INCISION AND DRAINAGE Right 01/01/2008   Chronic venous stasis insufficiency ulcer,/notes 09/14/2010/  . INCISION AND DRAINAGE Right 0/1749   calf w/application wound vac/notes 07/20/2010  . INCISION AND DRAINAGE Right 07/25/2016   IRRIGATION AND DEBRIDEMENT RIGHT LEG ULCER,  . LAPAROSCOPIC GASTRIC BYPASS  ~ 2007  . SKIN GRAFT SPLIT THICKNESS LEG / FOOT Right 07/25/2016   LEG  . SKIN SPLIT GRAFT Right 07/27/2016   Procedure: SKIN GRAFT RIGHT LEG WITH THERASKIN APPLICATION;  Surgeon: [name], MD;  Location: [facility];  Service: Orthopedics;  Laterality: Right;  . TONSILLECTOMY     Social History   Occupational History  . Not on file.   Social History Main Topics  . Smoking status: Never Smoker  . Smokeless tobacco: Never Used  . Alcohol use No  . Drug use: No  . Sexual activity: Yes    Partners: Male    Birth control/ protection: IUD

## 2016-09-22 DIAGNOSIS — F419 Anxiety disorder, unspecified: Secondary | ICD-10-CM | POA: Diagnosis not present

## 2016-09-22 DIAGNOSIS — I10 Essential (primary) hypertension: Secondary | ICD-10-CM | POA: Diagnosis not present

## 2016-09-22 DIAGNOSIS — I878 Other specified disorders of veins: Secondary | ICD-10-CM | POA: Diagnosis not present

## 2016-09-22 DIAGNOSIS — L97211 Non-pressure chronic ulcer of right calf limited to breakdown of skin: Secondary | ICD-10-CM | POA: Diagnosis not present

## 2016-09-22 DIAGNOSIS — F329 Major depressive disorder, single episode, unspecified: Secondary | ICD-10-CM | POA: Diagnosis not present

## 2016-09-22 DIAGNOSIS — D649 Anemia, unspecified: Secondary | ICD-10-CM | POA: Diagnosis not present

## 2016-09-29 DIAGNOSIS — L97211 Non-pressure chronic ulcer of right calf limited to breakdown of skin: Secondary | ICD-10-CM | POA: Diagnosis not present

## 2016-09-29 DIAGNOSIS — I10 Essential (primary) hypertension: Secondary | ICD-10-CM | POA: Diagnosis not present

## 2016-09-29 DIAGNOSIS — D649 Anemia, unspecified: Secondary | ICD-10-CM | POA: Diagnosis not present

## 2016-09-29 DIAGNOSIS — I878 Other specified disorders of veins: Secondary | ICD-10-CM | POA: Diagnosis not present

## 2016-09-29 DIAGNOSIS — F329 Major depressive disorder, single episode, unspecified: Secondary | ICD-10-CM | POA: Diagnosis not present

## 2016-09-29 DIAGNOSIS — F419 Anxiety disorder, unspecified: Secondary | ICD-10-CM | POA: Diagnosis not present

## 2016-10-01 ENCOUNTER — Encounter (HOSPITAL_COMMUNITY): Payer: Self-pay | Admitting: Emergency Medicine

## 2016-10-01 DIAGNOSIS — N183 Chronic kidney disease, stage 3 (moderate): Secondary | ICD-10-CM | POA: Diagnosis not present

## 2016-10-01 DIAGNOSIS — I129 Hypertensive chronic kidney disease with stage 1 through stage 4 chronic kidney disease, or unspecified chronic kidney disease: Secondary | ICD-10-CM | POA: Insufficient documentation

## 2016-10-01 DIAGNOSIS — E876 Hypokalemia: Secondary | ICD-10-CM | POA: Insufficient documentation

## 2016-10-01 DIAGNOSIS — Z7901 Long term (current) use of anticoagulants: Secondary | ICD-10-CM | POA: Diagnosis not present

## 2016-10-01 DIAGNOSIS — Z79899 Other long term (current) drug therapy: Secondary | ICD-10-CM | POA: Insufficient documentation

## 2016-10-01 DIAGNOSIS — L03211 Cellulitis of face: Secondary | ICD-10-CM | POA: Insufficient documentation

## 2016-10-01 DIAGNOSIS — K0889 Other specified disorders of teeth and supporting structures: Secondary | ICD-10-CM | POA: Diagnosis present

## 2016-10-01 DIAGNOSIS — K047 Periapical abscess without sinus: Secondary | ICD-10-CM | POA: Diagnosis not present

## 2016-10-01 DIAGNOSIS — R221 Localized swelling, mass and lump, neck: Secondary | ICD-10-CM | POA: Diagnosis not present

## 2016-10-01 DIAGNOSIS — K122 Cellulitis and abscess of mouth: Secondary | ICD-10-CM | POA: Diagnosis not present

## 2016-10-01 NOTE — ED Triage Notes (Signed)
Pt c/o 10/10 lower left dental pain that started yesterday, today her face is very swollen and her pain is worse.

## 2016-10-02 ENCOUNTER — Emergency Department (HOSPITAL_COMMUNITY): Payer: Medicare Other

## 2016-10-02 ENCOUNTER — Emergency Department (HOSPITAL_COMMUNITY)
Admission: EM | Admit: 2016-10-02 | Discharge: 2016-10-02 | Disposition: A | Discharge status: Home / Self Care | Payer: Medicare Other | Attending: Emergency Medicine | Admitting: Emergency Medicine

## 2016-10-02 DIAGNOSIS — K047 Periapical abscess without sinus: Secondary | ICD-10-CM

## 2016-10-02 DIAGNOSIS — L03211 Cellulitis of face: Secondary | ICD-10-CM

## 2016-10-02 DIAGNOSIS — R221 Localized swelling, mass and lump, neck: Secondary | ICD-10-CM | POA: Diagnosis not present

## 2016-10-02 DIAGNOSIS — K122 Cellulitis and abscess of mouth: Secondary | ICD-10-CM | POA: Diagnosis not present

## 2016-10-02 DIAGNOSIS — E876 Hypokalemia: Secondary | ICD-10-CM

## 2016-10-02 DIAGNOSIS — R791 Abnormal coagulation profile: Secondary | ICD-10-CM

## 2016-10-02 LAB — CBC WITH DIFFERENTIAL/PLATELET
Basophils Absolute: 0 10*3/uL (ref 0.0–0.1)
Basophils Relative: 0 %
EOS ABS: 0.1 10*3/uL (ref 0.0–0.7)
Eosinophils Relative: 2 %
HEMATOCRIT: 30.9 % — AB (ref 36.0–46.0)
HEMOGLOBIN: 9.9 g/dL — AB (ref 12.0–15.0)
LYMPHS ABS: 2.1 10*3/uL (ref 0.7–4.0)
LYMPHS PCT: 29 %
MCH: 24.1 pg — AB (ref 26.0–34.0)
MCHC: 32 g/dL (ref 30.0–36.0)
MCV: 75.2 fL — ABNORMAL LOW (ref 78.0–100.0)
Monocytes Absolute: 0.5 10*3/uL (ref 0.1–1.0)
Monocytes Relative: 7 %
Neutro Abs: 4.6 10*3/uL (ref 1.7–7.7)
Neutrophils Relative %: 62 %
Platelets: 405 10*3/uL — ABNORMAL HIGH (ref 150–400)
RBC: 4.11 MIL/uL (ref 3.87–5.11)
RDW: 14.9 % (ref 11.5–15.5)
WBC: 7.4 10*3/uL (ref 4.0–10.5)

## 2016-10-02 LAB — BASIC METABOLIC PANEL
Anion gap: 9 (ref 5–15)
BUN: 11 mg/dL (ref 6–20)
CO2: 25 mmol/L (ref 22–32)
Calcium: 8.9 mg/dL (ref 8.9–10.3)
Chloride: 103 mmol/L (ref 101–111)
Creatinine, Ser: 1.15 mg/dL — ABNORMAL HIGH (ref 0.44–1.00)
GFR calc non Af Amer: 57 mL/min — ABNORMAL LOW (ref >60)
Glucose, Bld: 99 mg/dL (ref 65–99)
POTASSIUM: 2.8 mmol/L — AB (ref 3.5–5.1)
SODIUM: 137 mmol/L (ref 135–145)

## 2016-10-02 LAB — PROTIME-INR
INR: 4.48 — AB
PROTHROMBIN TIME: 43.8 s — AB (ref 11.4–15.2)

## 2016-10-02 MED ORDER — CLINDAMYCIN HCL 300 MG PO CAPS: 300.0000 mg | ORAL_CAPSULE | Freq: Four times a day (QID) | ORAL | 0 refills | Status: DC

## 2016-10-02 MED ORDER — SODIUM CHLORIDE 0.9 % IV BOLUS (SEPSIS)
1000.0000 mL | Freq: Once | INTRAVENOUS | Status: AC
  Administered 2016-10-02: 1000 mL via INTRAVENOUS

## 2016-10-02 MED ORDER — OXYCODONE HCL 5 MG PO TABS: 5.0000 mg | ORAL_TABLET | ORAL | 0 refills | Status: DC | PRN

## 2016-10-02 MED ORDER — IOPAMIDOL (ISOVUE-300) INJECTION 61%
INTRAVENOUS | Status: AC
  Administered 2016-10-02: 75 mL
  Filled 2016-10-02: qty 75

## 2016-10-02 MED ORDER — CLINDAMYCIN PHOSPHATE 600 MG/50ML IV SOLN
600.0000 mg | Freq: Once | INTRAVENOUS | Status: AC
  Administered 2016-10-02: 600 mg via INTRAVENOUS
  Filled 2016-10-02: qty 50

## 2016-10-02 MED ORDER — POTASSIUM CHLORIDE CRYS ER 20 MEQ PO TBCR: 20.0000 meq | EXTENDED_RELEASE_TABLET | Freq: Two times a day (BID) | ORAL | 0 refills | Status: DC

## 2016-10-02 MED ORDER — FENTANYL CITRATE (PF) 100 MCG/2ML IJ SOLN
100.0000 ug | Freq: Once | INTRAMUSCULAR | Status: AC
  Administered 2016-10-02: 100 ug via INTRAVENOUS
  Filled 2016-10-02: qty 2

## 2016-10-02 MED ORDER — POTASSIUM CHLORIDE 20 MEQ/15ML (10%) PO SOLN
40.0000 meq | Freq: Once | ORAL | Status: AC
  Administered 2016-10-02: 40 meq via ORAL
  Filled 2016-10-02: qty 30

## 2016-10-02 MED ORDER — OXYCODONE-ACETAMINOPHEN 5-325 MG PO TABS
2.0000 | ORAL_TABLET | Freq: Once | ORAL | Status: AC
  Administered 2016-10-02: 2 via ORAL
  Filled 2016-10-02: qty 2

## 2016-10-02 NOTE — ED Notes (Signed)
Pt reports her tooth started to hurt her yesterday and the swelling began today.

## 2016-10-02 NOTE — Discharge Instructions (Signed)
Your INR today is 4.5. Do not take your coumadin tonight (5/20) and follow up closely with your primary doctor for a recheck of your INR

## 2016-10-02 NOTE — ED Notes (Signed)
PT states her pain has returned and is a 10 again.

## 2016-10-02 NOTE — ED Provider Notes (Signed)
[facility] DEPT Provider Note   CSN: [id number] Arrival date & time: 10/01/16  2323  By signing my name below, I, Alice [name], attest that this documentation has been prepared under the direction and in the presence of Alice Gambler, MD . Electronically Signed: Dolores [name], Scribe. 10/02/2016. 1:14 AM.  History   Chief Complaint Chief Complaint  Patient presents with  . Dental Pain    facial swollen   The history is provided by the patient. No language interpreter was used.    HPI Comments:  Alice [name] is a 46 y.o. female with pmhx of DVT and HTN who presents to the Emergency Department complaining of constant, severe dental pain onset two days ago. Pt describes her pain as 10/10, stabbing, and worse on the lower left side. Pt notes that she has had similar dental pain about 1 week ago but it went away without any medical intervention and so she did not see anyone for her symptoms. She reports associated facial swelling that started today. Tried ibuprofen without relief. Denies any SOB, fever, vomiting, trouble swallowing or drooling. Pt is compliant with her blood-thinning medication, coumadin.  Past Medical History:  Diagnosis Date  . Anemia   . Anxiety   . Chronic bronchitis ([place])   . Chronic upper back pain   . Depression   . DVT (deep venous thrombosis) (HCC)    BLE  . GERD (gastroesophageal reflux disease)   . Headache    "weekly" (04/16/2015)  . Hypertension   . Migraine    "monthly" (04/16/2015)  . Obesity   . Pneumonia 2014  . Pulmonary embolism ([place])   . Sinusitis nasal   . Sleep apnea    "I'm suppose to wear a mask but I don't" (04/16/2015)  . Varicose veins of right lower extremity   . Venous stasis of lower extremity    right    Patient Active Problem List   Diagnosis Date Noted  . Hx of skin graft 08/01/2016  . 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 ([place]) 07/25/2016  . 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 Trichomonal infection 11/24/2015  . Abdominal pain 05/02/2015  . Symptomatic cholelithiasis 04/16/2015  . Acute bilateral upper abdominal pain 04/16/2015  . Anemia 05/18/2013  . Hypotension, unspecified 05/17/2013  . CKD (chronic kidney disease), stage III 05/17/2013  . Anal fissure 02/06/2013  . Anal skin tag 02/06/2013  . ALLERGIC RHINITIS 03/05/2010  . LOW BACK PAIN SYNDROME 12/13/2007  . WOUND INFECTION 10/10/2007  . OBESITY, MORBID 12/02/2006  . HTN (hypertension) 12/02/2006  . History of pulmonary embolism 12/02/2006  . History of DVT (deep vein thrombosis) 12/02/2006  . 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 GASTROESOPHAGEAL REFLUX DISEASE 12/02/2006  . OSA (obstructive sleep apnea) 12/02/2006    Past Surgical History:  Procedure Laterality Date  . CHOLECYSTECTOMY N/A 04/18/2015   Procedure: LAPAROSCOPIC CHOLECYSTECTOMY;  Surgeon: Ralene Ok, MD;  Location: [facility];  Service: General;  Laterality: N/A;  . HYSTEROSCOPY W/D&C  12/24/2001   Archie Endo 09/28/2010  . I&D EXTREMITY Right 07/25/2016   Procedure: IRRIGATION AND DEBRIDEMENT RIGHT LEG ULCER, APPLY VERAFLO VAC;  Surgeon: [name], MD;  Location: [facility];  Service: Orthopedics;  Laterality: Right;  . INCISE AND DRAIN ABCESS Right 07/14/2016  . INCISION AND DRAINAGE Right 09/10/2008   leg:  skin and soft tissue and muscle/notes 09/15/2010  . INCISION AND DRAINAGE Right 01/01/2008  Chronic venous stasis insufficiency ulcer,/notes 09/14/2010/  . INCISION AND DRAINAGE Right 05/9377   calf w/application wound vac/notes 07/20/2010  . INCISION AND DRAINAGE Right 07/25/2016   IRRIGATION AND DEBRIDEMENT RIGHT LEG ULCER,  . LAPAROSCOPIC GASTRIC BYPASS  ~ 2007  . SKIN GRAFT SPLIT THICKNESS LEG / FOOT Right 07/25/2016   LEG  . SKIN SPLIT GRAFT Right 07/27/2016   Procedure: SKIN GRAFT RIGHT LEG WITH THERASKIN APPLICATION;  Surgeon: [name], MD;  Location: [facility];  Service: Orthopedics;  Laterality: Right;  . TONSILLECTOMY      OB History    Gravida Para Term Preterm AB Living   0 0 0 0 0 0   SAB TAB Ectopic Multiple Live Births   0 0 0 0         Home Medications    Prior to Admission medications   Medication Sig Start Date End Date Taking? Authorizing Provider  albuterol (PROVENTIL HFA;VENTOLIN HFA) 108 (90 Base) MCG/ACT inhaler Inhale 3 puffs into the lungs every 6 (six) hours as needed for wheezing or shortness of breath.   Yes [provider]  amLODipine (NORVASC) 10 MG tablet Take 1 tablet (10 mg total) by mouth daily. 05/20/13  Yes [name], MD  buPROPion (WELLBUTRIN XL) 150 MG 24 hr tablet Take 150 mg by mouth daily.   Yes [provider]  Cholecalciferol (VITAMIN D3) 5000 units CAPS Take 1 capsule by mouth daily.    Yes [provider]  gabapentin (NEURONTIN) 300 MG capsule Take 300 mg by mouth at bedtime.  04/13/16  Yes [provider]  hydrochlorothiazide (HYDRODIURIL) 25 MG tablet Take 25 mg by mouth daily.   Yes [provider]  levonorgestrel (MIRENA) 20 MCG/24HR IUD 1 each by Intrauterine route once. Placed in 2013.   Yes [provider]  phenazopyridine (PYRIDIUM) 200 MG tablet Take 1 tablet (200 mg total) by mouth 3 (three) times daily. 11/18/15  Yes Rasch, Anderson Malta I, NP  phentermine 37.5 MG capsule Take 37.5 mg by mouth daily.   Yes [provider]  sulindac (CLINORIL) 200 MG tablet Take 200 mg by mouth 3 (three) times daily.  06/09/16  Yes [provider]  warfarin (COUMADIN) 2.5 MG tablet Take 5 tablets (12.5 mg total) by mouth one time only at 6 PM. Take daily until INR checked and then per PCP. Patient taking differently: Take 10 mg by mouth daily.  05/20/13  Yes [name], MD  clindamycin (CLEOCIN) 300 MG capsule Take 1 capsule (300 mg total) by mouth 4 (four) times daily. X 7 days 10/02/16   Alice Gambler, MD  oxyCODONE (ROXICODONE) 5  MG immediate release tablet Take 1-2 tablets (5-10 mg total) by mouth every 4 (four) hours as needed for severe pain. 10/02/16   Alice Gambler, MD  potassium chloride SA (K-DUR,KLOR-CON) 20 MEQ tablet Take 1 tablet (20 mEq total) by mouth 2 (two) times daily. 10/02/16   Alice Gambler, MD    Family History Family History  Problem Relation Age of Onset  . Kidney disease Mother        kidney transplant    Social History Social History  Substance Use Topics  . Smoking status: Never Smoker  . Smokeless tobacco: Never Used  . Alcohol use No     Allergies   Oxycodone hcl and Vicodin [hydrocodone-acetaminophen]   Review of Systems Review of Systems  Constitutional: Negative for fever.  HENT: Positive for dental problem and facial swelling.  Negative for drooling and trouble swallowing.   Respiratory: Negative for shortness of breath.   Gastrointestinal: Negative for vomiting.  All other systems reviewed and are negative.    Physical Exam Updated Vital Signs BP (!) 141/89 (BP Location: Left Arm)   Pulse 87   Temp 98.8 F (37.1 C) (Oral)   Resp 18   Ht 5\' 8"  (1.727 m)   Wt (!) 340 lb (154.2 kg)   LMP 09/07/2016   SpO2 96%   BMI 51.70 kg/m   Physical Exam  Constitutional: She is oriented to person, place, and time. She appears well-developed and well-nourished.  HENT:  Head: Normocephalic and atraumatic.  Right Ear: External ear normal.  Left Ear: External ear normal.  Nose: Nose normal.  Golfball sized swelling over left anterior mandible without skin changes. Point tenderness without swelling to her left premolars. No obvious drainable abscess. No trismus.  Eyes: Right eye exhibits no discharge. Left eye exhibits no discharge.  Cardiovascular: Normal rate, regular rhythm and normal heart sounds.   Pulmonary/Chest: Effort normal and breath sounds normal.  Abdominal: Soft. There is no tenderness.  Neurological: She is alert and oriented to person, place, and time.    Skin: Skin is warm and dry.  Nursing note and vitals reviewed.   ED Treatments / Results  DIAGNOSTIC STUDIES:  Oxygen Saturation is 100% on RA, normal by my interpretation.    COORDINATION OF CARE:  1:21 AM Discussed treatment plan with pt at bedside which includes abx and pain medication and pt agreed to plan.  Labs (all labs ordered are listed, but only abnormal results are displayed) Labs Reviewed  CBC WITH DIFFERENTIAL/PLATELET - Abnormal; Notable for the following:       Result Value   Hemoglobin 9.9 (*)    HCT 30.9 (*)    MCV 75.2 (*)    MCH 24.1 (*)    Platelets 405 (*)    All other components within normal limits  BASIC METABOLIC PANEL - Abnormal; Notable for the following:    Potassium 2.8 (*)    Creatinine, Ser 1.15 (*)    GFR calc non Af Amer 57 (*)    All other components within normal limits  PROTIME-INR - Abnormal; Notable for the following:    Prothrombin Time 43.8 (*)    INR 4.48 (*)    All other components within normal limits    EKG  EKG Interpretation None       Radiology Ct Soft Tissue Neck W Contrast  Result Date: 10/02/2016 CLINICAL DATA:  Left jaw swelling and infection. EXAM: CT NECK WITH CONTRAST TECHNIQUE: Multidetector CT imaging of the neck was performed using the standard protocol following the bolus administration of intravenous contrast. CONTRAST:  40mL ISOVUE-300 IOPAMIDOL (ISOVUE-300) INJECTION 61% COMPARISON:  None. FINDINGS: Pharynx and larynx: The nasopharynx is clear. The oropharynx and hypopharynx are normal. The epiglottis, supraglottic larynx, glottis and subglottic larynx are normal. No retropharyngeal collection. The parapharyngeal spaces are preserved. The visible oral cavity and base of tongue are normal. Salivary glands: The parotid, sublingual and submandibular glands are normal. No sialolithiasis or salivary ductal dilatation. Thyroid: Normal Lymph nodes: 9 mm right level IIa nodes. 7 mm left level IIa node. No enlarged or  abnormal density nodes. Vascular: The major cervical vessels are normal. Limited intracranial: Normal Visualized orbits: Normal Mastoids and visualized paranasal sinuses: Mild bilateral inferior maxillary mucosal thickening. Skeleton: There is no bony spinal canal stenosis. No lytic or blastic lesions. Upper chest: Clear Other:  There are periapical lucencies of the maxillary left second tooth from midline, probably the left canine (precise numbering of the teeth is due to the number of missing teeth), and the left mandibular first premolar. The maxillary periapical lucency is discontinuous anteriorly (series 3, image 20). There is marked soft tissue swelling overlying the left mandible. No abscess or drainable fluid collection. IMPRESSION: 1. Odontogenic cellulitis of the left pre-mandibular facial soft tissues without abscess or drainable fluid collection. 2. Suspected source of infection is the periapical lucency of the left mandibular first premolar, given its proximity to the soft tissue abnormality. An alternative possibility is the periapical lucency at the maxillary left second tooth from midline (probably the left canine), which has discontinuous cortex anteriorly. 3. Borderline enlarged reactive bilateral lymph nodes. Electronically Signed   By: Ulyses Jarred M.D.   On: 10/02/2016 03:01    Procedures Procedures (including critical care time)  Medications Ordered in ED Medications  oxyCODONE-acetaminophen (PERCOCET/ROXICET) 5-325 MG per tablet 2 tablet (not administered)  fentaNYL (SUBLIMAZE) injection 100 mcg (100 mcg Intravenous Given 10/02/16 0141)  sodium chloride 0.9 % bolus 1,000 mL (0 mLs Intravenous Stopped 10/02/16 0225)  potassium chloride 20 MEQ/15ML (10%) solution 40 mEq (40 mEq Oral Given 10/02/16 0135)  clindamycin (CLEOCIN) IVPB 600 mg (0 mg Intravenous Stopped 10/02/16 0225)  iopamidol (ISOVUE-300) 61 % injection (75 mLs  Contrast Given 10/02/16 0231)     Initial Impression /  Assessment and Plan / ED Course  I have reviewed the triage vital signs and the nursing notes.  Pertinent labs & imaging results that were available during my care of the patient were reviewed by me and considered in my medical decision making (see chart for details).     No trismus or signs of significant deep space infection. No airway compromise. Able to tolerate PO. Will treat with clindamycin, oxycodone for pain (despite this listed as an intolerance she states it's been ok in the past), and potassium for her recurrent hypokalemia. F/u with oral surgery and PCP. Discussed return precautions. Hold coumadin for tonight due to supra therapeutic INR  Final Clinical Impressions(s) / ED Diagnoses   Final diagnoses:  Facial cellulitis  Dental infection  Supratherapeutic INR  Hypokalemia    New Prescriptions New Prescriptions   CLINDAMYCIN (CLEOCIN) 300 MG CAPSULE    Take 1 capsule (300 mg total) by mouth 4 (four) times daily. X 7 days   OXYCODONE (ROXICODONE) 5 MG IMMEDIATE RELEASE TABLET    Take 1-2 tablets (5-10 mg total) by mouth every 4 (four) hours as needed for severe pain.   POTASSIUM CHLORIDE SA (K-DUR,KLOR-CON) 20 MEQ TABLET    Take 1 tablet (20 mEq total) by mouth 2 (two) times daily.   I personally performed the services described in this documentation, which was scribed in my presence. The recorded information has been reviewed and is accurate.     Alice Gambler, MD 10/02/16 [phone]

## 2016-10-03 ENCOUNTER — Ambulatory Visit (INDEPENDENT_AMBULATORY_CARE_PROVIDER_SITE_OTHER): Payer: Medicare Other | Admitting: Orthopedic Surgery

## 2016-10-03 DIAGNOSIS — F419 Anxiety disorder, unspecified: Secondary | ICD-10-CM | POA: Diagnosis not present

## 2016-10-03 DIAGNOSIS — L97211 Non-pressure chronic ulcer of right calf limited to breakdown of skin: Secondary | ICD-10-CM | POA: Diagnosis not present

## 2016-10-03 DIAGNOSIS — I878 Other specified disorders of veins: Secondary | ICD-10-CM | POA: Diagnosis not present

## 2016-10-03 DIAGNOSIS — I10 Essential (primary) hypertension: Secondary | ICD-10-CM | POA: Diagnosis not present

## 2016-10-03 DIAGNOSIS — D649 Anemia, unspecified: Secondary | ICD-10-CM | POA: Diagnosis not present

## 2016-10-03 DIAGNOSIS — F329 Major depressive disorder, single episode, unspecified: Secondary | ICD-10-CM | POA: Diagnosis not present

## 2016-10-06 ENCOUNTER — Telehealth (INDEPENDENT_AMBULATORY_CARE_PROVIDER_SITE_OTHER): Payer: Self-pay

## 2016-10-06 DIAGNOSIS — F419 Anxiety disorder, unspecified: Secondary | ICD-10-CM | POA: Diagnosis not present

## 2016-10-06 DIAGNOSIS — D649 Anemia, unspecified: Secondary | ICD-10-CM | POA: Diagnosis not present

## 2016-10-06 DIAGNOSIS — I878 Other specified disorders of veins: Secondary | ICD-10-CM | POA: Diagnosis not present

## 2016-10-06 DIAGNOSIS — I10 Essential (primary) hypertension: Secondary | ICD-10-CM | POA: Diagnosis not present

## 2016-10-06 DIAGNOSIS — F329 Major depressive disorder, single episode, unspecified: Secondary | ICD-10-CM | POA: Diagnosis not present

## 2016-10-06 DIAGNOSIS — L97211 Non-pressure chronic ulcer of right calf limited to breakdown of skin: Secondary | ICD-10-CM | POA: Diagnosis not present

## 2016-10-06 NOTE — Telephone Encounter (Signed)
Alice [name] with [facility] would like verbal orders for wound care for patient.  2 week 9 and 2 PRN.  Gave okay for verbal orders for patient.

## 2016-10-13 ENCOUNTER — Encounter (INDEPENDENT_AMBULATORY_CARE_PROVIDER_SITE_OTHER): Payer: Self-pay | Admitting: Orthopedic Surgery

## 2016-10-13 ENCOUNTER — Ambulatory Visit (INDEPENDENT_AMBULATORY_CARE_PROVIDER_SITE_OTHER): Payer: Medicare Other | Admitting: Orthopedic Surgery

## 2016-10-13 VITALS — Ht 68.0 in | Wt 340.0 lb

## 2016-10-13 DIAGNOSIS — G629 Polyneuropathy, unspecified: Secondary | ICD-10-CM | POA: Diagnosis not present

## 2016-10-13 DIAGNOSIS — R1013 Epigastric pain: Secondary | ICD-10-CM | POA: Diagnosis not present

## 2016-10-13 DIAGNOSIS — I10 Essential (primary) hypertension: Secondary | ICD-10-CM | POA: Diagnosis not present

## 2016-10-13 DIAGNOSIS — I87331 Chronic venous hypertension (idiopathic) with ulcer and inflammation of right lower extremity: Secondary | ICD-10-CM | POA: Diagnosis not present

## 2016-10-13 DIAGNOSIS — Z7901 Long term (current) use of anticoagulants: Secondary | ICD-10-CM | POA: Diagnosis not present

## 2016-10-13 DIAGNOSIS — G4733 Obstructive sleep apnea (adult) (pediatric): Secondary | ICD-10-CM | POA: Diagnosis not present

## 2016-10-13 DIAGNOSIS — F329 Major depressive disorder, single episode, unspecified: Secondary | ICD-10-CM | POA: Diagnosis not present

## 2016-10-13 DIAGNOSIS — E559 Vitamin D deficiency, unspecified: Secondary | ICD-10-CM | POA: Diagnosis not present

## 2016-10-13 DIAGNOSIS — R7303 Prediabetes: Secondary | ICD-10-CM | POA: Diagnosis not present

## 2016-10-13 DIAGNOSIS — L97919 Non-pressure chronic ulcer of unspecified part of right lower leg with unspecified severity: Principal | ICD-10-CM

## 2016-10-13 NOTE — Progress Notes (Signed)
Office Visit Note   Patient: Alice [name]           Date of Birth: 1970/05/22           MRN: [id number] Visit Date: 10/13/2016              Requested by: Benito Mccreedy, MD [address] [place], [place] [address] PCP: Benito Mccreedy, MD  Chief Complaint  Patient presents with  . Right Leg - Dressing Change    07/17/16 skin graft right leg with Theraskin application       HPI: Patient presents in follow-up with venous ulcer right leg status post skin graft. Patient states that she's been placed on 2 antibiotics for a oral infection. She states that after the second antibiotic the wound has dried up.  Assessment & Plan: Visit Diagnoses:  1. 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 (HCC)     Plan: She'll continue with the silver alginate dressing plus a Dynaflex wrap at home twice a week. We will apply Iodosorb plus 4 x 4 plus a Dynaflex wraps today.  Follow-Up Instructions: Return in about 3 weeks (around 11/03/2016).   Ortho Exam  Patient is alert, oriented, no adenopathy, well-dressed, normal affect, normal respiratory effort. Examination patient has no cellulitis she does have decreased swelling to the leg the wound bed is approximately 50% the exudative tissue 50% granulation tissue. There is no drainage no odor no cellulitis no signs of infection.  Imaging: No results found.  Labs: Lab Results  Component Value Date   REPTSTATUS 05/17/2013 FINAL 05/17/2013   REPTSTATUS 05/17/2013 FINAL 05/17/2013   REPTSTATUS 05/19/2013 FINAL 05/17/2013   GRAMSTAIN  05/17/2013    MODERATE WBC PRESENT, PREDOMINANTLY MONONUCLEAR MODERATE GRAM POSITIVE COCCI IN PAIRS IN CHAINS RARE GRAM NEGATIVE RODS Gram Stain Report Called to,Read Back By and Verified With: HOPPER,M RN @ [phone] 05/17/13 LEONARD,A   GRAMSTAIN  05/17/2013    MODERATE WBC PRESENT, PREDOMINANTLY MONONUCLEAR MODERATE SQUAMOUS EPITHELIAL CELLS  PRESENT MODERATE GRAM POSITIVE COCCI IN PAIRS IN CHAINS RARE GRAM NEGATIVE RODS Performed at [facility]  05/17/2013    NORMAL OROPHARYNGEAL FLORA Performed at [facility] 09/30/2007    Orders:  No orders of the defined types were placed in this encounter.  No orders of the defined types were placed in this encounter.    Procedures: No procedures performed  Clinical Data: No additional findings.  ROS:  All other systems negative, except as noted in the HPI. Review of Systems  Objective: Vital Signs: Ht 5\' 8"  (1.727 m)   Wt (!) 340 lb (154.2 kg)   BMI 51.70 kg/m   Specialty Comments:  No specialty comments available.  PMFS History: Patient Active Problem List   Diagnosis Date Noted  . Hx of skin graft 08/01/2016  . 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 ([place]) 07/25/2016  . 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 ([place]) 07/05/2016  . Trichomonal infection 11/24/2015  . Abdominal pain 05/02/2015  . Symptomatic cholelithiasis 04/16/2015  . Acute bilateral upper abdominal pain 04/16/2015  . Anemia 05/18/2013  . Hypotension, unspecified 05/17/2013  . CKD (chronic kidney disease), stage III 05/17/2013  . Anal fissure 02/06/2013  . Anal skin tag 02/06/2013  . ALLERGIC RHINITIS 03/05/2010  . LOW BACK PAIN SYNDROME 12/13/2007  . WOUND INFECTION 10/10/2007  . OBESITY, MORBID 12/02/2006  .  HTN (hypertension) 12/02/2006  . History of pulmonary embolism 12/02/2006  . History of DVT (deep vein thrombosis) 12/02/2006  . 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 GASTROESOPHAGEAL REFLUX DISEASE 12/02/2006  . OSA (obstructive sleep apnea) 12/02/2006   Past Medical History:  Diagnosis Date  . Anemia   . Anxiety   . Chronic bronchitis ([place])   . Chronic upper back pain   . Depression   . DVT (deep venous thrombosis) (HCC)    BLE  .  GERD (gastroesophageal reflux disease)   . Headache    "weekly" (04/16/2015)  . Hypertension   . Migraine    "monthly" (04/16/2015)  . Obesity   . Pneumonia 2014  . Pulmonary embolism ([place])   . Sinusitis nasal   . Sleep apnea    "I'm suppose to wear a mask but I don't" (04/16/2015)  . Varicose veins of right lower extremity   . Venous stasis of lower extremity    right    Family History  Problem Relation Age of Onset  . Kidney disease Mother        kidney transplant    Past Surgical History:  Procedure Laterality Date  . CHOLECYSTECTOMY N/A 04/18/2015   Procedure: LAPAROSCOPIC CHOLECYSTECTOMY;  Surgeon: Ralene Ok, MD;  Location: [facility];  Service: General;  Laterality: N/A;  . HYSTEROSCOPY W/D&C  12/24/2001   Archie Endo 09/28/2010  . I&D EXTREMITY Right 07/25/2016   Procedure: IRRIGATION AND DEBRIDEMENT RIGHT LEG ULCER, APPLY VERAFLO VAC;  Surgeon: [name], MD;  Location: [facility];  Service: Orthopedics;  Laterality: Right;  . INCISE AND DRAIN ABCESS Right 07/14/2016  . INCISION AND DRAINAGE Right 09/10/2008   leg:  skin and soft tissue and muscle/notes 09/15/2010  . INCISION AND DRAINAGE Right 01/01/2008   Chronic venous stasis insufficiency ulcer,/notes 09/14/2010/  . INCISION AND DRAINAGE Right 0/9811   calf w/application wound vac/notes 07/20/2010  . INCISION AND DRAINAGE Right 07/25/2016   IRRIGATION AND DEBRIDEMENT RIGHT LEG ULCER,  . LAPAROSCOPIC GASTRIC BYPASS  ~ 2007  . SKIN GRAFT SPLIT THICKNESS LEG / FOOT Right 07/25/2016   LEG  . SKIN SPLIT GRAFT Right 07/27/2016   Procedure: SKIN GRAFT RIGHT LEG WITH THERASKIN APPLICATION;  Surgeon: [name], MD;  Location: [facility];  Service: Orthopedics;  Laterality: Right;  . TONSILLECTOMY     Social History   Occupational History  . Not on file.   Social History Main Topics  . Smoking status: Never Smoker  . Smokeless tobacco: Never Used  . Alcohol use No  . Drug use: No  . Sexual activity: Yes    Partners: Male    Birth  control/ protection: IUD

## 2016-10-21 NOTE — Addendum Note (Signed)
Addendum  created 10/21/16 1052 by Lyn Hollingshead, MD   Sign clinical note

## 2016-11-03 ENCOUNTER — Ambulatory Visit (INDEPENDENT_AMBULATORY_CARE_PROVIDER_SITE_OTHER): Payer: Medicare Other | Admitting: Orthopedic Surgery

## 2016-11-03 ENCOUNTER — Encounter (INDEPENDENT_AMBULATORY_CARE_PROVIDER_SITE_OTHER): Payer: Self-pay | Admitting: Orthopedic Surgery

## 2016-11-03 VITALS — Ht 68.0 in | Wt 340.0 lb

## 2016-11-03 DIAGNOSIS — I87331 Chronic venous hypertension (idiopathic) with ulcer and inflammation of right lower extremity: Secondary | ICD-10-CM

## 2016-11-03 DIAGNOSIS — L97919 Non-pressure chronic ulcer of unspecified part of right lower leg with unspecified severity: Principal | ICD-10-CM

## 2016-11-03 NOTE — Progress Notes (Signed)
Office Visit Note   Patient: Alice [name]           Date of Birth: 11-05-1970           MRN: [id number] Visit Date: 11/03/2016              Requested by: Benito Mccreedy, MD [address], [place] [address] PCP: Benito Mccreedy, MD  Chief Complaint  Patient presents with  . Right Foot - Wound Check      HPI: Patient presents status post instillation wound VAC status post skin graft with a topical wound VAC she is currently undergoing compression wraps twice a week at home. Patient feels like the drainage that she continues to have is from the initial application of Iodosorb.  Assessment & Plan: Visit Diagnoses:  1. 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 Recommend that she not have the wrap change tomorrow. Recommended washing with soap and water 2 facilitate debriding the fibrinous exudative tissue.  Follow-Up Instructions: Return in about 2 weeks (around 11/17/2016).   Ortho Exam  Patient is alert, oriented, no adenopathy, well-dressed, normal affect, normal respiratory effort. Examination patient does have decreased swelling the wound measures 13 x 5 cm and does have proud flesh. There is fibrinous exudative tissue covering approximate 90% of the wound. Attempted gentle debridement was painful. There is no bleeding no odor no cellulitis no signs of infection.  Imaging: No results found.  Labs: Lab Results  Component Value Date   REPTSTATUS 05/17/2013 FINAL 05/17/2013   REPTSTATUS 05/17/2013 FINAL 05/17/2013   REPTSTATUS 05/19/2013 FINAL 05/17/2013   GRAMSTAIN  05/17/2013    MODERATE WBC PRESENT, PREDOMINANTLY MONONUCLEAR MODERATE GRAM POSITIVE COCCI IN PAIRS IN CHAINS RARE GRAM NEGATIVE RODS Gram Stain Report Called to,Read Back By and Verified With: HOPPER,M RN @ [phone] 05/17/13 LEONARD,A   GRAMSTAIN  05/17/2013    MODERATE WBC PRESENT, PREDOMINANTLY MONONUCLEAR MODERATE SQUAMOUS EPITHELIAL CELLS PRESENT MODERATE GRAM POSITIVE COCCI IN PAIRS IN CHAINS RARE GRAM NEGATIVE RODS Performed at [facility]  05/17/2013    NORMAL OROPHARYNGEAL FLORA Performed at [facility] 09/30/2007    Orders:  No orders of the defined types were placed in this encounter.  No orders of the defined types were placed in this encounter.    Procedures: No procedures performed  Clinical Data: No additional findings.  ROS:  All other systems negative, except as noted in the HPI. Review of Systems  Objective: Vital Signs: Ht 5\' 8"  (1.727 m)   Wt (!) 340 lb (154.2 kg)   BMI 51.70 kg/m   Specialty Comments:  No specialty comments available.  PMFS History: Patient Active Problem List   Diagnosis Date Noted  . Hx of skin graft 08/01/2016  . 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 ([place]) 07/25/2016  . 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 Trichomonal infection 11/24/2015  . Abdominal pain 05/02/2015  . Symptomatic cholelithiasis 04/16/2015  . Acute bilateral upper abdominal pain 04/16/2015  . Anemia 05/18/2013  . Hypotension, unspecified 05/17/2013  . CKD (chronic kidney disease), stage III 05/17/2013  . Anal fissure 02/06/2013  . Anal skin tag 02/06/2013  . ALLERGIC RHINITIS 03/05/2010  . LOW BACK PAIN SYNDROME 12/13/2007  . WOUND INFECTION  10/10/2007  . OBESITY, MORBID 12/02/2006  . HTN (hypertension) 12/02/2006  . History of pulmonary embolism 12/02/2006  . History of DVT (deep vein thrombosis) 12/02/2006  . 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 GASTROESOPHAGEAL REFLUX DISEASE 12/02/2006  . OSA (obstructive sleep apnea) 12/02/2006   Past Medical History:  Diagnosis Date  . Anemia   . Anxiety   . Chronic  bronchitis ([place])   . Chronic upper back pain   . Depression   . DVT (deep venous thrombosis) (HCC)    BLE  . GERD (gastroesophageal reflux disease)   . Headache    "weekly" (04/16/2015)  . Hypertension   . Migraine    "monthly" (04/16/2015)  . Obesity   . Pneumonia 2014  . Pulmonary embolism ([place])   . Sinusitis nasal   . Sleep apnea    "I'm suppose to wear a mask but I don't" (04/16/2015)  . Varicose veins of right lower extremity   . Venous stasis of lower extremity    right    Family History  Problem Relation Age of Onset  . Kidney disease Mother        kidney transplant    Past Surgical History:  Procedure Laterality Date  . CHOLECYSTECTOMY N/A 04/18/2015   Procedure: LAPAROSCOPIC CHOLECYSTECTOMY;  Surgeon: Ralene Ok, MD;  Location: [facility];  Service: General;  Laterality: N/A;  . HYSTEROSCOPY W/D&C  12/24/2001   Archie Endo 09/28/2010  . I&D EXTREMITY Right 07/25/2016   Procedure: IRRIGATION AND DEBRIDEMENT RIGHT LEG ULCER, APPLY VERAFLO VAC;  Surgeon: [name], MD;  Location: [facility];  Service: Orthopedics;  Laterality: Right;  . INCISE AND DRAIN ABCESS Right 07/14/2016  . INCISION AND DRAINAGE Right 09/10/2008   leg:  skin and soft tissue and muscle/notes 09/15/2010  . INCISION AND DRAINAGE Right 01/01/2008   Chronic venous stasis insufficiency ulcer,/notes 09/14/2010/  . INCISION AND DRAINAGE Right 06/5496   calf w/application wound vac/notes 07/20/2010  . INCISION AND DRAINAGE Right 07/25/2016   IRRIGATION AND DEBRIDEMENT RIGHT LEG ULCER,  . LAPAROSCOPIC GASTRIC BYPASS  ~ 2007  . SKIN GRAFT SPLIT THICKNESS LEG / FOOT Right 07/25/2016   LEG  . SKIN SPLIT GRAFT Right 07/27/2016   Procedure: SKIN GRAFT RIGHT LEG WITH THERASKIN APPLICATION;  Surgeon: [name], MD;  Location: [facility];  Service: Orthopedics;  Laterality: Right;  . TONSILLECTOMY     Social History   Occupational History  . Not on file.   Social History Main Topics  . Smoking status: Never Smoker  .  Smokeless tobacco: Never Used  . Alcohol use No  . Drug use: No  . Sexual activity: Yes    Partners: Male    Birth control/ protection: IUD

## 2016-11-11 ENCOUNTER — Telehealth (INDEPENDENT_AMBULATORY_CARE_PROVIDER_SITE_OTHER): Payer: Self-pay

## 2016-11-11 NOTE — Telephone Encounter (Signed)
noted 

## 2016-11-11 NOTE — Telephone Encounter (Signed)
FYI-  Alice [name] with [facility] called stating that patient refused care today and advised nurse that she only needs care on Monday's and Thursday's.  CB# is [phone].

## 2016-11-17 ENCOUNTER — Encounter (INDEPENDENT_AMBULATORY_CARE_PROVIDER_SITE_OTHER): Payer: Self-pay | Admitting: Orthopedic Surgery

## 2016-11-17 ENCOUNTER — Ambulatory Visit (INDEPENDENT_AMBULATORY_CARE_PROVIDER_SITE_OTHER): Payer: Medicare Other | Admitting: Orthopedic Surgery

## 2016-11-17 VITALS — Ht 68.0 in | Wt 340.0 lb

## 2016-11-17 DIAGNOSIS — I87331 Chronic venous hypertension (idiopathic) with ulcer and inflammation of right lower extremity: Secondary | ICD-10-CM

## 2016-11-17 DIAGNOSIS — L97919 Non-pressure chronic ulcer of unspecified part of right lower leg with unspecified severity: Principal | ICD-10-CM

## 2016-11-17 NOTE — Progress Notes (Signed)
Office Visit Note   Patient: Alice [name]           Date of Birth: [birth year]           MRN: [id number] Visit Date: 11/17/2016              Requested by: Benito Mccreedy, MD [address], [place] [address] PCP: Benito Mccreedy, MD  Chief Complaint  Patient presents with  . Right Leg - Follow-up    07/27/16 right calf skin graft       HPI: Patient presents in follow-up for venous stasis ulceration right lower extremity. She is having home health nursing dress her legs twice a week with silver alginate dressing plus a Dynaflex compression wrap. Patient states she has elevating her leg. She states she has not started doing any exercise.  Assessment & Plan: Visit Diagnoses:  1. 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 Recommend alternating with a dry dressing and silver alginate the silver alginate does not seem to be absorbing enough of the drainage. Again discussed the importance of elevation.  Follow-Up Instructions: Return in about 3 weeks (around 12/08/2016).   Ortho Exam  Patient is alert, oriented, no adenopathy, well-dressed, normal affect, normal respiratory effort. Examination there is possibly 90% fibrinous exudative tissue on top of the wound. The wound edges have healthy granulation tissue the ulcer is smaller but the progression is extremely slow. There is no cellulitis no odor no drainage no signs of infection.  Imaging: No results found.  Labs: Lab Results  Component Value Date   REPTSTATUS 05/17/2013 FINAL 05/17/2013   REPTSTATUS 05/17/2013 FINAL 05/17/2013   REPTSTATUS 05/19/2013 FINAL 05/17/2013   GRAMSTAIN  05/17/2013    MODERATE WBC PRESENT, PREDOMINANTLY MONONUCLEAR MODERATE GRAM POSITIVE COCCI IN PAIRS  IN CHAINS RARE GRAM NEGATIVE RODS Gram Stain Report Called to,Read Back By and Verified With: HOPPER,M RN @ [phone] 05/17/13 LEONARD,A   GRAMSTAIN  05/17/2013    MODERATE WBC PRESENT, PREDOMINANTLY MONONUCLEAR MODERATE SQUAMOUS EPITHELIAL CELLS PRESENT MODERATE GRAM POSITIVE COCCI IN PAIRS IN CHAINS RARE GRAM NEGATIVE RODS Performed at [facility]  05/17/2013    NORMAL OROPHARYNGEAL FLORA Performed at [facility] 09/30/2007    Orders:  No orders of the defined types were placed in this encounter.  No orders of the defined types were placed in this encounter.    Procedures: No procedures performed  Clinical Data: No additional findings.  ROS:  All other systems negative, except as noted in the HPI. Review of Systems  Objective: Vital Signs: Ht 5\' 8"  (1.727 m)   Wt (!) 340 lb (154.2 kg)   BMI 51.70 kg/m   Specialty Comments:  No specialty comments available.  PMFS History: Patient Active Problem List   Diagnosis Date Noted  . Hx of skin graft 08/01/2016  . 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 ([place]) 07/25/2016  . 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 Trichomonal infection 11/24/2015  . Abdominal pain 05/02/2015  . Symptomatic cholelithiasis 04/16/2015  . Acute bilateral upper abdominal pain 04/16/2015  . Anemia 05/18/2013  . Hypotension, unspecified 05/17/2013  . CKD (chronic kidney disease), stage  III 05/17/2013  . Anal fissure 02/06/2013  . Anal skin tag 02/06/2013  . ALLERGIC RHINITIS 03/05/2010  . LOW BACK PAIN SYNDROME 12/13/2007  . WOUND INFECTION 10/10/2007  . OBESITY, MORBID 12/02/2006  . HTN (hypertension) 12/02/2006  . History of pulmonary embolism 12/02/2006  . History of DVT (deep vein thrombosis) 12/02/2006  . 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 GASTROESOPHAGEAL REFLUX DISEASE  12/02/2006  . OSA (obstructive sleep apnea) 12/02/2006   Past Medical History:  Diagnosis Date  . Anemia   . Anxiety   . Chronic bronchitis ([place])   . Chronic upper back pain   . Depression   . DVT (deep venous thrombosis) (HCC)    BLE  . GERD (gastroesophageal reflux disease)   . Headache    "weekly" (04/16/2015)  . Hypertension   . Migraine    "monthly" (04/16/2015)  . Obesity   . Pneumonia 2014  . Pulmonary embolism ([place] [place])   . Sinusitis nasal   . Sleep apnea    "I'm suppose to wear a mask but I don't" (04/16/2015)  . Varicose veins of right lower extremity   . Venous stasis of lower extremity    right    Family History  Problem Relation Age of Onset  . Kidney disease Mother        kidney transplant    Past Surgical History:  Procedure Laterality Date  . CHOLECYSTECTOMY N/A 04/18/2015   Procedure: LAPAROSCOPIC CHOLECYSTECTOMY;  Surgeon: Ralene Ok, MD;  Location: [facility];  Service: General;  Laterality: N/A;  . HYSTEROSCOPY W/D&C  12/24/2001   Archie Endo 09/28/2010  . I&D EXTREMITY Right 07/25/2016   Procedure: IRRIGATION AND DEBRIDEMENT RIGHT LEG ULCER, APPLY VERAFLO VAC;  Surgeon: [name], MD;  Location: [facility];  Service: Orthopedics;  Laterality: Right;  . INCISE AND DRAIN ABCESS Right 07/14/2016  . INCISION AND DRAINAGE Right 09/10/2008   leg:  skin and soft tissue and muscle/notes 09/15/2010  . INCISION AND DRAINAGE Right 01/01/2008   Chronic venous stasis insufficiency ulcer,/notes 09/14/2010/  . INCISION AND DRAINAGE Right 08/4032   calf w/application wound vac/notes 07/20/2010  . INCISION AND DRAINAGE Right 07/25/2016   IRRIGATION AND DEBRIDEMENT RIGHT LEG ULCER,  . LAPAROSCOPIC GASTRIC BYPASS  ~ 2007  . SKIN GRAFT SPLIT THICKNESS LEG / FOOT Right 07/25/2016   LEG  . SKIN SPLIT GRAFT Right 07/27/2016   Procedure: SKIN GRAFT RIGHT LEG WITH THERASKIN APPLICATION;  Surgeon: [name], MD;  Location: [facility];  Service: Orthopedics;  Laterality: Right;  . TONSILLECTOMY      Social History   Occupational History  . Not on file.   Social History Main Topics  . Smoking status: Never Smoker  . Smokeless tobacco: Never Used  . Alcohol use No  . Drug use: No  . Sexual activity: Yes    Partners: Male    Birth control/ protection: IUD

## 2016-12-05 ENCOUNTER — Telehealth (INDEPENDENT_AMBULATORY_CARE_PROVIDER_SITE_OTHER): Payer: Self-pay

## 2016-12-05 NOTE — Telephone Encounter (Signed)
Alexis with [facility] would like to know if patient can be discharged.  Patient is currently doing her own wound care and stated that they are not able to do their skills due to patient having already provided care to her wound.  Would like to know if Dr. Sharol Given would like to continue services for patient. CB# is [phone].  Thank You.

## 2016-12-08 ENCOUNTER — Ambulatory Visit (INDEPENDENT_AMBULATORY_CARE_PROVIDER_SITE_OTHER): Payer: Medicare Other | Admitting: Orthopedic Surgery

## 2016-12-08 DIAGNOSIS — I87331 Chronic venous hypertension (idiopathic) with ulcer and inflammation of right lower extremity: Secondary | ICD-10-CM

## 2016-12-08 DIAGNOSIS — L97919 Non-pressure chronic ulcer of unspecified part of right lower leg with unspecified severity: Principal | ICD-10-CM

## 2016-12-08 NOTE — Telephone Encounter (Signed)
Pt in office today and will follow up for wound care here on monday

## 2016-12-12 ENCOUNTER — Encounter (INDEPENDENT_AMBULATORY_CARE_PROVIDER_SITE_OTHER): Payer: Self-pay | Admitting: Orthopedic Surgery

## 2016-12-12 ENCOUNTER — Ambulatory Visit (INDEPENDENT_AMBULATORY_CARE_PROVIDER_SITE_OTHER): Payer: Medicare Other | Admitting: Orthopedic Surgery

## 2016-12-12 DIAGNOSIS — I87331 Chronic venous hypertension (idiopathic) with ulcer and inflammation of right lower extremity: Secondary | ICD-10-CM

## 2016-12-12 DIAGNOSIS — L97919 Non-pressure chronic ulcer of unspecified part of right lower leg with unspecified severity: Principal | ICD-10-CM

## 2016-12-12 NOTE — Progress Notes (Signed)
Office Visit Note   Patient: Alice [name]           Date of Birth: [birth year]           MRN: [id number] Visit Date: 12/12/2016              Requested by: Benito Mccreedy, MD [place] [address], [place] [address] PCP: Benito Mccreedy, MD  Chief Complaint  Patient presents with  . Right Leg - Follow-up      HPI: Patient presents in follow-up for venous insufficiency ulcer right leg. We just changed her over to the Dynaflex compressive wrap with the wall site material against the wound. All other treatments had failed.  Assessment & Plan: Visit Diagnoses:  1. 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 Plan to follow-up Thursday and Monday.  Follow-Up Instructions: Return in about 3 days (around 12/15/2016).   Ortho Exam  Patient is alert, oriented, no adenopathy, well-dressed, normal affect, normal respiratory effort. Examination patient has had excellent improvement of the granulation tissue of the right leg the fibrinous exudative tissue that covered almost the entire wound is completely resolved. The wound is flat there is no rolled up skin edges. There is superficial epithelialization around the wound. There is no odor no drainage no signs of infection there is no cellulitis.  Imaging: No results found.  Labs: Lab Results  Component Value Date   REPTSTATUS 05/17/2013 FINAL 05/17/2013   REPTSTATUS 05/17/2013 FINAL 05/17/2013   REPTSTATUS 05/19/2013 FINAL 05/17/2013   GRAMSTAIN  05/17/2013    MODERATE WBC PRESENT, PREDOMINANTLY MONONUCLEAR MODERATE GRAM POSITIVE COCCI IN PAIRS IN CHAINS RARE GRAM NEGATIVE RODS Gram Stain Report Called to,Read Back By and Verified With: HOPPER,M RN @ [phone] 05/17/13 LEONARD,A   GRAMSTAIN  05/17/2013    MODERATE WBC PRESENT,  PREDOMINANTLY MONONUCLEAR MODERATE SQUAMOUS EPITHELIAL CELLS PRESENT MODERATE GRAM POSITIVE COCCI IN PAIRS IN CHAINS RARE GRAM NEGATIVE RODS Performed at [facility]  05/17/2013    NORMAL OROPHARYNGEAL FLORA Performed at [facility] 09/30/2007    Orders:  No orders of the defined types were placed in this encounter.  No orders of the defined types were placed in this encounter.    Procedures: No procedures performed  Clinical Data: No additional findings.  ROS:  All other systems negative, except as noted in the HPI. Review of Systems  Objective: Vital Signs: There were no vitals taken for this visit.  Specialty Comments:  No specialty comments available.  PMFS History: Patient Active Problem List   Diagnosis Date Noted  . Hx of skin graft 08/01/2016  . 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 ([place]) 07/25/2016  . 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 Trichomonal infection 11/24/2015  . Abdominal pain 05/02/2015  . Symptomatic cholelithiasis 04/16/2015  . Acute bilateral upper abdominal pain 04/16/2015  . Anemia 05/18/2013  . Hypotension, unspecified 05/17/2013  . CKD (chronic kidney disease), stage III 05/17/2013  . Anal fissure 02/06/2013  . Anal skin tag 02/06/2013  . ALLERGIC RHINITIS 03/05/2010  . LOW BACK PAIN SYNDROME 12/13/2007  . WOUND INFECTION 10/10/2007  . OBESITY, MORBID 12/02/2006  . HTN (hypertension) 12/02/2006  . History  of pulmonary embolism 12/02/2006  . History of DVT (deep vein thrombosis) 12/02/2006  . 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 GASTROESOPHAGEAL REFLUX DISEASE 12/02/2006  . OSA (obstructive sleep apnea) 12/02/2006   Past Medical History:  Diagnosis Date  . Anemia   . Anxiety   . Chronic bronchitis ([place])   . Chronic upper back pain   . Depression   . DVT  (deep venous thrombosis) (HCC)    BLE  . GERD (gastroesophageal reflux disease)   . Headache    "weekly" (04/16/2015)  . Hypertension   . Migraine    "monthly" (04/16/2015)  . Obesity   . Pneumonia 2014  . Pulmonary embolism ([place])   . Sinusitis nasal   . Sleep apnea    "I'm suppose to wear a mask but I don't" (04/16/2015)  . Varicose veins of right lower extremity   . Venous stasis of lower extremity    right    Family History  Problem Relation Age of Onset  . Kidney disease Mother        kidney transplant    Past Surgical History:  Procedure Laterality Date  . CHOLECYSTECTOMY N/A 04/18/2015   Procedure: LAPAROSCOPIC CHOLECYSTECTOMY;  Surgeon: Ralene Ok, MD;  Location: [facility];  Service: General;  Laterality: N/A;  . HYSTEROSCOPY W/D&C  12/24/2001   Archie Endo 09/28/2010  . I&D EXTREMITY Right 07/25/2016   Procedure: IRRIGATION AND DEBRIDEMENT RIGHT LEG ULCER, APPLY VERAFLO VAC;  Surgeon: [name], MD;  Location: [facility];  Service: Orthopedics;  Laterality: Right;  . INCISE AND DRAIN ABCESS Right 07/14/2016  . INCISION AND DRAINAGE Right 09/10/2008   leg:  skin and soft tissue and muscle/notes 09/15/2010  . INCISION AND DRAINAGE Right 01/01/2008   Chronic venous stasis insufficiency ulcer,/notes 09/14/2010/  . INCISION AND DRAINAGE Right 01/5283   calf w/application wound vac/notes 07/20/2010  . INCISION AND DRAINAGE Right 07/25/2016   IRRIGATION AND DEBRIDEMENT RIGHT LEG ULCER,  . LAPAROSCOPIC GASTRIC BYPASS  ~ 2007  . SKIN GRAFT SPLIT THICKNESS LEG / FOOT Right 07/25/2016   LEG  . SKIN SPLIT GRAFT Right 07/27/2016   Procedure: SKIN GRAFT RIGHT LEG WITH THERASKIN APPLICATION;  Surgeon: [name], MD;  Location: [facility];  Service: Orthopedics;  Laterality: Right;  . TONSILLECTOMY     Social History   Occupational History  . Not on file.   Social History Main Topics  . Smoking status: Never Smoker  . Smokeless tobacco: Never Used  . Alcohol use No  . Drug use: No  . Sexual  activity: Yes    Partners: Male    Birth control/ protection: IUD

## 2016-12-13 NOTE — Progress Notes (Signed)
Office Visit Note   Patient: Alice [name]           Date of Birth: 05/09/1971           MRN: [id number] Visit Date: 12/08/2016              Requested by: Benito Mccreedy, MD [place] [address], [place] [address] PCP: Benito Mccreedy, MD  Chief Complaint  Patient presents with  . Right Leg - Wound Check, Routine Post Op    07/27/16 right calf skin graft       HPI: Patient is status post skin graft right lower extremity with chronic venous insufficiency.  Assessment & Plan: Visit Diagnoses:  1. 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 Home health is not applying dressings properly or providing proper wound debridement.  Follow-Up Instructions: Return in about 1 week (around 12/15/2016).   Ortho Exam  Patient is alert, oriented, no adenopathy, well-dressed, normal affect, normal respiratory effort. Examination patient has fibrinous exudative tissue covering the entire wound. Patient will not allow a gentle debridement in the office.  Imaging: No results found.  Labs: Lab Results  Component Value Date   REPTSTATUS 05/17/2013 FINAL 05/17/2013   REPTSTATUS 05/17/2013 FINAL 05/17/2013   REPTSTATUS 05/19/2013 FINAL 05/17/2013   GRAMSTAIN  05/17/2013    MODERATE WBC PRESENT, PREDOMINANTLY MONONUCLEAR MODERATE GRAM POSITIVE COCCI IN PAIRS IN CHAINS RARE GRAM NEGATIVE RODS Gram Stain Report Called to,Read Back By and Verified With: HOPPER,M RN @ [phone] 05/17/13 LEONARD,A   GRAMSTAIN  05/17/2013    MODERATE WBC PRESENT, PREDOMINANTLY MONONUCLEAR MODERATE SQUAMOUS EPITHELIAL CELLS PRESENT MODERATE GRAM POSITIVE COCCI IN PAIRS IN CHAINS RARE GRAM NEGATIVE RODS Performed at [facility]  05/17/2013    NORMAL OROPHARYNGEAL FLORA Performed at [facility] 09/30/2007     Orders:  No orders of the defined types were placed in this encounter.  No orders of the defined types were placed in this encounter.    Procedures: No procedures performed  Clinical Data: No additional findings.  ROS:  All other systems negative, except as noted in the HPI. Review of Systems  Objective: Vital Signs: There were no vitals taken for this visit.  Specialty Comments:  No specialty comments available.  PMFS History: Patient Active Problem List   Diagnosis Date Noted  . Hx of skin graft 08/01/2016  . 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 ([place]) 07/25/2016  . 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 Trichomonal infection 11/24/2015  . Abdominal pain 05/02/2015  . Symptomatic cholelithiasis 04/16/2015  . Acute bilateral upper abdominal pain 04/16/2015  . Anemia 05/18/2013  . Hypotension, unspecified 05/17/2013  . CKD (chronic kidney disease), stage III 05/17/2013  . Anal fissure 02/06/2013  . Anal skin tag 02/06/2013  . ALLERGIC RHINITIS 03/05/2010  . LOW BACK PAIN SYNDROME 12/13/2007  . WOUND INFECTION 10/10/2007  . OBESITY, MORBID 12/02/2006  . HTN (hypertension) 12/02/2006  . History of pulmonary embolism 12/02/2006  . History of DVT (deep vein thrombosis) 12/02/2006  . 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 GASTROESOPHAGEAL REFLUX DISEASE 12/02/2006  . OSA (obstructive sleep apnea) 12/02/2006   Past Medical History:  Diagnosis Date  . Anemia   . Anxiety   . Chronic  bronchitis ([place])   . Chronic upper back pain   . Depression   . DVT (deep venous thrombosis) (HCC)    BLE  . GERD (gastroesophageal reflux disease)   . Headache    "weekly" (04/16/2015)  . Hypertension   . Migraine    "monthly" (04/16/2015)  . Obesity   . Pneumonia 2014  . Pulmonary embolism ([place])   . Sinusitis nasal   . Sleep apnea    "I'm suppose to wear a mask but  I don't" (04/16/2015)  . Varicose veins of right lower extremity   . Venous stasis of lower extremity    right    Family History  Problem Relation Age of Onset  . Kidney disease Mother        kidney transplant    Past Surgical History:  Procedure Laterality Date  . CHOLECYSTECTOMY N/A 04/18/2015   Procedure: LAPAROSCOPIC CHOLECYSTECTOMY;  Surgeon: Ralene Ok, MD;  Location: [facility];  Service: General;  Laterality: N/A;  . HYSTEROSCOPY W/D&C  12/24/2001   Archie Endo 09/28/2010  . I&D EXTREMITY Right 07/25/2016   Procedure: IRRIGATION AND DEBRIDEMENT RIGHT LEG ULCER, APPLY VERAFLO VAC;  Surgeon: [name], MD;  Location: [facility];  Service: Orthopedics;  Laterality: Right;  . INCISE AND DRAIN ABCESS Right 07/14/2016  . INCISION AND DRAINAGE Right 09/10/2008   leg:  skin and soft tissue and muscle/notes 09/15/2010  . INCISION AND DRAINAGE Right 01/01/2008   Chronic venous stasis insufficiency ulcer,/notes 09/14/2010/  . INCISION AND DRAINAGE Right 06/231   calf w/application wound vac/notes 07/20/2010  . INCISION AND DRAINAGE Right 07/25/2016   IRRIGATION AND DEBRIDEMENT RIGHT LEG ULCER,  . LAPAROSCOPIC GASTRIC BYPASS  ~ 2007  . SKIN GRAFT SPLIT THICKNESS LEG / FOOT Right 07/25/2016   LEG  . SKIN SPLIT GRAFT Right 07/27/2016   Procedure: SKIN GRAFT RIGHT LEG WITH THERASKIN APPLICATION;  Surgeon: [name], MD;  Location: [facility];  Service: Orthopedics;  Laterality: Right;  . TONSILLECTOMY     Social History   Occupational History  . Not on file.   Social History Main Topics  . Smoking status: Never Smoker  . Smokeless tobacco: Never Used  . Alcohol use No  . Drug use: No  . Sexual activity: Yes    Partners: Male    Birth control/ protection: IUD

## 2016-12-15 ENCOUNTER — Ambulatory Visit (INDEPENDENT_AMBULATORY_CARE_PROVIDER_SITE_OTHER): Payer: Medicare Other | Admitting: Family

## 2016-12-15 DIAGNOSIS — I87331 Chronic venous hypertension (idiopathic) with ulcer and inflammation of right lower extremity: Secondary | ICD-10-CM

## 2016-12-15 DIAGNOSIS — Z945 Skin transplant status: Secondary | ICD-10-CM

## 2016-12-15 DIAGNOSIS — Z9889 Other specified postprocedural states: Secondary | ICD-10-CM

## 2016-12-15 DIAGNOSIS — L97919 Non-pressure chronic ulcer of unspecified part of right lower leg with unspecified severity: Principal | ICD-10-CM

## 2016-12-15 NOTE — Progress Notes (Signed)
Office Visit Note   Patient: Alice [name]           Date of Birth: 06-07-1970           MRN: [id number] Visit Date: 12/15/2016              Requested by: Benito Mccreedy, MD [address] [place], [place] [address] PCP: Benito Mccreedy, MD  No chief complaint on file.     HPI: Patient presents in follow-up for venous insufficiency ulcer right leg. Has been wrapped twice weekly with Dynaflex compressive wrap with the Vive sock material against the wound. All other treatments had failed.  Assessment & Plan: Visit Diagnoses:  1. 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 Hx of skin graft     Plan: We will continue the current wound care with the sock against the wound and the Dynaflex wraps changing this twice a week. Plan to follow-up Monday.  Follow-Up Instructions: Return in about 4 days (around 12/19/2016).   Ortho Exam  Patient is alert, oriented, no adenopathy, well-dressed, normal affect, normal respiratory effort. Examination patient has had excellent improvement of the granulation tissue of the right leg the fibrinous exudative tissue that covered almost the entire wound is completely resolved. The wound is flat there is no rolled up skin edges. There is superficial epithelialization around the wound. There is no odor no drainage no signs of infection there is no cellulitis.  Imaging: No results found.  Labs: Lab Results  Component Value Date   REPTSTATUS 05/17/2013 FINAL 05/17/2013   REPTSTATUS 05/17/2013 FINAL 05/17/2013   REPTSTATUS 05/19/2013 FINAL 05/17/2013   GRAMSTAIN  05/17/2013    MODERATE WBC PRESENT, PREDOMINANTLY MONONUCLEAR MODERATE GRAM POSITIVE COCCI IN PAIRS IN CHAINS RARE GRAM NEGATIVE RODS Gram Stain Report Called to,Read Back By and Verified With: HOPPER,M RN @ [phone] 05/17/13 LEONARD,A   GRAMSTAIN  05/17/2013    MODERATE WBC PRESENT, PREDOMINANTLY MONONUCLEAR MODERATE  SQUAMOUS EPITHELIAL CELLS PRESENT MODERATE GRAM POSITIVE COCCI IN PAIRS IN CHAINS RARE GRAM NEGATIVE RODS Performed at [facility]  05/17/2013    NORMAL OROPHARYNGEAL FLORA Performed at [facility] 09/30/2007    Orders:  No orders of the defined types were placed in this encounter.  No orders of the defined types were placed in this encounter.    Procedures: No procedures performed  Clinical Data: No additional findings.  ROS:  All other systems negative, except as noted in the HPI. Review of Systems  Constitutional: Negative for chills and fever.  Cardiovascular: Positive for leg swelling.  Skin: Positive for wound. Negative for color change.    Objective: Vital Signs: There were no vitals taken for this visit.  Specialty Comments:  No specialty comments available.  PMFS History: Patient Active Problem List   Diagnosis Date Noted  . Hx of skin graft 08/01/2016  . 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 ([place]) 07/25/2016  . 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 Trichomonal infection 11/24/2015  . Abdominal pain 05/02/2015  . Symptomatic cholelithiasis 04/16/2015  . Acute bilateral upper abdominal pain 04/16/2015  . Anemia 05/18/2013  . Hypotension, unspecified 05/17/2013  . CKD (chronic kidney disease), stage III 05/17/2013  . Anal fissure 02/06/2013  . Anal skin tag 02/06/2013  . ALLERGIC RHINITIS 03/05/2010  . LOW BACK PAIN SYNDROME  12/13/2007  . WOUND INFECTION 10/10/2007  . OBESITY, MORBID 12/02/2006  . HTN (hypertension) 12/02/2006  . History of pulmonary embolism 12/02/2006  . History of DVT (deep vein thrombosis) 12/02/2006  . 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 GASTROESOPHAGEAL REFLUX DISEASE 12/02/2006  . OSA (obstructive sleep apnea) 12/02/2006   Past Medical History:  Diagnosis  Date  . Anemia   . Anxiety   . Chronic bronchitis ([place])   . Chronic upper back pain   . Depression   . DVT (deep venous thrombosis) (HCC)    BLE  . GERD (gastroesophageal reflux disease)   . Headache    "weekly" (04/16/2015)  . Hypertension   . Migraine    "monthly" (04/16/2015)  . Obesity   . Pneumonia 2014  . Pulmonary embolism ([place])   . Sinusitis nasal   . Sleep apnea    "I'm suppose to wear a mask but I don't" (04/16/2015)  . Varicose veins of right lower extremity   . Venous stasis of lower extremity    right    Family History  Problem Relation Age of Onset  . Kidney disease Mother        kidney transplant    Past Surgical History:  Procedure Laterality Date  . CHOLECYSTECTOMY N/A 04/18/2015   Procedure: LAPAROSCOPIC CHOLECYSTECTOMY;  Surgeon: Ralene Ok, MD;  Location: [facility];  Service: General;  Laterality: N/A;  . HYSTEROSCOPY W/D&C  12/24/2001   Archie Endo 09/28/2010  . I&D EXTREMITY Right 07/25/2016   Procedure: IRRIGATION AND DEBRIDEMENT RIGHT LEG ULCER, APPLY VERAFLO VAC;  Surgeon: [name], MD;  Location: [facility];  Service: Orthopedics;  Laterality: Right;  . INCISE AND DRAIN ABCESS Right 07/14/2016  . INCISION AND DRAINAGE Right 09/10/2008   leg:  skin and soft tissue and muscle/notes 09/15/2010  . INCISION AND DRAINAGE Right 01/01/2008   Chronic venous stasis insufficiency ulcer,/notes 09/14/2010/  . INCISION AND DRAINAGE Right 05/6107   calf w/application wound vac/notes 07/20/2010  . INCISION AND DRAINAGE Right 07/25/2016   IRRIGATION AND DEBRIDEMENT RIGHT LEG ULCER,  . LAPAROSCOPIC GASTRIC BYPASS  ~ 2007  . SKIN GRAFT SPLIT THICKNESS LEG / FOOT Right 07/25/2016   LEG  . SKIN SPLIT GRAFT Right 07/27/2016   Procedure: SKIN GRAFT RIGHT LEG WITH THERASKIN APPLICATION;  Surgeon: [name], MD;  Location: [facility];  Service: Orthopedics;  Laterality: Right;  . TONSILLECTOMY     Social History   Occupational History  . Not on file.   Social History Main Topics    . Smoking status: Never Smoker  . Smokeless tobacco: Never Used  . Alcohol use No  . Drug use: No  . Sexual activity: Yes    Partners: Male    Birth control/ protection: IUD

## 2016-12-19 ENCOUNTER — Encounter (INDEPENDENT_AMBULATORY_CARE_PROVIDER_SITE_OTHER): Payer: Self-pay | Admitting: Orthopedic Surgery

## 2016-12-19 ENCOUNTER — Encounter (INDEPENDENT_AMBULATORY_CARE_PROVIDER_SITE_OTHER): Payer: Medicare Other | Admitting: Orthopedic Surgery

## 2016-12-19 ENCOUNTER — Telehealth (INDEPENDENT_AMBULATORY_CARE_PROVIDER_SITE_OTHER): Payer: Self-pay | Admitting: Orthopedic Surgery

## 2016-12-19 NOTE — Telephone Encounter (Signed)
Patient called wanting to know if she can come tomorrow to pick up what she needed for her knee and come back Thursday to her appointment since she wasn't able to make her appointment today because of car troubles. CB # [phone]

## 2016-12-20 ENCOUNTER — Ambulatory Visit (INDEPENDENT_AMBULATORY_CARE_PROVIDER_SITE_OTHER): Payer: Medicare Other | Admitting: Radiology

## 2016-12-20 DIAGNOSIS — L97919 Non-pressure chronic ulcer of unspecified part of right lower leg with unspecified severity: Secondary | ICD-10-CM

## 2016-12-20 DIAGNOSIS — I83019 Varicose veins of right lower extremity with ulcer of unspecified site: Secondary | ICD-10-CM

## 2016-12-20 NOTE — Telephone Encounter (Signed)
Can you call and see if she wants to come in today and have dressing changed as a nurse only visit? I can give the compression dressing supplies but we can work her in for a dressing change. Please call . Thanks.

## 2016-12-20 NOTE — Progress Notes (Signed)
Patient was seen as nurse only visit for dressing change only.

## 2016-12-20 NOTE — Telephone Encounter (Signed)
Called and spoke with patient she said she can come in at 4 today.

## 2016-12-20 NOTE — Telephone Encounter (Signed)
Can you please make the appt.

## 2016-12-22 ENCOUNTER — Encounter (INDEPENDENT_AMBULATORY_CARE_PROVIDER_SITE_OTHER): Payer: Self-pay | Admitting: Orthopedic Surgery

## 2016-12-22 ENCOUNTER — Ambulatory Visit (INDEPENDENT_AMBULATORY_CARE_PROVIDER_SITE_OTHER): Payer: Medicare Other | Admitting: Orthopedic Surgery

## 2016-12-22 VITALS — Ht 68.0 in | Wt 340.0 lb

## 2016-12-22 DIAGNOSIS — L97919 Non-pressure chronic ulcer of unspecified part of right lower leg with unspecified severity: Principal | ICD-10-CM

## 2016-12-22 DIAGNOSIS — I87331 Chronic venous hypertension (idiopathic) with ulcer and inflammation of right lower extremity: Secondary | ICD-10-CM

## 2016-12-26 ENCOUNTER — Ambulatory Visit (INDEPENDENT_AMBULATORY_CARE_PROVIDER_SITE_OTHER): Payer: Medicare Other | Admitting: Family

## 2016-12-26 DIAGNOSIS — L97919 Non-pressure chronic ulcer of unspecified part of right lower leg with unspecified severity: Principal | ICD-10-CM

## 2016-12-26 DIAGNOSIS — Z945 Skin transplant status: Secondary | ICD-10-CM

## 2016-12-26 DIAGNOSIS — I87331 Chronic venous hypertension (idiopathic) with ulcer and inflammation of right lower extremity: Secondary | ICD-10-CM

## 2016-12-26 DIAGNOSIS — Z9889 Other specified postprocedural states: Secondary | ICD-10-CM

## 2016-12-26 NOTE — Progress Notes (Signed)
Office Visit Note   Patient: Alice [name]           Date of Birth: 20-May-1970           MRN: [id number] Visit Date: 12/26/2016              Requested by: Benito Mccreedy, MD [address] [place], [place] [address] PCP: Benito Mccreedy, MD  No chief complaint on file.     HPI: Patient presents in follow-up for venous insufficiency ulcer right leg. Has been wrapped twice weekly with Dynaflex compressive wrap with the Vive sock material against the wound. All other treatments had failed.  Patient is pleased with current treatment.  Assessment & Plan: Visit Diagnoses:  1. 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 Hx of skin graft     Plan: We will continue the current wound care with the sock against the wound and the Dynaflex wraps changing this twice a week. Plan to follow-up Thursday.  Follow-Up Instructions: Return in about 1 week (around 01/02/2017).   Ortho Exam  Patient is alert, oriented, no adenopathy, well-dressed, normal affect, normal respiratory effort. Examination patient has had excellent improvement of the granulation tissue of the right leg. no fibrinous exudative tissue. The wound is flat there is no rolled up skin edges. There is superficial epithelialization around the wound. There is no odor no drainage no signs of infection there is no cellulitis.  Imaging: No results found.  Labs: Lab Results  Component Value Date   REPTSTATUS 05/17/2013 FINAL 05/17/2013   REPTSTATUS 05/17/2013 FINAL 05/17/2013   REPTSTATUS 05/19/2013 FINAL 05/17/2013   GRAMSTAIN  05/17/2013    MODERATE WBC PRESENT, PREDOMINANTLY MONONUCLEAR MODERATE GRAM POSITIVE COCCI IN PAIRS IN CHAINS RARE GRAM NEGATIVE RODS Gram Stain Report Called to,Read Back By and Verified With: HOPPER,M RN @ [phone] 05/17/13 LEONARD,A   GRAMSTAIN  05/17/2013    MODERATE WBC PRESENT, PREDOMINANTLY MONONUCLEAR MODERATE SQUAMOUS  EPITHELIAL CELLS PRESENT MODERATE GRAM POSITIVE COCCI IN PAIRS IN CHAINS RARE GRAM NEGATIVE RODS Performed at [facility]  05/17/2013    NORMAL OROPHARYNGEAL FLORA Performed at [facility] 09/30/2007    Orders:  No orders of the defined types were placed in this encounter.  No orders of the defined types were placed in this encounter.    Procedures: No procedures performed  Clinical Data: No additional findings.  ROS:  All other systems negative, except as noted in the HPI. Review of Systems  Constitutional: Negative for chills and fever.  Cardiovascular: Positive for leg swelling.  Skin: Positive for wound. Negative for color change.    Objective: Vital Signs: There were no vitals taken for this visit.  Specialty Comments:  No specialty comments available.  PMFS History: Patient Active Problem List   Diagnosis Date Noted  . Hx of skin graft 08/01/2016  . 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 ([place]) 07/25/2016  . 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 Trichomonal infection 11/24/2015  . Abdominal pain 05/02/2015  . Symptomatic cholelithiasis 04/16/2015  . Acute bilateral upper abdominal pain 04/16/2015  . Anemia 05/18/2013  . Hypotension, unspecified 05/17/2013  . CKD (chronic kidney disease), stage III 05/17/2013  . Anal fissure 02/06/2013  . Anal skin tag 02/06/2013  . ALLERGIC RHINITIS 03/05/2010  . LOW BACK PAIN SYNDROME 12/13/2007  .  WOUND INFECTION 10/10/2007  . OBESITY, MORBID 12/02/2006  . HTN (hypertension) 12/02/2006  . History of pulmonary embolism 12/02/2006  . History of DVT (deep vein thrombosis) 12/02/2006  . 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 GASTROESOPHAGEAL REFLUX DISEASE 12/02/2006  . OSA (obstructive sleep apnea) 12/02/2006   Past Medical History:  Diagnosis Date  .  Anemia   . Anxiety   . Chronic bronchitis ([place])   . Chronic upper back pain   . Depression   . DVT (deep venous thrombosis) (HCC)    BLE  . GERD (gastroesophageal reflux disease)   . Headache    "weekly" (04/16/2015)  . Hypertension   . Migraine    "monthly" (04/16/2015)  . Obesity   . Pneumonia 2014  . Pulmonary embolism ([place])   . Sinusitis nasal   . Sleep apnea    "I'm suppose to wear a mask but I don't" (04/16/2015)  . Varicose veins of right lower extremity   . Venous stasis of lower extremity    right    Family History  Problem Relation Age of Onset  . Kidney disease Mother        kidney transplant    Past Surgical History:  Procedure Laterality Date  . CHOLECYSTECTOMY N/A 04/18/2015   Procedure: LAPAROSCOPIC CHOLECYSTECTOMY;  Surgeon: Ralene Ok, MD;  Location: [facility];  Service: General;  Laterality: N/A;  . HYSTEROSCOPY W/D&C  12/24/2001   Archie Endo 09/28/2010  . I&D EXTREMITY Right 07/25/2016   Procedure: IRRIGATION AND DEBRIDEMENT RIGHT LEG ULCER, APPLY VERAFLO VAC;  Surgeon: [name], MD;  Location: [facility];  Service: Orthopedics;  Laterality: Right;  . INCISE AND DRAIN ABCESS Right 07/14/2016  . INCISION AND DRAINAGE Right 09/10/2008   leg:  skin and soft tissue and muscle/notes 09/15/2010  . INCISION AND DRAINAGE Right 01/01/2008   Chronic venous stasis insufficiency ulcer,/notes 09/14/2010/  . INCISION AND DRAINAGE Right 0/9983   calf w/application wound vac/notes 07/20/2010  . INCISION AND DRAINAGE Right 07/25/2016   IRRIGATION AND DEBRIDEMENT RIGHT LEG ULCER,  . LAPAROSCOPIC GASTRIC BYPASS  ~ 2007  . SKIN GRAFT SPLIT THICKNESS LEG / FOOT Right 07/25/2016   LEG  . SKIN SPLIT GRAFT Right 07/27/2016   Procedure: SKIN GRAFT RIGHT LEG WITH THERASKIN APPLICATION;  Surgeon: [name], MD;  Location: [facility];  Service: Orthopedics;  Laterality: Right;  . TONSILLECTOMY     Social History   Occupational History  . Not on file.   Social History Main Topics  .  Smoking status: Never Smoker  . Smokeless tobacco: Never Used  . Alcohol use No  . Drug use: No  . Sexual activity: Yes    Partners: Male    Birth control/ protection: IUD

## 2016-12-28 ENCOUNTER — Emergency Department (HOSPITAL_COMMUNITY): Payer: Medicare Other

## 2016-12-28 ENCOUNTER — Encounter (HOSPITAL_COMMUNITY): Payer: Self-pay

## 2016-12-28 ENCOUNTER — Inpatient Hospital Stay (HOSPITAL_COMMUNITY): Payer: Medicare Other

## 2016-12-28 ENCOUNTER — Inpatient Hospital Stay (HOSPITAL_COMMUNITY)
Admission: EM | Admit: 2016-12-28 | Discharge: 2017-01-03 | DRG: 871 | Disposition: A | Discharge status: Home / Self Care | Payer: Medicare Other | Attending: Internal Medicine | Admitting: Internal Medicine

## 2016-12-28 DIAGNOSIS — D509 Iron deficiency anemia, unspecified: Secondary | ICD-10-CM | POA: Diagnosis not present

## 2016-12-28 DIAGNOSIS — Z8701 Personal history of pneumonia (recurrent): Secondary | ICD-10-CM

## 2016-12-28 DIAGNOSIS — I10 Essential (primary) hypertension: Secondary | ICD-10-CM | POA: Diagnosis present

## 2016-12-28 DIAGNOSIS — Z86718 Personal history of other venous thrombosis and embolism: Secondary | ICD-10-CM | POA: Diagnosis not present

## 2016-12-28 DIAGNOSIS — A4151 Sepsis due to Escherichia coli [E. coli]: Secondary | ICD-10-CM | POA: Diagnosis not present

## 2016-12-28 DIAGNOSIS — E861 Hypovolemia: Secondary | ICD-10-CM | POA: Diagnosis present

## 2016-12-28 DIAGNOSIS — I83019 Varicose veins of right lower extremity with ulcer of unspecified site: Secondary | ICD-10-CM | POA: Diagnosis not present

## 2016-12-28 DIAGNOSIS — D631 Anemia in chronic kidney disease: Secondary | ICD-10-CM | POA: Diagnosis present

## 2016-12-28 DIAGNOSIS — R079 Chest pain, unspecified: Secondary | ICD-10-CM

## 2016-12-28 DIAGNOSIS — I130 Hypertensive heart and chronic kidney disease with heart failure and stage 1 through stage 4 chronic kidney disease, or unspecified chronic kidney disease: Secondary | ICD-10-CM | POA: Diagnosis present

## 2016-12-28 DIAGNOSIS — N183 Chronic kidney disease, stage 3 (moderate): Secondary | ICD-10-CM | POA: Diagnosis not present

## 2016-12-28 DIAGNOSIS — Z841 Family history of disorders of kidney and ureter: Secondary | ICD-10-CM

## 2016-12-28 DIAGNOSIS — R748 Abnormal levels of other serum enzymes: Secondary | ICD-10-CM | POA: Diagnosis present

## 2016-12-28 DIAGNOSIS — I5021 Acute systolic (congestive) heart failure: Secondary | ICD-10-CM | POA: Diagnosis not present

## 2016-12-28 DIAGNOSIS — M545 Low back pain: Secondary | ICD-10-CM | POA: Diagnosis not present

## 2016-12-28 DIAGNOSIS — G4733 Obstructive sleep apnea (adult) (pediatric): Secondary | ICD-10-CM | POA: Diagnosis present

## 2016-12-28 DIAGNOSIS — R0789 Other chest pain: Secondary | ICD-10-CM | POA: Diagnosis present

## 2016-12-28 DIAGNOSIS — J42 Unspecified chronic bronchitis: Secondary | ICD-10-CM | POA: Diagnosis present

## 2016-12-28 DIAGNOSIS — K219 Gastro-esophageal reflux disease without esophagitis: Secondary | ICD-10-CM | POA: Diagnosis present

## 2016-12-28 DIAGNOSIS — Z79899 Other long term (current) drug therapy: Secondary | ICD-10-CM

## 2016-12-28 DIAGNOSIS — N179 Acute kidney failure, unspecified: Secondary | ICD-10-CM | POA: Diagnosis not present

## 2016-12-28 DIAGNOSIS — B9620 Unspecified Escherichia coli [E. coli] as the cause of diseases classified elsewhere: Secondary | ICD-10-CM | POA: Diagnosis present

## 2016-12-28 DIAGNOSIS — A419 Sepsis, unspecified organism: Secondary | ICD-10-CM | POA: Diagnosis present

## 2016-12-28 DIAGNOSIS — G8929 Other chronic pain: Secondary | ICD-10-CM | POA: Diagnosis present

## 2016-12-28 DIAGNOSIS — R112 Nausea with vomiting, unspecified: Secondary | ICD-10-CM | POA: Diagnosis not present

## 2016-12-28 DIAGNOSIS — N189 Chronic kidney disease, unspecified: Secondary | ICD-10-CM | POA: Diagnosis present

## 2016-12-28 DIAGNOSIS — Z6841 Body Mass Index (BMI) 40.0 and over, adult: Secondary | ICD-10-CM | POA: Diagnosis not present

## 2016-12-28 DIAGNOSIS — I493 Ventricular premature depolarization: Secondary | ICD-10-CM | POA: Diagnosis present

## 2016-12-28 DIAGNOSIS — Z7901 Long term (current) use of anticoagulants: Secondary | ICD-10-CM

## 2016-12-28 DIAGNOSIS — Z975 Presence of (intrauterine) contraceptive device: Secondary | ICD-10-CM

## 2016-12-28 DIAGNOSIS — L97919 Non-pressure chronic ulcer of unspecified part of right lower leg with unspecified severity: Secondary | ICD-10-CM | POA: Diagnosis not present

## 2016-12-28 DIAGNOSIS — N1832 Chronic kidney disease, stage 3b: Secondary | ICD-10-CM | POA: Diagnosis present

## 2016-12-28 DIAGNOSIS — Z8249 Family history of ischemic heart disease and other diseases of the circulatory system: Secondary | ICD-10-CM

## 2016-12-28 DIAGNOSIS — I83018 Varicose veins of right lower extremity with ulcer other part of lower leg: Secondary | ICD-10-CM | POA: Diagnosis present

## 2016-12-28 DIAGNOSIS — N289 Disorder of kidney and ureter, unspecified: Secondary | ICD-10-CM | POA: Diagnosis not present

## 2016-12-28 DIAGNOSIS — E876 Hypokalemia: Secondary | ICD-10-CM | POA: Diagnosis not present

## 2016-12-28 DIAGNOSIS — I429 Cardiomyopathy, unspecified: Secondary | ICD-10-CM | POA: Diagnosis present

## 2016-12-28 DIAGNOSIS — F419 Anxiety disorder, unspecified: Secondary | ICD-10-CM | POA: Diagnosis present

## 2016-12-28 DIAGNOSIS — Z86711 Personal history of pulmonary embolism: Secondary | ICD-10-CM

## 2016-12-28 DIAGNOSIS — E538 Deficiency of other specified B group vitamins: Secondary | ICD-10-CM | POA: Diagnosis present

## 2016-12-28 DIAGNOSIS — N12 Tubulo-interstitial nephritis, not specified as acute or chronic: Secondary | ICD-10-CM | POA: Diagnosis present

## 2016-12-28 DIAGNOSIS — N390 Urinary tract infection, site not specified: Secondary | ICD-10-CM | POA: Diagnosis not present

## 2016-12-28 DIAGNOSIS — R0602 Shortness of breath: Secondary | ICD-10-CM | POA: Diagnosis not present

## 2016-12-28 DIAGNOSIS — I319 Disease of pericardium, unspecified: Secondary | ICD-10-CM | POA: Diagnosis present

## 2016-12-28 DIAGNOSIS — Z79891 Long term (current) use of opiate analgesic: Secondary | ICD-10-CM

## 2016-12-28 DIAGNOSIS — Z885 Allergy status to narcotic agent status: Secondary | ICD-10-CM

## 2016-12-28 DIAGNOSIS — Z9049 Acquired absence of other specified parts of digestive tract: Secondary | ICD-10-CM

## 2016-12-28 DIAGNOSIS — L97811 Non-pressure chronic ulcer of other part of right lower leg limited to breakdown of skin: Secondary | ICD-10-CM | POA: Diagnosis present

## 2016-12-28 DIAGNOSIS — R51 Headache: Secondary | ICD-10-CM | POA: Diagnosis not present

## 2016-12-28 DIAGNOSIS — I959 Hypotension, unspecified: Secondary | ICD-10-CM | POA: Diagnosis present

## 2016-12-28 DIAGNOSIS — R339 Retention of urine, unspecified: Secondary | ICD-10-CM | POA: Diagnosis not present

## 2016-12-28 DIAGNOSIS — D649 Anemia, unspecified: Secondary | ICD-10-CM

## 2016-12-28 DIAGNOSIS — I129 Hypertensive chronic kidney disease with stage 1 through stage 4 chronic kidney disease, or unspecified chronic kidney disease: Secondary | ICD-10-CM | POA: Diagnosis not present

## 2016-12-28 DIAGNOSIS — I361 Nonrheumatic tricuspid (valve) insufficiency: Secondary | ICD-10-CM | POA: Diagnosis not present

## 2016-12-28 DIAGNOSIS — A4159 Other Gram-negative sepsis: Secondary | ICD-10-CM | POA: Diagnosis present

## 2016-12-28 DIAGNOSIS — A412 Sepsis due to unspecified staphylococcus: Secondary | ICD-10-CM | POA: Diagnosis not present

## 2016-12-28 DIAGNOSIS — E872 Acidosis: Secondary | ICD-10-CM | POA: Diagnosis present

## 2016-12-28 DIAGNOSIS — Z9884 Bariatric surgery status: Secondary | ICD-10-CM

## 2016-12-28 DIAGNOSIS — R0682 Tachypnea, not elsewhere classified: Secondary | ICD-10-CM | POA: Diagnosis not present

## 2016-12-28 DIAGNOSIS — Z792 Long term (current) use of antibiotics: Secondary | ICD-10-CM

## 2016-12-28 LAB — URINALYSIS, ROUTINE W REFLEX MICROSCOPIC
Bilirubin Urine: NEGATIVE
GLUCOSE, UA: NEGATIVE mg/dL
KETONES UR: NEGATIVE mg/dL
NITRITE: POSITIVE — AB
PH: 5 (ref 5.0–8.0)
Protein, ur: 100 mg/dL — AB
SPECIFIC GRAVITY, URINE: 1.012 (ref 1.005–1.030)

## 2016-12-28 LAB — I-STAT ARTERIAL BLOOD GAS, ED
Acid-base deficit: 3 mmol/L — ABNORMAL HIGH (ref 0.0–2.0)
Bicarbonate: 20.2 mmol/L (ref 20.0–28.0)
O2 SAT: 99 %
PH ART: 7.484 — AB (ref 7.350–7.450)
TCO2: 21 mmol/L (ref 0–100)
pCO2 arterial: 26.9 mmHg — ABNORMAL LOW (ref 32.0–48.0)
pO2, Arterial: 129 mmHg — ABNORMAL HIGH (ref 83.0–108.0)

## 2016-12-28 LAB — I-STAT BETA HCG BLOOD, ED (MC, WL, AP ONLY): HCG, QUANTITATIVE: 9.7 m[IU]/mL — AB (ref <5)

## 2016-12-28 LAB — BASIC METABOLIC PANEL
Anion gap: 10 (ref 5–15)
BUN: 35 mg/dL — ABNORMAL HIGH (ref 6–20)
CALCIUM: 8.4 mg/dL — AB (ref 8.9–10.3)
CHLORIDE: 107 mmol/L (ref 101–111)
CO2: 20 mmol/L — AB (ref 22–32)
CREATININE: 2.55 mg/dL — AB (ref 0.44–1.00)
GFR calc non Af Amer: 22 mL/min — ABNORMAL LOW (ref >60)
GFR, EST AFRICAN AMERICAN: 25 mL/min — AB (ref >60)
GLUCOSE: 115 mg/dL — AB (ref 65–99)
Potassium: 2.5 mmol/L — CL (ref 3.5–5.1)
Sodium: 137 mmol/L (ref 135–145)

## 2016-12-28 LAB — BRAIN NATRIURETIC PEPTIDE: B NATRIURETIC PEPTIDE 5: 58.3 pg/mL (ref 0.0–100.0)

## 2016-12-28 LAB — PROTIME-INR
INR: 2.96
Prothrombin Time: 31.4 seconds — ABNORMAL HIGH (ref 11.4–15.2)

## 2016-12-28 LAB — CBC
HCT: 25.9 % — ABNORMAL LOW (ref 36.0–46.0)
HEMOGLOBIN: 8.4 g/dL — AB (ref 12.0–15.0)
MCH: 21.9 pg — AB (ref 26.0–34.0)
MCHC: 32.4 g/dL (ref 30.0–36.0)
MCV: 67.6 fL — AB (ref 78.0–100.0)
PLATELETS: 252 10*3/uL (ref 150–400)
RBC: 3.83 MIL/uL — AB (ref 3.87–5.11)
RDW: 18.2 % — ABNORMAL HIGH (ref 11.5–15.5)
WBC: 12.3 10*3/uL — ABNORMAL HIGH (ref 4.0–10.5)

## 2016-12-28 LAB — RETICULOCYTES
RBC.: 3.93 MIL/uL (ref 3.87–5.11)
RETIC COUNT ABSOLUTE: 31.4 10*3/uL (ref 19.0–186.0)
RETIC CT PCT: 0.8 % (ref 0.4–3.1)

## 2016-12-28 LAB — MAGNESIUM: Magnesium: 1.9 mg/dL (ref 1.7–2.4)

## 2016-12-28 LAB — I-STAT TROPONIN, ED: TROPONIN I, POC: 0.01 ng/mL (ref 0.00–0.08)

## 2016-12-28 LAB — D-DIMER, QUANTITATIVE: D-Dimer, Quant: 1.04 ug/mL-FEU — ABNORMAL HIGH (ref 0.00–0.50)

## 2016-12-28 MED ORDER — ALBUTEROL SULFATE (2.5 MG/3ML) 0.083% IN NEBU: 2.5000 mg | INHALATION_SOLUTION | RESPIRATORY_TRACT | Status: DC | PRN

## 2016-12-28 MED ORDER — NITROGLYCERIN 0.4 MG SL SUBL: 0.4000 mg | SUBLINGUAL_TABLET | SUBLINGUAL | Status: DC | PRN

## 2016-12-28 MED ORDER — VITAMIN D 1000 UNITS PO TABS
5000.0000 [IU] | ORAL_TABLET | Freq: Every day | ORAL | Status: DC
  Administered 2016-12-29 – 2017-01-03 (×6): 5000 [IU] via ORAL
  Filled 2016-12-28 (×6): qty 5

## 2016-12-28 MED ORDER — ASPIRIN 81 MG PO CHEW: 324.0000 mg | CHEWABLE_TABLET | ORAL | Status: AC

## 2016-12-28 MED ORDER — ONDANSETRON HCL 4 MG/2ML IJ SOLN: 4.0000 mg | Freq: Four times a day (QID) | INTRAMUSCULAR | Status: DC | PRN

## 2016-12-28 MED ORDER — TECHNETIUM TC 99M DIETHYLENETRIAME-PENTAACETIC ACID
31.6000 | Freq: Once | INTRAVENOUS | Status: AC | PRN
  Administered 2016-12-28: 31.6 via RESPIRATORY_TRACT

## 2016-12-28 MED ORDER — WARFARIN - PHARMACIST DOSING INPATIENT: Freq: Every day | Status: DC

## 2016-12-28 MED ORDER — ONDANSETRON HCL 4 MG/2ML IJ SOLN
4.0000 mg | Freq: Once | INTRAMUSCULAR | Status: AC
  Administered 2016-12-28: 4 mg via INTRAVENOUS
  Filled 2016-12-28: qty 2

## 2016-12-28 MED ORDER — LORAZEPAM 2 MG/ML IJ SOLN
0.5000 mg | Freq: Once | INTRAMUSCULAR | Status: AC
  Administered 2016-12-28: 0.5 mg via INTRAVENOUS
  Filled 2016-12-28: qty 1

## 2016-12-28 MED ORDER — SODIUM CHLORIDE 0.9 % IV BOLUS (SEPSIS)
500.0000 mL | Freq: Once | INTRAVENOUS | Status: AC
  Administered 2016-12-28: 500 mL via INTRAVENOUS

## 2016-12-28 MED ORDER — ASPIRIN EC 81 MG PO TBEC
81.0000 mg | DELAYED_RELEASE_TABLET | Freq: Every day | ORAL | Status: DC
Start: 2016-12-29 — End: 2017-01-03
  Administered 2016-12-29 – 2017-01-02 (×5): 81 mg via ORAL
  Filled 2016-12-28 (×5): qty 1

## 2016-12-28 MED ORDER — BUPROPION HCL ER (XL) 150 MG PO TB24
150.0000 mg | ORAL_TABLET | Freq: Every day | ORAL | Status: DC
  Administered 2016-12-29 – 2017-01-03 (×6): 150 mg via ORAL
  Filled 2016-12-28 (×6): qty 1

## 2016-12-28 MED ORDER — ACETAMINOPHEN 325 MG PO TABS
650.0000 mg | ORAL_TABLET | ORAL | Status: DC | PRN
  Administered 2016-12-29 – 2016-12-31 (×5): 650 mg via ORAL
  Filled 2016-12-28 (×5): qty 2

## 2016-12-28 MED ORDER — GABAPENTIN 300 MG PO CAPS
300.0000 mg | ORAL_CAPSULE | Freq: Every day | ORAL | Status: DC
  Administered 2016-12-28 – 2017-01-02 (×6): 300 mg via ORAL
  Filled 2016-12-28 (×6): qty 1

## 2016-12-28 MED ORDER — SODIUM CHLORIDE 0.9 % IV SOLN
INTRAVENOUS | Status: AC
  Administered 2016-12-28: 21:00:00 via INTRAVENOUS

## 2016-12-28 MED ORDER — FENTANYL CITRATE (PF) 100 MCG/2ML IJ SOLN
50.0000 ug | Freq: Once | INTRAMUSCULAR | Status: AC
  Administered 2016-12-28: 50 ug via INTRAVENOUS
  Filled 2016-12-28: qty 2

## 2016-12-28 MED ORDER — POTASSIUM CHLORIDE 10 MEQ/100ML IV SOLN
10.0000 meq | INTRAVENOUS | Status: AC
  Administered 2016-12-28 (×2): 10 meq via INTRAVENOUS
  Filled 2016-12-28 (×2): qty 100

## 2016-12-28 MED ORDER — TECHNETIUM TO 99M ALBUMIN AGGREGATED
4.2400 | Freq: Once | INTRAVENOUS | Status: AC | PRN
  Administered 2016-12-28: 4.24 via INTRAVENOUS

## 2016-12-28 MED ORDER — POTASSIUM CHLORIDE CRYS ER 20 MEQ PO TBCR
40.0000 meq | EXTENDED_RELEASE_TABLET | Freq: Two times a day (BID) | ORAL | Status: DC
  Administered 2016-12-28 – 2017-01-01 (×8): 40 meq via ORAL
  Filled 2016-12-28 (×8): qty 2

## 2016-12-28 MED ORDER — ASPIRIN 300 MG RE SUPP: 300.0000 mg | RECTAL | Status: AC

## 2016-12-28 MED ORDER — WARFARIN SODIUM 2.5 MG PO TABS
12.5000 mg | ORAL_TABLET | Freq: Once | ORAL | Status: AC
  Administered 2016-12-28: 12.5 mg via ORAL
  Filled 2016-12-28: qty 1

## 2016-12-28 MED ORDER — OXYCODONE HCL 5 MG PO TABS
5.0000 mg | ORAL_TABLET | ORAL | Status: DC | PRN
  Administered 2016-12-28: 10 mg via ORAL
  Administered 2016-12-29 (×2): 5 mg via ORAL
  Administered 2016-12-30 (×2): 10 mg via ORAL
  Administered 2016-12-30: 5 mg via ORAL
  Administered 2016-12-31 – 2017-01-02 (×5): 10 mg via ORAL
  Filled 2016-12-28: qty 2
  Filled 2016-12-28: qty 1
  Filled 2016-12-28 (×7): qty 2
  Filled 2016-12-28 (×2): qty 1

## 2016-12-28 NOTE — ED Provider Notes (Signed)
[facility] DEPT Provider Note   CSN: [id number] Arrival date & time: 12/28/16  1652     History   Chief Complaint Chief Complaint  Patient presents with  . Back Pain  . Chest Pain  . Shortness of Breath    HPI Alice [name] is a 46 y.o. female.  HPI Alice [name] is a 46 y.o. female with history of chronic pain, chronic bronchitis, PE, DVT, acid reflux, hypertension, anemia, presents to emergency department complaining of shortness of breath, chest pain, back pain. Patient states her symptoms began yesterday. States pain moves from left side to the right. She reports pain with deep breathing. She reports associated shortness of breath and difficulty "getting an air." She reports history of pulmonary embolism and states this feels the same. She is on Coumadin and takes it daily without missing any doses. Denies any cough or congestion. No fever or chills. Upon EMS arrival blood pressure is 60 systolic. Improved without any intervention. Patient hyperventilating. States "it just hurts to breathe."  Past Medical History:  Diagnosis Date  . Anemia   . Anxiety   . Chronic bronchitis ([place])   . Chronic upper back pain   . Depression   . DVT (deep venous thrombosis) (HCC)    BLE  . GERD (gastroesophageal reflux disease)   . Headache    "weekly" (04/16/2015)  . Hypertension   . Migraine    "monthly" (04/16/2015)  . Obesity   . Pneumonia 2014  . Pulmonary embolism ([place])   . Sinusitis nasal   . Sleep apnea    "I'm suppose to wear a mask but I don't" (04/16/2015)  . Varicose veins of right lower extremity   . Venous stasis of lower extremity    right    Patient Active Problem List   Diagnosis Date Noted  . Hx of skin graft 08/01/2016  . 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 ([place]) 07/25/2016  . 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 Trichomonal infection 11/24/2015  . Abdominal pain  05/02/2015  . Symptomatic cholelithiasis 04/16/2015  . Acute bilateral upper abdominal pain 04/16/2015  . Anemia 05/18/2013  . Hypotension, unspecified 05/17/2013  . CKD (chronic kidney disease), stage III 05/17/2013  . Anal fissure 02/06/2013  . Anal skin tag 02/06/2013  . ALLERGIC RHINITIS 03/05/2010  . LOW BACK PAIN SYNDROME 12/13/2007  . WOUND INFECTION 10/10/2007  . OBESITY, MORBID 12/02/2006  . HTN (hypertension) 12/02/2006  . History of pulmonary embolism 12/02/2006  . History of DVT (deep vein thrombosis) 12/02/2006  . 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 GASTROESOPHAGEAL REFLUX DISEASE 12/02/2006  . OSA (obstructive sleep apnea) 12/02/2006    Past Surgical History:  Procedure Laterality Date  . CHOLECYSTECTOMY N/A 04/18/2015   Procedure: LAPAROSCOPIC CHOLECYSTECTOMY;  Surgeon: Ralene Ok, MD;  Location: [facility];  Service: General;  Laterality: N/A;  . HYSTEROSCOPY W/D&C  12/24/2001   Archie Endo 09/28/2010  . I&D EXTREMITY Right 07/25/2016   Procedure: IRRIGATION AND DEBRIDEMENT RIGHT LEG ULCER, APPLY VERAFLO VAC;  Surgeon: [name], MD;  Location: [facility];  Service: Orthopedics;  Laterality: Right;  . INCISE AND DRAIN ABCESS Right 07/14/2016  . INCISION AND DRAINAGE Right 09/10/2008   leg:  skin and soft tissue and muscle/notes 09/15/2010  . INCISION AND DRAINAGE Right 01/01/2008   Chronic venous stasis insufficiency ulcer,/notes 09/14/2010/  . INCISION AND DRAINAGE Right 08/979   calf w/application wound vac/notes  07/20/2010  . INCISION AND DRAINAGE Right 07/25/2016   IRRIGATION AND DEBRIDEMENT RIGHT LEG ULCER,  . LAPAROSCOPIC GASTRIC BYPASS  ~ 2007  . SKIN GRAFT SPLIT THICKNESS LEG / FOOT Right 07/25/2016   LEG  . SKIN SPLIT GRAFT Right 07/27/2016   Procedure: SKIN GRAFT RIGHT LEG WITH THERASKIN APPLICATION;  Surgeon: [name], MD;  Location: [facility];  Service: Orthopedics;  Laterality: Right;  . TONSILLECTOMY      OB History    Gravida Para Term  Preterm AB Living   0 0 0 0 0 0   SAB TAB Ectopic Multiple Live Births   0 0 0 0         Home Medications    Prior to Admission medications   Medication Sig Start Date End Date Taking? Authorizing Provider  albuterol (PROVENTIL HFA;VENTOLIN HFA) 108 (90 Base) MCG/ACT inhaler Inhale 3 puffs into the lungs every 6 (six) hours as needed for wheezing or shortness of breath.    [provider]  amLODipine (NORVASC) 10 MG tablet Take 1 tablet (10 mg total) by mouth daily. 05/20/13   [name], MD  amoxicillin (AMOXIL) 500 MG capsule TK 1 C PO  TID UNTIL FINISHED 10/07/16   [provider]  buPROPion (WELLBUTRIN XL) 150 MG 24 hr tablet Take 150 mg by mouth daily.    [provider]  Cholecalciferol (VITAMIN D3) 5000 units CAPS Take 1 capsule by mouth daily.     [provider]  clindamycin (CLEOCIN) 300 MG capsule Take 1 capsule (300 mg total) by mouth 4 (four) times daily. X 7 days 10/02/16   [name], MD  gabapentin (NEURONTIN) 300 MG capsule Take 300 mg by mouth at bedtime.  04/13/16   [provider]  hydrochlorothiazide (HYDRODIURIL) 25 MG tablet Take 25 mg by mouth daily.    [provider]  levonorgestrel (MIRENA) 20 MCG/24HR IUD 1 each by Intrauterine route once. Placed in 2013.    [provider]  oxyCODONE (ROXICODONE) 5 MG immediate release tablet Take 1-2 tablets (5-10 mg total) by mouth every 4 (four) hours as needed for severe pain. 10/02/16   [name], MD  phenazopyridine (PYRIDIUM) 200 MG tablet Take 1 tablet (200 mg total) by mouth 3 (three) times daily. 11/18/15   Rasch, Anderson Malta I, NP  phentermine 37.5 MG capsule Take 37.5 mg by mouth daily.    [provider]  Potassium Chloride ER 20 MEQ TBCR TK 1 T PO  BID 10/02/16   [provider]  potassium chloride SA (K-DUR,KLOR-CON) 20 MEQ tablet Take 1 tablet (20 mEq total) by mouth 2 (two) times daily. 10/02/16   [name], MD    sulindac (CLINORIL) 200 MG tablet Take 200 mg by mouth 3 (three) times daily.  06/09/16   [provider]  warfarin (COUMADIN) 2.5 MG tablet Take 5 tablets (12.5 mg total) by mouth one time only at 6 PM. Take daily until INR checked and then per PCP. Patient taking differently: Take 10 mg by mouth daily.  05/20/13   [name], MD    Family History Family History  Problem Relation Age of Onset  . Kidney disease Mother        kidney transplant    Social History Social History  Substance Use Topics  . Smoking status: Never Smoker  . Smokeless tobacco: Never Used  . Alcohol use No     Allergies   Oxycodone hcl and Vicodin [hydrocodone-acetaminophen]  Review of Systems Review of Systems  Constitutional: Negative for chills and fever.  Respiratory: Positive for shortness of breath. Negative for cough and chest tightness.   Cardiovascular: Positive for chest pain. Negative for palpitations and leg swelling.  Gastrointestinal: Negative for abdominal pain, diarrhea, nausea and vomiting.  Genitourinary: Negative for dysuria, flank pain, pelvic pain, vaginal bleeding, vaginal discharge and vaginal pain.  Musculoskeletal: Positive for back pain. Negative for arthralgias, myalgias, neck pain and neck stiffness.  Skin: Negative for rash.  Neurological: Negative for dizziness, weakness and headaches.  All other systems reviewed and are negative.    Physical Exam Updated Vital Signs BP (!) 92/57 (BP Location: Right Arm)   Pulse (!) 104   Temp 98.9 F (37.2 C) (Oral)   Resp (!) 25   Ht 5\' 8"  (1.727 m)   Wt (!) 154.2 kg (340 lb)   SpO2 100%   BMI 51.70 kg/m   Physical Exam  Constitutional: She appears well-developed and well-nourished.  HENT:  Head: Normocephalic.  Eyes: Conjunctivae are normal.  Neck: Neck supple.  Cardiovascular: Normal rate, regular rhythm and normal heart sounds.   Pulmonary/Chest:  Patient's tachypneic, lungs are clear. Chest is  diffusely tender, mainly over the lower ribs bilaterally extending into the lower ribs in the back.  Abdominal: Soft. Bowel sounds are normal. She exhibits no distension. There is no tenderness. There is no rebound.  Musculoskeletal: She exhibits no edema.  Neurological: She is alert.  Skin: Skin is warm and dry.  Psychiatric: She has a normal mood and affect. Her behavior is normal.  Nursing note and vitals reviewed.    ED Treatments / Results  Labs (all labs ordered are listed, but only abnormal results are displayed) Labs Reviewed  BASIC METABOLIC PANEL - Abnormal; Notable for the following:       Result Value   Potassium 2.5 (*)    CO2 20 (*)    Glucose, Bld 115 (*)    BUN 35 (*)    Creatinine, Ser 2.55 (*)    Calcium 8.4 (*)    GFR calc non Af Amer 22 (*)    GFR calc Af Amer 25 (*)    All other components within normal limits  CBC - Abnormal; Notable for the following:    WBC 12.3 (*)    RBC 3.83 (*)    Hemoglobin 8.4 (*)    HCT 25.9 (*)    MCV 67.6 (*)    MCH 21.9 (*)    RDW 18.2 (*)    All other components within normal limits  D-DIMER, QUANTITATIVE (NOT AT [facility]) - Abnormal; Notable for the following:    D-Dimer, Quant 1.04 (*)    All other components within normal limits  PROTIME-INR - Abnormal; Notable for the following:    Prothrombin Time 31.4 (*)    All other components within normal limits  I-STAT BETA HCG BLOOD, ED ([facility], [facility], [facility] ONLY) - Abnormal; Notable for the following:    I-stat hCG, quantitative 9.7 (*)    All other components within normal limits  I-STAT ARTERIAL BLOOD GAS, ED - Abnormal; Notable for the following:    pH, Arterial 7.484 (*)    pCO2 arterial 26.9 (*)    pO2, Arterial 129.0 (*)    Acid-base deficit 3.0 (*)    All other components within normal limits  BRAIN NATRIURETIC PEPTIDE  MAGNESIUM  HIV ANTIBODY (ROUTINE TESTING)  TROPONIN I  TROPONIN I  TROPONIN I  VITAMIN B12  FOLATE  IRON AND TIBC  FERRITIN  RETICULOCYTES    URINALYSIS, ROUTINE W REFLEX MICROSCOPIC  CBC  PROTIME-INR  I-STAT TROPONIN, ED    EKG  EKG Interpretation  Date/Time:  Wednesday December 28 2016 16:53:05 EDT Ventricular Rate:  100 PR Interval:    QRS Duration: 88 QT Interval:  301 QTC Calculation: 389 R Axis:   18 Text Interpretation:  Sinus tachycardia Ventricular premature complex Abnormal R-wave progression, early transition Inferior infarct, age indeterminate Confirmed by Lacretia Leigh [phone]) on 12/28/2016 5:49:29 PM       Radiology US Renal  Result Date: 12/28/2016 CLINICAL DATA:  Acute kidney injury, pain in chills for 2 days EXAM: RENAL / URINARY TRACT ULTRASOUND COMPLETE COMPARISON:  05/02/2015, 05/17/2013 FINDINGS: Right Kidney: Length: 10.9 cm. Echogenicity within normal limits. No mass or hydronephrosis visualized. Left Kidney: Length: 11.7 cm. Echogenicity within normal limits. No mass or hydronephrosis visualized. Bladder: Appears normal for degree of bladder distention. IMPRESSION: Negative renal ultrasound Electronically Signed   By: Donavan Foil M.D.   On: 12/28/2016 22:05   Nm Pulmonary Vent And Perf (v/q Scan)  Result Date: 12/28/2016 CLINICAL DATA:  Chest pain and shortness of Breath EXAM: NUCLEAR MEDICINE VENTILATION - PERFUSION LUNG SCAN TECHNIQUE: Ventilation images were obtained in multiple projections using inhaled aerosol Tc-11m DTPA. Perfusion images were obtained in multiple projections after intravenous injection of Tc-68m MAA. RADIOPHARMACEUTICALS:  31.6 mCi Technetium-40m DTPA aerosol inhalation and 4.24 mCi Technetium-61m MAA IV COMPARISON:  Chest x-ray from earlier in the same day. FINDINGS: Ventilation: No focal ventilation defect. Perfusion: Lateral imaging could not be performed due the patient's size and cold for although the remaining perfusion images show no significant perfusion defect. IMPRESSION: No evidence of pulmonary embolism. Electronically Signed   By: Inez Catalina M.D.   On: 12/28/2016  19:40   Dg Chest Portable 1 View  Result Date: 12/28/2016 CLINICAL DATA:  Shortness of breath and chest pain EXAM: PORTABLE CHEST 1 VIEW COMPARISON:  April 19, 2015 FINDINGS: Lungs are clear. Heart is borderline enlarged with pulmonary vascularity within normal limits. No adenopathy. No pneumothorax. No bone lesions. IMPRESSION: Borderline cardiac enlargement.  No edema or consolidation. Electronically Signed   By: Lowella Grip III M.D.   On: 12/28/2016 17:09    Procedures Procedures (including critical care time)  Medications Ordered in ED Medications  fentaNYL (SUBLIMAZE) injection 50 mcg (not administered)  ondansetron (ZOFRAN) injection 4 mg (not administered)     Initial Impression / Assessment and Plan / ED Course  I have reviewed the triage vital signs and the nursing notes.  Pertinent labs & imaging results that were available during my care of the patient were reviewed by me and considered in my medical decision making (see chart for details).     Patient in emergency department with chest pain, back pain, shortness of breath. Symptoms began yesterday, worsening now. History of PEs, on Coumadin. Will get chest x-ray, labs, INR, troponin, EKG. Will provide with pain medications. Will monitor. Vital signs are normal at this time. Pt is hyperventilating, but states "it just hurts really bad."  5:58 PM Patient is labs showing renal failure, creatinine of 2.55 today. Her baseline is around 1.2. Potassium 2.5, 3 runs of potassium IV ordered. Troponin is negative. Chest x-ray negative. Patient slightly improved after fentanyl, but continues to be tachypneic. Discussed with radiology, this time I'm unable to get CT of her chest with contrast to evaluate for possible dissection versus PE. I was explained by radiologist that  noncontrast CT would not show any evidence of dissection so it would not be helpful. Patient will need a VQ scan to rule out pulmonary embolism. He was approved  by Dr. Karene Fry. Also added ABG. I asked pending.  VQ scan is negative. I discussed patient with hospitalist, they will admit her. Patient feels slightly improved. Although blood pressure has been soft, vital signs signs overall remained all within normal.  Vitals:   12/28/16 2115 12/28/16 2145 12/28/16 2200 12/28/16 2215  BP: (!) 102/58 113/63 125/70 107/62  Pulse: 96 98 97 96  Resp: 18 20 (!) 27 18  Temp:      TempSrc:      SpO2: 100% 100% 100% 100%  Weight:      Height:         Final Clinical Impressions(s) / ED Diagnoses   Final diagnoses:  Acute renal failure, unspecified acute renal failure type (HCC)  Chest pain, unspecified type  Hypokalemia  Anemia, unspecified type    New Prescriptions New Prescriptions   No medications on file     Jeannett Senior, PA-C 12/28/16 2236

## 2016-12-28 NOTE — ED Notes (Signed)
Tatyana notified of pt's BP 90s/50s and she ordered to give pt a 523ml NS bolus.

## 2016-12-28 NOTE — Progress Notes (Signed)
ANTICOAGULATION CONSULT NOTE - Initial Consult  Pharmacy Consult for warfarin Indication: pulmonary embolus  Allergies  Allergen Reactions  . Oxycodone Hcl Nausea Only  . Vicodin [Hydrocodone-Acetaminophen] Nausea And Vomiting    Patient Measurements: Height: 5\' 8"  (172.7 cm) Weight: (!) 340 lb (154.2 kg) IBW/kg (Calculated) : 63.9   Vital Signs: Temp: 98.9 F (37.2 C) (08/15 1647) Temp Source: Oral (08/15 1647) BP: 94/56 (08/15 1945) Pulse Rate: 94 (08/15 1945)  Labs:  Recent Labs  12/28/16 1704  HGB 8.4*  HCT 25.9*  PLT 252  LABPROT 31.4*  INR 2.96  CREATININE 2.55*    Estimated Creatinine Clearance: 44.4 mL/min (A) (by C-G formula based on SCr of 2.55 mg/dL (H)).   Medical History: Past Medical History:  Diagnosis Date  . Anemia   . Anxiety   . Chronic bronchitis ([place])   . Chronic upper back pain   . Depression   . DVT (deep venous thrombosis) (HCC)    BLE  . GERD (gastroesophageal reflux disease)   . Headache    "weekly" (04/16/2015)  . Hypertension   . Migraine    "monthly" (04/16/2015)  . Obesity   . Pneumonia 2014  . Pulmonary embolism ([place])   . Sinusitis nasal   . Sleep apnea    "I'm suppose to wear a mask but I don't" (04/16/2015)  . Varicose veins of right lower extremity   . Venous stasis of lower extremity    right    Medications:  Scheduled:  . aspirin  324 mg Oral NOW   Or  . aspirin  300 mg Rectal NOW  . [START ON 12/29/2016] aspirin EC  81 mg Oral Daily  . [START ON 12/29/2016] buPROPion  150 mg Oral Daily  . gabapentin  300 mg Oral QHS  . potassium chloride SA  40 mEq Oral BID  . [START ON 12/29/2016] Vitamin D3  1 capsule Oral Daily    Assessment: 36 YOF presenting with dyspnea and chest pain, taking warfarin PTA for history of PE.  PTA dose verified as 12.5mg  daily, with no bleeding noted at this time.  INR: 2.96 - therapeutic (high end), however med hx verified PTA dose with pt and pharmacy  Goal of Therapy:  INR  2-3 Monitor platelets by anticoagulation protocol: Yes   Plan:  Warfarin 12.5mg  x 1 Monitor INR and CBC daily, s/s bleeding   Carnella Guadalajara 12/28/2016,8:46 PM

## 2016-12-28 NOTE — ED Notes (Signed)
Dr Zenia Resides in room

## 2016-12-28 NOTE — ED Notes (Signed)
This RN in [place] with patient to reassess after bolus of 500NS. Pt's BP 100/39. Pt is alert and oriented x 4. Appears to be more comfortable. SPO2 100% on 3L. Pt tolerating VQ scan well.

## 2016-12-28 NOTE — ED Notes (Signed)
RT in room.

## 2016-12-28 NOTE — ED Notes (Signed)
Heating packs applied to pt's left back upper and lower.

## 2016-12-28 NOTE — ED Notes (Signed)
Pt taken to [facility] 

## 2016-12-28 NOTE — H&P (Signed)
History and Physical    Alice [name]:[id number] DOB: April 30, 1971 DOA: 12/28/2016  PCP: Benito Mccreedy, MD   Patient coming from: Home  Chief Complaint: Chest pain, SOB, nausea, vomiting   HPI: Alice [name] is a 46 y.o. 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 Patient reports that she had been in her usual state of health until 2 days ago when she developed the acute onset of pain across the anterior chest. She was at rest at the time and describes the pain as sharp, severe, constant, present across both sides of the chest, and with no alleviating or exacerbating factors identified. She reports concomitant development of dyspnea, but no cough. She reports that the symptoms were constant and she called EMS yesterday for evaluation, but refused transport to the hospital at that time. Symptoms continued through the day today, not worse than earlier, but no better, and she again called EMS out. She reports chills for the past 2 days, but denies cough, dysuria, or flank pain. Denies abdominal pain or diarrhea. She reports nausea with nonbloody nonbilious vomiting for the last 2 days. Patient reportedly had a systolic blood pressure of 62 on EMS arrival.  ED Course: Upon arrival to the ED, patient is found to be afebrile, saturating well on room air, tachypneic, slightly tachycardic, and with blood pressure 92/57. EKG features a sinus tachycardia with rate 100 and PVCs. Chest x-ray is notable for borderline cardiomegaly without edema or pneumonia. Chemistry panel reveals a potassium of 2.5, BUN 35, and creatinine of 2.55, up from 1.15 in May 2018. CBC is notable for a leukocytosis of 12,300 and a microcytic anemia with hemoglobin of 8.4, down from 9.9 in May, and down from 11.2 and March  2018. MCV is 67.6. INR is within the therapeutic range at 2.96, troponin is unremarkable and BNP is also normal. D-dimer is elevated to 1.04. VQ scan was obtained and negative for PE. Patient was treated with 500 mL normal saline, 20 mEq IV potassium, multiple doses of IV fentanyl, and IV Ativan in the ED. She reported some subjective improvement with this, remained hemodynamically stable, and will be admitted to the stepdown unit for ongoing evaluation and management of chest pain and shortness of breath with nausea, vomiting, acute kidney injury, and worsening microcytic anemia.  Review of Systems:  All other systems reviewed and apart from HPI, are negative.  Past Medical History:  Diagnosis Date  . Anemia   . Anxiety   . Chronic bronchitis ([place])   . Chronic upper back pain   . Depression   . DVT (deep venous thrombosis) ([facility])    BLE  . GERD (gastroesophageal reflux disease)   . Headache    "weekly" (04/16/2015)  . Hypertension   . Migraine    "monthly" (04/16/2015)  . Obesity   . Pneumonia 2014  . Pulmonary embolism ([place])   . Sinusitis nasal   . Sleep apnea    "I'm suppose to wear a mask but I don't" (04/16/2015)  . Varicose veins of right lower extremity   . Venous stasis of lower extremity    right    Past Surgical History:  Procedure Laterality Date  . CHOLECYSTECTOMY N/A 04/18/2015   Procedure: LAPAROSCOPIC CHOLECYSTECTOMY;  Surgeon: Ralene Ok, MD;  Location: [facility];  Service: General;  Laterality: N/A;  . HYSTEROSCOPY W/D&C  12/24/2001   Archie Endo 09/28/2010  . I&D EXTREMITY Right 07/25/2016   Procedure: IRRIGATION AND DEBRIDEMENT RIGHT LEG ULCER, APPLY VERAFLO VAC;  Surgeon: [name], MD;  Location: [facility];  Service: Orthopedics;  Laterality: Right;  . INCISE AND DRAIN ABCESS Right 07/14/2016  . INCISION AND DRAINAGE Right 09/10/2008   leg:  skin and soft tissue and muscle/notes 09/15/2010  . INCISION AND DRAINAGE Right 01/01/2008   Chronic venous stasis insufficiency  ulcer,/notes 09/14/2010/  . INCISION AND DRAINAGE Right 07/5463   calf w/application wound vac/notes 07/20/2010  . INCISION AND DRAINAGE Right 07/25/2016   IRRIGATION AND DEBRIDEMENT RIGHT LEG ULCER,  . LAPAROSCOPIC GASTRIC BYPASS  ~ 2007  . SKIN GRAFT SPLIT THICKNESS LEG / FOOT Right 07/25/2016   LEG  . SKIN SPLIT GRAFT Right 07/27/2016   Procedure: SKIN GRAFT RIGHT LEG WITH THERASKIN APPLICATION;  Surgeon: [name], MD;  Location: [facility];  Service: Orthopedics;  Laterality: Right;  . TONSILLECTOMY       reports that she has never smoked. She has never used smokeless tobacco. She reports that she does not drink alcohol or use drugs.  Allergies  Allergen Reactions  . Oxycodone Hcl Nausea Only  . Vicodin [Hydrocodone-Acetaminophen] Nausea And Vomiting    Family History  Problem Relation Age of Onset  . Kidney disease Mother        kidney transplant     Prior to Admission medications   Medication Sig Start Date End Date Taking? Authorizing Provider  albuterol (PROVENTIL HFA;VENTOLIN HFA) 108 (90 Base) MCG/ACT inhaler Inhale 3 puffs into the lungs every 6 (six) hours as needed for wheezing or shortness of breath.    [provider]  amLODipine (NORVASC) 10 MG tablet Take 1 tablet (10 mg total) by mouth daily. 05/20/13   [name], MD  amoxicillin (AMOXIL) 500 MG capsule TK 1 C PO  TID UNTIL FINISHED 10/07/16   [provider]  buPROPion (WELLBUTRIN XL) 150 MG 24 hr tablet Take 150 mg by mouth daily.    [provider]  Cholecalciferol (VITAMIN D3) 5000 units CAPS Take 1 capsule by mouth daily.     [provider]  clindamycin (CLEOCIN) 300 MG capsule Take 1 capsule (300 mg total) by mouth 4 (four) times daily. X 7 days 10/02/16   [name], MD  gabapentin (NEURONTIN) 300 MG capsule Take 300 mg by mouth at bedtime.  04/13/16   [provider]  hydrochlorothiazide (HYDRODIURIL) 25 MG tablet Take 25 mg by mouth daily.    [provider]  levonorgestrel (MIRENA) 20 MCG/24HR IUD 1 each by Intrauterine route once. Placed in 2013.    [provider]  oxyCODONE (ROXICODONE) 5 MG immediate release tablet Take 1-2 tablets (5-10 mg total) by mouth every 4 (four) hours as needed for severe pain. 10/02/16   [name], MD  phenazopyridine (PYRIDIUM) 200 MG tablet Take 1 tablet (200 mg total) by mouth 3 (three) times daily. 11/18/15   Rasch, Anderson Malta I, NP  phentermine 37.5 MG capsule Take 37.5 mg by mouth daily.    [provider]  Potassium Chloride ER 20 MEQ TBCR TK 1 T PO  BID 10/02/16   [provider]  potassium chloride SA (K-DUR,KLOR-CON) 20 MEQ tablet Take 1 tablet (20 mEq total) by mouth 2 (two) times daily. 10/02/16   [name], MD  sulindac (CLINORIL) 200 MG tablet Take 200 mg by mouth 3 (  three) times daily.  06/09/16   [provider]  warfarin (COUMADIN) 2.5 MG tablet Take 5 tablets (12.5 mg total) by mouth one time only at 6 PM. Take daily until INR checked and then per PCP. Patient taking differently: Take 10 mg by mouth daily.  05/20/13   [name], MD    Physical Exam: Vitals:   12/28/16 1845 12/28/16 1900 12/28/16 1942 12/28/16 1945  BP: (!) 84/63 (!) 100/39 103/64 (!) 94/56  Pulse: 92 93 95 94  Resp: (!) 24 19 19  (!) 22  Temp:      TempSrc:      SpO2: 100% 100% 100% 100%  Weight:      Height:          Constitutional: NAD, calm, obese, in apparent dicomfort Eyes: PERTLA, lids and conjunctivae normal ENMT: Mucous membranes are moist. Posterior pharynx clear of any exudate or lesions.   Neck: normal, supple, no masses, no thyromegaly Respiratory: clear to auscultation bilaterally, no wheezing, no crackles. Normal respiratory effort. Cardiovascular: S1 & S2 heard, regular rate and rhythm. Trace pedal edema bilaterally. No significant JVD. Abdomen: No distension, no tenderness, no masses palpated. Bowel sounds active.  Musculoskeletal: no clubbing /  cyanosis. No joint deformity upper and lower extremities.  Skin: no significant rashes, lesions, ulcers. Warm, dry, well-perfused. Neurologic: CN 2-12 grossly intact. Sensation intact, DTR normal. Strength 5/5 in all 4 limbs.  Psychiatric: Alert and oriented x 3. Calm, cooperative.     Labs on Admission: I have personally reviewed following labs and imaging studies  CBC:  Recent Labs Lab 12/28/16 1704  WBC 12.3*  HGB 8.4*  HCT 25.9*  MCV 67.6*  PLT 902   Basic Metabolic Panel:  Recent Labs Lab 12/28/16 1704  NA 137  K 2.5*  CL 107  CO2 20*  GLUCOSE 115*  BUN 35*  CREATININE 2.55*  CALCIUM 8.4*  MG 1.9   GFR: Estimated Creatinine Clearance: 44.4 mL/min (A) (by C-G formula based on SCr of 2.55 mg/dL (H)). Liver Function Tests: No results for input(s): AST, ALT, ALKPHOS, BILITOT, PROT, ALBUMIN in the last 168 hours. No results for input(s): LIPASE, AMYLASE in the last 168 hours. No results for input(s): AMMONIA in the last 168 hours. Coagulation Profile:  Recent Labs Lab 12/28/16 1704  INR 2.96   Cardiac Enzymes: No results for input(s): CKTOTAL, CKMB, CKMBINDEX, TROPONINI in the last 168 hours. BNP (last 3 results) No results for input(s): PROBNP in the last 8760 hours. HbA1C: No results for input(s): HGBA1C in the last 72 hours. CBG: No results for input(s): GLUCAP in the last 168 hours. Lipid Profile: No results for input(s): CHOL, HDL, LDLCALC, TRIG, CHOLHDL, LDLDIRECT in the last 72 hours. Thyroid Function Tests: No results for input(s): TSH, T4TOTAL, FREET4, T3FREE, THYROIDAB in the last 72 hours. Anemia Panel: No results for input(s): VITAMINB12, FOLATE, FERRITIN, TIBC, IRON, RETICCTPCT in the last 72 hours. Urine analysis:    Component Value Date/Time   COLORURINE YELLOW 11/18/2015 1850   APPEARANCEUR CLEAR 11/18/2015 1850   LABSPEC 1.015 11/18/2015 1850   PHURINE 5.5 11/18/2015 1850   GLUCOSEU NEGATIVE 11/18/2015 1850   HGBUR LARGE (A)  11/18/2015 1850   BILIRUBINUR NEGATIVE 11/18/2015 1850   KETONESUR NEGATIVE 11/18/2015 1850   PROTEINUR 30 (A) 11/18/2015 1850   UROBILINOGEN 4.0 (H) 11/25/2013 1511   NITRITE NEGATIVE 11/18/2015 1850   LEUKOCYTESUR NEGATIVE 11/18/2015 1850   Sepsis Labs: @LABRCNTIP (procalcitonin:4,lacticidven:4) )No results found for this or any previous visit (  from the past 240 hour(s)).   Radiological Exams on Admission: Nm Pulmonary Vent And Perf (v/q Scan)  Result Date: 12/28/2016 CLINICAL DATA:  Chest pain and shortness of Breath EXAM: NUCLEAR MEDICINE VENTILATION - PERFUSION LUNG SCAN TECHNIQUE: Ventilation images were obtained in multiple projections using inhaled aerosol Tc-36m DTPA. Perfusion images were obtained in multiple projections after intravenous injection of Tc-34m MAA. RADIOPHARMACEUTICALS:  31.6 mCi Technetium-62m DTPA aerosol inhalation and 4.24 mCi Technetium-66m MAA IV COMPARISON:  Chest x-ray from earlier in the same day. FINDINGS: Ventilation: No focal ventilation defect. Perfusion: Lateral imaging could not be performed due the patient's size and cold for although the remaining perfusion images show no significant perfusion defect. IMPRESSION: No evidence of pulmonary embolism. Electronically Signed   By: Inez Catalina M.D.   On: 12/28/2016 19:40   Dg Chest Portable 1 View  Result Date: 12/28/2016 CLINICAL DATA:  Shortness of breath and chest pain EXAM: PORTABLE CHEST 1 VIEW COMPARISON:  April 19, 2015 FINDINGS: Lungs are clear. Heart is borderline enlarged with pulmonary vascularity within normal limits. No adenopathy. No pneumothorax. No bone lesions. IMPRESSION: Borderline cardiac enlargement.  No edema or consolidation. Electronically Signed   By: Lowella Grip III M.D.   On: 12/28/2016 17:09    EKG: Independently reviewed. Sinus tachycardia (rate 100), PVC's.   Assessment/Plan  1. Chest pain, SOB  - Pt presents with 2 days of constant and severe pain across the chest,  associated with SOB  - EMS reports SBP 62 upon their arrival  - EKG with slight tachycardia and PVC's, CXR unremarkable, troponin 0.01, and d-dimer 1.04  - PE excluded with V/Q scan  - Plan to continue cardiac monitoring, obtain serial troponin measurements, repeat EKG, treat with ASA 324 mg    2. Acute kidney injury superimposed on CKD stage III - SCr is 2.55 on admission, up from 1.15 in May '18  - Possibly a prerenal azotemia in light of her recent vomiting  - Check renal US, continue IVF hydration, avoid nephrotoxins, repeat chem panel in am    3. Hypokalemia  - Serum potassium is 2.5 on admission, mag level wnl - She was treated in ED with 20 mEq IV potassium  - Taking 20 mEq oral potassium BID at home, will increase to 40 mEq BID for now  - Continue cardiac monitoring, repeat chem panel in am    4. Microcytic anemia  - Hgb is 8.4 on admission, down from 9.9 in May '18 and 11.2 in March '18  - MCV is 67.6, and gradually falling over the past year  - Plan to check anemia panel and FOBT's   5. Hypotension, hx of hypertension  - SBP was reportedly 62 for EMS, 92 on arrival in ED  - BP has remained on lower end of normal in ED after two 500 cc NS boluses  - Hold Norvasc and HCTZ, continue IVF hydration, continue cardiac eval as above    6. Hx of DVT and PE  - No evidence for acute VTE  - INR is therapeutic on admission  - Continue warfarin with pharmacy assistance     DVT prophylaxis: warfarin Code Status: Full  Family Communication: Discussed with patient Disposition Plan: Admit to SDU Consults called: None Admission status: Inpatient    Vianne Bulls, MD [facility] Pager [phone]  If 7PM-7AM, please contact night-coverage [email or web address] Password [email or web address]  12/28/2016, 8:12 PM

## 2016-12-28 NOTE — ED Triage Notes (Signed)
Per EMS, pt from home with chest pain and shob x 2 days, worse today. Pt has hx of DVT's and PE and states that this feels the same. Pt initially hypotensive 62 BP palpated, then after 250 ml of NS 112/76, HR 108 sinus tach, spo2 98%, RR 30, CBG 132. Pt appears to be in obvious discomfort, tachypneic and unable to speak in complete sentences. RA sat here 99%. Placed on 3L [place] for comfort. Hypotensive.

## 2016-12-28 NOTE — ED Notes (Signed)
Pt taken to US

## 2016-12-28 NOTE — ED Provider Notes (Signed)
Medical screening examination/treatment/procedure(s) were conducted as a shared visit with non-physician practitioner(s) and myself.  I personally evaluated the patient during the encounter.   EKG Interpretation  Date/Time:  Wednesday December 28 2016 16:53:05 EDT Ventricular Rate:  100 PR Interval:    QRS Duration: 88 QT Interval:  301 QTC Calculation: 389 R Axis:   18 Text Interpretation:  Sinus tachycardia Ventricular premature complex Abnormal R-wave progression, early transition Inferior infarct, age indeterminate Confirmed by Lacretia Leigh [phone]) on 12/28/2016 5:49:56 PM     46 year old female presents complaining of intermittent dyspnea as well as chest pain times several days. No anginal type symptoms. Does have a prior history of PE and has been compliant with the Coumadin. Chest x-ray is negative here. Does have elevated d-dimer. Also has evidence of hyperkalemia as well as acute kidney injury. We'll have VQ scan and admitted to the hospital   Lacretia Leigh, MD 12/28/16 [phone]

## 2016-12-29 ENCOUNTER — Ambulatory Visit (INDEPENDENT_AMBULATORY_CARE_PROVIDER_SITE_OTHER): Payer: Medicare Other | Admitting: Orthopedic Surgery

## 2016-12-29 ENCOUNTER — Inpatient Hospital Stay (HOSPITAL_COMMUNITY): Payer: Medicare Other

## 2016-12-29 DIAGNOSIS — I361 Nonrheumatic tricuspid (valve) insufficiency: Secondary | ICD-10-CM

## 2016-12-29 DIAGNOSIS — N189 Chronic kidney disease, unspecified: Secondary | ICD-10-CM

## 2016-12-29 DIAGNOSIS — I83019 Varicose veins of right lower extremity with ulcer of unspecified site: Secondary | ICD-10-CM

## 2016-12-29 DIAGNOSIS — A419 Sepsis, unspecified organism: Secondary | ICD-10-CM | POA: Diagnosis present

## 2016-12-29 DIAGNOSIS — N179 Acute kidney failure, unspecified: Secondary | ICD-10-CM

## 2016-12-29 DIAGNOSIS — N390 Urinary tract infection, site not specified: Secondary | ICD-10-CM | POA: Diagnosis present

## 2016-12-29 DIAGNOSIS — L97919 Non-pressure chronic ulcer of unspecified part of right lower leg with unspecified severity: Secondary | ICD-10-CM

## 2016-12-29 LAB — BASIC METABOLIC PANEL
ANION GAP: 8 (ref 5–15)
BUN: 38 mg/dL — ABNORMAL HIGH (ref 6–20)
CALCIUM: 7.8 mg/dL — AB (ref 8.9–10.3)
CO2: 21 mmol/L — AB (ref 22–32)
CREATININE: 3.81 mg/dL — AB (ref 0.44–1.00)
Chloride: 108 mmol/L (ref 101–111)
GFR, EST AFRICAN AMERICAN: 16 mL/min — AB (ref >60)
GFR, EST NON AFRICAN AMERICAN: 13 mL/min — AB (ref >60)
Glucose, Bld: 117 mg/dL — ABNORMAL HIGH (ref 65–99)
Potassium: 3.1 mmol/L — ABNORMAL LOW (ref 3.5–5.1)
SODIUM: 137 mmol/L (ref 135–145)

## 2016-12-29 LAB — CBC
HCT: 24.8 % — ABNORMAL LOW (ref 36.0–46.0)
HCT: 24 % — ABNORMAL LOW (ref 36.0–46.0)
Hemoglobin: 8.1 g/dL — ABNORMAL LOW (ref 12.0–15.0)
Hemoglobin: 7.8 g/dL — ABNORMAL LOW (ref 12.0–15.0)
MCH: 21.9 pg — AB (ref 26.0–34.0)
MCH: 21.8 pg — AB (ref 26.0–34.0)
MCHC: 32.7 g/dL (ref 30.0–36.0)
MCHC: 32.5 g/dL (ref 30.0–36.0)
MCV: 67 fL — ABNORMAL LOW (ref 78.0–100.0)
MCV: 67 fL — ABNORMAL LOW (ref 78.0–100.0)
PLATELETS: 224 10*3/uL (ref 150–400)
PLATELETS: 208 10*3/uL (ref 150–400)
RBC: 3.7 MIL/uL — AB (ref 3.87–5.11)
RBC: 3.58 MIL/uL — AB (ref 3.87–5.11)
RDW: 17.8 % — AB (ref 11.5–15.5)
RDW: 18 % — AB (ref 11.5–15.5)
WBC: 12.8 10*3/uL — AB (ref 4.0–10.5)
WBC: 15.2 10*3/uL — AB (ref 4.0–10.5)

## 2016-12-29 LAB — MAGNESIUM: MAGNESIUM: 1.8 mg/dL (ref 1.7–2.4)

## 2016-12-29 LAB — PROTIME-INR
INR: 2.97
INR: 3.23
PROTHROMBIN TIME: 33.7 s — AB (ref 11.4–15.2)
Prothrombin Time: 31.6 seconds — ABNORMAL HIGH (ref 11.4–15.2)

## 2016-12-29 LAB — IRON AND TIBC
Iron: 7 ug/dL — ABNORMAL LOW (ref 28–170)
Saturation Ratios: 2 % — ABNORMAL LOW (ref 10.4–31.8)
TIBC: 330 ug/dL (ref 250–450)
UIBC: 323 ug/dL

## 2016-12-29 LAB — GLUCOSE, CAPILLARY: GLUCOSE-CAPILLARY: 119 mg/dL — AB (ref 65–99)

## 2016-12-29 LAB — VITAMIN B12: VITAMIN B 12: 173 pg/mL — AB (ref 180–914)

## 2016-12-29 LAB — ECHOCARDIOGRAM COMPLETE
HEIGHTINCHES: 68 in
WEIGHTICAEL: 5065.29 [oz_av]

## 2016-12-29 LAB — HIV ANTIBODY (ROUTINE TESTING W REFLEX): HIV SCREEN 4TH GENERATION: NONREACTIVE

## 2016-12-29 LAB — MRSA PCR SCREENING: MRSA by PCR: NEGATIVE

## 2016-12-29 LAB — TROPONIN I
TROPONIN I: 0.05 ng/mL — AB (ref <0.03)
Troponin I: 0.05 ng/mL (ref <0.03)
Troponin I: 0.05 ng/mL (ref <0.03)

## 2016-12-29 LAB — LACTIC ACID, PLASMA: LACTIC ACID, VENOUS: 1.2 mmol/L (ref 0.5–1.9)

## 2016-12-29 LAB — FERRITIN: Ferritin: 61 ng/mL (ref 11–307)

## 2016-12-29 LAB — FOLATE: FOLATE: 7.7 ng/mL (ref >5.9)

## 2016-12-29 LAB — PROCALCITONIN: PROCALCITONIN: 42.89 ng/mL

## 2016-12-29 MED ORDER — COLCHICINE 0.6 MG PO TABS
0.6000 mg | ORAL_TABLET | Freq: Every day | ORAL | Status: DC
  Administered 2016-12-29 – 2016-12-30 (×2): 0.6 mg via ORAL
  Filled 2016-12-29 (×2): qty 1

## 2016-12-29 MED ORDER — SODIUM CHLORIDE 0.9 % IV BOLUS (SEPSIS): 1000.0000 mL | Freq: Once | INTRAVENOUS | Status: DC

## 2016-12-29 MED ORDER — WARFARIN SODIUM 2.5 MG PO TABS
12.5000 mg | ORAL_TABLET | Freq: Once | ORAL | Status: AC
  Administered 2016-12-29: 12.5 mg via ORAL
  Filled 2016-12-29: qty 1

## 2016-12-29 MED ORDER — SODIUM CHLORIDE 0.9 % IV BOLUS (SEPSIS)
1000.0000 mL | Freq: Once | INTRAVENOUS | Status: AC
Start: 2016-12-29 — End: 2016-12-30
  Administered 2016-12-29: 1000 mL via INTRAVENOUS

## 2016-12-29 MED ORDER — SODIUM CHLORIDE 0.9 % IV SOLN
INTRAVENOUS | Status: DC
  Administered 2016-12-29 – 2017-01-01 (×4): via INTRAVENOUS

## 2016-12-29 MED ORDER — HYDROMORPHONE HCL 1 MG/ML IJ SOLN: 1.0000 mg | INTRAMUSCULAR | Status: DC | PRN

## 2016-12-29 MED ORDER — PANTOPRAZOLE SODIUM 40 MG IV SOLR
40.0000 mg | Freq: Two times a day (BID) | INTRAVENOUS | Status: DC
  Administered 2016-12-29 – 2016-12-30 (×3): 40 mg via INTRAVENOUS
  Filled 2016-12-29 (×3): qty 40

## 2016-12-29 MED ORDER — DEXTROSE 5 % IV SOLN
1.0000 g | INTRAVENOUS | Status: DC
  Administered 2016-12-29: 1 g via INTRAVENOUS
  Filled 2016-12-29: qty 10

## 2016-12-29 NOTE — Progress Notes (Signed)
RN attempted to assess patients right calf wound, patient refused  multiple times to let RN remove home dressing stating "only Dr. Sharol Given can change it." Patient was educated on the importance of initial assessment on admission to the hospital wound care consult placed. Will continue to monitor.

## 2016-12-29 NOTE — Consult Note (Addendum)
Cardiology Consult    Patient ID: [name] MRN: [id number], DOB/AGE: 1970-05-23   Admit date: 12/28/2016 Date of Consult: 12/29/2016  Primary Physician: [facility], Michae Kava, MD Primary Cardiologist: New Requesting Provider: Dr. Tana Coast Reason for Consultation: Positive Troponin  Alice [name] is a 46 y.o. female who is being seen today for the evaluation of positive troponin/chest pain at the request of Dr. Tana Coast.   Patient Profile    46 year old female with past medical history of DVT, PE on Coumadin, hypertension, obesity, anemia and right lower extremity wound who presented with chest pain and shortness of breath.  Past Medical History   Past Medical History:  Diagnosis Date  . Anemia   . Anxiety   . Chronic bronchitis ([place])   . Chronic upper back pain   . Depression   . DVT (deep venous thrombosis) ([facility])    BLE  . GERD (gastroesophageal reflux disease)   . Headache    "weekly" (04/16/2015)  . Hypertension   . Migraine    "monthly" (04/16/2015)  . Obesity   . Pneumonia 2014  . Pulmonary embolism ([place])   . Sinusitis nasal   . Sleep apnea    "I'm suppose to wear a mask but I don't" (04/16/2015)  . Varicose veins of right lower extremity   . Venous stasis of lower extremity    right    Past Surgical History:  Procedure Laterality Date  . CHOLECYSTECTOMY N/A 04/18/2015   Procedure: LAPAROSCOPIC CHOLECYSTECTOMY;  Surgeon: Ralene Ok, MD;  Location: [facility];  Service: General;  Laterality: N/A;  . HYSTEROSCOPY W/D&C  12/24/2001   Archie Endo 09/28/2010  . I&D EXTREMITY Right 07/25/2016   Procedure: IRRIGATION AND DEBRIDEMENT RIGHT LEG ULCER, APPLY VERAFLO VAC;  Surgeon: [name], MD;  Location: [facility];  Service: Orthopedics;  Laterality: Right;  . INCISE AND DRAIN ABCESS Right 07/14/2016  . INCISION AND DRAINAGE Right 09/10/2008   leg:  skin and soft tissue and muscle/notes 09/15/2010  . INCISION AND DRAINAGE Right 01/01/2008   Chronic venous stasis insufficiency  ulcer,/notes 09/14/2010/  . INCISION AND DRAINAGE Right 12/1155   calf w/application wound vac/notes 07/20/2010  . INCISION AND DRAINAGE Right 07/25/2016   IRRIGATION AND DEBRIDEMENT RIGHT LEG ULCER,  . LAPAROSCOPIC GASTRIC BYPASS  ~ 2007  . SKIN GRAFT SPLIT THICKNESS LEG / FOOT Right 07/25/2016   LEG  . SKIN SPLIT GRAFT Right 07/27/2016   Procedure: SKIN GRAFT RIGHT LEG WITH THERASKIN APPLICATION;  Surgeon: [name], MD;  Location: [facility];  Service: Orthopedics;  Laterality: Right;  . TONSILLECTOMY       Allergies  Allergies  Allergen Reactions  . Oxycodone Hcl Nausea Only  . Vicodin [Hydrocodone-Acetaminophen] Nausea And Vomiting    History of Present Illness    Alice [name] is a 46 year old female with past medical history of DVT, PE on Coumadin, hypertension, obesity, anemia and right lower extremity wound.  Reports never having seen a cardiologist or had cardiac workup in the past. Does report family history of CAD with mother having MI at the age of 1, and "heart surgery". States she has been followed by orthopedics for a right lower extremity wound for several months. States she's been in her usual state of health up until about 2 days prior to admission. Reports chest pain with radiation through to her upper back, fever, chills and diaphoresis. Also reports intermittent nausea, but no vomiting. Reports calling EMS the day prior to admission, but refused transportation to the  hospital that time. States the following day she felt no better, and called EMS again.  In the ED her labs showed potassium of 2.5, creatinine 2.5, WBC 12.3, hemoglobin 8.4. INR 2.9. She is also noted to be febrile and tachypneic and tachycardiac and hypotensive with systolic pressures in the 60s on EMS arrival. D-dimer was noted at 1.04. She was started on IV fluids, given IV potassium and underwent VQ scan. VQ scan was negative for PE. BNP was normal. Troponin 0.05>> 0.05. EKG showed sinus rhythm with known Q  waves in lead 3, and PVCs. Cardiology was consulted in regards to chest pain and mildly elevated troponin.  Inpatient Medications    . aspirin  324 mg Oral NOW   Or  . aspirin  300 mg Rectal NOW  . aspirin EC  81 mg Oral Daily  . buPROPion  150 mg Oral Daily  . cholecalciferol  5,000 Units Oral Daily  . gabapentin  300 mg Oral QHS  . pantoprazole (PROTONIX) IV  40 mg Intravenous Q12H  . potassium chloride SA  40 mEq Oral BID  . warfarin  12.5 mg Oral ONCE-1800  . Warfarin - Pharmacist Dosing Inpatient   Does not apply q1800    Family History    Family History  Problem Relation Age of Onset  . Kidney disease Mother        kidney transplant    Social History    Social History   Social History  . Marital status: Single    Spouse name: N/A  . Number of children: N/A  . Years of education: N/A   Occupational History  . Not on file.   Social History Main Topics  . Smoking status: Never Smoker  . Smokeless tobacco: Never Used  . Alcohol use No  . Drug use: No  . Sexual activity: Yes    Partners: Male    Birth control/ protection: IUD   Other Topics Concern  . Not on file   Social History Narrative  . No narrative on file     Review of Systems    See history of present illness All other systems reviewed and are otherwise negative except as noted above.  Physical Exam    Blood pressure (!) 102/46, pulse (!) 103, temperature (!) 103 F (39.4 C), temperature source Oral, resp. rate (!) 0, height 5\' 8"  (1.727 m), weight (!) 316 lb 9.3 oz (143.6 kg), SpO2 99 %.  General: Pleasant obese African-American female, NAD Psych: Normal affect. Neuro: Alert and oriented X 3. Moves all extremities spontaneously. HEENT: Normal  Neck: Supple without bruits or JVD. Lungs:  Resp regular and unlabored, CTA. Heart: RRR no s3, s4, or murmurs. Abdomen: Soft, non-tender, non-distended, BS + x 4.  Extremities: No clubbing, cyanosis or edema. DP/PT/Radials 2+ and equal  bilaterally. Right lower extremity with dressing in place.  Labs    Troponin [facility] of Care Test)  Recent Labs  12/28/16 1709  TROPIPOC 0.01    Recent Labs  12/28/16 2240 12/29/16 0210  TROPONINI 0.05* 0.05*   Lab Results  Component Value Date   WBC 12.8 (H) 12/29/2016   HGB 8.1 (L) 12/29/2016   HCT 24.8 (L) 12/29/2016   MCV 67.0 (L) 12/29/2016   PLT 224 12/29/2016    Recent Labs Lab 12/28/16 1704  NA 137  K 2.5*  CL 107  CO2 20*  BUN 35*  CREATININE 2.55*  CALCIUM 8.4*  GLUCOSE 115*   Lab Results  Component Value  Date   CHOL 153 09/14/2007   HDL 52 09/14/2007   LDLCALC 92 09/14/2007   TRIG 46 09/14/2007   Lab Results  Component Value Date   DDIMER 1.04 (H) 12/28/2016     Radiology Studies    US Renal  Result Date: 12/28/2016 CLINICAL DATA:  Acute kidney injury, pain in chills for 2 days EXAM: RENAL / URINARY TRACT ULTRASOUND COMPLETE COMPARISON:  05/02/2015, 05/17/2013 FINDINGS: Right Kidney: Length: 10.9 cm. Echogenicity within normal limits. No mass or hydronephrosis visualized. Left Kidney: Length: 11.7 cm. Echogenicity within normal limits. No mass or hydronephrosis visualized. Bladder: Appears normal for degree of bladder distention. IMPRESSION: Negative renal ultrasound Electronically Signed   By: Donavan Foil M.D.   On: 12/28/2016 22:05   Nm Pulmonary Vent And Perf (v/q Scan)  Result Date: 12/28/2016 CLINICAL DATA:  Chest pain and shortness of Breath EXAM: NUCLEAR MEDICINE VENTILATION - PERFUSION LUNG SCAN TECHNIQUE: Ventilation images were obtained in multiple projections using inhaled aerosol Tc-70m DTPA. Perfusion images were obtained in multiple projections after intravenous injection of Tc-13m MAA. RADIOPHARMACEUTICALS:  31.6 mCi Technetium-71m DTPA aerosol inhalation and 4.24 mCi Technetium-45m MAA IV COMPARISON:  Chest x-ray from earlier in the same day. FINDINGS: Ventilation: No focal ventilation defect. Perfusion: Lateral imaging could not  be performed due the patient's size and cold for although the remaining perfusion images show no significant perfusion defect. IMPRESSION: No evidence of pulmonary embolism. Electronically Signed   By: Inez Catalina M.D.   On: 12/28/2016 19:40   Dg Chest Portable 1 View  Result Date: 12/28/2016 CLINICAL DATA:  Shortness of breath and chest pain EXAM: PORTABLE CHEST 1 VIEW COMPARISON:  April 19, 2015 FINDINGS: Lungs are clear. Heart is borderline enlarged with pulmonary vascularity within normal limits. No adenopathy. No pneumothorax. No bone lesions. IMPRESSION: Borderline cardiac enlargement.  No edema or consolidation. Electronically Signed   By: Lowella Grip III M.D.   On: 12/28/2016 17:09    ECG & Cardiac Imaging    EKG: Sinus rhythm with PVCs and known Q waves in lead 3.  Echo: 6/09  - Overall left ventricular systolic function was vigorous. Left    ventricular ejection fraction was estimated , range being 65    % to 75 %. There was no diagnostic evidence of left    ventricular regional wall motion abnormalities. Left    ventricular wall thickness was moderately increased. Left    ventricular diastolic function parameters were normal. - There was mild mitral valvular regurgitation. - The left atrium was mildly dilated. - Right ventricular size was at the upper limits of normal. The    estimated peak right ventricular systolic pressure was mildly    increased.  Assessment & Plan    46 year old female with past medical history of DVT, PE on Coumadin, hypertension, obesity, anemia and right lower extremity wound who presented with chest pain and shortness of breath.  1. Elevated troponin: Troponin noted at 0.052. EKG nonacute. Patient reports chest pain along with fever and chills 2 days prior to admission. She has a history of hypertension, but is a nonsmoker and nondiabetic. Troponin elevation noted in the setting of sepsis, with hypotension,  tachycardia and fever. Suspect elevated troponin is secondary to demand ischemia. -- Obtain echo -- Given current acute state would not pursue ischemic workup at this time. Pending results of echo could pursue outpatient ischemic workup.  2. Sepsis: Positive for UTI admission, and known chronic wound on right lower extremity. White blood cell  count mildly elevated on admission, but noted to be hypotensive and tachycardic along with febrile. -- Workup per primary  3. DVT/PE: Reports being on chronic Coumadin which is managed by her PCP. INR today 2.97 -- Coumadin per pharmacy  4. Hypotension: Home medications include amlodipine 10 mg, and hydrochlorothiazide 25 mg.  -- Currently held given her hypotensive state  5. Anemia: Hemoglobin 8.4 on admission which is down from 9.9 on 5/18. Further drop in hemoglobin to 8.1 today -- Follow CBC  6. Right lower extremity wound: States this is been present for several months, and is currently followed by orthopedics with Dr. Sharol Given. Currently will not let admitting team our nursing staff evaluate wound.  --appears Dr. Sharol Given has been consulted by primary team  7. AKI on CKD: Baseline creatinine appears to be between 1.1-1.2. Admission creatinine 2.5.  -- Currently being treated with IV fluids  Signed, Reino Bellis, NP-C Pager [phone] 12/29/2016, 2:35 PM   The patient was seen, examined and discussed with Reino Bellis, NP-C and I agree with the above.    46 year old female with past medical history of DVT, PE on Coumadin, hypertension, obesity, anemia and right lower extremity wound.  No prior cardiac history. She presented with sharp left sided chest pain that worsens with inspiration and with position changes.  Reports chest pain with radiation through to her upper back, it started 2 days ago and was associated with fever and chills and diaphoresis. Also reports intermittent nausea, but no vomiting.   In the ED her labs showed potassium of  2.5-->3.1, creatinine 2.5->3.8 (baseline 1.0), WBC 12.3, hemoglobin 8.4-->7.8. INR 2.9-->3.2. She is also noted to be febrile and tachypneic and tachycardiac and hypotensive with systolic pressures in the 60s on EMS arrival. She was started on IV fluids, given IV potassium and underwent VQ scan. VQ scan was negative for PE. BNP was normal. Troponin 0.05>> 0.05.   EKG showed sinus rhythm with PVCs, LVEF 45-50% with mild diffuse hypokinesis with no identifiable regional variations. LVEF 65-70% in 2009.   She is being treated for UTI sepsis. Febrile. Troponin elevation minimal and most probably sec to sepsis and anemia. New cardiomyopathy - unable to start ACEI or BB in the settings of sepsis and hypotension.  We will plan for a stress test or coronary CTA once the patient is recovered from an acute illness. Start colchicine for presumed pericarditis.  Transfuse PRBC to keep Hb > 9. Anemia workup per primary team.  Ena Dawley, MD 12/29/2016

## 2016-12-29 NOTE — Progress Notes (Signed)
In and out cath for urine C/S. Patient tolerated well. Moderate amount of white drainage with odor noted.

## 2016-12-29 NOTE — ED Notes (Signed)
Back from downtime : Pt. sleeping with no distress, respirations unlabored , IV sites intact .

## 2016-12-29 NOTE — Progress Notes (Signed)
  Echocardiogram 2D Echocardiogram has been performed.  Alice [name] 12/29/2016, 4:15 PM

## 2016-12-29 NOTE — Consult Note (Signed)
ORTHOPAEDIC CONSULTATION  REQUESTING PHYSICIAN: Rai, Vernelle Emerald, MD  Chief Complaint: Sepsis with chronic venous ulcer right leg rule out infection from the leg.  HPI: Alice [name] is a 46 y.o. female who presents with sepsis. Patient has undergone chronic therapy for a chronic venous stasis ulcer to the right lower extremity. Most recently she has undergone instillation wound VAC therapy with allograft skin graft followed by compression wraps. Patient is currently undergoing serial compression wraps weekly.  Past Medical History:  Diagnosis Date  . Anemia   . Anxiety   . Chronic bronchitis ([place])   . Chronic upper back pain   . Depression   . DVT (deep venous thrombosis) ([facility])    BLE  . GERD (gastroesophageal reflux disease)   . Headache    "weekly" (04/16/2015)  . Hypertension   . Migraine    "monthly" (04/16/2015)  . Obesity   . Pneumonia 2014  . Pulmonary embolism ([place])   . Sinusitis nasal   . Sleep apnea    "I'm suppose to wear a mask but I don't" (04/16/2015)  . Varicose veins of right lower extremity   . Venous stasis of lower extremity    right   Past Surgical History:  Procedure Laterality Date  . CHOLECYSTECTOMY N/A 04/18/2015   Procedure: LAPAROSCOPIC CHOLECYSTECTOMY;  Surgeon: Ralene Ok, MD;  Location: [facility];  Service: General;  Laterality: N/A;  . HYSTEROSCOPY W/D&C  12/24/2001   Archie Endo 09/28/2010  . I&D EXTREMITY Right 07/25/2016   Procedure: IRRIGATION AND DEBRIDEMENT RIGHT LEG ULCER, APPLY VERAFLO VAC;  Surgeon: [name], MD;  Location: [facility];  Service: Orthopedics;  Laterality: Right;  . INCISE AND DRAIN ABCESS Right 07/14/2016  . INCISION AND DRAINAGE Right 09/10/2008   leg:  skin and soft tissue and muscle/notes 09/15/2010  . INCISION AND DRAINAGE Right 01/01/2008   Chronic venous stasis insufficiency ulcer,/notes 09/14/2010/  . INCISION AND DRAINAGE Right 06/3298   calf w/application wound vac/notes 07/20/2010  . INCISION AND DRAINAGE Right  07/25/2016   IRRIGATION AND DEBRIDEMENT RIGHT LEG ULCER,  . LAPAROSCOPIC GASTRIC BYPASS  ~ 2007  . SKIN GRAFT SPLIT THICKNESS LEG / FOOT Right 07/25/2016   LEG  . SKIN SPLIT GRAFT Right 07/27/2016   Procedure: SKIN GRAFT RIGHT LEG WITH THERASKIN APPLICATION;  Surgeon: [name], MD;  Location: [place] [facility];  Service: Orthopedics;  Laterality: Right;  . TONSILLECTOMY     Social History   Social History  . Marital status: Single    Spouse name: N/A  . Number of children: N/A  . Years of education: N/A   Social History Main Topics  . Smoking status: Never Smoker  . Smokeless tobacco: Never Used  . Alcohol use No  . Drug use: No  . Sexual activity: Yes    Partners: Male    Birth control/ protection: IUD   Other Topics Concern  . None   Social History Narrative  . None   Family History  Problem Relation Age of Onset  . Kidney disease Mother        kidney transplant   - negative except otherwise stated in the family history section Allergies  Allergen Reactions  . Oxycodone Hcl Nausea Only  . Vicodin [Hydrocodone-Acetaminophen] Nausea And Vomiting   Prior to Admission medications   Medication Sig Start Date End Date Taking? Authorizing Provider  albuterol (PROVENTIL HFA;VENTOLIN HFA) 108 (90 Base) MCG/ACT inhaler Inhale 2-3 puffs into the lungs every 6 (six) hours as  needed for wheezing or shortness of breath.    Yes [provider]  amLODipine (NORVASC) 10 MG tablet Take 1 tablet (10 mg total) by mouth daily. 05/20/13  Yes [name], MD  buPROPion (WELLBUTRIN XL) 150 MG 24 hr tablet Take 150 mg by mouth daily.   Yes [provider]  hydrochlorothiazide (HYDRODIURIL) 25 MG tablet Take 25 mg by mouth daily.   Yes [provider]  ibuprofen (ADVIL,MOTRIN) 200 MG tablet Take 200-600 mg by mouth every 6 (six) hours as needed (for pain or headaches).    Yes [provider]  levonorgestrel (MIRENA) 20 MCG/24HR IUD 1 each by Intrauterine  route once.    Yes [provider]  warfarin (COUMADIN) 2.5 MG tablet Take 5 tablets (12.5 mg total) by mouth one time only at 6 PM. Take daily until INR checked and then per PCP. Patient taking differently: Take 12.5 mg by mouth at bedtime.  05/20/13  Yes [name], MD   US Renal  Result Date: 12/28/2016 CLINICAL DATA:  Acute kidney injury, pain in chills for 2 days EXAM: RENAL / URINARY TRACT ULTRASOUND COMPLETE COMPARISON:  05/02/2015, 05/17/2013 FINDINGS: Right Kidney: Length: 10.9 cm. Echogenicity within normal limits. No mass or hydronephrosis visualized. Left Kidney: Length: 11.7 cm. Echogenicity within normal limits. No mass or hydronephrosis visualized. Bladder: Appears normal for degree of bladder distention. IMPRESSION: Negative renal ultrasound Electronically Signed   By: Donavan Foil M.D.   On: 12/28/2016 22:05   Nm Pulmonary Vent And Perf (v/q Scan)  Result Date: 12/28/2016 CLINICAL DATA:  Chest pain and shortness of Breath EXAM: NUCLEAR MEDICINE VENTILATION - PERFUSION LUNG SCAN TECHNIQUE: Ventilation images were obtained in multiple projections using inhaled aerosol Tc-23m DTPA. Perfusion images were obtained in multiple projections after intravenous injection of Tc-31m MAA. RADIOPHARMACEUTICALS:  31.6 mCi Technetium-35m DTPA aerosol inhalation and 4.24 mCi Technetium-77m MAA IV COMPARISON:  Chest x-ray from earlier in the same day. FINDINGS: Ventilation: No focal ventilation defect. Perfusion: Lateral imaging could not be performed due the patient's size and cold for although the remaining perfusion images show no significant perfusion defect. IMPRESSION: No evidence of pulmonary embolism. Electronically Signed   By: Inez Catalina M.D.   On: 12/28/2016 19:40   Dg Chest Portable 1 View  Result Date: 12/28/2016 CLINICAL DATA:  Shortness of breath and chest pain EXAM: PORTABLE CHEST 1 VIEW COMPARISON:  April 19, 2015 FINDINGS: Lungs are clear. Heart is borderline  enlarged with pulmonary vascularity within normal limits. No adenopathy. No pneumothorax. No bone lesions. IMPRESSION: Borderline cardiac enlargement.  No edema or consolidation. Electronically Signed   By: Lowella Grip III M.D.   On: 12/28/2016 17:09   - pertinent xrays, CT, MRI studies were reviewed and independently interpreted  Positive ROS: All other systems have been reviewed and were otherwise negative with the exception of those mentioned in the HPI and as above.  Physical Exam: General: Alert, moderate distress Psychiatric: Patient is competent for consent with normal mood and affect Lymphatic: No axillary or cervical lymphadenopathy Cardiovascular: No pedal edema Respiratory: No cyanosis, no use of accessory musculature GI: No organomegaly, abdomen is soft and non-tender  Skin: The compression wrap is removed. There is no purulence no odor there is some clear drainage. The venous stasis ulcer has beefy healthy granulation tissue there is no signs of infection.   Neurologic: Patient does not have protective sensation bilateral lower extremities.   MUSCULOSKELETAL:  Examination the patient's right lower extremity there  is a staple remaining in the skin graft wound bed. The skin wrinkles well there is no signs of cellulitis or infection no purulence. The wound bed has healthy beefy granulation tissue.  Assessment: Assessment: Well healing venous stasis ulcer right lower extremity status post skin graft.  Plan: Plan: We will continue with compression wraps changed daily. I will follow-up in the office at time of discharge to apply our specialized compression wrap.  Thank you for the consult and the opportunity to see Ms. Deirdre Evener, [place] [phone] 5:04 PM

## 2016-12-29 NOTE — Progress Notes (Signed)
Text sent to Dr. Tana Coast re: patient b/p and questioned of lab for potassium needed to be rechecked

## 2016-12-29 NOTE — Consult Note (Addendum)
[facility] Nurse wound consult note Reason for Consult: Consult requested for right leg.  Pt states she has a full thickness wound which is followed by Dr Sharol Given of the ortho service as an outpatient.  She informed me she is wearing an [place] compression wrap which can only be removed and assessed by him, and declines a wound consult.  Please request a consult from Dr Sharol Given for further plan of care. Please re-consult if further assistance is needed.  Thank-you,  Julien Girt MSN, RN, CWOCN, CWCN-AP, CNS

## 2016-12-29 NOTE — Progress Notes (Signed)
Notified MD that patient labs are complete. No new order at this time.

## 2016-12-29 NOTE — Progress Notes (Signed)
[facility]                                                                              Patient Demographics  Alice [name], is a 46 y.o. female, DOB - 1971-04-19, DGU:[id number]  Admit date - 12/28/2016   Admitting Physician Vianne Bulls, MD  Outpatient Primary MD for the patient is [facility] [place], MD  Outpatient specialists:   LOS - 1  days   Medical records reviewed and are as summarized below:    Chief Complaint  Patient presents with  . Back Pain  . Chest Pain  . Shortness of Breath       Brief summary   Patient is a 46 year old female with history of DVT, PE on warfarin, chronic back pain, hypertension, OSA on CPAP intolerance, presented to ED with chest pain, shortness of breath. Patient reported the pain as sharp, severe, constant on both side of the chest with dyspnea. Chest x-ray showed no pneumonia. VQ scan negative for PE. Troponin +0.05. Patient was admitted to stepdown unit. UA positive for UTI.   Assessment & Plan    Principal Problem:   Sepsis ([place])  -Patient met sepsis criteria for 103, tachycardia 104, hypotension 97/51 UA positive for UTI, also being treated for venous ulcer on the right lower extremity  - Obtain blood cultures, urine cultures, lactic acid, pro-calcitonin - Requested Dr. Sharol Given to evaluate the venous ulcer on right lower extremity (patient refuses wound care or myself to assess the RLE wound) - Placed on IV Rocephin, IV fluid hydration for hypotension  Active Problems: Acute UTI ([place]) - Obtain urine culture and send studies, blood cultures - Placed on IV Rocephin  Atypical chest pain - Unclear in etiology, patient reports constant chest pain with shortness of breath -  Given history of DVT, PE, VQ scan was obtained which was negative for pulmonary embolism - Troponin elevated 0.05, cardiology consult obtained    Acute kidney injury superimposed on CKD, stage III (HCC) - Serum creatinine 2.5 on admission,  baseline 1.1 - Possible prerenal, dehydration and recent vomiting -Continue IV fluid hydration, repeat BMET pending      hypotension  - Likely due to #1 sepsis, has a underlying history of hypertension - Hold HCTZ, amlodipine - Continue IV fluid hydration    History of pulmonary embolism. History of DVT (deep vein thrombosis) - Continue Coumadin per pharmacy - Repeat VQ scan showed no pulmonary embolism    OSA (obstructive sleep apnea) - Continue CPAP  Hypokalemia - Repeat BMET pending    Microcytic anemia - Fe 7, saturation ratio 2, suggesting microcytic anemia however patient has B12 deficiency as well. 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 Sharol Given to evaluate, requested consult  Code Status: full  DVT Prophylaxis:  warfarin Family Communication: Discussed in detail with the patient, all imaging results, lab results explained to the patient   Disposition Plan: in SDU  Time Spent in minutes   25 minutes  Procedures:    Consultants:   Cardiology   Dr. Sharol Given  Antimicrobials:  IV Rocephin 8/16   Medications  Scheduled Meds: . aspirin  324 mg Oral NOW   Or  . aspirin  300 mg Rectal NOW  . aspirin EC  81 mg Oral Daily  . buPROPion  150 mg Oral Daily  . cholecalciferol  5,000 Units Oral Daily  . gabapentin  300 mg Oral QHS  . pantoprazole (PROTONIX) IV  40 mg Intravenous Q12H  . potassium chloride SA  40 mEq Oral BID  . warfarin  12.5 mg Oral ONCE-1800  . Warfarin - Pharmacist Dosing Inpatient   Does not apply q1800   Continuous Infusions: . sodium chloride    . cefTRIAXone (ROCEPHIN)  IV     PRN Meds:.acetaminophen, albuterol, HYDROmorphone (DILAUDID) injection, nitroGLYCERIN, ondansetron (ZOFRAN) IV, oxyCODONE   Antibiotics   Anti-infectives    Start     Dose/Rate Route Frequency Ordered Stop   12/29/16 1400  cefTRIAXone (ROCEPHIN) 1 g in dextrose 5 % 50 mL IVPB     1  g 100 mL/hr over 30 Minutes Intravenous Every 24 hours 12/29/16 1315          Subjective:   Alice [name] was seen and examined today. Still complaining of chest pain 6/10 associated with shortness of breath. Has wound on the right lower extremity, refuses to let me examine.  Patient denies dizziness, abdominal pain, N/V/D/C, new weakness, numbess, tingling. No acute events overnight.    Objective:   Vitals:   12/29/16 0900 12/29/16 1000 12/29/16 1100 12/29/16 1219  BP: (!) 88/41 (!) 94/41 (!) 102/46   Pulse: (!) 103     Resp: (!) 22 (!) 24 (!) 0   Temp:    (!) 103 F (39.4 C)  TempSrc:    Oral  SpO2: 96% 99% 99%   Weight:      Height:        Intake/Output Summary (Last 24 hours) at 12/29/16 1322 Last data filed at 12/29/16 0800  Gross per 24 hour  Intake             2536 ml  Output              450 ml  Net             2086 ml     Wt Readings from Last 3 Encounters:  12/29/16 (!) 143.6 kg (316 lb 9.3 oz)  12/22/16 (!) 154.2 kg (340 lb)  12/19/16 (!) 154.2 kg (340 lb)     Exam  General: Alert and oriented x 3, NAD, uncomfortable   Eyes:   HEENT:  Atraumatic, normocephalic  Cardiovascular: S1 S2 auscultated, no rubs, murmurs or gallops. Regular rate and rhythm.  Respiratory: Clear to auscultation bilaterally, no wheezing, rales or rhonchi  Gastrointestinal: Soft, nontender, nondistended, + bowel sounds  Ext:  trace pedal edema left, dressing intact on the right lower extremity  Neuro : No neuro deficits   Musculoskeletal: No digital cyanosis, clubbing  Skin: No rashes  Psych: Normal affect and demeanor, alert and oriented x3    Data Reviewed:  I have personally reviewed following labs and imaging studies  Micro Results Recent Results (from the past 240 hour(s))  MRSA PCR Screening     Status: None   Collection Time: 12/29/16  5:32 AM  Result Value Ref Range Status   MRSA by PCR NEGATIVE NEGATIVE Final    Comment:        The GeneXpert MRSA Assay  (FDA approved for NASAL specimens only), is one component  of a comprehensive MRSA colonization surveillance program. It is not intended to diagnose MRSA infection nor to guide or monitor treatment for MRSA infections.     Radiology Reports US Renal  Result Date: 12/28/2016 CLINICAL DATA:  Acute kidney injury, pain in chills for 2 days EXAM: RENAL / URINARY TRACT ULTRASOUND COMPLETE COMPARISON:  05/02/2015, 05/17/2013 FINDINGS: Right Kidney: Length: 10.9 cm. Echogenicity within normal limits. No mass or hydronephrosis visualized. Left Kidney: Length: 11.7 cm. Echogenicity within normal limits. No mass or hydronephrosis visualized. Bladder: Appears normal for degree of bladder distention. IMPRESSION: Negative renal ultrasound Electronically Signed   By: Donavan Foil M.D.   On: 12/28/2016 22:05   Nm Pulmonary Vent And Perf (v/q Scan)  Result Date: 12/28/2016 CLINICAL DATA:  Chest pain and shortness of Breath EXAM: NUCLEAR MEDICINE VENTILATION - PERFUSION LUNG SCAN TECHNIQUE: Ventilation images were obtained in multiple projections using inhaled aerosol Tc-73mDTPA. Perfusion images were obtained in multiple projections after intravenous injection of Tc-920mAA. RADIOPHARMACEUTICALS:  31.6 mCi Technetium-9914mPA aerosol inhalation and 4.24 mCi Technetium-44m40m IV COMPARISON:  Chest x-ray from earlier in the same day. FINDINGS: Ventilation: No focal ventilation defect. Perfusion: Lateral imaging could not be performed due the patient's size and cold for although the remaining perfusion images show no significant perfusion defect. IMPRESSION: No evidence of pulmonary embolism. Electronically Signed   By: MarkInez Catalina.   On: 12/28/2016 19:40   Dg Chest Portable 1 View  Result Date: 12/28/2016 CLINICAL DATA:  Shortness of breath and chest pain EXAM: PORTABLE CHEST 1 VIEW COMPARISON:  April 19, 2015 FINDINGS: Lungs are clear. Heart is borderline enlarged with pulmonary vascularity within  normal limits. No adenopathy. No pneumothorax. No bone lesions. IMPRESSION: Borderline cardiac enlargement.  No edema or consolidation. Electronically Signed   By: WillLowella Grip M.D.   On: 12/28/2016 17:09    Lab Data:  CBC:  Recent Labs Lab 12/28/16 1704 12/29/16 0210  WBC 12.3* 12.8*  HGB 8.4* 8.1*  HCT 25.9* 24.8*  MCV 67.6* 67.0*  PLT 252 224 837asic Metabolic Panel:  Recent Labs Lab 12/28/16 1704  NA 137  K 2.5*  CL 107  CO2 20*  GLUCOSE 115*  BUN 35*  CREATININE 2.55*  CALCIUM 8.4*  MG 1.9   GFR: Estimated Creatinine Clearance: 42.6 mL/min (A) (by C-G formula based on SCr of 2.55 mg/dL (H)). Liver Function Tests: No results for input(s): AST, ALT, ALKPHOS, BILITOT, PROT, ALBUMIN in the last 168 hours. No results for input(s): LIPASE, AMYLASE in the last 168 hours. No results for input(s): AMMONIA in the last 168 hours. Coagulation Profile:  Recent Labs Lab 12/28/16 1704 12/29/16 0210  INR 2.96 2.97   Cardiac Enzymes:  Recent Labs Lab 12/28/16 2240 12/29/16 0210  TROPONINI 0.05* 0.05*   BNP (last 3 results) No results for input(s): PROBNP in the last 8760 hours. HbA1C: No results for input(s): HGBA1C in the last 72 hours. CBG:  Recent Labs Lab 12/29/16 0531  GLUCAP 119*   Lipid Profile: No results for input(s): CHOL, HDL, LDLCALC, TRIG, CHOLHDL, LDLDIRECT in the last 72 hours. Thyroid Function Tests: No results for input(s): TSH, T4TOTAL, FREET4, T3FREE, THYROIDAB in the last 72 hours. Anemia Panel:  Recent Labs  12/28/16 2240  VITAMINB12 173*  FOLATE 7.7  FERRITIN 61  TIBC 330  IRON 7*  RETICCTPCT 0.8   Urine analysis:    Component Value Date/Time   COLORURINE YELLOW 12/28/2016 2200   APPEARANCEUR CLOUDY (  A) 12/28/2016 2200   LABSPEC 1.012 12/28/2016 2200   PHURINE 5.0 12/28/2016 2200   GLUCOSEU NEGATIVE 12/28/2016 2200   HGBUR LARGE (A) 12/28/2016 2200   BILIRUBINUR NEGATIVE 12/28/2016 2200   KETONESUR NEGATIVE  12/28/2016 2200   PROTEINUR 100 (A) 12/28/2016 2200   UROBILINOGEN 4.0 (H) 11/25/2013 1511   NITRITE POSITIVE (A) 12/28/2016 2200   LEUKOCYTESUR LARGE (A) 12/28/2016 2200     Lucina Betty M.D. [facility] 12/29/2016, 1:22 PM  Pager: [phone] Between 7am to 7pm - call Pager - [phone]  After 7pm go to [email or web address] - password [email or web address]  Call night coverage person covering after 7pm

## 2016-12-29 NOTE — Progress Notes (Signed)
ANTICOAGULATION CONSULT NOTE - Initial Consult  Pharmacy Consult for warfarin Indication: pulmonary embolus  Allergies  Allergen Reactions  . Oxycodone Hcl Nausea Only  . Vicodin [Hydrocodone-Acetaminophen] Nausea And Vomiting    Patient Measurements: Height: 5\' 8"  (172.7 cm) Weight: (!) 316 lb 9.3 oz (143.6 kg) IBW/kg (Calculated) : 63.9   Vital Signs: Temp: 100.3 F (37.9 C) (08/16 0852) Temp Source: Oral (08/16 0852) BP: 102/46 (08/16 1100) Pulse Rate: 103 (08/16 0900)  Labs:  Recent Labs  12/28/16 1704 12/28/16 2240 12/29/16 0210  HGB 8.4*  --  8.1*  HCT 25.9*  --  24.8*  PLT 252  --  224  LABPROT 31.4*  --  31.6*  INR 2.96  --  2.97  CREATININE 2.55*  --   --   TROPONINI  --  0.05* 0.05*    Estimated Creatinine Clearance: 42.6 mL/min (A) (by C-G formula based on SCr of 2.55 mg/dL (H)).   Medical History: Past Medical History:  Diagnosis Date  . Anemia   . Anxiety   . Chronic bronchitis ([place])   . Chronic upper back pain   . Depression   . DVT (deep venous thrombosis) (HCC)    BLE  . GERD (gastroesophageal reflux disease)   . Headache    "weekly" (04/16/2015)  . Hypertension   . Migraine    "monthly" (04/16/2015)  . Obesity   . Pneumonia 2014  . Pulmonary embolism ([place])   . Sinusitis nasal   . Sleep apnea    "I'm suppose to wear a mask but I don't" (04/16/2015)  . Varicose veins of right lower extremity   . Venous stasis of lower extremity    right    Medications:  Scheduled:  . aspirin  324 mg Oral NOW   Or  . aspirin  300 mg Rectal NOW  . aspirin EC  81 mg Oral Daily  . buPROPion  150 mg Oral Daily  . cholecalciferol  5,000 Units Oral Daily  . gabapentin  300 mg Oral QHS  . potassium chloride SA  40 mEq Oral BID  . Warfarin - Pharmacist Dosing Inpatient   Does not apply q1800    Assessment: 46 y.o. female  presenting with dyspnea and chest pain, taking warfarin PTA for history of PE.  PTA dose verified yesterday by pharmacy as  12.5mg  daily.  The INR this morning is 2.97 and is therapeutic. No signs or symptoms of bleeding noted per the nurse. CBC is stable.   Goal of Therapy:  INR 2-3 Monitor platelets by anticoagulation protocol: Yes   Plan:  Warfarin 12.5mg  x 1 Monitor INR and CBC daily, s/s bleeding   Jalene Mullet, Pharm.D. PGY1 Pharmacy Resident 12/29/2016 11:14 AM Main Pharmacy: [phone]

## 2016-12-30 DIAGNOSIS — R748 Abnormal levels of other serum enzymes: Secondary | ICD-10-CM

## 2016-12-30 DIAGNOSIS — A412 Sepsis due to unspecified staphylococcus: Secondary | ICD-10-CM

## 2016-12-30 DIAGNOSIS — D649 Anemia, unspecified: Secondary | ICD-10-CM

## 2016-12-30 LAB — PROTIME-INR
INR: 4.33 — AB
PROTHROMBIN TIME: 42.7 s — AB (ref 11.4–15.2)

## 2016-12-30 LAB — BASIC METABOLIC PANEL
Anion gap: 9 (ref 5–15)
BUN: 42 mg/dL — ABNORMAL HIGH (ref 6–20)
CALCIUM: 7.7 mg/dL — AB (ref 8.9–10.3)
CO2: 18 mmol/L — ABNORMAL LOW (ref 22–32)
Chloride: 107 mmol/L (ref 101–111)
Creatinine, Ser: 4.09 mg/dL — ABNORMAL HIGH (ref 0.44–1.00)
GFR calc non Af Amer: 12 mL/min — ABNORMAL LOW (ref >60)
GFR, EST AFRICAN AMERICAN: 14 mL/min — AB (ref >60)
Glucose, Bld: 110 mg/dL — ABNORMAL HIGH (ref 65–99)
Potassium: 3.2 mmol/L — ABNORMAL LOW (ref 3.5–5.1)
Sodium: 134 mmol/L — ABNORMAL LOW (ref 135–145)

## 2016-12-30 LAB — BLOOD CULTURE ID PANEL (REFLEXED)
Acinetobacter baumannii: NOT DETECTED
CANDIDA ALBICANS: NOT DETECTED
CANDIDA GLABRATA: NOT DETECTED
CANDIDA KRUSEI: NOT DETECTED
CANDIDA TROPICALIS: NOT DETECTED
Candida parapsilosis: NOT DETECTED
Carbapenem resistance: NOT DETECTED
ENTEROBACTER CLOACAE COMPLEX: NOT DETECTED
ESCHERICHIA COLI: DETECTED — AB
Enterobacteriaceae species: DETECTED — AB
Enterococcus species: NOT DETECTED
Haemophilus influenzae: NOT DETECTED
KLEBSIELLA OXYTOCA: NOT DETECTED
Klebsiella pneumoniae: NOT DETECTED
LISTERIA MONOCYTOGENES: NOT DETECTED
Methicillin resistance: NOT DETECTED
NEISSERIA MENINGITIDIS: NOT DETECTED
PROTEUS SPECIES: NOT DETECTED
Pseudomonas aeruginosa: NOT DETECTED
SERRATIA MARCESCENS: NOT DETECTED
STREPTOCOCCUS PNEUMONIAE: NOT DETECTED
STREPTOCOCCUS SPECIES: NOT DETECTED
Staphylococcus aureus (BCID): NOT DETECTED
Staphylococcus species: NOT DETECTED
Streptococcus agalactiae: NOT DETECTED
Streptococcus pyogenes: NOT DETECTED
Vancomycin resistance: NOT DETECTED

## 2016-12-30 LAB — CBC
HCT: 25.7 % — ABNORMAL LOW (ref 36.0–46.0)
HEMOGLOBIN: 8.5 g/dL — AB (ref 12.0–15.0)
MCH: 22 pg — AB (ref 26.0–34.0)
MCHC: 33.1 g/dL (ref 30.0–36.0)
MCV: 66.6 fL — ABNORMAL LOW (ref 78.0–100.0)
Platelets: 211 10*3/uL (ref 150–400)
RBC: 3.86 MIL/uL — AB (ref 3.87–5.11)
RDW: 18.1 % — ABNORMAL HIGH (ref 11.5–15.5)
WBC: 17.3 10*3/uL — ABNORMAL HIGH (ref 4.0–10.5)

## 2016-12-30 MED ORDER — PANTOPRAZOLE SODIUM 40 MG PO TBEC: 40.0000 mg | DELAYED_RELEASE_TABLET | Freq: Two times a day (BID) | ORAL | Status: DC

## 2016-12-30 MED ORDER — COLCHICINE 0.6 MG PO TABS
0.6000 mg | ORAL_TABLET | Freq: Every day | ORAL | Status: DC
  Administered 2016-12-31 – 2017-01-02 (×3): 0.6 mg via ORAL
  Filled 2016-12-30 (×3): qty 1

## 2016-12-30 MED ORDER — DEXTROSE 5 % IV SOLN
2.0000 g | INTRAVENOUS | Status: DC
  Administered 2016-12-30 – 2017-01-02 (×4): 2 g via INTRAVENOUS
  Filled 2016-12-30 (×4): qty 2

## 2016-12-30 NOTE — Discharge Instructions (Signed)

## 2016-12-30 NOTE — Consult Note (Signed)
Reason for Consult: AKI Referring Physician:  Dr. Elige Ko Alice [name] is an 46 y.o. female.  HPI:  Pt is a 46yo AAF with PMH significant for HTN, DVT/PE on chronic anticoagulation, OSA (intolerant to CPAP), and morbid obesity who presented to [facility] on 12/28/16 with fevers, chills, urinary frequency, right leg pain, chest pain, sob, nausea and vomiting.  She was noted to have an elevated Scr upon admission and hypotensive with SBP's in the 70's.  She was started on IVF's, however her Scr continued to increase.  The trend in Scr is seen below.  She was also found to have enterobacter and E. Coli urosepsis and possibly pyelonephritis (on the right by [place]).  We were consulted to help further evaluate and manage her AKI.  Of note, she has been taking 1600mg  of ibuprofen every 4-6 hours at home for the last few weeks due to pain in her right leg.  She denies any previous history of CKD, however her Scr has been elevated in the past.   Trend in Creatinine: Creatinine, Ser  Date/Time Value Ref Range Status  12/30/2016 02:41 AM 4.09 (H) 0.44 - 1.00 mg/dL Final  12/29/2016 02:40 PM 3.81 (H) 0.44 - 1.00 mg/dL Final  12/28/2016 05:04 PM 2.55 (H) 0.44 - 1.00 mg/dL Final  10/01/2016 11:35 PM 1.15 (H) 0.44 - 1.00 mg/dL Final  07/25/2016 09:57 AM 1.07 (H) 0.44 - 1.00 mg/dL Final  07/13/2015 07:56 PM 1.06 (H) 0.44 - 1.00 mg/dL Final  05/03/2015 03:30 AM 1.03 (H) 0.44 - 1.00 mg/dL Final  05/01/2015 11:47 PM 1.24 (H) 0.44 - 1.00 mg/dL Final  04/20/2015 04:16 AM 1.23 (H) 0.44 - 1.00 mg/dL Final  04/19/2015 05:12 AM 1.33 (H) 0.44 - 1.00 mg/dL Final  04/18/2015 05:40 AM 1.02 (H) 0.44 - 1.00 mg/dL Final  04/17/2015 07:06 AM 1.13 (H) 0.44 - 1.00 mg/dL Final  04/16/2015 05:20 AM 1.28 (H) 0.44 - 1.00 mg/dL Final  03/01/2015 10:51 AM 1.16 (H) 0.44 - 1.00 mg/dL Final  05/20/2013 07:31 AM 1.08 0.50 - 1.10 mg/dL Final  05/19/2013 07:15 AM 1.18 (H) 0.50 - 1.10 mg/dL Final  05/18/2013 08:03 AM 1.46 (H) 0.50 - 1.10 mg/dL Final   05/17/2013 03:35 AM 2.99 (H) 0.50 - 1.10 mg/dL Final  05/16/2013 06:00 PM 3.64 (H) 0.50 - 1.10 mg/dL Final  01/12/2013 05:27 AM 1.20 (H) 0.50 - 1.10 mg/dL Final  07/14/2010 10:53 AM 1.08 0.4 - 1.2 mg/dL Final  03/05/2010 09:38 PM 0.95 0.40 - 1.20 mg/dL Final  02/02/2010 03:08 AM 1.20 0.4 - 1.2 mg/dL Final  07/23/2009 10:24 PM 1.14 0.40 - 1.20 mg/dL Final  06/10/2009 09:56 PM 0.94 0.40 - 1.20 mg/dL Final  09/08/2008 10:16 AM 1.00 0.4 - 1.2 mg/dL Final  11/08/2007 05:45 AM 1.00  Final  11/07/2007 01:50 PM 0.86  Final  11/06/2007 03:43 AM 0.84  Final  11/05/2007 05:35 PM 0.87  Final  11/05/2007 12:23 PM 1.0  Final  10/02/2007 06:02 AM 0.96  Final  10/01/2007 05:25 AM 0.95  Final  09/30/2007 07:56 AM 1.1  Final  09/30/2007 07:49 AM 1.00  Final  09/14/2007 10:19 PM 1.20 0.40 - 1.20 mg/dL Final    PMH:   Past Medical History:  Diagnosis Date  . Anemia   . Anxiety   . Chronic bronchitis ([place])   . Chronic upper back pain   . Depression   . DVT (deep venous thrombosis) ([facility])    BLE  . GERD (gastroesophageal reflux disease)   . Headache    "  weekly" (04/16/2015)  . Hypertension   . Migraine    "monthly" (04/16/2015)  . Obesity   . Pneumonia 2014  . Pulmonary embolism ([place])   . Sinusitis nasal   . Sleep apnea    "I'm suppose to wear a mask but I don't" (04/16/2015)  . Varicose veins of right lower extremity   . Venous stasis of lower extremity    right    PSH:   Past Surgical History:  Procedure Laterality Date  . CHOLECYSTECTOMY N/A 04/18/2015   Procedure: LAPAROSCOPIC CHOLECYSTECTOMY;  Surgeon: Ralene Ok, MD;  Location: [place];  Service: General;  Laterality: N/A;  . HYSTEROSCOPY W/D&C  12/24/2001   Archie Endo 09/28/2010  . I&D EXTREMITY Right 07/25/2016   Procedure: IRRIGATION AND DEBRIDEMENT RIGHT LEG ULCER, APPLY VERAFLO VAC;  Surgeon: [name], MD;  Location: [place];  Service: Orthopedics;  Laterality: Right;  . INCISE AND DRAIN ABCESS Right 07/14/2016  . INCISION  AND DRAINAGE Right 09/10/2008   leg:  skin and soft tissue and muscle/notes 09/15/2010  . INCISION AND DRAINAGE Right 01/01/2008   Chronic venous stasis insufficiency ulcer,/notes 09/14/2010/  . INCISION AND DRAINAGE Right 0/0938   calf w/application wound vac/notes 07/20/2010  . INCISION AND DRAINAGE Right 07/25/2016   IRRIGATION AND DEBRIDEMENT RIGHT LEG ULCER,  . LAPAROSCOPIC GASTRIC BYPASS  ~ 2007  . SKIN GRAFT SPLIT THICKNESS LEG / FOOT Right 07/25/2016   LEG  . SKIN SPLIT GRAFT Right 07/27/2016   Procedure: SKIN GRAFT RIGHT LEG WITH THERASKIN APPLICATION;  Surgeon: [name], MD;  Location: [place];  Service: Orthopedics;  Laterality: Right;  . TONSILLECTOMY      Allergies:  Allergies  Allergen Reactions  . Oxycodone Hcl Nausea Only  . Vicodin [Hydrocodone-Acetaminophen] Nausea And Vomiting    Medications:   Prior to Admission medications   Medication Sig Start Date End Date Taking? Authorizing Provider  albuterol (PROVENTIL HFA;VENTOLIN HFA) 108 (90 Base) MCG/ACT inhaler Inhale 2-3 puffs into the lungs every 6 (six) hours as needed for wheezing or shortness of breath.    Yes [provider]  amLODipine (NORVASC) 10 MG tablet Take 1 tablet (10 mg total) by mouth daily. 05/20/13  Yes [name], MD  buPROPion (WELLBUTRIN XL) 150 MG 24 hr tablet Take 150 mg by mouth daily.   Yes [provider]  hydrochlorothiazide (HYDRODIURIL) 25 MG tablet Take 25 mg by mouth daily.   Yes [provider]  ibuprofen (ADVIL,MOTRIN) 200 MG tablet Take 200-600 mg by mouth every 6 (six) hours as needed (for pain or headaches).    Yes [provider]  levonorgestrel (MIRENA) 20 MCG/24HR IUD 1 each by Intrauterine route once.    Yes [provider]  warfarin (COUMADIN) 2.5 MG tablet Take 5 tablets (12.5 mg total) by mouth one time only at 6 PM. Take daily until INR checked and then per PCP. Patient taking differently: Take 12.5 mg by mouth at bedtime.   05/20/13  Yes [name], MD    Inpatient medications: . aspirin EC  81 mg Oral Daily  . buPROPion  150 mg Oral Daily  . cholecalciferol  5,000 Units Oral Daily  . [START ON 12/31/2016] colchicine  0.6 mg Oral Daily  . gabapentin  300 mg Oral QHS  . potassium chloride SA  40 mEq Oral BID  . Warfarin - Pharmacist Dosing Inpatient   Does not apply q1800    Discontinued Meds:   Medications Discontinued During This Encounter  Medication Reason  . Potassium Chloride ER 20 MEQ TBCR Duplicate  . amoxicillin (AMOXIL) 500 MG capsule Completed Course  . Cholecalciferol (VITAMIN D3) 5000 units CAPS Patient Preference  . clindamycin (CLEOCIN) 300 MG capsule Completed Course  . phentermine 37.5 MG capsule Patient has not taken in last 30 days  . gabapentin (NEURONTIN) 300 MG capsule Patient Preference  . phenazopyridine (PYRIDIUM) 200 MG tablet Patient Preference  . sulindac (CLINORIL) 200 MG tablet Completed Course  . potassium chloride SA (K-DUR,KLOR-CON) 20 MEQ tablet Not available  . oxyCODONE (ROXICODONE) 5 MG immediate release tablet Patient Preference  . sodium chloride 0.9 % bolus 1,000 mL   . cefTRIAXone (ROCEPHIN) 1 g in dextrose 5 % 50 mL IVPB Dose change  . pantoprazole (PROTONIX) injection 40 mg   . colchicine tablet 0.6 mg   . pantoprazole (PROTONIX) EC tablet 40 mg     Social History:  reports that she has never smoked. She has never used smokeless tobacco. She reports that she does not drink alcohol or use drugs.  Family History:   Family History  Problem Relation Age of Onset  . Kidney disease Mother        kidney transplant    Pertinent items are noted in HPI. Weight change: -8.123 kg (-17 lb 14.5 oz)  Intake/Output Summary (Last 24 hours) at 12/30/16 1344 Last data filed at 12/30/16 0900  Gross per 24 hour  Intake          2488.33 ml  Output              800 ml  Net          1688.33 ml   BP (!) 91/42   Pulse (!) 110   Temp 100 F (37.8 C) (Oral)    Resp (!) 25   Ht 5\' 8"  (1.727 m)   Wt (!) 146.1 kg (322 lb 1.5 oz)   SpO2 92%   BMI 48.97 kg/m  Vitals:   12/30/16 0100 12/30/16 0401 12/30/16 0727 12/30/16 0915  BP: (!) 114/48 (!) 125/48 (!) 91/42   Pulse:  (!) 112 (!) 110   Resp: (!) 29 (!) 24 (!) 25   Temp:  98.1 F (36.7 C) (!) 103.1 F (39.5 C) 100 F (37.8 C)  TempSrc:  Oral Oral Oral  SpO2: 95% 94% 92%   Weight:  (!) 146.1 kg (322 lb 1.5 oz)    Height:  5\' 8"  (1.727 m)       General appearance: mild distress and occassional rigors Head: Normocephalic, without obvious abnormality, atraumatic Resp: clear to auscultation bilaterally Cardio: tachycardic, no rub GI: soft, non-tender; bowel sounds normal; no masses,  no organomegaly Extremities: extremities normal, atraumatic, no cyanosis or edema and wound on right lower leg under wrappings  Labs: Basic Metabolic Panel:  Recent Labs Lab 12/28/16 1704 12/29/16 1440 12/30/16 0241  NA 137 137 134*  K 2.5* 3.1* 3.2*  CL 107 108 107  CO2 20* 21* 18*  GLUCOSE 115* 117* 110*  BUN 35* 38* 42*  CREATININE 2.55* 3.81* 4.09*  CALCIUM 8.4* 7.8* 7.7*   Liver Function Tests: No results for input(s): AST, ALT, ALKPHOS, BILITOT, PROT, ALBUMIN in the last 168 hours. No results for input(s): LIPASE, AMYLASE in the last 168 hours. No results for input(s): AMMONIA in the last 168 hours. CBC:  Recent Labs Lab 12/28/16 1704 12/29/16 0210 12/29/16 1440 12/30/16 0241  WBC 12.3* 12.8* 15.2* 17.3*  HGB 8.4* 8.1* 7.8* 8.5*  HCT  25.9* 24.8* 24.0* 25.7*  MCV 67.6* 67.0* 67.0* 66.6*  PLT 252 224 208 211   PT/INR: @LABRCNTIP (inr:5) Cardiac Enzymes: ) Recent Labs Lab 12/28/16 2240 12/29/16 0210 12/29/16 1440  TROPONINI 0.05* 0.05* 0.05*   CBG:  Recent Labs Lab 12/29/16 0531  GLUCAP 119*    Iron Studies:  Recent Labs Lab 12/28/16 2240  IRON 7*  TIBC 330  FERRITIN 61    Xrays/Other Studies: US Renal  Result Date: 12/28/2016 CLINICAL DATA:  Acute kidney  injury, pain in chills for 2 days EXAM: RENAL / URINARY TRACT ULTRASOUND COMPLETE COMPARISON:  05/02/2015, 05/17/2013 FINDINGS: Right Kidney: Length: 10.9 cm. Echogenicity within normal limits. No mass or hydronephrosis visualized. Left Kidney: Length: 11.7 cm. Echogenicity within normal limits. No mass or hydronephrosis visualized. Bladder: Appears normal for degree of bladder distention. IMPRESSION: Negative renal ultrasound Electronically Signed   By: Donavan Foil M.D.   On: 12/28/2016 22:05   Nm Pulmonary Vent And Perf (v/q Scan)  Result Date: 12/28/2016 CLINICAL DATA:  Chest pain and shortness of Breath EXAM: NUCLEAR MEDICINE VENTILATION - PERFUSION LUNG SCAN TECHNIQUE: Ventilation images were obtained in multiple projections using inhaled aerosol Tc-68m DTPA. Perfusion images were obtained in multiple projections after intravenous injection of Tc-35m MAA. RADIOPHARMACEUTICALS:  31.6 mCi Technetium-32m DTPA aerosol inhalation and 4.24 mCi Technetium-68m MAA IV COMPARISON:  Chest x-ray from earlier in the same day. FINDINGS: Ventilation: No focal ventilation defect. Perfusion: Lateral imaging could not be performed due the patient's size and cold for although the remaining perfusion images show no significant perfusion defect. IMPRESSION: No evidence of pulmonary embolism. Electronically Signed   By: Inez Catalina M.D.   On: 12/28/2016 19:40   Dg Chest Portable 1 View  Result Date: 12/28/2016 CLINICAL DATA:  Shortness of breath and chest pain EXAM: PORTABLE CHEST 1 VIEW COMPARISON:  April 19, 2015 FINDINGS: Lungs are clear. Heart is borderline enlarged with pulmonary vascularity within normal limits. No adenopathy. No pneumothorax. No bone lesions. IMPRESSION: Borderline cardiac enlargement.  No edema or consolidation. Electronically Signed   By: Lowella Grip III M.D.   On: 12/28/2016 17:09     Assessment/Plan: 1.  AKI/CKD- in setting of urosepsis with hypotension, hypovolemia, and NSAIDs.   Currently non-oliguric.  Continue with IVF's and antibiotics.  No indication for dialysis at this time.  2. Enterobacter/E. Coli urosepsis- on Rocephin IV, continue with IVF's.  BP's remain low. 3. Atypical chest pain, negative VQ scan 4. OSA- CPAP per primary 5. Morbid obesity 6. Iron deficient anemia- will need to r/o GIB given her NSAID use and coumadin.  Transfuse prn.  Will give IV Iron once she is more hemodynamically stable.    7. Venous stasis ulcer of right leg 8. Hypokalemia  9. Elevated troponin and new cardiomyopathy- EF 45-50% with diffuse hypokinesis.  Cardiology following.    Governor Rooks Lisaanne Lawrie 12/30/2016, 1:44 PM

## 2016-12-30 NOTE — Progress Notes (Signed)
[facility]                                                                              Patient Demographics  Alice [name], is a 46 y.o. female, DOB - 04/19/1971, ZGY:[id number]  Admit date - 12/28/2016   Admitting Physician Vianne Bulls, MD  Outpatient Primary MD for the patient is No primary care provider on file.  Outpatient specialists:   LOS - 2  days   Medical records reviewed and are as summarized below:    Chief Complaint  Patient presents with  . Back Pain  . Chest Pain  . Shortness of Breath       Brief summary   Patient is a 46 year old female with history of DVT, PE on warfarin, chronic back pain, hypertension, OSA on CPAP intolerance, presented to ED with chest pain, shortness of breath. Patient reported the pain as sharp, severe, constant on both side of the chest with dyspnea. Chest x-ray showed no pneumonia. VQ scan negative for PE. Troponin +0.05. Patient was admitted to stepdown unit. UA positive for UTI.   Assessment & Plan    Principal Problem:   Sepsis ([place]) - Enterobacter, Escherichia coli bacteremia -Patient met sepsis criteria for 103, tachycardia 104, hypotension 97/51 UA positive for UTI, also being treated for venous ulcer on the right lower extremity  - Continue to follow sensitivities, urine culture also gram-negative rods, pro-calcitonin and elevated 42.8 - Appreciate Dr. Sharol Given for evaluating the venous ulcer on right lower extremity-> clean - Placed on 2 g IV Rocephin, continue IV fluids - Repeat blood cultures today -Has right CVAT, likely has right-sided pyelonephritis clinically. Renal ultrasound was negative for any obstruction or hydronephrosis, pyelonephritis   Active Problems: Acute UTI (HCC) -Urine cultures positive for gram-negative rods - Continue IV Rocephin  Atypical chest pain, new cardiomyopathy, elevated troponin - Unclear in etiology, patient reports constant chest pain with shortness of breath, worsens  with inspiration or positional changes -  Given history of DVT, PE, VQ scan was obtained which was negative for pulmonary embolism - Cardiology following, per Dr. Meda Coffee likely has presumed pericarditis, recommended start colchicine, plan on stress test or coronary CTA once recovered from acute illness     Acute kidney injury superimposed on CKD, stage III (HCC) - Serum creatinine 2.5 on admission, baseline 1.1 - Possible prerenal, dehydration and recent vomiting, severe sepsis medications HCTZ prior to admission - Creatinine continues to worsen 4.09 today, elevated pro calcitonin 42.8 - Nephrology consult obtained, discussed with Dr. Arty Baumgartner     hypotension  - Likely due to #1 sepsis, has a underlying history of hypertension - Hold HCTZ, amlodipine - Continue IV fluid hydration    History of pulmonary embolism. History of DVT (deep vein thrombosis) - Continue Coumadin per pharmacy, INR supratherapeutic - Repeat VQ scan showed no pulmonary embolism    OSA (obstructive sleep apnea) - Continue CPAP  Hypokalemia -Continue scheduled potassium replacement    Microcytic anemia - Fe 7, saturation ratio 2, suggesting microcytic anemia however patient has B12 deficiency as well. B12 level 173 - hemoglobin 8.5    Venous ulcer of right  lower extremity with varicose veins ([place]) - Dr. Sharol Given following   Code Status: full  DVT Prophylaxis:  warfarin Family Communication: Discussed in detail with the patient, all imaging results, lab results explained to the patient   Disposition Plan: in SDU  Time Spent in minutes   25 minutes  Procedures:    Consultants:   Cardiology   Dr. Sharol Given  Antimicrobials:   IV Rocephin 8/16   Medications  Scheduled Meds: . aspirin EC  81 mg Oral Daily  . buPROPion  150 mg Oral Daily  . cholecalciferol  5,000 Units Oral Daily  . gabapentin  300 mg Oral QHS  . potassium chloride SA  40 mEq Oral BID  . Warfarin - Pharmacist Dosing Inpatient   Does  not apply q1800   Continuous Infusions: . sodium chloride 125 mL/hr at 12/29/16 1420  . cefTRIAXone (ROCEPHIN)  IV Stopped (12/30/16 0838)   PRN Meds:.acetaminophen, albuterol, HYDROmorphone (DILAUDID) injection, nitroGLYCERIN, ondansetron (ZOFRAN) IV, oxyCODONE   Antibiotics   Anti-infectives    Start     Dose/Rate Route Frequency Ordered Stop   12/30/16 1000  cefTRIAXone (ROCEPHIN) 2 g in dextrose 5 % 50 mL IVPB     2 g 100 mL/hr over 30 Minutes Intravenous Every 24 hours 12/30/16 0645     12/29/16 1400  cefTRIAXone (ROCEPHIN) 1 g in dextrose 5 % 50 mL IVPB  Status:  Discontinued     1 g 100 mL/hr over 30 Minutes Intravenous Every 24 hours 12/29/16 1315 12/30/16 0645        Subjective:   Alice [name] was seen and examined today.  still spiking fever. Although feeling better from yesterday.  has right-sided back pain. No chest pain shortness of breath, nausea or vomiting.   Objective:   Vitals:   12/30/16 0100 12/30/16 0401 12/30/16 0727 12/30/16 0915  BP: (!) 114/48 (!) 125/48 (!) 91/42   Pulse:  (!) 112 (!) 110   Resp: (!) 29 (!) 24 (!) 25   Temp:  98.1 F (36.7 C) (!) 103.1 F (39.5 C) 100 F (37.8 C)  TempSrc:  Oral Oral Oral  SpO2: 95% 94% 92%   Weight:  (!) 146.1 kg (322 lb 1.5 oz)    Height:  5' 8"  (1.727 m)      Intake/Output Summary (Last 24 hours) at 12/30/16 1303 Last data filed at 12/30/16 0900  Gross per 24 hour  Intake          2488.33 ml  Output              800 ml  Net          1688.33 ml     Wt Readings from Last 3 Encounters:  12/30/16 (!) 146.1 kg (322 lb 1.5 oz)  12/22/16 (!) 154.2 kg (340 lb)  12/19/16 (!) 154.2 kg (340 lb)     Exam General: Alert and oriented x 3, NAD Eyes: HEENT:  Cardiovascular: S1 S2 auscultated, no rubs, murmurs or gallops. Regular rate and rhythm. No pedal edema b/l Respiratory: Clear to auscultation bilaterally, no wheezing, rales or rhonchi Gastrointestinal: Soft, nontender, nondistended, + bowel sounds,  rt CVAT + Ext: RLE  dressing intact Neuro: no new deficits Musculoskeletal: No digital cyanosis, clubbing Skin: RLE dressing  Psych: Normal affect and demeanor, alert and oriented x3      Data Reviewed:  I have personally reviewed following labs and imaging studies  Micro Results Recent Results (from the past 240 hour(s))  MRSA PCR  Screening     Status: None   Collection Time: 12/29/16  5:32 AM  Result Value Ref Range Status   MRSA by PCR NEGATIVE NEGATIVE Final    Comment:        The GeneXpert MRSA Assay (FDA approved for NASAL specimens only), is one component of a comprehensive MRSA colonization surveillance program. It is not intended to diagnose MRSA infection nor to guide or monitor treatment for MRSA infections.   Culture, blood (routine x 2)     Status: None (Preliminary result)   Collection Time: 12/29/16  2:40 PM  Result Value Ref Range Status   Specimen Description BLOOD LEFT HAND  Final   Special Requests IN PEDIATRIC BOTTLE Blood Culture adequate volume  Final   Culture  Setup Time   Final    GRAM NEGATIVE RODS IN PEDIATRIC BOTTLE CRITICAL RESULT CALLED TO, READ BACK BY AND VERIFIED WITH: J.LEDFORD, PHARMD 12/30/16 0634 L.CHAMPION    Culture GRAM NEGATIVE RODS  Final   Report Status PENDING  Incomplete  Culture, blood (routine x 2)     Status: None (Preliminary result)   Collection Time: 12/29/16  2:40 PM  Result Value Ref Range Status   Specimen Description BLOOD LEFT ARM  Final   Special Requests   Final    BOTTLES DRAWN AEROBIC ONLY Blood Culture adequate volume   Culture  Setup Time   Final    GRAM NEGATIVE RODS AEROBIC BOTTLE ONLY CRITICAL VALUE NOTED.  VALUE IS CONSISTENT WITH PREVIOUSLY REPORTED AND CALLED VALUE.    Culture GRAM NEGATIVE RODS  Final   Report Status PENDING  Incomplete  Blood Culture ID Panel (Reflexed)     Status: Abnormal   Collection Time: 12/29/16  2:40 PM  Result Value Ref Range Status   Enterococcus species NOT DETECTED  NOT DETECTED Final   Vancomycin resistance NOT DETECTED NOT DETECTED Final   Listeria monocytogenes NOT DETECTED NOT DETECTED Final   Staphylococcus species NOT DETECTED NOT DETECTED Final   Staphylococcus aureus NOT DETECTED NOT DETECTED Final   Methicillin resistance NOT DETECTED NOT DETECTED Final   Streptococcus species NOT DETECTED NOT DETECTED Final   Streptococcus agalactiae NOT DETECTED NOT DETECTED Final   Streptococcus pneumoniae NOT DETECTED NOT DETECTED Final   Streptococcus pyogenes NOT DETECTED NOT DETECTED Final   Acinetobacter baumannii NOT DETECTED NOT DETECTED Final   Enterobacteriaceae species DETECTED (A) NOT DETECTED Final    Comment: CRITICAL RESULT CALLED TO, READ BACK BY AND VERIFIED WITH: J.LEDFORD, PHARMD 12/30/16 0634 L.CHAMPION    Enterobacter cloacae complex NOT DETECTED NOT DETECTED Final   Escherichia coli DETECTED (A) NOT DETECTED Final    Comment: CRITICAL RESULT CALLED TO, READ BACK BY AND VERIFIED WITH: J.LEDFORD, PHARMD 12/30/16 0634 L.CHAMPION    Klebsiella oxytoca NOT DETECTED NOT DETECTED Final   Klebsiella pneumoniae NOT DETECTED NOT DETECTED Final   Proteus species NOT DETECTED NOT DETECTED Final   Serratia marcescens NOT DETECTED NOT DETECTED Final   Carbapenem resistance NOT DETECTED NOT DETECTED Final   Haemophilus influenzae NOT DETECTED NOT DETECTED Final   Neisseria meningitidis NOT DETECTED NOT DETECTED Final   Pseudomonas aeruginosa NOT DETECTED NOT DETECTED Final   Candida albicans NOT DETECTED NOT DETECTED Final   Candida glabrata NOT DETECTED NOT DETECTED Final   Candida krusei NOT DETECTED NOT DETECTED Final   Candida parapsilosis NOT DETECTED NOT DETECTED Final   Candida tropicalis NOT DETECTED NOT DETECTED Final  Urine Culture     Status: Abnormal (Preliminary  result)   Collection Time: 12/29/16  3:23 PM  Result Value Ref Range Status   Specimen Description URINE, CLEAN CATCH  Final   Special Requests NONE  Final   Culture  >=100,000 COLONIES/mL GRAM NEGATIVE RODS (A)  Final   Report Status PENDING  Incomplete    Radiology Reports US Renal  Result Date: 12/28/2016 CLINICAL DATA:  Acute kidney injury, pain in chills for 2 days EXAM: RENAL / URINARY TRACT ULTRASOUND COMPLETE COMPARISON:  05/02/2015, 05/17/2013 FINDINGS: Right Kidney: Length: 10.9 cm. Echogenicity within normal limits. No mass or hydronephrosis visualized. Left Kidney: Length: 11.7 cm. Echogenicity within normal limits. No mass or hydronephrosis visualized. Bladder: Appears normal for degree of bladder distention. IMPRESSION: Negative renal ultrasound Electronically Signed   By: Donavan Foil M.D.   On: 12/28/2016 22:05   Nm Pulmonary Vent And Perf (v/q Scan)  Result Date: 12/28/2016 CLINICAL DATA:  Chest pain and shortness of Breath EXAM: NUCLEAR MEDICINE VENTILATION - PERFUSION LUNG SCAN TECHNIQUE: Ventilation images were obtained in multiple projections using inhaled aerosol Tc-42mDTPA. Perfusion images were obtained in multiple projections after intravenous injection of Tc-974mAA. RADIOPHARMACEUTICALS:  31.6 mCi Technetium-9926mPA aerosol inhalation and 4.24 mCi Technetium-58m31m IV COMPARISON:  Chest x-ray from earlier in the same day. FINDINGS: Ventilation: No focal ventilation defect. Perfusion: Lateral imaging could not be performed due the patient's size and cold for although the remaining perfusion images show no significant perfusion defect. IMPRESSION: No evidence of pulmonary embolism. Electronically Signed   By: MarkInez Catalina.   On: 12/28/2016 19:40   Dg Chest Portable 1 View  Result Date: 12/28/2016 CLINICAL DATA:  Shortness of breath and chest pain EXAM: PORTABLE CHEST 1 VIEW COMPARISON:  April 19, 2015 FINDINGS: Lungs are clear. Heart is borderline enlarged with pulmonary vascularity within normal limits. No adenopathy. No pneumothorax. No bone lesions. IMPRESSION: Borderline cardiac enlargement.  No edema or consolidation.  Electronically Signed   By: WillLowella Grip M.D.   On: 12/28/2016 17:09    Lab Data:  CBC:  Recent Labs Lab 12/28/16 1704 12/29/16 0210 12/29/16 1440 12/30/16 0241  WBC 12.3* 12.8* 15.2* 17.3*  HGB 8.4* 8.1* 7.8* 8.5*  HCT 25.9* 24.8* 24.0* 25.7*  MCV 67.6* 67.0* 67.0* 66.6*  PLT 252 224 208 211 062asic Metabolic Panel:  Recent Labs Lab 12/28/16 1704 12/29/16 1440 12/30/16 0241  NA 137 137 134*  K 2.5* 3.1* 3.2*  CL 107 108 107  CO2 20* 21* 18*  GLUCOSE 115* 117* 110*  BUN 35* 38* 42*  CREATININE 2.55* 3.81* 4.09*  CALCIUM 8.4* 7.8* 7.7*  MG 1.9 1.8  --    GFR: Estimated Creatinine Clearance: 26.8 mL/min (A) (by C-G formula based on SCr of 4.09 mg/dL (H)). Liver Function Tests: No results for input(s): AST, ALT, ALKPHOS, BILITOT, PROT, ALBUMIN in the last 168 hours. No results for input(s): LIPASE, AMYLASE in the last 168 hours. No results for input(s): AMMONIA in the last 168 hours. Coagulation Profile:  Recent Labs Lab 12/28/16 1704 12/29/16 0210 12/29/16 1440 12/30/16 0241  INR 2.96 2.97 3.23 4.33*   Cardiac Enzymes:  Recent Labs Lab 12/28/16 2240 12/29/16 0210 12/29/16 1440  TROPONINI 0.05* 0.05* 0.05*   BNP (last 3 results) No results for input(s): PROBNP in the last 8760 hours. HbA1C: No results for input(s): HGBA1C in the last 72 hours. CBG:  Recent Labs Lab 12/29/16 0531  GLUCAP 119*   Lipid Profile: No results for input(s): CHOL, HDL,  LDLCALC, TRIG, CHOLHDL, LDLDIRECT in the last 72 hours. Thyroid Function Tests: No results for input(s): TSH, T4TOTAL, FREET4, T3FREE, THYROIDAB in the last 72 hours. Anemia Panel:  Recent Labs  12/28/16 2240  VITAMINB12 173*  FOLATE 7.7  FERRITIN 61  TIBC 330  IRON 7*  RETICCTPCT 0.8   Urine analysis:    Component Value Date/Time   COLORURINE YELLOW 12/28/2016 2200   APPEARANCEUR CLOUDY (A) 12/28/2016 2200   LABSPEC 1.012 12/28/2016 2200   PHURINE 5.0 12/28/2016 2200    GLUCOSEU NEGATIVE 12/28/2016 2200   HGBUR LARGE (A) 12/28/2016 2200   BILIRUBINUR NEGATIVE 12/28/2016 2200   KETONESUR NEGATIVE 12/28/2016 2200   PROTEINUR 100 (A) 12/28/2016 2200   UROBILINOGEN 4.0 (H) 11/25/2013 1511   NITRITE POSITIVE (A) 12/28/2016 2200   LEUKOCYTESUR LARGE (A) 12/28/2016 2200     Ripudeep Rai M.D. [facility] 12/30/2016, 1:03 PM  Pager: [phone] Between 7am to 7pm - call Pager - [phone]  After 7pm go to [email or web address] - password [email or web address]  Call night coverage person covering after 7pm

## 2016-12-30 NOTE — Progress Notes (Signed)
ANTICOAGULATION CONSULT NOTE   Pharmacy Consult:  Coumadin Indication:  History of PE  Allergies  Allergen Reactions  . Oxycodone Hcl Nausea Only  . Vicodin [Hydrocodone-Acetaminophen] Nausea And Vomiting    Patient Measurements: Height: 5\' 8"  (172.7 cm) Weight: (!) 322 lb 1.5 oz (146.1 kg) IBW/kg (Calculated) : 63.9  Vital Signs: Temp: 100 F (37.8 C) (08/17 0915) Temp Source: Oral (08/17 0915) BP: 91/42 (08/17 0727) Pulse Rate: 110 (08/17 0727)  Labs:  Recent Labs  12/28/16 1704 12/28/16 2240 12/29/16 0210 12/29/16 1440 12/30/16 0241  HGB 8.4*  --  8.1* 7.8* 8.5*  HCT 25.9*  --  24.8* 24.0* 25.7*  PLT 252  --  224 208 211  LABPROT 31.4*  --  31.6* 33.7* 42.7*  INR 2.96  --  2.97 3.23 4.33*  CREATININE 2.55*  --   --  3.81* 4.09*  TROPONINI  --  0.05* 0.05* 0.05*  --     Estimated Creatinine Clearance: 26.8 mL/min (A) (by C-G formula based on SCr of 4.09 mg/dL (H)).   Assessment: 46 y.o. female  presented with dyspnea and chest pain, taking Coumadin PTA for history of PE.  INR increased to supra-therapeutic level this AM.  No bleeding reported.  Home dose: 12.5mg  PO daily   Goal of Therapy:  INR 2-3    Plan:  Hold Coumadin today Daily PT / INR Consider additional KCL supplementation, iron supplementation once infection clears    Icker Swigert D. Mina Marble, PharmD, BCPS Pager:  [phone] 12/30/2016, 10:31 AM

## 2016-12-30 NOTE — Progress Notes (Signed)
Dr. Tana Coast updated on pt Temp 103.1 F , BP 91/42, HR 110, Blood Cx  + for E-coli, enterobactereciae.Will continue to monitor.

## 2016-12-30 NOTE — Progress Notes (Signed)
PHARMACY - PHYSICIAN COMMUNICATION CRITICAL VALUE ALERT - BLOOD CULTURE IDENTIFICATION (BCID)  Results for orders placed or performed during the hospital encounter of 12/28/16  Blood Culture ID Panel (Reflexed) (Collected: 12/29/2016  2:40 PM)  Result Value Ref Range   Enterococcus species NOT DETECTED NOT DETECTED   Vancomycin resistance NOT DETECTED NOT DETECTED   Listeria monocytogenes NOT DETECTED NOT DETECTED   Staphylococcus species NOT DETECTED NOT DETECTED   Staphylococcus aureus NOT DETECTED NOT DETECTED   Methicillin resistance NOT DETECTED NOT DETECTED   Streptococcus species NOT DETECTED NOT DETECTED   Streptococcus agalactiae NOT DETECTED NOT DETECTED   Streptococcus pneumoniae NOT DETECTED NOT DETECTED   Streptococcus pyogenes NOT DETECTED NOT DETECTED   Acinetobacter baumannii NOT DETECTED NOT DETECTED   Enterobacteriaceae species DETECTED (A) NOT DETECTED   Enterobacter cloacae complex NOT DETECTED NOT DETECTED   Escherichia coli DETECTED (A) NOT DETECTED   Klebsiella oxytoca NOT DETECTED NOT DETECTED   Klebsiella pneumoniae NOT DETECTED NOT DETECTED   Proteus species NOT DETECTED NOT DETECTED   Serratia marcescens NOT DETECTED NOT DETECTED   Carbapenem resistance NOT DETECTED NOT DETECTED   Haemophilus influenzae NOT DETECTED NOT DETECTED   Neisseria meningitidis NOT DETECTED NOT DETECTED   Pseudomonas aeruginosa NOT DETECTED NOT DETECTED   Candida albicans NOT DETECTED NOT DETECTED   Candida glabrata NOT DETECTED NOT DETECTED   Candida krusei NOT DETECTED NOT DETECTED   Candida parapsilosis NOT DETECTED NOT DETECTED   Candida tropicalis NOT DETECTED NOT DETECTED    Name of physician (or Provider) Contacted: X Blount ([facility])  Changes to prescribed antibiotics required: Increase ceftriaxone to 2g IV q24h  Narda Bonds 12/30/2016  6:52 AM

## 2016-12-31 DIAGNOSIS — I5021 Acute systolic (congestive) heart failure: Secondary | ICD-10-CM

## 2016-12-31 DIAGNOSIS — I129 Hypertensive chronic kidney disease with stage 1 through stage 4 chronic kidney disease, or unspecified chronic kidney disease: Secondary | ICD-10-CM

## 2016-12-31 DIAGNOSIS — A419 Sepsis, unspecified organism: Secondary | ICD-10-CM

## 2016-12-31 LAB — CBC
HCT: 24.4 % — ABNORMAL LOW (ref 36.0–46.0)
HEMOGLOBIN: 8 g/dL — AB (ref 12.0–15.0)
MCH: 21.6 pg — ABNORMAL LOW (ref 26.0–34.0)
MCHC: 32.8 g/dL (ref 30.0–36.0)
MCV: 65.9 fL — AB (ref 78.0–100.0)
Platelets: 227 10*3/uL (ref 150–400)
RBC: 3.7 MIL/uL — AB (ref 3.87–5.11)
RDW: 18.5 % — ABNORMAL HIGH (ref 11.5–15.5)
WBC: 12.4 10*3/uL — AB (ref 4.0–10.5)

## 2016-12-31 LAB — URINE CULTURE

## 2016-12-31 LAB — BASIC METABOLIC PANEL
Anion gap: 11 (ref 5–15)
BUN: 39 mg/dL — ABNORMAL HIGH (ref 6–20)
CALCIUM: 8 mg/dL — AB (ref 8.9–10.3)
CHLORIDE: 111 mmol/L (ref 101–111)
CO2: 15 mmol/L — ABNORMAL LOW (ref 22–32)
CREATININE: 3.57 mg/dL — AB (ref 0.44–1.00)
GFR calc non Af Amer: 14 mL/min — ABNORMAL LOW (ref >60)
GFR, EST AFRICAN AMERICAN: 17 mL/min — AB (ref >60)
Glucose, Bld: 85 mg/dL (ref 65–99)
Potassium: 3.6 mmol/L (ref 3.5–5.1)
SODIUM: 137 mmol/L (ref 135–145)

## 2016-12-31 LAB — PROTIME-INR
INR: 6.39
PROTHROMBIN TIME: 58.2 s — AB (ref 11.4–15.2)

## 2016-12-31 LAB — PROCALCITONIN: PROCALCITONIN: 21.09 ng/mL

## 2016-12-31 NOTE — Progress Notes (Signed)
ANTICOAGULATION CONSULT NOTE - Follow Up  Pharmacy Consult:  Coumadin Indication:  History of PE  Allergies  Allergen Reactions  . Oxycodone Hcl Nausea Only  . Vicodin [Hydrocodone-Acetaminophen] Nausea And Vomiting    Patient Measurements: Height: 5\' 8"  (172.7 cm) Weight: (!) 335 lb 4.8 oz (152.1 kg) IBW/kg (Calculated) : 63.9  Vital Signs: Temp: 98.7 F (37.1 C) (08/18 0404) Temp Source: Oral (08/18 0404) BP: 101/58 (08/18 0404) Pulse Rate: 95 (08/18 0404)  Labs:  Recent Labs  12/28/16 2240 12/29/16 0210 12/29/16 1440 12/30/16 0241 12/31/16 0530  HGB  --  8.1* 7.8* 8.5* 8.0*  HCT  --  24.8* 24.0* 25.7* 24.4*  PLT  --  224 208 211 227  LABPROT  --  31.6* 33.7* 42.7* 58.2*  INR  --  2.97 3.23 4.33* 6.39*  CREATININE  --   --  3.81* 4.09* 3.57*  TROPONINI 0.05* 0.05* 0.05*  --   --     Estimated Creatinine Clearance: 31.5 mL/min (A) (by C-G formula based on SCr of 3.57 mg/dL (H)).   Assessment: 46 y.o. female presented with dyspnea and chest pain, taking Coumadin PTA for history of PE. INR increased further to 6.39 this AM. CBC stable. No bleeding reported.  PTA warfarin dose: 12.5mg  PO daily Admit INR: 2.96  Goal of Therapy:  INR 2-3 Monitor platelets by anticoagulation protocol: Yes   Plan:  Hold Coumadin today Daily INR Monitor for s/sx bleeding   Novi Calia N. Gerarda Fraction, PharmD PGY1 Pharmacy Resident Pager: [phone]

## 2016-12-31 NOTE — Progress Notes (Signed)
Patient ID: Alice [name], female   DOB: 1970/08/05, 46 y.o.   MRN: [id number] S:Feels a little better today but still with significant DOE O:BP 123/72   Pulse 95   Temp (!) 102.4 F (39.1 C) (Oral)   Resp (!) 21   Ht 5\' 8"  (1.727 m)   Wt (!) 152.1 kg (335 lb 4.8 oz)   SpO2 92%   BMI 50.98 kg/m   Intake/Output Summary (Last 24 hours) at 12/31/16 1244 Last data filed at 12/31/16 1124  Gross per 24 hour  Intake             3715 ml  Output             1400 ml  Net             2315 ml   Intake/Output: I/O last 3 completed shifts: In: 5188 [P.O.:1320; I.V.:4625] Out: 1700 [Urine:1700]  Intake/Output this shift:  Total I/O In: -  Out: 250 [Urine:250] Weight change: 5.991 kg (13 lb 3.3 oz) Gen: obese AAF in NAD CVS: no rub Resp:cta CZY:[id number] Ext: venous stasis ulcers on right leg under wrappings, no edema   Recent Labs Lab 12/28/16 1704 12/29/16 1440 12/30/16 0241 12/31/16 0530  NA 137 137 134* 137  K 2.5* 3.1* 3.2* 3.6  CL 107 108 107 111  CO2 20* 21* 18* 15*  GLUCOSE 115* 117* 110* 85  BUN 35* 38* 42* 39*  CREATININE 2.55* 3.81* 4.09* 3.57*  CALCIUM 8.4* 7.8* 7.7* 8.0*   Liver Function Tests: No results for input(s): AST, ALT, ALKPHOS, BILITOT, PROT, ALBUMIN in the last 168 hours. No results for input(s): LIPASE, AMYLASE in the last 168 hours. No results for input(s): AMMONIA in the last 168 hours. CBC:  Recent Labs Lab 12/28/16 1704 12/29/16 0210 12/29/16 1440 12/30/16 0241 12/31/16 0530  WBC 12.3* 12.8* 15.2* 17.3* 12.4*  HGB 8.4* 8.1* 7.8* 8.5* 8.0*  HCT 25.9* 24.8* 24.0* 25.7* 24.4*  MCV 67.6* 67.0* 67.0* 66.6* 65.9*  PLT 252 224 208 211 227   Cardiac Enzymes:  Recent Labs Lab 12/28/16 2240 12/29/16 0210 12/29/16 1440  TROPONINI 0.05* 0.05* 0.05*   CBG:  Recent Labs Lab 12/29/16 0531  GLUCAP 119*    Iron Studies:  Recent Labs  12/28/16 2240  IRON 7*  TIBC 330  FERRITIN 61   Studies/Results: No results found. Marland Kitchen aspirin EC   81 mg Oral Daily  . buPROPion  150 mg Oral Daily  . cholecalciferol  5,000 Units Oral Daily  . colchicine  0.6 mg Oral Daily  . gabapentin  300 mg Oral QHS  . potassium chloride SA  40 mEq Oral BID  . Warfarin - Pharmacist Dosing Inpatient   Does not apply q1800    BMET    Component Value Date/Time   NA 137 12/31/2016 0530   K 3.6 12/31/2016 0530   CL 111 12/31/2016 0530   CO2 15 (L) 12/31/2016 0530   GLUCOSE 85 12/31/2016 0530   BUN 39 (H) 12/31/2016 0530   CREATININE 3.57 (H) 12/31/2016 0530   CALCIUM 8.0 (L) 12/31/2016 0530   GFRNONAA 14 (L) 12/31/2016 0530   GFRAA 17 (L) 12/31/2016 0530   CBC    Component Value Date/Time   WBC 12.4 (H) 12/31/2016 0530   RBC 3.70 (L) 12/31/2016 0530   HGB 8.0 (L) 12/31/2016 0530   HCT 24.4 (L) 12/31/2016 0530   PLT 227 12/31/2016 0530   MCV 65.9 (L) 12/31/2016 0530   MCH  21.6 (L) 12/31/2016 0530   MCHC 32.8 12/31/2016 0530   RDW 18.5 (H) 12/31/2016 0530   LYMPHSABS 2.1 10/01/2016 2335   MONOABS 0.5 10/01/2016 2335   EOSABS 0.1 10/01/2016 2335   BASOSABS 0.0 10/01/2016 2335     Assessment/Plan: 1.  AKI/CKD- in setting of urosepsis with hypotension, hypovolemia, and NSAIDs.  Currently non-oliguric.   1. Scr improving. 2. Continue with IVF's and antibiotics.   3. No indication for dialysis at this time.  4. Avoid NSAIDs and Cox-II I's in future 2. Enterobacter/E. Coli urosepsis- on Rocephin IV, continue with IVF's.  Still febrile and BP's remain low.  Per primary 3. Atypical chest pain, negative VQ scan 4. OSA- CPAP per primary 5. Morbid obesity 6. Iron deficient anemia- will need to r/o GIB given her NSAID use and coumadin.  Transfuse prn.  Will give IV Iron once she is more hemodynamically stable.    7. Venous stasis ulcer of right leg 8. Hypokalemia  9. Elevated troponin and new cardiomyopathy- EF 45-50% with diffuse hypokinesis.  Cardiology following.    Donetta Potts, MD [employer] [phone]

## 2016-12-31 NOTE — Progress Notes (Signed)
[facility]                                                                              Patient Demographics  Alice [name], is a 46 y.o. female, DOB - Apr 23, 1971, IFO:[id number]  Admit date - 12/28/2016   Admitting Physician Vianne Bulls, MD  Outpatient Primary MD for the patient is No primary care provider on file.  Outpatient specialists:   LOS - 3  days   Medical records reviewed and are as summarized below:    Chief Complaint  Patient presents with  . Back Pain  . Chest Pain  . Shortness of Breath       Brief summary   Patient is a 46 year old female with history of DVT, PE on warfarin, chronic back pain, hypertension, OSA on CPAP intolerance, presented to ED with chest pain, shortness of breath. Patient reported the pain as sharp, severe, constant on both side of the chest with dyspnea. Chest x-ray showed no pneumonia. VQ scan negative for PE. Troponin +0.05. Patient was admitted to stepdown unit. UA positive for UTI.   Assessment & Plan    Principal Problem:   Sepsis ([facility]) - Escherichia coli bacteremia, Escherichia coli UTI -Patient met sepsis criteria for 103, tachycardia 104, hypotension 97/51 UA positive for UTI, also being treated for venous ulcer on the right lower extremity  -  Appreciate Dr. Sharol Given for evaluating the venous ulcer on right lower extremity-> clean - Placed on 2 g IV Rocephin, continue IV fluids - Repeat blood cultures, Negative so far -Has right CVAT, likely has right-sided pyelonephritis clinically. Renal ultrasound was negative for any obstruction or hydronephrosis, pyelonephritis  - Continues to spike fevers, hypotensive, requested ID consult, discussed with Dr. Johnnye Sima  Active Problems: Acute UTI ([place]) -Urine cultures Escherichia coli, continue IV Rocephin  Atypical chest pain, new cardiomyopathy, elevated troponin - Unclear in etiology, patient reports constant chest pain with shortness of breath, worsens with  inspiration or positional changes -  Given history of DVT, PE, VQ scan was obtained which was negative for pulmonary embolism - Cardiology following, per Dr. Meda Coffee likely has presumed pericarditis, recommended start colchicine, plan on stress test or coronary CTA once recovered from acute illness     Acute kidney injury superimposed on CKD, stage III (HCC) - Serum creatinine 2.5 on admission, baseline 1.1 - Possible prerenal, dehydration and recent vomiting, severe sepsis medications HCTZ prior to admission - Creatinine worsened to 4.09, plateaued, improving to 3.5 today  - Nephrology following     hypotension  - Likely due to #1 sepsis, has a underlying history of hypertension -Continue to hold HCTZ, amlodipine, continue IV fluid hydration    History of pulmonary embolism. History of DVT (deep vein thrombosis) - Continue Coumadin per pharmacy, INR supratherapeutic - Repeat VQ scan showed no pulmonary embolism    OSA (obstructive sleep apnea) - Continue CPAP  Hypokalemia -Continue scheduled potassium replacement    Microcytic anemia - Fe 7, saturation ratio 2, suggesting microcytic anemia however patient has B12 deficiency as well. B12 level 173 - H&H stable    Venous ulcer of right lower extremity with varicose  veins ([place]) - Dr. Sharol Given following   Code Status: full  DVT Prophylaxis:  Warfarin on HOLD Family Communication: Discussed in detail with the patient, all imaging results, lab results explained to the patient   Disposition Plan: in SDU  Time Spent in minutes   25 minutes  Procedures:  Renal ultrasound  Consultants:   Cardiology   Dr. Sharol Given Infectious disease, Dr. Johnnye Sima  Antimicrobials:   IV Rocephin 8/16   Medications  Scheduled Meds: . aspirin EC  81 mg Oral Daily  . buPROPion  150 mg Oral Daily  . cholecalciferol  5,000 Units Oral Daily  . colchicine  0.6 mg Oral Daily  . gabapentin  300 mg Oral QHS  . potassium chloride SA  40 mEq Oral BID  .  Warfarin - Pharmacist Dosing Inpatient   Does not apply q1800   Continuous Infusions: . sodium chloride 125 mL/hr at 12/31/16 1253  . cefTRIAXone (ROCEPHIN)  IV Stopped (12/31/16 0947)   PRN Meds:.acetaminophen, albuterol, HYDROmorphone (DILAUDID) injection, nitroGLYCERIN, ondansetron (ZOFRAN) IV, oxyCODONE   Antibiotics   Anti-infectives    Start     Dose/Rate Route Frequency Ordered Stop   12/30/16 1000  cefTRIAXone (ROCEPHIN) 2 g in dextrose 5 % 50 mL IVPB     2 g 100 mL/hr over 30 Minutes Intravenous Every 24 hours 12/30/16 0645     12/29/16 1400  cefTRIAXone (ROCEPHIN) 1 g in dextrose 5 % 50 mL IVPB  Status:  Discontinued     1 g 100 mL/hr over 30 Minutes Intravenous Every 24 hours 12/29/16 1315 12/30/16 0645        Subjective:   Sakshi Sermons was seen and examined today. Still spiking fever, 102.5F. Does not feel too good. No nausea, vomiting, abdominal pain, very weak.   Objective:   Vitals:   12/31/16 0127 12/31/16 0404 12/31/16 1145 12/31/16 1242  BP:  (!) 101/58 123/72   Pulse:  95    Resp:  (!) 21    Temp: 100 F (37.8 C) 98.7 F (37.1 C)  (!) 102.4 F (39.1 C)  TempSrc: Oral Oral  Oral  SpO2:  94%  92%  Weight:  (!) 152.1 kg (335 lb 4.8 oz)    Height:        Intake/Output Summary (Last 24 hours) at 12/31/16 1316 Last data filed at 12/31/16 1124  Gross per 24 hour  Intake             3475 ml  Output             1400 ml  Net             2075 ml     Wt Readings from Last 3 Encounters:  12/31/16 (!) 152.1 kg (335 lb 4.8 oz)  12/22/16 (!) 154.2 kg (340 lb)  12/19/16 (!) 154.2 kg (340 lb)     Exam  General: Alert and oriented x 3, NAD, Ill-appearing Eyes HEENT:  Cardiovascular: S1 S2 auscultated, no rubs, murmurs or gallops. Regular rate and rhythm. No pedal edema b/l Respiratory: Clear to auscultation bilaterally, no wheezing, rales or rhonchi Gastrointestinal: Soft, nontender, nondistended, + bowel sounds Ext: dressing intact on the right  leg Neuro: no new deficits Musculoskeletal: No digital cyanosis, clubbing Skin: Dressing intact on the rt leg Psych: Normal affect and demeanor, alert and oriented x3      Data Reviewed:  I have personally reviewed following labs and imaging studies  Micro Results Recent Results (from the past 240  hour(s))  MRSA PCR Screening     Status: None   Collection Time: 12/29/16  5:32 AM  Result Value Ref Range Status   MRSA by PCR NEGATIVE NEGATIVE Final    Comment:        The GeneXpert MRSA Assay (FDA approved for NASAL specimens only), is one component of a comprehensive MRSA colonization surveillance program. It is not intended to diagnose MRSA infection nor to guide or monitor treatment for MRSA infections.   Culture, blood (routine x 2)     Status: Abnormal (Preliminary result)   Collection Time: 12/29/16  2:40 PM  Result Value Ref Range Status   Specimen Description BLOOD LEFT HAND  Final   Special Requests IN PEDIATRIC BOTTLE Blood Culture adequate volume  Final   Culture  Setup Time   Final    GRAM NEGATIVE RODS IN PEDIATRIC BOTTLE CRITICAL RESULT CALLED TO, READ BACK BY AND VERIFIED WITH: J.LEDFORD, PHARMD 12/30/16 0634 L.CHAMPION    Culture ESCHERICHIA COLI SUSCEPTIBILITIES TO FOLLOW  (A)  Final   Report Status PENDING  Incomplete  Culture, blood (routine x 2)     Status: None (Preliminary result)   Collection Time: 12/29/16  2:40 PM  Result Value Ref Range Status   Specimen Description BLOOD LEFT ARM  Final   Special Requests   Final    BOTTLES DRAWN AEROBIC ONLY Blood Culture adequate volume   Culture  Setup Time   Final    GRAM NEGATIVE RODS AEROBIC BOTTLE ONLY CRITICAL VALUE NOTED.  VALUE IS CONSISTENT WITH PREVIOUSLY REPORTED AND CALLED VALUE.    Culture GRAM NEGATIVE RODS  Final   Report Status PENDING  Incomplete  Blood Culture ID Panel (Reflexed)     Status: Abnormal   Collection Time: 12/29/16  2:40 PM  Result Value Ref Range Status   Enterococcus  species NOT DETECTED NOT DETECTED Final   Vancomycin resistance NOT DETECTED NOT DETECTED Final   Listeria monocytogenes NOT DETECTED NOT DETECTED Final   Staphylococcus species NOT DETECTED NOT DETECTED Final   Staphylococcus aureus NOT DETECTED NOT DETECTED Final   Methicillin resistance NOT DETECTED NOT DETECTED Final   Streptococcus species NOT DETECTED NOT DETECTED Final   Streptococcus agalactiae NOT DETECTED NOT DETECTED Final   Streptococcus pneumoniae NOT DETECTED NOT DETECTED Final   Streptococcus pyogenes NOT DETECTED NOT DETECTED Final   Acinetobacter baumannii NOT DETECTED NOT DETECTED Final   Enterobacteriaceae species DETECTED (A) NOT DETECTED Final    Comment: CRITICAL RESULT CALLED TO, READ BACK BY AND VERIFIED WITH: J.LEDFORD, PHARMD 12/30/16 0634 L.CHAMPION    Enterobacter cloacae complex NOT DETECTED NOT DETECTED Final   Escherichia coli DETECTED (A) NOT DETECTED Final    Comment: CRITICAL RESULT CALLED TO, READ BACK BY AND VERIFIED WITH: J.LEDFORD, PHARMD 12/30/16 0634 L.CHAMPION    Klebsiella oxytoca NOT DETECTED NOT DETECTED Final   Klebsiella pneumoniae NOT DETECTED NOT DETECTED Final   Proteus species NOT DETECTED NOT DETECTED Final   Serratia marcescens NOT DETECTED NOT DETECTED Final   Carbapenem resistance NOT DETECTED NOT DETECTED Final   Haemophilus influenzae NOT DETECTED NOT DETECTED Final   Neisseria meningitidis NOT DETECTED NOT DETECTED Final   Pseudomonas aeruginosa NOT DETECTED NOT DETECTED Final   Candida albicans NOT DETECTED NOT DETECTED Final   Candida glabrata NOT DETECTED NOT DETECTED Final   Candida krusei NOT DETECTED NOT DETECTED Final   Candida parapsilosis NOT DETECTED NOT DETECTED Final   Candida tropicalis NOT DETECTED NOT DETECTED Final  Urine  Culture     Status: Abnormal   Collection Time: 12/29/16  3:23 PM  Result Value Ref Range Status   Specimen Description URINE, CLEAN CATCH  Final   Special Requests NONE  Final   Culture  >=100,000 COLONIES/mL ESCHERICHIA COLI (A)  Final   Report Status 12/31/2016 FINAL  Final   Organism ID, Bacteria ESCHERICHIA COLI (A)  Final      Susceptibility   Escherichia coli - MIC*    AMPICILLIN >=32 RESISTANT Resistant     CEFAZOLIN <=4 SENSITIVE Sensitive     CEFTRIAXONE <=1 SENSITIVE Sensitive     CIPROFLOXACIN <=0.25 SENSITIVE Sensitive     GENTAMICIN <=1 SENSITIVE Sensitive     IMIPENEM <=0.25 SENSITIVE Sensitive     NITROFURANTOIN <=16 SENSITIVE Sensitive     TRIMETH/SULFA >=320 RESISTANT Resistant     AMPICILLIN/SULBACTAM 16 INTERMEDIATE Intermediate     PIP/TAZO <=4 SENSITIVE Sensitive     Extended ESBL NEGATIVE Sensitive     * >=100,000 COLONIES/mL ESCHERICHIA COLI    Radiology Reports US Renal  Result Date: 12/28/2016 CLINICAL DATA:  Acute kidney injury, pain in chills for 2 days EXAM: RENAL / URINARY TRACT ULTRASOUND COMPLETE COMPARISON:  05/02/2015, 05/17/2013 FINDINGS: Right Kidney: Length: 10.9 cm. Echogenicity within normal limits. No mass or hydronephrosis visualized. Left Kidney: Length: 11.7 cm. Echogenicity within normal limits. No mass or hydronephrosis visualized. Bladder: Appears normal for degree of bladder distention. IMPRESSION: Negative renal ultrasound Electronically Signed   By: Donavan Foil M.D.   On: 12/28/2016 22:05   Nm Pulmonary Vent And Perf (v/q Scan)  Result Date: 12/28/2016 CLINICAL DATA:  Chest pain and shortness of Breath EXAM: NUCLEAR MEDICINE VENTILATION - PERFUSION LUNG SCAN TECHNIQUE: Ventilation images were obtained in multiple projections using inhaled aerosol Tc-9mDTPA. Perfusion images were obtained in multiple projections after intravenous injection of Tc-950mAA. RADIOPHARMACEUTICALS:  31.6 mCi Technetium-9931mPA aerosol inhalation and 4.24 mCi Technetium-69m64m IV COMPARISON:  Chest x-ray from earlier in the same day. FINDINGS: Ventilation: No focal ventilation defect. Perfusion: Lateral imaging could not be performed due the  patient's size and cold for although the remaining perfusion images show no significant perfusion defect. IMPRESSION: No evidence of pulmonary embolism. Electronically Signed   By: MarkInez Catalina.   On: 12/28/2016 19:40   Dg Chest Portable 1 View  Result Date: 12/28/2016 CLINICAL DATA:  Shortness of breath and chest pain EXAM: PORTABLE CHEST 1 VIEW COMPARISON:  April 19, 2015 FINDINGS: Lungs are clear. Heart is borderline enlarged with pulmonary vascularity within normal limits. No adenopathy. No pneumothorax. No bone lesions. IMPRESSION: Borderline cardiac enlargement.  No edema or consolidation. Electronically Signed   By: WillLowella Grip M.D.   On: 12/28/2016 17:09    Lab Data:  CBC:  Recent Labs Lab 12/28/16 1704 12/29/16 0210 12/29/16 1440 12/30/16 0241 12/31/16 0530  WBC 12.3* 12.8* 15.2* 17.3* 12.4*  HGB 8.4* 8.1* 7.8* 8.5* 8.0*  HCT 25.9* 24.8* 24.0* 25.7* 24.4*  MCV 67.6* 67.0* 67.0* 66.6* 65.9*  PLT 252 224 208 211 227 786asic Metabolic Panel:  Recent Labs Lab 12/28/16 1704 12/29/16 1440 12/30/16 0241 12/31/16 0530  NA 137 137 134* 137  K 2.5* 3.1* 3.2* 3.6  CL 107 108 107 111  CO2 20* 21* 18* 15*  GLUCOSE 115* 117* 110* 85  BUN 35* 38* 42* 39*  CREATININE 2.55* 3.81* 4.09* 3.57*  CALCIUM 8.4* 7.8* 7.7* 8.0*  MG 1.9 1.8  --   --  GFR: Estimated Creatinine Clearance: 31.5 mL/min (A) (by C-G formula based on SCr of 3.57 mg/dL (H)). Liver Function Tests: No results for input(s): AST, ALT, ALKPHOS, BILITOT, PROT, ALBUMIN in the last 168 hours. No results for input(s): LIPASE, AMYLASE in the last 168 hours. No results for input(s): AMMONIA in the last 168 hours. Coagulation Profile:  Recent Labs Lab 12/28/16 1704 12/29/16 0210 12/29/16 1440 12/30/16 0241 12/31/16 0530  INR 2.96 2.97 3.23 4.33* 6.39*   Cardiac Enzymes:  Recent Labs Lab 12/28/16 2240 12/29/16 0210 12/29/16 1440  TROPONINI 0.05* 0.05* 0.05*   BNP (last 3 results) No  results for input(s): PROBNP in the last 8760 hours. HbA1C: No results for input(s): HGBA1C in the last 72 hours. CBG:  Recent Labs Lab 12/29/16 0531  GLUCAP 119*   Lipid Profile: No results for input(s): CHOL, HDL, LDLCALC, TRIG, CHOLHDL, LDLDIRECT in the last 72 hours. Thyroid Function Tests: No results for input(s): TSH, T4TOTAL, FREET4, T3FREE, THYROIDAB in the last 72 hours. Anemia Panel:  Recent Labs  12/28/16 2240  VITAMINB12 173*  FOLATE 7.7  FERRITIN 61  TIBC 330  IRON 7*  RETICCTPCT 0.8   Urine analysis:    Component Value Date/Time   COLORURINE YELLOW 12/28/2016 2200   APPEARANCEUR CLOUDY (A) 12/28/2016 2200   LABSPEC 1.012 12/28/2016 2200   PHURINE 5.0 12/28/2016 2200   GLUCOSEU NEGATIVE 12/28/2016 2200   HGBUR LARGE (A) 12/28/2016 2200   BILIRUBINUR NEGATIVE 12/28/2016 2200   KETONESUR NEGATIVE 12/28/2016 2200   PROTEINUR 100 (A) 12/28/2016 2200   UROBILINOGEN 4.0 (H) 11/25/2013 1511   NITRITE POSITIVE (A) 12/28/2016 2200   LEUKOCYTESUR LARGE (A) 12/28/2016 2200     Ripudeep Rai M.D. [facility] 12/31/2016, 1:16 PM  Pager: [phone] Between 7am to 7pm - call Pager - [phone]  After 7pm go to [email or web address] - password [email or web address]  Call night coverage person covering after 7pm

## 2016-12-31 NOTE — Progress Notes (Signed)
Progress Note  Patient Name: Alice [name] Date of Encounter: 12/31/2016  Primary Cardiologist: Kathrin Ruddy   Subjective   46 year old female with a history of DVT, pulmonary list, morbid obesity, hypertension was admitted profound sepsis syndrome and with pleuritic chest pain.  She has acute renal insufficiency with a creatinine of 4.09. Pro-calcitonin level is 42.89  -> 21.09. Troponin = 0.05 with a flat trend - not suggestive of ACS .  Has a cough with pleuretic CP   Inpatient Medications    Scheduled Meds: . aspirin EC  81 mg Oral Daily  . buPROPion  150 mg Oral Daily  . cholecalciferol  5,000 Units Oral Daily  . colchicine  0.6 mg Oral Daily  . gabapentin  300 mg Oral QHS  . potassium chloride SA  40 mEq Oral BID  . Warfarin - Pharmacist Dosing Inpatient   Does not apply q1800   Continuous Infusions: . sodium chloride 125 mL/hr at 12/29/16 1420  . cefTRIAXone (ROCEPHIN)  IV 2 g (12/31/16 0917)   PRN Meds: acetaminophen, albuterol, HYDROmorphone (DILAUDID) injection, nitroGLYCERIN, ondansetron (ZOFRAN) IV, oxyCODONE   Vital Signs    Vitals:   12/30/16 1938 12/31/16 0007 12/31/16 0127 12/31/16 0404  BP: (!) 117/51 125/60  (!) 101/58  Pulse: 77 (!) 118  95  Resp: (!) 25 (!) 21  (!) 21  Temp: 98.4 F (36.9 C) (!) 102.7 F (39.3 C) 100 F (37.8 C) 98.7 F (37.1 C)  TempSrc: Oral Oral Oral Oral  SpO2: 100% 94%  94%  Weight:    (!) 335 lb 4.8 oz (152.1 kg)  Height:        Intake/Output Summary (Last 24 hours) at 12/31/16 1117 Last data filed at 12/31/16 0500  Gross per 24 hour  Intake             3715 ml  Output             1150 ml  Net             2565 ml   Filed Weights   12/29/16 0500 12/30/16 0401 12/31/16 0404  Weight: (!) 316 lb 9.3 oz (143.6 kg) (!) 322 lb 1.5 oz (146.1 kg) (!) 335 lb 4.8 oz (152.1 kg)    Telemetry    Sinus tach 107 - Personally Reviewed  ECG     NSR no ST or T wave abn.  - Personally Reviewed  Physical Exam   GEN:  Middle-aged female. She  appears to be mildly uncomfortable. She is in no acute distress. Neck: No JVD Cardiac: RRR, no murmurs, no  rubs, or gallops.  Respiratory: Clear to auscultation bilaterally. GI: Soft, nontender, non-distended  MS: No edema; No deformity. Neuro:  Nonfocal  Psych: Normal affect   Labs    Chemistry Recent Labs Lab 12/29/16 1440 12/30/16 0241 12/31/16 0530  NA 137 134* 137  K 3.1* 3.2* 3.6  CL 108 107 111  CO2 21* 18* 15*  GLUCOSE 117* 110* 85  BUN 38* 42* 39*  CREATININE 3.81* 4.09* 3.57*  CALCIUM 7.8* 7.7* 8.0*  GFRNONAA 13* 12* 14*  GFRAA 16* 14* 17*  ANIONGAP 8 9 11      Hematology Recent Labs Lab 12/29/16 1440 12/30/16 0241 12/31/16 0530  WBC 15.2* 17.3* 12.4*  RBC 3.58* 3.86* 3.70*  HGB 7.8* 8.5* 8.0*  HCT 24.0* 25.7* 24.4*  MCV 67.0* 66.6* 65.9*  MCH 21.8* 22.0* 21.6*  MCHC 32.5 33.1 32.8  RDW 18.0* 18.1* 18.5*  PLT 208 211 227    Cardiac Enzymes Recent Labs Lab 12/28/16 2240 12/29/16 0210 12/29/16 1440  TROPONINI 0.05* 0.05* 0.05*    Recent Labs Lab 12/28/16 1709  TROPIPOC 0.01     BNP Recent Labs Lab 12/28/16 1704  BNP 58.3     DDimer  Recent Labs Lab 12/28/16 1704  DDIMER 1.04*     Radiology    No results found.  Cardiac Studies     Patient Profile     46 y.o. female admitted with sepsis syndrome. She was found to have minimally elevated troponin levels but that this is in the setting of acute renal failure and sepsis syndrome.  Assessment & Plan    1. Question of pericarditis: Do not think that the patient has pericarditis. There is no EKG evidence of pericarditis. She has sepsis syndrome and a trivial elevation in troponin levels. This elevation in troponin is likely due to her acute renal insufficiency with a creatinine of 4 and the setting of profound sepsis.  Her pleuritic chest pain is more likely to be pleuritis and not pericarditis. We'll stop the colchicine and observe. As her  creatinine improves, we can consider restarting the culture seen if needed.  2.   Mild acute systolic congestive heart failure: I suspect that this is likely due to her sepsis syndrome. She's not had any episodes of angina and her troponin levels are more reflective of sepsis syndrome in the setting of acute renal insufficiency.  Dr. Meda Coffee has tentatively recommended coronary CTA once the patient has recovered.    Signed, Mertie Moores, MD  12/31/2016, 11:17 AM

## 2016-12-31 NOTE — Plan of Care (Signed)
Problem: Safety: Goal: Ability to remain free from injury will improve Outcome: Progressing Patient uses her call light for assistance and waits for someone to come and she also has family to assist her, personal items within reach and her call light is in her bed, will continue to monitor.

## 2016-12-31 NOTE — Plan of Care (Signed)
Problem: Physical Regulation: Goal: Ability to maintain clinical measurements within normal limits will improve Outcome: Progressing Pt having intermittent rigors/shaking. C/O chills. Fever 102 overnight. Tylenol administered. Now afebrile. Still intermittent Tachycardic.Diaphoretic intermittently. Pt feeling very fatigued. Up to [facility] w/ assistance. Also c/o numbness in R Thigh X2 days. Slight improvement w/ positioning. Labs reviewed. Medications administered. PRN medication administered for pain. Will continue to monitor closely.

## 2016-12-31 NOTE — Progress Notes (Signed)
CRITICAL VALUE ALERT  Critical Value:  INR 6.39  Date & Time Notied:  12/31/16 4069  Provider Notified: NP, Kennon Holter, on call  Orders Received/Actions taken: No New orders recieved

## 2016-12-31 NOTE — Consult Note (Signed)
[facility] for Infectious Disease  Date of Admission:  12/28/2016  Date of Consult:  12/31/2016  Reason for Consult: fever Referring Physician: Rai  Impression/Recommendation Fever E coli bacteremia/sepsis, UTI Would continue ceftriaxone If she continues to have fever, would change to cipro If she continues to have fever, would consider repeating her renal imaging to r/o perinephric abscess. Seems very unlikely given she had normal u/s on 8-15.   ARF Cr improved today (4.09 --> 3.57) Due to sepsis/hypotension, NSAID use.   Venous stasis ulcer- eval by Dr Sharol Given- clean.   Microcytic Anemia Will defer to primary to w/u  Thank you so much for this interesting consult,   Bobby Rumpf (pager) [phone] [email or web address]  Alice [name] is an 46 y.o. female.  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   Ini Ed she was found to have ARF (1.15 --> 2.55) and WBC of 12.3. She had v/q scan (-).  She was eval by [facility] for a full thickness wound ( due to venous stasis) on her RLE. She has an [place] compression wrap on this.  She has previously had skin grafting with VAC. States she had had for "years".  In hospital on 8-16, she developed temp 103 and hypotension. She was started on ceftriaxone with presumption she had UTI. She had normal renal u/s on 8-15.  On 8-17 her BCx was found to be positive for E coli.  She was also eval by [facility] and started on colchicine for presumed pericarditis. This has subsequently been stopped.  Her WBC has improved to 12.4 but she continues to have fever (102.4 today).   Repeat BCx sent 8-17, pending.   Past Medical History:  Diagnosis Date  . Anemia   . Anxiety   . Chronic bronchitis ([place])   . Chronic upper back pain   . Depression   . DVT (deep venous thrombosis) ([facility])    BLE  . GERD (gastroesophageal reflux disease)   . Headache    "weekly" (04/16/2015)  .  Hypertension   . Migraine    "monthly" (04/16/2015)  . Obesity   . Pneumonia 2014  . Pulmonary embolism ([place])   . Sinusitis nasal   . Sleep apnea    "I'm suppose to wear a mask but I don't" (04/16/2015)  . Varicose veins of right lower extremity   . Venous stasis of lower extremity    right    Past Surgical History:  Procedure Laterality Date  . CHOLECYSTECTOMY N/A 04/18/2015   Procedure: LAPAROSCOPIC CHOLECYSTECTOMY;  Surgeon: Ralene Ok, MD;  Location: [facility];  Service: General;  Laterality: N/A;  . HYSTEROSCOPY W/D&C  12/24/2001   Archie Endo 09/28/2010  . I&D EXTREMITY Right 07/25/2016   Procedure: IRRIGATION AND DEBRIDEMENT RIGHT LEG ULCER, APPLY VERAFLO VAC;  Surgeon: [name], MD;  Location: [facility];  Service: Orthopedics;  Laterality: Right;  . INCISE AND DRAIN ABCESS Right 07/14/2016  . INCISION AND DRAINAGE Right 09/10/2008   leg:  skin and soft tissue and muscle/notes 09/15/2010  . INCISION AND DRAINAGE Right 01/01/2008   Chronic venous stasis insufficiency ulcer,/notes 09/14/2010/  . INCISION AND DRAINAGE Right 07/4354   calf w/application wound vac/notes 07/20/2010  . INCISION AND DRAINAGE Right 07/25/2016   IRRIGATION AND DEBRIDEMENT RIGHT LEG ULCER,  . LAPAROSCOPIC GASTRIC BYPASS  ~ 2007  . SKIN GRAFT SPLIT THICKNESS LEG / FOOT Right  07/25/2016   LEG  . SKIN SPLIT GRAFT Right 07/27/2016   Procedure: SKIN GRAFT RIGHT LEG WITH THERASKIN APPLICATION;  Surgeon: [name], MD;  Location: [facility];  Service: Orthopedics;  Laterality: Right;  . TONSILLECTOMY       Allergies  Allergen Reactions  . Oxycodone Hcl Nausea Only  . Vicodin [Hydrocodone-Acetaminophen] Nausea And Vomiting    Medications:  Scheduled: . aspirin EC  81 mg Oral Daily  . buPROPion  150 mg Oral Daily  . cholecalciferol  5,000 Units Oral Daily  . colchicine  0.6 mg Oral Daily  . gabapentin  300 mg Oral QHS  . potassium chloride SA  40 mEq Oral BID  . Warfarin - Pharmacist Dosing Inpatient   Does not  apply q1800    Abtx:  Anti-infectives    Start     Dose/Rate Route Frequency Ordered Stop   12/30/16 1000  cefTRIAXone (ROCEPHIN) 2 g in dextrose 5 % 50 mL IVPB     2 g 100 mL/hr over 30 Minutes Intravenous Every 24 hours 12/30/16 0645     12/29/16 1400  cefTRIAXone (ROCEPHIN) 1 g in dextrose 5 % 50 mL IVPB  Status:  Discontinued     1 g 100 mL/hr over 30 Minutes Intravenous Every 24 hours 12/29/16 1315 12/30/16 0645      Total days of antibiotics: 2 ceftriaxone          Social History:  reports that she has never smoked. She has never used smokeless tobacco. She reports that she does not drink alcohol or use drugs.  Family History  Problem Relation Age of Onset  . Kidney disease Mother        kidney transplant    History obtained from chart review and the patient General ROS: no cough. normal BM, no dysuria, +dark urine, no hematuria. +weakness, DOE. see HPI Please see HPI. 12 point ROS o/w (-)  Blood pressure 123/72, pulse 95, temperature (!) 102.4 F (39.1 C), temperature source Oral, resp. rate (!) 21, height _0  (1.727 m), weight (!) 152.1 kg (335 lb 4.8 oz), SpO2 92 %. General appearance: alert, cooperative, mild distress and morbidly obese Eyes: negative findings: conjunctivae and sclerae normal and pupils equal, round, reactive to light and accomodation Throat: normal findings: oropharynx pink & moist without lesions or evidence of thrush Neck: no adenopathy and supple, symmetrical, trachea midline Lungs: clear to auscultation bilaterally Heart: regular rate and rhythm Abdomen: normal findings: bowel sounds normal and abnormal findings:  soft. mild diffuse, subjective tenderness.  Extremities: edema anasarca, obesity and RLE dressed from ankle to knee. no proximal erythema.    Results for orders placed or performed during the hospital encounter of 12/28/16 (from the past 48 hour(s))  Urine Culture     Status: Abnormal   Collection Time: 12/29/16  3:23 PM  Result  Value Ref Range   Specimen Description URINE, CLEAN CATCH    Special Requests NONE    Culture >=100,000 COLONIES/mL ESCHERICHIA COLI (A)    Report Status 12/31/2016 FINAL    Organism ID, Bacteria ESCHERICHIA COLI (A)       Susceptibility   Escherichia coli - MIC*    AMPICILLIN >=32 RESISTANT Resistant     CEFAZOLIN <=4 SENSITIVE Sensitive     CEFTRIAXONE <=1 SENSITIVE Sensitive     CIPROFLOXACIN <=0.25 SENSITIVE Sensitive     GENTAMICIN <=1 SENSITIVE Sensitive     IMIPENEM <=0.25 SENSITIVE Sensitive     NITROFURANTOIN <=16 SENSITIVE Sensitive  TRIMETH/SULFA >=320 RESISTANT Resistant     AMPICILLIN/SULBACTAM 16 INTERMEDIATE Intermediate     PIP/TAZO <=4 SENSITIVE Sensitive     Extended ESBL NEGATIVE Sensitive     * >=100,000 COLONIES/mL ESCHERICHIA COLI  CBC     Status: Abnormal   Collection Time: 12/30/16  2:41 AM  Result Value Ref Range   WBC 17.3 (H) 4.0 - 10.5 K/uL   RBC 3.86 (L) 3.87 - 5.11 MIL/uL   Hemoglobin 8.5 (L) 12.0 - 15.0 g/dL   HCT 25.7 (L) 36.0 - 46.0 %   MCV 66.6 (L) 78.0 - 100.0 fL   MCH 22.0 (L) 26.0 - 34.0 pg   MCHC 33.1 30.0 - 36.0 g/dL   RDW 18.1 (H) 11.5 - 15.5 %   Platelets 211 150 - 400 K/uL  Protime-INR     Status: Abnormal   Collection Time: 12/30/16  2:41 AM  Result Value Ref Range   Prothrombin Time 42.7 (H) 11.4 - 15.2 seconds   INR 4.33 (HH)     Comment: REPEATED TO VERIFY CRITICAL RESULT CALLED TO, READ BACK BY AND VERIFIED WITH: R YOCUM,RN 0319 12/30/16 D BRADLEY   Basic metabolic panel     Status: Abnormal   Collection Time: 12/30/16  2:41 AM  Result Value Ref Range   Sodium 134 (L) 135 - 145 mmol/L   Potassium 3.2 (L) 3.5 - 5.1 mmol/L   Chloride 107 101 - 111 mmol/L   CO2 18 (L) 22 - 32 mmol/L   Glucose, Bld 110 (H) 65 - 99 mg/dL   BUN 42 (H) 6 - 20 mg/dL   Creatinine, Ser 4.09 (H) 0.44 - 1.00 mg/dL   Calcium 7.7 (L) 8.9 - 10.3 mg/dL   GFR calc non Af Amer 12 (L) >60 mL/min   GFR calc Af Amer 14 (L) >60 mL/min    Comment:  (NOTE) The eGFR has been calculated using the CKD EPI equation. This calculation has not been validated in all clinical situations. eGFR's persistently <60 mL/min signify possible Chronic Kidney Disease.    Anion gap 9 5 - 15  CBC     Status: Abnormal   Collection Time: 12/31/16  5:30 AM  Result Value Ref Range   WBC 12.4 (H) 4.0 - 10.5 K/uL   RBC 3.70 (L) 3.87 - 5.11 MIL/uL   Hemoglobin 8.0 (L) 12.0 - 15.0 g/dL   HCT 24.4 (L) 36.0 - 46.0 %   MCV 65.9 (L) 78.0 - 100.0 fL   MCH 21.6 (L) 26.0 - 34.0 pg   MCHC 32.8 30.0 - 36.0 g/dL   RDW 18.5 (H) 11.5 - 15.5 %   Platelets 227 150 - 400 K/uL  Protime-INR     Status: Abnormal   Collection Time: 12/31/16  5:30 AM  Result Value Ref Range   Prothrombin Time 58.2 (H) 11.4 - 15.2 seconds    Comment: REPEATED TO VERIFY   INR 6.39 (HH)     Comment: REPEATED TO VERIFY CRITICAL RESULT CALLED TO, READ BACK BY AND VERIFIED WITH: Madolyn Frieze RN AT [phone] ON 08.18.2018 BY COCHRANE S   Basic metabolic panel     Status: Abnormal   Collection Time: 12/31/16  5:30 AM  Result Value Ref Range   Sodium 137 135 - 145 mmol/L   Potassium 3.6 3.5 - 5.1 mmol/L   Chloride 111 101 - 111 mmol/L   CO2 15 (L) 22 - 32 mmol/L   Glucose, Bld 85 65 - 99 mg/dL   BUN 39 (  H) 6 - 20 mg/dL   Creatinine, Ser 3.57 (H) 0.44 - 1.00 mg/dL   Calcium 8.0 (L) 8.9 - 10.3 mg/dL   GFR calc non Af Amer 14 (L) >60 mL/min   GFR calc Af Amer 17 (L) >60 mL/min    Comment: (NOTE) The eGFR has been calculated using the CKD EPI equation. This calculation has not been validated in all clinical situations. eGFR's persistently <60 mL/min signify possible Chronic Kidney Disease.    Anion gap 11 5 - 15  Procalcitonin     Status: None   Collection Time: 12/31/16  5:30 AM  Result Value Ref Range   Procalcitonin 21.09 ng/mL    Comment:        Interpretation: PCT >= 10 ng/mL: Important systemic inflammatory response, almost exclusively due to severe bacterial sepsis or septic  shock. (NOTE)         ICU PCT Algorithm               Non ICU PCT Algorithm    ----------------------------     ------------------------------         PCT < 0.25 ng/mL                 PCT < 0.1 ng/mL     Stopping of antibiotics            Stopping of antibiotics       strongly encouraged.               strongly encouraged.    ----------------------------     ------------------------------       PCT level decrease by               PCT < 0.25 ng/mL       >= 80% from peak PCT       OR PCT 0.25 - 0.5 ng/mL          Stopping of antibiotics                                             encouraged.     Stopping of antibiotics           encouraged.    ----------------------------     ------------------------------       PCT level decrease by              PCT >= 0.25 ng/mL       < 80% from peak PCT        AND PCT >= 0.5 ng/mL             Continuing antibiotics                                              encouraged.       Continuing antibiotics            encouraged.    ----------------------------     ------------------------------     PCT level increase compared          PCT > 0.5 ng/mL         with peak PCT AND          PCT >= 0.5 ng/mL  Escalation of antibiotics                                          strongly encouraged.      Escalation of antibiotics        strongly encouraged.       Component Value Date/Time   SDES URINE, CLEAN CATCH 12/29/2016 1523   SPECREQUEST NONE 12/29/2016 1523   CULT >=100,000 COLONIES/mL ESCHERICHIA COLI (A) 12/29/2016 1523   REPTSTATUS 12/31/2016 FINAL 12/29/2016 1523   No results found. Recent Results (from the past 240 hour(s))  MRSA PCR Screening     Status: None   Collection Time: 12/29/16  5:32 AM  Result Value Ref Range Status   MRSA by PCR NEGATIVE NEGATIVE Final    Comment:        The GeneXpert MRSA Assay (FDA approved for NASAL specimens only), is one component of a comprehensive MRSA colonization surveillance program. It is  not intended to diagnose MRSA infection nor to guide or monitor treatment for MRSA infections.   Culture, blood (routine x 2)     Status: Abnormal (Preliminary result)   Collection Time: 12/29/16  2:40 PM  Result Value Ref Range Status   Specimen Description BLOOD LEFT HAND  Final   Special Requests IN PEDIATRIC BOTTLE Blood Culture adequate volume  Final   Culture  Setup Time   Final    GRAM NEGATIVE RODS IN PEDIATRIC BOTTLE CRITICAL RESULT CALLED TO, READ BACK BY AND VERIFIED WITH: J.LEDFORD, PHARMD 12/30/16 0634 L.CHAMPION    Culture ESCHERICHIA COLI SUSCEPTIBILITIES TO FOLLOW  (A)  Final   Report Status PENDING  Incomplete  Culture, blood (routine x 2)     Status: None (Preliminary result)   Collection Time: 12/29/16  2:40 PM  Result Value Ref Range Status   Specimen Description BLOOD LEFT ARM  Final   Special Requests   Final    BOTTLES DRAWN AEROBIC ONLY Blood Culture adequate volume   Culture  Setup Time   Final    GRAM NEGATIVE RODS AEROBIC BOTTLE ONLY CRITICAL VALUE NOTED.  VALUE IS CONSISTENT WITH PREVIOUSLY REPORTED AND CALLED VALUE.    Culture GRAM NEGATIVE RODS  Final   Report Status PENDING  Incomplete  Blood Culture ID Panel (Reflexed)     Status: Abnormal   Collection Time: 12/29/16  2:40 PM  Result Value Ref Range Status   Enterococcus species NOT DETECTED NOT DETECTED Final   Vancomycin resistance NOT DETECTED NOT DETECTED Final   Listeria monocytogenes NOT DETECTED NOT DETECTED Final   Staphylococcus species NOT DETECTED NOT DETECTED Final   Staphylococcus aureus NOT DETECTED NOT DETECTED Final   Methicillin resistance NOT DETECTED NOT DETECTED Final   Streptococcus species NOT DETECTED NOT DETECTED Final   Streptococcus agalactiae NOT DETECTED NOT DETECTED Final   Streptococcus pneumoniae NOT DETECTED NOT DETECTED Final   Streptococcus pyogenes NOT DETECTED NOT DETECTED Final   Acinetobacter baumannii NOT DETECTED NOT DETECTED Final    Enterobacteriaceae species DETECTED (A) NOT DETECTED Final    Comment: CRITICAL RESULT CALLED TO, READ BACK BY AND VERIFIED WITH: J.LEDFORD, PHARMD 12/30/16 0634 L.CHAMPION    Enterobacter cloacae complex NOT DETECTED NOT DETECTED Final   Escherichia coli DETECTED (A) NOT DETECTED Final    Comment: CRITICAL RESULT CALLED TO, READ BACK BY AND VERIFIED WITH: J.LEDFORD, PHARMD 12/30/16 0634 L.CHAMPION    Klebsiella oxytoca  NOT DETECTED NOT DETECTED Final   Klebsiella pneumoniae NOT DETECTED NOT DETECTED Final   Proteus species NOT DETECTED NOT DETECTED Final   Serratia marcescens NOT DETECTED NOT DETECTED Final   Carbapenem resistance NOT DETECTED NOT DETECTED Final   Haemophilus influenzae NOT DETECTED NOT DETECTED Final   Neisseria meningitidis NOT DETECTED NOT DETECTED Final   Pseudomonas aeruginosa NOT DETECTED NOT DETECTED Final   Candida albicans NOT DETECTED NOT DETECTED Final   Candida glabrata NOT DETECTED NOT DETECTED Final   Candida krusei NOT DETECTED NOT DETECTED Final   Candida parapsilosis NOT DETECTED NOT DETECTED Final   Candida tropicalis NOT DETECTED NOT DETECTED Final  Urine Culture     Status: Abnormal   Collection Time: 12/29/16  3:23 PM  Result Value Ref Range Status   Specimen Description URINE, CLEAN CATCH  Final   Special Requests NONE  Final   Culture >=100,000 COLONIES/mL ESCHERICHIA COLI (A)  Final   Report Status 12/31/2016 FINAL  Final   Organism ID, Bacteria ESCHERICHIA COLI (A)  Final      Susceptibility   Escherichia coli - MIC*    AMPICILLIN >=32 RESISTANT Resistant     CEFAZOLIN <=4 SENSITIVE Sensitive     CEFTRIAXONE <=1 SENSITIVE Sensitive     CIPROFLOXACIN <=0.25 SENSITIVE Sensitive     GENTAMICIN <=1 SENSITIVE Sensitive     IMIPENEM <=0.25 SENSITIVE Sensitive     NITROFURANTOIN <=16 SENSITIVE Sensitive     TRIMETH/SULFA >=320 RESISTANT Resistant     AMPICILLIN/SULBACTAM 16 INTERMEDIATE Intermediate     PIP/TAZO <=4 SENSITIVE Sensitive      Extended ESBL NEGATIVE Sensitive     * >=100,000 COLONIES/mL ESCHERICHIA COLI      12/31/2016, 3:00 PM     LOS: 3 days    Records and images were personally reviewed where available.  Bobby Rumpf, MD [facility] [place] [facility] [phone] 12/31/2016, 3:00 PM

## 2017-01-01 ENCOUNTER — Inpatient Hospital Stay (HOSPITAL_COMMUNITY): Payer: Medicare Other

## 2017-01-01 DIAGNOSIS — A4151 Sepsis due to Escherichia coli [E. coli]: Secondary | ICD-10-CM

## 2017-01-01 LAB — GLUCOSE, CAPILLARY: Glucose-Capillary: 87 mg/dL (ref 65–99)

## 2017-01-01 LAB — CULTURE, BLOOD (ROUTINE X 2)
SPECIAL REQUESTS: ADEQUATE
SPECIAL REQUESTS: ADEQUATE

## 2017-01-01 LAB — BASIC METABOLIC PANEL WITH GFR
Anion gap: 8 (ref 5–15)
BUN: 29 mg/dL — ABNORMAL HIGH (ref 6–20)
CO2: 15 mmol/L — ABNORMAL LOW (ref 22–32)
Calcium: 8.1 mg/dL — ABNORMAL LOW (ref 8.9–10.3)
Chloride: 111 mmol/L (ref 101–111)
Creatinine, Ser: 2.28 mg/dL — ABNORMAL HIGH (ref 0.44–1.00)
GFR calc Af Amer: 29 mL/min — ABNORMAL LOW (ref >60)
GFR calc non Af Amer: 25 mL/min — ABNORMAL LOW (ref >60)
Glucose, Bld: 128 mg/dL — ABNORMAL HIGH (ref 65–99)
Potassium: 3.2 mmol/L — ABNORMAL LOW (ref 3.5–5.1)
Sodium: 134 mmol/L — ABNORMAL LOW (ref 135–145)

## 2017-01-01 LAB — PROTIME-INR
INR: 5.75
Prothrombin Time: 53.5 s — ABNORMAL HIGH (ref 11.4–15.2)

## 2017-01-01 LAB — CBC
HCT: 25 % — ABNORMAL LOW (ref 36.0–46.0)
Hemoglobin: 8.3 g/dL — ABNORMAL LOW (ref 12.0–15.0)
MCH: 21.6 pg — ABNORMAL LOW (ref 26.0–34.0)
MCHC: 33.2 g/dL (ref 30.0–36.0)
MCV: 64.9 fL — ABNORMAL LOW (ref 78.0–100.0)
PLATELETS: 240 10*3/uL (ref 150–400)
RBC: 3.85 MIL/uL — ABNORMAL LOW (ref 3.87–5.11)
RDW: 18.6 % — ABNORMAL HIGH (ref 11.5–15.5)
WBC: 9.5 10*3/uL (ref 4.0–10.5)

## 2017-01-01 MED ORDER — NYSTATIN 100000 UNIT/ML MT SUSP
5.0000 mL | Freq: Four times a day (QID) | OROMUCOSAL | Status: DC
  Administered 2017-01-01 – 2017-01-03 (×6): 500000 [IU] via ORAL
  Filled 2017-01-01 (×7): qty 5

## 2017-01-01 MED ORDER — STERILE WATER FOR INJECTION IV SOLN
150.0000 meq | INTRAVENOUS | Status: DC
  Administered 2017-01-01: 150 meq via INTRAVENOUS
  Filled 2017-01-01 (×5): qty 850

## 2017-01-01 MED ORDER — POTASSIUM CHLORIDE CRYS ER 20 MEQ PO TBCR
40.0000 meq | EXTENDED_RELEASE_TABLET | Freq: Three times a day (TID) | ORAL | Status: DC
  Administered 2017-01-01 – 2017-01-02 (×2): 40 meq via ORAL
  Filled 2017-01-01 (×3): qty 2

## 2017-01-01 MED ORDER — BLISTEX MEDICATED EX OINT
TOPICAL_OINTMENT | CUTANEOUS | Status: DC | PRN
  Filled 2017-01-01: qty 6.3

## 2017-01-01 NOTE — Progress Notes (Addendum)
Pt had c/o of pain on her right lip, ? Fever blister noted, she also has been noted to have oral thrush. Pt is requesting for nystatin or magic mouthwash if applicable. Dr. Kennon Holter text paged.

## 2017-01-01 NOTE — Progress Notes (Addendum)
Patient ID: AERICA [name], female   DOB: 07-16-1970, 46 y.o.   MRN: [id number] S:Feeling a little better O:BP 121/87 (BP Location: Right Arm)   Pulse 99   Temp 99.9 F (37.7 C) (Oral)   Resp (!) 24   Ht 5\' 8"  (1.727 m)   Wt (!) 150.2 kg (331 lb 2.1 oz)   SpO2 91%   BMI 50.35 kg/m   Intake/Output Summary (Last 24 hours) at 01/01/17 1531 Last data filed at 01/01/17 0500  Gross per 24 hour  Intake             1725 ml  Output              975 ml  Net              750 ml   Intake/Output: I/O last 3 completed shifts: In: 68 [P.O.:1200; I.V.:4000] Out: 2375 [Urine:2375]  Intake/Output this shift:  No intake/output data recorded. Weight change: -1.891 kg (-4 lb 2.7 oz) Gen:NAD CVS: no rub Resp:cta OQH:UTMLYY Ext:no edema, venous stasis ulcers on right lower leg under wrappings.   Recent Labs Lab 12/28/16 1704 12/29/16 1440 12/30/16 0241 12/31/16 0530 01/01/17 0934  NA 137 137 134* 137 134*  K 2.5* 3.1* 3.2* 3.6 3.2*  CL 107 108 107 111 111  CO2 20* 21* 18* 15* 15*  GLUCOSE 115* 117* 110* 85 128*  BUN 35* 38* 42* 39* 29*  CREATININE 2.55* 3.81* 4.09* 3.57* 2.28*  CALCIUM 8.4* 7.8* 7.7* 8.0* 8.1*   Liver Function Tests: No results for input(s): AST, ALT, ALKPHOS, BILITOT, PROT, ALBUMIN in the last 168 hours. No results for input(s): LIPASE, AMYLASE in the last 168 hours. No results for input(s): AMMONIA in the last 168 hours. CBC:  Recent Labs Lab 12/29/16 0210 12/29/16 1440 12/30/16 0241 12/31/16 0530 01/01/17 0934  WBC 12.8* 15.2* 17.3* 12.4* 9.5  HGB 8.1* 7.8* 8.5* 8.0* 8.3*  HCT 24.8* 24.0* 25.7* 24.4* 25.0*  MCV 67.0* 67.0* 66.6* 65.9* 64.9*  PLT 224 208 211 227 240   Cardiac Enzymes:  Recent Labs Lab 12/28/16 2240 12/29/16 0210 12/29/16 1440  TROPONINI 0.05* 0.05* 0.05*   CBG:  Recent Labs Lab 12/29/16 0531  GLUCAP 119*    Iron Studies: No results for input(s): IRON, TIBC, TRANSFERRIN, FERRITIN in the last 72  hours. Studies/Results: Ct Abdomen Pelvis Wo Contrast  Result Date: 01/01/2017 CLINICAL DATA:  Patient admitted for ecoli bacteremia, continues to spike fevers while on antibiotics. Patient has a productive cough, Acute renal insufficiency concern for possible perinephric abscess EXAM: CT ABDOMEN AND PELVIS WITHOUT CONTRAST TECHNIQUE: Multidetector CT imaging of the abdomen and pelvis was performed following the standard protocol without IV contrast. COMPARISON:  05/02/2015 FINDINGS: Lower chest: Patchy airspace opacity is noted in both lower lobes. There are minimal pleural effusions. Heart is normal in size. Hepatobiliary: No focal liver abnormality is seen. Status post cholecystectomy. No biliary dilatation. Pancreas: Unremarkable. No pancreatic ductal dilatation or surrounding inflammatory changes. Spleen: Normal in size without focal abnormality. Adrenals/Urinary Tract: No adrenal masses. Mild dilation of the right intrarenal collecting system. Right kidney appears edematous. Right ureters normal course and caliber with no stones or evidence of obstruction. No left hydronephrosis. No intrarenal stones. No renal masses. Left ureter is normal course and caliber. Bladder is unremarkable. Stomach/Bowel: Status post gastric bypass surgery. No stomach wall thickening or adjacent inflammation. Small bowel is normal in caliber with no wall thickening or adjacent inflammation. Colon is unremarkable. Normal appendix visualized.  Vascular/Lymphatic: No significant vascular findings are present. No enlarged abdominal or pelvic lymph nodes. Reproductive: Uterus and bilateral adnexa are unremarkable. Other: No abdominal wall hernia or abnormality. No abdominopelvic ascites. Musculoskeletal: No fracture or acute finding. No osteoblastic or osteolytic lesions. Significant disc degenerative changes in the lower lumbar spine. IMPRESSION: 1. Patchy consolidation in both lower lobes consistent with pneumonia. Minimal pleural  effusions. 2. Mild right renal collecting system dilation and right renal edema, without a ureteral stone or ureteral dilation. Findings are suspicious for right pyelonephritis. No evidence of a perinephric abscess. 3. No other acute abnormalities. Electronically Signed   By: Lajean Manes M.D.   On: 01/01/2017 13:57   . aspirin EC  81 mg Oral Daily  . buPROPion  150 mg Oral Daily  . cholecalciferol  5,000 Units Oral Daily  . colchicine  0.6 mg Oral Daily  . gabapentin  300 mg Oral QHS  . potassium chloride SA  40 mEq Oral BID  . Warfarin - Pharmacist Dosing Inpatient   Does not apply q1800    BMET    Component Value Date/Time   NA 134 (L) 01/01/2017 0934   K 3.2 (L) 01/01/2017 0934   CL 111 01/01/2017 0934   CO2 15 (L) 01/01/2017 0934   GLUCOSE 128 (H) 01/01/2017 0934   BUN 29 (H) 01/01/2017 0934   CREATININE 2.28 (H) 01/01/2017 0934   CALCIUM 8.1 (L) 01/01/2017 0934   GFRNONAA 25 (L) 01/01/2017 0934   GFRAA 29 (L) 01/01/2017 0934   CBC    Component Value Date/Time   WBC 9.5 01/01/2017 0934   RBC 3.85 (L) 01/01/2017 0934   HGB 8.3 (L) 01/01/2017 0934   HCT 25.0 (L) 01/01/2017 0934   PLT 240 01/01/2017 0934   MCV 64.9 (L) 01/01/2017 0934   MCH 21.6 (L) 01/01/2017 0934   MCHC 33.2 01/01/2017 0934   RDW 18.6 (H) 01/01/2017 0934   LYMPHSABS 2.1 10/01/2016 2335   MONOABS 0.5 10/01/2016 2335   EOSABS 0.1 10/01/2016 2335   BASOSABS 0.0 10/01/2016 2335    Assessment/Plan: 1. AKI/CKD- in setting of urosepsis with hypotension, hypovolemia, and NSAIDs. Currently non-oliguric.  1. Scr improving. 2. Continue with IVF's and antibiotics.  3. No indication for dialysis at this time.  4. Avoid NSAIDs and Cox-II I's in future 2. Enterobacter/E. Coli urosepsis/ Right pyelonephritis- on Rocephin IV, continue with IVF's. Still febrile and BP's remain low.  Per primary 3. Metabolic acidosis- will change IVF's to isotonic bicarb and follow. 4. Atypical chest pain, negative VQ  scan 5. OSA- CPAP per primary 6. Morbid obesity 7. Iron deficient anemia- will need to r/o GIB given her NSAID use and coumadin. Transfuse prn. Will give IV Iron once she is more hemodynamically stable.  8. Venous stasis ulcer of right leg 9. Hypokalemia - replete po and follow. 10. Elevated troponin and new cardiomyopathy- EF 45-50% with diffuse hypokinesis. Cardiology following.   Donetta Potts, MD [employer] [phone]

## 2017-01-01 NOTE — Progress Notes (Signed)
ANTICOAGULATION CONSULT NOTE - Follow Up  Pharmacy Consult:  Coumadin Indication:  History of PE  Allergies  Allergen Reactions  . Oxycodone Hcl Nausea Only  . Vicodin [Hydrocodone-Acetaminophen] Nausea And Vomiting    Patient Measurements: Height: 5\' 8"  (172.7 cm) Weight: (!) 331 lb 2.1 oz (150.2 kg) IBW/kg (Calculated) : 63.9  Vital Signs: Temp: 100.3 F (37.9 C) (08/19 0813) Temp Source: Oral (08/19 0813) BP: 102/66 (08/19 0813) Pulse Rate: 99 (08/19 0031)  Labs:  Recent Labs  12/29/16 1440 12/30/16 0241 12/31/16 0530 01/01/17 0934  HGB 7.8* 8.5* 8.0* 8.3*  HCT 24.0* 25.7* 24.4* 25.0*  PLT 208 211 227 240  LABPROT 33.7* 42.7* 58.2* 53.5*  INR 3.23 4.33* 6.39* 5.75*  CREATININE 3.81* 4.09* 3.57*  --   TROPONINI 0.05*  --   --   --     Estimated Creatinine Clearance: 31.2 mL/min (A) (by C-G formula based on SCr of 3.57 mg/dL (H)).   Assessment: 46 y.o. female presented with dyspnea and chest pain, taking Coumadin PTA for history of PE. INR decreased to 5.75 today after warfarin hold x2. Last dose warfarin 12/29/2016. Continues on CTX, which may be contributing to elevated INR. Pt with moderate PO intake. CBC low but stable. No bleeding reported.  PTA warfarin dose: 12.5mg  PO daily Admit INR: 2.96  Goal of Therapy:  INR 2-3 Monitor platelets by anticoagulation protocol: Yes   Plan:  Hold Coumadin today Daily INR Monitor for s/sx bleeding   Erin N. Gerarda Fraction, PharmD PGY1 Pharmacy Resident Pager: [phone]

## 2017-01-01 NOTE — Progress Notes (Signed)
Progress Note  Patient Name: Alice [name] Date of Encounter: 01/01/2017  Primary Cardiologist: Kathrin Ruddy   Subjective   46 year old female with a history of DVT, pulmonary list, morbid obesity, hypertension was admitted profound sepsis syndrome and with pleuritic chest pain.  She has acute renal insufficiency with a creatinine of 4.09. Pro-calcitonin level is 42.89  -> 21.09. Troponin = 0.05 with a flat trend - not suggestive of ACS .  Has a cough with pleuretic CP   Inpatient Medications    Scheduled Meds: . aspirin EC  81 mg Oral Daily  . buPROPion  150 mg Oral Daily  . cholecalciferol  5,000 Units Oral Daily  . colchicine  0.6 mg Oral Daily  . gabapentin  300 mg Oral QHS  . potassium chloride SA  40 mEq Oral BID  . Warfarin - Pharmacist Dosing Inpatient   Does not apply q1800   Continuous Infusions: . sodium chloride 125 mL/hr at 01/01/17 0421  . cefTRIAXone (ROCEPHIN)  IV Stopped (12/31/16 0947)   PRN Meds: acetaminophen, albuterol, HYDROmorphone (DILAUDID) injection, nitroGLYCERIN, ondansetron (ZOFRAN) IV, oxyCODONE   Vital Signs    Vitals:   01/01/17 0031 01/01/17 0109 01/01/17 0435 01/01/17 0813  BP: 114/65   102/66  Pulse: 99     Resp: (!) 24     Temp: (!) 100.8 F (38.2 C) 100 F (37.8 C) 100.3 F (37.9 C) 100.3 F (37.9 C)  TempSrc: Oral Oral Oral Oral  SpO2: 95%   92%  Weight:   (!) 331 lb 2.1 oz (150.2 kg)   Height:        Intake/Output Summary (Last 24 hours) at 01/01/17 0930 Last data filed at 01/01/17 0500  Gross per 24 hour  Intake             1725 ml  Output             1225 ml  Net              500 ml   Filed Weights   12/30/16 0401 12/31/16 0404 01/01/17 0435  Weight: (!) 322 lb 1.5 oz (146.1 kg) (!) 335 lb 4.8 oz (152.1 kg) (!) 331 lb 2.1 oz (150.2 kg)    Telemetry    Sinus tach 107 - Personally Reviewed  ECG     NSR no ST or T wave abn.  - Personally Reviewed  Physical Exam   GEN: Middle-aged female. She  appears to  be mildly uncomfortable. She is in no acute distress. Neck: No JVD Cardiac: RRR, no murmurs, no  rubs, or gallops.  Respiratory: Clear to auscultation bilaterally. GI: Soft, nontender, non-distended  MS: No edema; No deformity. Neuro:  Nonfocal  Psych: Normal affect   Labs    Chemistry  Recent Labs Lab 12/29/16 1440 12/30/16 0241 12/31/16 0530  NA 137 134* 137  K 3.1* 3.2* 3.6  CL 108 107 111  CO2 21* 18* 15*  GLUCOSE 117* 110* 85  BUN 38* 42* 39*  CREATININE 3.81* 4.09* 3.57*  CALCIUM 7.8* 7.7* 8.0*  GFRNONAA 13* 12* 14*  GFRAA 16* 14* 17*  ANIONGAP 8 9 11      Hematology  Recent Labs Lab 12/29/16 1440 12/30/16 0241 12/31/16 0530  WBC 15.2* 17.3* 12.4*  RBC 3.58* 3.86* 3.70*  HGB 7.8* 8.5* 8.0*  HCT 24.0* 25.7* 24.4*  MCV 67.0* 66.6* 65.9*  MCH 21.8* 22.0* 21.6*  MCHC 32.5 33.1 32.8  RDW 18.0* 18.1* 18.5*  PLT 208 211  227    Cardiac Enzymes  Recent Labs Lab 12/28/16 2240 12/29/16 0210 12/29/16 1440  TROPONINI 0.05* 0.05* 0.05*     Recent Labs Lab 12/28/16 1709  TROPIPOC 0.01     BNP  Recent Labs Lab 12/28/16 1704  BNP 58.3     DDimer   Recent Labs Lab 12/28/16 1704  DDIMER 1.04*     Radiology    No results found.  Cardiac Studies     Patient Profile     46 y.o. female admitted with sepsis syndrome. She was found to have minimally elevated troponin levels but that this is in the setting of acute renal failure and sepsis syndrome.  Assessment & Plan    1. Question of pericarditis: Do not think that the patient has pericarditis. There is no EKG evidence of pericarditis. She has sepsis syndrome and a trivial elevation in troponin levels. This elevation in troponin is likely due to her acute renal insufficiency with a creatinine of 4 and the setting of profound sepsis.  Seems to be be getting better   Her pleuritic chest pain is more likely to be pleuritis and not pericarditis. We'll stop the colchicine and observe. As her  creatinine improves, we can consider restarting the culture seen if needed.  2.   Mild acute systolic congestive heart failure: I suspect that this is likely due to her sepsis syndrome. She's not had any episodes of angina and her troponin levels are more reflective of sepsis syndrome in the setting of acute renal insufficiency.  Dr. Meda Coffee has tentatively recommended coronary CTA once the patient has recovered.    Signed, Mertie Moores, MD  01/01/2017, 9:30 AM

## 2017-01-01 NOTE — Progress Notes (Signed)
Report received in patient's room via Rey RN using [employer], reviewed VS, some testing and PMH and patient's general condition, assumed care of patient.

## 2017-01-01 NOTE — Progress Notes (Signed)
Received Critical INR level from lab at 5.75. Prior INR at 6.39. Pt MD aware of prior INR 6.39.

## 2017-01-01 NOTE — Progress Notes (Signed)
[facility]                                                                              Patient Demographics  Alice [name], is a 46 y.o. female, DOB - 1970-08-14, HER:[id number]  Admit date - 12/28/2016   Admitting Physician Vianne Bulls, MD  Outpatient Primary MD for the patient is No primary care provider on file.  Outpatient specialists:   LOS - 4  days   Medical records reviewed and are as summarized below:    Chief Complaint  Patient presents with  . Back Pain  . Chest Pain  . Shortness of Breath       Brief summary   Patient is a 46 year old female with history of DVT, PE on warfarin, chronic back pain, hypertension, OSA on CPAP intolerance, presented to ED with chest pain, shortness of breath. Patient reported the pain as sharp, severe, constant on both side of the chest with dyspnea. Chest x-ray showed no pneumonia. VQ scan negative for PE. Troponin +0.05. Patient was admitted to stepdown unit. UA positive for UTI.   Assessment & Plan    Principal Problem:   Sepsis ([place]) - Escherichia coli bacteremia, Escherichia coli UTI -Patient met sepsis criteria for 103, tachycardia 104, hypotension 97/51 UA positive for UTI, also being treated for venous ulcer on the right lower extremity  -  Appreciate Dr. Sharol Given for evaluating the venous ulcer on right lower extremity-> clean - Placed on 2 g IV Rocephin, continue IV fluids - Repeat blood cultures 8/17, Negative so far -Still spiking fevers, ID consult was obtained, recommended repeat imaging, ordered CT abdomen today   Active Problems: Acute UTI ([place]) -Urine cultures Escherichia coli, continue IV Rocephin  Atypical chest pain, new cardiomyopathy, elevated troponin - Unclear in etiology, patient reports constant chest pain with shortness of breath, worsens with inspiration or positional changes -  Given history of DVT, PE, VQ scan was obtained which was negative for pulmonary embolism - Cardiology  following, per Dr. Meda Coffee likely has presumed pericarditis, recommended colchicine, plan on stress test or coronary CTA once recovered from acute illness    Acute kidney injury superimposed on CKD, stage III (HCC) - Serum creatinine 2.5 on admission, baseline 1.1 - Possible prerenal, dehydration and recent vomiting, severe sepsis medications HCTZ prior to admission - Creatinine worsened to 4.09, plateaued -creatinine improving to 2.28 today, Decrease IV fluids?     hypotension  - Likely due to #1 sepsis, has a underlying history of hypertension - BP now improving however continue to hold HCTZ, amlodipine, on IV fluid hydration     History of pulmonary embolism. History of DVT (deep vein thrombosis) - Continue Coumadin per pharmacy, INR supratherapeutic possibly due to sepsis - Repeat VQ scan showed no pulmonary embolism    OSA (obstructive sleep apnea) - Continue CPAP  Hypokalemia -Continue scheduled potassium replacement    Microcytic anemia - Fe 7, saturation ratio 2, suggesting microcytic anemia however patient has B12 deficiency as well. 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 Sharol Given following  Obesity Morbid, BMI 51.8 Patient recommended diet and weight control  Code Status: full  DVT Prophylaxis:  Warfarin on HOLD Family Communication: Discussed in detail with the patient, all imaging results, lab results explained to the patient   Disposition Plan: in SDU  Time Spent in minutes   25 minutes  Procedures:  Renal ultrasound  Consultants:   Cardiology   Dr. Sharol Given Infectious disease, Dr. Johnnye Sima  Antimicrobials:   IV Rocephin 8/16   Medications  Scheduled Meds: . aspirin EC  81 mg Oral Daily  . buPROPion  150 mg Oral Daily  . cholecalciferol  5,000 Units Oral Daily  . colchicine  0.6 mg Oral Daily  . gabapentin  300 mg Oral QHS  . potassium chloride SA  40 mEq Oral BID  . Warfarin - Pharmacist  Dosing Inpatient   Does not apply q1800   Continuous Infusions: . sodium chloride 125 mL/hr at 01/01/17 0421  . cefTRIAXone (ROCEPHIN)  IV Stopped (01/01/17 1030)   PRN Meds:.acetaminophen, albuterol, HYDROmorphone (DILAUDID) injection, nitroGLYCERIN, ondansetron (ZOFRAN) IV, oxyCODONE   Antibiotics   Anti-infectives    Start     Dose/Rate Route Frequency Ordered Stop   12/30/16 1000  cefTRIAXone (ROCEPHIN) 2 g in dextrose 5 % 50 mL IVPB     2 g 100 mL/hr over 30 Minutes Intravenous Every 24 hours 12/30/16 0645     12/29/16 1400  cefTRIAXone (ROCEPHIN) 1 g in dextrose 5 % 50 mL IVPB  Status:  Discontinued     1 g 100 mL/hr over 30 Minutes Intravenous Every 24 hours 12/29/16 1315 12/30/16 0645        Subjective:   Alice [name] was seen and examined today. Still spiking low-grade fevers overnight 100.15F. No chest pain, shortness of breath, no nausea vomiting. Still feels weak now significant abdominal pain. No issues overnight.   Objective:   Vitals:   01/01/17 0109 01/01/17 0435 01/01/17 0813 01/01/17 1216  BP:   102/66 121/87  Pulse:      Resp:      Temp: 100 F (37.8 C) 100.3 F (37.9 C) 100.3 F (37.9 C) 99.9 F (37.7 C)  TempSrc: Oral Oral Oral Oral  SpO2:   92% 91%  Weight:  (!) 150.2 kg (331 lb 2.1 oz)    Height:        Intake/Output Summary (Last 24 hours) at 01/01/17 1315 Last data filed at 01/01/17 0500  Gross per 24 hour  Intake             1725 ml  Output              975 ml  Net              750 ml     Wt Readings from Last 3 Encounters:  01/01/17 (!) 150.2 kg (331 lb 2.1 oz)  12/22/16 (!) 154.2 kg (340 lb)  12/19/16 (!) 154.2 kg (340 lb)     Exam   General: Alert and oriented x 3, NAD  Eyes:   HEENT:  Cardiovascular: S1 S2 auscultated, no rubs, murmurs or gallops. Regular rate and rhythm. No pedal edema b/l  Respiratory: Clear to auscultation bilaterally, no wheezing, rales or rhonchi  Gastrointestinal: Obese Soft, nontender,  nondistended, + bowel sounds  Ext: no pedal edema bilaterally  Neuro: no new deficits  Musculoskeletal: No digital cyanosis, clubbing  Skin: No rashes  Psych: Normal affect and demeanor, alert and oriented x3     Data Reviewed:  I have personally reviewed following labs and imaging studies  Micro Results Recent Results (from the past 240 hour(s))  MRSA PCR Screening     Status: None   Collection Time: 12/29/16  5:32 AM  Result Value Ref Range Status   MRSA by PCR NEGATIVE NEGATIVE Final    Comment:        The GeneXpert MRSA Assay (FDA approved for NASAL specimens only), is one component of a comprehensive MRSA colonization surveillance program. It is not intended to diagnose MRSA infection nor to guide or monitor treatment for MRSA infections.   Culture, blood (routine x 2)     Status: Abnormal   Collection Time: 12/29/16  2:40 PM  Result Value Ref Range Status   Specimen Description BLOOD LEFT HAND  Final   Special Requests IN PEDIATRIC BOTTLE Blood Culture adequate volume  Final   Culture  Setup Time   Final    GRAM NEGATIVE RODS IN PEDIATRIC BOTTLE CRITICAL RESULT CALLED TO, READ BACK BY AND VERIFIED WITH: J.LEDFORD, PHARMD 12/30/16 0634 L.CHAMPION    Culture ESCHERICHIA COLI (A)  Final   Report Status 01/01/2017 FINAL  Final   Organism ID, Bacteria ESCHERICHIA COLI  Final      Susceptibility   Escherichia coli - MIC*    AMPICILLIN >=32 RESISTANT Resistant     CEFAZOLIN <=4 SENSITIVE Sensitive     CEFEPIME <=1 SENSITIVE Sensitive     CEFTAZIDIME <=1 SENSITIVE Sensitive     CEFTRIAXONE <=1 SENSITIVE Sensitive     CIPROFLOXACIN <=0.25 SENSITIVE Sensitive     GENTAMICIN <=1 SENSITIVE Sensitive     IMIPENEM <=0.25 SENSITIVE Sensitive     TRIMETH/SULFA >=320 RESISTANT Resistant     AMPICILLIN/SULBACTAM >=32 RESISTANT Resistant     PIP/TAZO <=4 SENSITIVE Sensitive     Extended ESBL NEGATIVE Sensitive     * ESCHERICHIA COLI  Culture, blood (routine x 2)      Status: Abnormal   Collection Time: 12/29/16  2:40 PM  Result Value Ref Range Status   Specimen Description BLOOD LEFT ARM  Final   Special Requests   Final    BOTTLES DRAWN AEROBIC ONLY Blood Culture adequate volume   Culture  Setup Time   Final    GRAM NEGATIVE RODS AEROBIC BOTTLE ONLY CRITICAL VALUE NOTED.  VALUE IS CONSISTENT WITH PREVIOUSLY REPORTED AND CALLED VALUE.    Culture (A)  Final    ESCHERICHIA COLI SUSCEPTIBILITIES PERFORMED ON PREVIOUS CULTURE WITHIN THE LAST 5 DAYS.    Report Status 01/01/2017 FINAL  Final  Blood Culture ID Panel (Reflexed)     Status: Abnormal   Collection Time: 12/29/16  2:40 PM  Result Value Ref Range Status   Enterococcus species NOT DETECTED NOT DETECTED Final   Vancomycin resistance NOT DETECTED NOT DETECTED Final   Listeria monocytogenes NOT DETECTED NOT DETECTED Final   Staphylococcus species NOT DETECTED NOT DETECTED Final   Staphylococcus aureus NOT DETECTED NOT DETECTED Final   Methicillin resistance NOT DETECTED NOT DETECTED Final   Streptococcus species NOT DETECTED NOT DETECTED Final   Streptococcus agalactiae NOT DETECTED NOT DETECTED Final   Streptococcus pneumoniae NOT DETECTED NOT DETECTED Final   Streptococcus pyogenes NOT DETECTED NOT DETECTED Final   Acinetobacter baumannii NOT DETECTED NOT DETECTED Final   Enterobacteriaceae species DETECTED (A) NOT DETECTED Final    Comment: CRITICAL RESULT CALLED TO, READ BACK BY AND VERIFIED WITH: J.LEDFORD, PHARMD 12/30/16 0634 L.CHAMPION    Enterobacter cloacae complex NOT DETECTED NOT DETECTED  Final   Escherichia coli DETECTED (A) NOT DETECTED Final    Comment: CRITICAL RESULT CALLED TO, READ BACK BY AND VERIFIED WITH: J.LEDFORD, PHARMD 12/30/16 0634 L.CHAMPION    Klebsiella oxytoca NOT DETECTED NOT DETECTED Final   Klebsiella pneumoniae NOT DETECTED NOT DETECTED Final   Proteus species NOT DETECTED NOT DETECTED Final   Serratia marcescens NOT DETECTED NOT DETECTED Final    Carbapenem resistance NOT DETECTED NOT DETECTED Final   Haemophilus influenzae NOT DETECTED NOT DETECTED Final   Neisseria meningitidis NOT DETECTED NOT DETECTED Final   Pseudomonas aeruginosa NOT DETECTED NOT DETECTED Final   Candida albicans NOT DETECTED NOT DETECTED Final   Candida glabrata NOT DETECTED NOT DETECTED Final   Candida krusei NOT DETECTED NOT DETECTED Final   Candida parapsilosis NOT DETECTED NOT DETECTED Final   Candida tropicalis NOT DETECTED NOT DETECTED Final  Urine Culture     Status: Abnormal   Collection Time: 12/29/16  3:23 PM  Result Value Ref Range Status   Specimen Description URINE, CLEAN CATCH  Final   Special Requests NONE  Final   Culture >=100,000 COLONIES/mL ESCHERICHIA COLI (A)  Final   Report Status 12/31/2016 FINAL  Final   Organism ID, Bacteria ESCHERICHIA COLI (A)  Final      Susceptibility   Escherichia coli - MIC*    AMPICILLIN >=32 RESISTANT Resistant     CEFAZOLIN <=4 SENSITIVE Sensitive     CEFTRIAXONE <=1 SENSITIVE Sensitive     CIPROFLOXACIN <=0.25 SENSITIVE Sensitive     GENTAMICIN <=1 SENSITIVE Sensitive     IMIPENEM <=0.25 SENSITIVE Sensitive     NITROFURANTOIN <=16 SENSITIVE Sensitive     TRIMETH/SULFA >=320 RESISTANT Resistant     AMPICILLIN/SULBACTAM 16 INTERMEDIATE Intermediate     PIP/TAZO <=4 SENSITIVE Sensitive     Extended ESBL NEGATIVE Sensitive     * >=100,000 COLONIES/mL ESCHERICHIA COLI  Culture, blood (routine x 2)     Status: None (Preliminary result)   Collection Time: 12/30/16  7:10 PM  Result Value Ref Range Status   Specimen Description BLOOD LEFT HAND  Final   Special Requests IN PEDIATRIC BOTTLE Blood Culture adequate volume  Final   Culture NO GROWTH < 24 HOURS  Final   Report Status PENDING  Incomplete  Culture, blood (routine x 2)     Status: None (Preliminary result)   Collection Time: 12/30/16  7:10 PM  Result Value Ref Range Status   Specimen Description BLOOD RIGHT HAND  Final   Special Requests    Final    BOTTLES DRAWN AEROBIC ONLY Blood Culture adequate volume   Culture NO GROWTH < 24 HOURS  Final   Report Status PENDING  Incomplete    Radiology Reports US Renal  Result Date: 12/28/2016 CLINICAL DATA:  Acute kidney injury, pain in chills for 2 days EXAM: RENAL / URINARY TRACT ULTRASOUND COMPLETE COMPARISON:  05/02/2015, 05/17/2013 FINDINGS: Right Kidney: Length: 10.9 cm. Echogenicity within normal limits. No mass or hydronephrosis visualized. Left Kidney: Length: 11.7 cm. Echogenicity within normal limits. No mass or hydronephrosis visualized. Bladder: Appears normal for degree of bladder distention. IMPRESSION: Negative renal ultrasound Electronically Signed   By: Donavan Foil M.D.   On: 12/28/2016 22:05   Nm Pulmonary Vent And Perf (v/q Scan)  Result Date: 12/28/2016 CLINICAL DATA:  Chest pain and shortness of Breath EXAM: NUCLEAR MEDICINE VENTILATION - PERFUSION LUNG SCAN TECHNIQUE: Ventilation images were obtained in multiple projections using inhaled aerosol Tc-56mDTPA. Perfusion images were obtained in  multiple projections after intravenous injection of Tc-34mMAA. RADIOPHARMACEUTICALS:  31.6 mCi Technetium-955mTPA aerosol inhalation and 4.24 mCi Technetium-9978mA IV COMPARISON:  Chest x-ray from earlier in the same day. FINDINGS: Ventilation: No focal ventilation defect. Perfusion: Lateral imaging could not be performed due the patient's size and cold for although the remaining perfusion images show no significant perfusion defect. IMPRESSION: No evidence of pulmonary embolism. Electronically Signed   By: MarInez CatalinaD.   On: 12/28/2016 19:40   Dg Chest Portable 1 View  Result Date: 12/28/2016 CLINICAL DATA:  Shortness of breath and chest pain EXAM: PORTABLE CHEST 1 VIEW COMPARISON:  April 19, 2015 FINDINGS: Lungs are clear. Heart is borderline enlarged with pulmonary vascularity within normal limits. No adenopathy. No pneumothorax. No bone lesions. IMPRESSION: Borderline  cardiac enlargement.  No edema or consolidation. Electronically Signed   By: WilLowella GripI M.D.   On: 12/28/2016 17:09    Lab Data:  CBC:  Recent Labs Lab 12/29/16 0210 12/29/16 1440 12/30/16 0241 12/31/16 0530 01/01/17 0934  WBC 12.8* 15.2* 17.3* 12.4* 9.5  HGB 8.1* 7.8* 8.5* 8.0* 8.3*  HCT 24.8* 24.0* 25.7* 24.4* 25.0*  MCV 67.0* 67.0* 66.6* 65.9* 64.9*  PLT 224 208 211 227 [id number] Metabolic Panel:  Recent Labs Lab 12/28/16 1704 12/29/16 1440 12/30/16 0241 12/31/16 0530 01/01/17 0934  NA 137 137 134* 137 134*  K 2.5* 3.1* 3.2* 3.6 3.2*  CL 107 108 107 111 111  CO2 20* 21* 18* 15* 15*  GLUCOSE 115* 117* 110* 85 128*  BUN 35* 38* 42* 39* 29*  CREATININE 2.55* 3.81* 4.09* 3.57* 2.28*  CALCIUM 8.4* 7.8* 7.7* 8.0* 8.1*  MG 1.9 1.8  --   --   --    GFR: Estimated Creatinine Clearance: 48.9 mL/min (A) (by C-G formula based on SCr of 2.28 mg/dL (H)). Liver Function Tests: No results for input(s): AST, ALT, ALKPHOS, BILITOT, PROT, ALBUMIN in the last 168 hours. No results for input(s): LIPASE, AMYLASE in the last 168 hours. No results for input(s): AMMONIA in the last 168 hours. Coagulation Profile:  Recent Labs Lab 12/29/16 0210 12/29/16 1440 12/30/16 0241 12/31/16 0530 01/01/17 0934  INR 2.97 3.23 4.33* 6.39* 5.75*   Cardiac Enzymes:  Recent Labs Lab 12/28/16 2240 12/29/16 0210 12/29/16 1440  TROPONINI 0.05* 0.05* 0.05*   BNP (last 3 results) No results for input(s): PROBNP in the last 8760 hours. HbA1C: No results for input(s): HGBA1C in the last 72 hours. CBG:  Recent Labs Lab 12/29/16 0531  GLUCAP 119*   Lipid Profile: No results for input(s): CHOL, HDL, LDLCALC, TRIG, CHOLHDL, LDLDIRECT in the last 72 hours. Thyroid Function Tests: No results for input(s): TSH, T4TOTAL, FREET4, T3FREE, THYROIDAB in the last 72 hours. Anemia Panel: No results for input(s): VITAMINB12, FOLATE, FERRITIN, TIBC, IRON, RETICCTPCT in the last 72  hours. Urine analysis:    Component Value Date/Time   COLORURINE YELLOW 12/28/2016 2200   APPEARANCEUR CLOUDY (A) 12/28/2016 2200   LABSPEC 1.012 12/28/2016 2200   PHURINE 5.0 12/28/2016 2200   GLUCOSEU NEGATIVE 12/28/2016 2200   HGBUR LARGE (A) 12/28/2016 2200   BILIRUBINUR NEGATIVE 12/28/2016 2200   KETONESUR NEGATIVE 12/28/2016 2200   PROTEINUR 100 (A) 12/28/2016 2200   UROBILINOGEN 4.0 (H) 11/25/2013 1511   NITRITE POSITIVE (A) 12/28/2016 2200   LEUKOCYTESUR LARGE (A) 12/28/2016 2200     Lam Bjorklund M.D. [facility] 01/01/2017, 1:15 PM  Pager: [phone] 7am to 7pm - call  Pager - [phone]  After 7pm go to [email or web address] - password [email or web address]  Call night coverage person covering after 7pm

## 2017-01-02 DIAGNOSIS — R0789 Other chest pain: Secondary | ICD-10-CM

## 2017-01-02 DIAGNOSIS — R51 Headache: Secondary | ICD-10-CM

## 2017-01-02 DIAGNOSIS — Z885 Allergy status to narcotic agent status: Secondary | ICD-10-CM

## 2017-01-02 DIAGNOSIS — M545 Low back pain: Secondary | ICD-10-CM

## 2017-01-02 DIAGNOSIS — R112 Nausea with vomiting, unspecified: Secondary | ICD-10-CM

## 2017-01-02 DIAGNOSIS — R339 Retention of urine, unspecified: Secondary | ICD-10-CM

## 2017-01-02 LAB — PROTIME-INR
INR: 4.97 — AB
PROTHROMBIN TIME: 47.6 s — AB (ref 11.4–15.2)

## 2017-01-02 LAB — CBC
HEMATOCRIT: 20.6 % — AB (ref 36.0–46.0)
HEMOGLOBIN: 7 g/dL — AB (ref 12.0–15.0)
MCH: 21.7 pg — ABNORMAL LOW (ref 26.0–34.0)
MCHC: 34 g/dL (ref 30.0–36.0)
MCV: 63.8 fL — ABNORMAL LOW (ref 78.0–100.0)
Platelets: 253 10*3/uL (ref 150–400)
RBC: 3.23 MIL/uL — ABNORMAL LOW (ref 3.87–5.11)
RDW: 18.2 % — AB (ref 11.5–15.5)
WBC: 13 10*3/uL — AB (ref 4.0–10.5)

## 2017-01-02 LAB — PROCALCITONIN: PROCALCITONIN: 4.65 ng/mL

## 2017-01-02 LAB — RENAL FUNCTION PANEL
Albumin: 2.1 g/dL — ABNORMAL LOW (ref 3.5–5.0)
Anion gap: 9 (ref 5–15)
BUN: 20 mg/dL (ref 6–20)
CALCIUM: 8 mg/dL — AB (ref 8.9–10.3)
CHLORIDE: 109 mmol/L (ref 101–111)
CO2: 20 mmol/L — AB (ref 22–32)
CREATININE: 1.62 mg/dL — AB (ref 0.44–1.00)
GFR, EST AFRICAN AMERICAN: 44 mL/min — AB (ref >60)
GFR, EST NON AFRICAN AMERICAN: 38 mL/min — AB (ref >60)
Glucose, Bld: 88 mg/dL (ref 65–99)
Phosphorus: 1.9 mg/dL — ABNORMAL LOW (ref 2.5–4.6)
Potassium: 3.4 mmol/L — ABNORMAL LOW (ref 3.5–5.1)
SODIUM: 138 mmol/L (ref 135–145)

## 2017-01-02 LAB — GLUCOSE, CAPILLARY: GLUCOSE-CAPILLARY: 119 mg/dL — AB (ref 65–99)

## 2017-01-02 LAB — PREPARE RBC (CROSSMATCH)

## 2017-01-02 MED ORDER — CEPHALEXIN 500 MG PO CAPS
500.0000 mg | ORAL_CAPSULE | Freq: Two times a day (BID) | ORAL | Status: DC
  Administered 2017-01-02 – 2017-01-03 (×3): 500 mg via ORAL
  Filled 2017-01-02 (×3): qty 1

## 2017-01-02 MED ORDER — ACETAMINOPHEN 325 MG PO TABS: 650.0000 mg | ORAL_TABLET | ORAL | Status: DC | PRN

## 2017-01-02 MED ORDER — SODIUM BICARBONATE 650 MG PO TABS
1300.0000 mg | ORAL_TABLET | Freq: Two times a day (BID) | ORAL | Status: DC
  Administered 2017-01-02 – 2017-01-03 (×3): 1300 mg via ORAL
  Filled 2017-01-02 (×3): qty 2

## 2017-01-02 MED ORDER — ACETAMINOPHEN 325 MG PO TABS
650.0000 mg | ORAL_TABLET | ORAL | Status: DC | PRN
  Administered 2017-01-02 – 2017-01-03 (×2): 650 mg via ORAL
  Filled 2017-01-02 (×2): qty 2

## 2017-01-02 MED ORDER — POTASSIUM CHLORIDE CRYS ER 20 MEQ PO TBCR
40.0000 meq | EXTENDED_RELEASE_TABLET | Freq: Two times a day (BID) | ORAL | Status: DC
  Administered 2017-01-02 – 2017-01-03 (×2): 40 meq via ORAL
  Filled 2017-01-02 (×2): qty 2

## 2017-01-02 MED ORDER — SODIUM CHLORIDE 0.9 % IV SOLN
Freq: Once | INTRAVENOUS | Status: AC
  Administered 2017-01-02: 09:00:00 via INTRAVENOUS

## 2017-01-02 MED ORDER — FLUCONAZOLE 100 MG PO TABS
100.0000 mg | ORAL_TABLET | Freq: Every day | ORAL | Status: DC
  Filled 2017-01-02: qty 1

## 2017-01-02 NOTE — Care Management Important Message (Signed)
Important Message  Patient Details  Name: Alice [name] MRN: [id number] Date of Birth: [birth year]   Medicare Important Message Given:  Yes    [name] [name] 01/02/2017, 11:23 AM

## 2017-01-02 NOTE — Progress Notes (Signed)
Progress Note  Patient Name: Alice [name] Date of Encounter: 01/02/2017  Primary Cardiologist: Kathrin Ruddy   Subjective   Pt continues to slowly feel better. She has very mild chest pain with cough, much better. Cotninues to have shortness of breath with getting out of bed to chair.   Inpatient Medications    Scheduled Meds: . aspirin EC  81 mg Oral Daily  . buPROPion  150 mg Oral Daily  . cholecalciferol  5,000 Units Oral Daily  . colchicine  0.6 mg Oral Daily  . gabapentin  300 mg Oral QHS  . nystatin  5 mL Oral QID  . potassium chloride SA  40 mEq Oral TID  . Warfarin - Pharmacist Dosing Inpatient   Does not apply q1800   Continuous Infusions: . sodium chloride    . cefTRIAXone (ROCEPHIN)  IV 2 g (01/02/17 0948)  .  sodium bicarbonate (isotonic) infusion in sterile water 150 mEq (01/01/17 2215)   PRN Meds: acetaminophen, albuterol, HYDROmorphone (DILAUDID) injection, lip balm, nitroGLYCERIN, ondansetron (ZOFRAN) IV, oxyCODONE   Vital Signs    Vitals:   01/01/17 1555 01/01/17 2007 01/02/17 0245 01/02/17 0700  BP: 133/81 116/82 131/83   Pulse:   95   Resp:  (!) 32 (!) 28   Temp: 100 F (37.8 C) 99.3 F (37.4 C) 99 F (37.2 C) 99.2 F (37.3 C)  TempSrc: Oral   Oral  SpO2: 90% 93% 95%   Weight:   (!) 331 lb 4.8 oz (150.3 kg)   Height:        Intake/Output Summary (Last 24 hours) at 01/02/17 1045 Last data filed at 01/02/17 0900  Gross per 24 hour  Intake             2235 ml  Output             1350 ml  Net              885 ml   Filed Weights   12/31/16 0404 01/01/17 0435 01/02/17 0245  Weight: (!) 335 lb 4.8 oz (152.1 kg) (!) 331 lb 2.1 oz (150.2 kg) (!) 331 lb 4.8 oz (150.3 kg)    Telemetry    Sinus rhythm in the 90's to low 100's - Personally Reviewed  ECG    No new tracings.   - Personally Reviewed  Physical Exam  Physical Exam  Constitutional: She is oriented to person, place, and time. She appears well-developed and well-nourished. No  distress.  Still appears mildly uncomfortable especially with cough.   HENT:  Head: Normocephalic and atraumatic.  Neck: Normal range of motion. No JVD present.  Cardiovascular: Normal rate, regular rhythm and normal heart sounds.   Pulmonary/Chest: Effort normal and breath sounds normal.  Coughing with deep breathing during exam. Non-productive.   Abdominal: Soft. She exhibits no distension.  Musculoskeletal: Normal range of motion. She exhibits no edema.  Neurological: She is alert and oriented to person, place, and time.  Skin: Skin is warm and dry.  Psychiatric: She has a normal mood and affect. Her behavior is normal.  Vitals reviewed.   Labs    Chemistry  Recent Labs Lab 12/31/16 0530 01/01/17 0934 01/02/17 0520  NA 137 134* 138  K 3.6 3.2* 3.4*  CL 111 111 109  CO2 15* 15* 20*  GLUCOSE 85 128* 88  BUN 39* 29* 20  CREATININE 3.57* 2.28* 1.62*  CALCIUM 8.0* 8.1* 8.0*  ALBUMIN  --   --  2.1*  GFRNONAA 14* 25* 38*  GFRAA 17* 29* 44*  ANIONGAP 11 8 9      Hematology  Recent Labs Lab 12/31/16 0530 01/01/17 0934 01/02/17 0520  WBC 12.4* 9.5 13.0*  RBC 3.70* 3.85* 3.23*  HGB 8.0* 8.3* 7.0*  HCT 24.4* 25.0* 20.6*  MCV 65.9* 64.9* 63.8*  MCH 21.6* 21.6* 21.7*  MCHC 32.8 33.2 34.0  RDW 18.5* 18.6* 18.2*  PLT 227 240 253    Cardiac Enzymes  Recent Labs Lab 12/28/16 2240 12/29/16 0210 12/29/16 1440  TROPONINI 0.05* 0.05* 0.05*     Recent Labs Lab 12/28/16 1709  TROPIPOC 0.01     BNP  Recent Labs Lab 12/28/16 1704  BNP 58.3     DDimer   Recent Labs Lab 12/28/16 1704  DDIMER 1.04*     Radiology    Ct Abdomen Pelvis Wo Contrast  Result Date: 01/01/2017 CLINICAL DATA:  Patient admitted for ecoli bacteremia, continues to spike fevers while on antibiotics. Patient has a productive cough, Acute renal insufficiency concern for possible perinephric abscess EXAM: CT ABDOMEN AND PELVIS WITHOUT CONTRAST TECHNIQUE: Multidetector CT imaging of  the abdomen and pelvis was performed following the standard protocol without IV contrast. COMPARISON:  05/02/2015 FINDINGS: Lower chest: Patchy airspace opacity is noted in both lower lobes. There are minimal pleural effusions. Heart is normal in size. Hepatobiliary: No focal liver abnormality is seen. Status post cholecystectomy. No biliary dilatation. Pancreas: Unremarkable. No pancreatic ductal dilatation or surrounding inflammatory changes. Spleen: Normal in size without focal abnormality. Adrenals/Urinary Tract: No adrenal masses. Mild dilation of the right intrarenal collecting system. Right kidney appears edematous. Right ureters normal course and caliber with no stones or evidence of obstruction. No left hydronephrosis. No intrarenal stones. No renal masses. Left ureter is normal course and caliber. Bladder is unremarkable. Stomach/Bowel: Status post gastric bypass surgery. No stomach wall thickening or adjacent inflammation. Small bowel is normal in caliber with no wall thickening or adjacent inflammation. Colon is unremarkable. Normal appendix visualized. Vascular/Lymphatic: No significant vascular findings are present. No enlarged abdominal or pelvic lymph nodes. Reproductive: Uterus and bilateral adnexa are unremarkable. Other: No abdominal wall hernia or abnormality. No abdominopelvic ascites. Musculoskeletal: No fracture or acute finding. No osteoblastic or osteolytic lesions. Significant disc degenerative changes in the lower lumbar spine. IMPRESSION: 1. Patchy consolidation in both lower lobes consistent with pneumonia. Minimal pleural effusions. 2. Mild right renal collecting system dilation and right renal edema, without a ureteral stone or ureteral dilation. Findings are suspicious for right pyelonephritis. No evidence of a perinephric abscess. 3. No other acute abnormalities. Electronically Signed   By: Lajean Manes M.D.   On: 01/01/2017 13:57    Cardiac Studies   Echo 12/29/16: EF 45-50%,  mild diffuse hypokinesis with no identifiable regional variations. Early diastolic septal annular tissue Doppler velocities Ea were abnormally low when adjusted for age.  Patient Profile     46 y.o. female with past medical history of DVT, PE on warfarin, chronic back pain, OSA w/CPAP intolerance admitted with sepsis syndrome. She was found to have minimally elevated troponin levels but that this is in the setting of acute renal failure and sepsis syndrome.  Assessment & Plan    1. Question of pericarditis:  -Pleuritic pain thought to be more likely related to pleuritis than pericarditis. EKG not consistent with pericarditis.  -She has sepsis syndrome and a trivial elevation in troponin levels. The troponin elevation, 0.05 with flat trend, is likely due to her acute renal insufficiency with  a creatinine of 4 and the setting of profound sepsis.  -Seems to be be getting better  -Colchicine stopped. Chest pain with cough improving.     2.   Mild acute systolic congestive heart failure:  -Echo shows EF 45-50%, likely due to her sepsis syndrome -No angina and troponin levels are more reflective of sepsis syndrome in the setting of acute renal insufficiency.  -Dr. Meda Coffee has tentatively recommended coronary CTA once the patient has recovered.  3. Acute renal insufficiency -Pt with acute UTI -SCr 2.55>>3.81>>4.09>>3.57>>2.28>>1.62.  Resolving.  4. Sepsis -Management by IM. On IV antibiotics.  -Acute UTI on Rocephin. -Still requiring 4L oxygen.  5. OSA -continue CPAP  6. Anemia -Hbg 7 today. For transfusion per IM.    Signed, Daune Perch, NP  01/02/2017, 10:45 AM

## 2017-01-02 NOTE — Progress Notes (Signed)
Office Visit Note   Patient: Alice [name]           Date of Birth: Mar 26, 1971           MRN: [id number] Visit Date: 12/22/2016              Requested by: Benito Mccreedy, MD [place] [address] [place],  [address] PCP: No primary care provider on file.  Chief Complaint  Patient presents with  . Right Leg - Wound Check      HPI: Patient presents in follow-up for venous insufficiency ulcer right leg. Has been wrapped twice weekly with Dynaflex compressive wrap with the Vive sock material against the wound. All other treatments had failed.  Assessment & Plan: Visit Diagnoses:  1. 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   Follow-Up Instructions: Return in about 4 days (around 12/26/2016).   Ortho Exam  Patient is alert, oriented, no adenopathy, well-dressed, normal affect, normal respiratory effort. Examination patient has had excellent improvement of the granulation tissue of the right leg, scant fibrinous exudative tissue. The wound is flat there is no epibole. There is superficial epithelialization around the wound. There is no odor no drainage no signs of infection there is no cellulitis.  Imaging: Ct Abdomen Pelvis Wo Contrast  Result Date: 01/01/2017 CLINICAL DATA:  Patient admitted for ecoli bacteremia, continues to spike fevers while on antibiotics. Patient has a productive cough, Acute renal insufficiency concern for possible perinephric abscess EXAM: CT ABDOMEN AND PELVIS WITHOUT CONTRAST TECHNIQUE: Multidetector CT imaging of the abdomen and pelvis was performed following the standard protocol without IV contrast. COMPARISON:  05/02/2015 FINDINGS: Lower chest: Patchy airspace opacity is noted in both lower lobes. There are minimal pleural effusions. Heart is normal in  size. Hepatobiliary: No focal liver abnormality is seen. Status post cholecystectomy. No biliary dilatation. Pancreas: Unremarkable. No pancreatic ductal dilatation or surrounding inflammatory changes. Spleen: Normal in size without focal abnormality. Adrenals/Urinary Tract: No adrenal masses. Mild dilation of the right intrarenal collecting system. Right kidney appears edematous. Right ureters normal course and caliber with no stones or evidence of obstruction. No left hydronephrosis. No intrarenal stones. No renal masses. Left ureter is normal course and caliber. Bladder is unremarkable. Stomach/Bowel: Status post gastric bypass surgery. No stomach wall thickening or adjacent inflammation. Small bowel is normal in caliber with no wall thickening or adjacent inflammation. Colon is unremarkable. Normal appendix visualized. Vascular/Lymphatic: No significant vascular findings are present. No enlarged abdominal or pelvic lymph nodes. Reproductive: Uterus and bilateral adnexa are unremarkable. Other: No abdominal wall hernia or abnormality. No abdominopelvic ascites. Musculoskeletal: No fracture or acute finding. No osteoblastic or osteolytic lesions. Significant disc degenerative changes in the lower lumbar spine. IMPRESSION: 1. Patchy consolidation in both lower lobes consistent with pneumonia. Minimal pleural effusions. 2. Mild right renal collecting system dilation and right renal edema, without a ureteral stone or ureteral dilation. Findings are suspicious for right pyelonephritis. No evidence of a perinephric abscess. 3. No other acute abnormalities. Electronically Signed   By: Lajean Manes M.D.   On: 01/01/2017 13:57    Labs: Lab Results  Component Value Date   REPTSTATUS PENDING 12/30/2016   REPTSTATUS PENDING 12/30/2016   GRAMSTAIN  05/17/2013    MODERATE WBC PRESENT, PREDOMINANTLY MONONUCLEAR MODERATE GRAM POSITIVE COCCI  IN PAIRS IN CHAINS RARE GRAM NEGATIVE RODS Gram Stain Report Called to,Read  Back By and Verified With: HOPPER,M RN @ [phone] 05/17/13 LEONARD,A   GRAMSTAIN  05/17/2013    MODERATE WBC PRESENT, PREDOMINANTLY MONONUCLEAR MODERATE SQUAMOUS EPITHELIAL CELLS PRESENT MODERATE GRAM POSITIVE COCCI IN PAIRS IN CHAINS RARE GRAM NEGATIVE RODS Performed at [facility] NO GROWTH 2 DAYS 12/30/2016   CULT NO GROWTH 2 DAYS 12/30/2016   LABORGA ESCHERICHIA COLI (A) 12/29/2016    Orders:  No orders of the defined types were placed in this encounter.  No orders of the defined types were placed in this encounter.    Procedures: No procedures performed  Clinical Data: No additional findings.  ROS:  All other systems negative, except as noted in the HPI. Review of Systems  Constitutional: Negative for chills and fever.  Cardiovascular: Positive for leg swelling.  Skin: Positive for wound. Negative for color change.    Objective: Vital Signs: Ht 5\' 8"  (1.727 m)   Wt (!) 340 lb (154.2 kg)   BMI 51.70 kg/m   Specialty Comments:  No specialty comments available.  PMFS History: Patient Active Problem List   Diagnosis Date Noted  . Acute lower UTI 12/29/2016  . Sepsis ([place]) 12/29/2016  . Acute kidney injury superimposed on CKD ([place]) 12/28/2016  . Chest pain 12/28/2016  . Hx of skin graft 08/01/2016  . 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 ([place]) 07/25/2016  . 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 Trichomonal infection 11/24/2015  . Abdominal pain 05/02/2015  . Symptomatic cholelithiasis 04/16/2015  . Acute bilateral upper abdominal pain 04/16/2015  . Microcytic anemia 05/18/2013  . Hypotension, unspecified 05/17/2013  . CKD (chronic kidney disease), stage III 05/17/2013  . Hypokalemia 05/17/2013  . Anal fissure 02/06/2013  . Anal skin tag 02/06/2013  . ALLERGIC RHINITIS 03/05/2010  . LOW BACK PAIN SYNDROME 12/13/2007  . WOUND INFECTION 10/10/2007  .  OBESITY, MORBID 12/02/2006  . HTN (hypertension) 12/02/2006  . History of pulmonary embolism 12/02/2006  . History of DVT (deep vein thrombosis) 12/02/2006  . 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 GASTROESOPHAGEAL REFLUX DISEASE 12/02/2006  . OSA (obstructive sleep apnea) 12/02/2006   Past Medical History:  Diagnosis Date  . Anemia   . Anxiety   . Chronic bronchitis ([place])   . Chronic upper back pain   . Depression   . DVT (deep venous thrombosis) ([facility])    BLE  . GERD (gastroesophageal reflux disease)   . Headache    "weekly" (04/16/2015)  . Hypertension   . Migraine    "monthly" (04/16/2015)  . Obesity   . Pneumonia 2014  . Pulmonary embolism ([place])   . Sinusitis nasal   . Sleep apnea    "I'm suppose to wear a mask but I don't" (04/16/2015)  . Varicose veins of right lower extremity   . Venous stasis of lower extremity    right    Family History  Problem Relation Age of Onset  . Kidney disease Mother        kidney transplant    Past Surgical History:  Procedure Laterality Date  . CHOLECYSTECTOMY N/A 04/18/2015   Procedure: LAPAROSCOPIC CHOLECYSTECTOMY;  Surgeon: Ralene Ok, MD;  Location: [facility];  Service: General;  Laterality: N/A;  . HYSTEROSCOPY W/D&C  12/24/2001   Archie Endo 09/28/2010  . I&D EXTREMITY Right 07/25/2016   Procedure: IRRIGATION AND DEBRIDEMENT  RIGHT LEG ULCER, APPLY VERAFLO VAC;  Surgeon: [name], MD;  Location: [facility];  Service: Orthopedics;  Laterality: Right;  . INCISE AND DRAIN ABCESS Right 07/14/2016  . INCISION AND DRAINAGE Right 09/10/2008   leg:  skin and soft tissue and muscle/notes 09/15/2010  . INCISION AND DRAINAGE Right 01/01/2008   Chronic venous stasis insufficiency ulcer,/notes 09/14/2010/  . INCISION AND DRAINAGE Right 08/5846   calf w/application wound vac/notes 07/20/2010  . INCISION AND DRAINAGE Right 07/25/2016   IRRIGATION AND DEBRIDEMENT RIGHT LEG ULCER,  . LAPAROSCOPIC GASTRIC BYPASS  ~ 2007  . SKIN GRAFT SPLIT  THICKNESS LEG / FOOT Right 07/25/2016   LEG  . SKIN SPLIT GRAFT Right 07/27/2016   Procedure: SKIN GRAFT RIGHT LEG WITH THERASKIN APPLICATION;  Surgeon: [name], MD;  Location: [facility];  Service: Orthopedics;  Laterality: Right;  . TONSILLECTOMY     Social History   Occupational History  . Not on file.   Social History Main Topics  . Smoking status: Never Smoker  . Smokeless tobacco: Never Used  . Alcohol use No  . Drug use: No  . Sexual activity: Yes    Partners: Male    Birth control/ protection: IUD

## 2017-01-02 NOTE — Progress Notes (Signed)
[facility]                                                                              Patient Demographics  Alice [name], is a 46 y.o. female, DOB - 11-27-1970, WGN:[id number]  Admit date - 12/28/2016   Admitting Physician Alice Bulls, MD  Outpatient Primary MD for the patient is No primary care provider on file.  Outpatient specialists:   LOS - 5  days   Medical records reviewed and are as summarized below:    Chief Complaint  Patient presents with  . Back Pain  . Chest Pain  . Shortness of Breath       Brief summary   Patient is a 46 year old female with history of DVT, PE on warfarin, chronic back pain, hypertension, OSA on CPAP intolerance, presented to ED with chest pain, shortness of breath. Patient reported the pain as sharp, severe, constant on both side of the chest with dyspnea. Chest x-ray showed no pneumonia. VQ scan negative for PE. Troponin +0.05. Patient was admitted to stepdown unit. UA positive for UTI.   Assessment & Plan    Principal Problem:   Sepsis ([place]) - Escherichia coli bacteremia, Escherichia coli UTI -Patient met sepsis criteria for 103, tachycardia 104, hypotension 97/51 UA positive for UTI, also being treated for venous ulcer on the right lower extremity  -  Appreciate Dr. Sharol [name] for evaluating the venous ulcer on right lower extremity-> clean -ID, Dr. Megan [name], transitioned to oral cephalexin for 9 more days - Repeat blood cultures 8/17, Negative so far - CT abdomen showed pyelonephritis, currently improving slowly still spiking low-grade fevers  Active Problems: Acute UTI ([place]) -Urine cultures Escherichia coli, ID following, transitioned to oral cephalexin For 9 more days  Atypical chest pain, new cardiomyopathy, elevated troponin - Unclear in etiology, patient reports constant chest pain with shortness of breath, worsens with inspiration or positional changes -  [name] history of DVT, PE, VQ scan was obtained which was  negative for pulmonary embolism - Cardiology following, per Dr. Meda [name] likely has presumed pericarditis, recommended colchicine, plan on stress test or coronary CTA once recovered from acute illness    Acute kidney injury superimposed on CKD, stage III (HCC) - Serum creatinine 2.5 on admission, baseline 1.1 - Possible prerenal, dehydration and recent vomiting, severe sepsis medications HCTZ prior to admission - Creatinine worsened to 4.09, plateaued - Creatinine improved to 1.6 today, DC IV fluids     hypotension  - Likely due to #1 sepsis, has a underlying history of hypertension - continue to hold HCTZ, amlodipine - BP now improving, DC IV fluids    History of pulmonary embolism. History of DVT (deep vein thrombosis) - Continue Coumadin per pharmacy, INR supratherapeutic possibly due to sepsis - Repeat VQ scan showed no pulmonary embolism    OSA (obstructive sleep apnea) - Continue CPAP  Hypokalemia -Continue scheduled potassium replacement    Microcytic anemia - Fe 7, saturation ratio 2, suggesting microcytic anemia however patient has B12 deficiency as well. B12 level 173 - H&H down to 7.0 possibly due to hemodilution however transfused 2 units packed RBCs  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 (HCC) - Dr. Sharol [name] following   Obesity Morbid, BMI 51.8 Patient recommended diet and weight control  Code Status: full  DVT Prophylaxis:  Warfarin on HOLD Family Communication: Discussed in detail with the patient, all imaging results, lab results explained to the patient   Disposition Plan: in SDU, start physical therapy evaluation today, possible DC next 24-48 hours  Time Spent in minutes   25 minutes  Procedures:  Renal ultrasound  Consultants:   Cardiology   Dr. Sharol [name] Infectious disease, Dr. Johnnye [name]  Antimicrobials:   IV Rocephin 8/16   Medications  Scheduled Meds: . aspirin EC  81 mg Oral Daily  . buPROPion  150 mg Oral Daily  .  cephALEXin  500 mg Oral Q12H  . cholecalciferol  5,000 Units Oral Daily  . colchicine  0.6 mg Oral Daily  . gabapentin  300 mg Oral QHS  . nystatin  5 mL Oral QID  . potassium chloride SA  40 mEq Oral TID  . sodium bicarbonate  1,300 mg Oral BID  . Warfarin - Pharmacist Dosing Inpatient   Does not apply q1800   Continuous Infusions: . sodium chloride     PRN Meds:.acetaminophen, albuterol, HYDROmorphone (DILAUDID) injection, lip balm, nitroGLYCERIN, ondansetron (ZOFRAN) IV, oxyCODONE   Antibiotics   Anti-infectives    Start     Dose/Rate Route Frequency Ordered Stop   01/02/17 1130  cephALEXin (KEFLEX) capsule 500 mg     500 mg Oral Every 12 hours 01/02/17 1121     01/02/17 1130  fluconazole (DIFLUCAN) tablet 100 mg  Status:  Discontinued     100 mg Oral Daily 01/02/17 1121 01/02/17 1214   12/30/16 1000  cefTRIAXone (ROCEPHIN) 2 g in dextrose 5 % 50 mL IVPB  Status:  Discontinued     2 g 100 mL/hr over 30 Minutes Intravenous Every 24 hours 12/30/16 0645 01/02/17 1121   12/29/16 1400  cefTRIAXone (ROCEPHIN) 1 g in dextrose 5 % 50 mL IVPB  Status:  Discontinued     1 g 100 mL/hr over 30 Minutes Intravenous Every 24 hours 12/29/16 1315 12/30/16 0645        Subjective:   Alice [name] was seen and examined today. Feeling better, slowly improving, very deconditioned. Low-grade fever 100.7 F today. No nausea, vomiting, chest pain, shortness of breath, no new weakness.  Objective:   Vitals:   01/02/17 1133 01/02/17 1238 01/02/17 1244 01/02/17 1245  BP: 135/89  121/66   Pulse: 97  95   Resp: (!) 29  16   Temp: (!) 100.7 F (38.2 C) 99.4 F (37.4 C) 99.5 F (37.5 C) 99.4 F (37.4 C)  TempSrc: Oral  Oral Oral  SpO2: 96%  95%   Weight:      Height:        Intake/Output Summary (Last 24 hours) at 01/02/17 1318 Last data filed at 01/02/17 1208  Gross per 24 hour  Intake          2826.67 ml  Output             1350 ml  Net          1476.67 ml     Wt Readings from Last  3 Encounters:  01/02/17 (!) 150.3 kg (331 lb 4.8 oz)  12/22/16 (!) 154.2 kg (340 lb)  12/19/16 (!) 154.2 kg (340 lb)     Exam   General: Alert and oriented x 3, NAD  Eyes:  HEENT:    Cardiovascular: S1 S2 auscultated, no rubs, murmurs or gallops. Regular rate and rhythm. Respiratory: Decreased breath sounds at the bases  Gastrointestinal: Soft, nontender, nondistended, + bowel sounds  Ext: no pedal edema bilaterally  Neuro: no new deficit  Musculoskeletal: No digital cyanosis, clubbing  Skin: No rashes  Psych: Normal affect and demeanor, alert and oriented x3    Data Reviewed:  I have personally reviewed following labs and imaging studies  Micro Results Recent Results (from the past 240 hour(s))  MRSA PCR Screening     Status: None   Collection Time: 12/29/16  5:32 AM  Result Value Ref Range Status   MRSA by PCR NEGATIVE NEGATIVE Final    Comment:        The GeneXpert MRSA Assay (FDA approved for NASAL specimens only), is one component of a comprehensive MRSA colonization surveillance program. It is not intended to diagnose MRSA infection nor to guide or monitor treatment for MRSA infections.   Culture, blood (routine x 2)     Status: Abnormal   Collection Time: 12/29/16  2:40 PM  Result Value Ref Range Status   Specimen Description BLOOD LEFT HAND  Final   Special Requests IN PEDIATRIC BOTTLE Blood Culture adequate volume  Final   Culture  Setup Time   Final    GRAM NEGATIVE RODS IN PEDIATRIC BOTTLE CRITICAL RESULT CALLED TO, READ BACK BY AND VERIFIED WITH: J.LEDFORD, PHARMD 12/30/16 0634 L.CHAMPION    Culture ESCHERICHIA COLI (A)  Final   Report Status 01/01/2017 FINAL  Final   Organism ID, Bacteria ESCHERICHIA COLI  Final      Susceptibility   Escherichia coli - MIC*    AMPICILLIN >=32 RESISTANT Resistant     CEFAZOLIN <=4 SENSITIVE Sensitive     CEFEPIME <=1 SENSITIVE Sensitive     CEFTAZIDIME <=1 SENSITIVE Sensitive     CEFTRIAXONE <=1  SENSITIVE Sensitive     CIPROFLOXACIN <=0.25 SENSITIVE Sensitive     GENTAMICIN <=1 SENSITIVE Sensitive     IMIPENEM <=0.25 SENSITIVE Sensitive     TRIMETH/SULFA >=320 RESISTANT Resistant     AMPICILLIN/SULBACTAM >=32 RESISTANT Resistant     PIP/TAZO <=4 SENSITIVE Sensitive     Extended ESBL NEGATIVE Sensitive     * ESCHERICHIA COLI  Culture, blood (routine x 2)     Status: Abnormal   Collection Time: 12/29/16  2:40 PM  Result Value Ref Range Status   Specimen Description BLOOD LEFT ARM  Final   Special Requests   Final    BOTTLES DRAWN AEROBIC ONLY Blood Culture adequate volume   Culture  Setup Time   Final    GRAM NEGATIVE RODS AEROBIC BOTTLE ONLY CRITICAL VALUE NOTED.  VALUE IS CONSISTENT WITH PREVIOUSLY REPORTED AND CALLED VALUE.    Culture (A)  Final    ESCHERICHIA COLI SUSCEPTIBILITIES PERFORMED ON PREVIOUS CULTURE WITHIN THE LAST 5 DAYS.    Report Status 01/01/2017 FINAL  Final  Blood Culture ID Panel (Reflexed)     Status: Abnormal   Collection Time: 12/29/16  2:40 PM  Result Value Ref Range Status   Enterococcus species NOT DETECTED NOT DETECTED Final   Vancomycin resistance NOT DETECTED NOT DETECTED Final   Listeria monocytogenes NOT DETECTED NOT DETECTED Final   Staphylococcus species NOT DETECTED NOT DETECTED Final   Staphylococcus aureus NOT DETECTED NOT DETECTED Final   Methicillin resistance NOT DETECTED NOT DETECTED Final   Streptococcus species NOT DETECTED NOT DETECTED Final   Streptococcus agalactiae NOT  DETECTED NOT DETECTED Final   Streptococcus pneumoniae NOT DETECTED NOT DETECTED Final   Streptococcus pyogenes NOT DETECTED NOT DETECTED Final   Acinetobacter baumannii NOT DETECTED NOT DETECTED Final   Enterobacteriaceae species DETECTED (A) NOT DETECTED Final    Comment: CRITICAL RESULT CALLED TO, READ BACK BY AND VERIFIED WITH: J.LEDFORD, PHARMD 12/30/16 0634 L.CHAMPION    Enterobacter cloacae complex NOT DETECTED NOT DETECTED Final   Escherichia coli  DETECTED (A) NOT DETECTED Final    Comment: CRITICAL RESULT CALLED TO, READ BACK BY AND VERIFIED WITH: J.LEDFORD, PHARMD 12/30/16 0634 L.CHAMPION    Klebsiella oxytoca NOT DETECTED NOT DETECTED Final   Klebsiella pneumoniae NOT DETECTED NOT DETECTED Final   Proteus species NOT DETECTED NOT DETECTED Final   Serratia marcescens NOT DETECTED NOT DETECTED Final   Carbapenem resistance NOT DETECTED NOT DETECTED Final   Haemophilus influenzae NOT DETECTED NOT DETECTED Final   Neisseria meningitidis NOT DETECTED NOT DETECTED Final   Pseudomonas aeruginosa NOT DETECTED NOT DETECTED Final   Candida albicans NOT DETECTED NOT DETECTED Final   Candida glabrata NOT DETECTED NOT DETECTED Final   Candida krusei NOT DETECTED NOT DETECTED Final   Candida parapsilosis NOT DETECTED NOT DETECTED Final   Candida tropicalis NOT DETECTED NOT DETECTED Final  Urine Culture     Status: Abnormal   Collection Time: 12/29/16  3:23 PM  Result Value Ref Range Status   Specimen Description URINE, CLEAN CATCH  Final   Special Requests NONE  Final   Culture >=100,000 COLONIES/mL ESCHERICHIA COLI (A)  Final   Report Status 12/31/2016 FINAL  Final   Organism ID, Bacteria ESCHERICHIA COLI (A)  Final      Susceptibility   Escherichia coli - MIC*    AMPICILLIN >=32 RESISTANT Resistant     CEFAZOLIN <=4 SENSITIVE Sensitive     CEFTRIAXONE <=1 SENSITIVE Sensitive     CIPROFLOXACIN <=0.25 SENSITIVE Sensitive     GENTAMICIN <=1 SENSITIVE Sensitive     IMIPENEM <=0.25 SENSITIVE Sensitive     NITROFURANTOIN <=16 SENSITIVE Sensitive     TRIMETH/SULFA >=320 RESISTANT Resistant     AMPICILLIN/SULBACTAM 16 INTERMEDIATE Intermediate     PIP/TAZO <=4 SENSITIVE Sensitive     Extended ESBL NEGATIVE Sensitive     * >=100,000 COLONIES/mL ESCHERICHIA COLI  Culture, blood (routine x 2)     Status: None (Preliminary result)   Collection Time: 12/30/16  7:10 PM  Result Value Ref Range Status   Specimen Description BLOOD LEFT HAND   Final   Special Requests IN PEDIATRIC BOTTLE Blood Culture adequate volume  Final   Culture NO GROWTH 3 DAYS  Final   Report Status PENDING  Incomplete  Culture, blood (routine x 2)     Status: None (Preliminary result)   Collection Time: 12/30/16  7:10 PM  Result Value Ref Range Status   Specimen Description BLOOD RIGHT HAND  Final   Special Requests   Final    BOTTLES DRAWN AEROBIC ONLY Blood Culture adequate volume   Culture NO GROWTH 3 DAYS  Final   Report Status PENDING  Incomplete    Radiology Reports Ct Abdomen Pelvis Wo Contrast  Result Date: 01/01/2017 CLINICAL DATA:  Patient admitted for ecoli bacteremia, continues to spike fevers while on antibiotics. Patient has a productive cough, Acute renal insufficiency concern for possible perinephric abscess EXAM: CT ABDOMEN AND PELVIS WITHOUT CONTRAST TECHNIQUE: Multidetector CT imaging of the abdomen and pelvis was performed following the standard protocol without IV contrast. COMPARISON:  05/02/2015  FINDINGS: Lower chest: Patchy airspace opacity is noted in both lower lobes. There are minimal pleural effusions. Heart is normal in size. Hepatobiliary: No focal liver abnormality is seen. Status post cholecystectomy. No biliary dilatation. Pancreas: Unremarkable. No pancreatic ductal dilatation or surrounding inflammatory changes. Spleen: Normal in size without focal abnormality. Adrenals/Urinary Tract: No adrenal masses. Mild dilation of the right intrarenal collecting system. Right kidney appears edematous. Right ureters normal course and caliber with no stones or evidence of obstruction. No left hydronephrosis. No intrarenal stones. No renal masses. Left ureter is normal course and caliber. Bladder is unremarkable. Stomach/Bowel: Status post gastric bypass surgery. No stomach wall thickening or adjacent inflammation. Small bowel is normal in caliber with no wall thickening or adjacent inflammation. Colon is unremarkable. Normal appendix  visualized. Vascular/Lymphatic: No significant vascular findings are present. No enlarged abdominal or pelvic lymph nodes. Reproductive: Uterus and bilateral adnexa are unremarkable. Other: No abdominal wall hernia or abnormality. No abdominopelvic ascites. Musculoskeletal: No fracture or acute finding. No osteoblastic or osteolytic lesions. Significant disc degenerative changes in the lower lumbar spine. IMPRESSION: 1. Patchy consolidation in both lower lobes consistent with pneumonia. Minimal pleural effusions. 2. Mild right renal collecting system dilation and right renal edema, without a ureteral stone or ureteral dilation. Findings are suspicious for right pyelonephritis. No evidence of a perinephric abscess. 3. No other acute abnormalities. Electronically Signed   By: Lajean Manes M.D.   On: 01/01/2017 13:57   US Renal  Result Date: 12/28/2016 CLINICAL DATA:  Acute kidney injury, pain in chills for 2 days EXAM: RENAL / URINARY TRACT ULTRASOUND COMPLETE COMPARISON:  05/02/2015, 05/17/2013 FINDINGS: Right Kidney: Length: 10.9 cm. Echogenicity within normal limits. No mass or hydronephrosis visualized. Left Kidney: Length: 11.7 cm. Echogenicity within normal limits. No mass or hydronephrosis visualized. Bladder: Appears normal for degree of bladder distention. IMPRESSION: Negative renal ultrasound Electronically Signed   By: Donavan Foil M.D.   On: 12/28/2016 22:05   Nm Pulmonary Vent And Perf (v/q Scan)  Result Date: 12/28/2016 CLINICAL DATA:  Chest pain and shortness of Breath EXAM: NUCLEAR MEDICINE VENTILATION - PERFUSION LUNG SCAN TECHNIQUE: Ventilation images were obtained in multiple projections using inhaled aerosol Tc-57mDTPA. Perfusion images were obtained in multiple projections after intravenous injection of Tc-961mAA. RADIOPHARMACEUTICALS:  31.6 mCi Technetium-9933mPA aerosol inhalation and 4.24 mCi Technetium-45m18m IV COMPARISON:  Chest x-ray from earlier in the same day. FINDINGS:  Ventilation: No focal ventilation defect. Perfusion: Lateral imaging could not be performed due the patient's size and cold for although the remaining perfusion images show no significant perfusion defect. IMPRESSION: No evidence of pulmonary embolism. Electronically Signed   By: MarkInez Catalina.   On: 12/28/2016 19:40   Dg Chest Portable 1 View  Result Date: 12/28/2016 CLINICAL DATA:  Shortness of breath and chest pain EXAM: PORTABLE CHEST 1 VIEW COMPARISON:  April 19, 2015 FINDINGS: Lungs are clear. Heart is borderline enlarged with pulmonary vascularity within normal limits. No adenopathy. No pneumothorax. No bone lesions. IMPRESSION: Borderline cardiac enlargement.  No edema or consolidation. Electronically Signed   By: WillLowella Grip M.D.   On: 12/28/2016 17:09    Lab Data:  CBC:  Recent Labs Lab 12/29/16 1440 12/30/16 0241 12/31/16 0530 01/01/17 0934 01/02/17 0520  WBC 15.2* 17.3* 12.4* 9.5 13.0*  HGB 7.8* 8.5* 8.0* 8.3* 7.0*  HCT 24.0* 25.7* 24.4* 25.0* 20.6*  MCV 67.0* 66.6* 65.9* 64.9* 63.8*  PLT 208 211 227 240 253 998asic Metabolic  Panel:  Recent Labs Lab 12/28/16 1704 12/29/16 1440 12/30/16 0241 12/31/16 0530 01/01/17 0934 01/02/17 0520  NA 137 137 134* 137 134* 138  K 2.5* 3.1* 3.2* 3.6 3.2* 3.4*  CL 107 108 107 111 111 109  CO2 20* 21* 18* 15* 15* 20*  GLUCOSE 115* 117* 110* 85 128* 88  BUN 35* 38* 42* 39* 29* 20  CREATININE 2.55* 3.81* 4.09* 3.57* 2.28* 1.62*  CALCIUM 8.4* 7.8* 7.7* 8.0* 8.1* 8.0*  MG 1.9 1.8  --   --   --   --   PHOS  --   --   --   --   --  1.9*   GFR: Estimated Creatinine Clearance: 68.9 mL/min (A) (by C-G formula based on SCr of 1.62 mg/dL (H)). Liver Function Tests:  Recent Labs Lab 01/02/17 0520  ALBUMIN 2.1*   No results for input(s): LIPASE, AMYLASE in the last 168 hours. No results for input(s): AMMONIA in the last 168 hours. Coagulation Profile:  Recent Labs Lab 12/29/16 1440 12/30/16 0241 12/31/16 0530  01/01/17 0934 01/02/17 0520  INR 3.23 4.33* 6.39* 5.75* 4.97*   Cardiac Enzymes:  Recent Labs Lab 12/28/16 2240 12/29/16 0210 12/29/16 1440  TROPONINI 0.05* 0.05* 0.05*   BNP (last 3 results) No results for input(s): PROBNP in the last 8760 hours. HbA1C: No results for input(s): HGBA1C in the last 72 hours. CBG:  Recent Labs Lab 12/29/16 0531 01/01/17 2202  GLUCAP 119* 87   Lipid Profile: No results for input(s): CHOL, HDL, LDLCALC, TRIG, CHOLHDL, LDLDIRECT in the last 72 hours. Thyroid Function Tests: No results for input(s): TSH, T4TOTAL, FREET4, T3FREE, THYROIDAB in the last 72 hours. Anemia Panel: No results for input(s): VITAMINB12, FOLATE, FERRITIN, TIBC, IRON, RETICCTPCT in the last 72 hours. Urine analysis:    Component Value Date/Time   COLORURINE YELLOW 12/28/2016 2200   APPEARANCEUR CLOUDY (A) 12/28/2016 2200   LABSPEC 1.012 12/28/2016 2200   PHURINE 5.0 12/28/2016 2200   GLUCOSEU NEGATIVE 12/28/2016 2200   HGBUR LARGE (A) 12/28/2016 2200   BILIRUBINUR NEGATIVE 12/28/2016 2200   KETONESUR NEGATIVE 12/28/2016 2200   PROTEINUR 100 (A) 12/28/2016 2200   UROBILINOGEN 4.0 (H) 11/25/2013 1511   NITRITE POSITIVE (A) 12/28/2016 2200   LEUKOCYTESUR LARGE (A) 12/28/2016 2200     Kaleth Koy M.D. [facility] 01/02/2017, 1:18 PM  Pager: [phone] Between 7am to 7pm - call Pager - [phone]  After 7pm go to [email or web address] - password [email or web address]  Call night coverage person covering after 7pm

## 2017-01-02 NOTE — Progress Notes (Addendum)
Patient ID: Alice [name], female   DOB: 05-06-1971, 46 y.o.   MRN: [id number]          [facility] for Infectious Disease  Date of Admission:  12/28/2016   Total days of antibiotics 5         ASSESSMENT: She is improving on therapy for probable Escherichia coli pyelonephritis. I will change her to oral cephalexin and plan on 9 more days of therapy.  PLAN: 1. Change ceftriaxone to oral cephalexin and treat for 9 more days 2. I will sign off now  Principal Problem:   Sepsis ([place]) Active Problems:   HTN (hypertension)   History of pulmonary embolism   History of DVT (deep vein thrombosis)   OSA (obstructive sleep apnea)   CKD (chronic kidney disease), stage III   Hypokalemia   Microcytic anemia   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 (HCC)   Acute kidney injury superimposed on CKD ([place])   Chest pain   Acute lower UTI   . aspirin EC  81 mg Oral Daily  . buPROPion  150 mg Oral Daily  . cholecalciferol  5,000 Units Oral Daily  . colchicine  0.6 mg Oral Daily  . gabapentin  300 mg Oral QHS  . nystatin  5 mL Oral QID  . potassium chloride SA  40 mEq Oral TID  . Warfarin - Pharmacist Dosing Inpatient   Does not apply q1800    SUBJECTIVE: She is feeling better. She is no longer having any nausea, vomiting or headache. She continues to have bilateral lower back pain which began several weeks ago. She denies dysuria but states it feels like she is having incomplete emptying of her bladder when she does urinate.  Review of Systems: Review of Systems  Constitutional: Negative for chills, diaphoresis and fever.  Respiratory: Negative for cough.   Cardiovascular: Negative for chest pain.  Gastrointestinal: Negative for abdominal pain, diarrhea, nausea and vomiting.  Genitourinary: Positive for flank pain. Negative for dysuria.       As noted in history of present illness.  Musculoskeletal: Positive for back pain. Negative for myalgias.    Allergies    Allergen Reactions  . Oxycodone Hcl Nausea Only  . Vicodin [Hydrocodone-Acetaminophen] Nausea And Vomiting    OBJECTIVE: Vitals:   01/01/17 1555 01/01/17 2007 01/02/17 0245 01/02/17 0700  BP: 133/81 116/82 131/83   Pulse:   95   Resp:  (!) 32 (!) 28   Temp: 100 F (37.8 C) 99.3 F (37.4 C) 99 F (37.2 C) 99.2 F (37.3 C)  TempSrc: Oral   Oral  SpO2: 90% 93% 95%   Weight:   (!) 331 lb 4.8 oz (150.3 kg)   Height:       Body mass index is 50.37 kg/m.  Physical Exam  Constitutional: She is oriented to person, place, and time.  She is sitting up in bed. She is in good spirits.  Cardiovascular: Normal rate and regular rhythm.   No murmur heard. Pulmonary/Chest: Effort normal and breath sounds normal. She has no wheezes. She has no rales.  Abdominal: Soft. There is no tenderness.  Genitourinary:  Genitourinary Comments: No CVA tenderness with percussion.  Neurological: She is alert and oriented to person, place, and time.  Skin: No rash noted.  Psychiatric: Mood and affect normal.    Lab Results Lab Results  Component Value Date   WBC 13.0 (H) 01/02/2017   HGB 7.0 (L) 01/02/2017   HCT 20.6 (L)  01/02/2017   MCV 63.8 (L) 01/02/2017   PLT 253 01/02/2017    Lab Results  Component Value Date   CREATININE 1.62 (H) 01/02/2017   BUN 20 01/02/2017   NA 138 01/02/2017   K 3.4 (L) 01/02/2017   CL 109 01/02/2017   CO2 20 (L) 01/02/2017    Lab Results  Component Value Date   ALT 18 07/13/2015   AST 30 07/13/2015   ALKPHOS 96 07/13/2015   BILITOT 1.0 07/13/2015     Microbiology: Recent Results (from the past 240 hour(s))  MRSA PCR Screening     Status: None   Collection Time: 12/29/16  5:32 AM  Result Value Ref Range Status   MRSA by PCR NEGATIVE NEGATIVE Final    Comment:        The GeneXpert MRSA Assay (FDA approved for NASAL specimens only), is one component of a comprehensive MRSA colonization surveillance program. It is not intended to diagnose  MRSA infection nor to guide or monitor treatment for MRSA infections.   Culture, blood (routine x 2)     Status: Abnormal   Collection Time: 12/29/16  2:40 PM  Result Value Ref Range Status   Specimen Description BLOOD LEFT HAND  Final   Special Requests IN PEDIATRIC BOTTLE Blood Culture adequate volume  Final   Culture  Setup Time   Final    GRAM NEGATIVE RODS IN PEDIATRIC BOTTLE CRITICAL RESULT CALLED TO, READ BACK BY AND VERIFIED WITH: J.LEDFORD, PHARMD 12/30/16 0634 L.CHAMPION    Culture ESCHERICHIA COLI (A)  Final   Report Status 01/01/2017 FINAL  Final   Organism ID, Bacteria ESCHERICHIA COLI  Final      Susceptibility   Escherichia coli - MIC*    AMPICILLIN >=32 RESISTANT Resistant     CEFAZOLIN <=4 SENSITIVE Sensitive     CEFEPIME <=1 SENSITIVE Sensitive     CEFTAZIDIME <=1 SENSITIVE Sensitive     CEFTRIAXONE <=1 SENSITIVE Sensitive     CIPROFLOXACIN <=0.25 SENSITIVE Sensitive     GENTAMICIN <=1 SENSITIVE Sensitive     IMIPENEM <=0.25 SENSITIVE Sensitive     TRIMETH/SULFA >=320 RESISTANT Resistant     AMPICILLIN/SULBACTAM >=32 RESISTANT Resistant     PIP/TAZO <=4 SENSITIVE Sensitive     Extended ESBL NEGATIVE Sensitive     * ESCHERICHIA COLI  Culture, blood (routine x 2)     Status: Abnormal   Collection Time: 12/29/16  2:40 PM  Result Value Ref Range Status   Specimen Description BLOOD LEFT ARM  Final   Special Requests   Final    BOTTLES DRAWN AEROBIC ONLY Blood Culture adequate volume   Culture  Setup Time   Final    GRAM NEGATIVE RODS AEROBIC BOTTLE ONLY CRITICAL VALUE NOTED.  VALUE IS CONSISTENT WITH PREVIOUSLY REPORTED AND CALLED VALUE.    Culture (A)  Final    ESCHERICHIA COLI SUSCEPTIBILITIES PERFORMED ON PREVIOUS CULTURE WITHIN THE LAST 5 DAYS.    Report Status 01/01/2017 FINAL  Final  Blood Culture ID Panel (Reflexed)     Status: Abnormal   Collection Time: 12/29/16  2:40 PM  Result Value Ref Range Status   Enterococcus species NOT DETECTED NOT  DETECTED Final   Vancomycin resistance NOT DETECTED NOT DETECTED Final   Listeria monocytogenes NOT DETECTED NOT DETECTED Final   Staphylococcus species NOT DETECTED NOT DETECTED Final   Staphylococcus aureus NOT DETECTED NOT DETECTED Final   Methicillin resistance NOT DETECTED NOT DETECTED Final   Streptococcus species NOT DETECTED  NOT DETECTED Final   Streptococcus agalactiae NOT DETECTED NOT DETECTED Final   Streptococcus pneumoniae NOT DETECTED NOT DETECTED Final   Streptococcus pyogenes NOT DETECTED NOT DETECTED Final   Acinetobacter baumannii NOT DETECTED NOT DETECTED Final   Enterobacteriaceae species DETECTED (A) NOT DETECTED Final    Comment: CRITICAL RESULT CALLED TO, READ BACK BY AND VERIFIED WITH: J.LEDFORD, PHARMD 12/30/16 0634 L.CHAMPION    Enterobacter cloacae complex NOT DETECTED NOT DETECTED Final   Escherichia coli DETECTED (A) NOT DETECTED Final    Comment: CRITICAL RESULT CALLED TO, READ BACK BY AND VERIFIED WITH: J.LEDFORD, PHARMD 12/30/16 0634 L.CHAMPION    Klebsiella oxytoca NOT DETECTED NOT DETECTED Final   Klebsiella pneumoniae NOT DETECTED NOT DETECTED Final   Proteus species NOT DETECTED NOT DETECTED Final   Serratia marcescens NOT DETECTED NOT DETECTED Final   Carbapenem resistance NOT DETECTED NOT DETECTED Final   Haemophilus influenzae NOT DETECTED NOT DETECTED Final   Neisseria meningitidis NOT DETECTED NOT DETECTED Final   Pseudomonas aeruginosa NOT DETECTED NOT DETECTED Final   Candida albicans NOT DETECTED NOT DETECTED Final   Candida glabrata NOT DETECTED NOT DETECTED Final   Candida krusei NOT DETECTED NOT DETECTED Final   Candida parapsilosis NOT DETECTED NOT DETECTED Final   Candida tropicalis NOT DETECTED NOT DETECTED Final  Urine Culture     Status: Abnormal   Collection Time: 12/29/16  3:23 PM  Result Value Ref Range Status   Specimen Description URINE, CLEAN CATCH  Final   Special Requests NONE  Final   Culture >=100,000 COLONIES/mL  ESCHERICHIA COLI (A)  Final   Report Status 12/31/2016 FINAL  Final   Organism ID, Bacteria ESCHERICHIA COLI (A)  Final      Susceptibility   Escherichia coli - MIC*    AMPICILLIN >=32 RESISTANT Resistant     CEFAZOLIN <=4 SENSITIVE Sensitive     CEFTRIAXONE <=1 SENSITIVE Sensitive     CIPROFLOXACIN <=0.25 SENSITIVE Sensitive     GENTAMICIN <=1 SENSITIVE Sensitive     IMIPENEM <=0.25 SENSITIVE Sensitive     NITROFURANTOIN <=16 SENSITIVE Sensitive     TRIMETH/SULFA >=320 RESISTANT Resistant     AMPICILLIN/SULBACTAM 16 INTERMEDIATE Intermediate     PIP/TAZO <=4 SENSITIVE Sensitive     Extended ESBL NEGATIVE Sensitive     * >=100,000 COLONIES/mL ESCHERICHIA COLI  Culture, blood (routine x 2)     Status: None (Preliminary result)   Collection Time: 12/30/16  7:10 PM  Result Value Ref Range Status   Specimen Description BLOOD LEFT HAND  Final   Special Requests IN PEDIATRIC BOTTLE Blood Culture adequate volume  Final   Culture NO GROWTH 2 DAYS  Final   Report Status PENDING  Incomplete  Culture, blood (routine x 2)     Status: None (Preliminary result)   Collection Time: 12/30/16  7:10 PM  Result Value Ref Range Status   Specimen Description BLOOD RIGHT HAND  Final   Special Requests   Final    BOTTLES DRAWN AEROBIC ONLY Blood Culture adequate volume   Culture NO GROWTH 2 DAYS  Final   Report Status PENDING  Incomplete    Michel Bickers, MD [place] [phone] pager   [phone] cell 01/02/2017, 11:15 AM

## 2017-01-02 NOTE — Progress Notes (Signed)
Oral temp 100.7. Administered 650mg  of tylenol and paged MD Rai. Verbal to hold blood start until temp is less than 100. I will continue to monitor the patient closely.   Saddie Benders RN

## 2017-01-02 NOTE — Progress Notes (Signed)
Subjective: Interval History: has complaints tired.  Objective: Vital signs in last 24 hours: Temp:  [99 F (37.2 C)-100.7 F (38.2 C)] 100.7 F (38.2 C) (08/20 1133) Pulse Rate:  [95-97] 97 (08/20 1133) Resp:  [28-32] 29 (08/20 1133) BP: (116-135)/(81-89) 135/89 (08/20 1133) SpO2:  [90 %-96 %] 96 % (08/20 1133) Weight:  [150.3 kg (331 lb 4.8 oz)] 150.3 kg (331 lb 4.8 oz) (08/20 0245) Weight change: 0.077 kg (2.7 oz)  Intake/Output from previous day: 08/19 0701 - 08/20 0700 In: 1985 [P.O.:360; I.V.:1625] Out: 1350 [Urine:1350] Intake/Output this shift: Total I/O In: 250 [I.V.:250] Out: -   General appearance: alert, cooperative, mild distress, moderately obese and slowed mentation Resp: diminished breath sounds bilaterally Breasts: normal appearance, no masses or tenderness, not eval GI: obese, pos bs, soft, Extremities: edema 2+  Lab Results:  Recent Labs  01/01/17 0934 01/02/17 0520  WBC 9.5 13.0*  HGB 8.3* 7.0*  HCT 25.0* 20.6*  PLT 240 253   BMET:  Recent Labs  01/01/17 0934 01/02/17 0520  NA 134* 138  K 3.2* 3.4*  CL 111 109  CO2 15* 20*  GLUCOSE 128* 88  BUN 29* 20  CREATININE 2.28* 1.62*  CALCIUM 8.1* 8.0*   No results for input(s): PTH in the last 72 hours. Iron Studies: No results for input(s): IRON, TIBC, TRANSFERRIN, FERRITIN in the last 72 hours.  Studies/Results: Ct Abdomen Pelvis Wo Contrast  Result Date: 01/01/2017 CLINICAL DATA:  Patient admitted for ecoli bacteremia, continues to spike fevers while on antibiotics. Patient has a productive cough, Acute renal insufficiency concern for possible perinephric abscess EXAM: CT ABDOMEN AND PELVIS WITHOUT CONTRAST TECHNIQUE: Multidetector CT imaging of the abdomen and pelvis was performed following the standard protocol without IV contrast. COMPARISON:  05/02/2015 FINDINGS: Lower chest: Patchy airspace opacity is noted in both lower lobes. There are minimal pleural effusions. Heart is normal in  size. Hepatobiliary: No focal liver abnormality is seen. Status post cholecystectomy. No biliary dilatation. Pancreas: Unremarkable. No pancreatic ductal dilatation or surrounding inflammatory changes. Spleen: Normal in size without focal abnormality. Adrenals/Urinary Tract: No adrenal masses. Mild dilation of the right intrarenal collecting system. Right kidney appears edematous. Right ureters normal course and caliber with no stones or evidence of obstruction. No left hydronephrosis. No intrarenal stones. No renal masses. Left ureter is normal course and caliber. Bladder is unremarkable. Stomach/Bowel: Status post gastric bypass surgery. No stomach wall thickening or adjacent inflammation. Small bowel is normal in caliber with no wall thickening or adjacent inflammation. Colon is unremarkable. Normal appendix visualized. Vascular/Lymphatic: No significant vascular findings are present. No enlarged abdominal or pelvic lymph nodes. Reproductive: Uterus and bilateral adnexa are unremarkable. Other: No abdominal wall hernia or abnormality. No abdominopelvic ascites. Musculoskeletal: No fracture or acute finding. No osteoblastic or osteolytic lesions. Significant disc degenerative changes in the lower lumbar spine. IMPRESSION: 1. Patchy consolidation in both lower lobes consistent with pneumonia. Minimal pleural effusions. 2. Mild right renal collecting system dilation and right renal edema, without a ureteral stone or ureteral dilation. Findings are suspicious for right pyelonephritis. No evidence of a perinephric abscess. 3. No other acute abnormalities. Electronically Signed   By: Lajean Manes M.D.   On: 01/01/2017 13:57    I have reviewed the patient's current medications.  Assessment/Plan: 1 AKI from sepsis, NSAID, ACEI.  Improving, has vol xs.  Stop ivf , use po bicarb 2 MA use po bicarb 3 Sepsis on AB, has pyelo 4 Obesity 5 OSA P stop ivf,  use po bicarb.  Will s/o at this time and see again at your  request     LOS: 5 days   Rodriquez Thorner L 01/02/2017,11:58 AM

## 2017-01-02 NOTE — Progress Notes (Signed)
ANTICOAGULATION CONSULT NOTE - Follow Up  Pharmacy Consult:  Coumadin Indication:  History of PE  Allergies  Allergen Reactions  . Oxycodone Hcl Nausea Only  . Vicodin [Hydrocodone-Acetaminophen] Nausea And Vomiting    Patient Measurements: Height: 5\' 8"  (172.7 cm) Weight: (!) 331 lb 4.8 oz (150.3 kg) IBW/kg (Calculated) : 63.9  Vital Signs: Temp: 99.2 F (37.3 C) (08/20 0700) Temp Source: Oral (08/20 0700) BP: 131/83 (08/20 0245) Pulse Rate: 95 (08/20 0245)  Labs:  Recent Labs  12/31/16 0530 01/01/17 0934 01/02/17 0520  HGB 8.0* 8.3* 7.0*  HCT 24.4* 25.0* 20.6*  PLT 227 240 253  LABPROT 58.2* 53.5* 47.6*  INR 6.39* 5.75* 4.97*  CREATININE 3.57* 2.28* 1.62*    Estimated Creatinine Clearance: 68.9 mL/min (A) (by C-G formula based on SCr of 1.62 mg/dL (H)).   Assessment: 46 y.o. female presented with dyspnea and chest pain, taking Coumadin PTA for history of PE. INR remains supratherapeutic, decreased to 4.97 today after warfarin held x3. Last dose warfarin 12/29/2016. Continues on CTX, which may be contributing to elevated INR. Pt with moderate PO intake. CBC low but stable. No bleeding reported.  PTA warfarin dose: 12.5mg  PO daily Admit INR: 2.96  Goal of Therapy:  INR 2-3 Monitor platelets by anticoagulation protocol: Yes   Plan:  Continue to hold Coumadin Daily INR Monitor for s/sx bleeding  Elicia Lamp, PharmD, BCPS Clinical Pharmacist Rx Phone # for today: [phone] After 3:30PM, please call Main Rx: #[id number] 01/02/2017 8:40 AM

## 2017-01-03 LAB — BPAM RBC
Blood Product Expiration Date: 201809132359
Blood Product Expiration Date: 201809132359
ISSUE DATE / TIME: 201808201243
ISSUE DATE / TIME: 201808201705
UNIT TYPE AND RH: 8400
Unit Type and Rh: 8400

## 2017-01-03 LAB — TYPE AND SCREEN
ABO/RH(D): AB POS
ANTIBODY SCREEN: NEGATIVE
Unit division: 0
Unit division: 0

## 2017-01-03 LAB — CBC
HEMATOCRIT: 27.2 % — AB (ref 36.0–46.0)
HEMOGLOBIN: 9.6 g/dL — AB (ref 12.0–15.0)
MCH: 23.6 pg — ABNORMAL LOW (ref 26.0–34.0)
MCHC: 35.3 g/dL (ref 30.0–36.0)
MCV: 66.8 fL — AB (ref 78.0–100.0)
Platelets: 338 10*3/uL (ref 150–400)
RBC: 4.07 MIL/uL (ref 3.87–5.11)
RDW: 20 % — ABNORMAL HIGH (ref 11.5–15.5)
WBC: 14.6 10*3/uL — AB (ref 4.0–10.5)

## 2017-01-03 LAB — RENAL FUNCTION PANEL
ANION GAP: 10 (ref 5–15)
Albumin: 2.2 g/dL — ABNORMAL LOW (ref 3.5–5.0)
BUN: 12 mg/dL (ref 6–20)
CALCIUM: 8.4 mg/dL — AB (ref 8.9–10.3)
CO2: 21 mmol/L — AB (ref 22–32)
Chloride: 109 mmol/L (ref 101–111)
Creatinine, Ser: 1.26 mg/dL — ABNORMAL HIGH (ref 0.44–1.00)
GFR calc Af Amer: 59 mL/min — ABNORMAL LOW (ref >60)
GFR calc non Af Amer: 51 mL/min — ABNORMAL LOW (ref >60)
GLUCOSE: 93 mg/dL (ref 65–99)
Phosphorus: 2.7 mg/dL (ref 2.5–4.6)
Potassium: 3.7 mmol/L (ref 3.5–5.1)
SODIUM: 140 mmol/L (ref 135–145)

## 2017-01-03 LAB — PROTIME-INR
INR: 5.56
Prothrombin Time: 52.1 seconds — ABNORMAL HIGH (ref 11.4–15.2)

## 2017-01-03 LAB — BASIC METABOLIC PANEL
ANION GAP: 11 (ref 5–15)
BUN: 13 mg/dL (ref 6–20)
CALCIUM: 8.3 mg/dL — AB (ref 8.9–10.3)
CHLORIDE: 110 mmol/L (ref 101–111)
CO2: 20 mmol/L — AB (ref 22–32)
Creatinine, Ser: 1.28 mg/dL — ABNORMAL HIGH (ref 0.44–1.00)
GFR calc Af Amer: 58 mL/min — ABNORMAL LOW (ref >60)
GFR calc non Af Amer: 50 mL/min — ABNORMAL LOW (ref >60)
GLUCOSE: 92 mg/dL (ref 65–99)
POTASSIUM: 3.7 mmol/L (ref 3.5–5.1)
Sodium: 141 mmol/L (ref 135–145)

## 2017-01-03 LAB — GLUCOSE, CAPILLARY: Glucose-Capillary: 86 mg/dL (ref 65–99)

## 2017-01-03 MED ORDER — PANTOPRAZOLE SODIUM 40 MG PO TBEC: 40.0000 mg | DELAYED_RELEASE_TABLET | Freq: Every day | ORAL | 0 refills | Status: DC

## 2017-01-03 MED ORDER — SODIUM BICARBONATE 650 MG PO TABS: 1300.0000 mg | ORAL_TABLET | Freq: Two times a day (BID) | ORAL | 0 refills | Status: DC

## 2017-01-03 MED ORDER — CEPHALEXIN 500 MG PO CAPS: 500.0000 mg | ORAL_CAPSULE | Freq: Three times a day (TID) | ORAL | 0 refills | Status: DC

## 2017-01-03 MED ORDER — CEPHALEXIN 500 MG PO CAPS: 500.0000 mg | ORAL_CAPSULE | Freq: Three times a day (TID) | ORAL | Status: DC

## 2017-01-03 MED ORDER — CEPHALEXIN 500 MG PO CAPS: 500.0000 mg | ORAL_CAPSULE | Freq: Two times a day (BID) | ORAL | 0 refills | Status: DC

## 2017-01-03 MED ORDER — PHYTONADIONE 5 MG PO TABS
2.5000 mg | ORAL_TABLET | Freq: Once | ORAL | Status: AC
  Administered 2017-01-03: 2.5 mg via ORAL
  Filled 2017-01-03: qty 1

## 2017-01-03 MED ORDER — ASPIRIN 81 MG PO TBEC: 81.0000 mg | DELAYED_RELEASE_TABLET | Freq: Every day | ORAL | 3 refills | Status: DC

## 2017-01-03 MED ORDER — NYSTATIN 100000 UNIT/ML MT SUSP: 5.0000 mL | Freq: Four times a day (QID) | OROMUCOSAL | 0 refills | Status: DC

## 2017-01-03 MED ORDER — VALACYCLOVIR HCL 1 G PO TABS: 2000.0000 mg | ORAL_TABLET | Freq: Two times a day (BID) | ORAL | 1 refills | Status: DC

## 2017-01-03 MED ORDER — PANTOPRAZOLE SODIUM 40 MG PO TBEC
40.0000 mg | DELAYED_RELEASE_TABLET | Freq: Every day | ORAL | Status: DC
  Administered 2017-01-03: 40 mg via ORAL
  Filled 2017-01-03: qty 1

## 2017-01-03 MED ORDER — VALACYCLOVIR HCL 500 MG PO TABS
2000.0000 mg | ORAL_TABLET | Freq: Two times a day (BID) | ORAL | Status: DC
  Administered 2017-01-03: 2000 mg via ORAL
  Filled 2017-01-03: qty 4

## 2017-01-03 MED ORDER — NITROGLYCERIN 0.4 MG SL SUBL: 0.4000 mg | SUBLINGUAL_TABLET | SUBLINGUAL | 4 refills | Status: AC | PRN

## 2017-01-03 MED ORDER — WARFARIN SODIUM 2.5 MG PO TABS: 12.5000 mg | ORAL_TABLET | Freq: Once | ORAL | 0 refills | Status: DC

## 2017-01-03 NOTE — Progress Notes (Signed)
Updated report received via [place] and Otila Kluver RN in patient's room using SBAR format, reviewed new orders and events of the day. Patient has a unit of PRBC's running with no s/s of distress noted, assumed care of patient.

## 2017-01-03 NOTE — Progress Notes (Addendum)
ANTICOAGULATION CONSULT NOTE - Follow Up  Pharmacy Consult:  Coumadin Indication:  History of PE  Allergies  Allergen Reactions  . Oxycodone Hcl Nausea Only  . Vicodin [Hydrocodone-Acetaminophen] Nausea And Vomiting    Patient Measurements: Height: 5\' 8"  (172.7 cm) Weight: (!) 324 lb 11.2 oz (147.3 kg) IBW/kg (Calculated) : 63.9  Vital Signs: Temp: 98.8 F (37.1 C) (08/21 0428) Temp Source: Oral (08/21 0428) BP: 137/98 (08/21 0428) Pulse Rate: 89 (08/21 0428)  Labs:  Recent Labs  01/01/17 0934 01/02/17 0520 01/03/17 0802 01/03/17 0907  HGB 8.3* 7.0*  --  9.6*  HCT 25.0* 20.6*  --  27.2*  PLT 240 253  --  338  LABPROT 53.5* 47.6* 52.1*  --   INR 5.75* 4.97* 5.56*  --   CREATININE 2.28* 1.62* 1.26*  1.28*  --     Estimated Creatinine Clearance: 87.5 mL/min (A) (by C-G formula based on SCr of 1.26 mg/dL (H)).   Assessment: 46 y.o. female presented with dyspnea and chest pain, taking Coumadin PTA for history of PE. INR remains supratherapeutic, increased to 5.56 with warfarin on hold. Last dose warfarin 12/29/2016. Pt has been on CTX, which may be contributing to elevated INR - now transitioned to Keflex. Pt with moderate PO intake. CBC low but stable. No bleeding reported.  MD ordered vitamin K this AM.  PTA warfarin dose: 12.5mg  PO daily Admit INR: 2.96  Goal of Therapy:  INR 2-3 Monitor platelets by anticoagulation protocol: Yes   Plan:  Continue to hold Coumadin Daily INR Monitor for s/sx bleeding  Elicia Lamp, PharmD, BCPS Clinical Pharmacist Rx Phone # for today: [phone] After 3:30PM, please call Main Rx: #[id number] 01/03/2017 9:54 AM

## 2017-01-03 NOTE — Progress Notes (Signed)
PT Cancellation Note  Patient Details Name: Alice [name] MRN: [id number] DOB: May 09, 1971   Cancelled Treatment:    Reason Eval/Treat Not Completed: Other (comment). Pt on BSC and then to eat her breakfast. Will check back after breakfast.   Shary Decamp [facility] 01/03/2017, 9:28 AM Suanne Marker PT [phone]

## 2017-01-03 NOTE — Progress Notes (Signed)
CRITICAL VALUE ALERT  Critical Value:  INR 5.56  Date & Time Notied:  01/03/17 9:30  Provider Notified: RAI  Orders Received/Actions taken: Order for Phytonadione. Monitor for S & S for bleeding.   Saddie Benders RN

## 2017-01-03 NOTE — Progress Notes (Signed)
The patient has been given his discharge instructions with a new medication list and what to take today. She has paper prescriptions and follow up appointments. She is discharging with her sister via car.   Saddie Benders RN

## 2017-01-03 NOTE — Care Management Note (Signed)
Case Management Note  Patient Details  Name: Alice [name] MRN: [id number] Date of Birth: [birth year]  Subjective/Objective:     Pt admitted on 12/28/16 with sepsis secondary to UTI.  PTA, pt independent, lives with family members.                 Action/Plan: CM referral for PCP follow up at [facility].  Spoke with pt; she has PCP--Dr. Benito Mccreedy at [facility] on [facility].  Appointment made for September 4, at 11:00, and info put on AVS.    Expected Discharge Date:  01/03/17               Expected Discharge Plan:  Home/Self Care  In-House Referral:     Discharge planning Services  CM Consult, Follow-up appt scheduled  Post Acute Care Choice:    Choice offered to:     DME Arranged:    DME Agency:     HH Arranged:    HH Agency:     Status of Service:  Completed, signed off  If discussed at [employer] of Stay Meetings, dates discussed:    Additional Comments:  Reinaldo Raddle, RN, BSN  Trauma/Neuro ICU Case Manager [phone]

## 2017-01-03 NOTE — Discharge Summary (Signed)
Physician Discharge Summary   Patient ID: Alice [name] MRN: [id number] DOB/AGE: 22-Jan-1971 46 y.o.  Admit date: 12/28/2016 Discharge date: 01/03/2017  Primary Care Physician:  Benito Mccreedy, MD  Discharge Diagnoses:   . Acute lower UTI . Sepsis ([place])   Right pyelonephritis   Escherichia coli bacteremia . Acute kidney injury superimposed on CKD ([place]) . HTN (hypertension) . History of pulmonary embolism . OSA (obstructive sleep apnea) . CKD (chronic kidney disease), stage III . Microcytic anemia . 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 ([place]) . Atypical chest pain . Hypokalemia   Consults:  Cardiology Orthopedics, Dr. Sharol Given Infectious disease Nephrology  Recommendations for Outpatient Follow-up:  1. Please repeat CBC/BMET at next visit 2. Please check a PT/INR on 01/05/17, Coumadin currently on hold until INR therapeutic.   DIET: Heart healthy diet    Allergies:   Allergies  Allergen Reactions  . Oxycodone Hcl Nausea Only  . Vicodin [Hydrocodone-Acetaminophen] Nausea And Vomiting     DISCHARGE MEDICATIONS: Discharge Medication List as of 01/03/2017 12:35 PM    START taking these medications   Details  nitroGLYCERIN (NITROSTAT) 0.4 MG SL tablet Place 1 tablet (0.4 mg total) under the tongue every 5 (five) minutes x 3 doses as needed for chest pain., Starting Tue 01/03/2017, Print    nystatin (MYCOSTATIN) 100000 UNIT/ML suspension Take 5 mLs (500,000 Units total) by mouth 4 (four) times daily. X 1 week, Starting Tue 01/03/2017, Print    pantoprazole (PROTONIX) 40 MG tablet Take 1 tablet (40 mg total) by mouth daily. For acid reflux, Starting Tue 01/03/2017, Print    valACYclovir (VALTREX) 1000 MG tablet Take 2 tablets (2,000 mg total) by mouth every 12 (twelve) hours. For cold sores/fever blisters, Starting Tue 01/03/2017, Print      CONTINUE these medications which have CHANGED   Details  cephALEXin (KEFLEX) 500 MG capsule Take 1 capsule  (500 mg total) by mouth 3 (three) times daily. X 9 days, Starting Tue 01/03/2017, Print    sodium bicarbonate 650 MG tablet Take 2 tablets (1,300 mg total) by mouth 2 (two) times daily. X 3 weeks, Starting Tue 01/03/2017, Print    warfarin (COUMADIN) 2.5 MG tablet Take 5 tablets (12.5 mg total) by mouth one time only at 6 PM. HOLD coumadin until further instructions from your doctor, Starting Tue 01/03/2017, Print      CONTINUE these medications which have NOT CHANGED   Details  albuterol (PROVENTIL HFA;VENTOLIN HFA) 108 (90 Base) MCG/ACT inhaler Inhale 2-3 puffs into the lungs every 6 (six) hours as needed for wheezing or shortness of breath. , Historical Med    amLODipine (NORVASC) 10 MG tablet Take 1 tablet (10 mg total) by mouth daily., Starting Mon 05/20/2013, Print    buPROPion (WELLBUTRIN XL) 150 MG 24 hr tablet Take 150 mg by mouth daily., Historical Med    levonorgestrel (MIRENA) 20 MCG/24HR IUD 1 each by Intrauterine route once. , Historical Med      STOP taking these medications     hydrochlorothiazide (HYDRODIURIL) 25 MG tablet      ibuprofen (ADVIL,MOTRIN) 200 MG tablet      aspirin EC 81 MG EC tablet      Cholecalciferol (VITAMIN D3) 5000 units CAPS      gabapentin (NEURONTIN) 300 MG capsule      oxyCODONE (ROXICODONE) 5 MG immediate release tablet      potassium chloride SA (K-DUR,KLOR-CON) 20 MEQ tablet          Brief  H and P: For complete details please refer to admission H and P, but in brief Patient is a 46 year old female with history of DVT, PE on warfarin, chronic back pain, hypertension, OSA on CPAP intolerance, presented to ED with chest pain, shortness of breath. Patient reported the pain as sharp, severe, constant on both side of the chest with dyspnea. Chest x-ray showed no pneumonia. VQ scan negative for PE. Troponin +0.05. Patient was admitted to stepdown unit. UA positive for UTI.   Hospital Course:   Sepsis ([place]) - Escherichia coli bacteremia,  Escherichia coli UTI -Patient met sepsis criteria on admission fever103 F, tachycardia 104, hypotension 97/51 UA positive for UTI, also being treated for venous ulcer on the right lower extremity  - Initial renal ultrasound was negative for any pyelonephritis or renal abscess. - Dr. Sharol Given evaluated the venous ulcer on the right lower extremity and confirmed that it was clean and not the source for patient's sepsis  - Patient continued to spike fevers, blood cultures showed Escherichia coli, consistent with urine culture which also showed Escherichia coli. - CT abdomen was done which showed right-sided pyelonephritis but no renal abscess - Infectious disease was consulted and patient seen by Dr. Megan Salon Dr. Megan Salon, transitioned to oral cephalexin for 9 more days - Repeat blood cultures 8/17, Negative so far  : Acute UTI ([place]) -Urine cultures Escherichia coli, - transitioned to oral cephalexin For 9 more days  Atypical chest pain, new cardiomyopathy, elevated troponin - Unclear in etiology, patient reported chest pain with shortness of breath, worsens with inspiration or positional changes -  Given history of DVT, PE, VQ scan was obtained which was negative for pulmonary embolism. Ruled out for ACS. - Patient was followed by cardiology closely, recommended plan on stress test or coronary CTA once recovered from acute illness - Patient has a follow-up appointment with cardiology on 01/17/17, I have sent a message to the office and that is coinciding with her PCP appointment    Acute kidney injury superimposed on CKD, stage III ([place]) - Serum creatinine 2.5 on admission, baseline 1.1 - Possible prerenal, dehydration and recent vomiting, severe sepsis medications HCTZ prior to admission - Creatinine worsened and plateaued at 4.0 - Creatinine improved to 1.26 at the time of discharge.      hypotension  - Likely due to #1 sepsis, has a underlying history of hypertension - BP now improving, may  restart amlodipine but DC HCTZ    History of pulmonary embolism. History of DVT (deep vein thrombosis) - Continue Coumadin per pharmacy, INR supratherapeutic possibly due to sepsis - Repeat VQ scan showed no pulmonary embolism Patient was given vitamin K 2.5 mg by mouth prior to discharge. She was recommended to hold Coumadin and follow up with her PCP on 01/05/17 to get a repeat PT/INR check and further instructions about Coumadin dosing.    OSA (obstructive sleep apnea) - Continue CPAP  Hypokalemia -Continue scheduled potassium replacement    Microcytic anemia - Fe 7, saturation ratio 2, suggesting microcytic anemia however patient has B12 deficiency as well. B12 level 173 - Hemoglobin 9.6 at the time of discharge.    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 Sharol Given evaluated the wound, currently clean  Obesity Morbid, BMI 51.8 Patient recommended diet and weight control    Day of Discharge BP (!) 137/98 (BP Location: Right Arm)   Pulse 90   Temp 98.8 F (37.1 C) (Oral)   Resp (!) 29  Ht _0  (1.727 m)   Wt (!) 147.3 kg (324 lb 11.2 oz)   SpO2 97%   BMI 49.37 kg/m   Physical Exam: General: Alert and awake oriented x3 not in any acute distress. HEENT: anicteric sclera, pupils reactive to light and accommodation CVS: S1-S2 clear no murmur rubs or gallops Chest: clear to auscultation bilaterally, no wheezing rales or rhonchi Abdomen: soft nontender, nondistended, normal bowel sounds Extremities: Wound on the right lower extremity  Neuro: Cranial nerves II-XII intact, no focal neurological deficits   The results of significant diagnostics from this hospitalization (including imaging, microbiology, ancillary and laboratory) are listed below for reference.    LAB RESULTS: Basic Metabolic Panel:  Recent Labs Lab 12/29/16 1440  01/02/17 0520 01/03/17 0802  NA 137  < > 138 140  141  K 3.1*  < > 3.4* 3.7  3.7  CL 108  < > 109 109   110  CO2 21*  < > 20* 21*  20*  GLUCOSE 117*  < > 88 93  92  BUN 38*  < > _1 CREATININE 3.81*  < > 1.62* 1.26*  1.28*  CALCIUM 7.8*  < > 8.0* 8.4*  8.3*  MG 1.8  --   --   --   PHOS  --   < > 1.9* 2.7  < > = values in this interval not displayed. Liver Function Tests:  Recent Labs Lab 01/02/17 0520 01/03/17 0802  ALBUMIN 2.1* 2.2*   No results for input(s): LIPASE, AMYLASE in the last 168 hours. No results for input(s): AMMONIA in the last 168 hours. CBC:  Recent Labs Lab 01/02/17 0520 01/03/17 0907  WBC 13.0* 14.6*  HGB 7.0* 9.6*  HCT 20.6* 27.2*  MCV 63.8* 66.8*  PLT 253 338   Cardiac Enzymes:  Recent Labs Lab 12/29/16 0210 12/29/16 1440  TROPONINI 0.05* 0.05*   BNP: Invalid input(s): POCBNP CBG:  Recent Labs Lab 01/02/17 2202 01/03/17 0855  GLUCAP 119* 86    Significant Diagnostic Studies:  US Renal  Result Date: 2017/01/06 CLINICAL DATA:  Acute kidney injury, pain in chills for 2 days EXAM: RENAL / URINARY TRACT ULTRASOUND COMPLETE COMPARISON:  05/02/2015, 05/17/2013 FINDINGS: Right Kidney: Length: 10.9 cm. Echogenicity within normal limits. No mass or hydronephrosis visualized. Left Kidney: Length: 11.7 cm. Echogenicity within normal limits. No mass or hydronephrosis visualized. Bladder: Appears normal for degree of bladder distention. IMPRESSION: Negative renal ultrasound Electronically Signed   By: Donavan Foil M.D.   On: 01/06/2017 22:05   Nm Pulmonary Vent And Perf (v/q Scan)  Result Date: 06-Jan-2017 CLINICAL DATA:  Chest pain and shortness of Breath EXAM: NUCLEAR MEDICINE VENTILATION - PERFUSION LUNG SCAN TECHNIQUE: Ventilation images were obtained in multiple projections using inhaled aerosol Tc-7mDTPA. Perfusion images were obtained in multiple projections after intravenous injection of Tc-997mAA. RADIOPHARMACEUTICALS:  31.6 mCi Technetium-9910mPA aerosol inhalation and 4.24 mCi Technetium-23m58m IV COMPARISON:  Chest x-ray  from earlier in the same day. FINDINGS: Ventilation: No focal ventilation defect. Perfusion: Lateral imaging could not be performed due the patient's size and cold for although the remaining perfusion images show no significant perfusion defect. IMPRESSION: No evidence of pulmonary embolism. Electronically Signed   By: MarkInez Catalina.   On: 08/108-24-201840   Dg Chest Portable 1 View  Result Date: 12/1506-24-2018NICAL DATA:  Shortness of breath and chest pain EXAM: PORTABLE CHEST 1 VIEW COMPARISON:  April 19, 2015 FINDINGS: Lungs are  clear. Heart is borderline enlarged with pulmonary vascularity within normal limits. No adenopathy. No pneumothorax. No bone lesions. IMPRESSION: Borderline cardiac enlargement.  No edema or consolidation. Electronically Signed   By: Lowella Grip III M.D.   On: 12/28/2016 17:09    2D ECHO: Study Conclusions  - Left ventricle: The cavity size was normal. Wall thickness was   normal. Systolic function was mildly reduced. The estimated   ejection fraction was in the range of 45% to 50%. Mild diffuse   hypokinesis with no identifiable regional variations. Early   diastolic septal annular tissue Doppler velocities Ea were   abnormally low when adjusted for age. - Left atrium: The atrium was mildly dilated. - Right ventricle: The cavity size was moderately dilated. - Right atrium: The atrium was moderately dilated.  Disposition and Follow-up: Discharge Instructions    Diet - low sodium heart healthy    Complete by:  As directed    Discharge instructions    Complete by:  As directed    HOLD coumadin until further instructions from your doctor. Please get PT/INR check on 01/05/17 (Thursday) at your doctor's office and follow further instructions when to restart your Coumadin again and what dose.   Increase activity slowly    Complete by:  As directed        DISPOSITION: home    [place]    [name], MD. Go on  01/10/2017.   Specialty:  Orthopedic Surgery Why:  2:45 pm  Contact information: [place] [address] [phone]        Dorothy Spark, MD. Go on 01/17/2017.   Specialty:  Cardiology Why:  Will see PA Dunn 10:30am Contact information: [address] [place] [id number] [phone]        Osei Bonsu,George Follow up on 01/17/2017.   Why:  11:00 AM;  Please bring all medications you are taking and copy of discharge instructions.   Contact information: [facility] [place], [place]           Time spent on Discharge: 35 minutes  Signed:   Estill Cotta M.D. [facility] 01/03/2017, 2:16 PM Pager: [phone]

## 2017-01-03 NOTE — Evaluation (Signed)
Physical Therapy Evaluation Patient Details Name: Alice [name] MRN: [id number] DOB: 02/23/1971 Today's Date: 01/03/2017   History of Present Illness  Pt adm with sepsis due to acute UTI. PMH - anemia, bronchitis, dvt, pe, htn, back pain, venous stasis ulcers with skin graft  Clinical Impression  Pt doing well with mobility and no further PT needed.  Ready for dc from PT standpoint.      Follow Up Recommendations No PT follow up    Equipment Recommendations  None recommended by PT    Recommendations for Other Services       Precautions / Restrictions Precautions Precautions: None Restrictions Weight Bearing Restrictions: No      Mobility  Bed Mobility Overal bed mobility: Modified Independent             General bed mobility comments: Incr time  Transfers Overall transfer level: Modified independent Equipment used: None             General transfer comment: Incr time  Ambulation/Gait Ambulation/Gait assistance: Modified independent (Device/Increase time) Ambulation Distance (Feet): 20 Feet Assistive device: None Gait Pattern/deviations: Step-through pattern;Decreased stride length;Wide base of support Gait velocity: decr Gait velocity interpretation: Below normal speed for age/gender General Gait Details: Steady gait. Limited distance per pt request  Stairs            Wheelchair Mobility    Modified Rankin (Stroke Patients Only)       Balance Overall balance assessment: No apparent balance deficits (not formally assessed)                                           Pertinent Vitals/Pain Pain Assessment: No/denies pain    Home Living Family/patient expects to be discharged to:: Private residence Living Arrangements: Other relatives Available Help at Discharge: Family Type of Home: Apartment Home Access: Stairs to enter Entrance Stairs-Rails: Left Entrance Stairs-Number of Steps: 3 Home Layout: One level Home  Equipment: Environmental consultant - 2 wheels      Prior Function Level of Independence: Independent               Hand Dominance        Extremity/Trunk Assessment   Upper Extremity Assessment Upper Extremity Assessment: Overall WFL for tasks assessed    Lower Extremity Assessment Lower Extremity Assessment: Overall WFL for tasks assessed       Communication   Communication: No difficulties  Cognition Arousal/Alertness: Awake/alert Behavior During Therapy: WFL for tasks assessed/performed Overall Cognitive Status: Within Functional Limits for tasks assessed                                        General Comments      Exercises     Assessment/Plan    PT Assessment Patent does not need any further PT services  PT Problem List         PT Treatment Interventions      PT Goals (Current goals can be found in the Care Plan section)  Acute Rehab PT Goals PT Goal Formulation: All assessment and education complete, DC therapy    Frequency     Barriers to discharge        Co-evaluation               AM-PAC PT "6 Clicks"  Daily Activity  Outcome Measure Difficulty turning over in bed (including adjusting bedclothes, sheets and blankets)?: None Difficulty moving from lying on back to sitting on the side of the bed? : None Difficulty sitting down on and standing up from a chair with arms (e.g., wheelchair, bedside commode, etc,.)?: None Help needed moving to and from a bed to chair (including a wheelchair)?: None Help needed walking in hospital room?: None Help needed climbing 3-5 steps with a railing? : None 6 Click Score: 24    End of Session   Activity Tolerance: Patient tolerated treatment well Patient left: in chair;with call bell/phone within reach (set up for bath) Nurse Communication: Mobility status PT Visit Diagnosis: Unsteadiness on feet (R26.81)    Time: 1020-1041 PT Time Calculation (min) (ACUTE ONLY): 21 min   Charges:   PT  Evaluation $PT Eval Low Complexity: 1 Low     PT G CodesMarland Kitchen        [facility] PT [place] 01/03/2017, 10:49 AM

## 2017-01-04 LAB — CULTURE, BLOOD (ROUTINE X 2)
Culture: NO GROWTH
Culture: NO GROWTH
SPECIAL REQUESTS: ADEQUATE
Special Requests: ADEQUATE

## 2017-01-05 ENCOUNTER — Encounter (INDEPENDENT_AMBULATORY_CARE_PROVIDER_SITE_OTHER): Payer: Self-pay | Admitting: Family

## 2017-01-05 ENCOUNTER — Ambulatory Visit (INDEPENDENT_AMBULATORY_CARE_PROVIDER_SITE_OTHER): Payer: Medicare Other | Admitting: Family

## 2017-01-05 VITALS — Ht 68.0 in | Wt 324.0 lb

## 2017-01-05 DIAGNOSIS — I87331 Chronic venous hypertension (idiopathic) with ulcer and inflammation of right lower extremity: Secondary | ICD-10-CM

## 2017-01-05 DIAGNOSIS — Z945 Skin transplant status: Secondary | ICD-10-CM

## 2017-01-05 DIAGNOSIS — L97919 Non-pressure chronic ulcer of unspecified part of right lower leg with unspecified severity: Principal | ICD-10-CM

## 2017-01-06 DIAGNOSIS — F329 Major depressive disorder, single episode, unspecified: Secondary | ICD-10-CM | POA: Diagnosis not present

## 2017-01-06 DIAGNOSIS — I10 Essential (primary) hypertension: Secondary | ICD-10-CM | POA: Diagnosis not present

## 2017-01-06 DIAGNOSIS — G629 Polyneuropathy, unspecified: Secondary | ICD-10-CM | POA: Diagnosis not present

## 2017-01-06 DIAGNOSIS — R7303 Prediabetes: Secondary | ICD-10-CM | POA: Diagnosis not present

## 2017-01-06 DIAGNOSIS — G4733 Obstructive sleep apnea (adult) (pediatric): Secondary | ICD-10-CM | POA: Diagnosis not present

## 2017-01-06 DIAGNOSIS — E559 Vitamin D deficiency, unspecified: Secondary | ICD-10-CM | POA: Diagnosis not present

## 2017-01-06 DIAGNOSIS — R1013 Epigastric pain: Secondary | ICD-10-CM | POA: Diagnosis not present

## 2017-01-06 DIAGNOSIS — Z7901 Long term (current) use of anticoagulants: Secondary | ICD-10-CM | POA: Diagnosis not present

## 2017-01-06 NOTE — Progress Notes (Signed)
Office Visit Note   Patient: Alice [name]           Date of Birth: 1970-12-09           MRN: [id number] Visit Date: 01/05/2017              Requested by: Benito Mccreedy, MD [address] [place], [place] [address] PCP: Benito Mccreedy, MD  Chief Complaint  Patient presents with  . Right Leg - Wound Check      HPI: Patient presents in follow-up for venous insufficiency ulcer right leg. Has been wrapped twice weekly with Dynaflex compressive wrap with the Vive sock material against the wound. Did have a hospitalization last week for a UTI. While hospitalized a dry dressing was placed. This is removed today in clinic, has been in place for about 6 days.   Assessment & Plan: Visit Diagnoses:  1. 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 Hx of skin graft     Plan: We will resume wound care with the sock against the wound and the Dynaflex wraps changing this twice a week. Plan to follow-up twice weekly.  Follow-Up Instructions: No Follow-up on file.   Ortho Exam  Patient is alert, oriented, no adenopathy, well-dressed, normal affect, normal respiratory effort. Examination patient has had development of exudative tissue to the ulcer, about 60%. Remainder is granulation tissue. The wound is flat there are no rolled up skin edges. There is superficial epithelialization around the wound, has filled in about 4 mm. There is no odor no drainage no signs of infection. there is no cellulitis.  Imaging: No results found.  Labs: Lab Results  Component Value Date   REPTSTATUS 01/04/2017 FINAL 12/30/2016   REPTSTATUS 01/04/2017 FINAL 12/30/2016   GRAMSTAIN  05/17/2013    MODERATE WBC PRESENT, PREDOMINANTLY MONONUCLEAR MODERATE GRAM POSITIVE COCCI IN PAIRS IN CHAINS RARE GRAM NEGATIVE RODS Gram Stain Report Called to,Read Back By and Verified With: HOPPER,M RN @ [phone] 05/17/13 LEONARD,A   GRAMSTAIN  05/17/2013      MODERATE WBC PRESENT, PREDOMINANTLY MONONUCLEAR MODERATE SQUAMOUS EPITHELIAL CELLS PRESENT MODERATE GRAM POSITIVE COCCI IN PAIRS IN CHAINS RARE GRAM NEGATIVE RODS Performed at [facility]   CULT NO GROWTH 5 DAYS 12/30/2016   CULT NO GROWTH 5 DAYS 12/30/2016   LABORGA ESCHERICHIA COLI (A) 12/29/2016    Orders:  No orders of the defined types were placed in this encounter.  No orders of the defined types were placed in this encounter.    Procedures: No procedures performed  Clinical Data: No additional findings.  ROS:  All other systems negative, except as noted in the HPI. Review of Systems  Constitutional: Negative for chills and fever.  Cardiovascular: Positive for leg swelling.  Skin: Positive for wound. Negative for color change.    Objective: Vital Signs: Ht 5\' 8"  (1.727 m)   Wt (!) 324 lb (147 kg)   BMI 49.26 kg/m   Specialty Comments:  No specialty comments available.  PMFS History: Patient Active Problem List   Diagnosis Date Noted  . Acute lower UTI 12/29/2016  . Sepsis ([place]) 12/29/2016  . Acute kidney injury superimposed on CKD ([place]) 12/28/2016  . Atypical chest pain 12/28/2016  . Hx of skin graft 08/01/2016  . 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 ([place]) 07/25/2016  . 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  Trichomonal infection 11/24/2015  . Abdominal pain 05/02/2015  . Symptomatic cholelithiasis 04/16/2015  . Acute bilateral upper abdominal pain 04/16/2015  . Microcytic anemia 05/18/2013  . Hypotension, unspecified 05/17/2013  . CKD (chronic kidney disease), stage III 05/17/2013  . Hypokalemia 05/17/2013  . Anal fissure 02/06/2013  . Anal skin tag 02/06/2013  . ALLERGIC RHINITIS 03/05/2010  . LOW BACK PAIN SYNDROME 12/13/2007  . WOUND INFECTION 10/10/2007  . OBESITY, MORBID 12/02/2006  . HTN (hypertension) 12/02/2006  . History of  pulmonary embolism 12/02/2006  . History of DVT (deep vein thrombosis) 12/02/2006  . 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 GASTROESOPHAGEAL REFLUX DISEASE 12/02/2006  . OSA (obstructive sleep apnea) 12/02/2006   Past Medical History:  Diagnosis Date  . Anemia   . Anxiety   . Chronic bronchitis ([place])   . Chronic upper back pain   . Depression   . DVT (deep venous thrombosis) (HCC)    BLE  . GERD (gastroesophageal reflux disease)   . Headache    "weekly" (04/16/2015)  . Hypertension   . Migraine    "monthly" (04/16/2015)  . Obesity   . Pneumonia 2014  . Pulmonary embolism ([place])   . Sinusitis nasal   . Sleep apnea    "I'm suppose to wear a mask but I don't" (04/16/2015)  . Varicose veins of right lower extremity   . Venous stasis of lower extremity    right    Family History  Problem Relation Age of Onset  . Kidney disease Mother        kidney transplant    Past Surgical History:  Procedure Laterality Date  . CHOLECYSTECTOMY N/A 04/18/2015   Procedure: LAPAROSCOPIC CHOLECYSTECTOMY;  Surgeon: Ralene Ok, MD;  Location: [facility];  Service: General;  Laterality: N/A;  . HYSTEROSCOPY W/D&C  12/24/2001   Archie Endo 09/28/2010  . I&D EXTREMITY Right 07/25/2016   Procedure: IRRIGATION AND DEBRIDEMENT RIGHT LEG ULCER, APPLY VERAFLO VAC;  Surgeon: [name], MD;  Location: [facility];  Service: Orthopedics;  Laterality: Right;  . INCISE AND DRAIN ABCESS Right 07/14/2016  . INCISION AND DRAINAGE Right 09/10/2008   leg:  skin and soft tissue and muscle/notes 09/15/2010  . INCISION AND DRAINAGE Right 01/01/2008   Chronic venous stasis insufficiency ulcer,/notes 09/14/2010/  . INCISION AND DRAINAGE Right 10/6292   calf w/application wound vac/notes 07/20/2010  . INCISION AND DRAINAGE Right 07/25/2016   IRRIGATION AND DEBRIDEMENT RIGHT LEG ULCER,  . LAPAROSCOPIC GASTRIC BYPASS  ~ 2007  . SKIN GRAFT SPLIT THICKNESS LEG / FOOT Right 07/25/2016   LEG  . SKIN SPLIT GRAFT Right  07/27/2016   Procedure: SKIN GRAFT RIGHT LEG WITH THERASKIN APPLICATION;  Surgeon: [name], MD;  Location: [facility];  Service: Orthopedics;  Laterality: Right;  . TONSILLECTOMY     Social History   Occupational History  . Not on file.   Social History Main Topics  . Smoking status: Never Smoker  . Smokeless tobacco: Never Used  . Alcohol use No  . Drug use: No  . Sexual activity: Yes    Partners: Male    Birth control/ protection: IUD

## 2017-01-10 ENCOUNTER — Encounter (INDEPENDENT_AMBULATORY_CARE_PROVIDER_SITE_OTHER): Payer: Self-pay | Admitting: Orthopedic Surgery

## 2017-01-10 ENCOUNTER — Ambulatory Visit (INDEPENDENT_AMBULATORY_CARE_PROVIDER_SITE_OTHER): Payer: Medicare Other | Admitting: Orthopedic Surgery

## 2017-01-10 DIAGNOSIS — I87331 Chronic venous hypertension (idiopathic) with ulcer and inflammation of right lower extremity: Secondary | ICD-10-CM

## 2017-01-10 DIAGNOSIS — L97919 Non-pressure chronic ulcer of unspecified part of right lower leg with unspecified severity: Principal | ICD-10-CM

## 2017-01-10 NOTE — Progress Notes (Signed)
Office Visit Note   Patient: Alice [name]           Date of Birth: Jun 19, 1970           MRN: [id number] Visit Date: 01/10/2017              Requested by: No referring provider defined for this encounter. PCP: Benito Mccreedy, MD  Chief Complaint  Patient presents with  . Right Leg - Wound Check      HPI: Patient presents for follow-up for her venous stasis ulcer right lower extremity. She was recently hospitalized for pneumonia she received blood transfusion and also was taken off her fluid pill secondary to worsening renal function. Patient complains of increasing swelling in her extremities.  Assessment & Plan: Visit Diagnoses:  1. 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 Follow up in the office twice a week for dressing changes.  Follow-Up Instructions: Return in about 1 week (around 01/17/2017).   Ortho Exam  Patient is alert, oriented, no adenopathy, well-dressed, normal affect, normal respiratory effort. Patient has antalgic gait. On examination the ulcer is 4 x 9 cm and 2 mm deep with 100% beefy granulation tissue she has swelling in her foot there is no cellulitis no odor no signs of infection.  Imaging: No results found. No images are attached to the encounter.  Labs: Lab Results  Component Value Date   REPTSTATUS 01/04/2017 FINAL 12/30/2016   REPTSTATUS 01/04/2017 FINAL 12/30/2016   GRAMSTAIN  05/17/2013    MODERATE WBC PRESENT, PREDOMINANTLY MONONUCLEAR MODERATE GRAM POSITIVE COCCI IN PAIRS IN CHAINS RARE GRAM NEGATIVE RODS Gram Stain Report Called to,Read Back By and Verified With: HOPPER,M RN @ [phone] 05/17/13 LEONARD,A   GRAMSTAIN  05/17/2013    MODERATE WBC PRESENT, PREDOMINANTLY MONONUCLEAR MODERATE SQUAMOUS EPITHELIAL CELLS PRESENT MODERATE GRAM POSITIVE COCCI IN PAIRS IN CHAINS RARE  GRAM NEGATIVE RODS Performed at [facility]   CULT NO GROWTH 5 DAYS 12/30/2016   CULT NO GROWTH 5 DAYS 12/30/2016   LABORGA ESCHERICHIA COLI (A) 12/29/2016    Orders:  No orders of the defined types were placed in this encounter.  No orders of the defined types were placed in this encounter.    Procedures: No procedures performed  Clinical Data: No additional findings.  ROS:  All other systems negative, except as noted in the HPI. Review of Systems  Objective: Vital Signs: There were no vitals taken for this visit.  Specialty Comments:  No specialty comments available.  PMFS History: Patient Active Problem List   Diagnosis Date Noted  . Acute lower UTI 12/29/2016  . Sepsis ([place]) 12/29/2016  . Acute kidney injury superimposed on CKD ([place]) 12/28/2016  . Atypical chest pain 12/28/2016  . Hx of skin graft 08/01/2016  . 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 ([place]) 07/25/2016  . 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 Trichomonal infection 11/24/2015  . Abdominal pain 05/02/2015  . Symptomatic cholelithiasis 04/16/2015  . Acute bilateral upper abdominal pain 04/16/2015  . Microcytic anemia 05/18/2013  . Hypotension, unspecified 05/17/2013  . CKD (chronic kidney disease), stage III 05/17/2013  . Hypokalemia 05/17/2013  . Anal fissure 02/06/2013  . Anal skin tag 02/06/2013  . ALLERGIC RHINITIS 03/05/2010  . LOW BACK PAIN SYNDROME 12/13/2007  .  WOUND INFECTION 10/10/2007  . OBESITY, MORBID 12/02/2006  . HTN (hypertension) 12/02/2006  . History of pulmonary embolism 12/02/2006  . History of DVT (deep vein thrombosis) 12/02/2006  . 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 GASTROESOPHAGEAL REFLUX DISEASE 12/02/2006  . OSA (obstructive sleep apnea) 12/02/2006   Past Medical History:  Diagnosis Date  . Anemia   . Anxiety   . Chronic bronchitis ([place])   .  Chronic upper back pain   . Depression   . DVT (deep venous thrombosis) ([facility])    BLE  . GERD (gastroesophageal reflux disease)   . Headache    "weekly" (04/16/2015)  . Hypertension   . Migraine    "monthly" (04/16/2015)  . Obesity   . Pneumonia 2014  . Pulmonary embolism ([place])   . Sinusitis nasal   . Sleep apnea    "I'm suppose to wear a mask but I don't" (04/16/2015)  . Varicose veins of right lower extremity   . Venous stasis of lower extremity    right    Family History  Problem Relation Age of Onset  . Kidney disease Mother        kidney transplant    Past Surgical History:  Procedure Laterality Date  . CHOLECYSTECTOMY N/A 04/18/2015   Procedure: LAPAROSCOPIC CHOLECYSTECTOMY;  Surgeon: Ralene Ok, MD;  Location: [place];  Service: General;  Laterality: N/A;  . HYSTEROSCOPY W/D&C  12/24/2001   Archie Endo 09/28/2010  . I&D EXTREMITY Right 07/25/2016   Procedure: IRRIGATION AND DEBRIDEMENT RIGHT LEG ULCER, APPLY VERAFLO VAC;  Surgeon: [name], MD;  Location: [place];  Service: Orthopedics;  Laterality: Right;  . INCISE AND DRAIN ABCESS Right 07/14/2016  . INCISION AND DRAINAGE Right 09/10/2008   leg:  skin and soft tissue and muscle/notes 09/15/2010  . INCISION AND DRAINAGE Right 01/01/2008   Chronic venous stasis insufficiency ulcer,/notes 09/14/2010/  . INCISION AND DRAINAGE Right 0/3500   calf w/application wound vac/notes 07/20/2010  . INCISION AND DRAINAGE Right 07/25/2016   IRRIGATION AND DEBRIDEMENT RIGHT LEG ULCER,  . LAPAROSCOPIC GASTRIC BYPASS  ~ 2007  . SKIN GRAFT SPLIT THICKNESS LEG / FOOT Right 07/25/2016   LEG  . SKIN SPLIT GRAFT Right 07/27/2016   Procedure: SKIN GRAFT RIGHT LEG WITH THERASKIN APPLICATION;  Surgeon: [name], MD;  Location: [place];  Service: Orthopedics;  Laterality: Right;  . TONSILLECTOMY     Social History   Occupational History  . Not on file.   Social History Main Topics  . Smoking status: Never Smoker  . Smokeless tobacco: Never  Used  . Alcohol use No  . Drug use: No  . Sexual activity: Yes    Partners: Male    Birth control/ protection: IUD

## 2017-01-12 ENCOUNTER — Ambulatory Visit (INDEPENDENT_AMBULATORY_CARE_PROVIDER_SITE_OTHER): Payer: Medicare Other | Admitting: Family

## 2017-01-12 DIAGNOSIS — L97919 Non-pressure chronic ulcer of unspecified part of right lower leg with unspecified severity: Principal | ICD-10-CM

## 2017-01-12 DIAGNOSIS — I87331 Chronic venous hypertension (idiopathic) with ulcer and inflammation of right lower extremity: Secondary | ICD-10-CM

## 2017-01-12 NOTE — Progress Notes (Signed)
Office Visit Note   Patient: Alice [name]           Date of Birth: August 15, 1970           MRN: [id number] Visit Date: 01/12/2017              Requested by: Benito Mccreedy, MD [facility] [address] [place], [place] [address] PCP: Benito Mccreedy, MD  Chief Complaint  Patient presents with  . Right Leg - Wound Check, Routine Post Op      HPI: Patient presents for follow-up for her venous stasis ulcer right lower extremity.   Assessment & Plan: Visit Diagnoses:  1. 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 Follow up in the office twice a week for dressing changes.  Follow-Up Instructions: No Follow-up on file.   Ortho Exam  Patient is alert, oriented, no adenopathy, well-dressed, normal affect, normal respiratory effort. Patient has antalgic gait. On examination the ulcer is stable and 2 mm deep with 100% beefy granulation tissue. Anteriorly has about 5 mm of epithelialization. she has swelling in her foot there is no cellulitis no odor no signs of infection.  Imaging: No results found. No images are attached to the encounter.  Labs: Lab Results  Component Value Date   REPTSTATUS 01/04/2017 FINAL 12/30/2016   REPTSTATUS 01/04/2017 FINAL 12/30/2016   GRAMSTAIN  05/17/2013    MODERATE WBC PRESENT, PREDOMINANTLY MONONUCLEAR MODERATE GRAM POSITIVE COCCI IN PAIRS IN CHAINS RARE GRAM NEGATIVE RODS Gram Stain Report Called to,Read Back By and Verified With: HOPPER,M RN @ [phone] 05/17/13 LEONARD,A   GRAMSTAIN  05/17/2013    MODERATE WBC PRESENT, PREDOMINANTLY MONONUCLEAR MODERATE SQUAMOUS EPITHELIAL CELLS PRESENT MODERATE GRAM POSITIVE COCCI IN PAIRS IN CHAINS RARE GRAM NEGATIVE RODS Performed at [facility]   CULT NO GROWTH 5 DAYS 12/30/2016   CULT NO GROWTH 5 DAYS 12/30/2016   LABORGA  ESCHERICHIA COLI (A) 12/29/2016    Orders:  No orders of the defined types were placed in this encounter.  No orders of the defined types were placed in this encounter.    Procedures: No procedures performed  Clinical Data: No additional findings.  ROS:  All other systems negative, except as noted in the HPI. Review of Systems  Constitutional: Negative for chills and fever.  Cardiovascular: Positive for leg swelling.  Skin: Positive for wound. Negative for color change.    Objective: Vital Signs: There were no vitals taken for this visit.  Specialty Comments:  No specialty comments available.  PMFS History: Patient Active Problem List   Diagnosis Date Noted  . Acute lower UTI 12/29/2016  . Sepsis ([place]) 12/29/2016  . Acute kidney injury superimposed on CKD ([place]) 12/28/2016  . Atypical chest pain 12/28/2016  . Hx of skin graft 08/01/2016  . 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 ([place]) 07/25/2016  . 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 Trichomonal infection 11/24/2015  . Abdominal pain 05/02/2015  . Symptomatic cholelithiasis 04/16/2015  . Acute bilateral upper abdominal pain 04/16/2015  . Microcytic anemia 05/18/2013  . Hypotension, unspecified 05/17/2013  . CKD (chronic kidney disease), stage III 05/17/2013  . Hypokalemia 05/17/2013  . Anal fissure 02/06/2013  . Anal skin tag 02/06/2013  . ALLERGIC RHINITIS 03/05/2010  . LOW BACK PAIN  SYNDROME 12/13/2007  . WOUND INFECTION 10/10/2007  . OBESITY, MORBID 12/02/2006  . HTN (hypertension) 12/02/2006  . History of pulmonary embolism 12/02/2006  . History of DVT (deep vein thrombosis) 12/02/2006  . 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 GASTROESOPHAGEAL REFLUX DISEASE 12/02/2006  . OSA (obstructive sleep apnea) 12/02/2006   Past Medical History:  Diagnosis Date  . Anemia   . Anxiety   . Chronic  bronchitis ([place])   . Chronic upper back pain   . Depression   . DVT (deep venous thrombosis) ([facility])    BLE  . GERD (gastroesophageal reflux disease)   . Headache    "weekly" (04/16/2015)  . Hypertension   . Migraine    "monthly" (04/16/2015)  . Obesity   . Pneumonia 2014  . Pulmonary embolism ([place])   . Sinusitis nasal   . Sleep apnea    "I'm suppose to wear a mask but I don't" (04/16/2015)  . Varicose veins of right lower extremity   . Venous stasis of lower extremity    right    Family History  Problem Relation Age of Onset  . Kidney disease Mother        kidney transplant    Past Surgical History:  Procedure Laterality Date  . CHOLECYSTECTOMY N/A 04/18/2015   Procedure: LAPAROSCOPIC CHOLECYSTECTOMY;  Surgeon: Ralene Ok, MD;  Location: [place];  Service: General;  Laterality: N/A;  . HYSTEROSCOPY W/D&C  12/24/2001   Archie Endo 09/28/2010  . I&D EXTREMITY Right 07/25/2016   Procedure: IRRIGATION AND DEBRIDEMENT RIGHT LEG ULCER, APPLY VERAFLO VAC;  Surgeon: [name], MD;  Location: [place];  Service: Orthopedics;  Laterality: Right;  . INCISE AND DRAIN ABCESS Right 07/14/2016  . INCISION AND DRAINAGE Right 09/10/2008   leg:  skin and soft tissue and muscle/notes 09/15/2010  . INCISION AND DRAINAGE Right 01/01/2008   Chronic venous stasis insufficiency ulcer,/notes 09/14/2010/  . INCISION AND DRAINAGE Right 10/7701   calf w/application wound vac/notes 07/20/2010  . INCISION AND DRAINAGE Right 07/25/2016   IRRIGATION AND DEBRIDEMENT RIGHT LEG ULCER,  . LAPAROSCOPIC GASTRIC BYPASS  ~ 2007  . SKIN GRAFT SPLIT THICKNESS LEG / FOOT Right 07/25/2016   LEG  . SKIN SPLIT GRAFT Right 07/27/2016   Procedure: SKIN GRAFT RIGHT LEG WITH THERASKIN APPLICATION;  Surgeon: [name], MD;  Location: [place];  Service: Orthopedics;  Laterality: Right;  . TONSILLECTOMY     Social History   Occupational History  . Not on file.   Social History Main Topics  . Smoking status: Never Smoker  .  Smokeless tobacco: Never Used  . Alcohol use No  . Drug use: No  . Sexual activity: Yes    Partners: Male    Birth control/ protection: IUD

## 2017-01-17 ENCOUNTER — Ambulatory Visit: Payer: Medicare Other | Admitting: Physician Assistant

## 2017-01-17 ENCOUNTER — Encounter (INDEPENDENT_AMBULATORY_CARE_PROVIDER_SITE_OTHER): Payer: Self-pay | Admitting: Radiology

## 2017-01-17 ENCOUNTER — Ambulatory Visit (INDEPENDENT_AMBULATORY_CARE_PROVIDER_SITE_OTHER): Payer: Medicare Other | Admitting: Radiology

## 2017-01-17 DIAGNOSIS — I87331 Chronic venous hypertension (idiopathic) with ulcer and inflammation of right lower extremity: Secondary | ICD-10-CM | POA: Diagnosis not present

## 2017-01-17 DIAGNOSIS — L97919 Non-pressure chronic ulcer of unspecified part of right lower leg with unspecified severity: Principal | ICD-10-CM

## 2017-01-17 NOTE — Progress Notes (Signed)
Patient seen as nurse only visit, and right leg dressing was unwrapped and rewrapped.  Patient will followup with Dr Sharol Given as scheduled this Thursday.

## 2017-01-19 ENCOUNTER — Ambulatory Visit (INDEPENDENT_AMBULATORY_CARE_PROVIDER_SITE_OTHER): Payer: Medicare Other | Admitting: Family

## 2017-01-19 ENCOUNTER — Encounter (INDEPENDENT_AMBULATORY_CARE_PROVIDER_SITE_OTHER): Payer: Self-pay | Admitting: Orthopedic Surgery

## 2017-01-19 DIAGNOSIS — I87331 Chronic venous hypertension (idiopathic) with ulcer and inflammation of right lower extremity: Secondary | ICD-10-CM

## 2017-01-19 DIAGNOSIS — Z9889 Other specified postprocedural states: Secondary | ICD-10-CM

## 2017-01-19 DIAGNOSIS — L97919 Non-pressure chronic ulcer of unspecified part of right lower leg with unspecified severity: Secondary | ICD-10-CM

## 2017-01-19 DIAGNOSIS — Z945 Skin transplant status: Secondary | ICD-10-CM

## 2017-01-19 NOTE — Progress Notes (Signed)
Office Visit Note   Patient: Alice [name]           Date of Birth: 02/25/1971           MRN: [id number] Visit Date: 01/19/2017              Requested by: Benito Mccreedy, MD [facility] [address] [place], [place] [address] PCP: Benito Mccreedy, MD  Chief Complaint  Patient presents with  . Right Foot - Pain      HPI: Patient presents for follow-up for her venous stasis ulcer right lower extremity. Status post skin grafting.  Assessment & Plan: Visit Diagnoses:  1. Hx of skin graft   2. 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 Follow up in the office once per week for dressing changes.  Follow-Up Instructions: Return in about 1 week (around 01/26/2017).   Ortho Exam  Patient is alert, oriented, no adenopathy, well-dressed, normal affect, normal respiratory effort. Patient has antalgic gait. On examination the ulcer is stable and flat with 100% beefy granulation tissue. she has swelling in her foot there is no cellulitis no odor no signs of infection.  Imaging: No results found.   .  Labs: Lab Results  Component Value Date   REPTSTATUS 01/04/2017 FINAL 12/30/2016   REPTSTATUS 01/04/2017 FINAL 12/30/2016   GRAMSTAIN  05/17/2013    MODERATE WBC PRESENT, PREDOMINANTLY MONONUCLEAR MODERATE GRAM POSITIVE COCCI IN PAIRS IN CHAINS RARE GRAM NEGATIVE RODS Gram Stain Report Called to,Read Back By and Verified With: HOPPER,M RN @ [phone] 05/17/13 LEONARD,A   GRAMSTAIN  05/17/2013    MODERATE WBC PRESENT, PREDOMINANTLY MONONUCLEAR MODERATE SQUAMOUS EPITHELIAL CELLS PRESENT MODERATE GRAM POSITIVE COCCI IN PAIRS IN CHAINS RARE GRAM NEGATIVE RODS Performed at [facility]   CULT NO GROWTH 5 DAYS 12/30/2016   CULT NO GROWTH 5 DAYS 12/30/2016   LABORGA ESCHERICHIA COLI (A) 12/29/2016    Orders:    No orders of the defined types were placed in this encounter.  No orders of the defined types were placed in this encounter.    Procedures: No procedures performed  Clinical Data: No additional findings.  ROS:  All other systems negative, except as noted in the HPI. Review of Systems  Constitutional: Negative for chills and fever.  Cardiovascular: Positive for leg swelling.  Skin: Positive for wound. Negative for color change.    Objective: Vital Signs: There were no vitals taken for this visit.  Specialty Comments:  No specialty comments available.  PMFS History: Patient Active Problem List   Diagnosis Date Noted  . Acute lower UTI 12/29/2016  . Sepsis ([place]) 12/29/2016  . Acute kidney injury superimposed on CKD ([place]) 12/28/2016  . Atypical chest pain 12/28/2016  . Hx of skin graft 08/01/2016  . 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 ([place]) 07/25/2016  . 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 Trichomonal infection 11/24/2015  . Abdominal pain 05/02/2015  . Symptomatic cholelithiasis 04/16/2015  . Acute bilateral upper abdominal pain 04/16/2015  . Microcytic anemia 05/18/2013  . Hypotension, unspecified 05/17/2013  . CKD (chronic kidney disease), stage III 05/17/2013  . Hypokalemia 05/17/2013  . Anal fissure 02/06/2013  . Anal skin tag 02/06/2013  . ALLERGIC RHINITIS 03/05/2010  . LOW BACK PAIN SYNDROME 12/13/2007  .  WOUND INFECTION 10/10/2007  . OBESITY, MORBID 12/02/2006  . HTN (hypertension) 12/02/2006  . History of pulmonary embolism 12/02/2006  . History of DVT (deep vein thrombosis) 12/02/2006  . 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 GASTROESOPHAGEAL REFLUX DISEASE 12/02/2006  . OSA (obstructive sleep apnea) 12/02/2006   Past Medical History:  Diagnosis Date  . Anemia   . Anxiety   . Chronic bronchitis ([place])   . Chronic upper back pain   .  Depression   . DVT (deep venous thrombosis) ([facility])    BLE  . GERD (gastroesophageal reflux disease)   . Headache    "weekly" (04/16/2015)  . Hypertension   . Migraine    "monthly" (04/16/2015)  . Obesity   . Pneumonia 2014  . Pulmonary embolism ([place])   . Sinusitis nasal   . Sleep apnea    "I'm suppose to wear a mask but I don't" (04/16/2015)  . Varicose veins of right lower extremity   . Venous stasis of lower extremity    right    Family History  Problem Relation Age of Onset  . Kidney disease Mother        kidney transplant    Past Surgical History:  Procedure Laterality Date  . CHOLECYSTECTOMY N/A 04/18/2015   Procedure: LAPAROSCOPIC CHOLECYSTECTOMY;  Surgeon: Ralene Ok, MD;  Location: [facility];  Service: General;  Laterality: N/A;  . HYSTEROSCOPY W/D&C  12/24/2001   Archie Endo 09/28/2010  . I&D EXTREMITY Right 07/25/2016   Procedure: IRRIGATION AND DEBRIDEMENT RIGHT LEG ULCER, APPLY VERAFLO VAC;  Surgeon: [name], MD;  Location: [place];  Service: Orthopedics;  Laterality: Right;  . INCISE AND DRAIN ABCESS Right 07/14/2016  . INCISION AND DRAINAGE Right 09/10/2008   leg:  skin and soft tissue and muscle/notes 09/15/2010  . INCISION AND DRAINAGE Right 01/01/2008   Chronic venous stasis insufficiency ulcer,/notes 09/14/2010/  . INCISION AND DRAINAGE Right 05/6107   calf w/application wound vac/notes 07/20/2010  . INCISION AND DRAINAGE Right 07/25/2016   IRRIGATION AND DEBRIDEMENT RIGHT LEG ULCER,  . LAPAROSCOPIC GASTRIC BYPASS  ~ 2007  . SKIN GRAFT SPLIT THICKNESS LEG / FOOT Right 07/25/2016   LEG  . SKIN SPLIT GRAFT Right 07/27/2016   Procedure: SKIN GRAFT RIGHT LEG WITH THERASKIN APPLICATION;  Surgeon: [name], MD;  Location: [place];  Service: Orthopedics;  Laterality: Right;  . TONSILLECTOMY     Social History   Occupational History  . Not on file.   Social History Main Topics  . Smoking status: Never Smoker  . Smokeless tobacco: Never Used  . Alcohol use No  .  Drug use: No  . Sexual activity: Yes    Partners: Male    Birth control/ protection: IUD

## 2017-01-26 ENCOUNTER — Inpatient Hospital Stay (INDEPENDENT_AMBULATORY_CARE_PROVIDER_SITE_OTHER): Payer: Medicare Other | Admitting: Physician Assistant

## 2017-01-26 ENCOUNTER — Ambulatory Visit (INDEPENDENT_AMBULATORY_CARE_PROVIDER_SITE_OTHER): Payer: Medicare Other | Admitting: Orthopedic Surgery

## 2017-01-26 ENCOUNTER — Encounter: Payer: Self-pay | Admitting: Cardiology

## 2017-01-26 ENCOUNTER — Ambulatory Visit (INDEPENDENT_AMBULATORY_CARE_PROVIDER_SITE_OTHER): Payer: Medicare Other | Admitting: Cardiology

## 2017-01-26 VITALS — BP 132/80 | HR 78 | Ht 68.0 in | Wt 311.1 lb

## 2017-01-26 DIAGNOSIS — I83019 Varicose veins of right lower extremity with ulcer of unspecified site: Secondary | ICD-10-CM

## 2017-01-26 DIAGNOSIS — Z945 Skin transplant status: Secondary | ICD-10-CM

## 2017-01-26 DIAGNOSIS — L97919 Non-pressure chronic ulcer of unspecified part of right lower leg with unspecified severity: Secondary | ICD-10-CM

## 2017-01-26 DIAGNOSIS — I5023 Acute on chronic systolic (congestive) heart failure: Secondary | ICD-10-CM

## 2017-01-26 DIAGNOSIS — R0789 Other chest pain: Secondary | ICD-10-CM | POA: Diagnosis not present

## 2017-01-26 NOTE — Patient Instructions (Signed)
Medication Instructions:  Your physician recommends that you continue on your current medications as directed. Please refer to the Current Medication list given to you today.   Labwork: None ordered  Testing/Procedures: Your physician has requested that you have a lexiscan myoview. For further information please visit [email or web address]. Please follow instruction sheet, as given.   Follow-Up: Your physician recommends that you schedule a follow-up appointment in: DR. Thera Flake 1ST AVAILABLE   Any Other Special Instructions Will Be Listed Below (If Applicable). Regadenoson injection What is this medicine? REGADENOSON is used to test the heart for coronary artery disease. It is used in patients who can not exercise for their stress test. This medicine may be used for other purposes; ask your health care provider or pharmacist if you have questions. COMMON BRAND NAME(S): Lexiscan What should I tell my health care provider before I take this medicine? They need to know if you have any of these conditions: -heart problems -lung or breathing disease, like asthma or COPD -an unusual or allergic reaction to regadenoson, other medicines, foods, dyes, or preservatives -pregnant or trying to get pregnant -breast-feeding How should I use this medicine? This medicine is for injection into a vein. It is given by a health care professional in a hospital or clinic setting. Talk to your pediatrician regarding the use of this medicine in children. Special care may be needed. Overdosage: If you think you have taken too much of this medicine contact a poison control center or emergency room at once. NOTE: This medicine is only for you. Do not share this medicine with others. What if I miss a dose? This does not apply. What may interact with this medicine? -caffeine -dipyridamole -guarana -theophylline This list may not describe all possible interactions. Give your health care provider a list of  all the medicines, herbs, non-prescription drugs, or dietary supplements you use. Also tell them if you smoke, drink alcohol, or use illegal drugs. Some items may interact with your medicine. What should I watch for while using this medicine? Your condition will be monitored carefully while you are receiving this medicine. Do not take medicines, foods, or drinks with caffeine (like coffee, tea, or colas) for at least 12 hours before your test. If you do not know if something contains caffeine, ask your health care professional. What side effects may I notice from receiving this medicine? Side effects that you should report to your doctor or health care professional as soon as possible: -allergic reactions like skin rash, itching or hives, swelling of the face, lips, or tongue -breathing problems -chest pain, tightness or palpitations -severe headache Side effects that usually do not require medical attention (report to your doctor or health care professional if they continue or are bothersome): -flushing -headache -irritation or pain at site where injected -nausea, vomiting This list may not describe all possible side effects. Call your doctor for medical advice about side effects. You may report side effects to FDA at 1-800-FDA-1088. Where should I keep my medicine? This drug is given in a hospital or clinic and will not be stored at home. NOTE: This sheet is a summary. It may not cover all possible information. If you have questions about this medicine, talk to your doctor, pharmacist, or health care provider.  2018 Elsevier/Gold Standard (2007-12-31 15:08:13)     If you need a refill on your cardiac medications before your next appointment, please call your pharmacy.

## 2017-01-26 NOTE — Progress Notes (Signed)
01/26/2017 Alice [name]   [birth year]  [id number]  Primary Physician Benito Mccreedy, MD Primary Cardiologist: Dr. Meda Coffee    Reason for Visit/CC: Post hospital F/u for Chest Pain and systolic HF  HPI:  Alice [name] is a 46 y.o. female who presents to clinic today for post hospital f/u.   She has a PMH of of DVT, PE on Coumadin, hypertension, obesity, anemia and right lower extremity wound. She was recently admitted to the hospital for sepsis in the setting of UTI/ right sided pyelonephritis and AKI. Scr jumped to 2.5. She was treated with IVFs and antibiotics. She also was later found to have PNA and anemia, requiring blood transfusion.   During her admission, she complained of atypical chest pain with radiation through her upper back. D-dimer was abnormal, however chest CT negative for PE. Troponin was 0.05>>0.05. Felt to be likely demand ischemia in the setting of sepsis, infection and AKI. 2D echo however showed slightly reduced LVEF at 45-50%. It was recommended that she undergo outpatient ischemic w/u once recovered from her acute illness.   Today in f/u she reports that she has done well since discharge. She feels much better. No pelvic, flank or LBP. No urinary symptoms. No dyspnea or cough. No LEE, orthopnea or PND. She also denies any recurrent CP. Her AKI resolved prior to d/c with [place] returning back to baseline at 1.1.     Current Meds  Medication Sig  . albuterol (PROVENTIL HFA;VENTOLIN HFA) 108 (90 Base) MCG/ACT inhaler Inhale 2-3 puffs into the lungs every 6 (six) hours as needed for wheezing or shortness of breath.   Marland Kitchen amLODipine (NORVASC) 10 MG tablet Take 1 tablet (10 mg total) by mouth daily.  Marland Kitchen buPROPion (WELLBUTRIN XL) 150 MG 24 hr tablet Take 150 mg by mouth daily.  Marland Kitchen levonorgestrel (MIRENA) 20 MCG/24HR IUD 1 each by Intrauterine route once.   . nitroGLYCERIN (NITROSTAT) 0.4 MG SL tablet Place 1 tablet (0.4 mg total) under the tongue every 5 (five) minutes x 3 doses  as needed for chest pain.  . pantoprazole (PROTONIX) 40 MG tablet Take 1 tablet (40 mg total) by mouth daily. For acid reflux  . sodium bicarbonate 650 MG tablet Take 2 tablets (1,300 mg total) by mouth 2 (two) times daily. X 3 weeks  . warfarin (COUMADIN) 2.5 MG tablet Take 5 tablets (12.5 mg total) by mouth one time only at 6 PM. HOLD coumadin until further instructions from your doctor   Allergies  Allergen Reactions  . Oxycodone Hcl Nausea Only  . Vicodin [Hydrocodone-Acetaminophen] Nausea And Vomiting   Past Medical History:  Diagnosis Date  . Anemia   . Anxiety   . Chronic bronchitis ([place])   . Chronic upper back pain   . Depression   . DVT (deep venous thrombosis) (HCC)    BLE  . GERD (gastroesophageal reflux disease)   . Headache    "weekly" (04/16/2015)  . Hypertension   . Migraine    "monthly" (04/16/2015)  . Obesity   . Pneumonia 2014  . Pulmonary embolism ([place])   . Sinusitis nasal   . Sleep apnea    "I'm suppose to wear a mask but I don't" (04/16/2015)  . Varicose veins of right lower extremity   . Venous stasis of lower extremity    right   Family History  Problem Relation Age of Onset  . Kidney disease Mother        kidney transplant   Past Surgical History:  Procedure Laterality Date  . CHOLECYSTECTOMY N/A 04/18/2015   Procedure: LAPAROSCOPIC CHOLECYSTECTOMY;  Surgeon: Ralene Ok, MD;  Location: [facility];  Service: General;  Laterality: N/A;  . HYSTEROSCOPY W/D&C  12/24/2001   Archie Endo 09/28/2010  . I&D EXTREMITY Right 07/25/2016   Procedure: IRRIGATION AND DEBRIDEMENT RIGHT LEG ULCER, APPLY VERAFLO VAC;  Surgeon: [name], MD;  Location: [facility];  Service: Orthopedics;  Laterality: Right;  . INCISE AND DRAIN ABCESS Right 07/14/2016  . INCISION AND DRAINAGE Right 09/10/2008   leg:  skin and soft tissue and muscle/notes 09/15/2010  . INCISION AND DRAINAGE Right 01/01/2008   Chronic venous stasis insufficiency ulcer,/notes 09/14/2010/  . INCISION AND DRAINAGE  Right 08/979   calf w/application wound vac/notes 07/20/2010  . INCISION AND DRAINAGE Right 07/25/2016   IRRIGATION AND DEBRIDEMENT RIGHT LEG ULCER,  . LAPAROSCOPIC GASTRIC BYPASS  ~ 2007  . SKIN GRAFT SPLIT THICKNESS LEG / FOOT Right 07/25/2016   LEG  . SKIN SPLIT GRAFT Right 07/27/2016   Procedure: SKIN GRAFT RIGHT LEG WITH THERASKIN APPLICATION;  Surgeon: [name], MD;  Location: [facility];  Service: Orthopedics;  Laterality: Right;  . TONSILLECTOMY     Social History   Social History  . Marital status: Single    Spouse name: N/A  . Number of children: N/A  . Years of education: N/A   Occupational History  . Not on file.   Social History Main Topics  . Smoking status: Never Smoker  . Smokeless tobacco: Never Used  . Alcohol use No  . Drug use: No  . Sexual activity: Yes    Partners: Male    Birth control/ protection: IUD   Other Topics Concern  . Not on file   Social History Narrative  . No narrative on file     Review of Systems: General: negative for chills, fever, night sweats or weight changes.  Cardiovascular: negative for chest pain, dyspnea on exertion, edema, orthopnea, palpitations, paroxysmal nocturnal dyspnea or shortness of breath Dermatological: negative for rash Respiratory: negative for cough or wheezing Urologic: negative for hematuria Abdominal: negative for nausea, vomiting, diarrhea, bright red blood per rectum, melena, or hematemesis Neurologic: negative for visual changes, syncope, or dizziness All other systems reviewed and are otherwise negative except as noted above.   Physical Exam:  Blood pressure 132/80, pulse 78, height 5\' 8"  (1.727 m), weight (!) 311 lb 1.9 oz (141.1 kg).  General appearance: alert, cooperative and no distress Neck: no carotid bruit and no JVD Lungs: clear to auscultation bilaterally Heart: regular rate and rhythm, S1, S2 normal, no murmur, click, rub or gallop Extremities: extremities normal, atraumatic, no  cyanosis or edema Pulses: 2+ and symmetric Skin: Skin color, texture, turgor normal. No rashes or lesions Neurologic: Grossly normal  EKG not performed -- personally reviewed   ASSESSMENT AND PLAN:   1. Atypical Chest Pain with abnormal Troponin and mildly reduced EF on Echo: recent presentation was in the setting of multiple acute medical illnesses including sepsis, pyelonephritis, AKI, PNA and anemia, requiring blood transfusion. Pain was atypical. Chest CT negative for PE. Troponin low level and flat at 0.05 x 2, likely demand ischemia from sepsis. EF 45%, also likely stress induced. She has recovered from her acute medical issues. CP resolved, however given her risk factors, we will plan for a nuclear stress test for risk stratification. No further cardiac w/u if negative. Continue management of risk factors.    Follow-Up after stress test.   Lyda Jester  PA-C, [facility] 01/26/2017 5:12 PM

## 2017-01-27 NOTE — Progress Notes (Signed)
Patient was seen for first visit only for dressing change. Patient's wound had excellent granulation tissue, continue with the medical compression sock in contact with the wound plus the Dynaflex wraps.

## 2017-01-30 ENCOUNTER — Telehealth (HOSPITAL_COMMUNITY): Payer: Self-pay

## 2017-01-30 NOTE — Telephone Encounter (Signed)
Patient given detailed instructions per Myocardial Perfusion Study Information Sheet for the test on 02/06/2017 at 12:30. Patient notified to arrive 15 minutes early and that it is imperative to arrive on time for appointment to keep from having the test rescheduled.  If you need to cancel or reschedule your appointment, please call the office within 24 hours of your appointment. . Patient verbalized understanding.EHK

## 2017-02-02 ENCOUNTER — Ambulatory Visit (INDEPENDENT_AMBULATORY_CARE_PROVIDER_SITE_OTHER): Payer: Medicare Other | Admitting: Orthopedic Surgery

## 2017-02-02 DIAGNOSIS — I87331 Chronic venous hypertension (idiopathic) with ulcer and inflammation of right lower extremity: Secondary | ICD-10-CM

## 2017-02-02 DIAGNOSIS — Z945 Skin transplant status: Secondary | ICD-10-CM

## 2017-02-02 DIAGNOSIS — L97919 Non-pressure chronic ulcer of unspecified part of right lower leg with unspecified severity: Principal | ICD-10-CM

## 2017-02-02 NOTE — Progress Notes (Addendum)
Office Visit Note   Patient: Alice [name]           Date of Birth: 18-Jun-1970           MRN: [id number] Visit Date: 02/02/2017              Requested by: Benito Mccreedy, MD [address] [place], [place] [address] PCP: Benito Mccreedy, MD  Chief Complaint  Patient presents with  . Right Leg - Follow-up      HPI: Patient presents for follow-up for her venous stasis ulcer right lower extremity. Status post skin grafting. Have been applying dynaflex with Vive sock pieces right up against the wound.  Assessment & Plan: Visit Diagnoses:  No diagnosis found.  Plan: We will reapply the medical compression sock to the wound covered with a Dynaflex compression wrap. Follow up in the office once per week for dressing changes.   Follow-Up Instructions: No Follow-up on file.   Ortho Exam  Patient is alert, oriented, no adenopathy, well-dressed, normal affect, normal respiratory effort. Patient has antalgic gait. On examination the ulcer is stable and flat with 100% beefy granulation tissue. No bleeding. Does have 2 small new ulcerative areas where the dressing had rolled down. These are filled in with granulation tissue as well. she has swelling in her foot there is no cellulitis no odor no signs of infection.   wound measures 95 x 45 millimeters. Continue slow improvement.  Imaging: No results found.   .  Labs: Lab Results  Component Value Date   REPTSTATUS 01/04/2017 FINAL 12/30/2016   REPTSTATUS 01/04/2017 FINAL 12/30/2016   GRAMSTAIN  05/17/2013    MODERATE WBC PRESENT, PREDOMINANTLY MONONUCLEAR MODERATE GRAM POSITIVE COCCI IN PAIRS IN CHAINS RARE GRAM NEGATIVE RODS Gram Stain Report Called to,Read Back By and Verified With: HOPPER,M RN @ [phone] 05/17/13 LEONARD,A   GRAMSTAIN  05/17/2013    MODERATE WBC PRESENT, PREDOMINANTLY MONONUCLEAR MODERATE SQUAMOUS EPITHELIAL CELLS PRESENT MODERATE GRAM POSITIVE COCCI IN PAIRS IN CHAINS RARE GRAM NEGATIVE  RODS Performed at [facility]   CULT NO GROWTH 5 DAYS 12/30/2016   CULT NO GROWTH 5 DAYS 12/30/2016   LABORGA ESCHERICHIA COLI (A) 12/29/2016    Orders:  No orders of the defined types were placed in this encounter.  No orders of the defined types were placed in this encounter.    Procedures: No procedures performed  Clinical Data: No additional findings.  ROS:  All other systems negative, except as noted in the HPI. Review of Systems  Constitutional: Negative for chills and fever.  Cardiovascular: Positive for leg swelling.  Skin: Positive for wound. Negative for color change.    Objective: Vital Signs: There were no vitals taken for this visit.  Specialty Comments:  No specialty comments available.  PMFS History: Patient Active Problem List   Diagnosis Date Noted  . Acute lower UTI 12/29/2016  . Sepsis ([place]) 12/29/2016  . Acute kidney injury superimposed on CKD ([place]) 12/28/2016  . Atypical chest pain 12/28/2016  . Hx of skin graft 08/01/2016  . 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 ([place]) 07/25/2016  . 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 ([place]) 07/05/2016  . Trichomonal infection 11/24/2015  . Abdominal pain 05/02/2015  . Symptomatic cholelithiasis 04/16/2015  . Acute bilateral upper abdominal pain 04/16/2015  . Microcytic anemia 05/18/2013  . Hypotension, unspecified 05/17/2013  . CKD (chronic kidney disease), stage III 05/17/2013  . Hypokalemia 05/17/2013  .  Anal fissure 02/06/2013  . Anal skin tag 02/06/2013  . ALLERGIC RHINITIS 03/05/2010  . LOW BACK PAIN SYNDROME 12/13/2007  . WOUND INFECTION 10/10/2007  . OBESITY, MORBID 12/02/2006  . HTN (hypertension) 12/02/2006  . History of pulmonary embolism 12/02/2006  . History of DVT (deep vein thrombosis) 12/02/2006  . 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 GASTROESOPHAGEAL REFLUX DISEASE 12/02/2006  . OSA  (obstructive sleep apnea) 12/02/2006   Past Medical History:  Diagnosis Date  . Anemia   . Anxiety   . Chronic bronchitis ([place])   . Chronic upper back pain   . Depression   . DVT (deep venous thrombosis) (HCC)    BLE  . GERD (gastroesophageal reflux disease)   . Headache    "weekly" (04/16/2015)  . Hypertension   . Migraine    "monthly" (04/16/2015)  . Obesity   . Pneumonia 2014  . Pulmonary embolism ([place])   . Sinusitis nasal   . Sleep apnea    "I'm suppose to wear a mask but I don't" (04/16/2015)  . Varicose veins of right lower extremity   . Venous stasis of lower extremity    right    Family History  Problem Relation Age of Onset  . Kidney disease Mother        kidney transplant    Past Surgical History:  Procedure Laterality Date  . CHOLECYSTECTOMY N/A 04/18/2015   Procedure: LAPAROSCOPIC CHOLECYSTECTOMY;  Surgeon: Ralene Ok, MD;  Location: [facility];  Service: General;  Laterality: N/A;  . HYSTEROSCOPY W/D&C  12/24/2001   Archie Endo 09/28/2010  . I&D EXTREMITY Right 07/25/2016   Procedure: IRRIGATION AND DEBRIDEMENT RIGHT LEG ULCER, APPLY VERAFLO VAC;  Surgeon: [name], MD;  Location: [facility];  Service: Orthopedics;  Laterality: Right;  . INCISE AND DRAIN ABCESS Right 07/14/2016  . INCISION AND DRAINAGE Right 09/10/2008   leg:  skin and soft tissue and muscle/notes 09/15/2010  . INCISION AND DRAINAGE Right 01/01/2008   Chronic venous stasis insufficiency ulcer,/notes 09/14/2010/  . INCISION AND DRAINAGE Right 01/5283   calf w/application wound vac/notes 07/20/2010  . INCISION AND DRAINAGE Right 07/25/2016   IRRIGATION AND DEBRIDEMENT RIGHT LEG ULCER,  . LAPAROSCOPIC GASTRIC BYPASS  ~ 2007  . SKIN GRAFT SPLIT THICKNESS LEG / FOOT Right 07/25/2016   LEG  . SKIN SPLIT GRAFT Right 07/27/2016   Procedure: SKIN GRAFT RIGHT LEG WITH THERASKIN APPLICATION;  Surgeon: [name], MD;  Location: [facility];  Service: Orthopedics;  Laterality: Right;  . TONSILLECTOMY     Social  History   Occupational History  . Not on file.   Social History Main Topics  . Smoking status: Never Smoker  . Smokeless tobacco: Never Used  . Alcohol use No  . Drug use: No  . Sexual activity: Yes    Partners: Male    Birth control/ protection: IUD

## 2017-02-06 ENCOUNTER — Telehealth (HOSPITAL_COMMUNITY): Payer: Self-pay | Admitting: Cardiology

## 2017-02-06 ENCOUNTER — Ambulatory Visit (HOSPITAL_COMMUNITY): Payer: Medicare Other

## 2017-02-06 NOTE — Telephone Encounter (Signed)
I called pt and spoke with her about rescheduling her Myoview appt that she missed today(02/06/17) and she voiced that she would call back tomorrow to reschedule because she did not have her schedule in front of her and has quite a few appts coming up.

## 2017-02-07 ENCOUNTER — Ambulatory Visit (HOSPITAL_COMMUNITY): Payer: Medicare Other

## 2017-02-08 DIAGNOSIS — R7303 Prediabetes: Secondary | ICD-10-CM | POA: Diagnosis not present

## 2017-02-08 DIAGNOSIS — E559 Vitamin D deficiency, unspecified: Secondary | ICD-10-CM | POA: Diagnosis not present

## 2017-02-08 DIAGNOSIS — F329 Major depressive disorder, single episode, unspecified: Secondary | ICD-10-CM | POA: Diagnosis not present

## 2017-02-08 DIAGNOSIS — R1013 Epigastric pain: Secondary | ICD-10-CM | POA: Diagnosis not present

## 2017-02-08 DIAGNOSIS — Z7901 Long term (current) use of anticoagulants: Secondary | ICD-10-CM | POA: Diagnosis not present

## 2017-02-08 DIAGNOSIS — G629 Polyneuropathy, unspecified: Secondary | ICD-10-CM | POA: Diagnosis not present

## 2017-02-08 DIAGNOSIS — G4733 Obstructive sleep apnea (adult) (pediatric): Secondary | ICD-10-CM | POA: Diagnosis not present

## 2017-02-08 DIAGNOSIS — I10 Essential (primary) hypertension: Secondary | ICD-10-CM | POA: Diagnosis not present

## 2017-02-09 ENCOUNTER — Ambulatory Visit (INDEPENDENT_AMBULATORY_CARE_PROVIDER_SITE_OTHER): Payer: Medicare Other | Admitting: Family

## 2017-02-15 ENCOUNTER — Inpatient Hospital Stay (INDEPENDENT_AMBULATORY_CARE_PROVIDER_SITE_OTHER): Payer: Medicare Other | Admitting: Physician Assistant

## 2017-02-21 ENCOUNTER — Ambulatory Visit (INDEPENDENT_AMBULATORY_CARE_PROVIDER_SITE_OTHER): Payer: Medicare Other | Admitting: Orthopedic Surgery

## 2017-02-21 ENCOUNTER — Ambulatory Visit (HOSPITAL_COMMUNITY): Payer: Medicare Other

## 2017-02-21 DIAGNOSIS — I83019 Varicose veins of right lower extremity with ulcer of unspecified site: Secondary | ICD-10-CM

## 2017-02-21 DIAGNOSIS — L97919 Non-pressure chronic ulcer of unspecified part of right lower leg with unspecified severity: Secondary | ICD-10-CM | POA: Diagnosis not present

## 2017-02-21 DIAGNOSIS — I87331 Chronic venous hypertension (idiopathic) with ulcer and inflammation of right lower extremity: Secondary | ICD-10-CM

## 2017-02-21 NOTE — Progress Notes (Signed)
Office Visit Note   Patient: Alice [name]           Date of Birth: 1970/09/11           MRN: [id number] Visit Date: 02/21/2017              Requested by: Benito Mccreedy, MD [address] [place], [place] [address] PCP: Benito Mccreedy, MD  No chief complaint on file.     HPI: Patient presents follow-up status post skin graft for chronic venous ulcer medial aspect of the right calf. She is been undergoing wraps with the medical sock against the wound and a Dynaflex wraps. Patient is extremely pleased with her progress and her current treatment regimen. She has noticed one staple in the wound.  Assessment & Plan: Visit Diagnoses:  1. 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 (HCC)   2. 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 Patient does not want the staple removed at this time. Follow-up for compression wrap in 1 week.  Follow-Up Instructions: Return in about 1 week (around 02/28/2017).   Ortho Exam  Patient is alert, oriented, no adenopathy, well-dressed, normal affect, normal respiratory effort. Examination the wound is healthy there is excellent granulation tissue no hyper granulation noted drainage no odor no cellulitis the wound is flat. The wound measures 10 cm x 3.5 cm in the mid aspect of the wound. It is 0.1 mm deep. There is no tenderness around the wound she has brawny skin color changes but the skin shows excellent wrinkling at this time.  Imaging: No results found. No images are attached to the encounter.  Labs: Lab Results  Component Value Date   REPTSTATUS 01/04/2017 FINAL 12/30/2016   REPTSTATUS 01/04/2017 FINAL 12/30/2016   GRAMSTAIN  05/17/2013    MODERATE WBC PRESENT, PREDOMINANTLY MONONUCLEAR MODERATE GRAM POSITIVE COCCI IN PAIRS IN CHAINS RARE GRAM NEGATIVE RODS Gram Stain  Report Called to,Read Back By and Verified With: HOPPER,M RN @ [phone] 05/17/13 LEONARD,A   GRAMSTAIN  05/17/2013    MODERATE WBC PRESENT, PREDOMINANTLY MONONUCLEAR MODERATE SQUAMOUS EPITHELIAL CELLS PRESENT MODERATE GRAM POSITIVE COCCI IN PAIRS IN CHAINS RARE GRAM NEGATIVE RODS Performed at [facility]   CULT NO GROWTH 5 DAYS 12/30/2016   CULT NO GROWTH 5 DAYS 12/30/2016   LABORGA ESCHERICHIA COLI (A) 12/29/2016    Orders:  No orders of the defined types were placed in this encounter.  No orders of the defined types were placed in this encounter.    Procedures: No procedures performed  Clinical Data: No additional findings.  ROS:  All other systems negative, except as noted in the HPI. Review of Systems  Objective: Vital Signs: There were no vitals taken for this visit.  Specialty Comments:  No specialty comments available.  PMFS History: Patient Active Problem List   Diagnosis Date Noted  . Acute lower UTI 12/29/2016  . Sepsis ([place]) 12/29/2016  . Acute kidney injury superimposed on CKD ([place]) 12/28/2016  . Atypical chest pain 12/28/2016  . Hx of skin graft 08/01/2016  . 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 ([place]) 07/25/2016  . 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 ([place]) 07/05/2016  . Trichomonal infection 11/24/2015  . Abdominal pain 05/02/2015  . Symptomatic cholelithiasis 04/16/2015  . Acute bilateral upper abdominal pain  04/16/2015  . Microcytic anemia 05/18/2013  . Hypotension, unspecified 05/17/2013  . CKD (chronic kidney disease), stage III ([place]) 05/17/2013  . Hypokalemia 05/17/2013  . Anal fissure 02/06/2013  . Anal skin tag 02/06/2013  . ALLERGIC RHINITIS 03/05/2010  . LOW BACK PAIN SYNDROME 12/13/2007  . WOUND INFECTION 10/10/2007  . OBESITY, MORBID 12/02/2006  . HTN (hypertension) 12/02/2006  . History of pulmonary embolism 12/02/2006  . History of DVT (deep vein  thrombosis) 12/02/2006  . 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 GASTROESOPHAGEAL REFLUX DISEASE 12/02/2006  . OSA (obstructive sleep apnea) 12/02/2006   Past Medical History:  Diagnosis Date  . Anemia   . Anxiety   . Chronic bronchitis ([place])   . Chronic upper back pain   . Depression   . DVT (deep venous thrombosis) (HCC)    BLE  . GERD (gastroesophageal reflux disease)   . Headache    "weekly" (04/16/2015)  . Hypertension   . Migraine    "monthly" (04/16/2015)  . Obesity   . Pneumonia 2014  . Pulmonary embolism ([place])   . Sinusitis nasal   . Sleep apnea    "I'm suppose to wear a mask but I don't" (04/16/2015)  . Varicose veins of right lower extremity   . Venous stasis of lower extremity    right    Family History  Problem Relation Age of Onset  . Kidney disease Mother        kidney transplant    Past Surgical History:  Procedure Laterality Date  . CHOLECYSTECTOMY N/A 04/18/2015   Procedure: LAPAROSCOPIC CHOLECYSTECTOMY;  Surgeon: Ralene Ok, MD;  Location: [facility];  Service: General;  Laterality: N/A;  . HYSTEROSCOPY W/D&C  12/24/2001   Archie Endo 09/28/2010  . I&D EXTREMITY Right 07/25/2016   Procedure: IRRIGATION AND DEBRIDEMENT RIGHT LEG ULCER, APPLY VERAFLO VAC;  Surgeon: [name], MD;  Location: [facility];  Service: Orthopedics;  Laterality: Right;  . INCISE AND DRAIN ABCESS Right 07/14/2016  . INCISION AND DRAINAGE Right 09/10/2008   leg:  skin and soft tissue and muscle/notes 09/15/2010  . INCISION AND DRAINAGE Right 01/01/2008   Chronic venous stasis insufficiency ulcer,/notes 09/14/2010/  . INCISION AND DRAINAGE Right 09/4648   calf w/application wound vac/notes 07/20/2010  . INCISION AND DRAINAGE Right 07/25/2016   IRRIGATION AND DEBRIDEMENT RIGHT LEG ULCER,  . LAPAROSCOPIC GASTRIC BYPASS  ~ 2007  . SKIN GRAFT SPLIT THICKNESS LEG / FOOT Right 07/25/2016   LEG  . SKIN SPLIT GRAFT Right 07/27/2016   Procedure: SKIN GRAFT RIGHT LEG WITH THERASKIN  APPLICATION;  Surgeon: [name], MD;  Location: [facility];  Service: Orthopedics;  Laterality: Right;  . TONSILLECTOMY     Social History   Occupational History  . Not on file.   Social History Main Topics  . Smoking status: Never Smoker  . Smokeless tobacco: Never Used  . Alcohol use No  . Drug use: No  . Sexual activity: Yes    Partners: Male    Birth control/ protection: IUD

## 2017-02-22 ENCOUNTER — Ambulatory Visit (HOSPITAL_COMMUNITY): Payer: Medicare Other

## 2017-02-28 ENCOUNTER — Ambulatory Visit (INDEPENDENT_AMBULATORY_CARE_PROVIDER_SITE_OTHER): Payer: Medicare Other | Admitting: Orthopedic Surgery

## 2017-02-28 ENCOUNTER — Encounter (INDEPENDENT_AMBULATORY_CARE_PROVIDER_SITE_OTHER): Payer: Self-pay | Admitting: Orthopedic Surgery

## 2017-02-28 DIAGNOSIS — L97919 Non-pressure chronic ulcer of unspecified part of right lower leg with unspecified severity: Secondary | ICD-10-CM | POA: Diagnosis not present

## 2017-02-28 DIAGNOSIS — I87331 Chronic venous hypertension (idiopathic) with ulcer and inflammation of right lower extremity: Secondary | ICD-10-CM

## 2017-02-28 NOTE — Progress Notes (Signed)
Office Visit Note   Patient: Alice [name]           Date of Birth: November 26, 1970           MRN: [id number] Visit Date: 02/28/2017              Requested by: Benito Mccreedy, MD [address] [place],  [address] PCP: Benito Mccreedy, MD  No chief complaint on file.     HPI: Patient presents in follow-up for right lower extremity venous stasis insufficiency ulcer. Patient is currently using the medical compression sock against the wound with a Dynaflex wraps.  Assessment & Plan: Visit Diagnoses:  1. 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 Reapply the medical compression sock to the wound plus a Dynaflex wraps.  Follow-Up Instructions: Return in about 1 week (around 03/07/2017).   Ortho Exam  Patient is alert, oriented, no adenopathy, well-dressed, normal affect, normal respiratory effort. On examination patient has about 3 mm of epithelialization around the wound edges. The wound is healthy beefy good granulation tissue there is clear drainage. There is no cellulitis there is minimal swelling no signs of infection.  Imaging: No results found. No images are attached to the encounter.  Labs: Lab Results  Component Value Date   REPTSTATUS 01/04/2017 FINAL 12/30/2016   REPTSTATUS 01/04/2017 FINAL 12/30/2016   GRAMSTAIN  05/17/2013    MODERATE WBC PRESENT, PREDOMINANTLY MONONUCLEAR MODERATE GRAM POSITIVE COCCI IN PAIRS IN CHAINS RARE GRAM NEGATIVE RODS Gram Stain Report Called to,Read Back By and Verified With: HOPPER,M RN @ [phone] 05/17/13 LEONARD,A   GRAMSTAIN  05/17/2013    MODERATE WBC PRESENT, PREDOMINANTLY MONONUCLEAR MODERATE SQUAMOUS EPITHELIAL CELLS PRESENT MODERATE GRAM POSITIVE COCCI IN PAIRS IN CHAINS RARE GRAM NEGATIVE RODS Performed at [facility]   CULT NO GROWTH 5 DAYS 12/30/2016   CULT NO GROWTH 5 DAYS 12/30/2016   LABORGA ESCHERICHIA COLI (A) 12/29/2016    Orders:  No orders of the defined types were placed in this encounter.  No orders of the defined types were placed in this encounter.    Procedures: No procedures performed  Clinical Data: No additional findings.  ROS:  All other systems negative, except as noted in the HPI. Review of Systems  Objective: Vital Signs: There were no vitals taken for this visit.  Specialty Comments:  No specialty comments available.  PMFS History: Patient Active Problem List   Diagnosis Date Noted  . Acute lower UTI 12/29/2016  . Sepsis ([place]) 12/29/2016  . Acute kidney injury superimposed on CKD ([place]) 12/28/2016  . Atypical chest pain 12/28/2016  . Hx of skin graft 08/01/2016  . 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 ([place]) 07/25/2016  . 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 Trichomonal infection 11/24/2015  . Abdominal pain 05/02/2015  . Symptomatic cholelithiasis 04/16/2015  . Acute bilateral upper abdominal pain 04/16/2015  . Microcytic anemia 05/18/2013  . Hypotension, unspecified 05/17/2013  . CKD (chronic kidney disease), stage III ([place]) 05/17/2013  . Hypokalemia 05/17/2013  . Anal fissure 02/06/2013  . Anal skin tag 02/06/2013  . ALLERGIC RHINITIS 03/05/2010  . LOW BACK PAIN SYNDROME 12/13/2007  . WOUND INFECTION 10/10/2007  . OBESITY, MORBID 12/02/2006  . HTN (hypertension) 12/02/2006  .  History of pulmonary embolism 12/02/2006  . History of DVT (deep vein thrombosis) 12/02/2006  . 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 GASTROESOPHAGEAL REFLUX DISEASE 12/02/2006  . OSA (obstructive sleep apnea) 12/02/2006   Past Medical History:  Diagnosis Date  . Anemia   . Anxiety   . Chronic bronchitis ([place])   . Chronic upper back pain   . Depression    . DVT (deep venous thrombosis) (HCC)    BLE  . GERD (gastroesophageal reflux disease)   . Headache    "weekly" (04/16/2015)  . Hypertension   . Migraine    "monthly" (04/16/2015)  . Obesity   . Pneumonia 2014  . Pulmonary embolism ([place])   . Sinusitis nasal   . Sleep apnea    "I'm suppose to wear a mask but I don't" (04/16/2015)  . Varicose veins of right lower extremity   . Venous stasis of lower extremity    right    Family History  Problem Relation Age of Onset  . Kidney disease Mother        kidney transplant    Past Surgical History:  Procedure Laterality Date  . CHOLECYSTECTOMY N/A 04/18/2015   Procedure: LAPAROSCOPIC CHOLECYSTECTOMY;  Surgeon: Ralene Ok, MD;  Location: [facility];  Service: General;  Laterality: N/A;  . HYSTEROSCOPY W/D&C  12/24/2001   Archie Endo 09/28/2010  . I&D EXTREMITY Right 07/25/2016   Procedure: IRRIGATION AND DEBRIDEMENT RIGHT LEG ULCER, APPLY VERAFLO VAC;  Surgeon: [name], MD;  Location: [facility];  Service: Orthopedics;  Laterality: Right;  . INCISE AND DRAIN ABCESS Right 07/14/2016  . INCISION AND DRAINAGE Right 09/10/2008   leg:  skin and soft tissue and muscle/notes 09/15/2010  . INCISION AND DRAINAGE Right 01/01/2008   Chronic venous stasis insufficiency ulcer,/notes 09/14/2010/  . INCISION AND DRAINAGE Right 11/1060   calf w/application wound vac/notes 07/20/2010  . INCISION AND DRAINAGE Right 07/25/2016   IRRIGATION AND DEBRIDEMENT RIGHT LEG ULCER,  . LAPAROSCOPIC GASTRIC BYPASS  ~ 2007  . SKIN GRAFT SPLIT THICKNESS LEG / FOOT Right 07/25/2016   LEG  . SKIN SPLIT GRAFT Right 07/27/2016   Procedure: SKIN GRAFT RIGHT LEG WITH THERASKIN APPLICATION;  Surgeon: [name], MD;  Location: [place];  Service: Orthopedics;  Laterality: Right;  . TONSILLECTOMY     Social History   Occupational History  . Not on file.   Social History Main Topics  . Smoking status: Never Smoker  . Smokeless tobacco: Never Used  . Alcohol use No  . Drug use: No    . Sexual activity: Yes    Partners: Male    Birth control/ protection: IUD

## 2017-03-07 ENCOUNTER — Ambulatory Visit (INDEPENDENT_AMBULATORY_CARE_PROVIDER_SITE_OTHER): Payer: Medicare Other | Admitting: Orthopedic Surgery

## 2017-03-09 ENCOUNTER — Ambulatory Visit (INDEPENDENT_AMBULATORY_CARE_PROVIDER_SITE_OTHER): Payer: Medicare Other | Admitting: Cardiology

## 2017-03-09 ENCOUNTER — Encounter: Payer: Self-pay | Admitting: Cardiology

## 2017-03-09 VITALS — BP 124/82 | HR 68 | Ht 68.0 in | Wt 325.0 lb

## 2017-03-09 DIAGNOSIS — I825Y1 Chronic embolism and thrombosis of unspecified deep veins of right proximal lower extremity: Secondary | ICD-10-CM

## 2017-03-09 DIAGNOSIS — I5023 Acute on chronic systolic (congestive) heart failure: Secondary | ICD-10-CM

## 2017-03-09 DIAGNOSIS — I10 Essential (primary) hypertension: Secondary | ICD-10-CM

## 2017-03-09 DIAGNOSIS — R0789 Other chest pain: Secondary | ICD-10-CM | POA: Diagnosis not present

## 2017-03-09 DIAGNOSIS — I429 Cardiomyopathy, unspecified: Secondary | ICD-10-CM | POA: Diagnosis not present

## 2017-03-09 DIAGNOSIS — I420 Dilated cardiomyopathy: Secondary | ICD-10-CM

## 2017-03-09 NOTE — Patient Instructions (Signed)
Medication Instructions:   Your physician recommends that you continue on your current medications as directed. Please refer to the Current Medication list given to you today.    Testing/Procedures:  Your physician has requested that you have an echocardiogram. Echocardiography is a painless test that uses sound waves to create images of your heart. It provides your doctor with information about the size and shape of your heart and how well your heart's chambers and valves are working. This procedure takes approximately one hour. There are no restrictions for this procedure.  PER DR NELSON PLEASE SCHEDULE THIS PATIENTS ECHO A WEEK PRIOR TO SEEING HER IN 3 MONTHS     Follow-Up:  3 MONTHS WITH DR NELSON---PLEASE HAVE YOUR ECHO SCHEDULED A WEEK PRIOR TO THIS APPOINTMENT       If you need a refill on your cardiac medications before your next appointment, please call your pharmacy.

## 2017-03-09 NOTE — Progress Notes (Signed)
03/09/2017 Alice [name]   18-May-1970  [id number]  Primary Physician Benito Mccreedy, MD Primary Cardiologist: Dr. Meda Coffee    Reason for Visit/CC: Post hospital F/u for Chest Pain and systolic HF  HPI:  Alice [name] is a 46 y.o. female who presents to clinic today for 1 month follow up. She has a PMH of of DVT, PE on Coumadin, hypertension, obesity, anemia and right lower extremity wound. She was recently admitted to the hospital for sepsis in the setting of UTI/ right sided pyelonephritis and AKI. Scr jumped to 2.5. She was treated with IVFs and antibiotics. She also was later found to have PNA and anemia, requiring blood transfusion.  During her admission, she complained of atypical chest pain with radiation through her upper back. D-dimer was abnormal, however chest CT negative for PE. Troponin was 0.05>>0.05. Felt to be likely demand ischemia in the setting of sepsis, infection and AKI. 2D echo however showed slightly reduced LVEF at 45-50%. It was recommended that she undergo outpatient ischemic w/u once recovered from her acute illness.   03/11/2017 - she is coming after 1 month, since the discharge she has been doing really well, she denies any chest pain, SOB, orthopnea, PND, no palpitations. Her leg wound is still healing. No fever or chills. She was supposed to have a stress test but was taking care of her mother and couldn't make it. Acute kidney injury has completely resolved.   Current Meds  Medication Sig  . albuterol (PROVENTIL HFA;VENTOLIN HFA) 108 (90 Base) MCG/ACT inhaler Inhale 2-3 puffs into the lungs every 6 (six) hours as needed for wheezing or shortness of breath.   Marland Kitchen amLODipine (NORVASC) 10 MG tablet Take 1 tablet (10 mg total) by mouth daily.  Marland Kitchen buPROPion (WELLBUTRIN XL) 150 MG 24 hr tablet Take 150 mg by mouth daily.  Marland Kitchen levonorgestrel (MIRENA) 20 MCG/24HR IUD 1 each by Intrauterine route once.   . nitroGLYCERIN (NITROSTAT) 0.4 MG SL tablet Place 1 tablet (0.4 mg  total) under the tongue every 5 (five) minutes x 3 doses as needed for chest pain.  . pantoprazole (PROTONIX) 40 MG tablet Take 1 tablet (40 mg total) by mouth daily. For acid reflux  . warfarin (COUMADIN) 2.5 MG tablet Take 5 tablets (12.5 mg total) by mouth one time only at 6 PM. HOLD coumadin until further instructions from your doctor   Allergies  Allergen Reactions  . Oxycodone Hcl Nausea Only  . Vicodin [Hydrocodone-Acetaminophen] Nausea And Vomiting   Past Medical History:  Diagnosis Date  . Anemia   . Anxiety   . Chronic bronchitis ([place])   . Chronic upper back pain   . Depression   . DVT (deep venous thrombosis) (HCC)    BLE  . GERD (gastroesophageal reflux disease)   . Headache    "weekly" (04/16/2015)  . Hypertension   . Migraine    "monthly" (04/16/2015)  . Obesity   . Pneumonia 2014  . Pulmonary embolism ([place])   . Sinusitis nasal   . Sleep apnea    "I'm suppose to wear a mask but I don't" (04/16/2015)  . Varicose veins of right lower extremity   . Venous stasis of lower extremity    right   Family History  Problem Relation Age of Onset  . Kidney disease Mother        kidney transplant   Past Surgical History:  Procedure Laterality Date  . CHOLECYSTECTOMY N/A 04/18/2015   Procedure: LAPAROSCOPIC CHOLECYSTECTOMY;  Surgeon: Anne Hahn  Rosendo Gros, MD;  Location: [facility];  Service: General;  Laterality: N/A;  . HYSTEROSCOPY W/D&C  12/24/2001   Archie Endo 09/28/2010  . I&D EXTREMITY Right 07/25/2016   Procedure: IRRIGATION AND DEBRIDEMENT RIGHT LEG ULCER, APPLY VERAFLO VAC;  Surgeon: [name], MD;  Location: [place] [facility];  Service: Orthopedics;  Laterality: Right;  . INCISE AND DRAIN ABCESS Right 07/14/2016  . INCISION AND DRAINAGE Right 09/10/2008   leg:  skin and soft tissue and muscle/notes 09/15/2010  . INCISION AND DRAINAGE Right 01/01/2008   Chronic venous stasis insufficiency ulcer,/notes 09/14/2010/  . INCISION AND DRAINAGE Right 05/173   calf w/application wound vac/notes  07/20/2010  . INCISION AND DRAINAGE Right 07/25/2016   IRRIGATION AND DEBRIDEMENT RIGHT LEG ULCER,  . LAPAROSCOPIC GASTRIC BYPASS  ~ 2007  . SKIN GRAFT SPLIT THICKNESS LEG / FOOT Right 07/25/2016   LEG  . SKIN SPLIT GRAFT Right 07/27/2016   Procedure: SKIN GRAFT RIGHT LEG WITH THERASKIN APPLICATION;  Surgeon: [name], MD;  Location: [facility];  Service: Orthopedics;  Laterality: Right;  . TONSILLECTOMY     Social History   Social History  . Marital status: Single    Spouse name: N/A  . Number of children: N/A  . Years of education: N/A   Occupational History  . Not on file.   Social History Main Topics  . Smoking status: Never Smoker  . Smokeless tobacco: Never Used  . Alcohol use No  . Drug use: No  . Sexual activity: Yes    Partners: Male    Birth control/ protection: IUD   Other Topics Concern  . Not on file   Social History Narrative  . No narrative on file     Review of Systems: General: negative for chills, fever, night sweats or weight changes.  Cardiovascular: negative for chest pain, dyspnea on exertion, edema, orthopnea, palpitations, paroxysmal nocturnal dyspnea or shortness of breath Dermatological: negative for rash Respiratory: negative for cough or wheezing Urologic: negative for hematuria Abdominal: negative for nausea, vomiting, diarrhea, bright red blood per rectum, melena, or hematemesis Neurologic: negative for visual changes, syncope, or dizziness All other systems reviewed and are otherwise negative except as noted above.   Physical Exam:  Blood pressure 124/82, pulse 68, height 5\' 8"  (1.727 m), weight (!) 325 lb (147.4 kg).  General appearance: alert, cooperative and no distress Neck: no carotid bruit and no JVD Lungs: clear to auscultation bilaterally Heart: regular rate and rhythm, S1, S2 normal, no murmur, click, rub or gallop Extremities: extremities normal, atraumatic, no cyanosis or edema Pulses: 2+ and symmetric Skin: Skin color,  texture, turgor normal. No rashes or lesions Neurologic: Grossly normal  EKG not performed -- personally reviewed     ASSESSMENT AND PLAN:   1. Atypical Chest Pain with abnormal Troponin and mildly reduced EF on Echo: recent presentation was in the setting of multiple acute medical illnesses including sepsis, pyelonephritis, AKI, PNA and anemia, requiring blood transfusion. Pain was atypical. Chest CT negative for PE. Troponin low level and flat at 0.05 x 2, likely demand ischemia from sepsis. EF 45%, also likely stress induced. She has recovered from her acute medical issues. CP resolved, she is completely asymptomatic. I would hold off stress test for now. Repeat echocardiogram in 3 months, if there is ongoing decrease in LVEF I would order a coronary CTA at that time, otherwise just continue medical therapy.  2. Cardiomyopathy - LVEF 45-50%, repeat ECG in 3 months, start ACEI then if persistent  low LVEF, she wasn't on ACEI/ARB sec to AKI  3. AKI - resolved.  4. HTN - controlled  5. DVT - on warfarin, followed by our coumadin clinic  Follow-Up in 3 months  Ena Dawley, MD [facility] 03/09/2017 3:45 PM

## 2017-03-13 ENCOUNTER — Telehealth (HOSPITAL_COMMUNITY): Payer: Self-pay | Admitting: Cardiology

## 2017-03-13 DIAGNOSIS — E559 Vitamin D deficiency, unspecified: Secondary | ICD-10-CM | POA: Diagnosis not present

## 2017-03-13 DIAGNOSIS — R7303 Prediabetes: Secondary | ICD-10-CM | POA: Diagnosis not present

## 2017-03-13 DIAGNOSIS — Z0000 Encounter for general adult medical examination without abnormal findings: Secondary | ICD-10-CM | POA: Diagnosis not present

## 2017-03-13 DIAGNOSIS — G4733 Obstructive sleep apnea (adult) (pediatric): Secondary | ICD-10-CM | POA: Diagnosis not present

## 2017-03-13 DIAGNOSIS — F329 Major depressive disorder, single episode, unspecified: Secondary | ICD-10-CM | POA: Diagnosis not present

## 2017-03-13 DIAGNOSIS — G629 Polyneuropathy, unspecified: Secondary | ICD-10-CM | POA: Diagnosis not present

## 2017-03-13 DIAGNOSIS — Z7901 Long term (current) use of anticoagulants: Secondary | ICD-10-CM | POA: Diagnosis not present

## 2017-03-13 DIAGNOSIS — R1013 Epigastric pain: Secondary | ICD-10-CM | POA: Diagnosis not present

## 2017-03-13 DIAGNOSIS — I10 Essential (primary) hypertension: Secondary | ICD-10-CM | POA: Diagnosis not present

## 2017-03-13 NOTE — Telephone Encounter (Signed)
User: Cherie Dark A Date/time: 03/13/2017 8:36 AM  Comment: Spoke with patient and she stated that Dr. Meda Coffee told her to that the stress test was not needed at this time..RG  Context: [place] Schedule Orders/Appt Requests Outcome: Completed  Phone number: [phone] Phone Type: Home Phone  Comm. type: Telephone Call type: Outgoing  Contact: Ma Hillock S Relation to patient: Self  Letter:      User: Cherie Dark A Date/time: 03/09/2017 1:40 PM  Comment: Called pt and lmsg for her to CB to r/s [email or web address] Moment  Context: [place] Schedule Orders/Appt Requests Outcome: Left Message  Phone number: [phone] Phone Type: Home Phone  Comm. type: Telephone Call type: Outgoing  Contact: Ma Hillock S Relation to patient: Self  Letter:      User: Cherie Dark A Date/time: 03/01/2017 10:40 AM  Comment: Called pt and she stated that she was in a doctor's appt and would CB to r/s.   Context: [place] Schedule Orders/Appt Requests Outcome: Completed  Phone number: [phone] Phone Type: Home Phone  Comm. type: Telephone Call type: Outgoing  Contact: Ma Hillock S Relation to patient: Self  Letter:

## 2017-03-22 ENCOUNTER — Encounter (INDEPENDENT_AMBULATORY_CARE_PROVIDER_SITE_OTHER): Payer: Self-pay | Admitting: Orthopedic Surgery

## 2017-03-22 ENCOUNTER — Ambulatory Visit (INDEPENDENT_AMBULATORY_CARE_PROVIDER_SITE_OTHER): Payer: Medicare Other | Admitting: Orthopedic Surgery

## 2017-03-22 DIAGNOSIS — L97919 Non-pressure chronic ulcer of unspecified part of right lower leg with unspecified severity: Secondary | ICD-10-CM | POA: Diagnosis not present

## 2017-03-22 DIAGNOSIS — Z945 Skin transplant status: Secondary | ICD-10-CM

## 2017-03-22 DIAGNOSIS — I87331 Chronic venous hypertension (idiopathic) with ulcer and inflammation of right lower extremity: Secondary | ICD-10-CM | POA: Diagnosis not present

## 2017-03-22 NOTE — Progress Notes (Signed)
Office Visit Note   Patient: Alice [name]           Date of Birth: Mar 13, 1971           MRN: [id number] Visit Date: 03/22/2017              Requested by: Benito Mccreedy, MD [address] [place], [place] [address] PCP: Benito Mccreedy, MD  Chief Complaint  Patient presents with  . Right Leg - Follow-up      HPI: Patient presents in follow-up for right lower extremity venous stasis insufficiency ulcer. Patient is currently using the medical compression sock against the wound with a Dynaflex wraps. Has been doing her own wraps at home for several weeks.   States having increased pain in RLE. Has seen primary care who has diagnosed her with neuropathy. Denies history of DM. Has been taking Gabapentin for a little over a week now. Feels little relief with this.   Assessment & Plan: Visit Diagnoses:  1. 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 Hx of skin graft     Plan: continue current wound care patient is pleased with her progress she does not want to try any other dressing combinations. Will not allow [place] to harvest a retained staple from wound bed. Reapply the medical compression sock to the wound plus a Dynaflex wraps.  Follow-Up Instructions: Return in about 1 week (around 03/29/2017).   Ortho Exam  Patient is alert, oriented, no adenopathy, well-dressed, normal affect, normal respiratory effort. On examination patient has about 3 mm of epithelialization around the wound edges. The wound is healthy beefy good granulation tissue there is clear drainage. Does have five 3 mm in diameter new ulcers just distal to the large ulcer. There is no cellulitis there is minimal swelling no signs of infection.  Imaging: No results found. No images are attached to the encounter.  Labs: Lab Results  Component Value Date   REPTSTATUS 01/04/2017 FINAL 12/30/2016   REPTSTATUS 01/04/2017 FINAL 12/30/2016   GRAMSTAIN  05/17/2013    MODERATE WBC PRESENT, PREDOMINANTLY MONONUCLEAR MODERATE GRAM POSITIVE COCCI IN PAIRS IN CHAINS RARE GRAM NEGATIVE RODS Gram Stain Report Called to,Read Back By and Verified With: HOPPER,M RN @ [phone] 05/17/13 LEONARD,A   GRAMSTAIN  05/17/2013    MODERATE WBC PRESENT, PREDOMINANTLY MONONUCLEAR MODERATE SQUAMOUS EPITHELIAL CELLS PRESENT MODERATE GRAM POSITIVE COCCI IN PAIRS IN CHAINS RARE GRAM NEGATIVE RODS Performed at [facility]   CULT NO GROWTH 5 DAYS 12/30/2016   CULT NO GROWTH 5 DAYS 12/30/2016   LABORGA ESCHERICHIA COLI (A) 12/29/2016    Orders:  No orders of the defined types were placed in this encounter.  No orders of the defined types were placed in this encounter.    Procedures: No procedures performed  Clinical Data: No additional findings.  ROS:  All other systems negative, except as noted in the HPI. Review of Systems  Constitutional: Negative for chills and fever.  Cardiovascular: Positive for leg swelling.  Skin: Positive for wound. Negative for color change and rash.  Neurological: Positive for numbness.    Objective: Vital Signs: There were no vitals taken for this visit.  Specialty Comments:  No specialty comments available.  PMFS History: Patient Active Problem List   Diagnosis Date Noted  . Acute lower UTI 12/29/2016  . Sepsis ([place]) 12/29/2016  . Acute kidney injury superimposed on CKD ([place]) 12/28/2016  . Atypical chest pain 12/28/2016  .  Hx of skin graft 08/01/2016  . 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 ([place]) 07/25/2016  . 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 Trichomonal infection 11/24/2015  . Abdominal pain 05/02/2015  . Symptomatic cholelithiasis 04/16/2015  . Acute bilateral upper abdominal pain 04/16/2015  . Microcytic anemia 05/18/2013  . Hypotension, unspecified 05/17/2013  . CKD (chronic kidney disease),  stage III ([place]) 05/17/2013  . Hypokalemia 05/17/2013  . Anal fissure 02/06/2013  . Anal skin tag 02/06/2013  . ALLERGIC RHINITIS 03/05/2010  . LOW BACK PAIN SYNDROME 12/13/2007  . WOUND INFECTION 10/10/2007  . OBESITY, MORBID 12/02/2006  . HTN (hypertension) 12/02/2006  . History of pulmonary embolism 12/02/2006  . History of DVT (deep vein thrombosis) 12/02/2006  . 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 GASTROESOPHAGEAL REFLUX DISEASE 12/02/2006  . OSA (obstructive sleep apnea) 12/02/2006   Past Medical History:  Diagnosis Date  . Anemia   . Anxiety   . Chronic bronchitis ([place])   . Chronic upper back pain   . Depression   . DVT (deep venous thrombosis) (HCC)    BLE  . GERD (gastroesophageal reflux disease)   . Headache    "weekly" (04/16/2015)  . Hypertension   . Migraine    "monthly" (04/16/2015)  . Obesity   . Pneumonia 2014  . Pulmonary embolism ([place])   . Sinusitis nasal   . Sleep apnea    "I'm suppose to wear a mask but I don't" (04/16/2015)  . Varicose veins of right lower extremity   . Venous stasis of lower extremity    right    Family History  Problem Relation Age of Onset  . Kidney disease Mother        kidney transplant    Past Surgical History:  Procedure Laterality Date  . HYSTEROSCOPY W/D&C  12/24/2001   Archie Endo 09/28/2010  . INCISE AND DRAIN ABCESS Right 07/14/2016  . INCISION AND DRAINAGE Right 09/10/2008   leg:  skin and soft tissue and muscle/notes 09/15/2010  . INCISION AND DRAINAGE Right 01/01/2008   Chronic venous stasis insufficiency ulcer,/notes 09/14/2010/  . INCISION AND DRAINAGE Right 12/996   calf w/application wound vac/notes 07/20/2010  . INCISION AND DRAINAGE Right 07/25/2016   IRRIGATION AND DEBRIDEMENT RIGHT LEG ULCER,  . LAPAROSCOPIC GASTRIC BYPASS  ~ 2007  . SKIN GRAFT SPLIT THICKNESS LEG / FOOT Right 07/25/2016   LEG  . TONSILLECTOMY     Social History   Occupational History  . Not on file  Tobacco Use  . Smoking  status: Never Smoker  . Smokeless tobacco: Never Used  Substance and Sexual Activity  . Alcohol use: No  . Drug use: No  . Sexual activity: Yes    Partners: Male    Birth control/protection: IUD

## 2017-03-23 DIAGNOSIS — I10 Essential (primary) hypertension: Secondary | ICD-10-CM | POA: Diagnosis not present

## 2017-03-23 DIAGNOSIS — E559 Vitamin D deficiency, unspecified: Secondary | ICD-10-CM | POA: Diagnosis not present

## 2017-03-23 DIAGNOSIS — G4733 Obstructive sleep apnea (adult) (pediatric): Secondary | ICD-10-CM | POA: Diagnosis not present

## 2017-03-23 DIAGNOSIS — F329 Major depressive disorder, single episode, unspecified: Secondary | ICD-10-CM | POA: Diagnosis not present

## 2017-03-23 DIAGNOSIS — Z7901 Long term (current) use of anticoagulants: Secondary | ICD-10-CM | POA: Diagnosis not present

## 2017-03-23 DIAGNOSIS — G629 Polyneuropathy, unspecified: Secondary | ICD-10-CM | POA: Diagnosis not present

## 2017-03-23 DIAGNOSIS — R7303 Prediabetes: Secondary | ICD-10-CM | POA: Diagnosis not present

## 2017-03-23 DIAGNOSIS — R1013 Epigastric pain: Secondary | ICD-10-CM | POA: Diagnosis not present

## 2017-03-29 ENCOUNTER — Encounter: Payer: Self-pay | Admitting: Obstetrics & Gynecology

## 2017-03-29 ENCOUNTER — Other Ambulatory Visit (HOSPITAL_COMMUNITY)
Admission: RE | Admit: 2017-03-29 | Discharge: 2017-03-29 | Disposition: A | Discharge status: Home / Self Care | Payer: Medicare Other | Source: Ambulatory Visit | Attending: Obstetrics & Gynecology | Admitting: Obstetrics & Gynecology

## 2017-03-29 ENCOUNTER — Ambulatory Visit (INDEPENDENT_AMBULATORY_CARE_PROVIDER_SITE_OTHER): Payer: Medicare Other | Admitting: Obstetrics & Gynecology

## 2017-03-29 VITALS — BP 139/85 | HR 85 | Ht 68.0 in | Wt 321.2 lb

## 2017-03-29 DIAGNOSIS — Z124 Encounter for screening for malignant neoplasm of cervix: Secondary | ICD-10-CM

## 2017-03-29 DIAGNOSIS — Z975 Presence of (intrauterine) contraceptive device: Secondary | ICD-10-CM

## 2017-03-29 DIAGNOSIS — Z1151 Encounter for screening for human papillomavirus (HPV): Secondary | ICD-10-CM

## 2017-03-29 DIAGNOSIS — Z3202 Encounter for pregnancy test, result negative: Secondary | ICD-10-CM | POA: Diagnosis not present

## 2017-03-29 DIAGNOSIS — Z30433 Encounter for removal and reinsertion of intrauterine contraceptive device: Secondary | ICD-10-CM

## 2017-03-29 LAB — POCT PREGNANCY, URINE: Preg Test, Ur: NEGATIVE

## 2017-03-29 MED ORDER — LEVONORGESTREL 18.6 MCG/DAY IU IUD
INTRAUTERINE_SYSTEM | Freq: Once | INTRAUTERINE | Status: AC
  Administered 2017-03-29: 16:00:00 via INTRAUTERINE

## 2017-03-29 NOTE — Progress Notes (Signed)
    GYNECOLOGY OFFICE PROCEDURE NOTE  Alice [name] is a 46 y.o. G0P0000 here for Mirena IUD removal and Liletta insertion. No GYN concerns.  Last pap smear was in 2012 and was normal. Desires another pap smear today.   IUD Removal and Reinsertion  Patient identified, informed consent performed, consent signed.   Discussed risks of irregular bleeding, cramping, infection, malpositioning or misplacement of the IUD outside the uterus which may require further procedures. Also advised to use backup contraception for one week as the risk of pregnancy is higher during the transition period of removing an IUD and replacing it with another one. Time out was performed. Speculum placed in the vagina. The strings of the IUD were grasped and pulled using ring forceps. The IUD was successfully removed in its entirety. The cervix was cleaned with Betadine x 2 and grasped anteriorly with a single tooth tenaculum.  The new Liletta IUD insertion apparatus was used to sound the uterus to 9 cm; the IUD was then placed per manufacturer's recommendations. Strings trimmed to 3 cm. Tenaculum was removed, good hemostasis noted. Patient tolerated procedure well.   Patient was given post-procedure instructions.  She was reminded to have backup contraception for one week during this transition period between IUDs.  Patient was also asked to check IUD strings periodically and follow up in 4 weeks for IUD check.   Verita Schneiders, MD, [place] Attending [place], [facility] for [employer], [place]

## 2017-03-29 NOTE — Patient Instructions (Signed)

## 2017-03-30 ENCOUNTER — Ambulatory Visit (INDEPENDENT_AMBULATORY_CARE_PROVIDER_SITE_OTHER): Payer: Medicare Other | Admitting: Orthopedic Surgery

## 2017-04-03 LAB — CYTOLOGY - PAP
DIAGNOSIS: NEGATIVE
HPV: NOT DETECTED

## 2017-04-04 ENCOUNTER — Other Ambulatory Visit: Payer: Self-pay | Admitting: Obstetrics & Gynecology

## 2017-04-04 DIAGNOSIS — A5901 Trichomonal vulvovaginitis: Secondary | ICD-10-CM

## 2017-04-04 MED ORDER — METRONIDAZOLE 500 MG PO TABS: ORAL_TABLET | ORAL | 0 refills | Status: DC

## 2017-04-05 ENCOUNTER — Telehealth: Payer: Self-pay | Admitting: General Practice

## 2017-04-05 NOTE — Telephone Encounter (Signed)
Patient called and left message stating she received a missed call from [place] and is returning our phone call. Called patient, no answer- left message stating we are trying to reach you to return your phone call and to go over results. Please call us back

## 2017-04-05 NOTE — Telephone Encounter (Signed)
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  Recommend testing for other STIs, also needs to let partner(s) know so the partner(s) can get testing and treatment. Patient and sex partner(s) should abstain from unprotected sexual activity for seven days after everyone receives appropriate treatment.  Metronidazole was prescribed for patient.  Please call to inform patient of results and recommendations, and advise to pick up prescription.

## 2017-04-05 NOTE — Telephone Encounter (Signed)
Patient called and left message stating she just missed our phone call and is calling us back. Called patient and informed her of results and recommendations as well as medication available for pick up at pharmacy. Patient verbalized understanding and states she hasn't been sexually active in over two months. Told patient she possibly got it then but it's hard to say as she hasn't been tested in a while. Patient verbalized understanding & had no other questions

## 2017-04-10 ENCOUNTER — Telehealth (INDEPENDENT_AMBULATORY_CARE_PROVIDER_SITE_OTHER): Payer: Self-pay | Admitting: Orthopedic Surgery

## 2017-04-10 NOTE — Telephone Encounter (Signed)
That fine. Can you please have some one open up time for her and make appt. thanks

## 2017-04-10 NOTE — Telephone Encounter (Signed)
Patient states she needs to be seen tomorrow or Wednesday and that you normally work her in. She needs something after 3 and can see Sharol Given or Junie Panning. Thanks! CB # [phone]

## 2017-04-11 ENCOUNTER — Ambulatory Visit (INDEPENDENT_AMBULATORY_CARE_PROVIDER_SITE_OTHER): Payer: Medicare Other | Admitting: Orthopedic Surgery

## 2017-04-11 ENCOUNTER — Encounter (INDEPENDENT_AMBULATORY_CARE_PROVIDER_SITE_OTHER): Payer: Self-pay | Admitting: Orthopedic Surgery

## 2017-04-11 VITALS — Ht 68.0 in | Wt 321.0 lb

## 2017-04-11 DIAGNOSIS — L97919 Non-pressure chronic ulcer of unspecified part of right lower leg with unspecified severity: Secondary | ICD-10-CM

## 2017-04-11 DIAGNOSIS — I87331 Chronic venous hypertension (idiopathic) with ulcer and inflammation of right lower extremity: Secondary | ICD-10-CM | POA: Diagnosis not present

## 2017-04-11 NOTE — Progress Notes (Signed)
Office Visit Note   Patient: Alice [name]           Date of Birth: [birth year]           MRN: [id number] Visit Date: 04/11/2017              Requested by: Benito Mccreedy, MD [address], [place] [address] PCP: Benito Mccreedy, MD  Chief Complaint  Patient presents with  . Right Leg - Follow-up      HPI: Patient presents in follow-up for a chronic venous ulcer medial aspect right calf.  Patient has been using compression wraps.  She states she is trying to elevate her foot and has tried to start exercising.  Assessment & Plan: Visit Diagnoses:  1. 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  Follow-Up Instructions: Return in about 1 week (around 04/18/2017).   Ortho Exam  Patient is alert, oriented, no adenopathy, well-dressed, normal affect, normal respiratory effort. Examination patient's wound bed is healthy it is flat there is no epiboly the wound measures 10 x 4 cm and is 1 mm deep.  There is no odor no drainage no cellulitis no signs of infection.  There is essentially 90% healthy granulation tissue.  Patient would not allow me to touch the wound.  Imaging: No results found. No images are attached to the encounter.  Labs: Lab Results  Component Value Date   REPTSTATUS 01/04/2017 FINAL 12/30/2016   REPTSTATUS 01/04/2017 FINAL 12/30/2016   GRAMSTAIN  05/17/2013    MODERATE WBC PRESENT, PREDOMINANTLY MONONUCLEAR MODERATE GRAM POSITIVE COCCI IN PAIRS IN CHAINS RARE GRAM NEGATIVE RODS Gram Stain Report Called to,Read Back By and Verified With: HOPPER,M RN @ [phone] 05/17/13 LEONARD,A   GRAMSTAIN  05/17/2013    MODERATE WBC PRESENT, PREDOMINANTLY MONONUCLEAR MODERATE SQUAMOUS EPITHELIAL CELLS PRESENT MODERATE GRAM POSITIVE COCCI IN PAIRS IN CHAINS RARE GRAM NEGATIVE RODS Performed at [facility]   CULT NO  GROWTH 5 DAYS 12/30/2016   CULT NO GROWTH 5 DAYS 12/30/2016   LABORGA ESCHERICHIA COLI (A) 12/29/2016    @LABSALLVALUES (HGBA1)@  @BMI1 @  Orders:  No orders of the defined types were placed in this encounter.  No orders of the defined types were placed in this encounter.    Procedures: No procedures performed  Clinical Data: No additional findings.  ROS:  All other systems negative, except as noted in the HPI. Review of Systems  Objective: Vital Signs: Ht 5\' 8"  (1.727 m)   Wt (!) 321 lb (145.6 kg)   BMI 48.81 kg/m   Specialty Comments:  No specialty comments available.  PMFS History: Patient Active Problem List   Diagnosis Date Noted  . Acute lower UTI 12/29/2016  . Sepsis ([place]) 12/29/2016  . Acute kidney injury superimposed on CKD ([place]) 12/28/2016  . Atypical chest pain 12/28/2016  . Hx of skin graft 08/01/2016  . 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 ([place]) 07/25/2016  . 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 Trichomonal infection 11/24/2015  . Abdominal pain 05/02/2015  . Symptomatic cholelithiasis 04/16/2015  . Acute bilateral upper abdominal pain 04/16/2015  . Microcytic anemia 05/18/2013  . Hypotension, unspecified 05/17/2013  . CKD (chronic kidney disease), stage III ([place]) 05/17/2013  . Hypokalemia 05/17/2013  . Anal fissure 02/06/2013  . Anal skin tag 02/06/2013  .  ALLERGIC RHINITIS 03/05/2010  . LOW BACK PAIN SYNDROME 12/13/2007  . WOUND INFECTION 10/10/2007  . OBESITY, MORBID 12/02/2006  . HTN (hypertension) 12/02/2006  . History of pulmonary embolism 12/02/2006  . History of DVT (deep vein thrombosis) 12/02/2006  . 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 GASTROESOPHAGEAL REFLUX DISEASE 12/02/2006  . OSA (obstructive sleep apnea) 12/02/2006   Past Medical History:  Diagnosis Date  . Anemia   . Anxiety   . Chronic bronchitis ([place])   .  Chronic upper back pain   . Depression   . DVT (deep venous thrombosis) (HCC)    BLE  . GERD (gastroesophageal reflux disease)   . Headache    "weekly" (04/16/2015)  . Hypertension   . Migraine    "monthly" (04/16/2015)  . Obesity   . Pneumonia 2014  . Pulmonary embolism ([place])   . Sinusitis nasal   . Sleep apnea    "I'm suppose to wear a mask but I don't" (04/16/2015)  . Varicose veins of right lower extremity   . Venous stasis of lower extremity    right    Family History  Problem Relation Age of Onset  . Kidney disease Mother        kidney transplant    Past Surgical History:  Procedure Laterality Date  . CHOLECYSTECTOMY N/A 04/18/2015   Procedure: LAPAROSCOPIC CHOLECYSTECTOMY;  Surgeon: Ralene Ok, MD;  Location: [facility];  Service: General;  Laterality: N/A;  . HYSTEROSCOPY W/D&C  12/24/2001   Archie Endo 09/28/2010  . I&D EXTREMITY Right 07/25/2016   Procedure: IRRIGATION AND DEBRIDEMENT RIGHT LEG ULCER, APPLY VERAFLO VAC;  Surgeon: [name], MD;  Location: [facility];  Service: Orthopedics;  Laterality: Right;  . INCISE AND DRAIN ABCESS Right 07/14/2016  . INCISION AND DRAINAGE Right 09/10/2008   leg:  skin and soft tissue and muscle/notes 09/15/2010  . INCISION AND DRAINAGE Right 01/01/2008   Chronic venous stasis insufficiency ulcer,/notes 09/14/2010/  . INCISION AND DRAINAGE Right 09/1882   calf w/application wound vac/notes 07/20/2010  . INCISION AND DRAINAGE Right 07/25/2016   IRRIGATION AND DEBRIDEMENT RIGHT LEG ULCER,  . LAPAROSCOPIC GASTRIC BYPASS  ~ 2007  . SKIN GRAFT SPLIT THICKNESS LEG / FOOT Right 07/25/2016   LEG  . SKIN SPLIT GRAFT Right 07/27/2016   Procedure: SKIN GRAFT RIGHT LEG WITH THERASKIN APPLICATION;  Surgeon: [name], MD;  Location: [facility];  Service: Orthopedics;  Laterality: Right;  . TONSILLECTOMY     Social History   Occupational History  . Not on file  Tobacco Use  . Smoking status: Never Smoker  . Smokeless tobacco: Never Used  Substance and  Sexual Activity  . Alcohol use: No  . Drug use: No  . Sexual activity: Yes    Partners: Male    Birth control/protection: IUD

## 2017-04-12 DIAGNOSIS — Z7901 Long term (current) use of anticoagulants: Secondary | ICD-10-CM | POA: Diagnosis not present

## 2017-04-12 DIAGNOSIS — I10 Essential (primary) hypertension: Secondary | ICD-10-CM | POA: Diagnosis not present

## 2017-04-12 DIAGNOSIS — G629 Polyneuropathy, unspecified: Secondary | ICD-10-CM | POA: Diagnosis not present

## 2017-04-12 DIAGNOSIS — E559 Vitamin D deficiency, unspecified: Secondary | ICD-10-CM | POA: Diagnosis not present

## 2017-04-12 DIAGNOSIS — F329 Major depressive disorder, single episode, unspecified: Secondary | ICD-10-CM | POA: Diagnosis not present

## 2017-04-12 DIAGNOSIS — R7303 Prediabetes: Secondary | ICD-10-CM | POA: Diagnosis not present

## 2017-04-12 DIAGNOSIS — R1013 Epigastric pain: Secondary | ICD-10-CM | POA: Diagnosis not present

## 2017-04-12 DIAGNOSIS — G4733 Obstructive sleep apnea (adult) (pediatric): Secondary | ICD-10-CM | POA: Diagnosis not present

## 2017-04-18 ENCOUNTER — Ambulatory Visit (INDEPENDENT_AMBULATORY_CARE_PROVIDER_SITE_OTHER): Payer: Medicare Other | Admitting: Orthopedic Surgery

## 2017-04-18 ENCOUNTER — Ambulatory Visit (INDEPENDENT_AMBULATORY_CARE_PROVIDER_SITE_OTHER): Payer: Medicare Other

## 2017-04-26 ENCOUNTER — Ambulatory Visit (INDEPENDENT_AMBULATORY_CARE_PROVIDER_SITE_OTHER): Payer: Medicare Other | Admitting: Family

## 2017-05-02 ENCOUNTER — Encounter (INDEPENDENT_AMBULATORY_CARE_PROVIDER_SITE_OTHER): Payer: Self-pay | Admitting: Orthopedic Surgery

## 2017-05-02 ENCOUNTER — Ambulatory Visit (INDEPENDENT_AMBULATORY_CARE_PROVIDER_SITE_OTHER): Payer: Medicare Other | Admitting: Orthopedic Surgery

## 2017-05-02 VITALS — Ht 68.0 in | Wt 321.0 lb

## 2017-05-02 DIAGNOSIS — L97919 Non-pressure chronic ulcer of unspecified part of right lower leg with unspecified severity: Secondary | ICD-10-CM

## 2017-05-02 DIAGNOSIS — I83019 Varicose veins of right lower extremity with ulcer of unspecified site: Secondary | ICD-10-CM

## 2017-05-02 NOTE — Progress Notes (Signed)
Office Visit Note   Patient: Alice [name]           Date of Birth: Nov 02, 1970           MRN: [id number] Visit Date: 05/02/2017              Requested by: Benito Mccreedy, MD [address] [place], [place] [address] PCP: Benito Mccreedy, MD  Chief Complaint  Patient presents with  . Right Leg - Wound Check      HPI: Patient presents in follow-up for chronic venous ulcer right leg undergoing compression wraps she is making slow steady improvement.  We will apply Dynaflex wrap plus used 1 of the medical compression stockings against the wound.  Assessment & Plan: Visit Diagnoses:  1. 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 (HCC)     Plan: Showing an excellent improvement in the wound healing.  Follow-Up Instructions: Return in about 1 week (around 05/09/2017).   Ortho Exam  Patient is alert, oriented, no adenopathy, well-dressed, normal affect, normal respiratory effort. Examination patient's wound has good epithelialization around the wound edges there is good granulation tissue in the wound bed there is no drainage no cellulitis no odor no signs of infection.  Imaging: No results found. No images are attached to the encounter.  Labs: Lab Results  Component Value Date   REPTSTATUS 01/04/2017 FINAL 12/30/2016   REPTSTATUS 01/04/2017 FINAL 12/30/2016   GRAMSTAIN  05/17/2013    MODERATE WBC PRESENT, PREDOMINANTLY MONONUCLEAR MODERATE GRAM POSITIVE COCCI IN PAIRS IN CHAINS RARE GRAM NEGATIVE RODS Gram Stain Report Called to,Read Back By and Verified With: HOPPER,M RN @ [phone] 05/17/13 LEONARD,A   GRAMSTAIN  05/17/2013    MODERATE WBC PRESENT, PREDOMINANTLY MONONUCLEAR MODERATE SQUAMOUS EPITHELIAL CELLS PRESENT MODERATE GRAM POSITIVE COCCI IN PAIRS IN CHAINS RARE GRAM NEGATIVE RODS Performed at [facility]   CULT NO GROWTH 5 DAYS 12/30/2016   CULT NO GROWTH 5 DAYS 12/30/2016   LABORGA ESCHERICHIA COLI (A) 12/29/2016     @LABSALLVALUES (HGBA1)@  Body mass index is 48.81 kg/m.  Orders:  No orders of the defined types were placed in this encounter.  No orders of the defined types were placed in this encounter.    Procedures: No procedures performed  Clinical Data: No additional findings.  ROS:  All other systems negative, except as noted in the HPI. Review of Systems  Objective: Vital Signs: Ht 5\' 8"  (1.727 m)   Wt (!) 321 lb (145.6 kg)   BMI 48.81 kg/m   Specialty Comments:  No specialty comments available.  PMFS History: Patient Active Problem List   Diagnosis Date Noted  . Acute lower UTI 12/29/2016  . Sepsis ([place]) 12/29/2016  . Acute kidney injury superimposed on CKD ([place]) 12/28/2016  . Atypical chest pain 12/28/2016  . Hx of skin graft 08/01/2016  . 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 ([place]) 07/25/2016  . 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) 07/05/2016  . Trichomonal infection 11/24/2015  . Abdominal pain 05/02/2015  . Symptomatic cholelithiasis 04/16/2015  . Acute bilateral upper abdominal pain 04/16/2015  . Microcytic anemia 05/18/2013  . Hypotension, unspecified 05/17/2013  . CKD (chronic kidney disease), stage III ([place]) 05/17/2013  . Hypokalemia 05/17/2013  . Anal fissure 02/06/2013  . Anal skin tag 02/06/2013  . ALLERGIC RHINITIS 03/05/2010  . LOW BACK PAIN SYNDROME 12/13/2007  . WOUND INFECTION 10/10/2007  . OBESITY, MORBID 12/02/2006  .  HTN (hypertension) 12/02/2006  . History of pulmonary embolism 12/02/2006  . History of DVT (deep vein thrombosis) 12/02/2006  . 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 GASTROESOPHAGEAL REFLUX DISEASE 12/02/2006  . OSA (obstructive sleep apnea) 12/02/2006   Past Medical History:  Diagnosis Date  . Anemia   . Anxiety   . Chronic bronchitis ([place])   . Chronic upper back pain   . Depression   . DVT (deep venous thrombosis)  (HCC)    BLE  . GERD (gastroesophageal reflux disease)   . Headache    "weekly" (04/16/2015)  . Hypertension   . Migraine    "monthly" (04/16/2015)  . Obesity   . Pneumonia 2014  . Pulmonary embolism ([place])   . Sinusitis nasal   . Sleep apnea    "I'm suppose to wear a mask but I don't" (04/16/2015)  . Varicose veins of right lower extremity   . Venous stasis of lower extremity    right    Family History  Problem Relation Age of Onset  . Kidney disease Mother        kidney transplant    Past Surgical History:  Procedure Laterality Date  . CHOLECYSTECTOMY N/A 04/18/2015   Procedure: LAPAROSCOPIC CHOLECYSTECTOMY;  Surgeon: Ralene Ok, MD;  Location: [facility];  Service: General;  Laterality: N/A;  . HYSTEROSCOPY W/D&C  12/24/2001   Archie Endo 09/28/2010  . I&D EXTREMITY Right 07/25/2016   Procedure: IRRIGATION AND DEBRIDEMENT RIGHT LEG ULCER, APPLY VERAFLO VAC;  Surgeon: [name], MD;  Location: [facility];  Service: Orthopedics;  Laterality: Right;  . INCISE AND DRAIN ABCESS Right 07/14/2016  . INCISION AND DRAINAGE Right 09/10/2008   leg:  skin and soft tissue and muscle/notes 09/15/2010  . INCISION AND DRAINAGE Right 01/01/2008   Chronic venous stasis insufficiency ulcer,/notes 09/14/2010/  . INCISION AND DRAINAGE Right 11/1694   calf w/application wound vac/notes 07/20/2010  . INCISION AND DRAINAGE Right 07/25/2016   IRRIGATION AND DEBRIDEMENT RIGHT LEG ULCER,  . LAPAROSCOPIC GASTRIC BYPASS  ~ 2007  . SKIN GRAFT SPLIT THICKNESS LEG / FOOT Right 07/25/2016   LEG  . SKIN SPLIT GRAFT Right 07/27/2016   Procedure: SKIN GRAFT RIGHT LEG WITH THERASKIN APPLICATION;  Surgeon: [name], MD;  Location: [facility];  Service: Orthopedics;  Laterality: Right;  . TONSILLECTOMY     Social History   Occupational History  . Not on file  Tobacco Use  . Smoking status: Never Smoker  . Smokeless tobacco: Never Used  Substance and Sexual Activity  . Alcohol use: No  . Drug use: No  . Sexual activity:  Yes    Partners: Male    Birth control/protection: IUD

## 2017-05-10 ENCOUNTER — Ambulatory Visit: Payer: Medicare Other | Admitting: Obstetrics & Gynecology

## 2017-05-10 ENCOUNTER — Ambulatory Visit (INDEPENDENT_AMBULATORY_CARE_PROVIDER_SITE_OTHER): Payer: Medicare Other

## 2017-05-19 ENCOUNTER — Ambulatory Visit (INDEPENDENT_AMBULATORY_CARE_PROVIDER_SITE_OTHER): Payer: Medicare Other | Admitting: Family

## 2017-05-24 ENCOUNTER — Encounter (INDEPENDENT_AMBULATORY_CARE_PROVIDER_SITE_OTHER): Payer: Self-pay | Admitting: Family

## 2017-05-24 ENCOUNTER — Ambulatory Visit (INDEPENDENT_AMBULATORY_CARE_PROVIDER_SITE_OTHER): Payer: Medicare Other | Admitting: Family

## 2017-05-24 VITALS — Ht 68.0 in | Wt 321.0 lb

## 2017-05-24 DIAGNOSIS — L97919 Non-pressure chronic ulcer of unspecified part of right lower leg with unspecified severity: Secondary | ICD-10-CM

## 2017-05-24 DIAGNOSIS — I87331 Chronic venous hypertension (idiopathic) with ulcer and inflammation of right lower extremity: Secondary | ICD-10-CM | POA: Diagnosis not present

## 2017-05-24 NOTE — Progress Notes (Signed)
Office Visit Note   Patient: Alice [name]           Date of Birth: [birth year]           MRN: [id number] Visit Date: 05/02/2017              Requested by: Benito Mccreedy, MD [address] [place], [place] [address] PCP: Benito Mccreedy, MD  Chief Complaint  Patient presents with  . Right Leg - Wound Check      HPI: Patient presents in follow-up for chronic venous ulcer right leg undergoing compression wraps. she is making slow steady improvement with Dynaflex wrap plus pieces of medical compression stockings against the wound.  Assessment & Plan: Visit Diagnoses:  1. 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  Follow-Up Instructions: Return in about 1 week (around 05/09/2017).   Ortho Exam  Patient is alert, oriented, no adenopathy, well-dressed, normal affect, normal respiratory effort. Examination patient's wound has good epithelialization around the wound edges. Is now 11.5 x 9 cm in size. No depth. there is good granulation tissue in the wound bed. there is no drainage no cellulitis no odor no signs of infection.  Imaging: No results found. No images are attached to the encounter.  Labs: Lab Results  Component Value Date   REPTSTATUS 01/04/2017 FINAL 12/30/2016   REPTSTATUS 01/04/2017 FINAL 12/30/2016   GRAMSTAIN  05/17/2013    MODERATE WBC PRESENT, PREDOMINANTLY MONONUCLEAR MODERATE GRAM POSITIVE COCCI IN PAIRS IN CHAINS RARE GRAM NEGATIVE RODS Gram Stain Report Called to,Read Back By and Verified With: HOPPER,M RN @ [phone] 05/17/13 LEONARD,A   GRAMSTAIN  05/17/2013    MODERATE WBC PRESENT, PREDOMINANTLY MONONUCLEAR MODERATE SQUAMOUS EPITHELIAL CELLS PRESENT MODERATE GRAM POSITIVE COCCI IN PAIRS IN CHAINS RARE GRAM NEGATIVE RODS Performed at [facility]   CULT NO GROWTH 5 DAYS 12/30/2016   CULT NO GROWTH 5 DAYS  12/30/2016   LABORGA ESCHERICHIA COLI (A) 12/29/2016    @LABSALLVALUES (HGBA1)@  Body mass index is 48.81 kg/m.  Orders:  No orders of the defined types were placed in this encounter.  No orders of the defined types were placed in this encounter.    Procedures: No procedures performed  Clinical Data: No additional findings.  ROS:  All other systems negative, except as noted in the HPI. Review of Systems  Objective: Vital Signs: Ht 5\' 8"  (1.727 m)   Wt (!) 321 lb (145.6 kg)   BMI 48.81 kg/m   Specialty Comments:  No specialty comments available.  PMFS History: Patient Active Problem List   Diagnosis Date Noted  . Acute lower UTI 12/29/2016  . Sepsis ([place]) 12/29/2016  . Acute kidney injury superimposed on CKD ([place]) 12/28/2016  . Atypical chest pain 12/28/2016  . Hx of skin graft 08/01/2016  . 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 ([place]) 07/25/2016  . 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 Trichomonal infection 11/24/2015  . Abdominal pain 05/02/2015  . Symptomatic cholelithiasis 04/16/2015  . Acute bilateral upper abdominal pain 04/16/2015  . Microcytic anemia 05/18/2013  . Hypotension, unspecified 05/17/2013  . CKD (chronic kidney disease), stage III ([place]) 05/17/2013  . Hypokalemia 05/17/2013  . Anal fissure 02/06/2013  . Anal skin tag 02/06/2013  . ALLERGIC RHINITIS 03/05/2010  . LOW BACK PAIN SYNDROME 12/13/2007  .  WOUND INFECTION 10/10/2007  . OBESITY, MORBID 12/02/2006  . HTN (hypertension) 12/02/2006  . History of pulmonary embolism 12/02/2006  . History of DVT (deep vein thrombosis) 12/02/2006  . 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 GASTROESOPHAGEAL REFLUX DISEASE 12/02/2006  . OSA (obstructive sleep apnea) 12/02/2006   Past Medical History:  Diagnosis Date  . Anemia   . Anxiety   . Chronic bronchitis ([place])   . Chronic upper back pain    . Depression   . DVT (deep venous thrombosis) (HCC)    BLE  . GERD (gastroesophageal reflux disease)   . Headache    "weekly" (04/16/2015)  . Hypertension   . Migraine    "monthly" (04/16/2015)  . Obesity   . Pneumonia 2014  . Pulmonary embolism ([place])   . Sinusitis nasal   . Sleep apnea    "I'm suppose to wear a mask but I don't" (04/16/2015)  . Varicose veins of right lower extremity   . Venous stasis of lower extremity    right    Family History  Problem Relation Age of Onset  . Kidney disease Mother        kidney transplant    Past Surgical History:  Procedure Laterality Date  . CHOLECYSTECTOMY N/A 04/18/2015   Procedure: LAPAROSCOPIC CHOLECYSTECTOMY;  Surgeon: Ralene Ok, MD;  Location: [facility];  Service: General;  Laterality: N/A;  . HYSTEROSCOPY W/D&C  12/24/2001   Archie Endo 09/28/2010  . I&D EXTREMITY Right 07/25/2016   Procedure: IRRIGATION AND DEBRIDEMENT RIGHT LEG ULCER, APPLY VERAFLO VAC;  Surgeon: [name], MD;  Location: [facility];  Service: Orthopedics;  Laterality: Right;  . INCISE AND DRAIN ABCESS Right 07/14/2016  . INCISION AND DRAINAGE Right 09/10/2008   leg:  skin and soft tissue and muscle/notes 09/15/2010  . INCISION AND DRAINAGE Right 01/01/2008   Chronic venous stasis insufficiency ulcer,/notes 09/14/2010/  . INCISION AND DRAINAGE Right 05/3141   calf w/application wound vac/notes 07/20/2010  . INCISION AND DRAINAGE Right 07/25/2016   IRRIGATION AND DEBRIDEMENT RIGHT LEG ULCER,  . LAPAROSCOPIC GASTRIC BYPASS  ~ 2007  . SKIN GRAFT SPLIT THICKNESS LEG / FOOT Right 07/25/2016   LEG  . SKIN SPLIT GRAFT Right 07/27/2016   Procedure: SKIN GRAFT RIGHT LEG WITH THERASKIN APPLICATION;  Surgeon: [name], MD;  Location: [facility];  Service: Orthopedics;  Laterality: Right;  . TONSILLECTOMY     Social History   Occupational History  . Not on file  Tobacco Use  . Smoking status: Never Smoker  . Smokeless tobacco: Never Used  Substance and Sexual Activity  .  Alcohol use: No  . Drug use: No  . Sexual activity: Yes    Partners: Male    Birth control/protection: IUD

## 2017-05-30 DIAGNOSIS — Z7901 Long term (current) use of anticoagulants: Secondary | ICD-10-CM | POA: Diagnosis not present

## 2017-05-30 DIAGNOSIS — E559 Vitamin D deficiency, unspecified: Secondary | ICD-10-CM | POA: Diagnosis not present

## 2017-05-30 DIAGNOSIS — F329 Major depressive disorder, single episode, unspecified: Secondary | ICD-10-CM | POA: Diagnosis not present

## 2017-05-30 DIAGNOSIS — R1013 Epigastric pain: Secondary | ICD-10-CM | POA: Diagnosis not present

## 2017-05-30 DIAGNOSIS — R7303 Prediabetes: Secondary | ICD-10-CM | POA: Diagnosis not present

## 2017-05-30 DIAGNOSIS — G4733 Obstructive sleep apnea (adult) (pediatric): Secondary | ICD-10-CM | POA: Diagnosis not present

## 2017-05-30 DIAGNOSIS — G629 Polyneuropathy, unspecified: Secondary | ICD-10-CM | POA: Diagnosis not present

## 2017-05-30 DIAGNOSIS — I10 Essential (primary) hypertension: Secondary | ICD-10-CM | POA: Diagnosis not present

## 2017-05-31 ENCOUNTER — Other Ambulatory Visit (INDEPENDENT_AMBULATORY_CARE_PROVIDER_SITE_OTHER): Payer: Self-pay

## 2017-05-31 ENCOUNTER — Ambulatory Visit (INDEPENDENT_AMBULATORY_CARE_PROVIDER_SITE_OTHER): Payer: Medicare Other

## 2017-05-31 VITALS — Ht 68.0 in | Wt 321.0 lb

## 2017-05-31 DIAGNOSIS — I87331 Chronic venous hypertension (idiopathic) with ulcer and inflammation of right lower extremity: Secondary | ICD-10-CM | POA: Diagnosis not present

## 2017-05-31 DIAGNOSIS — L97919 Non-pressure chronic ulcer of unspecified part of right lower leg with unspecified severity: Secondary | ICD-10-CM

## 2017-05-31 MED ORDER — TRAMADOL HCL 50 MG PO TABS: 50.0000 mg | ORAL_TABLET | Freq: Two times a day (BID) | ORAL | 0 refills | Status: DC | PRN

## 2017-06-01 NOTE — Progress Notes (Signed)
Right lower extremity calf ulcer. Decreased swelling, no odor, minimal drainage. The wound has good beefy granulation tissue.  Reapplied piece of medical compression socks direct contact with wound and dynaflex compression. RX for tramadol provided to the pt by Benita Stabile, PA for pain. Pt states that she has been up on her feet a little more than usual and she was experiencing increased pain due to this.

## 2017-06-02 ENCOUNTER — Encounter (INDEPENDENT_AMBULATORY_CARE_PROVIDER_SITE_OTHER): Payer: Self-pay

## 2017-06-07 ENCOUNTER — Encounter (INDEPENDENT_AMBULATORY_CARE_PROVIDER_SITE_OTHER): Payer: Self-pay | Admitting: Family

## 2017-06-07 ENCOUNTER — Ambulatory Visit (INDEPENDENT_AMBULATORY_CARE_PROVIDER_SITE_OTHER): Payer: Medicare Other | Admitting: Family

## 2017-06-07 VITALS — Ht 68.0 in | Wt 321.0 lb

## 2017-06-07 DIAGNOSIS — L97919 Non-pressure chronic ulcer of unspecified part of right lower leg with unspecified severity: Secondary | ICD-10-CM

## 2017-06-07 DIAGNOSIS — Z945 Skin transplant status: Secondary | ICD-10-CM

## 2017-06-07 DIAGNOSIS — I87331 Chronic venous hypertension (idiopathic) with ulcer and inflammation of right lower extremity: Secondary | ICD-10-CM | POA: Diagnosis not present

## 2017-06-07 NOTE — Progress Notes (Signed)
Office Visit Note   Patient: Alice [name]           Date of Birth: 1970/06/19           MRN: [id number] Visit Date: 05/02/2017              Requested by: Benito Mccreedy, MD [address] [place], [place] [address] PCP: Benito Mccreedy, MD  Chief Complaint  Patient presents with  . Right Leg - Wound Check      HPI: Patient presents in follow-up for chronic venous ulcer right leg undergoing dynaflex compression wraps. she is making slow improvement with Dynaflex wrap plus pieces of medical compression stockings against the wound.  Assessment & Plan: Visit Diagnoses:  1. 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  Follow-Up Instructions: Return in about 1 week (around 05/09/2017).   Ortho Exam  Patient is alert, oriented, no adenopathy, well-dressed, normal affect, normal respiratory effort. Examination patient's wound has good epithelialization around the wound edges. No depth. there is good granulation tissue in the wound bed. Scant exudate. there is no drainage no cellulitis no odor no signs of infection.  Imaging: No results found. No images are attached to the encounter.  Labs: Lab Results  Component Value Date   REPTSTATUS 01/04/2017 FINAL 12/30/2016   REPTSTATUS 01/04/2017 FINAL 12/30/2016   GRAMSTAIN  05/17/2013    MODERATE WBC PRESENT, PREDOMINANTLY MONONUCLEAR MODERATE GRAM POSITIVE COCCI IN PAIRS IN CHAINS RARE GRAM NEGATIVE RODS Gram Stain Report Called to,Read Back By and Verified With: HOPPER,M RN @ [phone] 05/17/13 LEONARD,A   GRAMSTAIN  05/17/2013    MODERATE WBC PRESENT, PREDOMINANTLY MONONUCLEAR MODERATE SQUAMOUS EPITHELIAL CELLS PRESENT MODERATE GRAM POSITIVE COCCI IN PAIRS IN CHAINS RARE GRAM NEGATIVE RODS Performed at [facility]   CULT NO GROWTH 5 DAYS 12/30/2016   CULT NO GROWTH 5 DAYS 12/30/2016   LABORGA ESCHERICHIA COLI (A) 12/29/2016    @LABSALLVALUES (HGBA1)@  Body mass index is 48.81 kg/m.  Orders:  No orders of the defined types were placed in this encounter.  No orders of the defined types were placed in this encounter.    Procedures: No procedures performed  Clinical Data: No additional findings.  ROS:  All other systems negative, except as noted in the HPI. Review of Systems  Objective: Vital Signs: Ht 5\' 8"  (1.727 m)   Wt (!) 321 lb (145.6 kg)   BMI 48.81 kg/m   Specialty Comments:  No specialty comments available.  PMFS History: Patient Active Problem List   Diagnosis Date Noted  . Acute lower UTI 12/29/2016  . Sepsis ([place]) 12/29/2016  . Acute kidney injury superimposed on CKD ([place]) 12/28/2016  . Atypical chest pain 12/28/2016  . Hx of skin graft 08/01/2016  . 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 ([place]) 07/05/2016  . Trichomonal infection 11/24/2015  . Abdominal pain 05/02/2015  . Symptomatic cholelithiasis 04/16/2015  . Acute bilateral upper abdominal pain 04/16/2015  . Microcytic anemia 05/18/2013  . Hypotension, unspecified 05/17/2013  . CKD (chronic kidney disease), stage III ([place]) 05/17/2013  . Hypokalemia 05/17/2013  . Anal fissure 02/06/2013  . Anal skin tag 02/06/2013  . ALLERGIC RHINITIS 03/05/2010  . LOW BACK PAIN SYNDROME 12/13/2007  . WOUND INFECTION 10/10/2007  . OBESITY, MORBID  12/02/2006  . HTN (hypertension) 12/02/2006  . History of pulmonary embolism 12/02/2006  . History of DVT (deep vein thrombosis) 12/02/2006  . 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 GASTROESOPHAGEAL REFLUX DISEASE 12/02/2006  . OSA (obstructive sleep apnea) 12/02/2006   Past Medical History:  Diagnosis Date  . Anemia   . Anxiety   . Chronic bronchitis ([place])   . Chronic upper back pain   .  Depression   . DVT (deep venous thrombosis) (HCC)    BLE  . GERD (gastroesophageal reflux disease)   . Headache    "weekly" (04/16/2015)  . Hypertension   . Migraine    "monthly" (04/16/2015)  . Obesity   . Pneumonia 2014  . Pulmonary embolism ([place])   . Sinusitis nasal   . Sleep apnea    "I'm suppose to wear a mask but I don't" (04/16/2015)  . Varicose veins of right lower extremity   . Venous stasis of lower extremity    right    Family History  Problem Relation Age of Onset  . Kidney disease Mother        kidney transplant    Past Surgical History:  Procedure Laterality Date  . CHOLECYSTECTOMY N/A 04/18/2015   Procedure: LAPAROSCOPIC CHOLECYSTECTOMY;  Surgeon: Ralene Ok, MD;  Location: [facility];  Service: General;  Laterality: N/A;  . HYSTEROSCOPY W/D&C  12/24/2001   Archie Endo 09/28/2010  . I&D EXTREMITY Right 07/25/2016   Procedure: IRRIGATION AND DEBRIDEMENT RIGHT LEG ULCER, APPLY VERAFLO VAC;  Surgeon: [name], MD;  Location: [facility];  Service: Orthopedics;  Laterality: Right;  . INCISE AND DRAIN ABCESS Right 07/14/2016  . INCISION AND DRAINAGE Right 09/10/2008   leg:  skin and soft tissue and muscle/notes 09/15/2010  . INCISION AND DRAINAGE Right 01/01/2008   Chronic venous stasis insufficiency ulcer,/notes 09/14/2010/  . INCISION AND DRAINAGE Right 08/8014   calf w/application wound vac/notes 07/20/2010  . INCISION AND DRAINAGE Right 07/25/2016   IRRIGATION AND DEBRIDEMENT RIGHT LEG ULCER,  . LAPAROSCOPIC GASTRIC BYPASS  ~ 2007  . SKIN GRAFT SPLIT THICKNESS LEG / FOOT Right 07/25/2016   LEG  . SKIN SPLIT GRAFT Right 07/27/2016   Procedure: SKIN GRAFT RIGHT LEG WITH THERASKIN APPLICATION;  Surgeon: [name], MD;  Location: [facility];  Service: Orthopedics;  Laterality: Right;  . TONSILLECTOMY     Social History   Occupational History  . Not on file  Tobacco Use  . Smoking status: Never Smoker  . Smokeless tobacco: Never Used  Substance and Sexual Activity  . Alcohol  use: No  . Drug use: No  . Sexual activity: Yes    Partners: Male    Birth control/protection: IUD

## 2017-06-08 DIAGNOSIS — H04123 Dry eye syndrome of bilateral lacrimal glands: Secondary | ICD-10-CM | POA: Diagnosis not present

## 2017-06-08 DIAGNOSIS — H18213 Corneal edema secondary to contact lens, bilateral: Secondary | ICD-10-CM | POA: Diagnosis not present

## 2017-06-08 DIAGNOSIS — S0500XA Injury of conjunctiva and corneal abrasion without foreign body, unspecified eye, initial encounter: Secondary | ICD-10-CM | POA: Diagnosis not present

## 2017-06-12 ENCOUNTER — Other Ambulatory Visit (HOSPITAL_COMMUNITY): Payer: Medicare Other

## 2017-06-14 ENCOUNTER — Encounter (INDEPENDENT_AMBULATORY_CARE_PROVIDER_SITE_OTHER): Payer: Self-pay | Admitting: Family

## 2017-06-14 ENCOUNTER — Ambulatory Visit (INDEPENDENT_AMBULATORY_CARE_PROVIDER_SITE_OTHER): Payer: Medicare Other | Admitting: Family

## 2017-06-14 DIAGNOSIS — I87331 Chronic venous hypertension (idiopathic) with ulcer and inflammation of right lower extremity: Secondary | ICD-10-CM | POA: Diagnosis not present

## 2017-06-14 DIAGNOSIS — L97919 Non-pressure chronic ulcer of unspecified part of right lower leg with unspecified severity: Secondary | ICD-10-CM | POA: Diagnosis not present

## 2017-06-14 DIAGNOSIS — Z945 Skin transplant status: Secondary | ICD-10-CM

## 2017-06-14 NOTE — Progress Notes (Signed)
Office Visit Note   Patient: Alice [name]           Date of Birth: 1971-04-09           MRN: [id number] Visit Date: 05/02/2017              Requested by: Benito Mccreedy, MD [address] [place], [place] [address] PCP: Benito Mccreedy, MD  Chief Complaint  Patient presents with  . Right Leg - Wound Check      HPI: Patient presents in follow-up for chronic venous ulcer right leg undergoing dynaflex compression wraps. she is having serial Dynaflex wrap plus pieces of medical compression stockings against the wound.  Assessment & Plan: Visit Diagnoses:  1. 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 Will continue with current wound care, dynaflex and medical compression stocking pieces.  Follow-Up Instructions: Return in about 1 week (around 05/09/2017).   Ortho Exam  Patient is alert, oriented, no adenopathy, well-dressed, normal affect, normal respiratory effort. Examination patient's wound has good epithelialization around the wound edges. No depth. there is good granulation tissue in the wound bed. Increased fibrinous tissue this week. Ulcer is no 10.5 x 12 cm in size. 2 mm deeop. there is no drainage no cellulitis no odor no signs of infection.  Imaging: No results found. No images are attached to the encounter.  Labs: Lab Results  Component Value Date   REPTSTATUS 01/04/2017 FINAL 12/30/2016   REPTSTATUS 01/04/2017 FINAL 12/30/2016   GRAMSTAIN  05/17/2013    MODERATE WBC PRESENT, PREDOMINANTLY MONONUCLEAR MODERATE GRAM POSITIVE COCCI IN PAIRS IN CHAINS RARE GRAM NEGATIVE RODS Gram Stain Report Called to,Read Back By and Verified With: HOPPER,M RN @ [phone] 05/17/13 LEONARD,A   GRAMSTAIN  05/17/2013    MODERATE WBC PRESENT, PREDOMINANTLY MONONUCLEAR MODERATE SQUAMOUS EPITHELIAL CELLS PRESENT MODERATE GRAM POSITIVE COCCI IN PAIRS IN CHAINS RARE GRAM NEGATIVE  RODS Performed at [facility]   CULT NO GROWTH 5 DAYS 12/30/2016   CULT NO GROWTH 5 DAYS 12/30/2016   LABORGA ESCHERICHIA COLI (A) 12/29/2016    @LABSALLVALUES (HGBA1)@  Body mass index is 48.81 kg/m.  Orders:  No orders of the defined types were placed in this encounter.  No orders of the defined types were placed in this encounter.    Procedures: No procedures performed  Clinical Data: No additional findings.  ROS:  All other systems negative, except as noted in the HPI. Review of Systems  Objective: Vital Signs: Ht 5\' 8"  (1.727 m)   Wt (!) 321 lb (145.6 kg)   BMI 48.81 kg/m   Specialty Comments:  No specialty comments available.  PMFS History: Patient Active Problem List   Diagnosis Date Noted  . Acute lower UTI 12/29/2016  . Sepsis ([place]) 12/29/2016  . Acute kidney injury superimposed on CKD ([place]) 12/28/2016  . Atypical chest pain 12/28/2016  . Hx of skin graft 08/01/2016  . 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 ([place]) 07/05/2016  . Trichomonal infection 11/24/2015  . Abdominal pain 05/02/2015  . Symptomatic cholelithiasis 04/16/2015  . Acute bilateral upper abdominal pain 04/16/2015  . Microcytic anemia 05/18/2013  . Hypotension, unspecified 05/17/2013  . CKD (chronic kidney disease), stage III ([place]) 05/17/2013  . Hypokalemia 05/17/2013  . Anal fissure 02/06/2013  . Anal  skin tag 02/06/2013  . ALLERGIC RHINITIS 03/05/2010  . LOW BACK PAIN SYNDROME 12/13/2007  . WOUND INFECTION 10/10/2007  . OBESITY, MORBID 12/02/2006  . HTN (hypertension) 12/02/2006  . History of pulmonary embolism 12/02/2006  . History of DVT (deep vein thrombosis) 12/02/2006  . 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 GASTROESOPHAGEAL REFLUX DISEASE 12/02/2006  . OSA (obstructive sleep apnea) 12/02/2006   Past Medical History:    Diagnosis Date  . Anemia   . Anxiety   . Chronic bronchitis ([place])   . Chronic upper back pain   . Depression   . DVT (deep venous thrombosis) (HCC)    BLE  . GERD (gastroesophageal reflux disease)   . Headache    "weekly" (04/16/2015)  . Hypertension   . Migraine    "monthly" (04/16/2015)  . Obesity   . Pneumonia 2014  . Pulmonary embolism ([place])   . Sinusitis nasal   . Sleep apnea    "I'm suppose to wear a mask but I don't" (04/16/2015)  . Varicose veins of right lower extremity   . Venous stasis of lower extremity    right    Family History  Problem Relation Age of Onset  . Kidney disease Mother        kidney transplant    Past Surgical History:  Procedure Laterality Date  . CHOLECYSTECTOMY N/A 04/18/2015   Procedure: LAPAROSCOPIC CHOLECYSTECTOMY;  Surgeon: Ralene Ok, MD;  Location: [facility];  Service: General;  Laterality: N/A;  . HYSTEROSCOPY W/D&C  12/24/2001   Archie Endo 09/28/2010  . I&D EXTREMITY Right 07/25/2016   Procedure: IRRIGATION AND DEBRIDEMENT RIGHT LEG ULCER, APPLY VERAFLO VAC;  Surgeon: [name], MD;  Location: [facility];  Service: Orthopedics;  Laterality: Right;  . INCISE AND DRAIN ABCESS Right 07/14/2016  . INCISION AND DRAINAGE Right 09/10/2008   leg:  skin and soft tissue and muscle/notes 09/15/2010  . INCISION AND DRAINAGE Right 01/01/2008   Chronic venous stasis insufficiency ulcer,/notes 09/14/2010/  . INCISION AND DRAINAGE Right 0/1749   calf w/application wound vac/notes 07/20/2010  . INCISION AND DRAINAGE Right 07/25/2016   IRRIGATION AND DEBRIDEMENT RIGHT LEG ULCER,  . LAPAROSCOPIC GASTRIC BYPASS  ~ 2007  . SKIN GRAFT SPLIT THICKNESS LEG / FOOT Right 07/25/2016   LEG  . SKIN SPLIT GRAFT Right 07/27/2016   Procedure: SKIN GRAFT RIGHT LEG WITH THERASKIN APPLICATION;  Surgeon: [name], MD;  Location: [facility];  Service: Orthopedics;  Laterality: Right;  . TONSILLECTOMY     Social History   Occupational History  . Not on file  Tobacco Use  .  Smoking status: Never Smoker  . Smokeless tobacco: Never Used  Substance and Sexual Activity  . Alcohol use: No  . Drug use: No  . Sexual activity: Yes    Partners: Male    Birth control/protection: IUD

## 2017-06-19 ENCOUNTER — Ambulatory Visit: Payer: Medicare Other | Admitting: Cardiology

## 2017-06-19 ENCOUNTER — Telehealth: Payer: Self-pay | Admitting: *Deleted

## 2017-06-19 NOTE — Telephone Encounter (Signed)
-----   Message from Lenard Galloway V sent at 06/16/2017  3:49 PM EST ----- Regarding: Missed echocardiogram Just an FYI. This patient did not show for her echo appointment on 06/12/17. So there will be no echo results to go over on 06/19/17. We do not have any available appointments for her Monday. Please call patient and let her know if she still needs to come on Monday for her office visit.   Thanks  Lorriane Shire

## 2017-06-20 ENCOUNTER — Encounter: Payer: Self-pay | Admitting: Cardiology

## 2017-06-21 ENCOUNTER — Ambulatory Visit (INDEPENDENT_AMBULATORY_CARE_PROVIDER_SITE_OTHER): Payer: Medicare Other | Admitting: Orthopedic Surgery

## 2017-06-23 ENCOUNTER — Telehealth (INDEPENDENT_AMBULATORY_CARE_PROVIDER_SITE_OTHER): Payer: Self-pay | Admitting: Orthopedic Surgery

## 2017-06-23 NOTE — Telephone Encounter (Signed)
Patient called and requested that you give her a call. [phone] Thank you.

## 2017-06-27 ENCOUNTER — Ambulatory Visit (HOSPITAL_COMMUNITY): Payer: Medicare Other | Attending: Cardiology

## 2017-06-27 ENCOUNTER — Other Ambulatory Visit: Payer: Self-pay

## 2017-06-27 DIAGNOSIS — Z86711 Personal history of pulmonary embolism: Secondary | ICD-10-CM | POA: Diagnosis not present

## 2017-06-27 DIAGNOSIS — I130 Hypertensive heart and chronic kidney disease with heart failure and stage 1 through stage 4 chronic kidney disease, or unspecified chronic kidney disease: Secondary | ICD-10-CM | POA: Diagnosis not present

## 2017-06-27 DIAGNOSIS — I5023 Acute on chronic systolic (congestive) heart failure: Secondary | ICD-10-CM | POA: Diagnosis not present

## 2017-06-27 DIAGNOSIS — N189 Chronic kidney disease, unspecified: Secondary | ICD-10-CM | POA: Diagnosis not present

## 2017-06-27 DIAGNOSIS — E669 Obesity, unspecified: Secondary | ICD-10-CM | POA: Insufficient documentation

## 2017-06-27 DIAGNOSIS — I429 Cardiomyopathy, unspecified: Secondary | ICD-10-CM | POA: Insufficient documentation

## 2017-06-27 DIAGNOSIS — Z6841 Body Mass Index (BMI) 40.0 and over, adult: Secondary | ICD-10-CM | POA: Diagnosis not present

## 2017-06-27 DIAGNOSIS — G4733 Obstructive sleep apnea (adult) (pediatric): Secondary | ICD-10-CM | POA: Diagnosis not present

## 2017-06-28 ENCOUNTER — Encounter (INDEPENDENT_AMBULATORY_CARE_PROVIDER_SITE_OTHER): Payer: Self-pay | Admitting: Family

## 2017-06-28 ENCOUNTER — Ambulatory Visit (INDEPENDENT_AMBULATORY_CARE_PROVIDER_SITE_OTHER): Payer: Medicare Other | Admitting: Family

## 2017-06-28 VITALS — Ht 68.0 in | Wt 321.0 lb

## 2017-06-28 DIAGNOSIS — I87331 Chronic venous hypertension (idiopathic) with ulcer and inflammation of right lower extremity: Secondary | ICD-10-CM | POA: Diagnosis not present

## 2017-06-28 DIAGNOSIS — L97919 Non-pressure chronic ulcer of unspecified part of right lower leg with unspecified severity: Secondary | ICD-10-CM | POA: Diagnosis not present

## 2017-06-28 NOTE — Progress Notes (Signed)
Office Visit Note   Patient: Alice [name]           Date of Birth: January 11, 1971           MRN: [id number] Visit Date: 05/02/2017              Requested by: Benito Mccreedy, MD [address] [place], [place] [address] PCP: Benito Mccreedy, MD  Chief Complaint  Patient presents with  . Right Leg - Wound Check      HPI: Patient presents in follow-up for chronic venous ulcer right leg undergoing dynaflex compression wraps. she is having serial Dynaflex wrap with pieces of medical compression stockings against the wound.  Assessment & Plan: Visit Diagnoses:  1. 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 Will continue with current wound care, dynaflex and medical compression stocking pieces.  Follow-Up Instructions: Return in about 1 week (around 05/09/2017).   Ortho Exam  Patient is alert, oriented, no adenopathy, well-dressed, normal affect, normal respiratory effort. Examination patient's wound has good epithelialization around the wound edges. No depth. there is increased granulation tissue in the wound bed. scattered fibrinous tissue just about 5% of wound bed. Ulcer is stable in size. there is no drainage no cellulitis no odor no signs of infection.  Imaging: No results found. No images are attached to the encounter.  Labs: Lab Results  Component Value Date   REPTSTATUS 01/04/2017 FINAL 12/30/2016   REPTSTATUS 01/04/2017 FINAL 12/30/2016   GRAMSTAIN  05/17/2013    MODERATE WBC PRESENT, PREDOMINANTLY MONONUCLEAR MODERATE GRAM POSITIVE COCCI IN PAIRS IN CHAINS RARE GRAM NEGATIVE RODS Gram Stain Report Called to,Read Back By and Verified With: HOPPER,M RN @ [phone] 05/17/13 LEONARD,A   GRAMSTAIN  05/17/2013    MODERATE WBC PRESENT, PREDOMINANTLY MONONUCLEAR MODERATE SQUAMOUS EPITHELIAL CELLS PRESENT MODERATE GRAM POSITIVE COCCI IN PAIRS IN CHAINS RARE GRAM NEGATIVE  RODS Performed at [facility]   CULT NO GROWTH 5 DAYS 12/30/2016   CULT NO GROWTH 5 DAYS 12/30/2016   LABORGA ESCHERICHIA COLI (A) 12/29/2016    @LABSALLVALUES (HGBA1)@  Body mass index is 48.81 kg/m.  Orders:  No orders of the defined types were placed in this encounter.  No orders of the defined types were placed in this encounter.    Procedures: No procedures performed  Clinical Data: No additional findings.  ROS:  All other systems negative, except as noted in the HPI. Review of Systems  Objective: Vital Signs: Ht 5\' 8"  (1.727 m)   Wt (!) 321 lb (145.6 kg)   BMI 48.81 kg/m   Specialty Comments:  No specialty comments available.  PMFS History: Patient Active Problem List   Diagnosis Date Noted  . Acute lower UTI 12/29/2016  . Sepsis ([place]) 12/29/2016  . Acute kidney injury superimposed on CKD ([place]) 12/28/2016  . Atypical chest pain 12/28/2016  . Hx of skin graft 08/01/2016  . 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 ([place]) 07/05/2016  . Trichomonal infection 11/24/2015  . Abdominal pain 05/02/2015  . Symptomatic cholelithiasis 04/16/2015  . Acute bilateral upper abdominal pain 04/16/2015  . Microcytic anemia 05/18/2013  . Hypotension, unspecified 05/17/2013  . CKD (chronic kidney disease), stage III ([place]) 05/17/2013  . Hypokalemia 05/17/2013  . Anal fissure 02/06/2013  . Anal skin tag 02/06/2013  . ALLERGIC  RHINITIS 03/05/2010  . LOW BACK PAIN SYNDROME 12/13/2007  . WOUND INFECTION 10/10/2007  . OBESITY, MORBID 12/02/2006  . HTN (hypertension) 12/02/2006  . History of pulmonary embolism 12/02/2006  . History of DVT (deep vein thrombosis) 12/02/2006  . 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 GASTROESOPHAGEAL REFLUX DISEASE 12/02/2006  . OSA (obstructive sleep apnea) 12/02/2006   Past Medical History:   Diagnosis Date  . Anemia   . Anxiety   . Chronic bronchitis ([place])   . Chronic upper back pain   . Depression   . DVT (deep venous thrombosis) (HCC)    BLE  . GERD (gastroesophageal reflux disease)   . Headache    "weekly" (04/16/2015)  . Hypertension   . Migraine    "monthly" (04/16/2015)  . Obesity   . Pneumonia 2014  . Pulmonary embolism ([place])   . Sinusitis nasal   . Sleep apnea    "I'm suppose to wear a mask but I don't" (04/16/2015)  . Varicose veins of right lower extremity   . Venous stasis of lower extremity    right    Family History  Problem Relation Age of Onset  . Kidney disease Mother        kidney transplant    Past Surgical History:  Procedure Laterality Date  . CHOLECYSTECTOMY N/A 04/18/2015   Procedure: LAPAROSCOPIC CHOLECYSTECTOMY;  Surgeon: Ralene Ok, MD;  Location: [facility];  Service: General;  Laterality: N/A;  . HYSTEROSCOPY W/D&C  12/24/2001   Archie Endo 09/28/2010  . I&D EXTREMITY Right 07/25/2016   Procedure: IRRIGATION AND DEBRIDEMENT RIGHT LEG ULCER, APPLY VERAFLO VAC;  Surgeon: [name], MD;  Location: [facility];  Service: Orthopedics;  Laterality: Right;  . INCISE AND DRAIN ABCESS Right 07/14/2016  . INCISION AND DRAINAGE Right 09/10/2008   leg:  skin and soft tissue and muscle/notes 09/15/2010  . INCISION AND DRAINAGE Right 01/01/2008   Chronic venous stasis insufficiency ulcer,/notes 09/14/2010/  . INCISION AND DRAINAGE Right 05/5828   calf w/application wound vac/notes 07/20/2010  . INCISION AND DRAINAGE Right 07/25/2016   IRRIGATION AND DEBRIDEMENT RIGHT LEG ULCER,  . LAPAROSCOPIC GASTRIC BYPASS  ~ 2007  . SKIN GRAFT SPLIT THICKNESS LEG / FOOT Right 07/25/2016   LEG  . SKIN SPLIT GRAFT Right 07/27/2016   Procedure: SKIN GRAFT RIGHT LEG WITH THERASKIN APPLICATION;  Surgeon: [name], MD;  Location: [facility];  Service: Orthopedics;  Laterality: Right;  . TONSILLECTOMY     Social History   Occupational History  . Not on file  Tobacco Use  .  Smoking status: Never Smoker  . Smokeless tobacco: Never Used  Substance and Sexual Activity  . Alcohol use: No  . Drug use: No  . Sexual activity: Yes    Partners: Male    Birth control/protection: IUD

## 2017-07-10 ENCOUNTER — Ambulatory Visit (INDEPENDENT_AMBULATORY_CARE_PROVIDER_SITE_OTHER): Payer: Medicare Other | Admitting: Orthopedic Surgery

## 2017-07-19 ENCOUNTER — Telehealth (INDEPENDENT_AMBULATORY_CARE_PROVIDER_SITE_OTHER): Payer: Self-pay | Admitting: Orthopedic Surgery

## 2017-07-19 NOTE — Telephone Encounter (Signed)
Patient called really wanting to speak with you, wasn't very specific about what it was in regards to. I tried to make her an appointment for next Wednesday but she said you would want to see her tomorrow? [phone] # [phone]

## 2017-07-19 NOTE — Telephone Encounter (Signed)
Can you please call and  have someone open a spot for tomorrow afternoon at 4 pm please? We can work her in. thanks

## 2017-07-20 ENCOUNTER — Encounter (INDEPENDENT_AMBULATORY_CARE_PROVIDER_SITE_OTHER): Payer: Self-pay

## 2017-07-20 ENCOUNTER — Ambulatory Visit (INDEPENDENT_AMBULATORY_CARE_PROVIDER_SITE_OTHER): Payer: Medicare Other | Admitting: Orthopedic Surgery

## 2017-08-03 DIAGNOSIS — Z7901 Long term (current) use of anticoagulants: Secondary | ICD-10-CM | POA: Diagnosis not present

## 2017-08-03 DIAGNOSIS — H538 Other visual disturbances: Secondary | ICD-10-CM | POA: Diagnosis not present

## 2017-08-03 DIAGNOSIS — E559 Vitamin D deficiency, unspecified: Secondary | ICD-10-CM | POA: Diagnosis not present

## 2017-08-03 DIAGNOSIS — R1013 Epigastric pain: Secondary | ICD-10-CM | POA: Diagnosis not present

## 2017-08-03 DIAGNOSIS — Z01118 Encounter for examination of ears and hearing with other abnormal findings: Secondary | ICD-10-CM | POA: Diagnosis not present

## 2017-08-03 DIAGNOSIS — I10 Essential (primary) hypertension: Secondary | ICD-10-CM | POA: Diagnosis not present

## 2017-08-03 DIAGNOSIS — G629 Polyneuropathy, unspecified: Secondary | ICD-10-CM | POA: Diagnosis not present

## 2017-08-03 DIAGNOSIS — R7303 Prediabetes: Secondary | ICD-10-CM | POA: Diagnosis not present

## 2017-08-03 DIAGNOSIS — G4733 Obstructive sleep apnea (adult) (pediatric): Secondary | ICD-10-CM | POA: Diagnosis not present

## 2017-08-03 DIAGNOSIS — F329 Major depressive disorder, single episode, unspecified: Secondary | ICD-10-CM | POA: Diagnosis not present

## 2017-08-23 DIAGNOSIS — I10 Essential (primary) hypertension: Secondary | ICD-10-CM | POA: Diagnosis not present

## 2017-08-23 DIAGNOSIS — Z6841 Body Mass Index (BMI) 40.0 and over, adult: Secondary | ICD-10-CM | POA: Diagnosis not present

## 2017-08-23 DIAGNOSIS — Z9884 Bariatric surgery status: Secondary | ICD-10-CM | POA: Diagnosis not present

## 2017-08-24 ENCOUNTER — Ambulatory Visit (INDEPENDENT_AMBULATORY_CARE_PROVIDER_SITE_OTHER): Payer: Medicare Other | Admitting: Orthopedic Surgery

## 2017-08-24 ENCOUNTER — Encounter (INDEPENDENT_AMBULATORY_CARE_PROVIDER_SITE_OTHER): Payer: Self-pay | Admitting: Orthopedic Surgery

## 2017-08-24 VITALS — Ht 68.0 in | Wt 321.0 lb

## 2017-08-24 DIAGNOSIS — E43 Unspecified severe protein-calorie malnutrition: Secondary | ICD-10-CM

## 2017-08-24 DIAGNOSIS — L97919 Non-pressure chronic ulcer of unspecified part of right lower leg with unspecified severity: Secondary | ICD-10-CM | POA: Diagnosis not present

## 2017-08-24 DIAGNOSIS — I87331 Chronic venous hypertension (idiopathic) with ulcer and inflammation of right lower extremity: Secondary | ICD-10-CM

## 2017-08-24 NOTE — Progress Notes (Signed)
Office Visit Note   Patient: Alice [name]           Date of Birth: 08-04-1970           MRN: [id number] Visit Date: 08/24/2017              Requested by: Benito Mccreedy, MD [place] [address] [place], [place] [address] PCP: Benito Mccreedy, MD  Chief Complaint  Patient presents with  . Right Leg - Follow-up    RLE VSi ulceration calf. S/p I&D STSG 07/2016      HPI: Patient presents in follow-up for chronic venous ulcer right lower extremity status post split-thickness skin graft.  Patient states she has episodes of the wound healing and that it regresses healing and regressing.  Her blood work was reviewed which shows an albumin of 2.2 with severe protein malnutrition.  Assessment & Plan: Visit Diagnoses:  1. 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  Also discussed that with gastric bypass surgery her nutritional intake will be even further compromised.  Follow-Up Instructions: Return in about 1 week (around 08/31/2017).   Ortho Exam  Patient is alert, oriented, no adenopathy, well-dressed, normal affect, normal respiratory effort. Examination the wound bed has healthy granulation tissue there is good epithelialization around the wound edges there is no dermatitis there is decreased swelling there is brawny skin color changes.  There are a few staples remaining in the wound bed from the skin graft.  There is no signs of infection.  Imaging: No results found. No images are attached to the encounter.  Labs: Lab Results  Component Value Date   REPTSTATUS 01/04/2017 FINAL 12/30/2016   REPTSTATUS 01/04/2017 FINAL 12/30/2016   GRAMSTAIN  05/17/2013    MODERATE WBC PRESENT, PREDOMINANTLY MONONUCLEAR MODERATE GRAM POSITIVE COCCI IN PAIRS IN CHAINS RARE GRAM NEGATIVE  RODS Gram Stain Report Called to,Read Back By and Verified With: HOPPER,M RN @ [phone] 05/17/13 LEONARD,A   GRAMSTAIN  05/17/2013    MODERATE WBC PRESENT, PREDOMINANTLY MONONUCLEAR MODERATE SQUAMOUS EPITHELIAL CELLS PRESENT MODERATE GRAM POSITIVE COCCI IN PAIRS IN CHAINS RARE GRAM NEGATIVE RODS Performed at [facility]   CULT NO GROWTH 5 DAYS 12/30/2016   CULT NO GROWTH 5 DAYS 12/30/2016   LABORGA ESCHERICHIA COLI (A) 12/29/2016    @LABSALLVALUES (HGBA1)@  Body mass index is 48.81 kg/m.  Orders:  No orders of the defined types were placed in this encounter.  No orders of the defined types were placed in this encounter.    Procedures: No procedures performed  Clinical Data: No additional findings.  ROS:  All other systems negative, except as noted in the HPI. Review of Systems  Objective: Vital Signs: Ht 5\' 8"  (1.727 m)   Wt (!) 321 lb (145.6 kg)   BMI 48.81 kg/m   Specialty Comments:  No specialty comments available.  PMFS History: Patient Active Problem List   Diagnosis Date Noted  . Acute lower UTI 12/29/2016  . Sepsis ([place]) 12/29/2016  . Acute kidney injury superimposed on CKD ([place]) 12/28/2016  . Atypical chest pain 12/28/2016  . Hx of skin graft 08/01/2016  . 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 ([place]) 07/25/2016  . 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) 07/05/2016  . Trichomonal infection 11/24/2015  .  Abdominal pain 05/02/2015  . Symptomatic cholelithiasis 04/16/2015  . Acute bilateral upper abdominal pain 04/16/2015  . Microcytic anemia 05/18/2013  . Hypotension, unspecified 05/17/2013  . CKD (chronic kidney disease), stage III ([place]) 05/17/2013  . Hypokalemia 05/17/2013  . Anal fissure 02/06/2013  . Anal skin tag 02/06/2013  . ALLERGIC RHINITIS 03/05/2010  . LOW BACK PAIN SYNDROME 12/13/2007  . WOUND INFECTION 10/10/2007  . OBESITY, MORBID 12/02/2006  . HTN  (hypertension) 12/02/2006  . History of pulmonary embolism 12/02/2006  . History of DVT (deep vein thrombosis) 12/02/2006  . 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 GASTROESOPHAGEAL REFLUX DISEASE 12/02/2006  . OSA (obstructive sleep apnea) 12/02/2006   Past Medical History:  Diagnosis Date  . Anemia   . Anxiety   . Chronic bronchitis ([place])   . Chronic upper back pain   . Depression   . DVT (deep venous thrombosis) (HCC)    BLE  . GERD (gastroesophageal reflux disease)   . Headache    "weekly" (04/16/2015)  . Hypertension   . Migraine    "monthly" (04/16/2015)  . Obesity   . Pneumonia 2014  . Pulmonary embolism ([place])   . Sinusitis nasal   . Sleep apnea    "I'm suppose to wear a mask but I don't" (04/16/2015)  . Varicose veins of right lower extremity   . Venous stasis of lower extremity    right    Family History  Problem Relation Age of Onset  . Kidney disease Mother        kidney transplant    Past Surgical History:  Procedure Laterality Date  . CHOLECYSTECTOMY N/A 04/18/2015   Procedure: LAPAROSCOPIC CHOLECYSTECTOMY;  Surgeon: Ralene Ok, MD;  Location: [place];  Service: General;  Laterality: N/A;  . HYSTEROSCOPY W/D&C  12/24/2001   Archie Endo 09/28/2010  . I&D EXTREMITY Right 07/25/2016   Procedure: IRRIGATION AND DEBRIDEMENT RIGHT LEG ULCER, APPLY VERAFLO VAC;  Surgeon: [name], MD;  Location: [facility];  Service: Orthopedics;  Laterality: Right;  . INCISE AND DRAIN ABCESS Right 07/14/2016  . INCISION AND DRAINAGE Right 09/10/2008   leg:  skin and soft tissue and muscle/notes 09/15/2010  . INCISION AND DRAINAGE Right 01/01/2008   Chronic venous stasis insufficiency ulcer,/notes 09/14/2010/  . INCISION AND DRAINAGE Right 10/7207   calf w/application wound vac/notes 07/20/2010  . INCISION AND DRAINAGE Right 07/25/2016   IRRIGATION AND DEBRIDEMENT RIGHT LEG ULCER,  . LAPAROSCOPIC GASTRIC BYPASS  ~ 2007  . SKIN GRAFT SPLIT THICKNESS LEG / FOOT Right 07/25/2016     LEG  . SKIN SPLIT GRAFT Right 07/27/2016   Procedure: SKIN GRAFT RIGHT LEG WITH THERASKIN APPLICATION;  Surgeon: [name], MD;  Location: [place];  Service: Orthopedics;  Laterality: Right;  . TONSILLECTOMY     Social History   Occupational History  . Not on file  Tobacco Use  . Smoking status: Never Smoker  . Smokeless tobacco: Never Used  Substance and Sexual Activity  . Alcohol use: No  . Drug use: No  . Sexual activity: Yes    Partners: Male    Birth control/protection: IUD

## 2017-08-31 ENCOUNTER — Ambulatory Visit (INDEPENDENT_AMBULATORY_CARE_PROVIDER_SITE_OTHER): Payer: Medicare Other | Admitting: Orthopedic Surgery

## 2017-09-05 ENCOUNTER — Other Ambulatory Visit: Payer: Self-pay

## 2017-09-05 ENCOUNTER — Emergency Department (HOSPITAL_COMMUNITY)
Admission: EM | Admit: 2017-09-05 | Discharge: 2017-09-06 | Disposition: A | Discharge status: Home / Self Care | Payer: Medicare Other | Attending: Emergency Medicine | Admitting: Emergency Medicine

## 2017-09-05 DIAGNOSIS — M79605 Pain in left leg: Secondary | ICD-10-CM | POA: Diagnosis not present

## 2017-09-05 DIAGNOSIS — Z5321 Procedure and treatment not carried out due to patient leaving prior to being seen by health care provider: Secondary | ICD-10-CM | POA: Diagnosis not present

## 2017-09-05 LAB — URINALYSIS, ROUTINE W REFLEX MICROSCOPIC
Bilirubin Urine: NEGATIVE
GLUCOSE, UA: NEGATIVE mg/dL
Ketones, ur: NEGATIVE mg/dL
Nitrite: NEGATIVE
PH: 6 (ref 5.0–8.0)
Protein, ur: NEGATIVE mg/dL
RBC / HPF: >50 RBC/hpf — ABNORMAL HIGH (ref 0–5)
Specific Gravity, Urine: 1.011 (ref 1.005–1.030)

## 2017-09-05 LAB — CBC
HCT: 30.1 % — ABNORMAL LOW (ref 36.0–46.0)
Hemoglobin: 9.5 g/dL — ABNORMAL LOW (ref 12.0–15.0)
MCH: 23.6 pg — AB (ref 26.0–34.0)
MCHC: 31.6 g/dL (ref 30.0–36.0)
MCV: 74.9 fL — AB (ref 78.0–100.0)
PLATELETS: 337 10*3/uL (ref 150–400)
RBC: 4.02 MIL/uL (ref 3.87–5.11)
RDW: 16.6 % — ABNORMAL HIGH (ref 11.5–15.5)
WBC: 7.7 10*3/uL (ref 4.0–10.5)

## 2017-09-05 LAB — BASIC METABOLIC PANEL
Anion gap: 9 (ref 5–15)
BUN: 22 mg/dL — AB (ref 6–20)
CALCIUM: 8.5 mg/dL — AB (ref 8.9–10.3)
CHLORIDE: 104 mmol/L (ref 101–111)
CO2: 24 mmol/L (ref 22–32)
Creatinine, Ser: 1.33 mg/dL — ABNORMAL HIGH (ref 0.44–1.00)
GFR calc Af Amer: 55 mL/min — ABNORMAL LOW (ref >60)
GFR calc non Af Amer: 47 mL/min — ABNORMAL LOW (ref >60)
Glucose, Bld: 79 mg/dL (ref 65–99)
Potassium: 3.8 mmol/L (ref 3.5–5.1)
SODIUM: 137 mmol/L (ref 135–145)

## 2017-09-05 LAB — I-STAT BETA HCG BLOOD, ED (MC, WL, AP ONLY): I-stat hCG, quantitative: <5 m[IU]/mL (ref <5)

## 2017-09-05 NOTE — ED Triage Notes (Signed)
Pt reports left leg pain that goes from the middle of her thigh up to her hip and now to lower back. Pt reports pain when she tries to stand however was ambulatory in Triage.  Good pulses/temp and appearance.

## 2017-09-06 ENCOUNTER — Ambulatory Visit (INDEPENDENT_AMBULATORY_CARE_PROVIDER_SITE_OTHER): Payer: Medicare Other

## 2017-09-06 ENCOUNTER — Encounter (INDEPENDENT_AMBULATORY_CARE_PROVIDER_SITE_OTHER): Payer: Self-pay

## 2017-09-06 ENCOUNTER — Ambulatory Visit (INDEPENDENT_AMBULATORY_CARE_PROVIDER_SITE_OTHER): Payer: Medicare Other | Admitting: Orthopedic Surgery

## 2017-09-06 NOTE — ED Notes (Signed)
Pt came to the desk asking how much longer she would need to wait then stated she was going to leave.  I made her aware of the longest wait time.  She then repeated that she was going to leave without being seen.

## 2017-09-07 ENCOUNTER — Ambulatory Visit (INDEPENDENT_AMBULATORY_CARE_PROVIDER_SITE_OTHER): Payer: Medicare Other | Admitting: Orthopedic Surgery

## 2017-09-07 ENCOUNTER — Ambulatory Visit (INDEPENDENT_AMBULATORY_CARE_PROVIDER_SITE_OTHER): Payer: Medicare Other

## 2017-09-07 ENCOUNTER — Encounter (INDEPENDENT_AMBULATORY_CARE_PROVIDER_SITE_OTHER): Payer: Self-pay | Admitting: Orthopedic Surgery

## 2017-09-07 VITALS — Ht 66.0 in | Wt 330.0 lb

## 2017-09-07 DIAGNOSIS — M5442 Lumbago with sciatica, left side: Secondary | ICD-10-CM | POA: Diagnosis not present

## 2017-09-07 MED ORDER — PREDNISONE 10 MG PO TABS: 20.0000 mg | ORAL_TABLET | Freq: Every day | ORAL | 0 refills | Status: DC

## 2017-09-07 NOTE — Progress Notes (Signed)
Office Visit Note   Patient: Alice [name]           Date of Birth: 1970/12/23           MRN: [id number] Visit Date: 09/07/2017              Requested by: Benito Mccreedy, MD [place] [place], [place] [address] PCP: Benito Mccreedy, MD  Chief Complaint  Patient presents with  . Lower Back - Pain      HPI: Patient is a 47 year old woman who presents with lower back pain with radicular symptoms down the left buttocks down the lateral aspect of the left thigh.  Patient sits leaning to the right side to offload pressure.  She states she did try to go to emergency room but was not seen.  Patient states that she has started exercising at [employer].  Patient denies history of diabetes.  Assessment & Plan: Visit Diagnoses:  1. Acute left-sided low back pain with left-sided sciatica     Plan: Patient is call the prescription of prednisone to take 20 mg with breakfast daily.  Continue with her exercise program recommended heat on her back.  Discussed that a follow-up if she is still symptomatic we would try to get an MRI scan with the possibility of epidural injections.  Follow-Up Instructions: Return in about 1 month (around 10/05/2017).   Ortho Exam  Patient is alert, oriented, no adenopathy, well-dressed, normal affect, normal respiratory effort. Examination patient has an antalgic gait she sits leaning to the right side she has a positive straight leg raise on the left there is no focal motor weakness.  BMI greater than 50.  Imaging: Xr Lumbar Spine 2-3 Views  Result Date: 09/07/2017 2 view radiographs of the lumbar spine shows very mild scoliosis she does have degenerative disc disease throughout the lumbar spine with osteophytic bone spurs anteriorly joint space narrowing.  No images are attached to the encounter.  Labs: Lab Results  Component Value Date   REPTSTATUS 01/04/2017 FINAL 12/30/2016   REPTSTATUS 01/04/2017 FINAL 12/30/2016   GRAMSTAIN   05/17/2013    MODERATE WBC PRESENT, PREDOMINANTLY MONONUCLEAR MODERATE GRAM POSITIVE COCCI IN PAIRS IN CHAINS RARE GRAM NEGATIVE RODS Gram Stain Report Called to,Read Back By and Verified With: HOPPER,M RN @ [phone] 05/17/13 LEONARD,A   GRAMSTAIN  05/17/2013    MODERATE WBC PRESENT, PREDOMINANTLY MONONUCLEAR MODERATE SQUAMOUS EPITHELIAL CELLS PRESENT MODERATE GRAM POSITIVE COCCI IN PAIRS IN CHAINS RARE GRAM NEGATIVE RODS Performed at [facility]   CULT NO GROWTH 5 DAYS 12/30/2016   CULT NO GROWTH 5 DAYS 12/30/2016   LABORGA ESCHERICHIA COLI (A) 12/29/2016    @LABSALLVALUES (HGBA1)@  Body mass index is 53.26 kg/m.  Orders:  Orders Placed This Encounter  Procedures  . XR Lumbar Spine 2-3 Views   Meds ordered this encounter  Medications  . predniSONE (DELTASONE) 10 MG tablet    Sig: Take 2 tablets (20 mg total) by mouth daily with breakfast.    Dispense:  60 tablet    Refill:  0     Procedures: No procedures performed  Clinical Data: No additional findings.  ROS:  All other systems negative, except as noted in the HPI. Review of Systems  Objective: Vital Signs: Ht 5\' 6"  (1.676 m)   Wt (!) 330 lb (149.7 kg)   BMI 53.26 kg/m   Specialty Comments:  No specialty comments available.  PMFS History: Patient Active Problem List   Diagnosis Date Noted  .  Severe malnutrition ([place]) 08/24/2017  . Acute lower UTI 12/29/2016  . Sepsis ([place]) 12/29/2016  . Acute kidney injury superimposed on CKD ([place]) 12/28/2016  . Atypical chest pain 12/28/2016  . Hx of skin graft 08/01/2016  . 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 ([place]) 07/25/2016  . 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 Trichomonal infection 11/24/2015  . Abdominal pain 05/02/2015  . Symptomatic cholelithiasis 04/16/2015  . Acute bilateral upper abdominal pain 04/16/2015  . Microcytic anemia 05/18/2013  . Hypotension,  unspecified 05/17/2013  . CKD (chronic kidney disease), stage III ([place]) 05/17/2013  . Hypokalemia 05/17/2013  . Anal fissure 02/06/2013  . Anal skin tag 02/06/2013  . ALLERGIC RHINITIS 03/05/2010  . LOW BACK PAIN SYNDROME 12/13/2007  . WOUND INFECTION 10/10/2007  . OBESITY, MORBID 12/02/2006  . HTN (hypertension) 12/02/2006  . History of pulmonary embolism 12/02/2006  . History of DVT (deep vein thrombosis) 12/02/2006  . 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 GASTROESOPHAGEAL REFLUX DISEASE 12/02/2006  . OSA (obstructive sleep apnea) 12/02/2006   Past Medical History:  Diagnosis Date  . Anemia   . Anxiety   . Chronic bronchitis ([place])   . Chronic upper back pain   . Depression   . DVT (deep venous thrombosis) (HCC)    BLE  . GERD (gastroesophageal reflux disease)   . Headache    "weekly" (04/16/2015)  . Hypertension   . Migraine    "monthly" (04/16/2015)  . Obesity   . Pneumonia 2014  . Pulmonary embolism ([place])   . Sinusitis nasal   . Sleep apnea    "I'm suppose to wear a mask but I don't" (04/16/2015)  . Varicose veins of right lower extremity   . Venous stasis of lower extremity    right    Family History  Problem Relation Age of Onset  . Kidney disease Mother        kidney transplant    Past Surgical History:  Procedure Laterality Date  . CHOLECYSTECTOMY N/A 04/18/2015   Procedure: LAPAROSCOPIC CHOLECYSTECTOMY;  Surgeon: Ralene Ok, MD;  Location: [facility];  Service: General;  Laterality: N/A;  . HYSTEROSCOPY W/D&C  12/24/2001   Archie Endo 09/28/2010  . I&D EXTREMITY Right 07/25/2016   Procedure: IRRIGATION AND DEBRIDEMENT RIGHT LEG ULCER, APPLY VERAFLO VAC;  Surgeon: [name], MD;  Location: [facility] [place];  Service: Orthopedics;  Laterality: Right;  . INCISE AND DRAIN ABCESS Right 07/14/2016  . INCISION AND DRAINAGE Right 09/10/2008   leg:  skin and soft tissue and muscle/notes 09/15/2010  . INCISION AND DRAINAGE Right 01/01/2008   Chronic venous stasis  insufficiency ulcer,/notes 09/14/2010/  . INCISION AND DRAINAGE Right 01/239   calf w/application wound vac/notes 07/20/2010  . INCISION AND DRAINAGE Right 07/25/2016   IRRIGATION AND DEBRIDEMENT RIGHT LEG ULCER,  . LAPAROSCOPIC GASTRIC BYPASS  ~ 2007  . SKIN GRAFT SPLIT THICKNESS LEG / FOOT Right 07/25/2016   LEG  . SKIN SPLIT GRAFT Right 07/27/2016   Procedure: SKIN GRAFT RIGHT LEG WITH THERASKIN APPLICATION;  Surgeon: [name], MD;  Location: [facility];  Service: Orthopedics;  Laterality: Right;  . TONSILLECTOMY     Social History   Occupational History  . Not on file  Tobacco Use  . Smoking status: Never Smoker  . Smokeless tobacco: Never Used  Substance and Sexual Activity  . Alcohol use: No  . Drug use: No  . Sexual activity: Yes  Partners: Male    Birth control/protection: IUD

## 2017-09-27 ENCOUNTER — Ambulatory Visit (INDEPENDENT_AMBULATORY_CARE_PROVIDER_SITE_OTHER): Payer: Medicare Other | Admitting: Orthopedic Surgery

## 2017-09-27 ENCOUNTER — Encounter (INDEPENDENT_AMBULATORY_CARE_PROVIDER_SITE_OTHER): Payer: Self-pay | Admitting: Orthopedic Surgery

## 2017-09-27 DIAGNOSIS — I87331 Chronic venous hypertension (idiopathic) with ulcer and inflammation of right lower extremity: Secondary | ICD-10-CM

## 2017-09-27 DIAGNOSIS — L97919 Non-pressure chronic ulcer of unspecified part of right lower leg with unspecified severity: Secondary | ICD-10-CM | POA: Diagnosis not present

## 2017-09-27 MED ORDER — TRAMADOL HCL 50 MG PO TABS: 50.0000 mg | ORAL_TABLET | Freq: Four times a day (QID) | ORAL | 0 refills | Status: DC | PRN

## 2017-09-27 NOTE — Progress Notes (Signed)
Office Visit Note   Patient: Alice [name]           Date of Birth: 1971-02-23           MRN: [id number] Visit Date: 09/27/2017              Requested by: Benito Mccreedy, MD [place] [address] [place], [place] [address] PCP: Benito Mccreedy, MD  Chief Complaint  Patient presents with  . Left Leg - Pain, Follow-up      HPI: Patient is a 47 year old woman presents in follow-up for venous insufficiency right lower extremity with massive wound status post split-thickness skin graft.  Assessment & Plan: Visit Diagnoses:  1. 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  Scription for tramadol.  Follow-Up Instructions: Return in about 1 week (around 10/04/2017).   Ortho Exam  Patient is alert, oriented, no adenopathy, well-dressed, normal affect, normal respiratory effort. Examination patient's wound bed has excellent granulation tissue the wound edges are flat possibly 75% healthy granulation tissue no wound edges.  No cellulitis no odor no drainage.  Imaging: No results found. No images are attached to the encounter.  Labs: Lab Results  Component Value Date   REPTSTATUS 01/04/2017 FINAL 12/30/2016   REPTSTATUS 01/04/2017 FINAL 12/30/2016   GRAMSTAIN  05/17/2013    MODERATE WBC PRESENT, PREDOMINANTLY MONONUCLEAR MODERATE GRAM POSITIVE COCCI IN PAIRS IN CHAINS RARE GRAM NEGATIVE RODS Gram Stain Report Called to,Read Back By and Verified With: HOPPER,M RN @ [phone] 05/17/13 LEONARD,A   GRAMSTAIN  05/17/2013    MODERATE WBC PRESENT, PREDOMINANTLY MONONUCLEAR MODERATE SQUAMOUS EPITHELIAL CELLS PRESENT MODERATE GRAM POSITIVE COCCI IN PAIRS IN CHAINS RARE GRAM NEGATIVE RODS Performed at [facility]   CULT NO GROWTH 5 DAYS 12/30/2016   CULT NO GROWTH 5 DAYS 12/30/2016   LABORGA ESCHERICHIA COLI (A) 12/29/2016      Lab Results  Component Value Date   ALBUMIN 2.2 (L) 01/03/2017   ALBUMIN 2.1 (L) 01/02/2017   ALBUMIN 3.3 (L) 07/13/2015    There is no height or weight on file to calculate BMI.  Orders:  No orders of the defined types were placed in this encounter.  No orders of the defined types were placed in this encounter.    Procedures: No procedures performed  Clinical Data: No additional findings.  ROS:  All other systems negative, except as noted in the HPI. Review of Systems  Objective: Vital Signs: There were no vitals taken for this visit.  Specialty Comments:  No specialty comments available.  PMFS History: Patient Active Problem List   Diagnosis Date Noted  . Severe malnutrition ([place]) 08/24/2017  . Acute lower UTI 12/29/2016  . Sepsis ([place]) 12/29/2016  . Acute kidney injury superimposed on CKD ([place]) 12/28/2016  . Atypical chest pain 12/28/2016  . Hx of skin graft 08/01/2016  . 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 ([place]) 07/25/2016  . 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 Trichomonal infection 11/24/2015  . Abdominal pain 05/02/2015  . Symptomatic cholelithiasis 04/16/2015  . Acute bilateral upper abdominal pain 04/16/2015  . Microcytic anemia 05/18/2013  . Hypotension, unspecified 05/17/2013  . CKD (chronic kidney disease), stage III ([place]) 05/17/2013  . Hypokalemia 05/17/2013  . Anal fissure 02/06/2013  . Anal skin tag 02/06/2013  .  ALLERGIC RHINITIS 03/05/2010  . LOW BACK PAIN SYNDROME 12/13/2007  . WOUND INFECTION 10/10/2007  . OBESITY, MORBID 12/02/2006  . HTN (hypertension) 12/02/2006  . History of pulmonary embolism 12/02/2006  . History of DVT (deep vein thrombosis) 12/02/2006  . 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 GASTROESOPHAGEAL REFLUX DISEASE 12/02/2006  . OSA (obstructive sleep apnea) 12/02/2006   Past Medical History:   Diagnosis Date  . Anemia   . Anxiety   . Chronic bronchitis ([place])   . Chronic upper back pain   . Depression   . DVT (deep venous thrombosis) (HCC)    BLE  . GERD (gastroesophageal reflux disease)   . Headache    "weekly" (04/16/2015)  . Hypertension   . Migraine    "monthly" (04/16/2015)  . Obesity   . Pneumonia 2014  . Pulmonary embolism ([place])   . Sinusitis nasal   . Sleep apnea    "I'm suppose to wear a mask but I don't" (04/16/2015)  . Varicose veins of right lower extremity   . Venous stasis of lower extremity    right    Family History  Problem Relation Age of Onset  . Kidney disease Mother        kidney transplant    Past Surgical History:  Procedure Laterality Date  . CHOLECYSTECTOMY N/A 04/18/2015   Procedure: LAPAROSCOPIC CHOLECYSTECTOMY;  Surgeon: Ralene Ok, MD;  Location: [facility];  Service: General;  Laterality: N/A;  . HYSTEROSCOPY W/D&C  12/24/2001   Archie Endo 09/28/2010  . I&D EXTREMITY Right 07/25/2016   Procedure: IRRIGATION AND DEBRIDEMENT RIGHT LEG ULCER, APPLY VERAFLO VAC;  Surgeon: [name], MD;  Location: [facility];  Service: Orthopedics;  Laterality: Right;  . INCISE AND DRAIN ABCESS Right 07/14/2016  . INCISION AND DRAINAGE Right 09/10/2008   leg:  skin and soft tissue and muscle/notes 09/15/2010  . INCISION AND DRAINAGE Right 01/01/2008   Chronic venous stasis insufficiency ulcer,/notes 09/14/2010/  . INCISION AND DRAINAGE Right 07/3005   calf w/application wound vac/notes 07/20/2010  . INCISION AND DRAINAGE Right 07/25/2016   IRRIGATION AND DEBRIDEMENT RIGHT LEG ULCER,  . LAPAROSCOPIC GASTRIC BYPASS  ~ 2007  . SKIN GRAFT SPLIT THICKNESS LEG / FOOT Right 07/25/2016   LEG  . SKIN SPLIT GRAFT Right 07/27/2016   Procedure: SKIN GRAFT RIGHT LEG WITH THERASKIN APPLICATION;  Surgeon: [name], MD;  Location: [facility];  Service: Orthopedics;  Laterality: Right;  . TONSILLECTOMY     Social History   Occupational History  . Not on file  Tobacco Use  .  Smoking status: Never Smoker  . Smokeless tobacco: Never Used  Substance and Sexual Activity  . Alcohol use: No  . Drug use: No  . Sexual activity: Yes    Partners: Male    Birth control/protection: IUD

## 2017-10-04 ENCOUNTER — Ambulatory Visit (INDEPENDENT_AMBULATORY_CARE_PROVIDER_SITE_OTHER): Payer: Medicare Other | Admitting: Family

## 2017-10-10 ENCOUNTER — Ambulatory Visit (INDEPENDENT_AMBULATORY_CARE_PROVIDER_SITE_OTHER): Payer: Medicare Other

## 2017-10-10 ENCOUNTER — Encounter (INDEPENDENT_AMBULATORY_CARE_PROVIDER_SITE_OTHER): Payer: Self-pay | Admitting: Orthopedic Surgery

## 2017-10-10 ENCOUNTER — Ambulatory Visit (INDEPENDENT_AMBULATORY_CARE_PROVIDER_SITE_OTHER): Payer: Medicare Other | Admitting: Orthopedic Surgery

## 2017-10-10 VITALS — Ht 66.0 in | Wt 330.0 lb

## 2017-10-10 DIAGNOSIS — G8929 Other chronic pain: Secondary | ICD-10-CM | POA: Diagnosis not present

## 2017-10-10 DIAGNOSIS — M545 Low back pain, unspecified: Secondary | ICD-10-CM

## 2017-10-10 DIAGNOSIS — L97919 Non-pressure chronic ulcer of unspecified part of right lower leg with unspecified severity: Secondary | ICD-10-CM | POA: Diagnosis not present

## 2017-10-10 DIAGNOSIS — I87331 Chronic venous hypertension (idiopathic) with ulcer and inflammation of right lower extremity: Secondary | ICD-10-CM | POA: Diagnosis not present

## 2017-10-10 DIAGNOSIS — M5442 Lumbago with sciatica, left side: Secondary | ICD-10-CM | POA: Diagnosis not present

## 2017-10-10 MED ORDER — PREDNISONE 10 MG PO TABS: 10.0000 mg | ORAL_TABLET | Freq: Every day | ORAL | 0 refills | Status: DC

## 2017-10-10 NOTE — Progress Notes (Signed)
Office Visit Note   Patient: Alice [name]           Date of Birth: 1970/06/07           MRN: [id number] Visit Date: 10/10/2017              Requested by: Benito Mccreedy, MD [address] [place], [place] [address] PCP: Benito Mccreedy, MD  Chief Complaint  Patient presents with  . Right Leg - Follow-up  . Lower Back - Pain      HPI: Patient presents in follow-up for chronic venous stasis ulcer right leg she is undergoing serial compression wraps which have been working well she also has degenerative disc disease of the lumbar spine she is just completed a prednisone course and the symptoms have reoccurred after the prednisone stopped.  Assessment & Plan: Visit Diagnoses:  1. Chronic midline low back pain without sciatica   2. 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 Acute left-sided low back pain with left-sided sciatica     Plan: We will call in a new prescription for prednisone she will take 10 mg with breakfast.  She will change the Dynaflex wrap at home as needed.  Follow-Up Instructions: Return in about 1 week (around 10/17/2017).   Ortho Exam  Patient is alert, oriented, no adenopathy, well-dressed, normal affect, normal respiratory effort. Examination the wound has excellent granulation tissue there is no maceration no drainage no odor no signs of infection.  There is superficial epithelialization around the wound edges.  Examination of lumbar spine she has no radicular symptoms no weakness in the lower extremity her pain is primarily of her lower lumbar spine.  Imaging: Xr Lumbar Spine 2-3 Views  Result Date: 10/10/2017 2 view radiographs of the lumbar spine shows advanced degenerative disc disease with collapse at L5-S1 and L4-5 with osteophytic bone spurs and a grade 1 spondylolisthesis at L4-5.  No images are attached to the encounter.  Labs: Lab Results  Component Value Date   REPTSTATUS 01/04/2017 FINAL 12/30/2016   REPTSTATUS 01/04/2017 FINAL 12/30/2016   GRAMSTAIN  05/17/2013    MODERATE WBC PRESENT, PREDOMINANTLY MONONUCLEAR MODERATE GRAM POSITIVE COCCI IN PAIRS IN CHAINS RARE GRAM NEGATIVE RODS Gram Stain Report Called to,Read Back By and Verified With: HOPPER,M RN @ [phone] 05/17/13 LEONARD,A   GRAMSTAIN  05/17/2013    MODERATE WBC PRESENT, PREDOMINANTLY MONONUCLEAR MODERATE SQUAMOUS EPITHELIAL CELLS PRESENT MODERATE GRAM POSITIVE COCCI IN PAIRS IN CHAINS RARE GRAM NEGATIVE RODS Performed at [facility]   CULT NO GROWTH 5 DAYS 12/30/2016   CULT NO GROWTH 5 DAYS 12/30/2016   LABORGA ESCHERICHIA COLI (A) 12/29/2016     Lab Results  Component Value Date   ALBUMIN 2.2 (L) 01/03/2017   ALBUMIN 2.1 (L) 01/02/2017   ALBUMIN 3.3 (L) 07/13/2015    Body mass index is 53.26 kg/m.  Orders:  Orders Placed This Encounter  Procedures  . XR Lumbar Spine 2-3 Views   No orders of the defined types were placed in this encounter.    Procedures: No procedures performed  Clinical Data: No additional findings.  ROS:  All other systems negative, except as noted in the HPI. Review of Systems  Objective: Vital Signs: Ht 5\' 6"  (1.676 m)   Wt (!) 330 lb (149.7 kg)   BMI 53.26 kg/m   Specialty Comments:  No specialty comments available.  PMFS History: Patient Active Problem List   Diagnosis  Date Noted  . Severe malnutrition ([place]) 08/24/2017  . Acute lower UTI 12/29/2016  . Sepsis ([place]) 12/29/2016  . Acute kidney injury superimposed on CKD ([place]) 12/28/2016  . Atypical chest pain 12/28/2016  . Hx of skin graft 08/01/2016  . 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 ([place]) 07/25/2016  . 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 Trichomonal infection 11/24/2015  . Abdominal pain 05/02/2015  . Symptomatic cholelithiasis 04/16/2015  . Acute bilateral upper  abdominal pain 04/16/2015  . Microcytic anemia 05/18/2013  . Hypotension, unspecified 05/17/2013  . CKD (chronic kidney disease), stage III ([place]) 05/17/2013  . Hypokalemia 05/17/2013  . Anal fissure 02/06/2013  . Anal skin tag 02/06/2013  . ALLERGIC RHINITIS 03/05/2010  . LOW BACK PAIN SYNDROME 12/13/2007  . WOUND INFECTION 10/10/2007  . OBESITY, MORBID 12/02/2006  . HTN (hypertension) 12/02/2006  . History of pulmonary embolism 12/02/2006  . History of DVT (deep vein thrombosis) 12/02/2006  . 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 GASTROESOPHAGEAL REFLUX DISEASE 12/02/2006  . OSA (obstructive sleep apnea) 12/02/2006   Past Medical History:  Diagnosis Date  . Anemia   . Anxiety   . Chronic bronchitis ([place])   . Chronic upper back pain   . Depression   . DVT (deep venous thrombosis) (HCC)    BLE  . GERD (gastroesophageal reflux disease)   . Headache    "weekly" (04/16/2015)  . Hypertension   . Migraine    "monthly" (04/16/2015)  . Obesity   . Pneumonia 2014  . Pulmonary embolism ([place])   . Sinusitis nasal   . Sleep apnea    "I'm suppose to wear a mask but I don't" (04/16/2015)  . Varicose veins of right lower extremity   . Venous stasis of lower extremity    right    Family History  Problem Relation Age of Onset  . Kidney disease Mother        kidney transplant    Past Surgical History:  Procedure Laterality Date  . CHOLECYSTECTOMY N/A 04/18/2015   Procedure: LAPAROSCOPIC CHOLECYSTECTOMY;  Surgeon: Ralene Ok, MD;  Location: [facility];  Service: General;  Laterality: N/A;  . HYSTEROSCOPY W/D&C  12/24/2001   Archie Endo 09/28/2010  . I&D EXTREMITY Right 07/25/2016   Procedure: IRRIGATION AND DEBRIDEMENT RIGHT LEG ULCER, APPLY VERAFLO VAC;  Surgeon: [name], MD;  Location: [facility];  Service: Orthopedics;  Laterality: Right;  . INCISE AND DRAIN ABCESS Right 07/14/2016  . INCISION AND DRAINAGE Right 09/10/2008   leg:  skin and soft tissue and muscle/notes  09/15/2010  . INCISION AND DRAINAGE Right 01/01/2008   Chronic venous stasis insufficiency ulcer,/notes 09/14/2010/  . INCISION AND DRAINAGE Right 12/8826   calf w/application wound vac/notes 07/20/2010  . INCISION AND DRAINAGE Right 07/25/2016   IRRIGATION AND DEBRIDEMENT RIGHT LEG ULCER,  . LAPAROSCOPIC GASTRIC BYPASS  ~ 2007  . SKIN GRAFT SPLIT THICKNESS LEG / FOOT Right 07/25/2016   LEG  . SKIN SPLIT GRAFT Right 07/27/2016   Procedure: SKIN GRAFT RIGHT LEG WITH THERASKIN APPLICATION;  Surgeon: [name], MD;  Location: [facility];  Service: Orthopedics;  Laterality: Right;  . TONSILLECTOMY     Social History   Occupational History  . Not on file  Tobacco Use  . Smoking status: Never Smoker  . Smokeless tobacco: Never Used  Substance and Sexual Activity  . Alcohol use: No  . Drug use: No  .  Sexual activity: Yes    Partners: Male    Birth control/protection: IUD

## 2017-10-10 NOTE — Progress Notes (Signed)
xr

## 2017-10-16 ENCOUNTER — Encounter (INDEPENDENT_AMBULATORY_CARE_PROVIDER_SITE_OTHER): Payer: Self-pay | Admitting: Orthopedic Surgery

## 2017-10-16 ENCOUNTER — Ambulatory Visit (INDEPENDENT_AMBULATORY_CARE_PROVIDER_SITE_OTHER): Payer: Medicare Other | Admitting: Orthopedic Surgery

## 2017-10-16 DIAGNOSIS — L97919 Non-pressure chronic ulcer of unspecified part of right lower leg with unspecified severity: Secondary | ICD-10-CM

## 2017-10-16 DIAGNOSIS — M545 Low back pain, unspecified: Secondary | ICD-10-CM

## 2017-10-16 DIAGNOSIS — I87331 Chronic venous hypertension (idiopathic) with ulcer and inflammation of right lower extremity: Secondary | ICD-10-CM | POA: Diagnosis not present

## 2017-10-16 DIAGNOSIS — G8929 Other chronic pain: Secondary | ICD-10-CM

## 2017-10-16 NOTE — Progress Notes (Signed)
Office Visit Note   Patient: Alice [name]           Date of Birth: [birth year]           MRN: [id number] Visit Date: 10/16/2017              Requested by: Benito Mccreedy, MD [address] [place], [place] [address] PCP: Benito Mccreedy, MD  Chief Complaint  Patient presents with  . Right Leg - Follow-up, Pain      HPI: Patient is a 47 year old woman who presents in follow-up for chronic venous ulcer right leg and lower back pain with radicular symptoms into the left buttocks region.  Patient denies any radicular symptoms down both legs.  She is doing dressing changes at home.  Patient states that she is trying to cut out carbohydrates she eats chicken for protein and does eat greens.  Assessment & Plan: Visit Diagnoses:  1. 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 Chronic midline low back pain without sciatica     Plan: Continue with the compressive wraps continue with proper diet patient is taking prednisone for her back pain.  Follow-Up Instructions: Return in about 3 weeks (around 11/06/2017).   Ortho Exam  Patient is alert, oriented, no adenopathy, well-dressed, normal affect, normal respiratory effort. Examination patient has no radicular symptoms or motor weakness in either lower extremity.  Her back pain seems to be stable.  Examination the right leg the ulcer is also stable there is no drainage there is no maceration no signs of infection there is essentially 90% healthy granulation tissue without exposed bone or tendon.  Imaging: No results found. No images are attached to the encounter.  Labs: Lab Results  Component Value Date   REPTSTATUS 01/04/2017 FINAL 12/30/2016   REPTSTATUS 01/04/2017 FINAL 12/30/2016   GRAMSTAIN  05/17/2013    MODERATE WBC PRESENT, PREDOMINANTLY MONONUCLEAR MODERATE GRAM POSITIVE COCCI IN PAIRS IN CHAINS RARE GRAM NEGATIVE RODS Gram Stain Report Called  to,Read Back By and Verified With: HOPPER,M RN @ [phone] 05/17/13 LEONARD,A   GRAMSTAIN  05/17/2013    MODERATE WBC PRESENT, PREDOMINANTLY MONONUCLEAR MODERATE SQUAMOUS EPITHELIAL CELLS PRESENT MODERATE GRAM POSITIVE COCCI IN PAIRS IN CHAINS RARE GRAM NEGATIVE RODS Performed at [facility]   CULT NO GROWTH 5 DAYS 12/30/2016   CULT NO GROWTH 5 DAYS 12/30/2016   LABORGA ESCHERICHIA COLI (A) 12/29/2016     Lab Results  Component Value Date   ALBUMIN 2.2 (L) 01/03/2017   ALBUMIN 2.1 (L) 01/02/2017   ALBUMIN 3.3 (L) 07/13/2015    There is no height or weight on file to calculate BMI.  Orders:  No orders of the defined types were placed in this encounter.  No orders of the defined types were placed in this encounter.    Procedures: No procedures performed  Clinical Data: No additional findings.  ROS:  All other systems negative, except as noted in the HPI. Review of Systems  Objective: Vital Signs: There were no vitals taken for this visit.  Specialty Comments:  No specialty comments available.  PMFS History: Patient Active Problem List   Diagnosis Date Noted  . Severe malnutrition ([place]) 08/24/2017  . Acute lower UTI 12/29/2016  . Sepsis ([place]) 12/29/2016  . Acute kidney injury superimposed on CKD ([place]) 12/28/2016  . Atypical chest pain 12/28/2016  . Hx of skin graft 08/01/2016  . Venous ulcer of right lower extremity with varicose  veins ([place]) 07/25/2016  . 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 Trichomonal infection 11/24/2015  . Abdominal pain 05/02/2015  . Symptomatic cholelithiasis 04/16/2015  . Acute bilateral upper abdominal pain 04/16/2015  . Microcytic anemia 05/18/2013  . Hypotension, unspecified 05/17/2013  . CKD (chronic kidney disease), stage III ([place]) 05/17/2013  . Hypokalemia 05/17/2013  . Anal fissure 02/06/2013  . Anal skin tag 02/06/2013  . ALLERGIC RHINITIS  03/05/2010  . LOW BACK PAIN SYNDROME 12/13/2007  . WOUND INFECTION 10/10/2007  . OBESITY, MORBID 12/02/2006  . HTN (hypertension) 12/02/2006  . History of pulmonary embolism 12/02/2006  . History of DVT (deep vein thrombosis) 12/02/2006  . 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 GASTROESOPHAGEAL REFLUX DISEASE 12/02/2006  . OSA (obstructive sleep apnea) 12/02/2006   Past Medical History:  Diagnosis Date  . Anemia   . Anxiety   . Chronic bronchitis ([place])   . Chronic upper back pain   . Depression   . DVT (deep venous thrombosis) (HCC)    BLE  . GERD (gastroesophageal reflux disease)   . Headache    "weekly" (04/16/2015)  . Hypertension   . Migraine    "monthly" (04/16/2015)  . Obesity   . Pneumonia 2014  . Pulmonary embolism ([place])   . Sinusitis nasal   . Sleep apnea    "I'm suppose to wear a mask but I don't" (04/16/2015)  . Varicose veins of right lower extremity   . Venous stasis of lower extremity    right    Family History  Problem Relation Age of Onset  . Kidney disease Mother        kidney transplant    Past Surgical History:  Procedure Laterality Date  . CHOLECYSTECTOMY N/A 04/18/2015   Procedure: LAPAROSCOPIC CHOLECYSTECTOMY;  Surgeon: Ralene Ok, MD;  Location: [facility];  Service: General;  Laterality: N/A;  . HYSTEROSCOPY W/D&C  12/24/2001   Archie Endo 09/28/2010  . I&D EXTREMITY Right 07/25/2016   Procedure: IRRIGATION AND DEBRIDEMENT RIGHT LEG ULCER, APPLY VERAFLO VAC;  Surgeon: [name], MD;  Location: [facility];  Service: Orthopedics;  Laterality: Right;  . INCISE AND DRAIN ABCESS Right 07/14/2016  . INCISION AND DRAINAGE Right 09/10/2008   leg:  skin and soft tissue and muscle/notes 09/15/2010  . INCISION AND DRAINAGE Right 01/01/2008   Chronic venous stasis insufficiency ulcer,/notes 09/14/2010/  . INCISION AND DRAINAGE Right 08/100   calf w/application wound vac/notes 07/20/2010  . INCISION AND DRAINAGE Right 07/25/2016   IRRIGATION AND  DEBRIDEMENT RIGHT LEG ULCER,  . LAPAROSCOPIC GASTRIC BYPASS  ~ 2007  . SKIN GRAFT SPLIT THICKNESS LEG / FOOT Right 07/25/2016   LEG  . SKIN SPLIT GRAFT Right 07/27/2016   Procedure: SKIN GRAFT RIGHT LEG WITH THERASKIN APPLICATION;  Surgeon: [name], MD;  Location: [facility];  Service: Orthopedics;  Laterality: Right;  . TONSILLECTOMY     Social History   Occupational History  . Not on file  Tobacco Use  . Smoking status: Never Smoker  . Smokeless tobacco: Never Used  Substance and Sexual Activity  . Alcohol use: No  . Drug use: No  . Sexual activity: Yes    Partners: Male    Birth control/protection: IUD

## 2017-10-23 ENCOUNTER — Ambulatory Visit (INDEPENDENT_AMBULATORY_CARE_PROVIDER_SITE_OTHER): Payer: Medicare Other | Admitting: Orthopedic Surgery

## 2017-11-08 ENCOUNTER — Other Ambulatory Visit (INDEPENDENT_AMBULATORY_CARE_PROVIDER_SITE_OTHER): Payer: Self-pay | Admitting: Family

## 2017-11-08 ENCOUNTER — Telehealth (INDEPENDENT_AMBULATORY_CARE_PROVIDER_SITE_OTHER): Payer: Self-pay | Admitting: Orthopedic Surgery

## 2017-11-08 DIAGNOSIS — M5442 Lumbago with sciatica, left side: Principal | ICD-10-CM

## 2017-11-08 DIAGNOSIS — G8929 Other chronic pain: Secondary | ICD-10-CM

## 2017-11-08 MED ORDER — PREDNISONE 10 MG PO TABS: 10.0000 mg | ORAL_TABLET | Freq: Every day | ORAL | 0 refills | Status: DC

## 2017-11-08 NOTE — Telephone Encounter (Signed)
Ok to refill for LBP with ras s/s

## 2017-11-08 NOTE — Telephone Encounter (Signed)
I called pt to advise of message below. She has an appt with Erin on 11/15/17 is there anyway that she can be set up for her MRI before then so we can discuss results?

## 2017-11-08 NOTE — Telephone Encounter (Signed)
Rx refill Prednisone/[facility] @ [address]ician

## 2017-11-09 NOTE — Telephone Encounter (Signed)
Pt is scheduled July 1 at 12p at [facility], pt to arrive 15 mins early, [address] Pt aware of appt

## 2017-11-13 ENCOUNTER — Ambulatory Visit (HOSPITAL_COMMUNITY)
Admission: RE | Admit: 2017-11-13 | Discharge: 2017-11-13 | Disposition: A | Discharge status: Home / Self Care | Payer: Medicare Other | Source: Ambulatory Visit | Attending: Family | Admitting: Family

## 2017-11-13 DIAGNOSIS — G8929 Other chronic pain: Secondary | ICD-10-CM

## 2017-11-13 DIAGNOSIS — M5442 Lumbago with sciatica, left side: Principal | ICD-10-CM

## 2017-11-14 ENCOUNTER — Ambulatory Visit (HOSPITAL_COMMUNITY)
Admission: RE | Admit: 2017-11-14 | Discharge: 2017-11-14 | Disposition: A | Discharge status: Home / Self Care | Payer: Medicare Other | Source: Ambulatory Visit | Attending: Family | Admitting: Family

## 2017-11-14 DIAGNOSIS — M47816 Spondylosis without myelopathy or radiculopathy, lumbar region: Secondary | ICD-10-CM | POA: Diagnosis not present

## 2017-11-14 DIAGNOSIS — M47897 Other spondylosis, lumbosacral region: Secondary | ICD-10-CM | POA: Diagnosis not present

## 2017-11-14 DIAGNOSIS — G8929 Other chronic pain: Secondary | ICD-10-CM | POA: Diagnosis not present

## 2017-11-14 DIAGNOSIS — M4316 Spondylolisthesis, lumbar region: Secondary | ICD-10-CM | POA: Diagnosis not present

## 2017-11-14 DIAGNOSIS — M8938 Hypertrophy of bone, other site: Secondary | ICD-10-CM | POA: Insufficient documentation

## 2017-11-14 DIAGNOSIS — M5442 Lumbago with sciatica, left side: Secondary | ICD-10-CM | POA: Insufficient documentation

## 2017-11-14 DIAGNOSIS — M5117 Intervertebral disc disorders with radiculopathy, lumbosacral region: Secondary | ICD-10-CM | POA: Diagnosis not present

## 2017-11-14 DIAGNOSIS — M4807 Spinal stenosis, lumbosacral region: Secondary | ICD-10-CM | POA: Diagnosis not present

## 2017-11-14 DIAGNOSIS — M48061 Spinal stenosis, lumbar region without neurogenic claudication: Secondary | ICD-10-CM | POA: Diagnosis not present

## 2017-11-15 ENCOUNTER — Ambulatory Visit (INDEPENDENT_AMBULATORY_CARE_PROVIDER_SITE_OTHER): Payer: Medicare Other | Admitting: Family

## 2017-11-15 ENCOUNTER — Encounter (INDEPENDENT_AMBULATORY_CARE_PROVIDER_SITE_OTHER): Payer: Self-pay | Admitting: Family

## 2017-11-15 VITALS — Ht 66.0 in | Wt 330.0 lb

## 2017-11-15 DIAGNOSIS — M79605 Pain in left leg: Secondary | ICD-10-CM | POA: Diagnosis not present

## 2017-11-15 DIAGNOSIS — M545 Low back pain, unspecified: Secondary | ICD-10-CM

## 2017-11-20 ENCOUNTER — Encounter (INDEPENDENT_AMBULATORY_CARE_PROVIDER_SITE_OTHER): Payer: Self-pay | Admitting: Family

## 2017-11-20 ENCOUNTER — Telehealth (INDEPENDENT_AMBULATORY_CARE_PROVIDER_SITE_OTHER): Payer: Self-pay | Admitting: *Deleted

## 2017-11-20 NOTE — Progress Notes (Signed)
Office Visit Note   Patient: AHNA KONKLE           Date of Birth: 03-02-1971           MRN: [id number] Visit Date: 11/15/2017              Requested by: Benito Mccreedy, MD [place], [place] [address] PCP: Benito Mccreedy, MD  Chief Complaint  Patient presents with  . Lower Back - Follow-up    MRI L spine review.       HPI: Patient is a 47 year old woman who presents today for MRI review with ongoing lower back pain with radicular symptoms into the left buttocks region.  Patient denies any radicular symptoms further down either leg. Has been taking Prednisone 10 mg daily for several weeks now. This worked well initially but has since begun to help less and less.    Does state when leans forward to wash dishes feels relief of some back pain.  Assessment & Plan: Visit Diagnoses:  1. Lumbar pain with radiation down left leg     Plan: have referred to Dr. Ernestina Patches for possible ESI.  Follow-Up Instructions: No follow-ups on file.   Back Exam   Tenderness  The patient is experiencing tenderness in the lumbar.  Range of Motion  The patient has normal back ROM.  Muscle Strength  The patient has normal back strength.  Tests  Straight leg raise right: negative Straight leg raise left: negative      Patient is alert, oriented, no adenopathy, well-dressed, normal affect, normal respiratory effort.  Imaging: No results found. No images are attached to the encounter.  Labs: Lab Results  Component Value Date   REPTSTATUS 01/04/2017 FINAL 12/30/2016   REPTSTATUS 01/04/2017 FINAL 12/30/2016   GRAMSTAIN  05/17/2013    MODERATE WBC PRESENT, PREDOMINANTLY MONONUCLEAR MODERATE GRAM POSITIVE COCCI IN PAIRS IN CHAINS RARE GRAM NEGATIVE RODS Gram Stain Report Called to,Read Back By and Verified With: HOPPER,M RN @ [phone] 05/17/13 LEONARD,A   GRAMSTAIN  05/17/2013    MODERATE WBC PRESENT, PREDOMINANTLY MONONUCLEAR MODERATE SQUAMOUS EPITHELIAL CELLS  PRESENT MODERATE GRAM POSITIVE COCCI IN PAIRS IN CHAINS RARE GRAM NEGATIVE RODS Performed at [facility]   CULT NO GROWTH 5 DAYS 12/30/2016   CULT NO GROWTH 5 DAYS 12/30/2016   LABORGA ESCHERICHIA COLI (A) 12/29/2016     Lab Results  Component Value Date   ALBUMIN 2.2 (L) 01/03/2017   ALBUMIN 2.1 (L) 01/02/2017   ALBUMIN 3.3 (L) 07/13/2015    Body mass index is 53.26 kg/m.  Orders:  Orders Placed This Encounter  Procedures  . Ambulatory referral to Physical Medicine Rehab   No orders of the defined types were placed in this encounter.    Procedures: No procedures performed  Clinical Data: No additional findings.  ROS:  All other systems negative, except as noted in the HPI. Review of Systems  Constitutional: Negative for chills and fever.  Musculoskeletal: Positive for back pain.  Skin: Positive for wound.  Neurological: Positive for numbness. Negative for weakness.    Objective: Vital Signs: Ht 5\' 6"  (1.676 m)   Wt (!) 330 lb (149.7 kg)   BMI 53.26 kg/m   Specialty Comments:  No specialty comments available.  PMFS History: Patient Active Problem List   Diagnosis Date Noted  . Severe malnutrition ([place]) 08/24/2017  . Acute lower UTI 12/29/2016  . Sepsis ([place]) 12/29/2016  . Acute kidney injury superimposed on CKD ([place]) 12/28/2016  .  Atypical chest pain 12/28/2016  . Hx of skin graft 08/01/2016  . 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 ([place]) 07/25/2016  . 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 Trichomonal infection 11/24/2015  . Abdominal pain 05/02/2015  . Symptomatic cholelithiasis 04/16/2015  . Acute bilateral upper abdominal pain 04/16/2015  . Microcytic anemia 05/18/2013  . Hypotension, unspecified 05/17/2013  . CKD (chronic kidney disease), stage III ([place]) 05/17/2013  . Hypokalemia 05/17/2013  . Anal fissure 02/06/2013  . Anal skin tag  02/06/2013  . ALLERGIC RHINITIS 03/05/2010  . LOW BACK PAIN SYNDROME 12/13/2007  . WOUND INFECTION 10/10/2007  . OBESITY, MORBID 12/02/2006  . HTN (hypertension) 12/02/2006  . History of pulmonary embolism 12/02/2006  . History of DVT (deep vein thrombosis) 12/02/2006  . 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 GASTROESOPHAGEAL REFLUX DISEASE 12/02/2006  . OSA (obstructive sleep apnea) 12/02/2006   Past Medical History:  Diagnosis Date  . Anemia   . Anxiety   . Chronic bronchitis ([place])   . Chronic upper back pain   . Depression   . DVT (deep venous thrombosis) (HCC)    BLE  . GERD (gastroesophageal reflux disease)   . Headache    "weekly" (04/16/2015)  . Hypertension   . Migraine    "monthly" (04/16/2015)  . Obesity   . Pneumonia 2014  . Pulmonary embolism ([place])   . Sinusitis nasal   . Sleep apnea    "I'm suppose to wear a mask but I don't" (04/16/2015)  . Varicose veins of right lower extremity   . Venous stasis of lower extremity    right    Family History  Problem Relation Age of Onset  . Kidney disease Mother        kidney transplant    Past Surgical History:  Procedure Laterality Date  . CHOLECYSTECTOMY N/A 04/18/2015   Procedure: LAPAROSCOPIC CHOLECYSTECTOMY;  Surgeon: Ralene Ok, MD;  Location: [facility];  Service: General;  Laterality: N/A;  . HYSTEROSCOPY W/D&C  12/24/2001   Archie Endo 09/28/2010  . I&D EXTREMITY Right 07/25/2016   Procedure: IRRIGATION AND DEBRIDEMENT RIGHT LEG ULCER, APPLY VERAFLO VAC;  Surgeon: [name], MD;  Location: [facility];  Service: Orthopedics;  Laterality: Right;  . INCISE AND DRAIN ABCESS Right 07/14/2016  . INCISION AND DRAINAGE Right 09/10/2008   leg:  skin and soft tissue and muscle/notes 09/15/2010  . INCISION AND DRAINAGE Right 01/01/2008   Chronic venous stasis insufficiency ulcer,/notes 09/14/2010/  . INCISION AND DRAINAGE Right 07/2669   calf w/application wound vac/notes 07/20/2010  . INCISION AND DRAINAGE Right  07/25/2016   IRRIGATION AND DEBRIDEMENT RIGHT LEG ULCER,  . LAPAROSCOPIC GASTRIC BYPASS  ~ 2007  . SKIN GRAFT SPLIT THICKNESS LEG / FOOT Right 07/25/2016   LEG  . SKIN SPLIT GRAFT Right 07/27/2016   Procedure: SKIN GRAFT RIGHT LEG WITH THERASKIN APPLICATION;  Surgeon: [name], MD;  Location: [facility];  Service: Orthopedics;  Laterality: Right;  . TONSILLECTOMY     Social History   Occupational History  . Not on file  Tobacco Use  . Smoking status: Never Smoker  . Smokeless tobacco: Never Used  Substance and Sexual Activity  . Alcohol use: No  . Drug use: No  . Sexual activity: Yes    Partners: Male    Birth control/protection: IUD

## 2017-11-20 NOTE — Telephone Encounter (Signed)
No ma'am that appt with Junie Panning if for wound care.

## 2017-11-22 ENCOUNTER — Ambulatory Visit (INDEPENDENT_AMBULATORY_CARE_PROVIDER_SITE_OTHER): Payer: Medicare Other | Admitting: Family

## 2017-11-22 ENCOUNTER — Encounter (INDEPENDENT_AMBULATORY_CARE_PROVIDER_SITE_OTHER): Payer: Self-pay | Admitting: Family

## 2017-11-22 VITALS — Ht 66.0 in | Wt 330.0 lb

## 2017-11-22 DIAGNOSIS — L97919 Non-pressure chronic ulcer of unspecified part of right lower leg with unspecified severity: Secondary | ICD-10-CM | POA: Diagnosis not present

## 2017-11-22 DIAGNOSIS — I87331 Chronic venous hypertension (idiopathic) with ulcer and inflammation of right lower extremity: Secondary | ICD-10-CM

## 2017-11-22 DIAGNOSIS — Z945 Skin transplant status: Secondary | ICD-10-CM

## 2017-11-22 NOTE — Progress Notes (Signed)
Office Visit Note   Patient: Alice [name]           Date of Birth: 11/07/1970           MRN: [id number] Visit Date: 11/22/2017              Requested by: Benito Mccreedy, MD [address], [place] [address] PCP: Benito Mccreedy, MD  Chief Complaint  Patient presents with  . Right Leg - Follow-up      HPI: Patient is a 47 year old woman who presents in follow-up for chronic venous ulcer right leg. Has been doing own wraps with profore and abd pads. Feels is not seeing much improvement.  Assessment & Plan: Visit Diagnoses:  1. 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 Hx of skin graft     Plan: Continue with the compressive wraps. continue with proper diet.  Follow-Up Instructions: Return in about 3 weeks (around 12/13/2017).   Ortho Exam  Patient is alert, oriented, no adenopathy, well-dressed, normal affect, normal respiratory effort. Examination the right leg the ulcer is also stable. there is no drainage there is no maceration no signs of infection there is essentially 95% healthy granulation tissue without exposed bone or tendon.  Imaging: No results found. No images are attached to the encounter.  Labs: Lab Results  Component Value Date   REPTSTATUS 01/04/2017 FINAL 12/30/2016   REPTSTATUS 01/04/2017 FINAL 12/30/2016   GRAMSTAIN  05/17/2013    MODERATE WBC PRESENT, PREDOMINANTLY MONONUCLEAR MODERATE GRAM POSITIVE COCCI IN PAIRS IN CHAINS RARE GRAM NEGATIVE RODS Gram Stain Report Called to,Read Back By and Verified With: HOPPER,M RN @ [phone] 05/17/13 LEONARD,A   GRAMSTAIN  05/17/2013    MODERATE WBC PRESENT, PREDOMINANTLY MONONUCLEAR MODERATE SQUAMOUS EPITHELIAL CELLS PRESENT MODERATE GRAM POSITIVE COCCI IN PAIRS IN CHAINS RARE GRAM NEGATIVE RODS Performed at [facility]   CULT NO GROWTH 5 DAYS 12/30/2016   CULT NO GROWTH 5 DAYS 12/30/2016   LABORGA ESCHERICHIA COLI (A)  12/29/2016     Lab Results  Component Value Date   ALBUMIN 2.2 (L) 01/03/2017   ALBUMIN 2.1 (L) 01/02/2017   ALBUMIN 3.3 (L) 07/13/2015    Body mass index is 53.26 kg/m.  Orders:  No orders of the defined types were placed in this encounter.  No orders of the defined types were placed in this encounter.    Procedures: No procedures performed  Clinical Data: No additional findings.  ROS:  All other systems negative, except as noted in the HPI. Review of Systems  Constitutional: Negative for chills and fever.  Skin: Positive for wound.    Objective: Vital Signs: Ht 5\' 6"  (1.676 m)   Wt (!) 330 lb (149.7 kg)   BMI 53.26 kg/m   Specialty Comments:  No specialty comments available.  PMFS History: Patient Active Problem List   Diagnosis Date Noted  . Severe malnutrition ([place]) 08/24/2017  . Acute lower UTI 12/29/2016  . Sepsis ([place]) 12/29/2016  . Acute kidney injury superimposed on CKD ([place]) 12/28/2016  . Atypical chest pain 12/28/2016  . Hx of skin graft 08/01/2016  . 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 ([place]) 07/25/2016  . 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 Trichomonal infection 11/24/2015  . Abdominal pain 05/02/2015  . Symptomatic cholelithiasis 04/16/2015  . Acute bilateral upper abdominal pain 04/16/2015  . Microcytic anemia 05/18/2013  .  Hypotension, unspecified 05/17/2013  . CKD (chronic kidney disease), stage III ([place]) 05/17/2013  . Hypokalemia 05/17/2013  . Anal fissure 02/06/2013  . Anal skin tag 02/06/2013  . ALLERGIC RHINITIS 03/05/2010  . LOW BACK PAIN SYNDROME 12/13/2007  . WOUND INFECTION 10/10/2007  . OBESITY, MORBID 12/02/2006  . HTN (hypertension) 12/02/2006  . History of pulmonary embolism 12/02/2006  . History of DVT (deep vein thrombosis) 12/02/2006  . 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 GASTROESOPHAGEAL REFLUX  DISEASE 12/02/2006  . OSA (obstructive sleep apnea) 12/02/2006   Past Medical History:  Diagnosis Date  . Anemia   . Anxiety   . Chronic bronchitis ([place])   . Chronic upper back pain   . Depression   . DVT (deep venous thrombosis) (HCC)    BLE  . GERD (gastroesophageal reflux disease)   . Headache    "weekly" (04/16/2015)  . Hypertension   . Migraine    "monthly" (04/16/2015)  . Obesity   . Pneumonia 2014  . Pulmonary embolism ([place])   . Sinusitis nasal   . Sleep apnea    "I'm suppose to wear a mask but I don't" (04/16/2015)  . Varicose veins of right lower extremity   . Venous stasis of lower extremity    right    Family History  Problem Relation Age of Onset  . Kidney disease Mother        kidney transplant    Past Surgical History:  Procedure Laterality Date  . CHOLECYSTECTOMY N/A 04/18/2015   Procedure: LAPAROSCOPIC CHOLECYSTECTOMY;  Surgeon: Ralene Ok, MD;  Location: [facility];  Service: General;  Laterality: N/A;  . HYSTEROSCOPY W/D&C  12/24/2001   Archie Endo 09/28/2010  . I&D EXTREMITY Right 07/25/2016   Procedure: IRRIGATION AND DEBRIDEMENT RIGHT LEG ULCER, APPLY VERAFLO VAC;  Surgeon: [name], MD;  Location: [facility];  Service: Orthopedics;  Laterality: Right;  . INCISE AND DRAIN ABCESS Right 07/14/2016  . INCISION AND DRAINAGE Right 09/10/2008   leg:  skin and soft tissue and muscle/notes 09/15/2010  . INCISION AND DRAINAGE Right 01/01/2008   Chronic venous stasis insufficiency ulcer,/notes 09/14/2010/  . INCISION AND DRAINAGE Right 08/3327   calf w/application wound vac/notes 07/20/2010  . INCISION AND DRAINAGE Right 07/25/2016   IRRIGATION AND DEBRIDEMENT RIGHT LEG ULCER,  . LAPAROSCOPIC GASTRIC BYPASS  ~ 2007  . SKIN GRAFT SPLIT THICKNESS LEG / FOOT Right 07/25/2016   LEG  . SKIN SPLIT GRAFT Right 07/27/2016   Procedure: SKIN GRAFT RIGHT LEG WITH THERASKIN APPLICATION;  Surgeon: [name], MD;  Location: [facility];  Service: Orthopedics;  Laterality: Right;  .  TONSILLECTOMY     Social History   Occupational History  . Not on file  Tobacco Use  . Smoking status: Never Smoker  . Smokeless tobacco: Never Used  Substance and Sexual Activity  . Alcohol use: No  . Drug use: No  . Sexual activity: Yes    Partners: Male    Birth control/protection: IUD

## 2017-11-29 ENCOUNTER — Ambulatory Visit (INDEPENDENT_AMBULATORY_CARE_PROVIDER_SITE_OTHER): Payer: Medicare Other | Admitting: Family

## 2017-12-07 ENCOUNTER — Ambulatory Visit (INDEPENDENT_AMBULATORY_CARE_PROVIDER_SITE_OTHER): Payer: Self-pay

## 2017-12-07 ENCOUNTER — Ambulatory Visit (INDEPENDENT_AMBULATORY_CARE_PROVIDER_SITE_OTHER): Payer: Medicare Other | Admitting: Physical Medicine and Rehabilitation

## 2017-12-07 ENCOUNTER — Encounter (INDEPENDENT_AMBULATORY_CARE_PROVIDER_SITE_OTHER): Payer: Self-pay | Admitting: Physical Medicine and Rehabilitation

## 2017-12-07 VITALS — BP 143/89 | HR 81

## 2017-12-07 DIAGNOSIS — M48061 Spinal stenosis, lumbar region without neurogenic claudication: Secondary | ICD-10-CM | POA: Diagnosis not present

## 2017-12-07 DIAGNOSIS — M5416 Radiculopathy, lumbar region: Secondary | ICD-10-CM

## 2017-12-07 DIAGNOSIS — M5116 Intervertebral disc disorders with radiculopathy, lumbar region: Secondary | ICD-10-CM

## 2017-12-07 MED ORDER — BETAMETHASONE SOD PHOS & ACET 6 (3-3) MG/ML IJ SUSP
12.0000 mg | Freq: Once | INTRAMUSCULAR | Status: AC
  Administered 2017-12-07: 12 mg

## 2017-12-07 NOTE — Patient Instructions (Signed)

## 2017-12-07 NOTE — Progress Notes (Signed)
 .  Numeric Pain Rating Scale and Functional Assessment Average Pain 10   In the last MONTH (on 0-10 scale) has pain interfered with the following?  1. General activity like being  able to carry out your everyday physical activities such as walking, climbing stairs, carrying groceries, or moving a chair?  Rating(3)   +Driver, +BT(ok for warfarin), -Dye Allergies.

## 2017-12-12 NOTE — Procedures (Signed)
Lumbosacral Transforaminal Epidural Steroid Injection - Sub-Pedicular Approach with Fluoroscopic Guidance  Patient: Alice [name]      Date of Birth: 30-Sep-1970 MRN: [id number] PCP: Benito Mccreedy, MD      Visit Date: 12/07/2017   Universal Protocol:    Date/Time: 12/07/2017  Consent Given By: the patient  Position: PRONE  Additional Comments: Vital signs were monitored before and after the procedure. Patient was prepped and draped in the usual sterile fashion. The correct patient, procedure, and site was verified.   Injection Procedure Details:  Procedure Site One Meds Administered:  Meds ordered this encounter  Medications  . betamethasone acetate-betamethasone sodium phosphate (CELESTONE) injection 12 mg    Laterality: Left  Location/Site:  L3-L4  Needle size: 22 G  Needle type: Spinal  Needle Placement: Transforaminal  Findings:    -Comments: Excellent flow of contrast along the nerve and into the epidural space.  Procedure Details: After squaring off the end-plates to get a true AP view, the C-arm was positioned so that an oblique view of the foramen as noted above was visualized. The target area is just inferior to the "nose of the scotty dog" or sub pedicular. The soft tissues overlying this structure were infiltrated with 2-3 ml. of 1% Lidocaine without Epinephrine.  The spinal needle was inserted toward the target using a "trajectory" view along the fluoroscope beam.  Under AP and lateral visualization, the needle was advanced so it did not puncture dura and was located close the 6 O'Clock position of the pedical in AP tracterory. Biplanar projections were used to confirm position. Aspiration was confirmed to be negative for CSF and/or blood. A 1-2 ml. volume of Isovue-250 was injected and flow of contrast was noted at each level. Radiographs were obtained for documentation purposes.   After attaining the desired flow of contrast documented above, a 0.5 to  1.0 ml test dose of 0.25% Marcaine was injected into each respective transforaminal space.  The patient was observed for 90 seconds post injection.  After no sensory deficits were reported, and normal lower extremity motor function was noted,   the above injectate was administered so that equal amounts of the injectate were placed at each foramen (level) into the transforaminal epidural space.   Additional Comments:  The patient tolerated the procedure well Dressing: Band-Aid    Post-procedure details: Patient was observed during the procedure. Post-procedure instructions were reviewed.  Patient left the clinic in stable condition.

## 2017-12-12 NOTE — Progress Notes (Signed)
Alice [name] - 47 y.o. female MRN [id number]  Date of birth: 07-05-1970  Office Visit Note: Visit Date: 12/07/2017 PCP: Benito Mccreedy, MD Referred by: Benito Mccreedy, MD  Subjective: Chief Complaint  Patient presents with  . Lower Back - Pain  . Left Leg - Pain   HPI: Alice [name] is a 47 year old female followed by Dr. Meridee Score and Dondra Prader, FN-P.  She has chronic worsening 3 months of low back and severe left leg pain that she rates as a 10 out of 10.  Her case is complicated by the use of Coumadin anticoagulation.  She has failed conservative care through them and ultimately did obtain MRI of the lumbar spine.  This shows multilevel severe facet arthropathy and significant stenosis at L3-4 with listhesis and moderate stenosis at L4-5 and degenerative endplate changes.  A lot of this seems to be excessive for her age.  Other complicating factor is BMI of 53.3.  We are going to complete a transforaminal injection at the level of stenosis on the left side at L3-4.  Would contemplate L4-5 interlaminar injection but would need to have her off the blood thinner for that.  She is okay on the Coumadin for the transforaminal approach and this is per the newer standards and standard of care ongoing for the last couple of years.  We explained the risk benefits obviously to her.  Unfortunately she may end up being a surgical candidate for her back.   ROS Otherwise per HPI.  Assessment & Plan: Visit Diagnoses:  1. Lumbar radiculopathy   2. Radiculopathy due to lumbar intervertebral disc disorder   3. Spinal stenosis of lumbar region without neurogenic claudication     Plan: No additional findings.   Meds & Orders:  Meds ordered this encounter  Medications  . betamethasone acetate-betamethasone sodium phosphate (CELESTONE) injection 12 mg    Orders Placed This Encounter  Procedures  . XR C-ARM NO REPORT  . Epidural Steroid injection    Follow-up: Return if symptoms worsen or  fail to improve, for Consider L4-5 interlaminar epidural.   Procedures: No procedures performed  Lumbosacral Transforaminal Epidural Steroid Injection - Sub-Pedicular Approach with Fluoroscopic Guidance  Patient: Alice [name]      Date of Birth: 01-30-1971 MRN: [id number] PCP: Benito Mccreedy, MD      Visit Date: 12/07/2017   Universal Protocol:    Date/Time: 12/07/2017  Consent Given By: the patient  Position: PRONE  Additional Comments: Vital signs were monitored before and after the procedure. Patient was prepped and draped in the usual sterile fashion. The correct patient, procedure, and site was verified.   Injection Procedure Details:  Procedure Site One Meds Administered:  Meds ordered this encounter  Medications  . betamethasone acetate-betamethasone sodium phosphate (CELESTONE) injection 12 mg    Laterality: Left  Location/Site:  L3-L4  Needle size: 22 G  Needle type: Spinal  Needle Placement: Transforaminal  Findings:    -Comments: Excellent flow of contrast along the nerve and into the epidural space.  Procedure Details: After squaring off the end-plates to get a true AP view, the C-arm was positioned so that an oblique view of the foramen as noted above was visualized. The target area is just inferior to the "nose of the scotty dog" or sub pedicular. The soft tissues overlying this structure were infiltrated with 2-3 ml. of 1% Lidocaine without Epinephrine.  The spinal needle was inserted toward the target using a "trajectory" view along the fluoroscope beam.  Under AP and lateral visualization, the needle was advanced so it did not puncture dura and was located close the 6 O'Clock position of the pedical in AP tracterory. Biplanar projections were used to confirm position. Aspiration was confirmed to be negative for CSF and/or blood. A 1-2 ml. volume of Isovue-250 was injected and flow of contrast was noted at each level. Radiographs were obtained for  documentation purposes.   After attaining the desired flow of contrast documented above, a 0.5 to 1.0 ml test dose of 0.25% Marcaine was injected into each respective transforaminal space.  The patient was observed for 90 seconds post injection.  After no sensory deficits were reported, and normal lower extremity motor function was noted,   the above injectate was administered so that equal amounts of the injectate were placed at each foramen (level) into the transforaminal epidural space.   Additional Comments:  The patient tolerated the procedure well Dressing: Band-Aid    Post-procedure details: Patient was observed during the procedure. Post-procedure instructions were reviewed.  Patient left the clinic in stable condition.    Clinical History: MRI LUMBAR SPINE WITHOUT CONTRAST  TECHNIQUE: Multiplanar, multisequence MR imaging of the lumbar spine was performed. No intravenous contrast was administered.  COMPARISON:  10/10/2017 lumbar spine radiographs  FINDINGS: Segmentation:  Standard.  Alignment:  L3-4 grade 1 anterolisthesis.  Vertebrae: No fracture, evidence of discitis, or bone lesion. L4-5 and L5-S1 Modic 2 degenerative changes.  Conus medullaris and cauda equina: Conus extends to the L2 level. Conus and cauda equina appear normal.  Paraspinal and other soft tissues: Negative.  Disc levels:  L1-2: No significant disc displacement, foraminal stenosis, or canal stenosis.  L2-3: Minimal disc bulge. No significant foraminal or canal stenosis.  L3-4: Grade 1 anterolisthesis with small uncovered disc bulge and left-greater-than-right facet hypertrophy. Moderate bilateral foraminal stenosis. Moderate to severe canal stenosis. Small bilateral facet effusions.  L4-5: Diffuse disc bulge with endplate marginal osteophytes, small central disc protrusion, moderate bilateral facet hypertrophy, and ligamentum flavum hypertrophy. Moderate bilateral  foraminal stenosis. Moderate canal stenosis. The central protrusion narrows the lateral recesses with contact on descending L5 nerve roots.  L5-S1: Diffuse disc bulge and endplate marginal osteophytes with mild bilateral facet hypertrophy. Moderate bilateral foraminal stenosis. No significant canal stenosis.  IMPRESSION: 1. L3-4 grade 1 anterolisthesis with prominent facet hypertrophy. No acute osseous abnormality. 2. Age advanced lumbar spondylosis predominantly at the L3-4 through L5-S1 levels. 3. L3-4 moderate to severe and L4-5 moderate multifactorial canal stenosis. 4. Moderate bilateral foraminal stenosis at the L3-4, L4-5, and L5-S1 levels.   Electronically Signed   By: Kristine Garbe M.D.   On: 11/15/2017 04:38   She reports that she has never smoked. She has never used smokeless tobacco. No results for input(s): HGBA1C, LABURIC in the last 8760 hours.  Objective:  VS:  HT:    WT:   BMI:     BP:(!) 143/89  HR:81bpm  TEMP: ( )  RESP:  Physical Exam  Ortho Exam Imaging: No results found.  Past Medical/Family/Surgical/Social History: Medications & Allergies reviewed per EMR, new medications updated. Patient Active Problem List   Diagnosis Date Noted  . Severe malnutrition ([place]) 08/24/2017  . Acute lower UTI 12/29/2016  . Sepsis ([place]) 12/29/2016  . Acute kidney injury superimposed on CKD ([place]) 12/28/2016  . Atypical chest pain 12/28/2016  . Hx of skin graft 08/01/2016  . 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 ([place]) 07/25/2016  . Idiopathic chronic venous hypertension of  right lower extremity with ulcer and inflammation ([place]) 07/05/2016  . Trichomonal infection 11/24/2015  . Abdominal pain 05/02/2015  . Symptomatic cholelithiasis 04/16/2015  . Acute bilateral upper abdominal pain 04/16/2015  . Microcytic anemia 05/18/2013  . Hypotension, unspecified 05/17/2013  . CKD (chronic kidney disease), stage III ([place]) 05/17/2013  .  Hypokalemia 05/17/2013  . Anal fissure 02/06/2013  . Anal skin tag 02/06/2013  . ALLERGIC RHINITIS 03/05/2010  . LOW BACK PAIN SYNDROME 12/13/2007  . WOUND INFECTION 10/10/2007  . OBESITY, MORBID 12/02/2006  . HTN (hypertension) 12/02/2006  . History of pulmonary embolism 12/02/2006  . History of DVT (deep vein thrombosis) 12/02/2006  . 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 GASTROESOPHAGEAL REFLUX DISEASE 12/02/2006  . OSA (obstructive sleep apnea) 12/02/2006   Past Medical History:  Diagnosis Date  . Anemia   . Anxiety   . Chronic bronchitis ([facility])   . Chronic upper back pain   . Depression   . DVT (deep venous thrombosis) (HCC)    BLE  . GERD (gastroesophageal reflux disease)   . Headache    "weekly" (04/16/2015)  . Hypertension   . Migraine    "monthly" (04/16/2015)  . Obesity   . Pneumonia 2014  . Pulmonary embolism ([place])   . Sinusitis nasal   . Sleep apnea    "I'm suppose to wear a mask but I don't" (04/16/2015)  . Varicose veins of right lower extremity   . Venous stasis of lower extremity    right   Family History  Problem Relation Age of Onset  . Kidney disease Mother        kidney transplant   Past Surgical History:  Procedure Laterality Date  . CHOLECYSTECTOMY N/A 04/18/2015   Procedure: LAPAROSCOPIC CHOLECYSTECTOMY;  Surgeon: Ralene Ok, MD;  Location: [facility];  Service: General;  Laterality: N/A;  . HYSTEROSCOPY W/D&C  12/24/2001   Archie Endo 09/28/2010  . I&D EXTREMITY Right 07/25/2016   Procedure: IRRIGATION AND DEBRIDEMENT RIGHT LEG ULCER, APPLY VERAFLO VAC;  Surgeon: [name], MD;  Location: [facility];  Service: Orthopedics;  Laterality: Right;  . INCISE AND DRAIN ABCESS Right 07/14/2016  . INCISION AND DRAINAGE Right 09/10/2008   leg:  skin and soft tissue and muscle/notes 09/15/2010  . INCISION AND DRAINAGE Right 01/01/2008   Chronic venous stasis insufficiency ulcer,/notes 09/14/2010/  . INCISION AND DRAINAGE Right 07/8880   calf  w/application wound vac/notes 07/20/2010  . INCISION AND DRAINAGE Right 07/25/2016   IRRIGATION AND DEBRIDEMENT RIGHT LEG ULCER,  . LAPAROSCOPIC GASTRIC BYPASS  ~ 2007  . SKIN GRAFT SPLIT THICKNESS LEG / FOOT Right 07/25/2016   LEG  . SKIN SPLIT GRAFT Right 07/27/2016   Procedure: SKIN GRAFT RIGHT LEG WITH THERASKIN APPLICATION;  Surgeon: [name], MD;  Location: [facility];  Service: Orthopedics;  Laterality: Right;  . TONSILLECTOMY     Social History   Occupational History  . Not on file  Tobacco Use  . Smoking status: Never Smoker  . Smokeless tobacco: Never Used  Substance and Sexual Activity  . Alcohol use: No  . Drug use: No  . Sexual activity: Yes    Partners: Male    Birth control/protection: IUD

## 2017-12-14 ENCOUNTER — Ambulatory Visit (INDEPENDENT_AMBULATORY_CARE_PROVIDER_SITE_OTHER): Payer: Medicare Other | Admitting: Orthopedic Surgery

## 2017-12-18 ENCOUNTER — Ambulatory Visit (INDEPENDENT_AMBULATORY_CARE_PROVIDER_SITE_OTHER): Payer: Medicare Other | Admitting: Orthopedic Surgery

## 2017-12-19 ENCOUNTER — Ambulatory Visit (INDEPENDENT_AMBULATORY_CARE_PROVIDER_SITE_OTHER): Payer: Medicare Other | Admitting: Orthopedic Surgery

## 2017-12-19 DIAGNOSIS — I87331 Chronic venous hypertension (idiopathic) with ulcer and inflammation of right lower extremity: Secondary | ICD-10-CM

## 2017-12-19 DIAGNOSIS — L97919 Non-pressure chronic ulcer of unspecified part of right lower leg with unspecified severity: Secondary | ICD-10-CM | POA: Diagnosis not present

## 2017-12-21 DIAGNOSIS — G4733 Obstructive sleep apnea (adult) (pediatric): Secondary | ICD-10-CM | POA: Diagnosis not present

## 2017-12-21 DIAGNOSIS — F329 Major depressive disorder, single episode, unspecified: Secondary | ICD-10-CM | POA: Diagnosis not present

## 2017-12-21 DIAGNOSIS — Z7901 Long term (current) use of anticoagulants: Secondary | ICD-10-CM | POA: Diagnosis not present

## 2017-12-21 DIAGNOSIS — R7303 Prediabetes: Secondary | ICD-10-CM | POA: Diagnosis not present

## 2017-12-21 DIAGNOSIS — E559 Vitamin D deficiency, unspecified: Secondary | ICD-10-CM | POA: Diagnosis not present

## 2017-12-21 DIAGNOSIS — R1013 Epigastric pain: Secondary | ICD-10-CM | POA: Diagnosis not present

## 2017-12-21 DIAGNOSIS — G629 Polyneuropathy, unspecified: Secondary | ICD-10-CM | POA: Diagnosis not present

## 2017-12-21 DIAGNOSIS — I10 Essential (primary) hypertension: Secondary | ICD-10-CM | POA: Diagnosis not present

## 2017-12-26 ENCOUNTER — Encounter (INDEPENDENT_AMBULATORY_CARE_PROVIDER_SITE_OTHER): Payer: Self-pay | Admitting: Orthopedic Surgery

## 2017-12-26 ENCOUNTER — Ambulatory Visit (INDEPENDENT_AMBULATORY_CARE_PROVIDER_SITE_OTHER): Payer: Medicare Other | Admitting: Orthopedic Surgery

## 2017-12-26 VITALS — Ht 66.0 in | Wt 330.0 lb

## 2017-12-26 DIAGNOSIS — L97919 Non-pressure chronic ulcer of unspecified part of right lower leg with unspecified severity: Secondary | ICD-10-CM

## 2017-12-26 DIAGNOSIS — E43 Unspecified severe protein-calorie malnutrition: Secondary | ICD-10-CM

## 2017-12-26 DIAGNOSIS — I87331 Chronic venous hypertension (idiopathic) with ulcer and inflammation of right lower extremity: Secondary | ICD-10-CM

## 2017-12-26 MED ORDER — TRAMADOL HCL 50 MG PO TABS: 50.0000 mg | ORAL_TABLET | Freq: Four times a day (QID) | ORAL | 0 refills | Status: DC | PRN

## 2017-12-26 NOTE — Progress Notes (Signed)
Office Visit Note   Patient: BAYLEI SIEBELS           Date of Birth: [birth year]           MRN: [id number] Visit Date: 12/26/2017              Requested by: Benito Mccreedy, MD [address] [place], [place] [address] PCP: Benito Mccreedy, MD  Chief Complaint  Patient presents with  . Right Leg - Wound Check      HPI: Patient is a 47 year old woman who presents in follow-up for chronic venous insufficiency with severe protein caloric malnutrition.  On the last exam patient did have probiotics applied to the wound she states she felt like this caused a burning sensation.  Patient removed her dressing the night after application.  Assessment & Plan: Visit Diagnoses: No diagnosis found.  Plan: A compressive dressing was reapplied with Adaptic and the 3 layer compression wrap.  Patient was given a prescription for the 2 layer medical compression stocking 30 to 40 mm of compression.  Follow-Up Instructions: No follow-ups on file.   Ortho Exam  Patient is alert, oriented, no adenopathy, well-dressed, normal affect, normal respiratory effort. Examination patient does have decreased swelling in the leg it is currently 51 cm in circumference.  There is no redness no cellulitis no odor no drainage no signs of infection.  Patient does have good granulation tissue.  Imaging: No results found. No images are attached to the encounter.  Labs: Lab Results  Component Value Date   REPTSTATUS 01/04/2017 FINAL 12/30/2016   REPTSTATUS 01/04/2017 FINAL 12/30/2016   GRAMSTAIN  05/17/2013    MODERATE WBC PRESENT, PREDOMINANTLY MONONUCLEAR MODERATE GRAM POSITIVE COCCI IN PAIRS IN CHAINS RARE GRAM NEGATIVE RODS Gram Stain Report Called to,Read Back By and Verified With: HOPPER,M RN @ [phone] 05/17/13 LEONARD,A   GRAMSTAIN  05/17/2013    MODERATE WBC PRESENT, PREDOMINANTLY MONONUCLEAR MODERATE SQUAMOUS EPITHELIAL CELLS PRESENT MODERATE GRAM POSITIVE COCCI IN PAIRS IN CHAINS RARE  GRAM NEGATIVE RODS Performed at [facility]   CULT NO GROWTH 5 DAYS 12/30/2016   CULT NO GROWTH 5 DAYS 12/30/2016   LABORGA ESCHERICHIA COLI (A) 12/29/2016     Lab Results  Component Value Date   ALBUMIN 2.2 (L) 01/03/2017   ALBUMIN 2.1 (L) 01/02/2017   ALBUMIN 3.3 (L) 07/13/2015    Body mass index is 53.26 kg/m.  Orders:  No orders of the defined types were placed in this encounter.  No orders of the defined types were placed in this encounter.    Procedures: No procedures performed  Clinical Data: No additional findings.  ROS:  All other systems negative, except as noted in the HPI. Review of Systems  Objective: Vital Signs: Ht 5\' 6"  (1.676 m)   Wt (!) 330 lb (149.7 kg)   BMI 53.26 kg/m   Specialty Comments:  No specialty comments available.  PMFS History: Patient Active Problem List   Diagnosis Date Noted  . Severe malnutrition ([place]) 08/24/2017  . Acute lower UTI 12/29/2016  . Sepsis ([place]) 12/29/2016  . Acute kidney injury superimposed on CKD ([place]) 12/28/2016  . Atypical chest pain 12/28/2016  . Hx of skin graft 08/01/2016  . 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 ([place]) 07/25/2016  . 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 ([place]) 07/05/2016  . Trichomonal infection 11/24/2015  . Abdominal pain 05/02/2015  . Symptomatic cholelithiasis 04/16/2015  . Acute bilateral  upper abdominal pain 04/16/2015  . Microcytic anemia 05/18/2013  . Hypotension, unspecified 05/17/2013  . CKD (chronic kidney disease), stage III ([place]) 05/17/2013  . Hypokalemia 05/17/2013  . Anal fissure 02/06/2013  . Anal skin tag 02/06/2013  . ALLERGIC RHINITIS 03/05/2010  . LOW BACK PAIN SYNDROME 12/13/2007  . WOUND INFECTION 10/10/2007  . OBESITY, MORBID 12/02/2006  . HTN (hypertension) 12/02/2006  . History of pulmonary embolism 12/02/2006  . History of DVT (deep vein thrombosis) 12/02/2006  .  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 GASTROESOPHAGEAL REFLUX DISEASE 12/02/2006  . OSA (obstructive sleep apnea) 12/02/2006   Past Medical History:  Diagnosis Date  . Anemia   . Anxiety   . Chronic bronchitis ([place])   . Chronic upper back pain   . Depression   . DVT (deep venous thrombosis) (H[facility])    BLE  . GERD (gastroesophageal reflux disease)   . Headache    "weekly" (04/16/2015)  . Hypertension   . Migraine    "monthly" (04/16/2015)  . Obesity   . Pneumonia 2014  . Pulmonary embolism ([place])   . Sinusitis nasal   . Sleep apnea    "I'm suppose to wear a mask but I don't" (04/16/2015)  . Varicose veins of right lower extremity   . Venous stasis of lower extremity    right    Family History  Problem Relation Age of Onset  . Kidney disease Mother        kidney transplant    Past Surgical History:  Procedure Laterality Date  . CHOLECYSTECTOMY N/A 04/18/2015   Procedure: LAPAROSCOPIC CHOLECYSTECTOMY;  Surgeon: Ralene Ok, MD;  Location: [facility];  Service: General;  Laterality: N/A;  . HYSTEROSCOPY W/D&C  12/24/2001   Archie Endo 09/28/2010  . I&D EXTREMITY Right 07/25/2016   Procedure: IRRIGATION AND DEBRIDEMENT RIGHT LEG ULCER, APPLY VERAFLO VAC;  Surgeon: [name], MD;  Location: [facility];  Service: Orthopedics;  Laterality: Right;  . INCISE AND DRAIN ABCESS Right 07/14/2016  . INCISION AND DRAINAGE Right 09/10/2008   leg:  skin and soft tissue and muscle/notes 09/15/2010  . INCISION AND DRAINAGE Right 01/01/2008   Chronic venous stasis insufficiency ulcer,/notes 09/14/2010/  . INCISION AND DRAINAGE Right 05/6943   calf w/application wound vac/notes 07/20/2010  . INCISION AND DRAINAGE Right 07/25/2016   IRRIGATION AND DEBRIDEMENT RIGHT LEG ULCER,  . LAPAROSCOPIC GASTRIC BYPASS  ~ 2007  . SKIN GRAFT SPLIT THICKNESS LEG / FOOT Right 07/25/2016   LEG  . SKIN SPLIT GRAFT Right 07/27/2016   Procedure: SKIN GRAFT RIGHT LEG WITH THERASKIN APPLICATION;  Surgeon: [name], MD;  Location: [facility];  Service: Orthopedics;  Laterality: Right;  . TONSILLECTOMY     Social History   Occupational History  . Not on file  Tobacco Use  . Smoking status: Never Smoker  . Smokeless tobacco: Never Used  Substance and Sexual Activity  . Alcohol use: No  . Drug use: No  . Sexual activity: Yes    Partners: Male    Birth control/protection: IUD

## 2018-01-01 ENCOUNTER — Encounter (INDEPENDENT_AMBULATORY_CARE_PROVIDER_SITE_OTHER): Payer: Self-pay | Admitting: Orthopedic Surgery

## 2018-01-02 ENCOUNTER — Encounter (INDEPENDENT_AMBULATORY_CARE_PROVIDER_SITE_OTHER): Payer: Self-pay | Admitting: Orthopedic Surgery

## 2018-01-02 ENCOUNTER — Ambulatory Visit (INDEPENDENT_AMBULATORY_CARE_PROVIDER_SITE_OTHER): Payer: Medicare Other | Admitting: Orthopedic Surgery

## 2018-01-02 NOTE — Progress Notes (Signed)
Office Visit Note   Patient: Alice [name]           Date of Birth: 12/20/1970           MRN: [id number] Visit Date: 12/19/2017              Requested by: Benito Mccreedy, MD [address] [place], [place] [address] PCP: Benito Mccreedy, MD  No chief complaint on file.     HPI: Patient is a 47 year old woman presents in follow-up for chronic venous insufficiency ulceration right lower extremity.  Assessment & Plan: Visit Diagnoses:  1. 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 (HCC)     Plan: Leg was wrapped with compression wrap.  Again discussed nutrition and proper diet.  Discussed the importance of probiotics.  Follow-Up Instructions: Return in about 1 week (around 12/26/2017).   Ortho Exam  Patient is alert, oriented, no adenopathy, well-dressed, normal affect, normal respiratory effort. Examination patient has some wrinkling of the skin there is no redness no cellulitis no signs of infection she has approximately 75% healthy granulation tissue.  Imaging: No results found.   Labs: Lab Results  Component Value Date   REPTSTATUS 01/04/2017 FINAL 12/30/2016   REPTSTATUS 01/04/2017 FINAL 12/30/2016   GRAMSTAIN  05/17/2013    MODERATE WBC PRESENT, PREDOMINANTLY MONONUCLEAR MODERATE GRAM POSITIVE COCCI IN PAIRS IN CHAINS RARE GRAM NEGATIVE RODS Gram Stain Report Called to,Read Back By and Verified With: HOPPER,M RN @ [phone] 05/17/13 LEONARD,A   GRAMSTAIN  05/17/2013    MODERATE WBC PRESENT, PREDOMINANTLY MONONUCLEAR MODERATE SQUAMOUS EPITHELIAL CELLS PRESENT MODERATE GRAM POSITIVE COCCI IN PAIRS IN CHAINS RARE GRAM NEGATIVE RODS Performed at [facility]   CULT NO GROWTH 5 DAYS 12/30/2016   CULT NO GROWTH 5 DAYS 12/30/2016   LABORGA ESCHERICHIA COLI (A) 12/29/2016     Lab Results  Component Value Date   ALBUMIN 2.2 (L) 01/03/2017   ALBUMIN 2.1 (L) 01/02/2017   ALBUMIN 3.3 (L) 07/13/2015     There is no height or weight on file to calculate BMI.  Orders:  No orders of the defined types were placed in this encounter.  No orders of the defined types were placed in this encounter.    Procedures: No procedures performed  Clinical Data: No additional findings.  ROS:  All other systems negative, except as noted in the HPI. Review of Systems  Objective: Vital Signs: There were no vitals taken for this visit.  Specialty Comments:  No specialty comments available.  PMFS History: Patient Active Problem List   Diagnosis Date Noted  . Severe malnutrition ([place]) 08/24/2017  . Acute lower UTI 12/29/2016  . Sepsis ([place]) 12/29/2016  . Acute kidney injury superimposed on CKD ([place]) 12/28/2016  . Atypical chest pain 12/28/2016  . Hx of skin graft 08/01/2016  . 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 ([place]) 07/25/2016  . 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 ([place]) 07/05/2016  . Trichomonal infection 11/24/2015  . Abdominal pain 05/02/2015  . Symptomatic cholelithiasis 04/16/2015  . Acute bilateral upper abdominal pain 04/16/2015  . Microcytic anemia 05/18/2013  . Hypotension, unspecified 05/17/2013  . CKD (chronic kidney disease), stage III ([place]) 05/17/2013  . Hypokalemia 05/17/2013  . Anal fissure 02/06/2013  . Anal skin tag 02/06/2013  . ALLERGIC RHINITIS 03/05/2010  . LOW BACK PAIN SYNDROME 12/13/2007  . WOUND INFECTION 10/10/2007  . OBESITY, MORBID 12/02/2006  .  HTN (hypertension) 12/02/2006  . History of pulmonary embolism 12/02/2006  . History of DVT (deep vein thrombosis) 12/02/2006  . 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 GASTROESOPHAGEAL REFLUX DISEASE 12/02/2006  . OSA (obstructive sleep apnea) 12/02/2006   Past Medical History:  Diagnosis Date  . Anemia   . Anxiety   . Chronic bronchitis ([place])   . Chronic upper back pain   . Depression   . DVT (deep venous  thrombosis) (HCC)    BLE  . GERD (gastroesophageal reflux disease)   . Headache    "weekly" (04/16/2015)  . Hypertension   . Migraine    "monthly" (04/16/2015)  . Obesity   . Pneumonia 2014  . Pulmonary embolism ([place])   . Sinusitis nasal   . Sleep apnea    "I'm suppose to wear a mask but I don't" (04/16/2015)  . Varicose veins of right lower extremity   . Venous stasis of lower extremity    right    Family History  Problem Relation Age of Onset  . Kidney disease Mother        kidney transplant    Past Surgical History:  Procedure Laterality Date  . CHOLECYSTECTOMY N/A 04/18/2015   Procedure: LAPAROSCOPIC CHOLECYSTECTOMY;  Surgeon: Ralene Ok, MD;  Location: [facility];  Service: General;  Laterality: N/A;  . HYSTEROSCOPY W/D&C  12/24/2001   Archie Endo 09/28/2010  . I&D EXTREMITY Right 07/25/2016   Procedure: IRRIGATION AND DEBRIDEMENT RIGHT LEG ULCER, APPLY VERAFLO VAC;  Surgeon: [name], MD;  Location: [facility];  Service: Orthopedics;  Laterality: Right;  . INCISE AND DRAIN ABCESS Right 07/14/2016  . INCISION AND DRAINAGE Right 09/10/2008   leg:  skin and soft tissue and muscle/notes 09/15/2010  . INCISION AND DRAINAGE Right 01/01/2008   Chronic venous stasis insufficiency ulcer,/notes 09/14/2010/  . INCISION AND DRAINAGE Right 01/9773   calf w/application wound vac/notes 07/20/2010  . INCISION AND DRAINAGE Right 07/25/2016   IRRIGATION AND DEBRIDEMENT RIGHT LEG ULCER,  . LAPAROSCOPIC GASTRIC BYPASS  ~ 2007  . SKIN GRAFT SPLIT THICKNESS LEG / FOOT Right 07/25/2016   LEG  . SKIN SPLIT GRAFT Right 07/27/2016   Procedure: SKIN GRAFT RIGHT LEG WITH THERASKIN APPLICATION;  Surgeon: [name], MD;  Location: [facility];  Service: Orthopedics;  Laterality: Right;  . TONSILLECTOMY     Social History   Occupational History  . Not on file  Tobacco Use  . Smoking status: Never Smoker  . Smokeless tobacco: Never Used  Substance and Sexual Activity  . Alcohol use: No  . Drug use: No  .  Sexual activity: Yes    Partners: Male    Birth control/protection: IUD

## 2018-01-08 ENCOUNTER — Encounter: Payer: Self-pay | Admitting: Oncology

## 2018-01-08 ENCOUNTER — Telehealth: Payer: Self-pay | Admitting: Oncology

## 2018-01-08 NOTE — Telephone Encounter (Signed)
New referral received from Dr. Vista Lawman for anemia. Pt has been called and scheduled to see Alice [name] on 9/16 at 1pm. Pt aware to arrive 30 minutes early. Letter mailed.

## 2018-01-09 ENCOUNTER — Ambulatory Visit (INDEPENDENT_AMBULATORY_CARE_PROVIDER_SITE_OTHER): Payer: Medicare Other | Admitting: *Deleted

## 2018-01-09 ENCOUNTER — Encounter (INDEPENDENT_AMBULATORY_CARE_PROVIDER_SITE_OTHER): Payer: Self-pay

## 2018-01-09 DIAGNOSIS — Z5181 Encounter for therapeutic drug level monitoring: Secondary | ICD-10-CM | POA: Insufficient documentation

## 2018-01-09 DIAGNOSIS — I2699 Other pulmonary embolism without acute cor pulmonale: Secondary | ICD-10-CM | POA: Insufficient documentation

## 2018-01-09 DIAGNOSIS — Z86718 Personal history of other venous thrombosis and embolism: Secondary | ICD-10-CM | POA: Diagnosis not present

## 2018-01-09 LAB — POCT INR: INR: 1.2 — AB (ref 2.0–3.0)

## 2018-01-09 NOTE — Patient Instructions (Addendum)
A full discussion of the nature of anticoagulants has been carried out.  A benefit risk analysis has been presented to the patient, so that they understand the justification for choosing anticoagulation at this time. The need for frequent and regular monitoring, precise dosage adjustment and compliance is stressed.  Side effects of potential bleeding are discussed.  The patient should avoid any OTC items containing aspirin or ibuprofen, and should avoid great swings in general diet.  Avoid alcohol consumption.  Call if any signs of abnormal bleeding.   Description   Today and tomorrow take 2.5 tablets then resume taking 2 tablets daily. Recheck INR in 1 week.  [facility] [phone] Main [phone]

## 2018-01-16 ENCOUNTER — Ambulatory Visit (INDEPENDENT_AMBULATORY_CARE_PROVIDER_SITE_OTHER): Payer: Medicare Other | Admitting: *Deleted

## 2018-01-16 DIAGNOSIS — Z86718 Personal history of other venous thrombosis and embolism: Secondary | ICD-10-CM

## 2018-01-16 DIAGNOSIS — Z5181 Encounter for therapeutic drug level monitoring: Secondary | ICD-10-CM | POA: Diagnosis not present

## 2018-01-16 LAB — POCT INR: INR: 1.2 — AB (ref 2.0–3.0)

## 2018-01-16 NOTE — Patient Instructions (Signed)
Description   Today and tomorrow take 2.5 tablets then start taking 2 tablets daily except 2.5 tablets Monday, Wednesday, and Friday.   Recheck INR in 1 week.  [facility] [phone] Main [phone]

## 2018-01-18 DIAGNOSIS — Z7901 Long term (current) use of anticoagulants: Secondary | ICD-10-CM | POA: Diagnosis not present

## 2018-01-18 DIAGNOSIS — F329 Major depressive disorder, single episode, unspecified: Secondary | ICD-10-CM | POA: Diagnosis not present

## 2018-01-18 DIAGNOSIS — G4733 Obstructive sleep apnea (adult) (pediatric): Secondary | ICD-10-CM | POA: Diagnosis not present

## 2018-01-18 DIAGNOSIS — R1013 Epigastric pain: Secondary | ICD-10-CM | POA: Diagnosis not present

## 2018-01-18 DIAGNOSIS — R7303 Prediabetes: Secondary | ICD-10-CM | POA: Diagnosis not present

## 2018-01-18 DIAGNOSIS — E559 Vitamin D deficiency, unspecified: Secondary | ICD-10-CM | POA: Diagnosis not present

## 2018-01-18 DIAGNOSIS — G629 Polyneuropathy, unspecified: Secondary | ICD-10-CM | POA: Diagnosis not present

## 2018-01-18 DIAGNOSIS — I10 Essential (primary) hypertension: Secondary | ICD-10-CM | POA: Diagnosis not present

## 2018-01-24 ENCOUNTER — Ambulatory Visit (INDEPENDENT_AMBULATORY_CARE_PROVIDER_SITE_OTHER): Payer: Medicare Other | Admitting: *Deleted

## 2018-01-24 DIAGNOSIS — Z5181 Encounter for therapeutic drug level monitoring: Secondary | ICD-10-CM | POA: Diagnosis not present

## 2018-01-24 DIAGNOSIS — Z86718 Personal history of other venous thrombosis and embolism: Secondary | ICD-10-CM

## 2018-01-24 LAB — POCT INR: INR: 1.3 — AB (ref 2.0–3.0)

## 2018-01-24 NOTE — Patient Instructions (Signed)
Description   Start taking 3 tablets daily except 2 tablets on Tuesdays and Saturdays. Recheck INR in 1 week.  [facility] [phone] Main [phone]

## 2018-01-29 ENCOUNTER — Other Ambulatory Visit: Payer: Self-pay

## 2018-01-29 ENCOUNTER — Inpatient Hospital Stay: Payer: Medicare Other

## 2018-01-29 ENCOUNTER — Encounter: Payer: Self-pay | Admitting: Oncology

## 2018-01-29 ENCOUNTER — Telehealth: Payer: Self-pay | Admitting: Oncology

## 2018-01-29 ENCOUNTER — Inpatient Hospital Stay: Payer: Medicare Other | Attending: Oncology | Admitting: Oncology

## 2018-01-29 VITALS — BP 146/84 | HR 97 | Temp 97.8°F | Resp 18 | Ht 66.0 in | Wt 329.7 lb

## 2018-01-29 DIAGNOSIS — I10 Essential (primary) hypertension: Secondary | ICD-10-CM | POA: Diagnosis not present

## 2018-01-29 DIAGNOSIS — D509 Iron deficiency anemia, unspecified: Secondary | ICD-10-CM | POA: Diagnosis not present

## 2018-01-29 DIAGNOSIS — E559 Vitamin D deficiency, unspecified: Secondary | ICD-10-CM | POA: Insufficient documentation

## 2018-01-29 DIAGNOSIS — Z86718 Personal history of other venous thrombosis and embolism: Secondary | ICD-10-CM | POA: Diagnosis not present

## 2018-01-29 DIAGNOSIS — E43 Unspecified severe protein-calorie malnutrition: Secondary | ICD-10-CM

## 2018-01-29 DIAGNOSIS — Z86711 Personal history of pulmonary embolism: Secondary | ICD-10-CM | POA: Diagnosis not present

## 2018-01-29 DIAGNOSIS — F329 Major depressive disorder, single episode, unspecified: Secondary | ICD-10-CM

## 2018-01-29 LAB — CBC WITH DIFFERENTIAL (CANCER CENTER ONLY)
BASOS ABS: 0.1 10*3/uL (ref 0.0–0.1)
BASOS PCT: 1 %
Eosinophils Absolute: 0.3 10*3/uL (ref 0.0–0.5)
Eosinophils Relative: 4 %
HEMATOCRIT: 30.7 % — AB (ref 34.8–46.6)
HEMOGLOBIN: 9.6 g/dL — AB (ref 11.6–15.9)
Lymphocytes Relative: 27 %
Lymphs Abs: 1.8 10*3/uL (ref 0.9–3.3)
MCH: 21.5 pg — AB (ref 25.1–34.0)
MCHC: 31.3 g/dL — AB (ref 31.5–36.0)
MCV: 68.8 fL — AB (ref 79.5–101.0)
Monocytes Absolute: 0.6 10*3/uL (ref 0.1–0.9)
Monocytes Relative: 9 %
NEUTROS ABS: 4 10*3/uL (ref 1.5–6.5)
NEUTROS PCT: 59 %
Platelet Count: 301 10*3/uL (ref 145–400)
RBC: 4.46 MIL/uL (ref 3.70–5.45)
RDW: 16.4 % — ABNORMAL HIGH (ref 11.2–14.5)
WBC Count: 6.7 10*3/uL (ref 3.9–10.3)

## 2018-01-29 LAB — CMP (CANCER CENTER ONLY)
ALK PHOS: 114 U/L (ref 38–126)
ALT: 11 U/L (ref 0–44)
ANION GAP: 8 (ref 5–15)
AST: 26 U/L (ref 15–41)
Albumin: 3.2 g/dL — ABNORMAL LOW (ref 3.5–5.0)
BILIRUBIN TOTAL: 0.3 mg/dL (ref 0.3–1.2)
BUN: 24 mg/dL — ABNORMAL HIGH (ref 6–20)
CALCIUM: 9 mg/dL (ref 8.9–10.3)
CO2: 30 mmol/L (ref 22–32)
Chloride: 103 mmol/L (ref 98–111)
Creatinine: 1.32 mg/dL — ABNORMAL HIGH (ref 0.44–1.00)
GFR, EST AFRICAN AMERICAN: 55 mL/min — AB (ref >60)
GFR, Estimated: 48 mL/min — ABNORMAL LOW (ref >60)
Glucose, Bld: 87 mg/dL (ref 70–99)
POTASSIUM: 3.4 mmol/L — AB (ref 3.5–5.1)
SODIUM: 141 mmol/L (ref 135–145)
TOTAL PROTEIN: 7.8 g/dL (ref 6.5–8.1)

## 2018-01-29 LAB — IRON AND TIBC
Iron: 18 ug/dL — ABNORMAL LOW (ref 41–142)
SATURATION RATIOS: 4 % — AB (ref 21–57)
TIBC: 419 ug/dL (ref 236–444)
UIBC: 401 ug/dL

## 2018-01-29 LAB — FOLATE: FOLATE: 7.8 ng/mL (ref >5.9)

## 2018-01-29 LAB — FERRITIN: Ferritin: <4 ng/mL — ABNORMAL LOW (ref 11–307)

## 2018-01-29 LAB — VITAMIN B12: VITAMIN B 12: 194 pg/mL (ref 180–914)

## 2018-01-29 NOTE — Progress Notes (Signed)
[facility] Telephone:(336) [phone]   Fax:(336) [phone]            CONSULT NOTE  REFERRING PHYSICIAN: Dr. Iona Beard Osei-Bonsu   REASON FOR CONSULTATION: Anemia  HPI: Alice [name] is a 47 y.o. female with a past medical history including hypertension, DVT, pulmonary embolism, morbid obesity, obstructive sleep apnea, vitamin D deficiency, renal insufficiency, depression, and history of gastric bypass.  The patient is seen at the request of Dr. Vista Lawman for anemia.  The patient reports that she is been anemic for at least the past 6 years when she had gastric bypass surgery in 2013.  She reports that she has taken oral iron in the past but has not been taking this recently.  She is unsure if the oral iron was helpful for her anemia.  She does report a history of blood transfusions.  Most recent one was in March 2019 per her report.  Reviewed the patient's labs from December 21, 2017 shows a hemoglobin of 9.6 with an MCV of 70 and a decreased MCHC.  Her iron percent saturation and iron level were low.  A ferritin level is not available to me. Today, the patient reports fatigue.  She also reports having shortness of breath with exertion and intermittent dizziness.  She denies fevers and chills.  Denies chest pain, shortness of breath at rest, cough, hemoptysis.  She reports intermittent nausea but no vomiting.  She also reports constipation.  Denies diarrhea.  She denies epistaxis, bleeding gums, hematuria, melena.  She reports that she has a Mirena IUD in place and does not have regular periods. Her family history is significant for a mother who died at age 44 with hypertension and end-stage renal disease requiring dialysis.  She also has a history of cancer but the patient does not know the primary.  Her father is living and has hypertension and is currently on dialysis.  She had a brother who died at age 78 due to end-stage renal disease. The patient is single.  She has no children.  She  is currently on disability.  Denies alcohol, tobacco, and illicit drug use.   Past Medical History:  Diagnosis Date  . Anemia   . Anxiety   . Chronic bronchitis ([place])   . Chronic upper back pain   . Depression   . DVT (deep venous thrombosis) (HCC)    BLE  . GERD (gastroesophageal reflux disease)   . Headache    "weekly" (04/16/2015)  . Hypertension   . Migraine    "monthly" (04/16/2015)  . Obesity   . Pneumonia 2014  . Pulmonary embolism ([place])   . Sinusitis nasal   . Sleep apnea    "I'm suppose to wear a mask but I don't" (04/16/2015)  . Varicose veins of right lower extremity   . Venous stasis of lower extremity    right  :   Past Surgical History:  Procedure Laterality Date  . CHOLECYSTECTOMY N/A 04/18/2015   Procedure: LAPAROSCOPIC CHOLECYSTECTOMY;  Surgeon: Ralene Ok, MD;  Location: [facility];  Service: General;  Laterality: N/A;  . HYSTEROSCOPY W/D&C  12/24/2001   Archie Endo 09/28/2010  . I&D EXTREMITY Right 07/25/2016   Procedure: IRRIGATION AND DEBRIDEMENT RIGHT LEG ULCER, APPLY VERAFLO VAC;  Surgeon: [name], MD;  Location: [facility];  Service: Orthopedics;  Laterality: Right;  . INCISE AND DRAIN ABCESS Right 07/14/2016  . INCISION AND DRAINAGE Right 09/10/2008   leg:  skin and soft tissue and muscle/notes  09/15/2010  . INCISION AND DRAINAGE Right 01/01/2008   Chronic venous stasis insufficiency ulcer,/notes 09/14/2010/  . INCISION AND DRAINAGE Right 09/2839   calf w/application wound vac/notes 07/20/2010  . INCISION AND DRAINAGE Right 07/25/2016   IRRIGATION AND DEBRIDEMENT RIGHT LEG ULCER,  . LAPAROSCOPIC GASTRIC BYPASS  ~ 2007  . SKIN GRAFT SPLIT THICKNESS LEG / FOOT Right 07/25/2016   LEG  . SKIN SPLIT GRAFT Right 07/27/2016   Procedure: SKIN GRAFT RIGHT LEG WITH THERASKIN APPLICATION;  Surgeon: [name], MD;  Location: [facility];  Service: Orthopedics;  Laterality: Right;  . TONSILLECTOMY    :   CURRENT MEDS: Current Outpatient Medications  Medication Sig  Dispense Refill  . amLODipine (NORVASC) 10 MG tablet Take 1 tablet (10 mg total) by mouth daily. 31 tablet 0  . DULoxetine (CYMBALTA) 20 MG capsule Take 1 capsule by mouth 2 (two) times daily.  0  . hydrochlorothiazide (HYDRODIURIL) 25 MG tablet Take 25 mg by mouth daily.    . pantoprazole (PROTONIX) 40 MG tablet Take 1 tablet (40 mg total) by mouth daily. For acid reflux 30 tablet 0  . traMADol (ULTRAM) 50 MG tablet Take 1 tablet (50 mg total) by mouth every 6 (six) hours as needed for moderate pain. 30 tablet 0  . warfarin (COUMADIN) 2.5 MG tablet Take 5 mg by mouth daily. Take 2 tablets daily or as directed by Anticoagulation Clinic    . albuterol (PROVENTIL HFA;VENTOLIN HFA) 108 (90 Base) MCG/ACT inhaler Inhale 2-3 puffs into the lungs every 6 (six) hours as needed for wheezing or shortness of breath.     . nitroGLYCERIN (NITROSTAT) 0.4 MG SL tablet Place 1 tablet (0.4 mg total) under the tongue every 5 (five) minutes x 3 doses as needed for chest pain. (Patient not taking: Reported on 01/29/2018) 30 tablet 4   No current facility-administered medications for this visit.     Allergies  Allergen Reactions  . Oxycodone Hcl Nausea Only  . Vicodin [Hydrocodone-Acetaminophen] Nausea And Vomiting  :  Family History  Problem Relation Age of Onset  . Kidney disease Mother        kidney transplant  :  Social History   Socioeconomic History  . Marital status: Single    Spouse name: Not on file  . Number of children: Not on file  . Years of education: Not on file  . Highest education level: Not on file  Occupational History  . Not on file  Social Needs  . Financial resource strain: Not on file  . Food insecurity:    Worry: Not on file    Inability: Not on file  . Transportation needs:    Medical: Not on file    Non-medical: Not on file  Tobacco Use  . Smoking status: Never Smoker  . Smokeless tobacco: Never Used  Substance and Sexual Activity  . Alcohol use: No  . Drug use: No   . Sexual activity: Yes    Partners: Male    Birth control/protection: IUD  Lifestyle  . Physical activity:    Days per week: Not on file    Minutes per session: Not on file  . Stress: Not on file  Relationships  . Social connections:    Talks on phone: Not on file    Gets together: Not on file    Attends religious service: Not on file    Active member of club or organization: Not on file    Attends meetings of clubs  or organizations: Not on file    Relationship status: Not on file  . Intimate partner violence:    Fear of current or ex partner: Not on file    Emotionally abused: Not on file    Physically abused: Not on file    Forced sexual activity: Not on file  Other Topics Concern  . Not on file  Social History Narrative  . Not on file  :  REVIEW OF SYSTEMS:   Constitutional: Negative for appetite change, chills, fever and unexpected weight change.  Positive for fatigue. HENT:   Negative for mouth sores, nosebleeds, sore throat and trouble swallowing.   Eyes: Negative for eye problems and icterus.  Respiratory: Negative for cough, hemoptysis, shortness of breath at rest and wheezing.  Positive for shortness of breath with exertion.  Cardiovascular: Negative for chest pain and leg swelling.  Gastrointestinal: Negative for abdominal pain, diarrhea, and vomiting.  Positive for nausea and constipation. Genitourinary: Negative for bladder incontinence, difficulty urinating, dysuria, frequency and hematuria.   Musculoskeletal: Negative for gait problem, neck pain and neck stiffness.  Positive for back pain. Skin: Negative for itching and rash.  Neurological: Negative for dizziness, extremity weakness, gait problem, headaches, light-headedness and seizures.  Hematological: Negative for adenopathy. Does not bruise/bleed easily.  Psychiatric/Behavioral: Negative for confusion, depression and sleep disturbance. The patient is not nervous/anxious.     PHYSICAL EXAMINATION: Blood  pressure (!) 146/84, pulse 97, temperature 97.8 F (36.6 C), temperature source Oral, resp. rate 18, height 5\' 6"  (1.676 m), weight (!) 329 lb 11.2 oz (149.6 kg), SpO2 100 %.  ECOG PERFORMANCE STATUS: 1 - Symptomatic but completely ambulatory  Physical Exam  Constitutional: Oriented to person, place, and time and well-developed, well-nourished, and in no distress. No distress.  HENT:  Head: Normocephalic and atraumatic.  Mouth/Throat: Oropharynx is clear and moist. No oropharyngeal exudate.  Eyes: Conjunctivae are normal. Right eye exhibits no discharge. Left eye exhibits no discharge. No scleral icterus.  Neck: Normal range of motion. Neck supple.  Cardiovascular: Normal rate, regular rhythm, normal heart sounds and intact distal pulses.   Pulmonary/Chest: Effort normal and breath sounds normal. No respiratory distress. No wheezes. No rales.  Abdominal: Soft. Bowel sounds are normal. Exhibits no distension and no mass. There is no tenderness.  Musculoskeletal: Normal range of motion. Exhibits no edema.  Lymphadenopathy:    No cervical adenopathy.  Neurological: Alert and oriented to person, place, and time. Exhibits normal muscle tone. Gait normal. Coordination normal.  Skin: Skin is warm and dry. No rash noted. Not diaphoretic. No erythema. No pallor.  Psychiatric: Mood, memory and judgment normal.  Vitals reviewed.    LABS:  Lab Results  Component Value Date   WBC 7.7 09/05/2017   HGB 9.5 (L) 09/05/2017   HCT 30.1 (L) 09/05/2017   PLT 337 09/05/2017   GLUCOSE 79 09/05/2017   CHOL 153 09/14/2007   TRIG 46 09/14/2007   HDL 52 09/14/2007   LDLCALC 92 09/14/2007   ALT 18 07/13/2015   AST 30 07/13/2015   NA 137 09/05/2017   K 3.8 09/05/2017   CL 104 09/05/2017   CREATININE 1.33 (H) 09/05/2017   BUN 22 (H) 09/05/2017   CO2 24 09/05/2017   INR 1.3 (A) 01/24/2018    No results found.  ASSESSMENT: This is a pleasant 47 year old African-American female with microcytic,  hypochromic anemia.  This is likely due to iron deficiency.  The patient has a history of gastric bypass which may limit her  ability to absorb iron.  PLAN: The patient was seen with Dr. Earlie Server.  Will obtain repeat lab work today including a CBC, CMET, ferritin, iron studies, vitamin B12 level, and folate level.  We discussed proceeding with Feraheme 510 mg IV x2 doses.  Adverse effects of Feraheme were discussed with the patient including but not limited to an infusion reaction.  The patient is agreeable to proceed.  Anticipate first dose next week. The patient will follow-up in 2 months for evaluation and repeat lab work.  She was advised to call immediately if she has any concerning symptoms in the interval. The patient voices understanding of current disease status and treatment options and is in agreement with the current care plan.   All questions were answered. The patient knows to call the clinic with any problems, questions or concerns. We can certainly see the patient much sooner if necessary.   Thank you so much for allowing me to participate in the care of Alice [name]. I will continue to follow up the patient with you and assist in her care.  Orders Placed This Encounter  Procedures  . CBC with Differential (Cancer Center Only)    Standing Status:   Future    Number of Occurrences:   1    Standing Expiration Date:   01/30/2019  . CMP ([facility] only)    Standing Status:   Future    Number of Occurrences:   1    Standing Expiration Date:   01/30/2019  . Ferritin    Standing Status:   Future    Number of Occurrences:   1    Standing Expiration Date:   01/30/2019  . Iron and TIBC    Standing Status:   Future    Number of Occurrences:   1    Standing Expiration Date:   01/30/2019  . Vitamin B12    Standing Status:   Future    Number of Occurrences:   1    Standing Expiration Date:   01/29/2019  . Folate, Serum    Standing Status:   Future    Number of Occurrences:   1     Standing Expiration Date:   01/29/2019    Mikey Bussing, DNP, AGPCNP-BC, AOCNP  ADDENDUM: Hematology/Oncology Attending: I had a face-to-face encounter with the patient today.  I recommended her care plan.  This is a very pleasant 47 years old African-American female presented for evaluation of microcytic hypochromic anemia secondary to iron deficiency likely from insufficient iron absorption secondary to gastric bypass surgery. The patient has been treated in the past with oral iron tablet but she has intolerance to this treatment with stomach upset and constipation and she was not taking it at regular basis. I order several studies today for confirmation of her anemia including repeat CBC, iron study, ferritin, serum folate, and vitamin B12.  Her ferritin level today was less than 4 with serum iron of 18 and iron saturation of 4% I will arrange for the patient to receive Feraheme infusion 510 mg IV weekly for 2 doses.  We will see her back for follow-up visit in 2 months for evaluation with repeat CBC, iron study and ferritin. The patient was advised to call immediately if she has any concerning symptoms in the interval.  Disclaimer: This note was dictated with voice recognition software. Similar sounding words can inadvertently be transcribed and may be missed upon review. Eilleen Kempf, MD 01/29/18

## 2018-01-29 NOTE — Patient Instructions (Signed)

## 2018-01-29 NOTE — Telephone Encounter (Signed)
Appts scheduled avs/calendar printed per 9/16 los

## 2018-01-31 ENCOUNTER — Other Ambulatory Visit: Payer: Self-pay | Admitting: Cardiology

## 2018-01-31 ENCOUNTER — Ambulatory Visit (INDEPENDENT_AMBULATORY_CARE_PROVIDER_SITE_OTHER): Payer: Medicare Other | Admitting: Pharmacist

## 2018-01-31 DIAGNOSIS — Z86718 Personal history of other venous thrombosis and embolism: Secondary | ICD-10-CM

## 2018-01-31 DIAGNOSIS — Z5181 Encounter for therapeutic drug level monitoring: Secondary | ICD-10-CM

## 2018-01-31 LAB — POCT INR: INR: 1.5 — AB (ref 2.0–3.0)

## 2018-01-31 MED ORDER — WARFARIN SODIUM 2.5 MG PO TABS: ORAL_TABLET | ORAL | 1 refills | Status: DC

## 2018-01-31 NOTE — Patient Instructions (Signed)
Description   Take 4 tablets today then start taking 3 tablets daily. Recheck INR in 1 week.  [facility] [phone] Main [phone]

## 2018-01-31 NOTE — Telephone Encounter (Signed)
Warfarin refill, please address thank you.

## 2018-02-05 ENCOUNTER — Inpatient Hospital Stay: Payer: Medicare Other

## 2018-02-05 VITALS — BP 145/81 | HR 85 | Temp 98.7°F | Resp 17

## 2018-02-05 DIAGNOSIS — E559 Vitamin D deficiency, unspecified: Secondary | ICD-10-CM | POA: Diagnosis not present

## 2018-02-05 DIAGNOSIS — I10 Essential (primary) hypertension: Secondary | ICD-10-CM | POA: Diagnosis not present

## 2018-02-05 DIAGNOSIS — F329 Major depressive disorder, single episode, unspecified: Secondary | ICD-10-CM | POA: Diagnosis not present

## 2018-02-05 DIAGNOSIS — Z86718 Personal history of other venous thrombosis and embolism: Secondary | ICD-10-CM | POA: Diagnosis not present

## 2018-02-05 DIAGNOSIS — D509 Iron deficiency anemia, unspecified: Secondary | ICD-10-CM

## 2018-02-05 MED ORDER — SODIUM CHLORIDE 0.9 % IV SOLN
Freq: Once | INTRAVENOUS | Status: AC
  Administered 2018-02-05: 16:00:00 via INTRAVENOUS
  Filled 2018-02-05: qty 250

## 2018-02-05 MED ORDER — SODIUM CHLORIDE 0.9 % IV SOLN
510.0000 mg | Freq: Once | INTRAVENOUS | Status: AC
  Administered 2018-02-05: 510 mg via INTRAVENOUS
  Filled 2018-02-05: qty 17

## 2018-02-05 NOTE — Patient Instructions (Signed)

## 2018-02-06 ENCOUNTER — Encounter (INDEPENDENT_AMBULATORY_CARE_PROVIDER_SITE_OTHER): Payer: Self-pay | Admitting: Orthopedic Surgery

## 2018-02-06 ENCOUNTER — Ambulatory Visit (INDEPENDENT_AMBULATORY_CARE_PROVIDER_SITE_OTHER): Payer: Medicare Other | Admitting: Orthopedic Surgery

## 2018-02-06 VITALS — Ht 66.0 in | Wt 329.7 lb

## 2018-02-06 DIAGNOSIS — L97919 Non-pressure chronic ulcer of unspecified part of right lower leg with unspecified severity: Secondary | ICD-10-CM | POA: Diagnosis not present

## 2018-02-06 DIAGNOSIS — I87331 Chronic venous hypertension (idiopathic) with ulcer and inflammation of right lower extremity: Secondary | ICD-10-CM | POA: Diagnosis not present

## 2018-02-07 ENCOUNTER — Ambulatory Visit (INDEPENDENT_AMBULATORY_CARE_PROVIDER_SITE_OTHER): Payer: Medicare Other | Admitting: *Deleted

## 2018-02-07 DIAGNOSIS — Z5181 Encounter for therapeutic drug level monitoring: Secondary | ICD-10-CM | POA: Diagnosis not present

## 2018-02-07 DIAGNOSIS — Z86718 Personal history of other venous thrombosis and embolism: Secondary | ICD-10-CM | POA: Diagnosis not present

## 2018-02-07 LAB — POCT INR: INR: 1.8 — AB (ref 2.0–3.0)

## 2018-02-07 NOTE — Patient Instructions (Addendum)
Description   Take 4 tablets today then start taking 3 tablets daily except 4 tablets on Sundays.  Recheck INR in 1 week. [facility] [phone] Main [phone]

## 2018-02-12 ENCOUNTER — Inpatient Hospital Stay: Payer: Medicare Other

## 2018-02-13 ENCOUNTER — Ambulatory Visit (INDEPENDENT_AMBULATORY_CARE_PROVIDER_SITE_OTHER): Payer: Medicare Other | Admitting: Physician Assistant

## 2018-02-13 ENCOUNTER — Encounter (INDEPENDENT_AMBULATORY_CARE_PROVIDER_SITE_OTHER): Payer: Self-pay | Admitting: Orthopedic Surgery

## 2018-02-13 ENCOUNTER — Ambulatory Visit (INDEPENDENT_AMBULATORY_CARE_PROVIDER_SITE_OTHER): Payer: Medicare Other

## 2018-02-13 ENCOUNTER — Other Ambulatory Visit (INDEPENDENT_AMBULATORY_CARE_PROVIDER_SITE_OTHER): Payer: Self-pay | Admitting: Physician Assistant

## 2018-02-13 ENCOUNTER — Encounter (INDEPENDENT_AMBULATORY_CARE_PROVIDER_SITE_OTHER): Payer: Self-pay

## 2018-02-13 VITALS — Ht 66.0 in | Wt 329.0 lb

## 2018-02-13 DIAGNOSIS — L97919 Non-pressure chronic ulcer of unspecified part of right lower leg with unspecified severity: Secondary | ICD-10-CM | POA: Diagnosis not present

## 2018-02-13 DIAGNOSIS — E440 Moderate protein-calorie malnutrition: Secondary | ICD-10-CM

## 2018-02-13 DIAGNOSIS — I87331 Chronic venous hypertension (idiopathic) with ulcer and inflammation of right lower extremity: Secondary | ICD-10-CM

## 2018-02-13 DIAGNOSIS — Z6841 Body Mass Index (BMI) 40.0 and over, adult: Secondary | ICD-10-CM

## 2018-02-13 MED ORDER — PENTOXIFYLLINE ER 400 MG PO TBCR: 400.0000 mg | EXTENDED_RELEASE_TABLET | Freq: Three times a day (TID) | ORAL | 1 refills | Status: DC

## 2018-02-13 MED ORDER — TRAMADOL HCL 50 MG PO TABS: 50.0000 mg | ORAL_TABLET | Freq: Four times a day (QID) | ORAL | 0 refills | Status: DC | PRN

## 2018-02-13 NOTE — Progress Notes (Signed)
Office Visit Note   Patient: Alice [name]           Date of Birth: 06-Jun-1970           MRN: [id number] Visit Date: 02/06/2018              Requested by: Benito Mccreedy, MD [facility] [address] [place], [place] [address] PCP: Benito Mccreedy, MD  Chief Complaint  Patient presents with  . Right Leg - Follow-up      HPI: Patient presents in follow-up for chronic venous ulcer right lower extremity.  Patient states the probiotic caused her leg to burn.  She also has burning from silver alginate dressings.  Patient has felt comfortable with using the silver will dressing.  Assessment & Plan: Visit Diagnoses:  1. 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  She will continue with compression wraps at home continue with protein nutrition supplement follow-up in 3 weeks  Follow-Up Instructions: Return in about 3 weeks (around 02/27/2018).   Ortho Exam  Patient is alert, oriented, no adenopathy, well-dressed, normal affect, normal respiratory effort. Examination patient is wound has good granulation tissue there is no significant improvement but there is no odor no drainage no cellulitis no signs of infection.  The wound is about a millimeter deep.  Patient's nutritional status has improved with an albumin going from 2.1-3.2.  Imaging: No results found. No images are attached to the encounter.  Labs: Lab Results  Component Value Date   REPTSTATUS 01/04/2017 FINAL 12/30/2016   REPTSTATUS 01/04/2017 FINAL 12/30/2016   GRAMSTAIN  05/17/2013    MODERATE WBC PRESENT, PREDOMINANTLY MONONUCLEAR MODERATE GRAM POSITIVE COCCI IN PAIRS IN CHAINS RARE GRAM NEGATIVE RODS Gram Stain Report Called to,Read Back By and Verified With: HOPPER,M RN @ [phone] 05/17/13 LEONARD,A   GRAMSTAIN  05/17/2013    MODERATE WBC PRESENT, PREDOMINANTLY MONONUCLEAR MODERATE SQUAMOUS  EPITHELIAL CELLS PRESENT MODERATE GRAM POSITIVE COCCI IN PAIRS IN CHAINS RARE GRAM NEGATIVE RODS Performed at [facility]   CULT NO GROWTH 5 DAYS 12/30/2016   CULT NO GROWTH 5 DAYS 12/30/2016   LABORGA ESCHERICHIA COLI (A) 12/29/2016     Lab Results  Component Value Date   ALBUMIN 3.2 (L) 01/29/2018   ALBUMIN 2.2 (L) 01/03/2017   ALBUMIN 2.1 (L) 01/02/2017    Body mass index is 53.21 kg/m.  Orders:  No orders of the defined types were placed in this encounter.  No orders of the defined types were placed in this encounter.    Procedures: No procedures performed  Clinical Data: No additional findings.  ROS:  All other systems negative, except as noted in the HPI. Review of Systems  Objective: Vital Signs: Ht 5\' 6"  (1.676 m)   Wt (!) 329 lb 11.2 oz (149.6 kg)   BMI 53.21 kg/m   Specialty Comments:  No specialty comments available.  PMFS History: Patient Active Problem List   Diagnosis Date Noted  . Iron deficiency anemia 01/29/2018  . Pulmonary embolus ([place]) 01/09/2018  . Encounter for therapeutic drug monitoring 01/09/2018  . Severe malnutrition ([place]) 08/24/2017  . Acute lower UTI 12/29/2016  . Sepsis ([place]) 12/29/2016  . Acute kidney injury superimposed on CKD ([place]) 12/28/2016  . Atypical chest pain 12/28/2016  . Hx of skin graft 08/01/2016  . 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 ([place]) 07/25/2016  . Idiopathic chronic  venous 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 ([place]) 07/05/2016  . Trichomonal infection 11/24/2015  . Abdominal pain 05/02/2015  . Symptomatic cholelithiasis 04/16/2015  . Acute bilateral upper abdominal pain 04/16/2015  . Microcytic anemia 05/18/2013  . Hypotension, unspecified 05/17/2013  . CKD (chronic kidney disease), stage III ([place]) 05/17/2013  . Hypokalemia 05/17/2013  . Anal fissure 02/06/2013  . Anal skin tag 02/06/2013  . ALLERGIC RHINITIS 03/05/2010  . LOW BACK PAIN  SYNDROME 12/13/2007  . WOUND INFECTION 10/10/2007  . OBESITY, MORBID 12/02/2006  . HTN (hypertension) 12/02/2006  . History of pulmonary embolism 12/02/2006  . History of DVT (deep vein thrombosis) 12/02/2006  . 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 GASTROESOPHAGEAL REFLUX DISEASE 12/02/2006  . OSA (obstructive sleep apnea) 12/02/2006   Past Medical History:  Diagnosis Date  . Anemia   . Anxiety   . Chronic bronchitis ([place])   . Chronic upper back pain   . Depression   . DVT (deep venous thrombosis) (HCC)    BLE  . GERD (gastroesophageal reflux disease)   . Headache    "weekly" (04/16/2015)  . Hypertension   . Migraine    "monthly" (04/16/2015)  . Obesity   . Pneumonia 2014  . Pulmonary embolism ([place])   . Sinusitis nasal   . Sleep apnea    "I'm suppose to wear a mask but I don't" (04/16/2015)  . Varicose veins of right lower extremity   . Venous stasis of lower extremity    right    Family History  Problem Relation Age of Onset  . Kidney disease Mother        kidney transplant    Past Surgical History:  Procedure Laterality Date  . CHOLECYSTECTOMY N/A 04/18/2015   Procedure: LAPAROSCOPIC CHOLECYSTECTOMY;  Surgeon: Ralene Ok, MD;  Location: [facility];  Service: General;  Laterality: N/A;  . HYSTEROSCOPY W/D&C  12/24/2001   Archie Endo 09/28/2010  . I&D EXTREMITY Right 07/25/2016   Procedure: IRRIGATION AND DEBRIDEMENT RIGHT LEG ULCER, APPLY VERAFLO VAC;  Surgeon: [name], MD;  Location: [facility];  Service: Orthopedics;  Laterality: Right;  . INCISE AND DRAIN ABCESS Right 07/14/2016  . INCISION AND DRAINAGE Right 09/10/2008   leg:  skin and soft tissue and muscle/notes 09/15/2010  . INCISION AND DRAINAGE Right 01/01/2008   Chronic venous stasis insufficiency ulcer,/notes 09/14/2010/  . INCISION AND DRAINAGE Right 08/2351   calf w/application wound vac/notes 07/20/2010  . INCISION AND DRAINAGE Right 07/25/2016   IRRIGATION AND DEBRIDEMENT RIGHT LEG ULCER,  .  LAPAROSCOPIC GASTRIC BYPASS  ~ 2007  . SKIN GRAFT SPLIT THICKNESS LEG / FOOT Right 07/25/2016   LEG  . SKIN SPLIT GRAFT Right 07/27/2016   Procedure: SKIN GRAFT RIGHT LEG WITH THERASKIN APPLICATION;  Surgeon: [name], MD;  Location: [facility];  Service: Orthopedics;  Laterality: Right;  . TONSILLECTOMY     Social History   Occupational History  . Not on file  Tobacco Use  . Smoking status: Never Smoker  . Smokeless tobacco: Never Used  Substance and Sexual Activity  . Alcohol use: No  . Drug use: No  . Sexual activity: Yes    Partners: Male    Birth control/protection: IUD

## 2018-02-13 NOTE — Progress Notes (Signed)
Office Visit Note   Patient: Alice [name]           Date of Birth: 1970/07/13           MRN: [id number] Visit Date: 02/13/2018              Requested by: Benito Mccreedy, MD [place], [place] [address] PCP: Benito Mccreedy, MD  Chief Complaint  Patient presents with  . Right Leg - Follow-up      HPI: The patient is a 47 year old female who is seen for follow-up of her right lower extremity chronic venous ulceration.  She is been on the compression wrap for the past week.  She reports that she has been taking protein supplementation as directed and her last albumin in the middle of last month was improved to 3.2.  She is on warfarin and continues to follow with the Coumadin clinic.  She is seen today with Dr. Sharol Given.  We have discussed evaluation by plastic surgery, Dr.Dillingham at her last visit and she is going to try to see her at the end of the month.  Assessment & Plan: Visit Diagnoses:  1. 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 mild protein calorie malnutrition   3. BMI 50.0-59.9, adult [facility])     Plan: Will use some Iodosorb to the wound bed, cover with Adaptic and then apply a Dynaflex compression wrap.  Dr. Deirdre Peer recommended Trental 400 mg p.o. 3 times daily and this has been called to her pharmacy..  Her tramadol was refilled at this visit.  The patient will follow-up in 1 week.  Follow-Up Instructions: Return in about 1 week (around 02/20/2018).   Ortho Exam  Patient is alert, oriented, no adenopathy, well-dressed, normal affect, normal respiratory effort. The right lower extremity wound has at least 60% pink granulation, 40% yellow slough.  No signs of infection.  No periwound irritation.  No signs of cellulitis.  Moderate edema of the lower extremity.  Although this is overall much improved from previous.  Minimal serous appearing drainage.  The wound is approximately 10 x 15 x 0.5  cm.  Imaging: No results found.   Labs: Lab Results  Component Value Date   REPTSTATUS 01/04/2017 FINAL 12/30/2016   REPTSTATUS 01/04/2017 FINAL 12/30/2016   GRAMSTAIN  05/17/2013    MODERATE WBC PRESENT, PREDOMINANTLY MONONUCLEAR MODERATE GRAM POSITIVE COCCI IN PAIRS IN CHAINS RARE GRAM NEGATIVE RODS Gram Stain Report Called to,Read Back By and Verified With: HOPPER,M RN @ [phone] 05/17/13 LEONARD,A   GRAMSTAIN  05/17/2013    MODERATE WBC PRESENT, PREDOMINANTLY MONONUCLEAR MODERATE SQUAMOUS EPITHELIAL CELLS PRESENT MODERATE GRAM POSITIVE COCCI IN PAIRS IN CHAINS RARE GRAM NEGATIVE RODS Performed at [facility]   CULT NO GROWTH 5 DAYS 12/30/2016   CULT NO GROWTH 5 DAYS 12/30/2016   LABORGA ESCHERICHIA COLI (A) 12/29/2016     Lab Results  Component Value Date   ALBUMIN 3.2 (L) 01/29/2018   ALBUMIN 2.2 (L) 01/03/2017   ALBUMIN 2.1 (L) 01/02/2017    Body mass index is 53.1 kg/m.  Orders:  No orders of the defined types were placed in this encounter.  Meds ordered this encounter  Medications  . pentoxifylline (TRENTAL) 400 MG CR tablet    Sig: Take 1 tablet (400 mg total) by mouth 3 (three) times daily with meals.    Dispense:  90 tablet    Refill:  1  .  traMADol (ULTRAM) 50 MG tablet    Sig: Take 1 tablet (50 mg total) by mouth every 6 (six) hours as needed for moderate pain.    Dispense:  30 tablet    Refill:  0     Procedures: No procedures performed  Clinical Data: No additional findings.  ROS:  All other systems negative, except as noted in the HPI. Review of Systems  Objective: Vital Signs: Ht 5\' 6"  (1.676 m)   Wt (!) 329 lb (149.2 kg)   BMI 53.10 kg/m   Specialty Comments:  No specialty comments available.  PMFS History: Patient Active Problem List   Diagnosis Date Noted  . Iron deficiency anemia 01/29/2018  . Pulmonary embolus ([place]) 01/09/2018  . Encounter for therapeutic drug monitoring 01/09/2018  . Severe malnutrition ([place])  08/24/2017  . Acute lower UTI 12/29/2016  . Sepsis ([place]) 12/29/2016  . Acute kidney injury superimposed on CKD ([place]) 12/28/2016  . Atypical chest pain 12/28/2016  . Hx of skin graft 08/01/2016  . 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 ([place]) 07/25/2016  . 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 Trichomonal infection 11/24/2015  . Abdominal pain 05/02/2015  . Symptomatic cholelithiasis 04/16/2015  . Acute bilateral upper abdominal pain 04/16/2015  . Microcytic anemia 05/18/2013  . Hypotension, unspecified 05/17/2013  . CKD (chronic kidney disease), stage III ([place]) 05/17/2013  . Hypokalemia 05/17/2013  . Anal fissure 02/06/2013  . Anal skin tag 02/06/2013  . ALLERGIC RHINITIS 03/05/2010  . LOW BACK PAIN SYNDROME 12/13/2007  . WOUND INFECTION 10/10/2007  . OBESITY, MORBID 12/02/2006  . HTN (hypertension) 12/02/2006  . History of pulmonary embolism 12/02/2006  . History of DVT (deep vein thrombosis) 12/02/2006  . 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 GASTROESOPHAGEAL REFLUX DISEASE 12/02/2006  . OSA (obstructive sleep apnea) 12/02/2006   Past Medical History:  Diagnosis Date  . Anemia   . Anxiety   . Chronic bronchitis ([place])   . Chronic upper back pain   . Depression   . DVT (deep venous thrombosis) (HCC)    BLE  . GERD (gastroesophageal reflux disease)   . Headache    "weekly" (04/16/2015)  . Hypertension   . Migraine    "monthly" (04/16/2015)  . Obesity   . Pneumonia 2014  . Pulmonary embolism ([place])   . Sinusitis nasal   . Sleep apnea    "I'm suppose to wear a mask but I don't" (04/16/2015)  . Varicose veins of right lower extremity   . Venous stasis of lower extremity    right    Family History  Problem Relation Age of Onset  . Kidney disease Mother        kidney transplant    Past Surgical History:  Procedure Laterality Date  . CHOLECYSTECTOMY N/A  04/18/2015   Procedure: LAPAROSCOPIC CHOLECYSTECTOMY;  Surgeon: Ralene Ok, MD;  Location: [facility];  Service: General;  Laterality: N/A;  . HYSTEROSCOPY W/D&C  12/24/2001   Archie Endo 09/28/2010  . I&D EXTREMITY Right 07/25/2016   Procedure: IRRIGATION AND DEBRIDEMENT RIGHT LEG ULCER, APPLY VERAFLO VAC;  Surgeon: [name], MD;  Location: [facility];  Service: Orthopedics;  Laterality: Right;  . INCISE AND DRAIN ABCESS Right 07/14/2016  . INCISION AND DRAINAGE Right 09/10/2008   leg:  skin and soft tissue and muscle/notes 09/15/2010  . INCISION AND DRAINAGE Right 01/01/2008   Chronic venous stasis insufficiency ulcer,/notes 09/14/2010/  . INCISION  AND DRAINAGE Right 0/1720   calf w/application wound vac/notes 07/20/2010  . INCISION AND DRAINAGE Right 07/25/2016   IRRIGATION AND DEBRIDEMENT RIGHT LEG ULCER,  . LAPAROSCOPIC GASTRIC BYPASS  ~ 2007  . SKIN GRAFT SPLIT THICKNESS LEG / FOOT Right 07/25/2016   LEG  . SKIN SPLIT GRAFT Right 07/27/2016   Procedure: SKIN GRAFT RIGHT LEG WITH THERASKIN APPLICATION;  Surgeon: [name], MD;  Location: [facility];  Service: Orthopedics;  Laterality: Right;  . TONSILLECTOMY     Social History   Occupational History  . Not on file  Tobacco Use  . Smoking status: Never Smoker  . Smokeless tobacco: Never Used  Substance and Sexual Activity  . Alcohol use: No  . Drug use: No  . Sexual activity: Yes    Partners: Male    Birth control/protection: IUD

## 2018-02-14 ENCOUNTER — Telehealth: Payer: Self-pay

## 2018-02-14 ENCOUNTER — Ambulatory Visit (INDEPENDENT_AMBULATORY_CARE_PROVIDER_SITE_OTHER): Payer: Medicare Other | Admitting: *Deleted

## 2018-02-14 DIAGNOSIS — Z5181 Encounter for therapeutic drug level monitoring: Secondary | ICD-10-CM

## 2018-02-14 DIAGNOSIS — Z86718 Personal history of other venous thrombosis and embolism: Secondary | ICD-10-CM | POA: Diagnosis not present

## 2018-02-14 LAB — POCT INR: INR: 2.3 (ref 2.0–3.0)

## 2018-02-14 NOTE — Telephone Encounter (Signed)
Per 10/1 voice messages. Patient called and stated due to family emergency she needed to be r/s. Returned call (2x) and no answer. Return call on 10/2 and spoke with patient and was able to schedule her for Friday afternoon.

## 2018-02-16 ENCOUNTER — Inpatient Hospital Stay: Payer: Medicare Other | Attending: Internal Medicine

## 2018-02-16 VITALS — BP 130/81 | HR 85 | Temp 98.5°F | Resp 16

## 2018-02-16 DIAGNOSIS — D509 Iron deficiency anemia, unspecified: Secondary | ICD-10-CM | POA: Diagnosis not present

## 2018-02-16 MED ORDER — SODIUM CHLORIDE 0.9 % IV SOLN
Freq: Once | INTRAVENOUS | Status: AC
  Administered 2018-02-16: 15:00:00 via INTRAVENOUS
  Filled 2018-02-16: qty 250

## 2018-02-16 MED ORDER — SODIUM CHLORIDE 0.9 % IV SOLN
510.0000 mg | Freq: Once | INTRAVENOUS | Status: AC
  Administered 2018-02-16: 510 mg via INTRAVENOUS
  Filled 2018-02-16: qty 17

## 2018-02-16 NOTE — Patient Instructions (Signed)

## 2018-02-20 ENCOUNTER — Ambulatory Visit (INDEPENDENT_AMBULATORY_CARE_PROVIDER_SITE_OTHER): Payer: Medicare Other | Admitting: Orthopedic Surgery

## 2018-02-26 ENCOUNTER — Ambulatory Visit (INDEPENDENT_AMBULATORY_CARE_PROVIDER_SITE_OTHER): Payer: Medicare Other | Admitting: *Deleted

## 2018-02-26 DIAGNOSIS — I2699 Other pulmonary embolism without acute cor pulmonale: Secondary | ICD-10-CM

## 2018-02-26 DIAGNOSIS — Z86718 Personal history of other venous thrombosis and embolism: Secondary | ICD-10-CM | POA: Diagnosis not present

## 2018-02-26 DIAGNOSIS — Z5181 Encounter for therapeutic drug level monitoring: Secondary | ICD-10-CM | POA: Diagnosis not present

## 2018-02-26 LAB — POCT INR: INR: 2.3 (ref 2.0–3.0)

## 2018-02-26 NOTE — Patient Instructions (Signed)
Description   Continue taking 3 tablets daily except 4 tablets on Wednesdays and Sundays.  Recheck INR in 2 weeks.  Coumadin Clinic [phone] Main [phone]

## 2018-03-09 ENCOUNTER — Ambulatory Visit (INDEPENDENT_AMBULATORY_CARE_PROVIDER_SITE_OTHER): Payer: Medicare Other | Admitting: Physician Assistant

## 2018-03-09 ENCOUNTER — Encounter (INDEPENDENT_AMBULATORY_CARE_PROVIDER_SITE_OTHER): Payer: Self-pay | Admitting: Physician Assistant

## 2018-03-09 DIAGNOSIS — L97919 Non-pressure chronic ulcer of unspecified part of right lower leg with unspecified severity: Secondary | ICD-10-CM

## 2018-03-09 DIAGNOSIS — E440 Moderate protein-calorie malnutrition: Secondary | ICD-10-CM

## 2018-03-09 DIAGNOSIS — Z6841 Body Mass Index (BMI) 40.0 and over, adult: Secondary | ICD-10-CM

## 2018-03-09 DIAGNOSIS — I87331 Chronic venous hypertension (idiopathic) with ulcer and inflammation of right lower extremity: Secondary | ICD-10-CM

## 2018-03-09 NOTE — Progress Notes (Signed)
Office Visit Note   Patient: Alice [name]           Date of Birth: 10-Jan-1971           MRN: [id number] Visit Date: 03/09/2018              Requested by: Benito Mccreedy, MD [place], [place] [address] PCP: Benito Mccreedy, MD  No chief complaint on file.     HPI: The patient is a 47 year old female who is seen in follow-up for her chronic ulceration of her right lower extremity.  She reports she has continued trying to get protein supplements daily.  We have been utilizing compression and local wound care.  She did not tolerate probiotics to the wound bed due to complaints of burning pain.  She is on warfarin and continues to follow-up with the Coumadin clinic.  Assessment & Plan: Visit Diagnoses:  1. 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 mild protein calorie malnutrition   3. BMI 50.0-59.9, adult [facility])     Plan: We are going to trial some Pleuro-gel to the wound bed today and cover with Adaptic and then a Dynaflex compression dressing for the next 5 days.  The patient is in agreement with a trial of the pleura gel.  The patient reports that she has been able to achieve full healing of the wounds in the past with silver compression stockings and she would like to try to get some of these as well over the next couple of weeks.  Prescription was given to the patient for these she will follow-up next Tuesday.  The  Follow-Up Instructions: Return in about 4 days (around 03/13/2018).   Ortho Exam  Patient is alert, oriented, no adenopathy, well-dressed, normal affect, normal respiratory effort. The right lower extremity venous ulcer has more fibrinous yellow slough present today.  There are some pink granulation islands but the one wound bed is 80 to 90% fibrinous tissue today.  Her edema is not as severe as it has been in the past but she still has pitting edema present.  Her foot is warm and she  has a good dorsalis pedis pulse.  Overall wound is approximately 10 x 14 x 0.5 cm.  Imaging: No results found.   Labs: Lab Results  Component Value Date   REPTSTATUS 01/04/2017 FINAL 12/30/2016   REPTSTATUS 01/04/2017 FINAL 12/30/2016   GRAMSTAIN  05/17/2013    MODERATE WBC PRESENT, PREDOMINANTLY MONONUCLEAR MODERATE GRAM POSITIVE COCCI IN PAIRS IN CHAINS RARE GRAM NEGATIVE RODS Gram Stain Report Called to,Read Back By and Verified With: HOPPER,M RN @ [phone] 05/17/13 LEONARD,A   GRAMSTAIN  05/17/2013    MODERATE WBC PRESENT, PREDOMINANTLY MONONUCLEAR MODERATE SQUAMOUS EPITHELIAL CELLS PRESENT MODERATE GRAM POSITIVE COCCI IN PAIRS IN CHAINS RARE GRAM NEGATIVE RODS Performed at [facility]   CULT NO GROWTH 5 DAYS 12/30/2016   CULT NO GROWTH 5 DAYS 12/30/2016   LABORGA ESCHERICHIA COLI (A) 12/29/2016     Lab Results  Component Value Date   ALBUMIN 3.2 (L) 01/29/2018   ALBUMIN 2.2 (L) 01/03/2017   ALBUMIN 2.1 (L) 01/02/2017    There is no height or weight on file to calculate BMI.  Orders:  No orders of the defined types were placed in this encounter.  No orders of the defined types were placed in this encounter.    Procedures: No procedures performed  Clinical Data: No additional  findings.  ROS:  All other systems negative, except as noted in the HPI. Review of Systems  Objective: Vital Signs: There were no vitals taken for this visit.  Specialty Comments:  No specialty comments available.  PMFS History: Patient Active Problem List   Diagnosis Date Noted  . Iron deficiency anemia 01/29/2018  . Pulmonary embolus ([place]) 01/09/2018  . Encounter for therapeutic drug monitoring 01/09/2018  . Severe malnutrition ([place]) 08/24/2017  . Acute lower UTI 12/29/2016  . Sepsis ([place]) 12/29/2016  . Acute kidney injury superimposed on CKD ([place]) 12/28/2016  . Atypical chest pain 12/28/2016  . Hx of skin graft 08/01/2016  . Venous ulcer of right lower extremity  with varicose veins ([place]) 07/25/2016  . 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 Trichomonal infection 11/24/2015  . Abdominal pain 05/02/2015  . Symptomatic cholelithiasis 04/16/2015  . Acute bilateral upper abdominal pain 04/16/2015  . Microcytic anemia 05/18/2013  . Hypotension, unspecified 05/17/2013  . CKD (chronic kidney disease), stage III ([place]) 05/17/2013  . Hypokalemia 05/17/2013  . Anal fissure 02/06/2013  . Anal skin tag 02/06/2013  . ALLERGIC RHINITIS 03/05/2010  . LOW BACK PAIN SYNDROME 12/13/2007  . WOUND INFECTION 10/10/2007  . OBESITY, MORBID 12/02/2006  . HTN (hypertension) 12/02/2006  . History of pulmonary embolism 12/02/2006  . History of DVT (deep vein thrombosis) 12/02/2006  . 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 GASTROESOPHAGEAL REFLUX DISEASE 12/02/2006  . OSA (obstructive sleep apnea) 12/02/2006   Past Medical History:  Diagnosis Date  . Anemia   . Anxiety   . Chronic bronchitis ([place])   . Chronic upper back pain   . Depression   . DVT (deep venous thrombosis) (HCC)    BLE  . GERD (gastroesophageal reflux disease)   . Headache    "weekly" (04/16/2015)  . Hypertension   . Migraine    "monthly" (04/16/2015)  . Obesity   . Pneumonia 2014  . Pulmonary embolism ([place])   . Sinusitis nasal   . Sleep apnea    "I'm suppose to wear a mask but I don't" (04/16/2015)  . Varicose veins of right lower extremity   . Venous stasis of lower extremity    right    Family History  Problem Relation Age of Onset  . Kidney disease Mother        kidney transplant    Past Surgical History:  Procedure Laterality Date  . CHOLECYSTECTOMY N/A 04/18/2015   Procedure: LAPAROSCOPIC CHOLECYSTECTOMY;  Surgeon: Ralene Ok, MD;  Location: [facility];  Service: General;  Laterality: N/A;  . HYSTEROSCOPY W/D&C  12/24/2001   Archie Endo 09/28/2010  . I&D EXTREMITY Right 07/25/2016   Procedure:  IRRIGATION AND DEBRIDEMENT RIGHT LEG ULCER, APPLY VERAFLO VAC;  Surgeon: [name], MD;  Location: [facility];  Service: Orthopedics;  Laterality: Right;  . INCISE AND DRAIN ABCESS Right 07/14/2016  . INCISION AND DRAINAGE Right 09/10/2008   leg:  skin and soft tissue and muscle/notes 09/15/2010  . INCISION AND DRAINAGE Right 01/01/2008   Chronic venous stasis insufficiency ulcer,/notes 09/14/2010/  . INCISION AND DRAINAGE Right 10/9483   calf w/application wound vac/notes 07/20/2010  . INCISION AND DRAINAGE Right 07/25/2016   IRRIGATION AND DEBRIDEMENT RIGHT LEG ULCER,  . LAPAROSCOPIC GASTRIC BYPASS  ~ 2007  . SKIN GRAFT SPLIT THICKNESS LEG / FOOT Right 07/25/2016   LEG  . SKIN SPLIT GRAFT Right 07/27/2016   Procedure: SKIN GRAFT RIGHT LEG WITH  THERASKIN APPLICATION;  Surgeon: [name], MD;  Location: [facility];  Service: Orthopedics;  Laterality: Right;  . TONSILLECTOMY     Social History   Occupational History  . Not on file  Tobacco Use  . Smoking status: Never Smoker  . Smokeless tobacco: Never Used  Substance and Sexual Activity  . Alcohol use: No  . Drug use: No  . Sexual activity: Yes    Partners: Male    Birth control/protection: IUD

## 2018-03-12 ENCOUNTER — Ambulatory Visit (INDEPENDENT_AMBULATORY_CARE_PROVIDER_SITE_OTHER): Payer: Medicare Other | Admitting: Physician Assistant

## 2018-03-12 ENCOUNTER — Encounter (INDEPENDENT_AMBULATORY_CARE_PROVIDER_SITE_OTHER): Payer: Self-pay | Admitting: Physician Assistant

## 2018-03-12 VITALS — Ht 66.0 in | Wt 329.0 lb

## 2018-03-12 DIAGNOSIS — I87331 Chronic venous hypertension (idiopathic) with ulcer and inflammation of right lower extremity: Secondary | ICD-10-CM

## 2018-03-12 DIAGNOSIS — L97919 Non-pressure chronic ulcer of unspecified part of right lower leg with unspecified severity: Secondary | ICD-10-CM

## 2018-03-12 DIAGNOSIS — E440 Moderate protein-calorie malnutrition: Secondary | ICD-10-CM | POA: Diagnosis not present

## 2018-03-12 DIAGNOSIS — Z6841 Body Mass Index (BMI) 40.0 and over, adult: Secondary | ICD-10-CM | POA: Diagnosis not present

## 2018-03-12 NOTE — Progress Notes (Signed)
Office Visit Note   Patient: Alice [name]           Date of Birth: [birth year]           MRN: [id number] Visit Date: 03/12/2018              Requested by: Benito Mccreedy, MD [place] [address] [place], [place] [address] PCP: Benito Mccreedy, MD  Chief Complaint  Patient presents with  . Right Leg - Follow-up, Wound Check      HPI: The patient is a 47 yo female who is seen in follow up for her chronic right lower leg venous stasis ulceration. We applied Plurogel to the wound bed and a Dynaflex dressing and she is very pleased with the results and how well she was able to tolerate the Plurogel. She did removed the dressing early, as she felt there was a bad smell and reports she took a shower and used soap and water to the ulcer.  She continues with protein supplements as well.   Assessment & Plan: Visit Diagnoses:  1. 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 mild protein calorie malnutrition   3. BMI 50.0-59.9, adult [facility])     Plan: Will continue Plurogel to the wound bed and cover with Adaptic and then Dynaflex compression dressing. Will plan follow up in 1 week. If she has to take the dressing off, she is going to use some Plurogel to the wound bed and then cover with with adaptic or similar and then gauze and use Ace wrapping for edema control. She will follow up in 1 week.  Follow-Up Instructions: No follow-ups on file.   Ortho Exam  Patient is alert, oriented, no adenopathy, well-dressed, normal affect, normal respiratory effort. The right lower leg ulcer wound bed is much healthier looking with increased pink to red granulation and decreased yellow slough. No peri wound irritation or signs of cellulitis. Borders may have some early epithelial migration. Overall wound size is ~10 x 14 x 0.3 cm. Edema remains improved.   Imaging: No results found.   Labs: Lab Results  Component Value Date   REPTSTATUS 01/04/2017 FINAL 12/30/2016   REPTSTATUS 01/04/2017 FINAL 12/30/2016   GRAMSTAIN  05/17/2013    MODERATE WBC PRESENT, PREDOMINANTLY MONONUCLEAR MODERATE GRAM POSITIVE COCCI IN PAIRS IN CHAINS RARE GRAM NEGATIVE RODS Gram Stain Report Called to,Read Back By and Verified With: HOPPER,M RN @ [phone] 05/17/13 LEONARD,A   GRAMSTAIN  05/17/2013    MODERATE WBC PRESENT, PREDOMINANTLY MONONUCLEAR MODERATE SQUAMOUS EPITHELIAL CELLS PRESENT MODERATE GRAM POSITIVE COCCI IN PAIRS IN CHAINS RARE GRAM NEGATIVE RODS Performed at [facility]   CULT NO GROWTH 5 DAYS 12/30/2016   CULT NO GROWTH 5 DAYS 12/30/2016   LABORGA ESCHERICHIA COLI (A) 12/29/2016     Lab Results  Component Value Date   ALBUMIN 3.2 (L) 01/29/2018   ALBUMIN 2.2 (L) 01/03/2017   ALBUMIN 2.1 (L) 01/02/2017    Body mass index is 53.1 kg/m.  Orders:  No orders of the defined types were placed in this encounter.  No orders of the defined types were placed in this encounter.    Procedures: No procedures performed  Clinical Data: No additional findings.  ROS:  All other systems negative, except as noted in the HPI. Review of Systems  Objective: Vital Signs: Ht 5\' 6"  (1.676 m)   Wt (!) 329 lb (149.2 kg)   BMI 53.10 kg/m  Specialty Comments:  No specialty comments available.  PMFS History: Patient Active Problem List   Diagnosis Date Noted  . Iron deficiency anemia 01/29/2018  . Pulmonary embolus ([place]) 01/09/2018  . Encounter for therapeutic drug monitoring 01/09/2018  . Severe malnutrition ([place]) 08/24/2017  . Acute lower UTI 12/29/2016  . Sepsis ([place]) 12/29/2016  . Acute kidney injury superimposed on CKD ([place]) 12/28/2016  . Atypical chest pain 12/28/2016  . Hx of skin graft 08/01/2016  . 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 ([place]) 07/25/2016  . 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 Trichomonal  infection 11/24/2015  . Abdominal pain 05/02/2015  . Symptomatic cholelithiasis 04/16/2015  . Acute bilateral upper abdominal pain 04/16/2015  . Microcytic anemia 05/18/2013  . Hypotension, unspecified 05/17/2013  . CKD (chronic kidney disease), stage III ([place]) 05/17/2013  . Hypokalemia 05/17/2013  . Anal fissure 02/06/2013  . Anal skin tag 02/06/2013  . ALLERGIC RHINITIS 03/05/2010  . LOW BACK PAIN SYNDROME 12/13/2007  . WOUND INFECTION 10/10/2007  . OBESITY, MORBID 12/02/2006  . HTN (hypertension) 12/02/2006  . History of pulmonary embolism 12/02/2006  . History of DVT (deep vein thrombosis) 12/02/2006  . 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 GASTROESOPHAGEAL REFLUX DISEASE 12/02/2006  . OSA (obstructive sleep apnea) 12/02/2006   Past Medical History:  Diagnosis Date  . Anemia   . Anxiety   . Chronic bronchitis ([place])   . Chronic upper back pain   . Depression   . DVT (deep venous thrombosis) (HCC)    BLE  . GERD (gastroesophageal reflux disease)   . Headache    "weekly" (04/16/2015)  . Hypertension   . Migraine    "monthly" (04/16/2015)  . Obesity   . Pneumonia 2014  . Pulmonary embolism ([place])   . Sinusitis nasal   . Sleep apnea    "I'm suppose to wear a mask but I don't" (04/16/2015)  . Varicose veins of right lower extremity   . Venous stasis of lower extremity    right    Family History  Problem Relation Age of Onset  . Kidney disease Mother        kidney transplant    Past Surgical History:  Procedure Laterality Date  . CHOLECYSTECTOMY N/A 04/18/2015   Procedure: LAPAROSCOPIC CHOLECYSTECTOMY;  Surgeon: Ralene Ok, MD;  Location: [facility];  Service: General;  Laterality: N/A;  . HYSTEROSCOPY W/D&C  12/24/2001   Archie Endo 09/28/2010  . I&D EXTREMITY Right 07/25/2016   Procedure: IRRIGATION AND DEBRIDEMENT RIGHT LEG ULCER, APPLY VERAFLO VAC;  Surgeon: [name], MD;  Location: [facility];  Service: Orthopedics;  Laterality: Right;  . INCISE AND  DRAIN ABCESS Right 07/14/2016  . INCISION AND DRAINAGE Right 09/10/2008   leg:  skin and soft tissue and muscle/notes 09/15/2010  . INCISION AND DRAINAGE Right 01/01/2008   Chronic venous stasis insufficiency ulcer,/notes 09/14/2010/  . INCISION AND DRAINAGE Right 12/6379   calf w/application wound vac/notes 07/20/2010  . INCISION AND DRAINAGE Right 07/25/2016   IRRIGATION AND DEBRIDEMENT RIGHT LEG ULCER,  . LAPAROSCOPIC GASTRIC BYPASS  ~ 2007  . SKIN GRAFT SPLIT THICKNESS LEG / FOOT Right 07/25/2016   LEG  . SKIN SPLIT GRAFT Right 07/27/2016   Procedure: SKIN GRAFT RIGHT LEG WITH THERASKIN APPLICATION;  Surgeon: [name], MD;  Location: [facility];  Service: Orthopedics;  Laterality: Right;  . TONSILLECTOMY     Social History   Occupational  History  . Not on file  Tobacco Use  . Smoking status: Never Smoker  . Smokeless tobacco: Never Used  Substance and Sexual Activity  . Alcohol use: No  . Drug use: No  . Sexual activity: Yes    Partners: Male    Birth control/protection: IUD

## 2018-03-14 ENCOUNTER — Ambulatory Visit (INDEPENDENT_AMBULATORY_CARE_PROVIDER_SITE_OTHER): Payer: Medicare Other | Admitting: *Deleted

## 2018-03-14 DIAGNOSIS — I2699 Other pulmonary embolism without acute cor pulmonale: Secondary | ICD-10-CM

## 2018-03-14 DIAGNOSIS — Z5181 Encounter for therapeutic drug level monitoring: Secondary | ICD-10-CM

## 2018-03-14 DIAGNOSIS — Z86718 Personal history of other venous thrombosis and embolism: Secondary | ICD-10-CM | POA: Diagnosis not present

## 2018-03-14 LAB — POCT INR: INR: 2.7 (ref 2.0–3.0)

## 2018-03-14 NOTE — Patient Instructions (Signed)
Description   Continue taking 3 tablets daily except 4 tablets on Wednesdays and Sundays.  Recheck INR in 3 weeks.  Coumadin Clinic [phone] Main [phone]

## 2018-03-15 DIAGNOSIS — I10 Essential (primary) hypertension: Secondary | ICD-10-CM | POA: Diagnosis not present

## 2018-03-15 DIAGNOSIS — Z0001 Encounter for general adult medical examination with abnormal findings: Secondary | ICD-10-CM | POA: Diagnosis not present

## 2018-03-15 DIAGNOSIS — R7303 Prediabetes: Secondary | ICD-10-CM | POA: Diagnosis not present

## 2018-03-15 DIAGNOSIS — E559 Vitamin D deficiency, unspecified: Secondary | ICD-10-CM | POA: Diagnosis not present

## 2018-03-15 DIAGNOSIS — F329 Major depressive disorder, single episode, unspecified: Secondary | ICD-10-CM | POA: Diagnosis not present

## 2018-03-15 DIAGNOSIS — R1013 Epigastric pain: Secondary | ICD-10-CM | POA: Diagnosis not present

## 2018-03-15 DIAGNOSIS — G4733 Obstructive sleep apnea (adult) (pediatric): Secondary | ICD-10-CM | POA: Diagnosis not present

## 2018-03-15 DIAGNOSIS — G629 Polyneuropathy, unspecified: Secondary | ICD-10-CM | POA: Diagnosis not present

## 2018-03-15 DIAGNOSIS — Z2821 Immunization not carried out because of patient refusal: Secondary | ICD-10-CM | POA: Diagnosis not present

## 2018-03-20 ENCOUNTER — Encounter (INDEPENDENT_AMBULATORY_CARE_PROVIDER_SITE_OTHER): Payer: Self-pay | Admitting: Physician Assistant

## 2018-03-20 ENCOUNTER — Ambulatory Visit (INDEPENDENT_AMBULATORY_CARE_PROVIDER_SITE_OTHER): Payer: Medicare Other | Admitting: Physician Assistant

## 2018-03-20 VITALS — Ht 66.0 in | Wt 329.0 lb

## 2018-03-20 DIAGNOSIS — Z6841 Body Mass Index (BMI) 40.0 and over, adult: Secondary | ICD-10-CM

## 2018-03-20 DIAGNOSIS — I87331 Chronic venous hypertension (idiopathic) with ulcer and inflammation of right lower extremity: Secondary | ICD-10-CM

## 2018-03-20 DIAGNOSIS — E440 Moderate protein-calorie malnutrition: Secondary | ICD-10-CM | POA: Diagnosis not present

## 2018-03-20 DIAGNOSIS — L97919 Non-pressure chronic ulcer of unspecified part of right lower leg with unspecified severity: Secondary | ICD-10-CM

## 2018-03-21 ENCOUNTER — Encounter (INDEPENDENT_AMBULATORY_CARE_PROVIDER_SITE_OTHER): Payer: Self-pay | Admitting: Physician Assistant

## 2018-03-21 NOTE — Progress Notes (Signed)
Office Visit Note   Patient: Alice [name]           Date of Birth: [birth year]           MRN: [id number] Visit Date: 03/20/2018              Requested by: Benito Mccreedy, MD [place], [place] [address] PCP: Benito Mccreedy, MD  Chief Complaint  Patient presents with  . Right Leg - Follow-up      HPI: Grade patient is a 47 year old female who is seen for right lower extremity chronic venous insufficiency ulcer.  She has been utilizing pleuro-gel to the area and reports that she feels it is helping greatly but that it smells very bad after 2 days and she has to remove the dressing and shower.  She states that the wound bed is much redder and healthier appearing.  She is continuing to try to supplement with protein shakes and increasing her activity level.  Assessment & Plan: Visit Diagnoses:  1. 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 mild protein calorie malnutrition   3. BMI 50.0-59.9, adult (HCC)     Plan: Continue pleuro-gel to the wound bed and Dynaflex layered compression dressing.  Working to have her follow-up 2 times weekly for wraps.  She is not interested in further skin grafting at this point and will continue with the current plan as she does appear to be slowly improving.  Follow-Up Instructions: Return in about 3 days (around 03/23/2018).   Ortho Exam  Patient is alert, oriented, no adenopathy, well-dressed, normal affect, normal respiratory effort. The right medial lower leg wound is approximately 10 x 14.5 x 0.3 cm with a beefy red wound bed approximately 85% with 15% yellow slough.  There are no signs of cellulitis or infection.  Her edema continues to be much improved.  Imaging: No results found.   Labs: Lab Results  Component Value Date   REPTSTATUS 01/04/2017 FINAL 12/30/2016   REPTSTATUS 01/04/2017 FINAL 12/30/2016   GRAMSTAIN  05/17/2013    MODERATE WBC PRESENT,  PREDOMINANTLY MONONUCLEAR MODERATE GRAM POSITIVE COCCI IN PAIRS IN CHAINS RARE GRAM NEGATIVE RODS Gram Stain Report Called to,Read Back By and Verified With: HOPPER,M RN @ [phone] 05/17/13 LEONARD,A   GRAMSTAIN  05/17/2013    MODERATE WBC PRESENT, PREDOMINANTLY MONONUCLEAR MODERATE SQUAMOUS EPITHELIAL CELLS PRESENT MODERATE GRAM POSITIVE COCCI IN PAIRS IN CHAINS RARE GRAM NEGATIVE RODS Performed at [facility]   CULT NO GROWTH 5 DAYS 12/30/2016   CULT NO GROWTH 5 DAYS 12/30/2016   LABORGA ESCHERICHIA COLI (A) 12/29/2016     Lab Results  Component Value Date   ALBUMIN 3.2 (L) 01/29/2018   ALBUMIN 2.2 (L) 01/03/2017   ALBUMIN 2.1 (L) 01/02/2017    Body mass index is 53.1 kg/m.  Orders:  No orders of the defined types were placed in this encounter.  No orders of the defined types were placed in this encounter.    Procedures: No procedures performed  Clinical Data: No additional findings.  ROS:  All other systems negative, except as noted in the HPI. Review of Systems  Objective: Vital Signs: Ht 5\' 6"  (1.676 m)   Wt (!) 329 lb (149.2 kg)   BMI 53.10 kg/m   Specialty Comments:  No specialty comments available.  PMFS History: Patient Active Problem List   Diagnosis Date Noted  . Iron deficiency anemia 01/29/2018  . Pulmonary  embolus ([place]) 01/09/2018  . Encounter for therapeutic drug monitoring 01/09/2018  . Severe malnutrition ([place]) 08/24/2017  . Acute lower UTI 12/29/2016  . Sepsis ([place]) 12/29/2016  . Acute kidney injury superimposed on CKD ([place]) 12/28/2016  . Atypical chest pain 12/28/2016  . Hx of skin graft 08/01/2016  . 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 ([place]) 07/25/2016  . 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 Trichomonal infection 11/24/2015  . Abdominal pain 05/02/2015  . Symptomatic cholelithiasis 04/16/2015  . Acute bilateral upper abdominal pain  04/16/2015  . Microcytic anemia 05/18/2013  . Hypotension, unspecified 05/17/2013  . CKD (chronic kidney disease), stage III ([place]) 05/17/2013  . Hypokalemia 05/17/2013  . Anal fissure 02/06/2013  . Anal skin tag 02/06/2013  . ALLERGIC RHINITIS 03/05/2010  . LOW BACK PAIN SYNDROME 12/13/2007  . WOUND INFECTION 10/10/2007  . OBESITY, MORBID 12/02/2006  . HTN (hypertension) 12/02/2006  . History of pulmonary embolism 12/02/2006  . History of DVT (deep vein thrombosis) 12/02/2006  . 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 GASTROESOPHAGEAL REFLUX DISEASE 12/02/2006  . OSA (obstructive sleep apnea) 12/02/2006   Past Medical History:  Diagnosis Date  . Anemia   . Anxiety   . Chronic bronchitis ([place])   . Chronic upper back pain   . Depression   . DVT (deep venous thrombosis) (HCC)    BLE  . GERD (gastroesophageal reflux disease)   . Headache    "weekly" (04/16/2015)  . Hypertension   . Migraine    "monthly" (04/16/2015)  . Obesity   . Pneumonia 2014  . Pulmonary embolism ([place])   . Sinusitis nasal   . Sleep apnea    "I'm suppose to wear a mask but I don't" (04/16/2015)  . Varicose veins of right lower extremity   . Venous stasis of lower extremity    right    Family History  Problem Relation Age of Onset  . Kidney disease Mother        kidney transplant    Past Surgical History:  Procedure Laterality Date  . CHOLECYSTECTOMY N/A 04/18/2015   Procedure: LAPAROSCOPIC CHOLECYSTECTOMY;  Surgeon: Ralene Ok, MD;  Location: [place];  Service: General;  Laterality: N/A;  . HYSTEROSCOPY W/D&C  12/24/2001   Archie Endo 09/28/2010  . I&D EXTREMITY Right 07/25/2016   Procedure: IRRIGATION AND DEBRIDEMENT RIGHT LEG ULCER, APPLY VERAFLO VAC;  Surgeon: [name], MD;  Location: [facility];  Service: Orthopedics;  Laterality: Right;  . INCISE AND DRAIN ABCESS Right 07/14/2016  . INCISION AND DRAINAGE Right 09/10/2008   leg:  skin and soft tissue and muscle/notes 09/15/2010  .  INCISION AND DRAINAGE Right 01/01/2008   Chronic venous stasis insufficiency ulcer,/notes 09/14/2010/  . INCISION AND DRAINAGE Right 07/4740   calf w/application wound vac/notes 07/20/2010  . INCISION AND DRAINAGE Right 07/25/2016   IRRIGATION AND DEBRIDEMENT RIGHT LEG ULCER,  . LAPAROSCOPIC GASTRIC BYPASS  ~ 2007  . SKIN GRAFT SPLIT THICKNESS LEG / FOOT Right 07/25/2016   LEG  . SKIN SPLIT GRAFT Right 07/27/2016   Procedure: SKIN GRAFT RIGHT LEG WITH THERASKIN APPLICATION;  Surgeon: [name], MD;  Location: [facility];  Service: Orthopedics;  Laterality: Right;  . TONSILLECTOMY     Social History   Occupational History  . Not on file  Tobacco Use  . Smoking status: Never Smoker  . Smokeless tobacco: Never Used  Substance and Sexual Activity  .  Alcohol use: No  . Drug use: No  . Sexual activity: Yes    Partners: Male    Birth control/protection: IUD

## 2018-03-22 ENCOUNTER — Ambulatory Visit (INDEPENDENT_AMBULATORY_CARE_PROVIDER_SITE_OTHER): Payer: Medicare Other | Admitting: Physician Assistant

## 2018-03-26 ENCOUNTER — Ambulatory Visit (INDEPENDENT_AMBULATORY_CARE_PROVIDER_SITE_OTHER): Payer: Medicare Other | Admitting: Physician Assistant

## 2018-03-26 ENCOUNTER — Encounter (INDEPENDENT_AMBULATORY_CARE_PROVIDER_SITE_OTHER): Payer: Self-pay | Admitting: Orthopedic Surgery

## 2018-03-26 VITALS — Ht 66.0 in | Wt 329.0 lb

## 2018-03-26 DIAGNOSIS — E440 Moderate protein-calorie malnutrition: Secondary | ICD-10-CM | POA: Diagnosis not present

## 2018-03-26 DIAGNOSIS — I87331 Chronic venous hypertension (idiopathic) with ulcer and inflammation of right lower extremity: Secondary | ICD-10-CM | POA: Diagnosis not present

## 2018-03-26 DIAGNOSIS — L97919 Non-pressure chronic ulcer of unspecified part of right lower leg with unspecified severity: Secondary | ICD-10-CM

## 2018-03-26 DIAGNOSIS — Z6841 Body Mass Index (BMI) 40.0 and over, adult: Secondary | ICD-10-CM

## 2018-03-26 MED ORDER — DULOXETINE HCL 30 MG PO CPEP: 30.0000 mg | ORAL_CAPSULE | Freq: Every day | ORAL | 3 refills | Status: DC

## 2018-03-26 MED ORDER — TRAMADOL HCL 50 MG PO TABS: 50.0000 mg | ORAL_TABLET | Freq: Four times a day (QID) | ORAL | 0 refills | Status: DC | PRN

## 2018-03-28 ENCOUNTER — Encounter (INDEPENDENT_AMBULATORY_CARE_PROVIDER_SITE_OTHER): Payer: Self-pay | Admitting: Physician Assistant

## 2018-03-28 NOTE — Progress Notes (Signed)
Office Visit Note   Patient: Alice [name]           Date of Birth: 08-21-1970           MRN: [id number] Visit Date: 03/26/2018              Requested by: Benito Mccreedy, MD [place],  [address] PCP: Benito Mccreedy, MD  Chief Complaint  Patient presents with  . Right Leg - Follow-up      HPI: The patient is a 47 year old female who is seen for follow-up of her right lower extremity chronic venous ulceration.  We have been doing pleuro-gel and Dynaflex compression wraps.  She tried Trental but was not able to tolerate it due to nausea vomiting.  She reports continued significant pain and is tried gabapentin in the past but reports that it did not work.  She is willing to try Cymbalta but also to request some tramadol for pain.  She reports her last dressing change was on Saturday which she did herself at home.  She does endorse that she does not wrap her foot but starts at the ankle with her wrapping.  She endorses that [place] and does note more drainage, pain in the wound area, and more odor from the room area this week.  Assessment & Plan: Visit Diagnoses:  1. 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 mild protein calorie malnutrition   3. BMI 50.0-59.9, adult [facility])     Plan: We are going to start a trial of Cymbalta for the patient's chronic pain symptoms and the patient is willing to do this.  She does have more ischemia in the wound bed today and we are going to do another Dynaflex wrap with pleura gel and have her follow-up next week or sooner should she have difficulties in the interim.  Follow-Up Instructions: Return in about 1 week (around 04/02/2018).   Ortho Exam  Patient is alert, oriented, no adenopathy, well-dressed, normal affect, normal respiratory effort. The wound bed does have several gray areas which appear to be more  ischemic.  The rest of the wound bed continues to show good beefy red granulation.  There is some odor and moderate drainage from the area but there is no signs of cellulitis or gross infection.  There is a retained staple in the wound bed which the patient will not allow [place] to remove due to complaints of severe pain  Imaging: No results found.   Labs: Lab Results  Component Value Date   REPTSTATUS 01/04/2017 FINAL 12/30/2016   REPTSTATUS 01/04/2017 FINAL 12/30/2016   GRAMSTAIN  05/17/2013    MODERATE WBC PRESENT, PREDOMINANTLY MONONUCLEAR MODERATE GRAM POSITIVE COCCI IN PAIRS IN CHAINS RARE GRAM NEGATIVE RODS Gram Stain Report Called to,Read Back By and Verified With: HOPPER,M RN @ [phone] 05/17/13 LEONARD,A   GRAMSTAIN  05/17/2013    MODERATE WBC PRESENT, PREDOMINANTLY MONONUCLEAR MODERATE SQUAMOUS EPITHELIAL CELLS PRESENT MODERATE GRAM POSITIVE COCCI IN PAIRS IN CHAINS RARE GRAM NEGATIVE RODS Performed at [facility]   CULT NO GROWTH 5 DAYS 12/30/2016   CULT NO GROWTH 5 DAYS 12/30/2016   LABORGA ESCHERICHIA COLI (A) 12/29/2016     Lab Results  Component Value Date   ALBUMIN 3.2 (L) 01/29/2018   ALBUMIN 2.2 (L) 01/03/2017   ALBUMIN 2.1 (L) 01/02/2017    Body mass index is  53.1 kg/m.  Orders:  No orders of the defined types were placed in this encounter.  Meds ordered this encounter  Medications  . traMADol (ULTRAM) 50 MG tablet    Sig: Take 1 tablet (50 mg total) by mouth every 6 (six) hours as needed for moderate pain.    Dispense:  30 tablet    Refill:  0  . DULoxetine (CYMBALTA) 30 MG capsule    Sig: Take 1 capsule (30 mg total) by mouth daily.    Dispense:  30 capsule    Refill:  3     Procedures: No procedures performed  Clinical Data: No additional findings.  ROS:  All other systems negative, except as noted in the HPI. Review of Systems  Objective: Vital Signs: Ht 5\' 6"  (1.676 m)   Wt (!) 329 lb (149.2 kg)   BMI 53.10 kg/m   Specialty  Comments:  No specialty comments available.  PMFS History: Patient Active Problem List   Diagnosis Date Noted  . Iron deficiency anemia 01/29/2018  . Pulmonary embolus ([place]) 01/09/2018  . Encounter for therapeutic drug monitoring 01/09/2018  . Severe malnutrition ([place]) 08/24/2017  . Acute lower UTI 12/29/2016  . Sepsis ([place]) 12/29/2016  . Acute kidney injury superimposed on CKD ([place]) 12/28/2016  . Atypical chest pain 12/28/2016  . Hx of skin graft 08/01/2016  . 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 ([place]) 07/25/2016  . 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 Trichomonal infection 11/24/2015  . Abdominal pain 05/02/2015  . Symptomatic cholelithiasis 04/16/2015  . Acute bilateral upper abdominal pain 04/16/2015  . Microcytic anemia 05/18/2013  . Hypotension, unspecified 05/17/2013  . CKD (chronic kidney disease), stage III ([place]) 05/17/2013  . Hypokalemia 05/17/2013  . Anal fissure 02/06/2013  . Anal skin tag 02/06/2013  . ALLERGIC RHINITIS 03/05/2010  . LOW BACK PAIN SYNDROME 12/13/2007  . WOUND INFECTION 10/10/2007  . OBESITY, MORBID 12/02/2006  . HTN (hypertension) 12/02/2006  . History of pulmonary embolism 12/02/2006  . History of DVT (deep vein thrombosis) 12/02/2006  . 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 GASTROESOPHAGEAL REFLUX DISEASE 12/02/2006  . OSA (obstructive sleep apnea) 12/02/2006   Past Medical History:  Diagnosis Date  . Anemia   . Anxiety   . Chronic bronchitis ([place])   . Chronic upper back pain   . Depression   . DVT (deep venous thrombosis) (HCC)    BLE  . GERD (gastroesophageal reflux disease)   . Headache    "weekly" (04/16/2015)  . Hypertension   . Migraine    "monthly" (04/16/2015)  . Obesity   . Pneumonia 2014  . Pulmonary embolism ([place])   . Sinusitis nasal   . Sleep apnea    "I'm suppose to wear a mask but I don't" (04/16/2015)  .  Varicose veins of right lower extremity   . Venous stasis of lower extremity    right    Family History  Problem Relation Age of Onset  . Kidney disease Mother        kidney transplant    Past Surgical History:  Procedure Laterality Date  . CHOLECYSTECTOMY N/A 04/18/2015   Procedure: LAPAROSCOPIC CHOLECYSTECTOMY;  Surgeon: Ralene Ok, MD;  Location: [facility];  Service: General;  Laterality: N/A;  . HYSTEROSCOPY W/D&C  12/24/2001   Archie Endo 09/28/2010  . I&D EXTREMITY Right 07/25/2016   Procedure: IRRIGATION AND DEBRIDEMENT RIGHT LEG ULCER, APPLY VERAFLO VAC;  Surgeon:  [name], MD;  Location: [facility];  Service: Orthopedics;  Laterality: Right;  . INCISE AND DRAIN ABCESS Right 07/14/2016  . INCISION AND DRAINAGE Right 09/10/2008   leg:  skin and soft tissue and muscle/notes 09/15/2010  . INCISION AND DRAINAGE Right 01/01/2008   Chronic venous stasis insufficiency ulcer,/notes 09/14/2010/  . INCISION AND DRAINAGE Right 12/5460   calf w/application wound vac/notes 07/20/2010  . INCISION AND DRAINAGE Right 07/25/2016   IRRIGATION AND DEBRIDEMENT RIGHT LEG ULCER,  . LAPAROSCOPIC GASTRIC BYPASS  ~ 2007  . SKIN GRAFT SPLIT THICKNESS LEG / FOOT Right 07/25/2016   LEG  . SKIN SPLIT GRAFT Right 07/27/2016   Procedure: SKIN GRAFT RIGHT LEG WITH THERASKIN APPLICATION;  Surgeon: [name], MD;  Location: [facility];  Service: Orthopedics;  Laterality: Right;  . TONSILLECTOMY     Social History   Occupational History  . Not on file  Tobacco Use  . Smoking status: Never Smoker  . Smokeless tobacco: Never Used  Substance and Sexual Activity  . Alcohol use: No  . Drug use: No  . Sexual activity: Yes    Partners: Male    Birth control/protection: IUD

## 2018-03-29 DIAGNOSIS — R7303 Prediabetes: Secondary | ICD-10-CM | POA: Diagnosis not present

## 2018-03-29 DIAGNOSIS — G629 Polyneuropathy, unspecified: Secondary | ICD-10-CM | POA: Diagnosis not present

## 2018-03-29 DIAGNOSIS — G4733 Obstructive sleep apnea (adult) (pediatric): Secondary | ICD-10-CM | POA: Diagnosis not present

## 2018-03-29 DIAGNOSIS — F329 Major depressive disorder, single episode, unspecified: Secondary | ICD-10-CM | POA: Diagnosis not present

## 2018-03-29 DIAGNOSIS — E559 Vitamin D deficiency, unspecified: Secondary | ICD-10-CM | POA: Diagnosis not present

## 2018-03-29 DIAGNOSIS — R1013 Epigastric pain: Secondary | ICD-10-CM | POA: Diagnosis not present

## 2018-03-29 DIAGNOSIS — I10 Essential (primary) hypertension: Secondary | ICD-10-CM | POA: Diagnosis not present

## 2018-04-02 ENCOUNTER — Other Ambulatory Visit: Payer: Self-pay | Admitting: *Deleted

## 2018-04-02 ENCOUNTER — Inpatient Hospital Stay: Payer: Medicare Other | Admitting: Internal Medicine

## 2018-04-02 ENCOUNTER — Inpatient Hospital Stay: Payer: Medicare Other | Attending: Internal Medicine

## 2018-04-02 ENCOUNTER — Ambulatory Visit (INDEPENDENT_AMBULATORY_CARE_PROVIDER_SITE_OTHER): Payer: Medicare Other | Admitting: Physician Assistant

## 2018-04-02 DIAGNOSIS — E43 Unspecified severe protein-calorie malnutrition: Secondary | ICD-10-CM

## 2018-04-02 DIAGNOSIS — D509 Iron deficiency anemia, unspecified: Secondary | ICD-10-CM

## 2018-04-04 ENCOUNTER — Ambulatory Visit (INDEPENDENT_AMBULATORY_CARE_PROVIDER_SITE_OTHER): Payer: Medicare Other | Admitting: Family

## 2018-04-04 ENCOUNTER — Encounter (INDEPENDENT_AMBULATORY_CARE_PROVIDER_SITE_OTHER): Payer: Self-pay | Admitting: Family

## 2018-04-04 VITALS — Ht 66.0 in | Wt 329.0 lb

## 2018-04-04 DIAGNOSIS — E43 Unspecified severe protein-calorie malnutrition: Secondary | ICD-10-CM

## 2018-04-04 DIAGNOSIS — Z945 Skin transplant status: Secondary | ICD-10-CM

## 2018-04-04 DIAGNOSIS — I87331 Chronic venous hypertension (idiopathic) with ulcer and inflammation of right lower extremity: Secondary | ICD-10-CM

## 2018-04-04 DIAGNOSIS — L97919 Non-pressure chronic ulcer of unspecified part of right lower leg with unspecified severity: Secondary | ICD-10-CM | POA: Diagnosis not present

## 2018-04-10 ENCOUNTER — Ambulatory Visit (INDEPENDENT_AMBULATORY_CARE_PROVIDER_SITE_OTHER): Payer: Medicare Other | Admitting: Physician Assistant

## 2018-04-10 ENCOUNTER — Encounter (INDEPENDENT_AMBULATORY_CARE_PROVIDER_SITE_OTHER): Payer: Self-pay | Admitting: Orthopedic Surgery

## 2018-04-10 VITALS — Ht 66.0 in | Wt 329.0 lb

## 2018-04-10 DIAGNOSIS — L97919 Non-pressure chronic ulcer of unspecified part of right lower leg with unspecified severity: Secondary | ICD-10-CM

## 2018-04-10 DIAGNOSIS — Z945 Skin transplant status: Secondary | ICD-10-CM | POA: Diagnosis not present

## 2018-04-10 DIAGNOSIS — I87331 Chronic venous hypertension (idiopathic) with ulcer and inflammation of right lower extremity: Secondary | ICD-10-CM | POA: Diagnosis not present

## 2018-04-10 MED ORDER — TRAMADOL HCL 50 MG PO TABS: 50.0000 mg | ORAL_TABLET | Freq: Four times a day (QID) | ORAL | 0 refills | Status: DC | PRN

## 2018-04-11 ENCOUNTER — Encounter (INDEPENDENT_AMBULATORY_CARE_PROVIDER_SITE_OTHER): Payer: Self-pay | Admitting: Physician Assistant

## 2018-04-11 NOTE — Progress Notes (Signed)
Office Visit Note   Patient: Alice [name]           Date of Birth: December 20, 1970           MRN: [id number] Visit Date: 04/04/2018              Requested by: Benito Mccreedy, MD [place], [place] [address] PCP: Benito Mccreedy, MD  Chief Complaint  Patient presents with  . Right Leg - Follow-up      HPI: The patient is a 47 year old female who is seen for follow-up of her right lower extremity chronic venous ulceration.  We have been doing pleuro-gel and Dynaflex compression wraps.    Has been having more drainage. More odor. Wound looking bigger than my last exam.   Assessment & Plan: Visit Diagnoses:  1. 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 Hx of skin graft   3. Severe malnutrition ([place])     Plan:  We are going to do another Dynaflex wrap with silver cell in wound bed for drainage and have her follow-up next week or sooner should she have difficulties in the interim.  Follow-Up Instructions: Return in about 2 weeks (around 04/18/2018).   Ortho Exam  Patient is alert, oriented, no adenopathy, well-dressed, normal affect, normal respiratory effort. The wound bed does have several gray areas which appear to be more ischemic.  The rest of the wound bed continues to show good beefy red granulation.  There is some odor and moderate drainage from the area. The dressing was soaked today. there is no sign of cellulitis.  There is a retained staple in the wound bed which the patient will not allow [place] to remove due to complaints of severe pain.  Imaging: No results found.   Labs: Lab Results  Component Value Date   REPTSTATUS 01/04/2017 FINAL 12/30/2016   REPTSTATUS 01/04/2017 FINAL 12/30/2016   GRAMSTAIN  05/17/2013    MODERATE WBC PRESENT, PREDOMINANTLY MONONUCLEAR MODERATE GRAM POSITIVE COCCI IN PAIRS IN CHAINS RARE GRAM NEGATIVE RODS Gram Stain Report Called to,Read Back By and  Verified With: HOPPER,M RN @ [phone] 05/17/13 LEONARD,A   GRAMSTAIN  05/17/2013    MODERATE WBC PRESENT, PREDOMINANTLY MONONUCLEAR MODERATE SQUAMOUS EPITHELIAL CELLS PRESENT MODERATE GRAM POSITIVE COCCI IN PAIRS IN CHAINS RARE GRAM NEGATIVE RODS Performed at [facility]   CULT NO GROWTH 5 DAYS 12/30/2016   CULT NO GROWTH 5 DAYS 12/30/2016   LABORGA ESCHERICHIA COLI (A) 12/29/2016     Lab Results  Component Value Date   ALBUMIN 3.2 (L) 01/29/2018   ALBUMIN 2.2 (L) 01/03/2017   ALBUMIN 2.1 (L) 01/02/2017    Body mass index is 53.1 kg/m.  Orders:  No orders of the defined types were placed in this encounter.  No orders of the defined types were placed in this encounter.    Procedures: No procedures performed  Clinical Data: No additional findings.  ROS:  All other systems negative, except as noted in the HPI. Review of Systems  Constitutional: Negative for chills and fever.  Cardiovascular: Negative for leg swelling.  Skin: Positive for wound. Negative for color change.    Objective: Vital Signs: Ht 5\' 6"  (1.676 m)   Wt (!) 329 lb (149.2 kg)   BMI 53.10 kg/m   Specialty Comments:  No specialty comments available.  PMFS History: Patient Active Problem List   Diagnosis Date Noted  . Iron deficiency anemia 01/29/2018  .  Pulmonary embolus ([place]) 01/09/2018  . Encounter for therapeutic drug monitoring 01/09/2018  . Severe malnutrition ([place]) 08/24/2017  . Acute lower UTI 12/29/2016  . Sepsis ([place]) 12/29/2016  . Acute kidney injury superimposed on CKD ([place]) 12/28/2016  . Atypical chest pain 12/28/2016  . Hx of skin graft 08/01/2016  . 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 ([place]) 07/25/2016  . 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 Trichomonal infection 11/24/2015  . Abdominal pain 05/02/2015  . Symptomatic cholelithiasis 04/16/2015  . Acute bilateral upper  abdominal pain 04/16/2015  . Microcytic anemia 05/18/2013  . Hypotension, unspecified 05/17/2013  . CKD (chronic kidney disease), stage III ([place]) 05/17/2013  . Hypokalemia 05/17/2013  . Anal fissure 02/06/2013  . Anal skin tag 02/06/2013  . ALLERGIC RHINITIS 03/05/2010  . LOW BACK PAIN SYNDROME 12/13/2007  . WOUND INFECTION 10/10/2007  . OBESITY, MORBID 12/02/2006  . HTN (hypertension) 12/02/2006  . History of pulmonary embolism 12/02/2006  . History of DVT (deep vein thrombosis) 12/02/2006  . 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 GASTROESOPHAGEAL REFLUX DISEASE 12/02/2006  . OSA (obstructive sleep apnea) 12/02/2006   Past Medical History:  Diagnosis Date  . Anemia   . Anxiety   . Chronic bronchitis ([place])   . Chronic upper back pain   . Depression   . DVT (deep venous thrombosis) (HCC)    BLE  . GERD (gastroesophageal reflux disease)   . Headache    "weekly" (04/16/2015)  . Hypertension   . Migraine    "monthly" (04/16/2015)  . Obesity   . Pneumonia 2014  . Pulmonary embolism ([place])   . Sinusitis nasal   . Sleep apnea    "I'm suppose to wear a mask but I don't" (04/16/2015)  . Varicose veins of right lower extremity   . Venous stasis of lower extremity    right    Family History  Problem Relation Age of Onset  . Kidney disease Mother        kidney transplant    Past Surgical History:  Procedure Laterality Date  . CHOLECYSTECTOMY N/A 04/18/2015   Procedure: LAPAROSCOPIC CHOLECYSTECTOMY;  Surgeon: Ralene Ok, MD;  Location: [facility];  Service: General;  Laterality: N/A;  . HYSTEROSCOPY W/D&C  12/24/2001   Archie Endo 09/28/2010  . I&D EXTREMITY Right 07/25/2016   Procedure: IRRIGATION AND DEBRIDEMENT RIGHT LEG ULCER, APPLY VERAFLO VAC;  Surgeon: [name], MD;  Location: [facility];  Service: Orthopedics;  Laterality: Right;  . INCISE AND DRAIN ABCESS Right 07/14/2016  . INCISION AND DRAINAGE Right 09/10/2008   leg:  skin and soft tissue and muscle/notes  09/15/2010  . INCISION AND DRAINAGE Right 01/01/2008   Chronic venous stasis insufficiency ulcer,/notes 09/14/2010/  . INCISION AND DRAINAGE Right 01/3902   calf w/application wound vac/notes 07/20/2010  . INCISION AND DRAINAGE Right 07/25/2016   IRRIGATION AND DEBRIDEMENT RIGHT LEG ULCER,  . LAPAROSCOPIC GASTRIC BYPASS  ~ 2007  . SKIN GRAFT SPLIT THICKNESS LEG / FOOT Right 07/25/2016   LEG  . SKIN SPLIT GRAFT Right 07/27/2016   Procedure: SKIN GRAFT RIGHT LEG WITH THERASKIN APPLICATION;  Surgeon: [name], MD;  Location: [facility];  Service: Orthopedics;  Laterality: Right;  . TONSILLECTOMY     Social History   Occupational History  . Not on file  Tobacco Use  . Smoking status: Never Smoker  . Smokeless tobacco: Never Used  Substance and Sexual Activity  .  Alcohol use: No  . Drug use: No  . Sexual activity: Yes    Partners: Male    Birth control/protection: IUD

## 2018-04-11 NOTE — Progress Notes (Signed)
Office Visit Note   Patient: Alice [name]           Date of Birth: May 13, 1971           MRN: [id number] Visit Date: 04/10/2018              Requested by: Benito Mccreedy, MD [address], [place] [address] PCP: Benito Mccreedy, MD  Chief Complaint  Patient presents with  . Right Leg - Follow-up      HPI: The patient is a 47 yo female seen for follow up of her right lower leg chronic ulcer. She has had some silver alginate on the wound bed as she was having more drainage and odor with the Pluro-gel.  She reports moderate pain over the area which is worse with the dressing changes.   Assessment & Plan: Visit Diagnoses:  1. 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 Hx of skin graft     Plan: Continue silver alginate to the wound bed and Dynaflex compression wrap. We discussed referral to Plastic Surgery again today but she is hesitant at this point to pursue new treatments. She will follow up next week.   Follow-Up Instructions: Return in about 1 week (around 04/17/2018).   Ortho Exam  Patient is alert, oriented, no adenopathy, well-dressed, normal affect, normal respiratory effort. The right lower leg wound is slightly larger and appears slightly deeper, but with improved granulation tissue within the wound bed today. No signs of peri wound irritation or infection or cellulitis. Minimal serous appearing drainage.   Imaging: No results found. No images are attached to the encounter.  Labs: Lab Results  Component Value Date   REPTSTATUS 01/04/2017 FINAL 12/30/2016   REPTSTATUS 01/04/2017 FINAL 12/30/2016   GRAMSTAIN  05/17/2013    MODERATE WBC PRESENT, PREDOMINANTLY MONONUCLEAR MODERATE GRAM POSITIVE COCCI IN PAIRS IN CHAINS RARE GRAM NEGATIVE RODS Gram Stain Report Called to,Read Back By and Verified With: HOPPER,M RN @ [phone] 05/17/13 LEONARD,A   GRAMSTAIN  05/17/2013    MODERATE WBC  PRESENT, PREDOMINANTLY MONONUCLEAR MODERATE SQUAMOUS EPITHELIAL CELLS PRESENT MODERATE GRAM POSITIVE COCCI IN PAIRS IN CHAINS RARE GRAM NEGATIVE RODS Performed at [facility]   CULT NO GROWTH 5 DAYS 12/30/2016   CULT NO GROWTH 5 DAYS 12/30/2016   LABORGA ESCHERICHIA COLI (A) 12/29/2016     Lab Results  Component Value Date   ALBUMIN 3.2 (L) 01/29/2018   ALBUMIN 2.2 (L) 01/03/2017   ALBUMIN 2.1 (L) 01/02/2017    Body mass index is 53.1 kg/m.  Orders:  No orders of the defined types were placed in this encounter.  Meds ordered this encounter  Medications  . traMADol (ULTRAM) 50 MG tablet    Sig: Take 1 tablet (50 mg total) by mouth every 6 (six) hours as needed for moderate pain.    Dispense:  30 tablet    Refill:  0     Procedures: No procedures performed  Clinical Data: No additional findings.  ROS:  All other systems negative, except as noted in the HPI. Review of Systems  Objective: Vital Signs: Ht 5\' 6"  (1.676 m)   Wt (!) 329 lb (149.2 kg)   BMI 53.10 kg/m   Specialty Comments:  No specialty comments available.  PMFS History: Patient Active Problem List   Diagnosis Date Noted  . Iron deficiency anemia 01/29/2018  . Pulmonary embolus ([place]) 01/09/2018  . Encounter for therapeutic drug  monitoring 01/09/2018  . Severe malnutrition ([place]) 08/24/2017  . Acute lower UTI 12/29/2016  . Sepsis ([place]) 12/29/2016  . Acute kidney injury superimposed on CKD ([place]) 12/28/2016  . Atypical chest pain 12/28/2016  . Hx of skin graft 08/01/2016  . 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 ([place]) 07/25/2016  . 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 Trichomonal infection 11/24/2015  . Abdominal pain 05/02/2015  . Symptomatic cholelithiasis 04/16/2015  . Acute bilateral upper abdominal pain 04/16/2015  . Microcytic anemia 05/18/2013  . Hypotension, unspecified 05/17/2013  .  CKD (chronic kidney disease), stage III ([place]) 05/17/2013  . Hypokalemia 05/17/2013  . Anal fissure 02/06/2013  . Anal skin tag 02/06/2013  . ALLERGIC RHINITIS 03/05/2010  . LOW BACK PAIN SYNDROME 12/13/2007  . WOUND INFECTION 10/10/2007  . OBESITY, MORBID 12/02/2006  . HTN (hypertension) 12/02/2006  . History of pulmonary embolism 12/02/2006  . History of DVT (deep vein thrombosis) 12/02/2006  . 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 GASTROESOPHAGEAL REFLUX DISEASE 12/02/2006  . OSA (obstructive sleep apnea) 12/02/2006   Past Medical History:  Diagnosis Date  . Anemia   . Anxiety   . Chronic bronchitis ([place])   . Chronic upper back pain   . Depression   . DVT (deep venous thrombosis) (HCC)    BLE  . GERD (gastroesophageal reflux disease)   . Headache    "weekly" (04/16/2015)  . Hypertension   . Migraine    "monthly" (04/16/2015)  . Obesity   . Pneumonia 2014  . Pulmonary embolism ([place])   . Sinusitis nasal   . Sleep apnea    "I'm suppose to wear a mask but I don't" (04/16/2015)  . Varicose veins of right lower extremity   . Venous stasis of lower extremity    right    Family History  Problem Relation Age of Onset  . Kidney disease Mother        kidney transplant    Past Surgical History:  Procedure Laterality Date  . CHOLECYSTECTOMY N/A 04/18/2015   Procedure: LAPAROSCOPIC CHOLECYSTECTOMY;  Surgeon: Ralene Ok, MD;  Location: [facility];  Service: General;  Laterality: N/A;  . HYSTEROSCOPY W/D&C  12/24/2001   Archie Endo 09/28/2010  . I&D EXTREMITY Right 07/25/2016   Procedure: IRRIGATION AND DEBRIDEMENT RIGHT LEG ULCER, APPLY VERAFLO VAC;  Surgeon: [name], MD;  Location: [facility];  Service: Orthopedics;  Laterality: Right;  . INCISE AND DRAIN ABCESS Right 07/14/2016  . INCISION AND DRAINAGE Right 09/10/2008   leg:  skin and soft tissue and muscle/notes 09/15/2010  . INCISION AND DRAINAGE Right 01/01/2008   Chronic venous stasis insufficiency ulcer,/notes  09/14/2010/  . INCISION AND DRAINAGE Right 05/6965   calf w/application wound vac/notes 07/20/2010  . INCISION AND DRAINAGE Right 07/25/2016   IRRIGATION AND DEBRIDEMENT RIGHT LEG ULCER,  . LAPAROSCOPIC GASTRIC BYPASS  ~ 2007  . SKIN GRAFT SPLIT THICKNESS LEG / FOOT Right 07/25/2016   LEG  . SKIN SPLIT GRAFT Right 07/27/2016   Procedure: SKIN GRAFT RIGHT LEG WITH THERASKIN APPLICATION;  Surgeon: [name], MD;  Location: [facility];  Service: Orthopedics;  Laterality: Right;  . TONSILLECTOMY     Social History   Occupational History  . Not on file  Tobacco Use  . Smoking status: Never Smoker  . Smokeless tobacco: Never Used  Substance and Sexual Activity  . Alcohol use: No  . Drug use: No  .  Sexual activity: Yes    Partners: Male    Birth control/protection: IUD

## 2018-04-17 ENCOUNTER — Encounter (INDEPENDENT_AMBULATORY_CARE_PROVIDER_SITE_OTHER): Payer: Self-pay | Admitting: Physician Assistant

## 2018-04-17 ENCOUNTER — Ambulatory Visit (INDEPENDENT_AMBULATORY_CARE_PROVIDER_SITE_OTHER): Payer: Medicare Other | Admitting: Physician Assistant

## 2018-04-17 VITALS — Ht 66.0 in | Wt 329.0 lb

## 2018-04-17 DIAGNOSIS — E440 Moderate protein-calorie malnutrition: Secondary | ICD-10-CM

## 2018-04-17 DIAGNOSIS — L97919 Non-pressure chronic ulcer of unspecified part of right lower leg with unspecified severity: Secondary | ICD-10-CM | POA: Diagnosis not present

## 2018-04-17 DIAGNOSIS — I87331 Chronic venous hypertension (idiopathic) with ulcer and inflammation of right lower extremity: Secondary | ICD-10-CM

## 2018-04-17 NOTE — Progress Notes (Signed)
Office Visit Note   Patient: Alice [name]           Date of Birth: 01-Apr-1971           MRN: [id number] Visit Date: 04/17/2018              Requested by: Benito Mccreedy, MD [address], [place] [address] PCP: Benito Mccreedy, MD  Chief Complaint  Patient presents with  . Right Leg - Follow-up    RLE ulcer      HPI: The patient is a 47 year old female who is seen for follow-up of her chronic right lower extremity venous stasis ulceration.  She has gone back to using silver cell to the wound bed due to increased drainage and concerns over the odor when using pleuro-gel.  We have discussed seeing plastic surgery but at this point she is not willing to pursue this.  She continues on protein supplementation and reports that her diet is much improved from previous and she is losing a little weight.  Assessment & Plan: Visit Diagnoses:  1. 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 mild protein calorie malnutrition     Plan: Recommended continue silver cell to the wound bed and apply a Dynaflex layered compression dressing per usual.  She will follow-up in 1 week for recheck or sooner should she have difficulties in the interim.    Follow-Up Instructions: Return in about 1 week (around 04/24/2018).   Ortho Exam  Patient is alert, oriented, no adenopathy, well-dressed, normal affect, normal respiratory effort. The right lower leg ulcer is stable in size.  She does have some increased fibrinous yellow tissue but some pink granulation buds within the area.  There are no signs of periwound irritation or cellulitis or infection currently.  There is moderate drainage and odor.  She has palpable pedal pulses.  She continues to have a staple within the wound bed which she does not want to have removed.  Imaging: No results found.   Labs: Lab Results  Component Value Date   REPTSTATUS 01/04/2017 FINAL  12/30/2016   REPTSTATUS 01/04/2017 FINAL 12/30/2016   GRAMSTAIN  05/17/2013    MODERATE WBC PRESENT, PREDOMINANTLY MONONUCLEAR MODERATE GRAM POSITIVE COCCI IN PAIRS IN CHAINS RARE GRAM NEGATIVE RODS Gram Stain Report Called to,Read Back By and Verified With: HOPPER,M RN @ [phone] 05/17/13 LEONARD,A   GRAMSTAIN  05/17/2013    MODERATE WBC PRESENT, PREDOMINANTLY MONONUCLEAR MODERATE SQUAMOUS EPITHELIAL CELLS PRESENT MODERATE GRAM POSITIVE COCCI IN PAIRS IN CHAINS RARE GRAM NEGATIVE RODS Performed at [facility]   CULT NO GROWTH 5 DAYS 12/30/2016   CULT NO GROWTH 5 DAYS 12/30/2016   LABORGA ESCHERICHIA COLI (A) 12/29/2016     Lab Results  Component Value Date   ALBUMIN 3.2 (L) 01/29/2018   ALBUMIN 2.2 (L) 01/03/2017   ALBUMIN 2.1 (L) 01/02/2017    Body mass index is 53.1 kg/m.  Orders:  No orders of the defined types were placed in this encounter.  No orders of the defined types were placed in this encounter.    Procedures: No procedures performed  Clinical Data: No additional findings.  ROS:  All other systems negative, except as noted in the HPI. Review of Systems  Objective: Vital Signs: Ht 5\' 6"  (1.676 m)   Wt (!) 329 lb (149.2 kg)   BMI 53.10 kg/m   Specialty Comments:  No specialty comments available.  PMFS History:  Patient Active Problem List   Diagnosis Date Noted  . Iron deficiency anemia 01/29/2018  . Pulmonary embolus ([place]) 01/09/2018  . Encounter for therapeutic drug monitoring 01/09/2018  . Severe malnutrition ([place]) 08/24/2017  . Acute lower UTI 12/29/2016  . Sepsis ([place]) 12/29/2016  . Acute kidney injury superimposed on CKD ([place]) 12/28/2016  . Atypical chest pain 12/28/2016  . Hx of skin graft 08/01/2016  . 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 ([place]) 07/25/2016  . 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 Trichomonal infection 11/24/2015  .  Abdominal pain 05/02/2015  . Symptomatic cholelithiasis 04/16/2015  . Acute bilateral upper abdominal pain 04/16/2015  . Microcytic anemia 05/18/2013  . Hypotension, unspecified 05/17/2013  . CKD (chronic kidney disease), stage III ([place]) 05/17/2013  . Hypokalemia 05/17/2013  . Anal fissure 02/06/2013  . Anal skin tag 02/06/2013  . ALLERGIC RHINITIS 03/05/2010  . LOW BACK PAIN SYNDROME 12/13/2007  . WOUND INFECTION 10/10/2007  . OBESITY, MORBID 12/02/2006  . HTN (hypertension) 12/02/2006  . History of pulmonary embolism 12/02/2006  . History of DVT (deep vein thrombosis) 12/02/2006  . 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 GASTROESOPHAGEAL REFLUX DISEASE 12/02/2006  . OSA (obstructive sleep apnea) 12/02/2006   Past Medical History:  Diagnosis Date  . Anemia   . Anxiety   . Chronic bronchitis ([place])   . Chronic upper back pain   . Depression   . DVT (deep venous thrombosis) (HCC)    BLE  . GERD (gastroesophageal reflux disease)   . Headache    "weekly" (04/16/2015)  . Hypertension   . Migraine    "monthly" (04/16/2015)  . Obesity   . Pneumonia 2014  . Pulmonary embolism ([place])   . Sinusitis nasal   . Sleep apnea    "I'm suppose to wear a mask but I don't" (04/16/2015)  . Varicose veins of right lower extremity   . Venous stasis of lower extremity    right    Family History  Problem Relation Age of Onset  . Kidney disease Mother        kidney transplant    Past Surgical History:  Procedure Laterality Date  . CHOLECYSTECTOMY N/A 04/18/2015   Procedure: LAPAROSCOPIC CHOLECYSTECTOMY;  Surgeon: Ralene Ok, MD;  Location: [facility];  Service: General;  Laterality: N/A;  . HYSTEROSCOPY W/D&C  12/24/2001   Archie Endo 09/28/2010  . I&D EXTREMITY Right 07/25/2016   Procedure: IRRIGATION AND DEBRIDEMENT RIGHT LEG ULCER, APPLY VERAFLO VAC;  Surgeon: [name], MD;  Location: [facility];  Service: Orthopedics;  Laterality: Right;  . INCISE AND DRAIN ABCESS Right  07/14/2016  . INCISION AND DRAINAGE Right 09/10/2008   leg:  skin and soft tissue and muscle/notes 09/15/2010  . INCISION AND DRAINAGE Right 01/01/2008   Chronic venous stasis insufficiency ulcer,/notes 09/14/2010/  . INCISION AND DRAINAGE Right 08/1960   calf w/application wound vac/notes 07/20/2010  . INCISION AND DRAINAGE Right 07/25/2016   IRRIGATION AND DEBRIDEMENT RIGHT LEG ULCER,  . LAPAROSCOPIC GASTRIC BYPASS  ~ 2007  . SKIN GRAFT SPLIT THICKNESS LEG / FOOT Right 07/25/2016   LEG  . SKIN SPLIT GRAFT Right 07/27/2016   Procedure: SKIN GRAFT RIGHT LEG WITH THERASKIN APPLICATION;  Surgeon: [name], MD;  Location: [facility];  Service: Orthopedics;  Laterality: Right;  . TONSILLECTOMY     Social History   Occupational History  . Not on file  Tobacco Use  .  Smoking status: Never Smoker  . Smokeless tobacco: Never Used  Substance and Sexual Activity  . Alcohol use: No  . Drug use: No  . Sexual activity: Yes    Partners: Male    Birth control/protection: IUD

## 2018-04-24 ENCOUNTER — Encounter (INDEPENDENT_AMBULATORY_CARE_PROVIDER_SITE_OTHER): Payer: Self-pay | Admitting: Physician Assistant

## 2018-04-24 ENCOUNTER — Ambulatory Visit (INDEPENDENT_AMBULATORY_CARE_PROVIDER_SITE_OTHER): Payer: Medicare Other | Admitting: Physician Assistant

## 2018-04-24 VITALS — Ht 66.0 in | Wt 329.0 lb

## 2018-04-24 DIAGNOSIS — Z945 Skin transplant status: Secondary | ICD-10-CM

## 2018-04-24 DIAGNOSIS — L97919 Non-pressure chronic ulcer of unspecified part of right lower leg with unspecified severity: Secondary | ICD-10-CM

## 2018-04-24 DIAGNOSIS — E440 Moderate protein-calorie malnutrition: Secondary | ICD-10-CM | POA: Diagnosis not present

## 2018-04-24 DIAGNOSIS — I87331 Chronic venous hypertension (idiopathic) with ulcer and inflammation of right lower extremity: Secondary | ICD-10-CM | POA: Diagnosis not present

## 2018-04-24 MED ORDER — TRAMADOL HCL 50 MG PO TABS: 50.0000 mg | ORAL_TABLET | Freq: Four times a day (QID) | ORAL | 0 refills | Status: DC | PRN

## 2018-04-24 NOTE — Progress Notes (Signed)
Office Visit Note   Patient: Alice [name]           Date of Birth: 07-18-1970           MRN: [id number] Visit Date: 04/24/2018              Requested by: Benito Mccreedy, MD [address], [place] [address] PCP: Benito Mccreedy, MD  Chief Complaint  Patient presents with  . Right Leg - Follow-up      HPI: The patient is a 47 year old woman who is seen for follow-up of her right lower leg chronic venous ulceration. We have been using silver alginate for the past couple of weeks due to increased drainage and this does seem to be helping.  She is coming in once a week for [place] to do a compression wrap and then doing a compression wrap on her own at least once weekly otherwise.  She reports she is trying to lose some weight and Dr. Sharol Given did discuss the importance of exercise as well with the patient to aid in her weight loss. Assessment & Plan: Visit Diagnoses:  1. 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 mild protein calorie malnutrition   3. Hx of skin graft     Plan: We are going to continue silver alginate to the wound bed and then Dynaflex layered compression wrap per usual.  She will follow-up in 1 week or sooner should she have difficulties in the interim.  Follow-Up Instructions: Return in about 1 week (around 05/01/2018).   Ortho Exam  Patient is alert, oriented, no adenopathy, well-dressed, normal affect, normal respiratory effort. The right lower leg wound is approximately 8 x 15 x 0.3 cm and 60% yellow fibrinous tissue and 40% granulation.  She reports pain with attempts to debride the wound bed.  She continues to have a retained staple within the wound bed which she does not want removed secondary to complaints of pain.  Her right lower leg edema continues to be improved overall.    Imaging: No results found. No images are attached to the encounter.  Labs: Lab Results  Component  Value Date   REPTSTATUS 01/04/2017 FINAL 12/30/2016   REPTSTATUS 01/04/2017 FINAL 12/30/2016   GRAMSTAIN  05/17/2013    MODERATE WBC PRESENT, PREDOMINANTLY MONONUCLEAR MODERATE GRAM POSITIVE COCCI IN PAIRS IN CHAINS RARE GRAM NEGATIVE RODS Gram Stain Report Called to,Read Back By and Verified With: HOPPER,M RN @ [phone] 05/17/13 LEONARD,A   GRAMSTAIN  05/17/2013    MODERATE WBC PRESENT, PREDOMINANTLY MONONUCLEAR MODERATE SQUAMOUS EPITHELIAL CELLS PRESENT MODERATE GRAM POSITIVE COCCI IN PAIRS IN CHAINS RARE GRAM NEGATIVE RODS Performed at [facility]   CULT NO GROWTH 5 DAYS 12/30/2016   CULT NO GROWTH 5 DAYS 12/30/2016   LABORGA ESCHERICHIA COLI (A) 12/29/2016     Lab Results  Component Value Date   ALBUMIN 3.2 (L) 01/29/2018   ALBUMIN 2.2 (L) 01/03/2017   ALBUMIN 2.1 (L) 01/02/2017    Body mass index is 53.1 kg/m.  Orders:  No orders of the defined types were placed in this encounter.  Meds ordered this encounter  Medications  . traMADol (ULTRAM) 50 MG tablet    Sig: Take 1 tablet (50 mg total) by mouth every 6 (six) hours as needed for moderate pain.    Dispense:  30 tablet    Refill:  0     Procedures: No procedures performed  Clinical  Data: No additional findings.  ROS:  All other systems negative, except as noted in the HPI. Review of Systems  Objective: Vital Signs: Ht 5\' 6"  (1.676 m)   Wt (!) 329 lb (149.2 kg)   BMI 53.10 kg/m   Specialty Comments:  No specialty comments available.  PMFS History: Patient Active Problem List   Diagnosis Date Noted  . Iron deficiency anemia 01/29/2018  . Pulmonary embolus ([place]) 01/09/2018  . Encounter for therapeutic drug monitoring 01/09/2018  . Severe malnutrition ([place]) 08/24/2017  . Acute lower UTI 12/29/2016  . Sepsis ([place]) 12/29/2016  . Acute kidney injury superimposed on CKD ([place]) 12/28/2016  . Atypical chest pain 12/28/2016  . Hx of skin graft 08/01/2016  . Venous ulcer of right lower extremity  with varicose veins ([place]) 07/25/2016  . 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 Trichomonal infection 11/24/2015  . Abdominal pain 05/02/2015  . Symptomatic cholelithiasis 04/16/2015  . Acute bilateral upper abdominal pain 04/16/2015  . Microcytic anemia 05/18/2013  . Hypotension, unspecified 05/17/2013  . CKD (chronic kidney disease), stage III ([place]) 05/17/2013  . Hypokalemia 05/17/2013  . Anal fissure 02/06/2013  . Anal skin tag 02/06/2013  . ALLERGIC RHINITIS 03/05/2010  . LOW BACK PAIN SYNDROME 12/13/2007  . WOUND INFECTION 10/10/2007  . OBESITY, MORBID 12/02/2006  . HTN (hypertension) 12/02/2006  . History of pulmonary embolism 12/02/2006  . History of DVT (deep vein thrombosis) 12/02/2006  . 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 GASTROESOPHAGEAL REFLUX DISEASE 12/02/2006  . OSA (obstructive sleep apnea) 12/02/2006   Past Medical History:  Diagnosis Date  . Anemia   . Anxiety   . Chronic bronchitis ([place])   . Chronic upper back pain   . Depression   . DVT (deep venous thrombosis) (HCC)    BLE  . GERD (gastroesophageal reflux disease)   . Headache    "weekly" (04/16/2015)  . Hypertension   . Migraine    "monthly" (04/16/2015)  . Obesity   . Pneumonia 2014  . Pulmonary embolism ([place])   . Sinusitis nasal   . Sleep apnea    "I'm suppose to wear a mask but I don't" (04/16/2015)  . Varicose veins of right lower extremity   . Venous stasis of lower extremity    right    Family History  Problem Relation Age of Onset  . Kidney disease Mother        kidney transplant    Past Surgical History:  Procedure Laterality Date  . CHOLECYSTECTOMY N/A 04/18/2015   Procedure: LAPAROSCOPIC CHOLECYSTECTOMY;  Surgeon: Ralene Ok, MD;  Location: [facility];  Service: General;  Laterality: N/A;  . HYSTEROSCOPY W/D&C  12/24/2001   Archie Endo 09/28/2010  . I&D EXTREMITY Right 07/25/2016   Procedure:  IRRIGATION AND DEBRIDEMENT RIGHT LEG ULCER, APPLY VERAFLO VAC;  Surgeon: [name], MD;  Location: [facility];  Service: Orthopedics;  Laterality: Right;  . INCISE AND DRAIN ABCESS Right 07/14/2016  . INCISION AND DRAINAGE Right 09/10/2008   leg:  skin and soft tissue and muscle/notes 09/15/2010  . INCISION AND DRAINAGE Right 01/01/2008   Chronic venous stasis insufficiency ulcer,/notes 09/14/2010/  . INCISION AND DRAINAGE Right 09/7844   calf w/application wound vac/notes 07/20/2010  . INCISION AND DRAINAGE Right 07/25/2016   IRRIGATION AND DEBRIDEMENT RIGHT LEG ULCER,  . LAPAROSCOPIC GASTRIC BYPASS  ~ 2007  . SKIN GRAFT SPLIT THICKNESS LEG / FOOT Right 07/25/2016   LEG  .  SKIN SPLIT GRAFT Right 07/27/2016   Procedure: SKIN GRAFT RIGHT LEG WITH THERASKIN APPLICATION;  Surgeon: [name], MD;  Location: [facility];  Service: Orthopedics;  Laterality: Right;  . TONSILLECTOMY     Social History   Occupational History  . Not on file  Tobacco Use  . Smoking status: Never Smoker  . Smokeless tobacco: Never Used  Substance and Sexual Activity  . Alcohol use: No  . Drug use: No  . Sexual activity: Yes    Partners: Male    Birth control/protection: IUD

## 2018-04-25 ENCOUNTER — Encounter (INDEPENDENT_AMBULATORY_CARE_PROVIDER_SITE_OTHER): Payer: Self-pay | Admitting: Physician Assistant

## 2018-04-26 DIAGNOSIS — E559 Vitamin D deficiency, unspecified: Secondary | ICD-10-CM | POA: Diagnosis not present

## 2018-04-26 DIAGNOSIS — I10 Essential (primary) hypertension: Secondary | ICD-10-CM | POA: Diagnosis not present

## 2018-04-26 DIAGNOSIS — G4733 Obstructive sleep apnea (adult) (pediatric): Secondary | ICD-10-CM | POA: Diagnosis not present

## 2018-04-26 DIAGNOSIS — G629 Polyneuropathy, unspecified: Secondary | ICD-10-CM | POA: Diagnosis not present

## 2018-04-26 DIAGNOSIS — R1013 Epigastric pain: Secondary | ICD-10-CM | POA: Diagnosis not present

## 2018-04-26 DIAGNOSIS — R7303 Prediabetes: Secondary | ICD-10-CM | POA: Diagnosis not present

## 2018-04-26 DIAGNOSIS — F329 Major depressive disorder, single episode, unspecified: Secondary | ICD-10-CM | POA: Diagnosis not present

## 2018-04-27 ENCOUNTER — Ambulatory Visit (INDEPENDENT_AMBULATORY_CARE_PROVIDER_SITE_OTHER): Payer: Medicare Other | Admitting: *Deleted

## 2018-04-27 DIAGNOSIS — Z5181 Encounter for therapeutic drug level monitoring: Secondary | ICD-10-CM | POA: Diagnosis not present

## 2018-04-27 DIAGNOSIS — Z86718 Personal history of other venous thrombosis and embolism: Secondary | ICD-10-CM

## 2018-04-27 DIAGNOSIS — I2699 Other pulmonary embolism without acute cor pulmonale: Secondary | ICD-10-CM | POA: Diagnosis not present

## 2018-04-27 LAB — POCT INR: INR: 3.2 — AB (ref 2.0–3.0)

## 2018-04-27 NOTE — Patient Instructions (Signed)
Description   Today take 1 tablet then continue taking 3 tablets daily except 4 tablets on Wednesdays and Sundays.  Recheck INR in 4 weeks.  [facility] [phone] Main [phone]

## 2018-05-01 ENCOUNTER — Ambulatory Visit (INDEPENDENT_AMBULATORY_CARE_PROVIDER_SITE_OTHER): Payer: Medicare Other | Admitting: Physician Assistant

## 2018-05-01 ENCOUNTER — Encounter (INDEPENDENT_AMBULATORY_CARE_PROVIDER_SITE_OTHER): Payer: Self-pay | Admitting: Physician Assistant

## 2018-05-01 ENCOUNTER — Other Ambulatory Visit (INDEPENDENT_AMBULATORY_CARE_PROVIDER_SITE_OTHER): Payer: Self-pay

## 2018-05-01 VITALS — Ht 66.0 in | Wt 329.0 lb

## 2018-05-01 DIAGNOSIS — L97919 Non-pressure chronic ulcer of unspecified part of right lower leg with unspecified severity: Secondary | ICD-10-CM | POA: Diagnosis not present

## 2018-05-01 DIAGNOSIS — I87331 Chronic venous hypertension (idiopathic) with ulcer and inflammation of right lower extremity: Secondary | ICD-10-CM

## 2018-05-01 DIAGNOSIS — Z6841 Body Mass Index (BMI) 40.0 and over, adult: Secondary | ICD-10-CM

## 2018-05-01 DIAGNOSIS — E440 Moderate protein-calorie malnutrition: Secondary | ICD-10-CM

## 2018-05-01 NOTE — Progress Notes (Signed)
Office Visit Note   Patient: Alice [name]           Date of Birth: June 27, 1970           MRN: [id number] Visit Date: 05/01/2018              Requested by: Benito Mccreedy, MD [place], [place] [address] PCP: Benito Mccreedy, MD  Chief Complaint  Patient presents with  . Right Leg - Follow-up      HPI: The patient is a 47 year old woman who is seen for follow-up of her right lower leg chronic venous ulceration.  We have been using silver alginate to the wound bed and she has improved healthier appearing granulation tissue.  She reports she saw her primary care physician and has lost 20 pounds.  Assessment & Plan: Visit Diagnoses:  1. 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 mild protein calorie malnutrition     Plan: Continue silver cell to the wound bed and application of Dynaflex compression wrap.  The patient will follow-up next week.  Follow-Up Instructions: Return in about 1 week (around 05/08/2018).   Ortho Exam  Patient is alert, oriented, no adenopathy, well-dressed, normal affect, normal respiratory effort. The right lower leg edema remains improved.  The wound bed has healthy appearing pink granulation with less yellow fibrinous looking tissue.  There are no signs of cellulitis or infection no periwound irritation.  Imaging: No results found.   Labs: Lab Results  Component Value Date   REPTSTATUS 01/04/2017 FINAL 12/30/2016   REPTSTATUS 01/04/2017 FINAL 12/30/2016   GRAMSTAIN  05/17/2013    MODERATE WBC PRESENT, PREDOMINANTLY MONONUCLEAR MODERATE GRAM POSITIVE COCCI IN PAIRS IN CHAINS RARE GRAM NEGATIVE RODS Gram Stain Report Called to,Read Back By and Verified With: HOPPER,M RN @ [phone] 05/17/13 LEONARD,A   GRAMSTAIN  05/17/2013    MODERATE WBC PRESENT, PREDOMINANTLY MONONUCLEAR MODERATE SQUAMOUS EPITHELIAL CELLS PRESENT MODERATE GRAM POSITIVE COCCI IN PAIRS IN CHAINS  RARE GRAM NEGATIVE RODS Performed at [facility]   CULT NO GROWTH 5 DAYS 12/30/2016   CULT NO GROWTH 5 DAYS 12/30/2016   LABORGA ESCHERICHIA COLI (A) 12/29/2016     Lab Results  Component Value Date   ALBUMIN 3.2 (L) 01/29/2018   ALBUMIN 2.2 (L) 01/03/2017   ALBUMIN 2.1 (L) 01/02/2017    Body mass index is 53.1 kg/m.  Orders:  No orders of the defined types were placed in this encounter.  No orders of the defined types were placed in this encounter.    Procedures: No procedures performed  Clinical Data: No additional findings.  ROS:  All other systems negative, except as noted in the HPI. Review of Systems  Objective: Vital Signs: Ht 5\' 6"  (1.676 m)   Wt (!) 329 lb (149.2 kg)   BMI 53.10 kg/m   Specialty Comments:  No specialty comments available.  PMFS History: Patient Active Problem List   Diagnosis Date Noted  . Iron deficiency anemia 01/29/2018  . Pulmonary embolus ([place]) 01/09/2018  . Encounter for therapeutic drug monitoring 01/09/2018  . Severe malnutrition ([place]) 08/24/2017  . Acute lower UTI 12/29/2016  . Sepsis ([place]) 12/29/2016  . Acute kidney injury superimposed on CKD ([place]) 12/28/2016  . Atypical chest pain 12/28/2016  . Hx of skin graft 08/01/2016  . 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 ([place]) 07/25/2016  . Idiopathic chronic venous hypertension of right lower extremity with  ulcer and inflammation ([place]) 07/05/2016  . Trichomonal infection 11/24/2015  . Abdominal pain 05/02/2015  . Symptomatic cholelithiasis 04/16/2015  . Acute bilateral upper abdominal pain 04/16/2015  . Microcytic anemia 05/18/2013  . Hypotension, unspecified 05/17/2013  . CKD (chronic kidney disease), stage III ([place]) 05/17/2013  . Hypokalemia 05/17/2013  . Anal fissure 02/06/2013  . Anal skin tag 02/06/2013  . ALLERGIC RHINITIS 03/05/2010  . LOW BACK PAIN SYNDROME 12/13/2007  . WOUND INFECTION 10/10/2007  . OBESITY, MORBID 12/02/2006  .  HTN (hypertension) 12/02/2006  . History of pulmonary embolism 12/02/2006  . History of DVT (deep vein thrombosis) 12/02/2006  . 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 GASTROESOPHAGEAL REFLUX DISEASE 12/02/2006  . OSA (obstructive sleep apnea) 12/02/2006   Past Medical History:  Diagnosis Date  . Anemia   . Anxiety   . Chronic bronchitis ([place])   . Chronic upper back pain   . Depression   . DVT (deep venous thrombosis) (HCC)    BLE  . GERD (gastroesophageal reflux disease)   . Headache    "weekly" (04/16/2015)  . Hypertension   . Migraine    "monthly" (04/16/2015)  . Obesity   . Pneumonia 2014  . Pulmonary embolism ([place])   . Sinusitis nasal   . Sleep apnea    "I'm suppose to wear a mask but I don't" (04/16/2015)  . Varicose veins of right lower extremity   . Venous stasis of lower extremity    right    Family History  Problem Relation Age of Onset  . Kidney disease Mother        kidney transplant    Past Surgical History:  Procedure Laterality Date  . CHOLECYSTECTOMY N/A 04/18/2015   Procedure: LAPAROSCOPIC CHOLECYSTECTOMY;  Surgeon: Ralene Ok, MD;  Location: [facility];  Service: General;  Laterality: N/A;  . HYSTEROSCOPY W/D&C  12/24/2001   Archie Endo 09/28/2010  . I&D EXTREMITY Right 07/25/2016   Procedure: IRRIGATION AND DEBRIDEMENT RIGHT LEG ULCER, APPLY VERAFLO VAC;  Surgeon: [name], MD;  Location: [facility];  Service: Orthopedics;  Laterality: Right;  . INCISE AND DRAIN ABCESS Right 07/14/2016  . INCISION AND DRAINAGE Right 09/10/2008   leg:  skin and soft tissue and muscle/notes 09/15/2010  . INCISION AND DRAINAGE Right 01/01/2008   Chronic venous stasis insufficiency ulcer,/notes 09/14/2010/  . INCISION AND DRAINAGE Right 09/1023   calf w/application wound vac/notes 07/20/2010  . INCISION AND DRAINAGE Right 07/25/2016   IRRIGATION AND DEBRIDEMENT RIGHT LEG ULCER,  . LAPAROSCOPIC GASTRIC BYPASS  ~ 2007  . SKIN GRAFT SPLIT THICKNESS LEG / FOOT Right  07/25/2016   LEG  . SKIN SPLIT GRAFT Right 07/27/2016   Procedure: SKIN GRAFT RIGHT LEG WITH THERASKIN APPLICATION;  Surgeon: [name], MD;  Location: [facility];  Service: Orthopedics;  Laterality: Right;  . TONSILLECTOMY     Social History   Occupational History  . Not on file  Tobacco Use  . Smoking status: Never Smoker  . Smokeless tobacco: Never Used  Substance and Sexual Activity  . Alcohol use: No  . Drug use: No  . Sexual activity: Yes    Partners: Male    Birth control/protection: I.U.D.

## 2018-05-01 NOTE — Progress Notes (Signed)
bariatric

## 2018-05-22 ENCOUNTER — Ambulatory Visit (INDEPENDENT_AMBULATORY_CARE_PROVIDER_SITE_OTHER): Payer: Medicare Other | Admitting: Orthopedic Surgery

## 2018-05-22 ENCOUNTER — Encounter (INDEPENDENT_AMBULATORY_CARE_PROVIDER_SITE_OTHER): Payer: Self-pay | Admitting: Orthopedic Surgery

## 2018-05-22 VITALS — Ht 66.0 in | Wt 329.0 lb

## 2018-05-22 DIAGNOSIS — I87331 Chronic venous hypertension (idiopathic) with ulcer and inflammation of right lower extremity: Secondary | ICD-10-CM

## 2018-05-22 DIAGNOSIS — L97919 Non-pressure chronic ulcer of unspecified part of right lower leg with unspecified severity: Secondary | ICD-10-CM

## 2018-05-22 DIAGNOSIS — Z6841 Body Mass Index (BMI) 40.0 and over, adult: Secondary | ICD-10-CM

## 2018-05-22 MED ORDER — TRAMADOL HCL 50 MG PO TABS: 50.0000 mg | ORAL_TABLET | Freq: Four times a day (QID) | ORAL | 0 refills | Status: DC | PRN

## 2018-05-22 NOTE — Progress Notes (Signed)
Office Visit Note   Patient: Alice [name]           Date of Birth: July 17, 1970           MRN: [id number] Visit Date: 05/22/2018              Requested by: Benito Mccreedy, MD [address] [place], [place] [address] PCP: Benito Mccreedy, MD  Chief Complaint  Patient presents with  . Right Leg - Follow-up      HPI: The patient is a 48 yo woman who is seen for follow up of her right lower leg chronic venous ulcer.  We have been using silver alginate to the wound bed and Dynaflex dressings for compression. She has been up on her leg more and has increased swelling.    Assessment & Plan: Visit Diagnoses:  1. 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 BMI 50.0-59.9, adult (HCC)     Plan: Continue silver alginate to the wound bed and Dynaflex compression wrap. She will follow up in 1 week.   Follow-Up Instructions: Return in about 2 weeks (around 06/05/2018).   Ortho Exam  Patient is alert, oriented, no adenopathy, well-dressed, normal affect, normal respiratory effort. Right lower leg ulcer is granulating inward from the borders. There is significant improvement in the depth . Overall size ~ 8.5 x 14 x 0.1 cm with good pink granulation and less yellow fibrinous tissue present. No signs of infection or cellulitis. No peri wound irritation.      Imaging: No results found. No images are attached to the encounter.  Labs: Lab Results  Component Value Date   REPTSTATUS 01/04/2017 FINAL 12/30/2016   REPTSTATUS 01/04/2017 FINAL 12/30/2016   GRAMSTAIN  05/17/2013    MODERATE WBC PRESENT, PREDOMINANTLY MONONUCLEAR MODERATE GRAM POSITIVE COCCI IN PAIRS IN CHAINS RARE GRAM NEGATIVE RODS Gram Stain Report Called to,Read Back By and Verified With: HOPPER,M RN @ [phone] 05/17/13 LEONARD,A   GRAMSTAIN  05/17/2013    MODERATE WBC PRESENT, PREDOMINANTLY MONONUCLEAR MODERATE SQUAMOUS EPITHELIAL CELLS PRESENT MODERATE  GRAM POSITIVE COCCI IN PAIRS IN CHAINS RARE GRAM NEGATIVE RODS Performed at [facility]   CULT NO GROWTH 5 DAYS 12/30/2016   CULT NO GROWTH 5 DAYS 12/30/2016   LABORGA ESCHERICHIA COLI (A) 12/29/2016     Lab Results  Component Value Date   ALBUMIN 3.2 (L) 01/29/2018   ALBUMIN 2.2 (L) 01/03/2017   ALBUMIN 2.1 (L) 01/02/2017    Body mass index is 53.1 kg/m.  Orders:  No orders of the defined types were placed in this encounter.  Meds ordered this encounter  Medications  . traMADol (ULTRAM) 50 MG tablet    Sig: Take 1 tablet (50 mg total) by mouth every 6 (six) hours as needed for moderate pain.    Dispense:  30 tablet    Refill:  0     Procedures: No procedures performed  Clinical Data: No additional findings.  ROS:  All other systems negative, except as noted in the HPI. Review of Systems  Objective: Vital Signs: Ht 5\' 6"  (1.676 m)   Wt (!) 329 lb (149.2 kg)   BMI 53.10 kg/m   Specialty Comments:  No specialty comments available.  PMFS History: Patient Active Problem List   Diagnosis Date Noted  . Iron deficiency anemia 01/29/2018  . Pulmonary embolus ([place]) 01/09/2018  . Encounter for therapeutic drug monitoring 01/09/2018  . Severe malnutrition ([place]) 08/24/2017  .  Acute lower UTI 12/29/2016  . Sepsis ([place]) 12/29/2016  . Acute kidney injury superimposed on CKD ([place]) 12/28/2016  . Atypical chest pain 12/28/2016  . Hx of skin graft 08/01/2016  . 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 ([place]) 07/25/2016  . 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 Trichomonal infection 11/24/2015  . Abdominal pain 05/02/2015  . Symptomatic cholelithiasis 04/16/2015  . Acute bilateral upper abdominal pain 04/16/2015  . Microcytic anemia 05/18/2013  . Hypotension, unspecified 05/17/2013  . CKD (chronic kidney disease), stage III ([place]) 05/17/2013  . Hypokalemia 05/17/2013  .  Anal fissure 02/06/2013  . Anal skin tag 02/06/2013  . ALLERGIC RHINITIS 03/05/2010  . LOW BACK PAIN SYNDROME 12/13/2007  . WOUND INFECTION 10/10/2007  . OBESITY, MORBID 12/02/2006  . HTN (hypertension) 12/02/2006  . History of pulmonary embolism 12/02/2006  . History of DVT (deep vein thrombosis) 12/02/2006  . 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 GASTROESOPHAGEAL REFLUX DISEASE 12/02/2006  . OSA (obstructive sleep apnea) 12/02/2006   Past Medical History:  Diagnosis Date  . Anemia   . Anxiety   . Chronic bronchitis ([place])   . Chronic upper back pain   . Depression   . DVT (deep venous thrombosis) (HCC)    BLE  . GERD (gastroesophageal reflux disease)   . Headache    "weekly" (04/16/2015)  . Hypertension   . Migraine    "monthly" (04/16/2015)  . Obesity   . Pneumonia 2014  . Pulmonary embolism ([place])   . Sinusitis nasal   . Sleep apnea    "I'm suppose to wear a mask but I don't" (04/16/2015)  . Varicose veins of right lower extremity   . Venous stasis of lower extremity    right    Family History  Problem Relation Age of Onset  . Kidney disease Mother        kidney transplant    Past Surgical History:  Procedure Laterality Date  . CHOLECYSTECTOMY N/A 04/18/2015   Procedure: LAPAROSCOPIC CHOLECYSTECTOMY;  Surgeon: Ralene Ok, MD;  Location: [facility];  Service: General;  Laterality: N/A;  . HYSTEROSCOPY W/D&C  12/24/2001   Archie Endo 09/28/2010  . I&D EXTREMITY Right 07/25/2016   Procedure: IRRIGATION AND DEBRIDEMENT RIGHT LEG ULCER, APPLY VERAFLO VAC;  Surgeon: [name], MD;  Location: [facility];  Service: Orthopedics;  Laterality: Right;  . INCISE AND DRAIN ABCESS Right 07/14/2016  . INCISION AND DRAINAGE Right 09/10/2008   leg:  skin and soft tissue and muscle/notes 09/15/2010  . INCISION AND DRAINAGE Right 01/01/2008   Chronic venous stasis insufficiency ulcer,/notes 09/14/2010/  . INCISION AND DRAINAGE Right 08/979   calf w/application wound vac/notes  07/20/2010  . INCISION AND DRAINAGE Right 07/25/2016   IRRIGATION AND DEBRIDEMENT RIGHT LEG ULCER,  . LAPAROSCOPIC GASTRIC BYPASS  ~ 2007  . SKIN GRAFT SPLIT THICKNESS LEG / FOOT Right 07/25/2016   LEG  . SKIN SPLIT GRAFT Right 07/27/2016   Procedure: SKIN GRAFT RIGHT LEG WITH THERASKIN APPLICATION;  Surgeon: [name], MD;  Location: [facility];  Service: Orthopedics;  Laterality: Right;  . TONSILLECTOMY     Social History   Occupational History  . Not on file  Tobacco Use  . Smoking status: Never Smoker  . Smokeless tobacco: Never Used  Substance and Sexual Activity  . Alcohol use: No  . Drug use: No  . Sexual activity: Yes    Partners: Male  Birth control/protection: I.U.D.

## 2018-05-29 ENCOUNTER — Encounter (INDEPENDENT_AMBULATORY_CARE_PROVIDER_SITE_OTHER): Payer: Self-pay | Admitting: Orthopedic Surgery

## 2018-05-29 ENCOUNTER — Ambulatory Visit (INDEPENDENT_AMBULATORY_CARE_PROVIDER_SITE_OTHER): Payer: Medicare Other | Admitting: Physician Assistant

## 2018-05-29 VITALS — Ht 66.0 in | Wt 329.0 lb

## 2018-05-29 DIAGNOSIS — I87331 Chronic venous hypertension (idiopathic) with ulcer and inflammation of right lower extremity: Secondary | ICD-10-CM

## 2018-05-29 DIAGNOSIS — L97919 Non-pressure chronic ulcer of unspecified part of right lower leg with unspecified severity: Secondary | ICD-10-CM | POA: Diagnosis not present

## 2018-05-29 DIAGNOSIS — Z6841 Body Mass Index (BMI) 40.0 and over, adult: Secondary | ICD-10-CM

## 2018-05-29 DIAGNOSIS — E440 Moderate protein-calorie malnutrition: Secondary | ICD-10-CM

## 2018-06-01 ENCOUNTER — Encounter (INDEPENDENT_AMBULATORY_CARE_PROVIDER_SITE_OTHER): Payer: Self-pay | Admitting: Physician Assistant

## 2018-06-01 NOTE — Progress Notes (Signed)
Office Visit Note   Patient: Alice [name]           Date of Birth: [birth year]           MRN: [id number] Visit Date: 05/29/2018              Requested by: Benito Mccreedy, MD [address] [place], [place] [address] PCP: Benito Mccreedy, MD  Chief Complaint  Patient presents with  . Right Leg - Follow-up    07/27/16 RLE ST[facility]      HPI: The patient is a 48 year old female who is seen for follow-up of her right lower leg chronic venous ulceration.  We continue to utilize silver alginate to the wound bed and Dynaflex dressings for compression.  Assessment & Plan: Visit Diagnoses:  1. 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 BMI 50.0-59.9, adult (HCC)   3. mild protein calorie malnutrition     Plan: Continue silver cell to the wound bed and application of Dynaflex compression wraps.  The patient will follow-up in 1 week.  Follow-Up Instructions: No follow-ups on file.   Ortho Exam  Patient is alert, oriented, no adenopathy, well-dressed, normal affect, normal respiratory effort. Right lower leg medial ulcer has a healthy appearing 90% pink granulation bed with scattered yellow fibrinous tissue.  There is no periwound irritation or cellulitis.  She has good pedal pulses.  Wound size is approximately 8.5 x 14 x 0.1 cm.  Imaging: No results found. No images are attached to the encounter.  Labs: Lab Results  Component Value Date   REPTSTATUS 01/04/2017 FINAL 12/30/2016   REPTSTATUS 01/04/2017 FINAL 12/30/2016   GRAMSTAIN  05/17/2013    MODERATE WBC PRESENT, PREDOMINANTLY MONONUCLEAR MODERATE GRAM POSITIVE COCCI IN PAIRS IN CHAINS RARE GRAM NEGATIVE RODS Gram Stain Report Called to,Read Back By and Verified With: HOPPER,M RN @ [phone] 05/17/13 LEONARD,A   GRAMSTAIN  05/17/2013    MODERATE WBC PRESENT, PREDOMINANTLY MONONUCLEAR MODERATE SQUAMOUS EPITHELIAL CELLS PRESENT MODERATE GRAM POSITIVE  COCCI IN PAIRS IN CHAINS RARE GRAM NEGATIVE RODS Performed at [facility]   CULT NO GROWTH 5 DAYS 12/30/2016   CULT NO GROWTH 5 DAYS 12/30/2016   LABORGA ESCHERICHIA COLI (A) 12/29/2016     Lab Results  Component Value Date   ALBUMIN 3.2 (L) 01/29/2018   ALBUMIN 2.2 (L) 01/03/2017   ALBUMIN 2.1 (L) 01/02/2017    Body mass index is 53.1 kg/m.  Orders:  No orders of the defined types were placed in this encounter.  No orders of the defined types were placed in this encounter.    Procedures: No procedures performed  Clinical Data: No additional findings.  ROS:  All other systems negative, except as noted in the HPI. Review of Systems  Objective: Vital Signs: Ht 5\' 6"  (1.676 m)   Wt (!) 329 lb (149.2 kg)   BMI 53.10 kg/m   Specialty Comments:  No specialty comments available.  PMFS History: Patient Active Problem List   Diagnosis Date Noted  . Iron deficiency anemia 01/29/2018  . Pulmonary embolus ([place]) 01/09/2018  . Encounter for therapeutic drug monitoring 01/09/2018  . Severe malnutrition ([place]) 08/24/2017  . Acute lower UTI 12/29/2016  . Sepsis ([place]) 12/29/2016  . Acute kidney injury superimposed on CKD ([place]) 12/28/2016  . Atypical chest pain 12/28/2016  . Hx of skin graft 08/01/2016  . 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 ([place]) 07/25/2016  .  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 Trichomonal infection 11/24/2015  . Abdominal pain 05/02/2015  . Symptomatic cholelithiasis 04/16/2015  . Acute bilateral upper abdominal pain 04/16/2015  . Microcytic anemia 05/18/2013  . Hypotension, unspecified 05/17/2013  . CKD (chronic kidney disease), stage III ([place]) 05/17/2013  . Hypokalemia 05/17/2013  . Anal fissure 02/06/2013  . Anal skin tag 02/06/2013  . ALLERGIC RHINITIS 03/05/2010  . LOW BACK PAIN SYNDROME 12/13/2007  . WOUND INFECTION 10/10/2007  .  OBESITY, MORBID 12/02/2006  . HTN (hypertension) 12/02/2006  . History of pulmonary embolism 12/02/2006  . History of DVT (deep vein thrombosis) 12/02/2006  . 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 GASTROESOPHAGEAL REFLUX DISEASE 12/02/2006  . OSA (obstructive sleep apnea) 12/02/2006   Past Medical History:  Diagnosis Date  . Anemia   . Anxiety   . Chronic bronchitis ([place])   . Chronic upper back pain   . Depression   . DVT (deep venous thrombosis) (HCC)    BLE  . GERD (gastroesophageal reflux disease)   . Headache    "weekly" (04/16/2015)  . Hypertension   . Migraine    "monthly" (04/16/2015)  . Obesity   . Pneumonia 2014  . Pulmonary embolism ([place])   . Sinusitis nasal   . Sleep apnea    "I'm suppose to wear a mask but I don't" (04/16/2015)  . Varicose veins of right lower extremity   . Venous stasis of lower extremity    right    Family History  Problem Relation Age of Onset  . Kidney disease Mother        kidney transplant    Past Surgical History:  Procedure Laterality Date  . CHOLECYSTECTOMY N/A 04/18/2015   Procedure: LAPAROSCOPIC CHOLECYSTECTOMY;  Surgeon: Ralene Ok, MD;  Location: [facility];  Service: General;  Laterality: N/A;  . HYSTEROSCOPY W/D&C  12/24/2001   Archie Endo 09/28/2010  . I&D EXTREMITY Right 07/25/2016   Procedure: IRRIGATION AND DEBRIDEMENT RIGHT LEG ULCER, APPLY VERAFLO VAC;  Surgeon: [name], MD;  Location: [facility];  Service: Orthopedics;  Laterality: Right;  . INCISE AND DRAIN ABCESS Right 07/14/2016  . INCISION AND DRAINAGE Right 09/10/2008   leg:  skin and soft tissue and muscle/notes 09/15/2010  . INCISION AND DRAINAGE Right 01/01/2008   Chronic venous stasis insufficiency ulcer,/notes 09/14/2010/  . INCISION AND DRAINAGE Right 10/6597   calf w/application wound vac/notes 07/20/2010  . INCISION AND DRAINAGE Right 07/25/2016   IRRIGATION AND DEBRIDEMENT RIGHT LEG ULCER,  . LAPAROSCOPIC GASTRIC BYPASS  ~ 2007  . SKIN GRAFT SPLIT  THICKNESS LEG / FOOT Right 07/25/2016   LEG  . SKIN SPLIT GRAFT Right 07/27/2016   Procedure: SKIN GRAFT RIGHT LEG WITH THERASKIN APPLICATION;  Surgeon: [name], MD;  Location: [facility];  Service: Orthopedics;  Laterality: Right;  . TONSILLECTOMY     Social History   Occupational History  . Not on file  Tobacco Use  . Smoking status: Never Smoker  . Smokeless tobacco: Never Used  Substance and Sexual Activity  . Alcohol use: No  . Drug use: No  . Sexual activity: Yes    Partners: Male    Birth control/protection: I.U.D.

## 2018-06-05 ENCOUNTER — Ambulatory Visit (INDEPENDENT_AMBULATORY_CARE_PROVIDER_SITE_OTHER): Payer: Medicare Other | Admitting: Physician Assistant

## 2018-06-14 ENCOUNTER — Ambulatory Visit (INDEPENDENT_AMBULATORY_CARE_PROVIDER_SITE_OTHER): Payer: Medicare Other | Admitting: Pharmacist

## 2018-06-14 ENCOUNTER — Encounter (INDEPENDENT_AMBULATORY_CARE_PROVIDER_SITE_OTHER): Payer: Self-pay

## 2018-06-14 DIAGNOSIS — I2699 Other pulmonary embolism without acute cor pulmonale: Secondary | ICD-10-CM

## 2018-06-14 DIAGNOSIS — Z86718 Personal history of other venous thrombosis and embolism: Secondary | ICD-10-CM

## 2018-06-14 DIAGNOSIS — Z5181 Encounter for therapeutic drug level monitoring: Secondary | ICD-10-CM | POA: Diagnosis not present

## 2018-06-14 LAB — POCT INR: INR: 3.5 — AB (ref 2.0–3.0)

## 2018-06-14 NOTE — Patient Instructions (Signed)
Description   Skip your Coumadin today, then continue taking 3 tablets daily except 4 tablets on Wednesdays and Sundays.  Recheck INR in 3 weeks.  [facility] [phone] [address] [phone]

## 2018-06-15 ENCOUNTER — Encounter (INDEPENDENT_AMBULATORY_CARE_PROVIDER_SITE_OTHER): Payer: Self-pay | Admitting: Physician Assistant

## 2018-06-15 ENCOUNTER — Ambulatory Visit (INDEPENDENT_AMBULATORY_CARE_PROVIDER_SITE_OTHER): Payer: Medicare Other | Admitting: Physician Assistant

## 2018-06-15 VITALS — Ht 66.0 in | Wt 329.0 lb

## 2018-06-15 DIAGNOSIS — L97919 Non-pressure chronic ulcer of unspecified part of right lower leg with unspecified severity: Secondary | ICD-10-CM | POA: Diagnosis not present

## 2018-06-15 DIAGNOSIS — I87331 Chronic venous hypertension (idiopathic) with ulcer and inflammation of right lower extremity: Secondary | ICD-10-CM

## 2018-06-15 MED ORDER — TRAMADOL HCL 50 MG PO TABS: 50.0000 mg | ORAL_TABLET | Freq: Four times a day (QID) | ORAL | 0 refills | Status: DC | PRN

## 2018-06-15 NOTE — Progress Notes (Signed)
Office Visit Note   Patient: Alice [name]           Date of Birth: September 08, 1970           MRN: [id number] Visit Date: 06/15/2018              Requested by: Benito Mccreedy, MD [address] [place], [place] [address] PCP: Benito Mccreedy, MD  Chief Complaint  Patient presents with  . Right Leg - Follow-up      HPI: The patient is a 48 yo woman who is seen for follow up of her right lower leg chronic venous ulceration. We are using some portions of silver compression socks to the area several times weekly with a dynaflex compression wrap. The patient reports she had to drive back and forth to [facility] to help with her dad over the past couple of weeks and has had more swelling in her right leg.   Assessment & Plan: Visit Diagnoses:  1. 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 She will follow up next week.   Follow-Up Instructions: Return in about 1 week (around 06/22/2018).   Ortho Exam  Patient is alert, oriented, no adenopathy, well-dressed, normal affect, normal respiratory effort. The wound bed has more fibrinous tissue over the wound bed today. There are no signs of infection or cellulitis. Wound size is 8.5 x 14 x 0.1 cm    Imaging: No results found. No images are attached to the encounter.  Labs: Lab Results  Component Value Date   REPTSTATUS 01/04/2017 FINAL 12/30/2016   REPTSTATUS 01/04/2017 FINAL 12/30/2016   GRAMSTAIN  05/17/2013    MODERATE WBC PRESENT, PREDOMINANTLY MONONUCLEAR MODERATE GRAM POSITIVE COCCI IN PAIRS IN CHAINS RARE GRAM NEGATIVE RODS Gram Stain Report Called to,Read Back By and Verified With: HOPPER,M RN @ [phone] 05/17/13 LEONARD,A   GRAMSTAIN  05/17/2013    MODERATE WBC PRESENT, PREDOMINANTLY MONONUCLEAR MODERATE SQUAMOUS EPITHELIAL CELLS  PRESENT MODERATE GRAM POSITIVE COCCI IN PAIRS IN CHAINS RARE GRAM NEGATIVE RODS Performed at [facility]   CULT NO GROWTH 5 DAYS 12/30/2016   CULT NO GROWTH 5 DAYS 12/30/2016   LABORGA ESCHERICHIA COLI (A) 12/29/2016     Lab Results  Component Value Date   ALBUMIN 3.2 (L) 01/29/2018   ALBUMIN 2.2 (L) 01/03/2017   ALBUMIN 2.1 (L) 01/02/2017    Body mass index is 53.1 kg/m.  Orders:  No orders of the defined types were placed in this encounter.  No orders of the defined types were placed in this encounter.    Procedures: No procedures performed  Clinical Data: No additional findings.  ROS:  All other systems negative, except as noted in the HPI. Review of Systems  Objective: Vital Signs: Ht 5\' 6"  (1.676 m)   Wt (!) 329 lb (149.2 kg)   BMI 53.10 kg/m   Specialty Comments:  No specialty comments available.  PMFS History: Patient Active Problem List   Diagnosis Date Noted  . Iron deficiency anemia 01/29/2018  . Pulmonary embolus ([place]) 01/09/2018  . Encounter for therapeutic drug monitoring 01/09/2018  . Severe malnutrition ([place]) 08/24/2017  . Acute lower UTI 12/29/2016  . Sepsis ([place]) 12/29/2016  . Acute kidney injury superimposed on CKD ([place]) 12/28/2016  . Atypical chest pain 12/28/2016  . Hx of skin graft 08/01/2016  .  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 ([place]) 07/25/2016  . 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 Trichomonal infection 11/24/2015  . Abdominal pain 05/02/2015  . Symptomatic cholelithiasis 04/16/2015  . Acute bilateral upper abdominal pain 04/16/2015  . Microcytic anemia 05/18/2013  . Hypotension, unspecified 05/17/2013  . CKD (chronic kidney disease), stage III ([place]) 05/17/2013  . Hypokalemia 05/17/2013  . Anal fissure 02/06/2013  . Anal skin tag 02/06/2013  . ALLERGIC RHINITIS 03/05/2010  . LOW BACK PAIN SYNDROME 12/13/2007  . WOUND  INFECTION 10/10/2007  . OBESITY, MORBID 12/02/2006  . HTN (hypertension) 12/02/2006  . History of pulmonary embolism 12/02/2006  . History of DVT (deep vein thrombosis) 12/02/2006  . 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 GASTROESOPHAGEAL REFLUX DISEASE 12/02/2006  . OSA (obstructive sleep apnea) 12/02/2006   Past Medical History:  Diagnosis Date  . Anemia   . Anxiety   . Chronic bronchitis ([place])   . Chronic upper back pain   . Depression   . DVT (deep venous thrombosis) (HCC)    BLE  . GERD (gastroesophageal reflux disease)   . Headache    "weekly" (04/16/2015)  . Hypertension   . Migraine    "monthly" (04/16/2015)  . Obesity   . Pneumonia 2014  . Pulmonary embolism ([place])   . Sinusitis nasal   . Sleep apnea    "I'm suppose to wear a mask but I don't" (04/16/2015)  . Varicose veins of right lower extremity   . Venous stasis of lower extremity    right    Family History  Problem Relation Age of Onset  . Kidney disease Mother        kidney transplant    Past Surgical History:  Procedure Laterality Date  . CHOLECYSTECTOMY N/A 04/18/2015   Procedure: LAPAROSCOPIC CHOLECYSTECTOMY;  Surgeon: Ralene Ok, MD;  Location: [facility];  Service: General;  Laterality: N/A;  . HYSTEROSCOPY W/D&C  12/24/2001   Archie Endo 09/28/2010  . I&D EXTREMITY Right 07/25/2016   Procedure: IRRIGATION AND DEBRIDEMENT RIGHT LEG ULCER, APPLY VERAFLO VAC;  Surgeon: [name], MD;  Location: [facility];  Service: Orthopedics;  Laterality: Right;  . INCISE AND DRAIN ABCESS Right 07/14/2016  . INCISION AND DRAINAGE Right 09/10/2008   leg:  skin and soft tissue and muscle/notes 09/15/2010  . INCISION AND DRAINAGE Right 01/01/2008   Chronic venous stasis insufficiency ulcer,/notes 09/14/2010/  . INCISION AND DRAINAGE Right 07/5463   calf w/application wound vac/notes 07/20/2010  . INCISION AND DRAINAGE Right 07/25/2016   IRRIGATION AND DEBRIDEMENT RIGHT LEG ULCER,  . LAPAROSCOPIC GASTRIC BYPASS  ~ 2007   . SKIN GRAFT SPLIT THICKNESS LEG / FOOT Right 07/25/2016   LEG  . SKIN SPLIT GRAFT Right 07/27/2016   Procedure: SKIN GRAFT RIGHT LEG WITH THERASKIN APPLICATION;  Surgeon: [name], MD;  Location: [facility];  Service: Orthopedics;  Laterality: Right;  . TONSILLECTOMY     Social History   Occupational History  . Not on file  Tobacco Use  . Smoking status: Never Smoker  . Smokeless tobacco: Never Used  Substance and Sexual Activity  . Alcohol use: No  . Drug use: No  . Sexual activity: Yes    Partners: Male    Birth control/protection: I.U.D.

## 2018-06-15 NOTE — Addendum Note (Signed)
Addended by: Milas Gain on: 06/15/2018 03:05 PM   Modules accepted: Orders

## 2018-06-19 ENCOUNTER — Ambulatory Visit (INDEPENDENT_AMBULATORY_CARE_PROVIDER_SITE_OTHER): Payer: Medicare Other | Admitting: Physician Assistant

## 2018-06-26 ENCOUNTER — Ambulatory Visit (INDEPENDENT_AMBULATORY_CARE_PROVIDER_SITE_OTHER): Payer: Medicare Other | Admitting: Orthopedic Surgery

## 2018-06-26 DIAGNOSIS — I87331 Chronic venous hypertension (idiopathic) with ulcer and inflammation of right lower extremity: Secondary | ICD-10-CM

## 2018-06-26 DIAGNOSIS — L97919 Non-pressure chronic ulcer of unspecified part of right lower leg with unspecified severity: Secondary | ICD-10-CM | POA: Diagnosis not present

## 2018-06-28 ENCOUNTER — Encounter (INDEPENDENT_AMBULATORY_CARE_PROVIDER_SITE_OTHER): Payer: Medicare Other

## 2018-06-29 ENCOUNTER — Encounter (INDEPENDENT_AMBULATORY_CARE_PROVIDER_SITE_OTHER): Payer: Self-pay | Admitting: Orthopedic Surgery

## 2018-06-29 NOTE — Progress Notes (Signed)
Office Visit Note   Patient: Alice [name]           Date of Birth: Jul 21, 1970           MRN: [id number] Visit Date: 06/26/2018              Requested by: Benito Mccreedy, MD [address] [place], [place] [address] PCP: Benito Mccreedy, MD  Chief Complaint  Patient presents with  . Right Leg - Follow-up      HPI: Patient is a 48 year old woman with chronic venous insufficiency ulcer right lower extremity.  Patient states she has an appointment this week for evaluation of her gastric bypass.  She states she has been losing weight.  Assessment & Plan: Visit Diagnoses:  1. 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  We will continue with the sock beneath the compression wrap.  Plan: Compression reapplied with Dynaflex.  Collagen dressing applied.  Follow-Up Instructions: Return in about 1 week (around 07/03/2018).   Ortho Exam  Patient is alert, oriented, no adenopathy, well-dressed, normal affect, normal respiratory effort. Examination patient does have decreased swelling there is healthy granulation tissue at the wound bed.  The wound is flat no ischemic changes no drainage no signs of infection.  Imaging: No results found.    Labs: Lab Results  Component Value Date   REPTSTATUS 01/04/2017 FINAL 12/30/2016   REPTSTATUS 01/04/2017 FINAL 12/30/2016   GRAMSTAIN  05/17/2013    MODERATE WBC PRESENT, PREDOMINANTLY MONONUCLEAR MODERATE GRAM POSITIVE COCCI IN PAIRS IN CHAINS RARE GRAM NEGATIVE RODS Gram Stain Report Called to,Read Back By and Verified With: HOPPER,M RN @ [phone] 05/17/13 LEONARD,A   GRAMSTAIN  05/17/2013    MODERATE WBC PRESENT, PREDOMINANTLY MONONUCLEAR MODERATE SQUAMOUS EPITHELIAL CELLS PRESENT MODERATE GRAM POSITIVE COCCI IN PAIRS IN CHAINS RARE GRAM NEGATIVE RODS Performed at [facility]   CULT NO GROWTH 5 DAYS 12/30/2016   CULT  NO GROWTH 5 DAYS 12/30/2016   LABORGA ESCHERICHIA COLI (A) 12/29/2016     Lab Results  Component Value Date   ALBUMIN 3.2 (L) 01/29/2018   ALBUMIN 2.2 (L) 01/03/2017   ALBUMIN 2.1 (L) 01/02/2017    There is no height or weight on file to calculate BMI.  Orders:  No orders of the defined types were placed in this encounter.  No orders of the defined types were placed in this encounter.    Procedures: No procedures performed  Clinical Data: No additional findings.  ROS:  All other systems negative, except as noted in the HPI. Review of Systems  Objective: Vital Signs: There were no vitals taken for this visit.  Specialty Comments:  No specialty comments available.  PMFS History: Patient Active Problem List   Diagnosis Date Noted  . Iron deficiency anemia 01/29/2018  . Pulmonary embolus ([place]) 01/09/2018  . Encounter for therapeutic drug monitoring 01/09/2018  . Severe malnutrition ([place]) 08/24/2017  . Acute lower UTI 12/29/2016  . Sepsis ([place]) 12/29/2016  . Acute kidney injury superimposed on CKD ([place]) 12/28/2016  . Atypical chest pain 12/28/2016  . Hx of skin graft 08/01/2016  . 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 ([place]) 07/25/2016  . 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 Trichomonal infection 11/24/2015  . Abdominal pain 05/02/2015  . Symptomatic cholelithiasis 04/16/2015  . Acute bilateral upper abdominal pain 04/16/2015  .  Microcytic anemia 05/18/2013  . Hypotension, unspecified 05/17/2013  . CKD (chronic kidney disease), stage III ([place]) 05/17/2013  . Hypokalemia 05/17/2013  . Anal fissure 02/06/2013  . Anal skin tag 02/06/2013  . ALLERGIC RHINITIS 03/05/2010  . LOW BACK PAIN SYNDROME 12/13/2007  . WOUND INFECTION 10/10/2007  . OBESITY, MORBID 12/02/2006  . HTN (hypertension) 12/02/2006  . History of pulmonary embolism 12/02/2006  . History of DVT (deep  vein thrombosis) 12/02/2006  . 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 GASTROESOPHAGEAL REFLUX DISEASE 12/02/2006  . OSA (obstructive sleep apnea) 12/02/2006   Past Medical History:  Diagnosis Date  . Anemia   . Anxiety   . Chronic bronchitis ([place])   . Chronic upper back pain   . Depression   . DVT (deep venous thrombosis) (HCC)    BLE  . GERD (gastroesophageal reflux disease)   . Headache    "weekly" (04/16/2015)  . Hypertension   . Migraine    "monthly" (04/16/2015)  . Obesity   . Pneumonia 2014  . Pulmonary embolism ([place])   . Sinusitis nasal   . Sleep apnea    "I'm suppose to wear a mask but I don't" (04/16/2015)  . Varicose veins of right lower extremity   . Venous stasis of lower extremity    right    Family History  Problem Relation Age of Onset  . Kidney disease Mother        kidney transplant    Past Surgical History:  Procedure Laterality Date  . CHOLECYSTECTOMY N/A 04/18/2015   Procedure: LAPAROSCOPIC CHOLECYSTECTOMY;  Surgeon: Ralene Ok, MD;  Location: [facility];  Service: General;  Laterality: N/A;  . HYSTEROSCOPY W/D&C  12/24/2001   Archie Endo 09/28/2010  . I&D EXTREMITY Right 07/25/2016   Procedure: IRRIGATION AND DEBRIDEMENT RIGHT LEG ULCER, APPLY VERAFLO VAC;  Surgeon: [name], MD;  Location: [facility];  Service: Orthopedics;  Laterality: Right;  . INCISE AND DRAIN ABCESS Right 07/14/2016  . INCISION AND DRAINAGE Right 09/10/2008   leg:  skin and soft tissue and muscle/notes 09/15/2010  . INCISION AND DRAINAGE Right 01/01/2008   Chronic venous stasis insufficiency ulcer,/notes 09/14/2010/  . INCISION AND DRAINAGE Right 05/6107   calf w/application wound vac/notes 07/20/2010  . INCISION AND DRAINAGE Right 07/25/2016   IRRIGATION AND DEBRIDEMENT RIGHT LEG ULCER,  . LAPAROSCOPIC GASTRIC BYPASS  ~ 2007  . SKIN GRAFT SPLIT THICKNESS LEG / FOOT Right 07/25/2016   LEG  . SKIN SPLIT GRAFT Right 07/27/2016   Procedure: SKIN GRAFT RIGHT LEG WITH THERASKIN  APPLICATION;  Surgeon: [name], MD;  Location: [facility];  Service: Orthopedics;  Laterality: Right;  . TONSILLECTOMY     Social History   Occupational History  . Not on file  Tobacco Use  . Smoking status: Never Smoker  . Smokeless tobacco: Never Used  Substance and Sexual Activity  . Alcohol use: No  . Drug use: No  . Sexual activity: Yes    Partners: Male    Birth control/protection: I.U.D.

## 2018-07-03 ENCOUNTER — Ambulatory Visit (INDEPENDENT_AMBULATORY_CARE_PROVIDER_SITE_OTHER): Payer: Medicare Other | Admitting: Orthopedic Surgery

## 2018-07-03 ENCOUNTER — Encounter (INDEPENDENT_AMBULATORY_CARE_PROVIDER_SITE_OTHER): Payer: Self-pay | Admitting: Orthopedic Surgery

## 2018-07-03 VITALS — Ht 66.0 in | Wt 329.0 lb

## 2018-07-03 DIAGNOSIS — I87331 Chronic venous hypertension (idiopathic) with ulcer and inflammation of right lower extremity: Secondary | ICD-10-CM | POA: Diagnosis not present

## 2018-07-03 DIAGNOSIS — L97919 Non-pressure chronic ulcer of unspecified part of right lower leg with unspecified severity: Secondary | ICD-10-CM

## 2018-07-03 MED ORDER — TRAMADOL HCL 50 MG PO TABS: 50.0000 mg | ORAL_TABLET | Freq: Four times a day (QID) | ORAL | 0 refills | Status: DC | PRN

## 2018-07-05 ENCOUNTER — Ambulatory Visit (INDEPENDENT_AMBULATORY_CARE_PROVIDER_SITE_OTHER): Payer: Medicare Other | Admitting: *Deleted

## 2018-07-05 DIAGNOSIS — Z86718 Personal history of other venous thrombosis and embolism: Secondary | ICD-10-CM

## 2018-07-05 DIAGNOSIS — I2699 Other pulmonary embolism without acute cor pulmonale: Secondary | ICD-10-CM

## 2018-07-05 DIAGNOSIS — Z5181 Encounter for therapeutic drug level monitoring: Secondary | ICD-10-CM | POA: Diagnosis not present

## 2018-07-05 LAB — POCT INR: INR: 2.3 (ref 2.0–3.0)

## 2018-07-05 NOTE — Patient Instructions (Signed)
Description   Continue taking 3 tablets daily except 4 tablets on Wednesdays and Sundays.  Recheck INR in 4 weeks.  [facility] [phone] Main [phone]

## 2018-07-07 ENCOUNTER — Encounter (INDEPENDENT_AMBULATORY_CARE_PROVIDER_SITE_OTHER): Payer: Self-pay | Admitting: Orthopedic Surgery

## 2018-07-07 NOTE — Progress Notes (Signed)
Office Visit Note   Patient: Alice [name]           Date of Birth: 1971/02/27           MRN: [id number] Visit Date: 07/03/2018              Requested by: Benito Mccreedy, MD [address] [place], [place] [address] PCP: Benito Mccreedy, MD  Chief Complaint  Patient presents with  . Right Leg - Follow-up      HPI: Patient is a 48 year old woman with chronic venous insufficiency.  She previously is using a collagen dressing plus compression wrap.  Assessment & Plan: Visit Diagnoses:  1. 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  Patient will follow-up next week for reevaluation.  Follow-Up Instructions: Return in about 1 week (around 07/10/2018).   Ortho Exam  Patient is alert, oriented, no adenopathy, well-dressed, normal affect, normal respiratory effort. Examination patient does have decreased swelling there is improved granulation tissue.  No odor no redness no drainage no signs of infection.  Imaging: No results found. No images are attached to the encounter.  Labs: Lab Results  Component Value Date   REPTSTATUS 01/04/2017 FINAL 12/30/2016   REPTSTATUS 01/04/2017 FINAL 12/30/2016   GRAMSTAIN  05/17/2013    MODERATE WBC PRESENT, PREDOMINANTLY MONONUCLEAR MODERATE GRAM POSITIVE COCCI IN PAIRS IN CHAINS RARE GRAM NEGATIVE RODS Gram Stain Report Called to,Read Back By and Verified With: HOPPER,M RN @ [phone] 05/17/13 LEONARD,A   GRAMSTAIN  05/17/2013    MODERATE WBC PRESENT, PREDOMINANTLY MONONUCLEAR MODERATE SQUAMOUS EPITHELIAL CELLS PRESENT MODERATE GRAM POSITIVE COCCI IN PAIRS IN CHAINS RARE GRAM NEGATIVE RODS Performed at [facility]   CULT NO GROWTH 5 DAYS 12/30/2016   CULT NO GROWTH 5 DAYS 12/30/2016   LABORGA ESCHERICHIA COLI (A) 12/29/2016     Lab Results  Component Value Date   ALBUMIN 3.2 (L) 01/29/2018   ALBUMIN  2.2 (L) 01/03/2017   ALBUMIN 2.1 (L) 01/02/2017    Body mass index is 53.1 kg/m.  Orders:  No orders of the defined types were placed in this encounter.  Meds ordered this encounter  Medications  . traMADol (ULTRAM) 50 MG tablet    Sig: Take 1 tablet (50 mg total) by mouth every 6 (six) hours as needed for moderate pain.    Dispense:  30 tablet    Refill:  0     Procedures: No procedures performed  Clinical Data: No additional findings.  ROS:  All other systems negative, except as noted in the HPI. Review of Systems  Objective: Vital Signs: Ht 5\' 6"  (1.676 m)   Wt (!) 329 lb (149.2 kg)   BMI 53.10 kg/m   Specialty Comments:  No specialty comments available.  PMFS History: Patient Active Problem List   Diagnosis Date Noted  . Iron deficiency anemia 01/29/2018  . Pulmonary embolus ([place]) 01/09/2018  . Encounter for therapeutic drug monitoring 01/09/2018  . Severe malnutrition ([place]) 08/24/2017  . Acute lower UTI 12/29/2016  . Sepsis ([place]) 12/29/2016  . Acute kidney injury superimposed on CKD ([place]) 12/28/2016  . Atypical chest pain 12/28/2016  . Hx of skin graft 08/01/2016  . 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 ([place]) 07/25/2016  . 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 Trichomonal infection 11/24/2015  . Abdominal pain  05/02/2015  . Symptomatic cholelithiasis 04/16/2015  . Acute bilateral upper abdominal pain 04/16/2015  . Microcytic anemia 05/18/2013  . Hypotension, unspecified 05/17/2013  . CKD (chronic kidney disease), stage III ([place]) 05/17/2013  . Hypokalemia 05/17/2013  . Anal fissure 02/06/2013  . Anal skin tag 02/06/2013  . ALLERGIC RHINITIS 03/05/2010  . LOW BACK PAIN SYNDROME 12/13/2007  . WOUND INFECTION 10/10/2007  . OBESITY, MORBID 12/02/2006  . HTN (hypertension) 12/02/2006  . History of pulmonary embolism 12/02/2006  . History of DVT (deep vein  thrombosis) 12/02/2006  . 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 GASTROESOPHAGEAL REFLUX DISEASE 12/02/2006  . OSA (obstructive sleep apnea) 12/02/2006   Past Medical History:  Diagnosis Date  . Anemia   . Anxiety   . Chronic bronchitis ([place])   . Chronic upper back pain   . Depression   . DVT (deep venous thrombosis) (HCC)    BLE  . GERD (gastroesophageal reflux disease)   . Headache    "weekly" (04/16/2015)  . Hypertension   . Migraine    "monthly" (04/16/2015)  . Obesity   . Pneumonia 2014  . Pulmonary embolism ([place])   . Sinusitis nasal   . Sleep apnea    "I'm suppose to wear a mask but I don't" (04/16/2015)  . Varicose veins of right lower extremity   . Venous stasis of lower extremity    right    Family History  Problem Relation Age of Onset  . Kidney disease Mother        kidney transplant    Past Surgical History:  Procedure Laterality Date  . CHOLECYSTECTOMY N/A 04/18/2015   Procedure: LAPAROSCOPIC CHOLECYSTECTOMY;  Surgeon: Ralene Ok, MD;  Location: [facility];  Service: General;  Laterality: N/A;  . HYSTEROSCOPY W/D&C  12/24/2001   Archie Endo 09/28/2010  . I&D EXTREMITY Right 07/25/2016   Procedure: IRRIGATION AND DEBRIDEMENT RIGHT LEG ULCER, APPLY VERAFLO VAC;  Surgeon: [name], MD;  Location: [facility];  Service: Orthopedics;  Laterality: Right;  . INCISE AND DRAIN ABCESS Right 07/14/2016  . INCISION AND DRAINAGE Right 09/10/2008   leg:  skin and soft tissue and muscle/notes 09/15/2010  . INCISION AND DRAINAGE Right 01/01/2008   Chronic venous stasis insufficiency ulcer,/notes 09/14/2010/  . INCISION AND DRAINAGE Right 06/2480   calf w/application wound vac/notes 07/20/2010  . INCISION AND DRAINAGE Right 07/25/2016   IRRIGATION AND DEBRIDEMENT RIGHT LEG ULCER,  . LAPAROSCOPIC GASTRIC BYPASS  ~ 2007  . SKIN GRAFT SPLIT THICKNESS LEG / FOOT Right 07/25/2016   LEG  . SKIN SPLIT GRAFT Right 07/27/2016   Procedure: SKIN GRAFT RIGHT LEG WITH THERASKIN  APPLICATION;  Surgeon: [name], MD;  Location: [facility];  Service: Orthopedics;  Laterality: Right;  . TONSILLECTOMY     Social History   Occupational History  . Not on file  Tobacco Use  . Smoking status: Never Smoker  . Smokeless tobacco: Never Used  Substance and Sexual Activity  . Alcohol use: No  . Drug use: No  . Sexual activity: Yes    Partners: Male    Birth control/protection: I.U.D.

## 2018-07-10 ENCOUNTER — Ambulatory Visit (INDEPENDENT_AMBULATORY_CARE_PROVIDER_SITE_OTHER): Payer: Medicare Other | Admitting: Orthopedic Surgery

## 2018-07-10 ENCOUNTER — Encounter (INDEPENDENT_AMBULATORY_CARE_PROVIDER_SITE_OTHER): Payer: Medicare Other

## 2018-07-10 ENCOUNTER — Encounter (INDEPENDENT_AMBULATORY_CARE_PROVIDER_SITE_OTHER): Payer: Self-pay | Admitting: Orthopedic Surgery

## 2018-07-10 VITALS — Ht 66.0 in | Wt 329.0 lb

## 2018-07-10 DIAGNOSIS — Z945 Skin transplant status: Secondary | ICD-10-CM

## 2018-07-10 DIAGNOSIS — L97919 Non-pressure chronic ulcer of unspecified part of right lower leg with unspecified severity: Secondary | ICD-10-CM

## 2018-07-10 DIAGNOSIS — I87331 Chronic venous hypertension (idiopathic) with ulcer and inflammation of right lower extremity: Secondary | ICD-10-CM | POA: Diagnosis not present

## 2018-07-10 DIAGNOSIS — E440 Moderate protein-calorie malnutrition: Secondary | ICD-10-CM

## 2018-07-10 DIAGNOSIS — Z6841 Body Mass Index (BMI) 40.0 and over, adult: Secondary | ICD-10-CM

## 2018-07-11 ENCOUNTER — Ambulatory Visit (INDEPENDENT_AMBULATORY_CARE_PROVIDER_SITE_OTHER): Payer: Medicare Other | Admitting: Family Medicine

## 2018-07-11 ENCOUNTER — Encounter (INDEPENDENT_AMBULATORY_CARE_PROVIDER_SITE_OTHER): Payer: Self-pay | Admitting: Orthopedic Surgery

## 2018-07-11 NOTE — Progress Notes (Signed)
Office Visit Note   Patient: TAIYA NUTTING           Date of Birth: 26-Nov-1970           MRN: [id number] Visit Date: 07/10/2018              Requested by: Benito Mccreedy, MD [place], [place] [address] PCP: Benito Mccreedy, MD  Chief Complaint  Patient presents with  . Right Leg - Follow-up      HPI: Patient is seen in follow-up for chronic venous ulcer right leg she is using compression and last visit we used a collagen graft.  Assessment & Plan: Visit Diagnoses:  1. 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 BMI 50.0-59.9, adult (HCC)   3. mild protein calorie malnutrition   4. Hx of skin graft     Plan: Reapply collagen graft plus the Dynaflex wrap.  Follow-Up Instructions: Return in about 1 week (around 07/17/2018).   Ortho Exam  Patient is alert, oriented, no adenopathy, well-dressed, normal affect, normal respiratory effort. Examination patient has improved granulation tissue specifically around the edges of the wound there is no odor no drainage no cellulitis no signs of infection.  Imaging: No results found. No images are attached to the encounter.  Labs: Lab Results  Component Value Date   REPTSTATUS 01/04/2017 FINAL 12/30/2016   REPTSTATUS 01/04/2017 FINAL 12/30/2016   GRAMSTAIN  05/17/2013    MODERATE WBC PRESENT, PREDOMINANTLY MONONUCLEAR MODERATE GRAM POSITIVE COCCI IN PAIRS IN CHAINS RARE GRAM NEGATIVE RODS Gram Stain Report Called to,Read Back By and Verified With: HOPPER,M RN @ [phone] 05/17/13 LEONARD,A   GRAMSTAIN  05/17/2013    MODERATE WBC PRESENT, PREDOMINANTLY MONONUCLEAR MODERATE SQUAMOUS EPITHELIAL CELLS PRESENT MODERATE GRAM POSITIVE COCCI IN PAIRS IN CHAINS RARE GRAM NEGATIVE RODS Performed at [facility]   CULT NO GROWTH 5 DAYS 12/30/2016   CULT NO GROWTH 5 DAYS 12/30/2016   LABORGA ESCHERICHIA COLI (A) 12/29/2016     Lab Results  Component  Value Date   ALBUMIN 3.2 (L) 01/29/2018   ALBUMIN 2.2 (L) 01/03/2017   ALBUMIN 2.1 (L) 01/02/2017    Body mass index is 53.1 kg/m.  Orders:  No orders of the defined types were placed in this encounter.  No orders of the defined types were placed in this encounter.    Procedures: No procedures performed  Clinical Data: No additional findings.  ROS:  All other systems negative, except as noted in the HPI. Review of Systems  Objective: Vital Signs: Ht 5\' 6"  (1.676 m)   Wt (!) 329 lb (149.2 kg)   BMI 53.10 kg/m   Specialty Comments:  No specialty comments available.  PMFS History: Patient Active Problem List   Diagnosis Date Noted  . Iron deficiency anemia 01/29/2018  . Pulmonary embolus ([place]) 01/09/2018  . Encounter for therapeutic drug monitoring 01/09/2018  . Severe malnutrition ([place]) 08/24/2017  . Acute lower UTI 12/29/2016  . Sepsis ([place]) 12/29/2016  . Acute kidney injury superimposed on CKD ([place]) 12/28/2016  . Atypical chest pain 12/28/2016  . Hx of skin graft 08/01/2016  . 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 ([place]) 07/25/2016  . 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 Trichomonal infection 11/24/2015  . Abdominal pain 05/02/2015  . Symptomatic cholelithiasis 04/16/2015  . Acute bilateral upper abdominal pain 04/16/2015  . Microcytic  anemia 05/18/2013  . Hypotension, unspecified 05/17/2013  . CKD (chronic kidney disease), stage III ([place]) 05/17/2013  . Hypokalemia 05/17/2013  . Anal fissure 02/06/2013  . Anal skin tag 02/06/2013  . ALLERGIC RHINITIS 03/05/2010  . LOW BACK PAIN SYNDROME 12/13/2007  . WOUND INFECTION 10/10/2007  . OBESITY, MORBID 12/02/2006  . HTN (hypertension) 12/02/2006  . History of pulmonary embolism 12/02/2006  . History of DVT (deep vein thrombosis) 12/02/2006  . 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 GASTROESOPHAGEAL  REFLUX DISEASE 12/02/2006  . OSA (obstructive sleep apnea) 12/02/2006   Past Medical History:  Diagnosis Date  . Anemia   . Anxiety   . Chronic bronchitis ([place])   . Chronic upper back pain   . Depression   . DVT (deep venous thrombosis) (HCC)    BLE  . GERD (gastroesophageal reflux disease)   . Headache    "weekly" (04/16/2015)  . Hypertension   . Migraine    "monthly" (04/16/2015)  . Obesity   . Pneumonia 2014  . Pulmonary embolism ([place])   . Sinusitis nasal   . Sleep apnea    "I'm suppose to wear a mask but I don't" (04/16/2015)  . Varicose veins of right lower extremity   . Venous stasis of lower extremity    right    Family History  Problem Relation Age of Onset  . Kidney disease Mother        kidney transplant    Past Surgical History:  Procedure Laterality Date  . CHOLECYSTECTOMY N/A 04/18/2015   Procedure: LAPAROSCOPIC CHOLECYSTECTOMY;  Surgeon: Ralene Ok, MD;  Location: [facility];  Service: General;  Laterality: N/A;  . HYSTEROSCOPY W/D&C  12/24/2001   Archie Endo 09/28/2010  . I&D EXTREMITY Right 07/25/2016   Procedure: IRRIGATION AND DEBRIDEMENT RIGHT LEG ULCER, APPLY VERAFLO VAC;  Surgeon: [name], MD;  Location: [facility];  Service: Orthopedics;  Laterality: Right;  . INCISE AND DRAIN ABCESS Right 07/14/2016  . INCISION AND DRAINAGE Right 09/10/2008   leg:  skin and soft tissue and muscle/notes 09/15/2010  . INCISION AND DRAINAGE Right 01/01/2008   Chronic venous stasis insufficiency ulcer,/notes 09/14/2010/  . INCISION AND DRAINAGE Right 12/9371   calf w/application wound vac/notes 07/20/2010  . INCISION AND DRAINAGE Right 07/25/2016   IRRIGATION AND DEBRIDEMENT RIGHT LEG ULCER,  . LAPAROSCOPIC GASTRIC BYPASS  ~ 2007  . SKIN GRAFT SPLIT THICKNESS LEG / FOOT Right 07/25/2016   LEG  . SKIN SPLIT GRAFT Right 07/27/2016   Procedure: SKIN GRAFT RIGHT LEG WITH THERASKIN APPLICATION;  Surgeon: [name], MD;  Location: [facility];  Service: Orthopedics;  Laterality: Right;  .  TONSILLECTOMY     Social History   Occupational History  . Not on file  Tobacco Use  . Smoking status: Never Smoker  . Smokeless tobacco: Never Used  Substance and Sexual Activity  . Alcohol use: No  . Drug use: No  . Sexual activity: Yes    Partners: Male    Birth control/protection: I.U.D.

## 2018-07-17 ENCOUNTER — Ambulatory Visit (INDEPENDENT_AMBULATORY_CARE_PROVIDER_SITE_OTHER): Payer: Medicare Other | Admitting: Physician Assistant

## 2018-07-17 ENCOUNTER — Encounter (INDEPENDENT_AMBULATORY_CARE_PROVIDER_SITE_OTHER): Payer: Self-pay | Admitting: Orthopedic Surgery

## 2018-07-17 VITALS — Ht 66.0 in | Wt 329.0 lb

## 2018-07-17 DIAGNOSIS — I87331 Chronic venous hypertension (idiopathic) with ulcer and inflammation of right lower extremity: Secondary | ICD-10-CM

## 2018-07-17 DIAGNOSIS — L97919 Non-pressure chronic ulcer of unspecified part of right lower leg with unspecified severity: Secondary | ICD-10-CM

## 2018-07-17 MED ORDER — TRAMADOL HCL 50 MG PO TABS: 50.0000 mg | ORAL_TABLET | Freq: Four times a day (QID) | ORAL | 0 refills | Status: DC | PRN

## 2018-07-17 NOTE — Progress Notes (Signed)
Office Visit Note   Patient: Alice [name]           Date of Birth: [birth year]           MRN: [id number] Visit Date: 07/17/2018              Requested by: Benito Mccreedy, MD [place], [place] [address] PCP: Benito Mccreedy, MD  Chief Complaint  Patient presents with  . Right Leg - Follow-up    STSG right leg      HPI: The patient is a 48 year old woman who is seen for follow-up of her chronic venous ulceration of the right lower leg.  We have been utilizing endoform to portions of her wound bed and continued with Dynaflex multilayer compression wraps and this does appear to be helping.  She presents for dressing change today.  Assessment & Plan: Visit Diagnoses:  1. 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  The patient will follow-up in 1 week for recheck.  Follow-Up Instructions: Return in about 1 week (around 07/24/2018).   Ortho Exam  Patient is alert, oriented, no adenopathy, well-dressed, normal affect, normal respiratory effort. The patient continues to have improved granulation around the borders of the wound.  There is no periwound irritation or signs of cellulitis or infection.  Her edema continues to be well controlled with multilayer compression wrap.  Pedal pulses are palpable.  Imaging: No results found.   Labs: Lab Results  Component Value Date   REPTSTATUS 01/04/2017 FINAL 12/30/2016   REPTSTATUS 01/04/2017 FINAL 12/30/2016   GRAMSTAIN  05/17/2013    MODERATE WBC PRESENT, PREDOMINANTLY MONONUCLEAR MODERATE GRAM POSITIVE COCCI IN PAIRS IN CHAINS RARE GRAM NEGATIVE RODS Gram Stain Report Called to,Read Back By and Verified With: HOPPER,M RN @ [phone] 05/17/13 LEONARD,A   GRAMSTAIN  05/17/2013    MODERATE WBC PRESENT, PREDOMINANTLY  MONONUCLEAR MODERATE SQUAMOUS EPITHELIAL CELLS PRESENT MODERATE GRAM POSITIVE COCCI IN PAIRS IN CHAINS RARE GRAM NEGATIVE RODS Performed at [facility]   CULT NO GROWTH 5 DAYS 12/30/2016   CULT NO GROWTH 5 DAYS 12/30/2016   LABORGA ESCHERICHIA COLI (A) 12/29/2016     Lab Results  Component Value Date   ALBUMIN 3.2 (L) 01/29/2018   ALBUMIN 2.2 (L) 01/03/2017   ALBUMIN 2.1 (L) 01/02/2017    Body mass index is 53.1 kg/m.  Orders:  No orders of the defined types were placed in this encounter.  Meds ordered this encounter  Medications  . traMADol (ULTRAM) 50 MG tablet    Sig: Take 1 tablet (50 mg total) by mouth every 6 (six) hours as needed for moderate pain.    Dispense:  30 tablet    Refill:  0     Procedures: No procedures performed  Clinical Data: No additional findings.  ROS:  All other systems negative, except as noted in the HPI. Review of Systems  Objective: Vital Signs: Ht 5\' 6"  (1.676 m)   Wt (!) 329 lb (149.2 kg)   BMI 53.10 kg/m   Specialty Comments:  No specialty comments available.  PMFS History: Patient Active Problem List   Diagnosis Date Noted  . Iron deficiency anemia 01/29/2018  . Pulmonary embolus ([place]) 01/09/2018  . Encounter for therapeutic drug monitoring 01/09/2018  . Severe malnutrition ([place]) 08/24/2017  .  Acute lower UTI 12/29/2016  . Sepsis ([place]) 12/29/2016  . Acute kidney injury superimposed on CKD ([place]) 12/28/2016  . Atypical chest pain 12/28/2016  . Hx of skin graft 08/01/2016  . 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 ([place]) 07/25/2016  . 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 Trichomonal infection 11/24/2015  . Abdominal pain 05/02/2015  . Symptomatic cholelithiasis 04/16/2015  . Acute bilateral upper abdominal pain 04/16/2015  . Microcytic anemia 05/18/2013  . Hypotension, unspecified 05/17/2013  . CKD (chronic kidney  disease), stage III ([place]) 05/17/2013  . Hypokalemia 05/17/2013  . Anal fissure 02/06/2013  . Anal skin tag 02/06/2013  . ALLERGIC RHINITIS 03/05/2010  . LOW BACK PAIN SYNDROME 12/13/2007  . WOUND INFECTION 10/10/2007  . OBESITY, MORBID 12/02/2006  . HTN (hypertension) 12/02/2006  . History of pulmonary embolism 12/02/2006  . History of DVT (deep vein thrombosis) 12/02/2006  . 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 GASTROESOPHAGEAL REFLUX DISEASE 12/02/2006  . OSA (obstructive sleep apnea) 12/02/2006   Past Medical History:  Diagnosis Date  . Anemia   . Anxiety   . Chronic bronchitis ([place])   . Chronic upper back pain   . Depression   . DVT (deep venous thrombosis) (HCC)    BLE  . GERD (gastroesophageal reflux disease)   . Headache    "weekly" (04/16/2015)  . Hypertension   . Migraine    "monthly" (04/16/2015)  . Obesity   . Pneumonia 2014  . Pulmonary embolism ([place])   . Sinusitis nasal   . Sleep apnea    "I'm suppose to wear a mask but I don't" (04/16/2015)  . Varicose veins of right lower extremity   . Venous stasis of lower extremity    right    Family History  Problem Relation Age of Onset  . Kidney disease Mother        kidney transplant    Past Surgical History:  Procedure Laterality Date  . CHOLECYSTECTOMY N/A 04/18/2015   Procedure: LAPAROSCOPIC CHOLECYSTECTOMY;  Surgeon: Ralene Ok, MD;  Location: [facility];  Service: General;  Laterality: N/A;  . HYSTEROSCOPY W/D&C  12/24/2001   Archie Endo 09/28/2010  . I&D EXTREMITY Right 07/25/2016   Procedure: IRRIGATION AND DEBRIDEMENT RIGHT LEG ULCER, APPLY VERAFLO VAC;  Surgeon: [name], MD;  Location: [facility];  Service: Orthopedics;  Laterality: Right;  . INCISE AND DRAIN ABCESS Right 07/14/2016  . INCISION AND DRAINAGE Right 09/10/2008   leg:  skin and soft tissue and muscle/notes 09/15/2010  . INCISION AND DRAINAGE Right 01/01/2008   Chronic venous stasis insufficiency ulcer,/notes 09/14/2010/  . INCISION  AND DRAINAGE Right 10/5033   calf w/application wound vac/notes 07/20/2010  . INCISION AND DRAINAGE Right 07/25/2016   IRRIGATION AND DEBRIDEMENT RIGHT LEG ULCER,  . LAPAROSCOPIC GASTRIC BYPASS  ~ 2007  . SKIN GRAFT SPLIT THICKNESS LEG / FOOT Right 07/25/2016   LEG  . SKIN SPLIT GRAFT Right 07/27/2016   Procedure: SKIN GRAFT RIGHT LEG WITH THERASKIN APPLICATION;  Surgeon: [name], MD;  Location: [facility];  Service: Orthopedics;  Laterality: Right;  . TONSILLECTOMY     Social History   Occupational History  . Not on file  Tobacco Use  . Smoking status: Never Smoker  . Smokeless tobacco: Never Used  Substance and Sexual Activity  . Alcohol use: No  . Drug use: No  . Sexual activity: Yes    Partners: Male  Birth control/protection: I.U.D.

## 2018-07-20 ENCOUNTER — Encounter (INDEPENDENT_AMBULATORY_CARE_PROVIDER_SITE_OTHER): Payer: Self-pay | Admitting: Physician Assistant

## 2018-07-24 ENCOUNTER — Ambulatory Visit (INDEPENDENT_AMBULATORY_CARE_PROVIDER_SITE_OTHER): Payer: Medicare Other | Admitting: Bariatrics

## 2018-07-24 ENCOUNTER — Ambulatory Visit (INDEPENDENT_AMBULATORY_CARE_PROVIDER_SITE_OTHER): Payer: Medicare Other | Admitting: Physician Assistant

## 2018-07-25 ENCOUNTER — Ambulatory Visit (INDEPENDENT_AMBULATORY_CARE_PROVIDER_SITE_OTHER): Payer: Medicare Other | Admitting: Family Medicine

## 2018-07-25 ENCOUNTER — Ambulatory Visit (INDEPENDENT_AMBULATORY_CARE_PROVIDER_SITE_OTHER): Payer: Medicare Other | Admitting: Bariatrics

## 2018-07-25 ENCOUNTER — Encounter (INDEPENDENT_AMBULATORY_CARE_PROVIDER_SITE_OTHER): Payer: Self-pay

## 2018-08-02 ENCOUNTER — Ambulatory Visit (INDEPENDENT_AMBULATORY_CARE_PROVIDER_SITE_OTHER): Payer: Medicare Other | Admitting: Orthopedic Surgery

## 2018-08-02 ENCOUNTER — Other Ambulatory Visit: Payer: Self-pay

## 2018-08-02 ENCOUNTER — Encounter (INDEPENDENT_AMBULATORY_CARE_PROVIDER_SITE_OTHER): Payer: Self-pay | Admitting: Orthopedic Surgery

## 2018-08-02 DIAGNOSIS — L97919 Non-pressure chronic ulcer of unspecified part of right lower leg with unspecified severity: Secondary | ICD-10-CM

## 2018-08-02 DIAGNOSIS — I87331 Chronic venous hypertension (idiopathic) with ulcer and inflammation of right lower extremity: Secondary | ICD-10-CM

## 2018-08-02 MED ORDER — TRAMADOL HCL 50 MG PO TABS: 50.0000 mg | ORAL_TABLET | Freq: Four times a day (QID) | ORAL | 0 refills | Status: DC | PRN

## 2018-08-02 NOTE — Progress Notes (Signed)
Office Visit Note   Patient: Alice [name]           Date of Birth: Apr 07, 1971           MRN: [id number] Visit Date: 08/02/2018              Requested by: Benito Mccreedy, MD [address] [address], [place] [address] PCP: Benito Mccreedy, MD  Chief Complaint  Patient presents with  . Right Leg - Follow-up      HPI: Patient presents in follow-up for chronic venous ulcer right lower extremity.  Patient states the collagen dressing that was used did not have any benefit.  She states she does have pain.  Patient states that her weight loss clinic is closed due to the virus.  Assessment & Plan: Visit Diagnoses:  1. 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  Follow-up for reevaluation.  Follow-Up Instructions: Return in about 1 week (around 08/09/2018).   Ortho Exam  Patient is alert, oriented, no adenopathy, well-dressed, normal affect, normal respiratory effort. Examination the wound edges have healthy granulation tissue the wound bed is approximately 30% healthy granulation tissue 70% fibrinous exudative tissue the wound is flat there is no epiboly around the wound edges.  There is no signs or symptoms of infection no odor no drainage  Imaging: No results found. No images are attached to the encounter.  Labs: Lab Results  Component Value Date   REPTSTATUS 01/04/2017 FINAL 12/30/2016   REPTSTATUS 01/04/2017 FINAL 12/30/2016   GRAMSTAIN  05/17/2013    MODERATE WBC PRESENT, PREDOMINANTLY MONONUCLEAR MODERATE GRAM POSITIVE COCCI IN PAIRS IN CHAINS RARE GRAM NEGATIVE RODS Gram Stain Report Called to,Read Back By and Verified With: HOPPER,M RN @ [phone] 05/17/13 LEONARD,A   GRAMSTAIN  05/17/2013    MODERATE WBC PRESENT, PREDOMINANTLY MONONUCLEAR MODERATE SQUAMOUS EPITHELIAL CELLS PRESENT MODERATE GRAM POSITIVE COCCI IN  PAIRS IN CHAINS RARE GRAM NEGATIVE RODS Performed at [facility]   CULT NO GROWTH 5 DAYS 12/30/2016   CULT NO GROWTH 5 DAYS 12/30/2016   LABORGA ESCHERICHIA COLI (A) 12/29/2016     Lab Results  Component Value Date   ALBUMIN 3.2 (L) 01/29/2018   ALBUMIN 2.2 (L) 01/03/2017   ALBUMIN 2.1 (L) 01/02/2017    There is no height or weight on file to calculate BMI.  Orders:  No orders of the defined types were placed in this encounter.  Meds ordered this encounter  Medications  . traMADol (ULTRAM) 50 MG tablet    Sig: Take 1 tablet (50 mg total) by mouth every 6 (six) hours as needed for moderate pain.    Dispense:  30 tablet    Refill:  0     Procedures: No procedures performed  Clinical Data: No additional findings.  ROS:  All other systems negative, except as noted in the HPI. Review of Systems  Objective: Vital Signs: There were no vitals taken for this visit.  Specialty Comments:  No specialty comments available.  PMFS History: Patient Active Problem List   Diagnosis Date Noted  . Iron deficiency anemia 01/29/2018  . Pulmonary embolus ([place]) 01/09/2018  . Encounter for therapeutic drug monitoring 01/09/2018  . Severe malnutrition ([place]) 08/24/2017  . Acute lower UTI 12/29/2016  . Sepsis ([place]) 12/29/2016  . Acute kidney injury superimposed on CKD ([place]) 12/28/2016  . Atypical chest pain 12/28/2016  .  Hx of skin graft 08/01/2016  . 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 ([place]) 07/25/2016  . 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 Trichomonal infection 11/24/2015  . Abdominal pain 05/02/2015  . Symptomatic cholelithiasis 04/16/2015  . Acute bilateral upper abdominal pain 04/16/2015  . Microcytic anemia 05/18/2013  . Hypotension, unspecified 05/17/2013  . CKD (chronic kidney disease), stage III ([place]) 05/17/2013  . Hypokalemia 05/17/2013  . Anal fissure 02/06/2013   . Anal skin tag 02/06/2013  . ALLERGIC RHINITIS 03/05/2010  . LOW BACK PAIN SYNDROME 12/13/2007  . WOUND INFECTION 10/10/2007  . OBESITY, MORBID 12/02/2006  . HTN (hypertension) 12/02/2006  . History of pulmonary embolism 12/02/2006  . History of DVT (deep vein thrombosis) 12/02/2006  . 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 GASTROESOPHAGEAL REFLUX DISEASE 12/02/2006  . OSA (obstructive sleep apnea) 12/02/2006   Past Medical History:  Diagnosis Date  . Anemia   . Anxiety   . Chronic bronchitis ([place])   . Chronic upper back pain   . Depression   . DVT (deep venous thrombosis) (HCC)    BLE  . GERD (gastroesophageal reflux disease)   . Headache    "weekly" (04/16/2015)  . Hypertension   . Migraine    "monthly" (04/16/2015)  . Obesity   . Pneumonia 2014  . Pulmonary embolism ([place])   . Sinusitis nasal   . Sleep apnea    "I'm suppose to wear a mask but I don't" (04/16/2015)  . Varicose veins of right lower extremity   . Venous stasis of lower extremity    right    Family History  Problem Relation Age of Onset  . Kidney disease Mother        kidney transplant    Past Surgical History:  Procedure Laterality Date  . CHOLECYSTECTOMY N/A 04/18/2015   Procedure: LAPAROSCOPIC CHOLECYSTECTOMY;  Surgeon: Ralene Ok, MD;  Location: [facility];  Service: General;  Laterality: N/A;  . HYSTEROSCOPY W/D&C  12/24/2001   Archie Endo 09/28/2010  . I&D EXTREMITY Right 07/25/2016   Procedure: IRRIGATION AND DEBRIDEMENT RIGHT LEG ULCER, APPLY VERAFLO VAC;  Surgeon: [name], MD;  Location: [facility];  Service: Orthopedics;  Laterality: Right;  . INCISE AND DRAIN ABCESS Right 07/14/2016  . INCISION AND DRAINAGE Right 09/10/2008   leg:  skin and soft tissue and muscle/notes 09/15/2010  . INCISION AND DRAINAGE Right 01/01/2008   Chronic venous stasis insufficiency ulcer,/notes 09/14/2010/  . INCISION AND DRAINAGE Right 05/6107   calf w/application wound vac/notes 07/20/2010  . INCISION AND  DRAINAGE Right 07/25/2016   IRRIGATION AND DEBRIDEMENT RIGHT LEG ULCER,  . LAPAROSCOPIC GASTRIC BYPASS  ~ 2007  . SKIN GRAFT SPLIT THICKNESS LEG / FOOT Right 07/25/2016   LEG  . SKIN SPLIT GRAFT Right 07/27/2016   Procedure: SKIN GRAFT RIGHT LEG WITH THERASKIN APPLICATION;  Surgeon: [name], MD;  Location: [facility];  Service: Orthopedics;  Laterality: Right;  . TONSILLECTOMY     Social History   Occupational History  . Not on file  Tobacco Use  . Smoking status: Never Smoker  . Smokeless tobacco: Never Used  Substance and Sexual Activity  . Alcohol use: No  . Drug use: No  . Sexual activity: Yes    Partners: Male    Birth control/protection: I.U.D.

## 2018-08-06 ENCOUNTER — Ambulatory Visit (INDEPENDENT_AMBULATORY_CARE_PROVIDER_SITE_OTHER): Payer: Medicare Other | Admitting: Pharmacist

## 2018-08-06 ENCOUNTER — Ambulatory Visit (INDEPENDENT_AMBULATORY_CARE_PROVIDER_SITE_OTHER): Payer: Medicare Other | Admitting: Physician Assistant

## 2018-08-06 ENCOUNTER — Other Ambulatory Visit: Payer: Self-pay

## 2018-08-06 DIAGNOSIS — I2699 Other pulmonary embolism without acute cor pulmonale: Secondary | ICD-10-CM | POA: Diagnosis not present

## 2018-08-06 DIAGNOSIS — Z5181 Encounter for therapeutic drug level monitoring: Secondary | ICD-10-CM

## 2018-08-06 DIAGNOSIS — Z86718 Personal history of other venous thrombosis and embolism: Secondary | ICD-10-CM

## 2018-08-06 LAB — POCT INR: INR: 2.2 (ref 2.0–3.0)

## 2018-08-07 ENCOUNTER — Telehealth (INDEPENDENT_AMBULATORY_CARE_PROVIDER_SITE_OTHER): Payer: Self-pay

## 2018-08-07 NOTE — Telephone Encounter (Signed)
Patient was called and reschedule to come in at an earlier time and was screened and answered no to all questions.

## 2018-08-08 ENCOUNTER — Ambulatory Visit (INDEPENDENT_AMBULATORY_CARE_PROVIDER_SITE_OTHER): Payer: Medicare Other | Admitting: Bariatrics

## 2018-08-09 ENCOUNTER — Encounter (INDEPENDENT_AMBULATORY_CARE_PROVIDER_SITE_OTHER): Payer: Self-pay | Admitting: Orthopedic Surgery

## 2018-08-09 ENCOUNTER — Other Ambulatory Visit: Payer: Self-pay

## 2018-08-09 ENCOUNTER — Ambulatory Visit (INDEPENDENT_AMBULATORY_CARE_PROVIDER_SITE_OTHER): Payer: Medicare Other | Admitting: Orthopedic Surgery

## 2018-08-09 ENCOUNTER — Ambulatory Visit (INDEPENDENT_AMBULATORY_CARE_PROVIDER_SITE_OTHER): Payer: Medicare Other | Admitting: Physician Assistant

## 2018-08-09 VITALS — Ht 66.0 in | Wt 329.0 lb

## 2018-08-09 DIAGNOSIS — I87331 Chronic venous hypertension (idiopathic) with ulcer and inflammation of right lower extremity: Secondary | ICD-10-CM

## 2018-08-09 DIAGNOSIS — L97919 Non-pressure chronic ulcer of unspecified part of right lower leg with unspecified severity: Secondary | ICD-10-CM | POA: Diagnosis not present

## 2018-08-09 NOTE — Progress Notes (Signed)
Office Visit Note   Patient: Alice [name]           Date of Birth: 1970-11-26           MRN: [id number] Visit Date: 08/09/2018              Requested by: Benito Mccreedy, MD [place], [place] [address] PCP: Benito Mccreedy, MD  Chief Complaint  Patient presents with  . Right Leg - Follow-up      HPI: The patient is a 48 yo woman who is seen for follow up of her right lower leg ulceration. We used Thera Honey dressings on the area for the past week and the area does appear improved. She is concerned about the odor, but pleased with the results of the honey dressing.  Assessment & Plan: Visit Diagnoses:  1. 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 Follow up in 1 week.   Follow-Up Instructions: Return in about 1 week (around 08/16/2018).   Ortho Exam  Patient is alert, oriented, no adenopathy, well-dressed, normal affect, normal respiratory effort. There is improved pink granulation present over the wound bed ~ 70% and 30 % yellow fibrinous tissue present. There are no signs of infection or cellulitis. There is some odor, but again, no signs of infection otherwise.   Imaging: No results found.   Labs: Lab Results  Component Value Date   REPTSTATUS 01/04/2017 FINAL 12/30/2016   REPTSTATUS 01/04/2017 FINAL 12/30/2016   GRAMSTAIN  05/17/2013    MODERATE WBC PRESENT, PREDOMINANTLY MONONUCLEAR MODERATE GRAM POSITIVE COCCI IN PAIRS IN CHAINS RARE GRAM NEGATIVE RODS Gram Stain Report Called to,Read Back By and Verified With: HOPPER,M RN @ [phone] 05/17/13 LEONARD,A   GRAMSTAIN  05/17/2013    MODERATE WBC PRESENT, PREDOMINANTLY MONONUCLEAR MODERATE SQUAMOUS EPITHELIAL CELLS PRESENT MODERATE GRAM POSITIVE COCCI IN PAIRS IN CHAINS RARE GRAM NEGATIVE RODS Performed at [facility]   CULT NO GROWTH 5 DAYS 12/30/2016   CULT NO GROWTH 5 DAYS 12/30/2016   LABORGA ESCHERICHIA COLI (A) 12/29/2016     Lab Results  Component Value Date   ALBUMIN 3.2 (L) 01/29/2018   ALBUMIN 2.2 (L) 01/03/2017   ALBUMIN 2.1 (L) 01/02/2017    Body mass index is 53.1 kg/m.  Orders:  No orders of the defined types were placed in this encounter.  No orders of the defined types were placed in this encounter.    Procedures: No procedures performed  Clinical Data: No additional findings.  ROS:  All other systems negative, except as noted in the HPI. Review of Systems  Objective: Vital Signs: Ht 5\' 6"  (1.676 m)   Wt (!) 329 lb (149.2 kg)   BMI 53.10 kg/m   Specialty Comments:  No specialty comments available.  PMFS History: Patient Active Problem List   Diagnosis Date Noted  . Iron deficiency anemia 01/29/2018  . Pulmonary embolus ([place]) 01/09/2018  . Encounter for therapeutic drug monitoring 01/09/2018  . Severe malnutrition ([place]) 08/24/2017  . Acute lower UTI 12/29/2016  . Sepsis ([place]) 12/29/2016  . Acute kidney injury superimposed on CKD ([place]) 12/28/2016  . Atypical chest pain 12/28/2016  . Hx of skin graft 08/01/2016  . 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 ([place]) 07/25/2016  . 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  Trichomonal infection 11/24/2015  . Abdominal pain 05/02/2015  . Symptomatic cholelithiasis 04/16/2015  . Acute bilateral upper abdominal pain 04/16/2015  . Microcytic anemia 05/18/2013  . Hypotension, unspecified 05/17/2013  . CKD (chronic kidney disease), stage III ([place]) 05/17/2013  . Hypokalemia 05/17/2013  . Anal fissure 02/06/2013  . Anal skin tag 02/06/2013  . ALLERGIC RHINITIS 03/05/2010  . LOW BACK PAIN SYNDROME 12/13/2007  . WOUND INFECTION 10/10/2007  . OBESITY, MORBID 12/02/2006  . HTN (hypertension) 12/02/2006  . History of pulmonary embolism 12/02/2006  . History of DVT  (deep vein thrombosis) 12/02/2006  . 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 GASTROESOPHAGEAL REFLUX DISEASE 12/02/2006  . OSA (obstructive sleep apnea) 12/02/2006   Past Medical History:  Diagnosis Date  . Anemia   . Anxiety   . Chronic bronchitis ([place])   . Chronic upper back pain   . Depression   . DVT (deep venous thrombosis) (HCC)    BLE  . GERD (gastroesophageal reflux disease)   . Headache    "weekly" (04/16/2015)  . Hypertension   . Migraine    "monthly" (04/16/2015)  . Obesity   . Pneumonia 2014  . Pulmonary embolism ([place])   . Sinusitis nasal   . Sleep apnea    "I'm suppose to wear a mask but I don't" (04/16/2015)  . Varicose veins of right lower extremity   . Venous stasis of lower extremity    right    Family History  Problem Relation Age of Onset  . Kidney disease Mother        kidney transplant    Past Surgical History:  Procedure Laterality Date  . CHOLECYSTECTOMY N/A 04/18/2015   Procedure: LAPAROSCOPIC CHOLECYSTECTOMY;  Surgeon: Ralene Ok, MD;  Location: [facility];  Service: General;  Laterality: N/A;  . HYSTEROSCOPY W/D&C  12/24/2001   Archie Endo 09/28/2010  . I&D EXTREMITY Right 07/25/2016   Procedure: IRRIGATION AND DEBRIDEMENT RIGHT LEG ULCER, APPLY VERAFLO VAC;  Surgeon: [name], MD;  Location: [facility];  Service: Orthopedics;  Laterality: Right;  . INCISE AND DRAIN ABCESS Right 07/14/2016  . INCISION AND DRAINAGE Right 09/10/2008   leg:  skin and soft tissue and muscle/notes 09/15/2010  . INCISION AND DRAINAGE Right 01/01/2008   Chronic venous stasis insufficiency ulcer,/notes 09/14/2010/  . INCISION AND DRAINAGE Right 06/4823   calf w/application wound vac/notes 07/20/2010  . INCISION AND DRAINAGE Right 07/25/2016   IRRIGATION AND DEBRIDEMENT RIGHT LEG ULCER,  . LAPAROSCOPIC GASTRIC BYPASS  ~ 2007  . SKIN GRAFT SPLIT THICKNESS LEG / FOOT Right 07/25/2016   LEG  . SKIN SPLIT GRAFT Right 07/27/2016   Procedure: SKIN GRAFT RIGHT LEG WITH  THERASKIN APPLICATION;  Surgeon: [name], MD;  Location: [facility];  Service: Orthopedics;  Laterality: Right;  . TONSILLECTOMY     Social History   Occupational History  . Not on file  Tobacco Use  . Smoking status: Never Smoker  . Smokeless tobacco: Never Used  Substance and Sexual Activity  . Alcohol use: No  . Drug use: No  . Sexual activity: Yes    Partners: Male    Birth control/protection: I.U.D.

## 2018-08-10 DIAGNOSIS — E559 Vitamin D deficiency, unspecified: Secondary | ICD-10-CM | POA: Diagnosis not present

## 2018-08-10 DIAGNOSIS — F329 Major depressive disorder, single episode, unspecified: Secondary | ICD-10-CM | POA: Diagnosis not present

## 2018-08-10 DIAGNOSIS — G629 Polyneuropathy, unspecified: Secondary | ICD-10-CM | POA: Diagnosis not present

## 2018-08-10 DIAGNOSIS — I10 Essential (primary) hypertension: Secondary | ICD-10-CM | POA: Diagnosis not present

## 2018-08-10 DIAGNOSIS — Z131 Encounter for screening for diabetes mellitus: Secondary | ICD-10-CM | POA: Diagnosis not present

## 2018-08-10 DIAGNOSIS — G4733 Obstructive sleep apnea (adult) (pediatric): Secondary | ICD-10-CM | POA: Diagnosis not present

## 2018-08-10 DIAGNOSIS — R7303 Prediabetes: Secondary | ICD-10-CM | POA: Diagnosis not present

## 2018-08-10 DIAGNOSIS — Z0101 Encounter for examination of eyes and vision with abnormal findings: Secondary | ICD-10-CM | POA: Diagnosis not present

## 2018-08-10 DIAGNOSIS — R1013 Epigastric pain: Secondary | ICD-10-CM | POA: Diagnosis not present

## 2018-08-10 DIAGNOSIS — Z0110 Encounter for examination of ears and hearing without abnormal findings: Secondary | ICD-10-CM | POA: Diagnosis not present

## 2018-08-13 ENCOUNTER — Telehealth (INDEPENDENT_AMBULATORY_CARE_PROVIDER_SITE_OTHER): Payer: Self-pay | Admitting: *Deleted

## 2018-08-13 NOTE — Telephone Encounter (Signed)
Called pt and lvm for her to call to answer covid-19 pre-screening questions.

## 2018-08-14 ENCOUNTER — Ambulatory Visit (INDEPENDENT_AMBULATORY_CARE_PROVIDER_SITE_OTHER): Payer: Medicare Other | Admitting: Orthopedic Surgery

## 2018-08-20 ENCOUNTER — Telehealth (INDEPENDENT_AMBULATORY_CARE_PROVIDER_SITE_OTHER): Payer: Self-pay

## 2018-08-20 NOTE — Telephone Encounter (Signed)
Lm on vm to ask prescreen COVID-19 questions. Advised to call back.

## 2018-08-21 ENCOUNTER — Encounter (INDEPENDENT_AMBULATORY_CARE_PROVIDER_SITE_OTHER): Payer: Self-pay | Admitting: Orthopedic Surgery

## 2018-08-21 ENCOUNTER — Ambulatory Visit (INDEPENDENT_AMBULATORY_CARE_PROVIDER_SITE_OTHER): Payer: Medicare Other | Admitting: Physician Assistant

## 2018-08-21 ENCOUNTER — Other Ambulatory Visit: Payer: Self-pay

## 2018-08-21 VITALS — Ht 66.0 in | Wt 329.0 lb

## 2018-08-21 DIAGNOSIS — L97919 Non-pressure chronic ulcer of unspecified part of right lower leg with unspecified severity: Secondary | ICD-10-CM | POA: Diagnosis not present

## 2018-08-21 DIAGNOSIS — I87331 Chronic venous hypertension (idiopathic) with ulcer and inflammation of right lower extremity: Secondary | ICD-10-CM

## 2018-08-21 NOTE — Progress Notes (Signed)
Office Visit Note   Patient: Alice [name]           Date of Birth: [birth year]           MRN: [id number] Visit Date: 08/21/2018              Requested by: Benito Mccreedy, MD [address] [address] [place], [place] [address] PCP: Benito Mccreedy, MD  Chief Complaint  Patient presents with  . Right Leg - Follow-up      HPI: The patient is a 48 yo woman who is seen for follow up of her right lower leg ulceration. She has been using Thera honey to the wound bed, but reports worsening of the odor and would like to try something different for the next week. She reports copious drainage from the area with the honey dressing.  She reports she was starting the gastric bypass education classes but had to stop due to the Covid 19 restrictions .   Assessment & Plan: Visit Diagnoses:  1. 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  Follow up in 1 week.   Follow-Up Instructions: Return in about 1 week (around 08/28/2018).   Ortho Exam  Patient is alert, oriented, no adenopathy, well-dressed, normal affect, normal respiratory effort. The wound bed is about 60 % granulatioin, 40 % yellow fibrinous tissue. No peri wound irritation or signs of infection or cellulitis. There is odor and moderate serous weeping. Overall wound size is unchanged.     Imaging: No results found. No images are attached to the encounter.  Labs: Lab Results  Component Value Date   REPTSTATUS 01/04/2017 FINAL 12/30/2016   REPTSTATUS 01/04/2017 FINAL 12/30/2016   GRAMSTAIN  05/17/2013    MODERATE WBC PRESENT, PREDOMINANTLY MONONUCLEAR MODERATE GRAM POSITIVE COCCI IN PAIRS IN CHAINS RARE GRAM NEGATIVE RODS Gram Stain Report Called to,Read Back By and Verified With: HOPPER,M RN @ [phone] 05/17/13 LEONARD,A   GRAMSTAIN  05/17/2013    MODERATE WBC  PRESENT, PREDOMINANTLY MONONUCLEAR MODERATE SQUAMOUS EPITHELIAL CELLS PRESENT MODERATE GRAM POSITIVE COCCI IN PAIRS IN CHAINS RARE GRAM NEGATIVE RODS Performed at [facility]   CULT NO GROWTH 5 DAYS 12/30/2016   CULT NO GROWTH 5 DAYS 12/30/2016   LABORGA ESCHERICHIA COLI (A) 12/29/2016     Lab Results  Component Value Date   ALBUMIN 3.2 (L) 01/29/2018   ALBUMIN 2.2 (L) 01/03/2017   ALBUMIN 2.1 (L) 01/02/2017    Body mass index is 53.1 kg/m.  Orders:  No orders of the defined types were placed in this encounter.  No orders of the defined types were placed in this encounter.    Procedures: No procedures performed  Clinical Data: No additional findings.  ROS:  All other systems negative, except as noted in the HPI. Review of Systems  Objective: Vital Signs: Ht 5\' 6"  (1.676 m)   Wt (!) 329 lb (149.2 kg)   BMI 53.10 kg/m   Specialty Comments:  No specialty comments available.  PMFS History: Patient Active Problem List   Diagnosis Date Noted  . Iron deficiency anemia 01/29/2018  . Pulmonary embolus ([place]) 01/09/2018  . Encounter for therapeutic drug monitoring 01/09/2018  . Severe malnutrition ([place]) 08/24/2017  . Acute lower UTI 12/29/2016  . Sepsis ([place]) 12/29/2016  . Acute kidney injury superimposed on CKD ([place]) 12/28/2016  .  Atypical chest pain 12/28/2016  . Hx of skin graft 08/01/2016  . 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 ([place]) 07/25/2016  . 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 Trichomonal infection 11/24/2015  . Abdominal pain 05/02/2015  . Symptomatic cholelithiasis 04/16/2015  . Acute bilateral upper abdominal pain 04/16/2015  . Microcytic anemia 05/18/2013  . Hypotension, unspecified 05/17/2013  . CKD (chronic kidney disease), stage III ([place]) 05/17/2013  . Hypokalemia 05/17/2013  . Anal fissure 02/06/2013  . Anal skin tag 02/06/2013  .  ALLERGIC RHINITIS 03/05/2010  . LOW BACK PAIN SYNDROME 12/13/2007  . WOUND INFECTION 10/10/2007  . OBESITY, MORBID 12/02/2006  . HTN (hypertension) 12/02/2006  . History of pulmonary embolism 12/02/2006  . History of DVT (deep vein thrombosis) 12/02/2006  . 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 GASTROESOPHAGEAL REFLUX DISEASE 12/02/2006  . OSA (obstructive sleep apnea) 12/02/2006   Past Medical History:  Diagnosis Date  . Anemia   . Anxiety   . Chronic bronchitis ([place])   . Chronic upper back pain   . Depression   . DVT (deep venous thrombosis) (HCC)    BLE  . GERD (gastroesophageal reflux disease)   . Headache    "weekly" (04/16/2015)  . Hypertension   . Migraine    "monthly" (04/16/2015)  . Obesity   . Pneumonia 2014  . Pulmonary embolism ([place])   . Sinusitis nasal   . Sleep apnea    "I'm suppose to wear a mask but I don't" (04/16/2015)  . Varicose veins of right lower extremity   . Venous stasis of lower extremity    right    Family History  Problem Relation Age of Onset  . Kidney disease Mother        kidney transplant    Past Surgical History:  Procedure Laterality Date  . CHOLECYSTECTOMY N/A 04/18/2015   Procedure: LAPAROSCOPIC CHOLECYSTECTOMY;  Surgeon: Ralene Ok, MD;  Location: [facility];  Service: General;  Laterality: N/A;  . HYSTEROSCOPY W/D&C  12/24/2001   Archie Endo 09/28/2010  . I&D EXTREMITY Right 07/25/2016   Procedure: IRRIGATION AND DEBRIDEMENT RIGHT LEG ULCER, APPLY VERAFLO VAC;  Surgeon: [name], MD;  Location: [facility];  Service: Orthopedics;  Laterality: Right;  . INCISE AND DRAIN ABCESS Right 07/14/2016  . INCISION AND DRAINAGE Right 09/10/2008   leg:  skin and soft tissue and muscle/notes 09/15/2010  . INCISION AND DRAINAGE Right 01/01/2008   Chronic venous stasis insufficiency ulcer,/notes 09/14/2010/  . INCISION AND DRAINAGE Right 06/3298   calf w/application wound vac/notes 07/20/2010  . INCISION AND DRAINAGE Right 07/25/2016    IRRIGATION AND DEBRIDEMENT RIGHT LEG ULCER,  . LAPAROSCOPIC GASTRIC BYPASS  ~ 2007  . SKIN GRAFT SPLIT THICKNESS LEG / FOOT Right 07/25/2016   LEG  . SKIN SPLIT GRAFT Right 07/27/2016   Procedure: SKIN GRAFT RIGHT LEG WITH THERASKIN APPLICATION;  Surgeon: [name], MD;  Location: [facility];  Service: Orthopedics;  Laterality: Right;  . TONSILLECTOMY     Social History   Occupational History  . Not on file  Tobacco Use  . Smoking status: Never Smoker  . Smokeless tobacco: Never Used  Substance and Sexual Activity  . Alcohol use: No  . Drug use: No  . Sexual activity: Yes    Partners: Male    Birth control/protection: I.U.D.

## 2018-08-28 ENCOUNTER — Encounter (INDEPENDENT_AMBULATORY_CARE_PROVIDER_SITE_OTHER): Payer: Self-pay | Admitting: Orthopedic Surgery

## 2018-08-28 ENCOUNTER — Other Ambulatory Visit: Payer: Self-pay

## 2018-08-28 ENCOUNTER — Ambulatory Visit (INDEPENDENT_AMBULATORY_CARE_PROVIDER_SITE_OTHER): Payer: Medicare Other | Admitting: Physician Assistant

## 2018-08-28 VITALS — Ht 66.0 in | Wt 329.0 lb

## 2018-08-28 DIAGNOSIS — L97919 Non-pressure chronic ulcer of unspecified part of right lower leg with unspecified severity: Secondary | ICD-10-CM

## 2018-08-28 DIAGNOSIS — I87331 Chronic venous hypertension (idiopathic) with ulcer and inflammation of right lower extremity: Secondary | ICD-10-CM

## 2018-08-28 NOTE — Progress Notes (Signed)
Office Visit Note   Patient: Alice [name]           Date of Birth: Sep 04, 1970           MRN: [id number] Visit Date: 08/28/2018              Requested by: Benito Mccreedy, MD [address] [place], [place] [address] PCP: Benito Mccreedy, MD  Chief Complaint  Patient presents with  . Right Leg - Follow-up      HPI: The patient is a 48 yo woman who is seen for follow up of her right lower extremity ulceration. She reports less drainage and odor with silver alginate over the wound bed for the past week. She reports her gastric bypass surgery is on hold due to Covid 19 restrictions and she is discouraged today about when the surgery will be able to proceed. She reports she is continuing to get adequate protein in her diet and trying to lose weight.   Assessment & Plan: Visit Diagnoses:  1. 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 She will follow up in 1 week.   Follow-Up Instructions: Return in about 1 week (around 09/04/2018).   Ortho Exam  Patient is alert, oriented, no adenopathy, well-dressed, normal affect, normal respiratory effort. The right lower leg ulceration wound bed has increased granulation today ~ 70% and 30% yellow slough. No epiboly. No signs of cellulitis or peri wound irritation. Decreased serous weeping today.   Imaging: No results found.   Labs: Lab Results  Component Value Date   REPTSTATUS 01/04/2017 FINAL 12/30/2016   REPTSTATUS 01/04/2017 FINAL 12/30/2016   GRAMSTAIN  05/17/2013    MODERATE WBC PRESENT, PREDOMINANTLY MONONUCLEAR MODERATE GRAM POSITIVE COCCI IN PAIRS IN CHAINS RARE GRAM NEGATIVE RODS Gram Stain Report Called to,Read Back By and Verified With: HOPPER,M RN @ [phone] 05/17/13 LEONARD,A   GRAMSTAIN  05/17/2013    MODERATE WBC PRESENT, PREDOMINANTLY MONONUCLEAR  MODERATE SQUAMOUS EPITHELIAL CELLS PRESENT MODERATE GRAM POSITIVE COCCI IN PAIRS IN CHAINS RARE GRAM NEGATIVE RODS Performed at [facility]   CULT NO GROWTH 5 DAYS 12/30/2016   CULT NO GROWTH 5 DAYS 12/30/2016   LABORGA ESCHERICHIA COLI (A) 12/29/2016     Lab Results  Component Value Date   ALBUMIN 3.2 (L) 01/29/2018   ALBUMIN 2.2 (L) 01/03/2017   ALBUMIN 2.1 (L) 01/02/2017    Body mass index is 53.1 kg/m.  Orders:  No orders of the defined types were placed in this encounter.  No orders of the defined types were placed in this encounter.    Procedures: No procedures performed  Clinical Data: No additional findings.  ROS:  All other systems negative, except as noted in the HPI. Review of Systems  Objective: Vital Signs: Ht 5\' 6"  (1.676 m)   Wt (!) 329 lb (149.2 kg)   BMI 53.10 kg/m   Specialty Comments:  No specialty comments available.  PMFS History: Patient Active Problem List   Diagnosis Date Noted  . Iron deficiency anemia 01/29/2018  . Pulmonary embolus ([place]) 01/09/2018  . Encounter for therapeutic drug monitoring 01/09/2018  . Severe malnutrition ([place]) 08/24/2017  . Acute lower UTI 12/29/2016  . Sepsis ([place]) 12/29/2016  . Acute kidney injury superimposed on CKD ([place]) 12/28/2016  . Atypical chest pain 12/28/2016  . Hx of skin graft 08/01/2016  .  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 ([place]) 07/25/2016  . 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 Trichomonal infection 11/24/2015  . Abdominal pain 05/02/2015  . Symptomatic cholelithiasis 04/16/2015  . Acute bilateral upper abdominal pain 04/16/2015  . Microcytic anemia 05/18/2013  . Hypotension, unspecified 05/17/2013  . CKD (chronic kidney disease), stage III ([place]) 05/17/2013  . Hypokalemia 05/17/2013  . Anal fissure 02/06/2013  . Anal skin tag 02/06/2013  . ALLERGIC RHINITIS 03/05/2010  . LOW  BACK PAIN SYNDROME 12/13/2007  . WOUND INFECTION 10/10/2007  . OBESITY, MORBID 12/02/2006  . HTN (hypertension) 12/02/2006  . History of pulmonary embolism 12/02/2006  . History of DVT (deep vein thrombosis) 12/02/2006  . 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 GASTROESOPHAGEAL REFLUX DISEASE 12/02/2006  . OSA (obstructive sleep apnea) 12/02/2006   Past Medical History:  Diagnosis Date  . Anemia   . Anxiety   . Chronic bronchitis ([place])   . Chronic upper back pain   . Depression   . DVT (deep venous thrombosis) (HCC)    BLE  . GERD (gastroesophageal reflux disease)   . Headache    "weekly" (04/16/2015)  . Hypertension   . Migraine    "monthly" (04/16/2015)  . Obesity   . Pneumonia 2014  . Pulmonary embolism ([place])   . Sinusitis nasal   . Sleep apnea    "I'm suppose to wear a mask but I don't" (04/16/2015)  . Varicose veins of right lower extremity   . Venous stasis of lower extremity    right    Family History  Problem Relation Age of Onset  . Kidney disease Mother        kidney transplant    Past Surgical History:  Procedure Laterality Date  . CHOLECYSTECTOMY N/A 04/18/2015   Procedure: LAPAROSCOPIC CHOLECYSTECTOMY;  Surgeon: Ralene Ok, MD;  Location: [facility];  Service: General;  Laterality: N/A;  . HYSTEROSCOPY W/D&C  12/24/2001   Archie Endo 09/28/2010  . I&D EXTREMITY Right 07/25/2016   Procedure: IRRIGATION AND DEBRIDEMENT RIGHT LEG ULCER, APPLY VERAFLO VAC;  Surgeon: [name], MD;  Location: [facility];  Service: Orthopedics;  Laterality: Right;  . INCISE AND DRAIN ABCESS Right 07/14/2016  . INCISION AND DRAINAGE Right 09/10/2008   leg:  skin and soft tissue and muscle/notes 09/15/2010  . INCISION AND DRAINAGE Right 01/01/2008   Chronic venous stasis insufficiency ulcer,/notes 09/14/2010/  . INCISION AND DRAINAGE Right 07/8180   calf w/application wound vac/notes 07/20/2010  . INCISION AND DRAINAGE Right 07/25/2016   IRRIGATION AND DEBRIDEMENT RIGHT LEG ULCER,   . LAPAROSCOPIC GASTRIC BYPASS  ~ 2007  . SKIN GRAFT SPLIT THICKNESS LEG / FOOT Right 07/25/2016   LEG  . SKIN SPLIT GRAFT Right 07/27/2016   Procedure: SKIN GRAFT RIGHT LEG WITH THERASKIN APPLICATION;  Surgeon: [name], MD;  Location: [facility];  Service: Orthopedics;  Laterality: Right;  . TONSILLECTOMY     Social History   Occupational History  . Not on file  Tobacco Use  . Smoking status: Never Smoker  . Smokeless tobacco: Never Used  Substance and Sexual Activity  . Alcohol use: No  . Drug use: No  . Sexual activity: Yes    Partners: Male    Birth control/protection: I.U.D.

## 2018-08-31 ENCOUNTER — Other Ambulatory Visit: Payer: Self-pay | Admitting: Cardiology

## 2018-09-04 ENCOUNTER — Ambulatory Visit (INDEPENDENT_AMBULATORY_CARE_PROVIDER_SITE_OTHER): Payer: Medicare Other | Admitting: Orthopedic Surgery

## 2018-09-11 ENCOUNTER — Encounter (INDEPENDENT_AMBULATORY_CARE_PROVIDER_SITE_OTHER): Payer: Self-pay | Admitting: Orthopedic Surgery

## 2018-09-11 ENCOUNTER — Ambulatory Visit (INDEPENDENT_AMBULATORY_CARE_PROVIDER_SITE_OTHER): Payer: Medicare Other | Admitting: Orthopedic Surgery

## 2018-09-11 ENCOUNTER — Other Ambulatory Visit: Payer: Self-pay

## 2018-09-11 VITALS — Ht 66.0 in | Wt 329.0 lb

## 2018-09-11 DIAGNOSIS — Z6841 Body Mass Index (BMI) 40.0 and over, adult: Secondary | ICD-10-CM

## 2018-09-11 DIAGNOSIS — E440 Moderate protein-calorie malnutrition: Secondary | ICD-10-CM

## 2018-09-11 DIAGNOSIS — Z945 Skin transplant status: Secondary | ICD-10-CM

## 2018-09-11 DIAGNOSIS — L97919 Non-pressure chronic ulcer of unspecified part of right lower leg with unspecified severity: Secondary | ICD-10-CM

## 2018-09-11 DIAGNOSIS — I87331 Chronic venous hypertension (idiopathic) with ulcer and inflammation of right lower extremity: Secondary | ICD-10-CM | POA: Diagnosis not present

## 2018-09-11 NOTE — Progress Notes (Signed)
Office Visit Note   Patient: Alice [name]           Date of Birth: November 06, 1970           MRN: [id number] Visit Date: 09/11/2018              Requested by: Benito Mccreedy, MD [address], [place] [address] PCP: Benito Mccreedy, MD  Chief Complaint  Patient presents with  . Right Leg - Wound Check      HPI: Patient is a 48 year old woman who presents in follow-up for chronic venous insufficiency ulcer right lower extremity.  Patient states she is losing weight she is pleased with improved granulation of the wound bed.  Assessment & Plan: Visit Diagnoses:  1. 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 BMI 50.0-59.9, adult (HCC)   3. mild protein calorie malnutrition   4. Hx of skin graft     Plan: Discussed with the patient with the prolonged nature of the wound and the longstanding biofilm that is preventing healing of recommended proceeding with surgical debridement of the wound sending the tissue for cultures placing her on IV antibiotics for at least 3 weeks with skin grafting to the affected area.  Discussed that she would be hospitalized for approximately 5 days to obtain for wound cultures.  Patient states she would like to think about this and will let us know her plans at follow-up in 2 weeks.  Continue with compression wraps.  Follow-Up Instructions: Return in about 2 weeks (around 09/25/2018).   Ortho Exam  Patient is alert, oriented, no adenopathy, well-dressed, normal affect, normal respiratory effort. Examination the wound bed has good healthy granulation tissue there is no cellulitis no odor no drainage no exposed bone or tendon.  There is no hyper granulation tissue.  Imaging: No results found. No images are attached to the encounter.  Labs: Lab Results  Component Value Date   REPTSTATUS 01/04/2017 FINAL 12/30/2016   REPTSTATUS 01/04/2017 FINAL 12/30/2016   GRAMSTAIN   05/17/2013    MODERATE WBC PRESENT, PREDOMINANTLY MONONUCLEAR MODERATE GRAM POSITIVE COCCI IN PAIRS IN CHAINS RARE GRAM NEGATIVE RODS Gram Stain Report Called to,Read Back By and Verified With: HOPPER,M RN @ [phone] 05/17/13 LEONARD,A   GRAMSTAIN  05/17/2013    MODERATE WBC PRESENT, PREDOMINANTLY MONONUCLEAR MODERATE SQUAMOUS EPITHELIAL CELLS PRESENT MODERATE GRAM POSITIVE COCCI IN PAIRS IN CHAINS RARE GRAM NEGATIVE RODS Performed at [facility]   CULT NO GROWTH 5 DAYS 12/30/2016   CULT NO GROWTH 5 DAYS 12/30/2016   LABORGA ESCHERICHIA COLI (A) 12/29/2016     Lab Results  Component Value Date   ALBUMIN 3.2 (L) 01/29/2018   ALBUMIN 2.2 (L) 01/03/2017   ALBUMIN 2.1 (L) 01/02/2017    Body mass index is 53.1 kg/m.  Orders:  No orders of the defined types were placed in this encounter.  No orders of the defined types were placed in this encounter.    Procedures: No procedures performed  Clinical Data: No additional findings.  ROS:  All other systems negative, except as noted in the HPI. Review of Systems  Objective: Vital Signs: Ht 5\' 6"  (1.676 m)   Wt (!) 329 lb (149.2 kg)   BMI 53.10 kg/m   Specialty Comments:  No specialty comments available.  PMFS History: Patient Active Problem List   Diagnosis Date Noted  . Iron deficiency anemia 01/29/2018  . Pulmonary embolus ([place]) 01/09/2018  .  Encounter for therapeutic drug monitoring 01/09/2018  . Severe malnutrition ([place]) 08/24/2017  . Acute lower UTI 12/29/2016  . Sepsis ([place]) 12/29/2016  . Acute kidney injury superimposed on CKD ([place]) 12/28/2016  . Atypical chest pain 12/28/2016  . Hx of skin graft 08/01/2016  . 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 ([place]) 07/25/2016  . 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 Trichomonal infection 11/24/2015  . Abdominal pain 05/02/2015  . Symptomatic cholelithiasis 04/16/2015  .  Acute bilateral upper abdominal pain 04/16/2015  . Microcytic anemia 05/18/2013  . Hypotension, unspecified 05/17/2013  . CKD (chronic kidney disease), stage III ([place]) 05/17/2013  . Hypokalemia 05/17/2013  . Anal fissure 02/06/2013  . Anal skin tag 02/06/2013  . ALLERGIC RHINITIS 03/05/2010  . LOW BACK PAIN SYNDROME 12/13/2007  . WOUND INFECTION 10/10/2007  . OBESITY, MORBID 12/02/2006  . HTN (hypertension) 12/02/2006  . History of pulmonary embolism 12/02/2006  . History of DVT (deep vein thrombosis) 12/02/2006  . 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 GASTROESOPHAGEAL REFLUX DISEASE 12/02/2006  . OSA (obstructive sleep apnea) 12/02/2006   Past Medical History:  Diagnosis Date  . Anemia   . Anxiety   . Chronic bronchitis ([place])   . Chronic upper back pain   . Depression   . DVT (deep venous thrombosis) (HCC)    BLE  . GERD (gastroesophageal reflux disease)   . Headache    "weekly" (04/16/2015)  . Hypertension   . Migraine    "monthly" (04/16/2015)  . Obesity   . Pneumonia 2014  . Pulmonary embolism ([place])   . Sinusitis nasal   . Sleep apnea    "I'm suppose to wear a mask but I don't" (04/16/2015)  . Varicose veins of right lower extremity   . Venous stasis of lower extremity    right    Family History  Problem Relation Age of Onset  . Kidney disease Mother        kidney transplant    Past Surgical History:  Procedure Laterality Date  . CHOLECYSTECTOMY N/A 04/18/2015   Procedure: LAPAROSCOPIC CHOLECYSTECTOMY;  Surgeon: Ralene Ok, MD;  Location: [facility];  Service: General;  Laterality: N/A;  . HYSTEROSCOPY W/D&C  12/24/2001   Archie Endo 09/28/2010  . I&D EXTREMITY Right 07/25/2016   Procedure: IRRIGATION AND DEBRIDEMENT RIGHT LEG ULCER, APPLY VERAFLO VAC;  Surgeon: [name], MD;  Location: [facility];  Service: Orthopedics;  Laterality: Right;  . INCISE AND DRAIN ABCESS Right 07/14/2016  . INCISION AND DRAINAGE Right 09/10/2008   leg:  skin and soft  tissue and muscle/notes 09/15/2010  . INCISION AND DRAINAGE Right 01/01/2008   Chronic venous stasis insufficiency ulcer,/notes 09/14/2010/  . INCISION AND DRAINAGE Right 10/3873   calf w/application wound vac/notes 07/20/2010  . INCISION AND DRAINAGE Right 07/25/2016   IRRIGATION AND DEBRIDEMENT RIGHT LEG ULCER,  . LAPAROSCOPIC GASTRIC BYPASS  ~ 2007  . SKIN GRAFT SPLIT THICKNESS LEG / FOOT Right 07/25/2016   LEG  . SKIN SPLIT GRAFT Right 07/27/2016   Procedure: SKIN GRAFT RIGHT LEG WITH THERASKIN APPLICATION;  Surgeon: [name], MD;  Location: [facility];  Service: Orthopedics;  Laterality: Right;  . TONSILLECTOMY     Social History   Occupational History  . Not on file  Tobacco Use  . Smoking status: Never Smoker  . Smokeless tobacco: Never Used  Substance and Sexual Activity  . Alcohol use: No  .  Drug use: No  . Sexual activity: Yes    Partners: Male    Birth control/protection: I.U.D.

## 2018-09-17 ENCOUNTER — Telehealth: Payer: Self-pay

## 2018-09-17 NOTE — Telephone Encounter (Signed)

## 2018-09-25 ENCOUNTER — Encounter: Payer: Self-pay | Admitting: Orthopedic Surgery

## 2018-09-25 ENCOUNTER — Ambulatory Visit (INDEPENDENT_AMBULATORY_CARE_PROVIDER_SITE_OTHER): Payer: Medicare Other | Admitting: Orthopedic Surgery

## 2018-09-25 ENCOUNTER — Other Ambulatory Visit: Payer: Self-pay

## 2018-09-25 VITALS — Ht 66.0 in | Wt 329.0 lb

## 2018-09-25 DIAGNOSIS — L97912 Non-pressure chronic ulcer of unspecified part of right lower leg with fat layer exposed: Secondary | ICD-10-CM | POA: Diagnosis not present

## 2018-09-25 DIAGNOSIS — L97919 Non-pressure chronic ulcer of unspecified part of right lower leg with unspecified severity: Secondary | ICD-10-CM

## 2018-09-25 DIAGNOSIS — I87331 Chronic venous hypertension (idiopathic) with ulcer and inflammation of right lower extremity: Secondary | ICD-10-CM | POA: Diagnosis not present

## 2018-09-26 ENCOUNTER — Encounter: Payer: Self-pay | Admitting: Orthopedic Surgery

## 2018-09-26 ENCOUNTER — Other Ambulatory Visit (HOSPITAL_COMMUNITY)
Admission: RE | Admit: 2018-09-26 | Discharge: 2018-09-26 | Disposition: A | Discharge status: Home / Self Care | Payer: Medicare Other | Source: Ambulatory Visit | Attending: Orthopedic Surgery | Admitting: Orthopedic Surgery

## 2018-09-26 DIAGNOSIS — Z1159 Encounter for screening for other viral diseases: Secondary | ICD-10-CM | POA: Insufficient documentation

## 2018-09-26 LAB — SARS CORONAVIRUS 2 BY RT PCR (HOSPITAL ORDER, PERFORMED IN ~~LOC~~ HOSPITAL LAB): SARS Coronavirus 2: NEGATIVE

## 2018-09-26 NOTE — Progress Notes (Signed)
Office Visit Note   Patient: Alice [name]           Date of Birth: [birth year]           MRN: [id number] Visit Date: 09/25/2018              Requested by: Benito Mccreedy, MD [address] [place], [place] [address] PCP: Benito Mccreedy, MD  Chief Complaint  Patient presents with  . Right Leg - Follow-up      HPI: Patient is a 48 year old woman with a chronic venous insufficiency of her right leg.  She is undergone prolonged conservative therapy.  Patient states she recently has had some black drainage and states this is changed to a green drainage at this time.  Assessment & Plan: Visit Diagnoses:  1. 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  Discussed the need to proceed with surgical intervention.  We will plan for debridement with excision of the biofilm and placement of an installation wound VAC.  We will then proceed with additional debridement and at the third surgery plan for thick split-thickness skin graft.  Anticipate patient will need a PICC line for long-term IV antibiotics to decrease the recurrence rate of the biofilm.  Follow-Up Instructions: Return in about 2 weeks (around 10/09/2018).   Ortho Exam  Patient is alert, oriented, no adenopathy, well-dressed, normal affect, normal respiratory effort. Examination patient has green drainage over 75% of the wound.  The wound has stalled in the healing and has epiboly around the wound edges.  There is brawny skin color changes but no cellulitis no purulent drainage.  Patient has no systemic symptoms.  Imaging: No results found.   Labs: Lab Results  Component Value Date   REPTSTATUS 01/04/2017 FINAL 12/30/2016   REPTSTATUS 01/04/2017 FINAL 12/30/2016   GRAMSTAIN   05/17/2013    MODERATE WBC PRESENT, PREDOMINANTLY MONONUCLEAR MODERATE GRAM POSITIVE COCCI IN PAIRS IN CHAINS RARE GRAM NEGATIVE RODS Gram Stain Report Called to,Read Back By and Verified With: HOPPER,M RN @ [phone] 05/17/13 LEONARD,A   GRAMSTAIN  05/17/2013    MODERATE WBC PRESENT, PREDOMINANTLY MONONUCLEAR MODERATE SQUAMOUS EPITHELIAL CELLS PRESENT MODERATE GRAM POSITIVE COCCI IN PAIRS IN CHAINS RARE GRAM NEGATIVE RODS Performed at [facility]   CULT NO GROWTH 5 DAYS 12/30/2016   CULT NO GROWTH 5 DAYS 12/30/2016   LABORGA ESCHERICHIA COLI (A) 12/29/2016     Lab Results  Component Value Date   ALBUMIN 3.2 (L) 01/29/2018   ALBUMIN 2.2 (L) 01/03/2017   ALBUMIN 2.1 (L) 01/02/2017    Body mass index is 53.1 kg/m.  Orders:  No orders of the defined types were placed in this encounter.  No orders of the defined types were placed in this encounter.    Procedures: No procedures performed  Clinical Data: No additional findings.  ROS:  All other systems negative, except as noted in the HPI. Review of Systems  Objective: Vital Signs: Ht 5\' 6"  (1.676 m)   Wt (!) 329 lb (149.2 kg)   BMI 53.10 kg/m   Specialty Comments:  No specialty comments available.  PMFS History: Patient Active Problem List   Diagnosis Date Noted  . Iron deficiency anemia 01/29/2018  . Pulmonary embolus ([place])  01/09/2018  . Encounter for therapeutic drug monitoring 01/09/2018  . Severe malnutrition ([place]) 08/24/2017  . Acute lower UTI 12/29/2016  . Sepsis ([place]) 12/29/2016  . Acute kidney injury superimposed on CKD ([place]) 12/28/2016  . Atypical chest pain 12/28/2016  . Hx of skin graft 08/01/2016  . 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 ([place]) 07/25/2016  . 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 Trichomonal infection 11/24/2015  . Abdominal pain 05/02/2015  . Symptomatic cholelithiasis 04/16/2015  .  Acute bilateral upper abdominal pain 04/16/2015  . Microcytic anemia 05/18/2013  . Hypotension, unspecified 05/17/2013  . CKD (chronic kidney disease), stage III ([place]) 05/17/2013  . Hypokalemia 05/17/2013  . Anal fissure 02/06/2013  . Anal skin tag 02/06/2013  . ALLERGIC RHINITIS 03/05/2010  . LOW BACK PAIN SYNDROME 12/13/2007  . WOUND INFECTION 10/10/2007  . OBESITY, MORBID 12/02/2006  . HTN (hypertension) 12/02/2006  . History of pulmonary embolism 12/02/2006  . History of DVT (deep vein thrombosis) 12/02/2006  . 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 GASTROESOPHAGEAL REFLUX DISEASE 12/02/2006  . OSA (obstructive sleep apnea) 12/02/2006   Past Medical History:  Diagnosis Date  . Anemia   . Anxiety   . Chronic bronchitis ([place])   . Chronic upper back pain   . Depression   . DVT (deep venous thrombosis) (HCC)    BLE  . GERD (gastroesophageal reflux disease)   . Headache    "weekly" (04/16/2015)  . Hypertension   . Migraine    "monthly" (04/16/2015)  . Obesity   . Pneumonia 2014  . Pulmonary embolism ([place])   . Sinusitis nasal   . Sleep apnea    "I'm suppose to wear a mask but I don't" (04/16/2015)  . Varicose veins of right lower extremity   . Venous stasis of lower extremity    right    Family History  Problem Relation Age of Onset  . Kidney disease Mother        kidney transplant    Past Surgical History:  Procedure Laterality Date  . CHOLECYSTECTOMY N/A 04/18/2015   Procedure: LAPAROSCOPIC CHOLECYSTECTOMY;  Surgeon: Ralene Ok, MD;  Location: [facility];  Service: General;  Laterality: N/A;  . HYSTEROSCOPY W/D&C  12/24/2001   Archie Endo 09/28/2010  . I&D EXTREMITY Right 07/25/2016   Procedure: IRRIGATION AND DEBRIDEMENT RIGHT LEG ULCER, APPLY VERAFLO VAC;  Surgeon: [name], MD;  Location: [facility];  Service: Orthopedics;  Laterality: Right;  . INCISE AND DRAIN ABCESS Right 07/14/2016  . INCISION AND DRAINAGE Right 09/10/2008   leg:  skin and soft  tissue and muscle/notes 09/15/2010  . INCISION AND DRAINAGE Right 01/01/2008   Chronic venous stasis insufficiency ulcer,/notes 09/14/2010/  . INCISION AND DRAINAGE Right 09/6810   calf w/application wound vac/notes 07/20/2010  . INCISION AND DRAINAGE Right 07/25/2016   IRRIGATION AND DEBRIDEMENT RIGHT LEG ULCER,  . LAPAROSCOPIC GASTRIC BYPASS  ~ 2007  . SKIN GRAFT SPLIT THICKNESS LEG / FOOT Right 07/25/2016   LEG  . SKIN SPLIT GRAFT Right 07/27/2016   Procedure: SKIN GRAFT RIGHT LEG WITH THERASKIN APPLICATION;  Surgeon: [name], MD;  Location: [facility];  Service: Orthopedics;  Laterality: Right;  . TONSILLECTOMY     Social History   Occupational History  . Not on file  Tobacco Use  . Smoking status: Never Smoker  . Smokeless tobacco: Never Used  Substance and Sexual Activity  . Alcohol use:  No  . Drug use: No  . Sexual activity: Yes    Partners: Male    Birth control/protection: I.U.D.

## 2018-09-27 ENCOUNTER — Telehealth: Payer: Self-pay

## 2018-09-27 ENCOUNTER — Other Ambulatory Visit: Payer: Self-pay

## 2018-09-27 ENCOUNTER — Encounter (HOSPITAL_COMMUNITY): Payer: Self-pay | Admitting: *Deleted

## 2018-09-27 NOTE — Telephone Encounter (Signed)
Pt is scheduled for I&D tomorrow and wants to know if she needs to stop her coumadin or decrease her dose? Pt LM ON VM to call back.

## 2018-09-27 NOTE — Telephone Encounter (Signed)
Pt is aware.  

## 2018-09-27 NOTE — Telephone Encounter (Signed)
Hold coumadin

## 2018-09-27 NOTE — Progress Notes (Signed)
Pt denies SOB, chest pain, and being under the care of a cardiologist. Nurse requested recent stress test, LOV note and EKG from PCP, Dr. Iona Beard, [place]. Pt denies having a cardiac cath. Pt made aware to stop taking vitamins, Phentermine (last dose today, PA, Anesthesiology, aware) fish oil and herbal medications. Nurse spoke with Malachy Mood, Surgical Coordinator, to make her aware that pt was instructed to  call for pre-op Coumadin instructions. and NSAID pre-op instructions.Pt denies having a chest x tray within the last year. Pt denies having recent labs. Pt instructed to quarantine ( had COVID -19 test on 09/26/2018). Pt denies that members of her family tested positive for COVID-19.   Coronavirus Screening  Pt denies that she and family members experienced the following symptoms:  Cough yes/no: No Fever (>100.77F)  yes/no: No Runny nose yes/no: No Sore throat yes/no: No Difficulty breathing/shortness of breath  yes/no: No  Have you or a family member traveled in the last 14 days and where? yes/no: No  Pt reminded  that hospital visitation restrictions are in effect and the importance of the restrictions.   Pt verbalized understanding of all pre-op instructions.

## 2018-09-28 ENCOUNTER — Inpatient Hospital Stay (HOSPITAL_COMMUNITY): Payer: Medicare Other | Admitting: Anesthesiology

## 2018-09-28 ENCOUNTER — Encounter (HOSPITAL_COMMUNITY)
Admission: RE | Disposition: A | Discharge status: Rehabilitation Facility | Payer: Self-pay | Source: Home / Self Care | Attending: Orthopedic Surgery

## 2018-09-28 ENCOUNTER — Inpatient Hospital Stay (HOSPITAL_COMMUNITY)
Admission: RE | Admit: 2018-09-28 | Discharge: 2018-10-05 | DRG: 264 | Disposition: A | Discharge status: Rehabilitation Facility | Payer: Medicare Other | Attending: Orthopedic Surgery | Admitting: Orthopedic Surgery

## 2018-09-28 ENCOUNTER — Encounter (HOSPITAL_COMMUNITY): Payer: Self-pay

## 2018-09-28 ENCOUNTER — Other Ambulatory Visit: Payer: Self-pay

## 2018-09-28 DIAGNOSIS — Z86718 Personal history of other venous thrombosis and embolism: Secondary | ICD-10-CM | POA: Diagnosis not present

## 2018-09-28 DIAGNOSIS — N179 Acute kidney failure, unspecified: Secondary | ICD-10-CM | POA: Diagnosis not present

## 2018-09-28 DIAGNOSIS — L97912 Non-pressure chronic ulcer of unspecified part of right lower leg with fat layer exposed: Secondary | ICD-10-CM | POA: Diagnosis present

## 2018-09-28 DIAGNOSIS — Z841 Family history of disorders of kidney and ureter: Secondary | ICD-10-CM

## 2018-09-28 DIAGNOSIS — L089 Local infection of the skin and subcutaneous tissue, unspecified: Secondary | ICD-10-CM | POA: Diagnosis not present

## 2018-09-28 DIAGNOSIS — I83019 Varicose veins of right lower extremity with ulcer of unspecified site: Secondary | ICD-10-CM | POA: Diagnosis present

## 2018-09-28 DIAGNOSIS — F419 Anxiety disorder, unspecified: Secondary | ICD-10-CM | POA: Diagnosis not present

## 2018-09-28 DIAGNOSIS — Z885 Allergy status to narcotic agent status: Secondary | ICD-10-CM | POA: Diagnosis not present

## 2018-09-28 DIAGNOSIS — B957 Other staphylococcus as the cause of diseases classified elsewhere: Secondary | ICD-10-CM | POA: Diagnosis present

## 2018-09-28 DIAGNOSIS — E8809 Other disorders of plasma-protein metabolism, not elsewhere classified: Secondary | ICD-10-CM | POA: Diagnosis present

## 2018-09-28 DIAGNOSIS — Z1159 Encounter for screening for other viral diseases: Secondary | ICD-10-CM | POA: Diagnosis not present

## 2018-09-28 DIAGNOSIS — R309 Painful micturition, unspecified: Secondary | ICD-10-CM | POA: Diagnosis not present

## 2018-09-28 DIAGNOSIS — L97919 Non-pressure chronic ulcer of unspecified part of right lower leg with unspecified severity: Secondary | ICD-10-CM | POA: Diagnosis not present

## 2018-09-28 DIAGNOSIS — B964 Proteus (mirabilis) (morganii) as the cause of diseases classified elsewhere: Secondary | ICD-10-CM | POA: Diagnosis present

## 2018-09-28 DIAGNOSIS — I129 Hypertensive chronic kidney disease with stage 1 through stage 4 chronic kidney disease, or unspecified chronic kidney disease: Secondary | ICD-10-CM | POA: Diagnosis not present

## 2018-09-28 DIAGNOSIS — R062 Wheezing: Secondary | ICD-10-CM | POA: Diagnosis not present

## 2018-09-28 DIAGNOSIS — F329 Major depressive disorder, single episode, unspecified: Secondary | ICD-10-CM | POA: Diagnosis present

## 2018-09-28 DIAGNOSIS — G8918 Other acute postprocedural pain: Secondary | ICD-10-CM | POA: Diagnosis not present

## 2018-09-28 DIAGNOSIS — I130 Hypertensive heart and chronic kidney disease with heart failure and stage 1 through stage 4 chronic kidney disease, or unspecified chronic kidney disease: Secondary | ICD-10-CM | POA: Diagnosis not present

## 2018-09-28 DIAGNOSIS — Z791 Long term (current) use of non-steroidal anti-inflammatories (NSAID): Secondary | ICD-10-CM | POA: Diagnosis not present

## 2018-09-28 DIAGNOSIS — I10 Essential (primary) hypertension: Secondary | ICD-10-CM | POA: Diagnosis present

## 2018-09-28 DIAGNOSIS — R0989 Other specified symptoms and signs involving the circulatory and respiratory systems: Secondary | ICD-10-CM | POA: Diagnosis not present

## 2018-09-28 DIAGNOSIS — K219 Gastro-esophageal reflux disease without esophagitis: Secondary | ICD-10-CM | POA: Diagnosis present

## 2018-09-28 DIAGNOSIS — Z6841 Body Mass Index (BMI) 40.0 and over, adult: Secondary | ICD-10-CM

## 2018-09-28 DIAGNOSIS — Z9119 Patient's noncompliance with other medical treatment and regimen: Secondary | ICD-10-CM | POA: Diagnosis not present

## 2018-09-28 DIAGNOSIS — S81801S Unspecified open wound, right lower leg, sequela: Secondary | ICD-10-CM | POA: Diagnosis not present

## 2018-09-28 DIAGNOSIS — R791 Abnormal coagulation profile: Secondary | ICD-10-CM | POA: Diagnosis present

## 2018-09-28 DIAGNOSIS — Z945 Skin transplant status: Secondary | ICD-10-CM | POA: Diagnosis not present

## 2018-09-28 DIAGNOSIS — D509 Iron deficiency anemia, unspecified: Secondary | ICD-10-CM | POA: Diagnosis present

## 2018-09-28 DIAGNOSIS — Z86711 Personal history of pulmonary embolism: Secondary | ICD-10-CM

## 2018-09-28 DIAGNOSIS — Z978 Presence of other specified devices: Secondary | ICD-10-CM | POA: Diagnosis not present

## 2018-09-28 DIAGNOSIS — I739 Peripheral vascular disease, unspecified: Secondary | ICD-10-CM | POA: Diagnosis not present

## 2018-09-28 DIAGNOSIS — Z9049 Acquired absence of other specified parts of digestive tract: Secondary | ICD-10-CM

## 2018-09-28 DIAGNOSIS — N182 Chronic kidney disease, stage 2 (mild): Secondary | ICD-10-CM | POA: Diagnosis present

## 2018-09-28 DIAGNOSIS — L97913 Non-pressure chronic ulcer of unspecified part of right lower leg with necrosis of muscle: Secondary | ICD-10-CM | POA: Diagnosis not present

## 2018-09-28 DIAGNOSIS — Z7901 Long term (current) use of anticoagulants: Secondary | ICD-10-CM | POA: Diagnosis not present

## 2018-09-28 DIAGNOSIS — L97213 Non-pressure chronic ulcer of right calf with necrosis of muscle: Secondary | ICD-10-CM | POA: Diagnosis present

## 2018-09-28 DIAGNOSIS — I83212 Varicose veins of right lower extremity with both ulcer of calf and inflammation: Principal | ICD-10-CM | POA: Diagnosis present

## 2018-09-28 DIAGNOSIS — Z79899 Other long term (current) drug therapy: Secondary | ICD-10-CM

## 2018-09-28 DIAGNOSIS — I509 Heart failure, unspecified: Secondary | ICD-10-CM | POA: Diagnosis not present

## 2018-09-28 DIAGNOSIS — G4733 Obstructive sleep apnea (adult) (pediatric): Secondary | ICD-10-CM | POA: Diagnosis not present

## 2018-09-28 DIAGNOSIS — N183 Chronic kidney disease, stage 3 (moderate): Secondary | ICD-10-CM | POA: Diagnosis not present

## 2018-09-28 DIAGNOSIS — R5381 Other malaise: Secondary | ICD-10-CM | POA: Diagnosis not present

## 2018-09-28 DIAGNOSIS — K5900 Constipation, unspecified: Secondary | ICD-10-CM | POA: Diagnosis present

## 2018-09-28 DIAGNOSIS — B9689 Other specified bacterial agents as the cause of diseases classified elsewhere: Secondary | ICD-10-CM | POA: Diagnosis not present

## 2018-09-28 DIAGNOSIS — D62 Acute posthemorrhagic anemia: Secondary | ICD-10-CM | POA: Diagnosis not present

## 2018-09-28 HISTORY — DX: Presence of spectacles and contact lenses: Z97.3

## 2018-09-28 HISTORY — DX: Presence of dental prosthetic device (complete) (partial): Z97.2

## 2018-09-28 HISTORY — DX: Morbid (severe) obesity due to excess calories: E66.01

## 2018-09-28 HISTORY — PX: I&D EXTREMITY: SHX5045

## 2018-09-28 HISTORY — DX: Unspecified osteoarthritis, unspecified site: M19.90

## 2018-09-28 LAB — BASIC METABOLIC PANEL
Anion gap: 10 (ref 5–15)
BUN: 26 mg/dL — ABNORMAL HIGH (ref 6–20)
CO2: 24 mmol/L (ref 22–32)
Calcium: 8.8 mg/dL — ABNORMAL LOW (ref 8.9–10.3)
Chloride: 104 mmol/L (ref 98–111)
Creatinine, Ser: 1.42 mg/dL — ABNORMAL HIGH (ref 0.44–1.00)
GFR calc Af Amer: 51 mL/min — ABNORMAL LOW (ref >60)
GFR calc non Af Amer: 44 mL/min — ABNORMAL LOW (ref >60)
Glucose, Bld: 80 mg/dL (ref 70–99)
Potassium: 3.3 mmol/L — ABNORMAL LOW (ref 3.5–5.1)
Sodium: 138 mmol/L (ref 135–145)

## 2018-09-28 LAB — CBC
HCT: 33.3 % — ABNORMAL LOW (ref 36.0–46.0)
Hemoglobin: 10.6 g/dL — ABNORMAL LOW (ref 12.0–15.0)
MCH: 24.4 pg — ABNORMAL LOW (ref 26.0–34.0)
MCHC: 31.8 g/dL (ref 30.0–36.0)
MCV: 76.6 fL — ABNORMAL LOW (ref 80.0–100.0)
Platelets: 280 10*3/uL (ref 150–400)
RBC: 4.35 MIL/uL (ref 3.87–5.11)
RDW: 17 % — ABNORMAL HIGH (ref 11.5–15.5)
WBC: 5.6 10*3/uL (ref 4.0–10.5)
nRBC: 0 % (ref 0.0–0.2)

## 2018-09-28 LAB — PROTIME-INR
INR: 2.7 — ABNORMAL HIGH (ref 0.8–1.2)
Prothrombin Time: 28.5 seconds — ABNORMAL HIGH (ref 11.4–15.2)

## 2018-09-28 LAB — GLUCOSE, CAPILLARY: Glucose-Capillary: 128 mg/dL — ABNORMAL HIGH (ref 70–99)

## 2018-09-28 LAB — POCT PREGNANCY, URINE: Preg Test, Ur: NEGATIVE

## 2018-09-28 SURGERY — IRRIGATION AND DEBRIDEMENT EXTREMITY
Anesthesia: General | Site: Leg Lower | Laterality: Right

## 2018-09-28 MED ORDER — OXYCODONE HCL 5 MG PO TABS
5.0000 mg | ORAL_TABLET | ORAL | Status: DC | PRN
  Administered 2018-10-03: 18:00:00 10 mg via ORAL
  Filled 2018-09-28: qty 2
  Filled 2018-09-28: qty 1
  Filled 2018-09-28: qty 2

## 2018-09-28 MED ORDER — DEXAMETHASONE SODIUM PHOSPHATE 4 MG/ML IJ SOLN
INTRAMUSCULAR | Status: DC | PRN
  Administered 2018-09-28: 10 mg via INTRAVENOUS

## 2018-09-28 MED ORDER — CHLORHEXIDINE GLUCONATE 4 % EX LIQD: 60.0000 mL | Freq: Once | CUTANEOUS | Status: DC

## 2018-09-28 MED ORDER — TRANEXAMIC ACID-NACL 1000-0.7 MG/100ML-% IV SOLN
INTRAVENOUS | Status: AC
  Filled 2018-09-28: qty 100

## 2018-09-28 MED ORDER — DEXTROSE 5 % IV SOLN
3.0000 g | INTRAVENOUS | Status: AC
  Administered 2018-09-28: 3 g via INTRAVENOUS
  Filled 2018-09-28: qty 3000

## 2018-09-28 MED ORDER — TRANEXAMIC ACID-NACL 1000-0.7 MG/100ML-% IV SOLN: 1000.0000 mg | Freq: Once | INTRAVENOUS | Status: DC

## 2018-09-28 MED ORDER — TRANEXAMIC ACID 1000 MG/10ML IV SOLN
2000.0000 mg | Freq: Once | INTRAVENOUS | Status: DC
  Filled 2018-09-28 (×2): qty 20

## 2018-09-28 MED ORDER — FENTANYL CITRATE (PF) 100 MCG/2ML IJ SOLN
INTRAMUSCULAR | Status: AC
  Filled 2018-09-28: qty 2

## 2018-09-28 MED ORDER — HYDROCHLOROTHIAZIDE 25 MG PO TABS
25.0000 mg | ORAL_TABLET | Freq: Every day | ORAL | Status: DC
  Administered 2018-09-28 – 2018-10-05 (×7): 25 mg via ORAL
  Filled 2018-09-28 (×8): qty 1

## 2018-09-28 MED ORDER — CEFAZOLIN SODIUM-DEXTROSE 1-4 GM/50ML-% IV SOLN
1.0000 g | Freq: Three times a day (TID) | INTRAVENOUS | Status: DC
  Administered 2018-09-28 – 2018-10-01 (×9): 1 g via INTRAVENOUS
  Filled 2018-09-28 (×11): qty 50

## 2018-09-28 MED ORDER — HYDROMORPHONE HCL 1 MG/ML IJ SOLN
0.5000 mg | INTRAMUSCULAR | Status: DC | PRN
  Administered 2018-09-28 – 2018-10-03 (×13): 1 mg via INTRAVENOUS
  Filled 2018-09-28 (×14): qty 1

## 2018-09-28 MED ORDER — SCOPOLAMINE 1 MG/3DAYS TD PT72
1.0000 | MEDICATED_PATCH | TRANSDERMAL | Status: DC
  Administered 2018-09-28: 1.5 mg via TRANSDERMAL
  Filled 2018-09-28: qty 1

## 2018-09-28 MED ORDER — PREGABALIN 100 MG PO CAPS
200.0000 mg | ORAL_CAPSULE | Freq: Two times a day (BID) | ORAL | Status: DC
  Administered 2018-09-28 – 2018-10-05 (×13): 200 mg via ORAL
  Filled 2018-09-28 (×13): qty 2

## 2018-09-28 MED ORDER — FENTANYL CITRATE (PF) 100 MCG/2ML IJ SOLN
25.0000 ug | INTRAMUSCULAR | Status: DC | PRN
  Administered 2018-09-28 (×3): 50 ug via INTRAVENOUS

## 2018-09-28 MED ORDER — AMLODIPINE BESYLATE 10 MG PO TABS
10.0000 mg | ORAL_TABLET | Freq: Every day | ORAL | Status: DC
  Administered 2018-09-29 – 2018-10-05 (×5): 10 mg via ORAL
  Filled 2018-09-28 (×7): qty 1

## 2018-09-28 MED ORDER — METOCLOPRAMIDE HCL 5 MG/ML IJ SOLN: 5.0000 mg | Freq: Three times a day (TID) | INTRAMUSCULAR | Status: DC | PRN

## 2018-09-28 MED ORDER — ONDANSETRON HCL 4 MG/2ML IJ SOLN: 4.0000 mg | Freq: Four times a day (QID) | INTRAMUSCULAR | Status: DC | PRN

## 2018-09-28 MED ORDER — METOCLOPRAMIDE HCL 5 MG PO TABS: 5.0000 mg | ORAL_TABLET | Freq: Three times a day (TID) | ORAL | Status: DC | PRN

## 2018-09-28 MED ORDER — ACETAMINOPHEN 325 MG PO TABS
325.0000 mg | ORAL_TABLET | Freq: Four times a day (QID) | ORAL | Status: DC | PRN
  Administered 2018-10-02: 650 mg via ORAL
  Filled 2018-09-28: qty 2

## 2018-09-28 MED ORDER — HYDROMORPHONE HCL 1 MG/ML IJ SOLN
INTRAMUSCULAR | Status: AC
  Filled 2018-09-28: qty 1

## 2018-09-28 MED ORDER — 0.9 % SODIUM CHLORIDE (POUR BTL) OPTIME
TOPICAL | Status: DC | PRN
  Administered 2018-09-28: 1000 mL

## 2018-09-28 MED ORDER — ACETAMINOPHEN 500 MG PO TABS
1000.0000 mg | ORAL_TABLET | Freq: Four times a day (QID) | ORAL | Status: AC
  Administered 2018-09-28 – 2018-09-29 (×3): 1000 mg via ORAL
  Filled 2018-09-28 (×4): qty 2

## 2018-09-28 MED ORDER — BUPROPION HCL ER (XL) 150 MG PO TB24
300.0000 mg | ORAL_TABLET | Freq: Every day | ORAL | Status: DC
  Administered 2018-09-29 – 2018-10-05 (×6): 300 mg via ORAL
  Filled 2018-09-28 (×6): qty 2

## 2018-09-28 MED ORDER — ALBUTEROL SULFATE (2.5 MG/3ML) 0.083% IN NEBU: 3.0000 mL | INHALATION_SOLUTION | Freq: Four times a day (QID) | RESPIRATORY_TRACT | Status: DC | PRN

## 2018-09-28 MED ORDER — TRANEXAMIC ACID-NACL 1000-0.7 MG/100ML-% IV SOLN
INTRAVENOUS | Status: DC | PRN
  Administered 2018-09-28: 1000 mg via INTRAVENOUS

## 2018-09-28 MED ORDER — MIDAZOLAM HCL 2 MG/2ML IJ SOLN
INTRAMUSCULAR | Status: AC
  Filled 2018-09-28: qty 2

## 2018-09-28 MED ORDER — ONDANSETRON HCL 4 MG PO TABS
4.0000 mg | ORAL_TABLET | Freq: Four times a day (QID) | ORAL | Status: DC | PRN
  Filled 2018-09-28: qty 1

## 2018-09-28 MED ORDER — TRANEXAMIC ACID-NACL 1000-0.7 MG/100ML-% IV SOLN
1000.0000 mg | Freq: Once | INTRAVENOUS | Status: AC
  Administered 2018-09-28: 17:00:00 1000 mg via INTRAVENOUS
  Filled 2018-09-28 (×2): qty 100

## 2018-09-28 MED ORDER — TIZANIDINE HCL 2 MG PO TABS
4.0000 mg | ORAL_TABLET | Freq: Three times a day (TID) | ORAL | Status: DC | PRN
  Administered 2018-09-30: 4 mg via ORAL
  Administered 2018-10-01: 8 mg via ORAL
  Administered 2018-10-02: 4 mg via ORAL
  Administered 2018-10-02: 8 mg via ORAL
  Administered 2018-10-03: 4 mg via ORAL
  Filled 2018-09-28: qty 4
  Filled 2018-09-28 (×2): qty 2
  Filled 2018-09-28: qty 4
  Filled 2018-09-28: qty 2

## 2018-09-28 MED ORDER — DOCUSATE SODIUM 100 MG PO CAPS
100.0000 mg | ORAL_CAPSULE | Freq: Two times a day (BID) | ORAL | Status: DC
  Administered 2018-09-28 – 2018-10-05 (×14): 100 mg via ORAL
  Filled 2018-09-28 (×14): qty 1

## 2018-09-28 MED ORDER — HYDROMORPHONE HCL 1 MG/ML IJ SOLN
0.2500 mg | INTRAMUSCULAR | Status: DC | PRN
  Administered 2018-09-28 (×4): 0.5 mg via INTRAVENOUS

## 2018-09-28 MED ORDER — ONDANSETRON HCL 4 MG/2ML IJ SOLN
INTRAMUSCULAR | Status: DC | PRN
  Administered 2018-09-28: 4 mg via INTRAVENOUS

## 2018-09-28 MED ORDER — FENTANYL CITRATE (PF) 100 MCG/2ML IJ SOLN
INTRAMUSCULAR | Status: DC | PRN
  Administered 2018-09-28 (×3): 50 ug via INTRAVENOUS

## 2018-09-28 MED ORDER — PROPOFOL 10 MG/ML IV BOLUS
INTRAVENOUS | Status: DC | PRN
  Administered 2018-09-28: 160 mg via INTRAVENOUS

## 2018-09-28 MED ORDER — ADULT MULTIVITAMIN W/MINERALS CH
1.0000 | ORAL_TABLET | Freq: Every day | ORAL | Status: DC
  Administered 2018-09-28 – 2018-10-05 (×7): 1 via ORAL
  Filled 2018-09-28 (×7): qty 1

## 2018-09-28 MED ORDER — PROPOFOL 10 MG/ML IV BOLUS
INTRAVENOUS | Status: AC
  Filled 2018-09-28: qty 40

## 2018-09-28 MED ORDER — LIDOCAINE 2% (20 MG/ML) 5 ML SYRINGE
INTRAMUSCULAR | Status: DC | PRN
  Administered 2018-09-28: 80 mg via INTRAVENOUS

## 2018-09-28 MED ORDER — ONDANSETRON HCL 4 MG/2ML IJ SOLN: 4.0000 mg | Freq: Once | INTRAMUSCULAR | Status: DC | PRN

## 2018-09-28 MED ORDER — OXYCODONE HCL 5 MG PO TABS
10.0000 mg | ORAL_TABLET | ORAL | Status: DC | PRN
  Administered 2018-09-29 (×2): 10 mg via ORAL
  Administered 2018-09-29 – 2018-10-05 (×19): 15 mg via ORAL
  Filled 2018-09-28 (×10): qty 3
  Filled 2018-09-28: qty 2
  Filled 2018-09-28 (×11): qty 3
  Filled 2018-09-28: qty 2

## 2018-09-28 MED ORDER — FENTANYL CITRATE (PF) 250 MCG/5ML IJ SOLN
INTRAMUSCULAR | Status: AC
  Filled 2018-09-28: qty 5

## 2018-09-28 MED ORDER — MIDAZOLAM HCL 5 MG/5ML IJ SOLN
INTRAMUSCULAR | Status: DC | PRN
  Administered 2018-09-28: 2 mg via INTRAVENOUS

## 2018-09-28 MED ORDER — PHENTERMINE HCL 37.5 MG PO TABS: 37.5000 mg | ORAL_TABLET | Freq: Every day | ORAL | Status: DC

## 2018-09-28 MED ORDER — PANTOPRAZOLE SODIUM 40 MG PO TBEC
40.0000 mg | DELAYED_RELEASE_TABLET | Freq: Every day | ORAL | Status: DC
  Administered 2018-09-29 – 2018-10-05 (×6): 40 mg via ORAL
  Filled 2018-09-28 (×6): qty 1

## 2018-09-28 MED ORDER — LACTATED RINGERS IV SOLN
INTRAVENOUS | Status: DC
  Administered 2018-09-28: 07:00:00 via INTRAVENOUS

## 2018-09-28 SURGICAL SUPPLY — 35 items
BLADE SURG 21 STRL SS (BLADE) ×2 IMPLANT
BNDG COHESIVE 6X5 TAN STRL LF (GAUZE/BANDAGES/DRESSINGS) ×1 IMPLANT
BNDG GAUZE ELAST 4 BULKY (GAUZE/BANDAGES/DRESSINGS) ×4 IMPLANT
CANISTER WOUND CARE 500ML ATS (WOUND CARE) ×1 IMPLANT
CASSETTE VERAFLO VERALINK (MISCELLANEOUS) ×1 IMPLANT
COVER SURGICAL LIGHT HANDLE (MISCELLANEOUS) ×4 IMPLANT
COVER WAND RF STERILE (DRAPES) ×2 IMPLANT
DRAPE U-SHAPE 47X51 STRL (DRAPES) ×2 IMPLANT
DRESSING HYDROCOLLOID 4X4 (GAUZE/BANDAGES/DRESSINGS) ×3 IMPLANT
DRESSING VERAFLO CLEANSE CC (GAUZE/BANDAGES/DRESSINGS) IMPLANT
DRSG ADAPTIC 3X8 NADH LF (GAUZE/BANDAGES/DRESSINGS) ×2 IMPLANT
DRSG VERAFLO CLEANSE CC (GAUZE/BANDAGES/DRESSINGS) ×2
DURAPREP 26ML APPLICATOR (WOUND CARE) ×2 IMPLANT
ELECT REM PT RETURN 9FT ADLT (ELECTROSURGICAL)
ELECTRODE REM PT RTRN 9FT ADLT (ELECTROSURGICAL) IMPLANT
GAUZE SPONGE 4X4 12PLY STRL (GAUZE/BANDAGES/DRESSINGS) ×2 IMPLANT
GLOVE BIOGEL PI IND STRL 9 (GLOVE) ×1 IMPLANT
GLOVE BIOGEL PI INDICATOR 9 (GLOVE) ×1
GLOVE SURG ORTHO 9.0 STRL STRW (GLOVE) ×2 IMPLANT
GOWN STRL REUS W/ TWL XL LVL3 (GOWN DISPOSABLE) ×2 IMPLANT
GOWN STRL REUS W/TWL XL LVL3 (GOWN DISPOSABLE) ×4
HANDPIECE INTERPULSE COAX TIP (DISPOSABLE)
KIT BASIN OR (CUSTOM PROCEDURE TRAY) ×2 IMPLANT
KIT TURNOVER KIT B (KITS) ×2 IMPLANT
MANIFOLD NEPTUNE II (INSTRUMENTS) ×2 IMPLANT
NS IRRIG 1000ML POUR BTL (IV SOLUTION) ×2 IMPLANT
PACK ORTHO EXTREMITY (CUSTOM PROCEDURE TRAY) ×2 IMPLANT
PAD ARMBOARD 7.5X6 YLW CONV (MISCELLANEOUS) ×4 IMPLANT
SET HNDPC FAN SPRY TIP SCT (DISPOSABLE) IMPLANT
STOCKINETTE IMPERVIOUS 9X36 MD (GAUZE/BANDAGES/DRESSINGS) IMPLANT
SWAB COLLECTION DEVICE MRSA (MISCELLANEOUS) ×2 IMPLANT
SWAB CULTURE ESWAB REG 1ML (MISCELLANEOUS) IMPLANT
TOWEL OR 17X26 10 PK STRL BLUE (TOWEL DISPOSABLE) ×2 IMPLANT
TUBE CONNECTING 12X1/4 (SUCTIONS) ×2 IMPLANT
YANKAUER SUCT BULB TIP NO VENT (SUCTIONS) ×2 IMPLANT

## 2018-09-28 NOTE — Anesthesia Preprocedure Evaluation (Addendum)
Anesthesia Evaluation  Patient identified by MRN, date of birth, ID band Patient awake    Reviewed: Allergy & Precautions, NPO status , Patient's Chart, lab work & pertinent test results  Airway Mallampati: III  TM Distance: >3 FB Neck ROM: Full    Dental  (+) Partial Upper, Dental Advisory Given   Pulmonary sleep apnea and Continuous Positive Airway Pressure Ventilation , pneumonia, resolved,    Pulmonary exam normal breath sounds clear to auscultation       Cardiovascular hypertension, Pt. on medications + Peripheral Vascular Disease, +CHF and + DVT  Normal cardiovascular exam Rhythm:Regular Rate:Normal  LVEF 50-55% Hx/o DVT and PTE   Neuro/Psych  Headaches, PSYCHIATRIC DISORDERS Anxiety Depression  Neuromuscular disease    GI/Hepatic Neg liver ROS, GERD  Medicated and Controlled,  Endo/Other  Morbid obesity  Renal/GU Renal InsufficiencyRenal disease  negative genitourinary   Musculoskeletal  (+) Arthritis , Osteoarthritis,  Chronic ulcer right lower leg   Abdominal (+) + obese,   Peds  Hematology  (+) anemia , Anticoagulated on Coumadin - last dose   Anesthesia Other Findings   Reproductive/Obstetrics                            Anesthesia Physical Anesthesia Plan  ASA: III  Anesthesia Plan: General   Post-op Pain Management:    Induction: Intravenous  PONV Risk Score and Plan: 4 or greater and Midazolam, Ondansetron, Treatment may vary due to age or medical condition, Dexamethasone and Scopolamine patch - Pre-op  Airway Management Planned: LMA  Additional Equipment:   Intra-op Plan:   Post-operative Plan: Extubation in OR  Informed Consent: I have reviewed the patients History and Physical, chart, labs and discussed the procedure including the risks, benefits and alternatives for the proposed anesthesia with the patient or authorized representative who has indicated his/her  understanding and acceptance.     Dental advisory given  Plan Discussed with: CRNA and Surgeon  Anesthesia Plan Comments:        Anesthesia Quick Evaluation

## 2018-09-28 NOTE — Anesthesia Procedure Notes (Signed)
Procedure Name: LMA Insertion Date/Time: 09/28/2018 7:40 AM Performed by: Candis Shine, CRNA Pre-anesthesia Checklist: Patient identified, Emergency Drugs available, Suction available and Patient being monitored Patient Re-evaluated:Patient Re-evaluated prior to induction Oxygen Delivery Method: Circle System Utilized Preoxygenation: Pre-oxygenation with 100% oxygen Induction Type: IV induction Ventilation: Mask ventilation without difficulty LMA: LMA inserted LMA Size: 4.0 Number of attempts: 1 Placement Confirmation: positive ETCO2 Tube secured with: Tape Dental Injury: Teeth and Oropharynx as per pre-operative assessment

## 2018-09-28 NOTE — Progress Notes (Addendum)
Patient is stable and patient's wound vac and dressing were changed. No is no more bleeding a clot was removed during dressing change. Dressing is clean dry and intact. Prior to transport dressing is still clean dry and intact. Sharol Given stated to send to the floor.

## 2018-09-28 NOTE — Progress Notes (Signed)
Sharol Given, MD to bedside. Dressing and wound vac and changed. Will continue to monitor.

## 2018-09-28 NOTE — Op Note (Signed)
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  SURGEON:  [name], MD  PHYSICIAN ASSISTANT:None ANESTHESIA:   General  PREOPERATIVE INDICATIONS:  Alice [name] is a  48 y.o. female with a diagnosis of Chronic Ulcer Right Lower Leg who failed conservative measures and elected for surgical management.    The risks benefits and alternatives were discussed with the patient preoperatively including but not limited to the risks of infection, bleeding, nerve injury, cardiopulmonary complications, the need for revision surgery, among others, and the patient was willing to proceed.  OPERATIVE IMPLANTS: Cleanse choice wound VAC sponge topical treatment with TXA  @ENCIMAGES @  OPERATIVE FINDINGS: Green-black necrotic biofilm excised and sent for tissue cultures.  OPERATIVE PROCEDURE: Patient was brought the operating room and underwent a LMA general anesthetic.  After adequate levels anesthesia were obtained patient's right lower extremity was prepped using DuraPrep draped into a sterile field a timeout was called.  A 21 blade knife was used to sharply excise the black-green necrotic tissue which included skin and soft tissue muscle and fascia.  There was good petechial bleeding and the wound was soaked with TXA on a sponge.  The wound was 15 cm x 15 cm and half a centimeter deep.  The wound was irrigated with normal saline hemostasis was obtained with the TXA.  The periwound area was prepped using benzoin this was then covered with DuoDERM.  The cleanse choice sponges were applied first the reticulated foam followed by the thick solid foam.  This was secured with Ioban.  This was connected to the wound VAC pump this was set for 125 mm of  suction with 20 cc of irrigation dwell time 10 minutes suction time 2 hours with normal saline.  This was secured with Coban wrap.  Patient was extubated taken the PACU in stable condition.   DISCHARGE PLANNING:  Antibiotic duration: Continue IV antibiotics and adjust according to tissue cultures  Weightbearing: Weightbearing as tolerated  Pain medication: High-dose opioid pathway  Dressing care/ Wound VAC: Wound VAC with instillation 20 cc for 10 minutes.  Ambulatory devices: Walker or crutches  Discharge to: Plan for return to the operating room on Wednesday for repeat irrigation and debridement reapplication of the cleanse choice dressing with split-thickness skin graft next Friday  Follow-up: In the office 1 week post operative.

## 2018-09-28 NOTE — Progress Notes (Signed)
Notified OR a total of three times today, first call they came and changed they dressing due to a leak and blockage. It worked temporarily then notified OR again due to blockage from bubbles in canister. Changed canister and it began to flow properly. Notified OR again of an alarm of blockage again. OR states they will come back to take a look. I will continue to monitor the wound vac and patient.

## 2018-09-28 NOTE — Progress Notes (Signed)
Sharol Given, MD to bedside. Dressing and wound vac changed. Will continue to monitor.

## 2018-09-28 NOTE — Progress Notes (Signed)
Patient ID: Alice [name], female   DOB: 27-Sep-1970, 48 y.o.   MRN: [id number] Patient was seen at bedside postoperatively with new bleeding.  The dressing was removed.  The bleeding occurred at the areas of shear from the reticulated wound VAC sponge.  This bleeding stopped immediately after topical pressure.  The flat cleanse choice sponge was applied.  New dressing was applied this had one check with good suction fit.  There was no drainage in the tube.  No active bleeding.  Orders written to hold her Coumadin and to have 1 dose of 1000 mg TXA.  Anticipate leaving this wound VAC in place until surgery on Wednesday.

## 2018-09-28 NOTE — Progress Notes (Signed)
Sharol Given MD and OR nurse came to change dressing and wound vac.

## 2018-09-28 NOTE — Progress Notes (Signed)
[place] for Warfarin Indication: DVT, chronic Warfarin PTA  Allergies  Allergen Reactions  . Oxycodone Hcl Nausea And Vomiting  . Vicodin [Hydrocodone-Acetaminophen] Nausea And Vomiting    Patient Measurements: Height: 5\' 6"  (167.6 cm) Weight: (!) 329 lb (149.2 kg) IBW/kg (Calculated) : 59.3  Vital Signs: Temp: 97.6 F (36.4 C) (05/15 1100) Temp Source: Oral (05/15 0619) BP: 119/97 (05/15 1228) Pulse Rate: 88 (05/15 1228)  Labs: Recent Labs    09/28/18 0559  HGB 10.6*  HCT 33.3*  PLT 280  LABPROT 28.5*  INR 2.7*  CREATININE 1.42*   Estimated Creatinine Clearance: 74.5 mL/min (A) (by C-G formula based on SCr of 1.42 mg/dL (H)).  Medical History: Past Medical History:  Diagnosis Date  . Anemia   . Anxiety   . Arthritis   . Chronic bronchitis ([place])   . Chronic upper back pain   . Depression   . DVT (deep venous thrombosis) (H[facility])    BLE  . GERD (gastroesophageal reflux disease)   . Headache    "weekly" (04/16/2015)  . Hypertension   . Migraine    "monthly" (04/16/2015)  . Morbid obesity with BMI of 50.0-59.9, adult ([place])   . Obesity   . Pneumonia 2014  . Pulmonary embolism ([place])   . Sinusitis nasal   . Sleep apnea    "I'm suppose to wear a mask but I don't" (04/16/2015)  . Varicose veins of right lower extremity   . Venous stasis of lower extremity    right  . Wears dentures   . Wears glasses    Medications:  Scheduled:  . acetaminophen  1,000 mg Oral Q6H  . [START ON 09/29/2018] amLODipine  10 mg Oral Daily  . [START ON 09/29/2018] buPROPion  300 mg Oral Daily  . docusate sodium  100 mg Oral BID  . fentaNYL      . fentaNYL      . hydrochlorothiazide  25 mg Oral Daily  . HYDROmorphone      . HYDROmorphone      . multivitamin with minerals  1 tablet Oral Daily  . [START ON 09/29/2018] pantoprazole  40 mg Oral Daily  . pregabalin  200 mg Oral BID  . tranexamic acid (CYKLOKAPRON) topical -INTRAOP  2,000 mg  Topical Once   Assessment: 36 yoF with chronic ulceration RLE, surgical intervention, abx started 5/15, VAC placed. Continue Warfarin for hx DVT. Home dose 7.5mg  daily exc 10mg  Wed,Sun  Today, 09/28/2018 5/15 INR 2.7, Last dose Warfarin unknown, Med hx 5/13 via phone call pre-operatively Hgb 10.6, Plt wnl  Goal of Therapy:  Ortho requested lower INR 1.5-2 post-procedure  Monitor platelets by anticoagulation protocol: Yes   Plan:  No Warfarin today Daily Protime/INR  Minda Ditto PharmD [phone] till 3:30pm 09/28/2018,12:33 PM

## 2018-09-28 NOTE — H&P (Signed)
Alice [name] is an 48 y.o. female.   Chief Complaint: Chronic ulceration right lower extremity HPI: The patient is a 48 year old woman who has had a chronic ulceration over her right lower extremity ongoing for more than a year.  Multiple modalities and treatments have been attempted for healing of her chronic venous insufficiency ulcer but have been unsuccessful.  The patient has now developed significant biofilm and increased epiboly around the wound edges and presents for surgical intervention with plans for debridement, excision of biofilm, placement of installation wound VAC.  Plan staged procedures with possible split thickness skin graft once the wound is sufficiently clean for placement of a graft.  Past Medical History:  Diagnosis Date  . Anemia   . Anxiety   . Arthritis   . Chronic bronchitis ([place])   . Chronic upper back pain   . Depression   . DVT (deep venous thrombosis) (HCC)    BLE  . GERD (gastroesophageal reflux disease)   . Headache    "weekly" (04/16/2015)  . Hypertension   . Migraine    "monthly" (04/16/2015)  . Morbid obesity with BMI of 50.0-59.9, adult ([place])   . Obesity   . Pneumonia 2014  . Pulmonary embolism ([place])   . Sinusitis nasal   . Sleep apnea    "I'm suppose to wear a mask but I don't" (04/16/2015)  . Varicose veins of right lower extremity   . Venous stasis of lower extremity    right  . Wears dentures   . Wears glasses     Past Surgical History:  Procedure Laterality Date  . CHOLECYSTECTOMY N/A 04/18/2015   Procedure: LAPAROSCOPIC CHOLECYSTECTOMY;  Surgeon: Ralene Ok, MD;  Location: [facility];  Service: General;  Laterality: N/A;  . HYSTEROSCOPY W/D&C  12/24/2001   Archie Endo 09/28/2010  . I&D EXTREMITY Right 07/25/2016   Procedure: IRRIGATION AND DEBRIDEMENT RIGHT LEG ULCER, APPLY VERAFLO VAC;  Surgeon: [name], MD;  Location: [facility];  Service: Orthopedics;  Laterality: Right;  . INCISE AND DRAIN ABCESS Right 07/14/2016  . INCISION AND DRAINAGE  Right 09/10/2008   leg:  skin and soft tissue and muscle/notes 09/15/2010  . INCISION AND DRAINAGE Right 01/01/2008   Chronic venous stasis insufficiency ulcer,/notes 09/14/2010/  . INCISION AND DRAINAGE Right 09/8848   calf w/application wound vac/notes 07/20/2010  . INCISION AND DRAINAGE Right 07/25/2016   IRRIGATION AND DEBRIDEMENT RIGHT LEG ULCER,  . LAPAROSCOPIC GASTRIC BYPASS  ~ 2007  . MULTIPLE TOOTH EXTRACTIONS    . SKIN GRAFT SPLIT THICKNESS LEG / FOOT Right 07/25/2016   LEG  . SKIN SPLIT GRAFT Right 07/27/2016   Procedure: SKIN GRAFT RIGHT LEG WITH THERASKIN APPLICATION;  Surgeon: [name], MD;  Location: [facility];  Service: Orthopedics;  Laterality: Right;  . TONSILLECTOMY      Family History  Problem Relation Age of Onset  . Kidney disease Mother        kidney transplant   Social History:  reports that she has never smoked. She has never used smokeless tobacco. She reports that she does not drink alcohol or use drugs.  Allergies:  Allergies  Allergen Reactions  . Oxycodone Hcl Nausea And Vomiting  . Vicodin [Hydrocodone-Acetaminophen] Nausea And Vomiting    Medications Prior to Admission  Medication Sig Dispense Refill  . albuterol (PROVENTIL HFA;VENTOLIN HFA) 108 (90 Base) MCG/ACT inhaler Inhale 2 puffs into the lungs every 6 (six) hours as needed for wheezing or shortness of breath.     Marland Kitchen  amLODipine (NORVASC) 10 MG tablet Take 1 tablet (10 mg total) by mouth daily. 31 tablet 0  . buPROPion (WELLBUTRIN XL) 300 MG 24 hr tablet Take 300 mg by mouth daily.    . hydrochlorothiazide (HYDRODIURIL) 25 MG tablet Take 25 mg by mouth daily.    . Multiple Vitamin (MULTIVITAMIN WITH MINERALS) TABS tablet Take 1 tablet by mouth daily.    . naproxen sodium (ALEVE) 220 MG tablet Take 660 mg by mouth 2 (two) times daily as needed (pain).    . pantoprazole (PROTONIX) 40 MG tablet Take 1 tablet (40 mg total) by mouth daily. For acid reflux 30 tablet 0  . phentermine (ADIPEX-P) 37.5 MG  tablet Take 37.5 mg by mouth daily.    . pregabalin (LYRICA) 200 MG capsule Take 200 mg by mouth 2 (two) times a day.    Marland Kitchen tiZANidine (ZANAFLEX) 4 MG tablet Take 4-8 mg by mouth every 8 (eight) hours as needed for muscle spasms (pain).    Marland Kitchen warfarin (COUMADIN) 2.5 MG tablet TAKE 3 TABLETS BY MOUTH DAILY OR AS DIRECTED BY ANTICOAGULATION CLINIC (Patient taking differently: Take 7.5-10 mg by mouth See admin instructions. Take 7.5 mg in the morning on Mon, Tue, Thur, Fri, and Sat. Take 10 mg in the morning and Wed and Sun.) 290 tablet 1  . DULoxetine (CYMBALTA) 30 MG capsule Take 1 capsule (30 mg total) by mouth daily. (Patient not taking: Reported on 09/26/2018) 30 capsule 3  . nitroGLYCERIN (NITROSTAT) 0.4 MG SL tablet Place 1 tablet (0.4 mg total) under the tongue every 5 (five) minutes x 3 doses as needed for chest pain. (Patient not taking: Reported on 09/26/2018) 30 tablet 4  . pentoxifylline (TRENTAL) 400 MG CR tablet TAKE 1 TABLET(400 MG) BY MOUTH THREE TIMES DAILY WITH MEALS (Patient not taking: Reported on 09/26/2018) 270 tablet 1  . traMADol (ULTRAM) 50 MG tablet Take 1 tablet (50 mg total) by mouth every 6 (six) hours as needed for moderate pain. (Patient not taking: Reported on 09/26/2018) 30 tablet 0  . traMADol (ULTRAM) 50 MG tablet Take 1 tablet (50 mg total) by mouth every 6 (six) hours as needed for moderate pain. (Patient not taking: Reported on 09/26/2018) 30 tablet 0    Results for orders placed or performed during the hospital encounter of 09/28/18 (from the past 48 hour(s))  Basic metabolic panel     Status: Abnormal   Collection Time: 09/28/18  5:59 AM  Result Value Ref Range   Sodium 138 135 - 145 mmol/L   Potassium 3.3 (L) 3.5 - 5.1 mmol/L   Chloride 104 98 - 111 mmol/L   CO2 24 22 - 32 mmol/L   Glucose, Bld 80 70 - 99 mg/dL   BUN 26 (H) 6 - 20 mg/dL   Creatinine, Ser 1.42 (H) 0.44 - 1.00 mg/dL   Calcium 8.8 (L) 8.9 - 10.3 mg/dL   GFR calc non Af Amer 44 (L) >60 mL/min   GFR  calc Af Amer 51 (L) >60 mL/min   Anion gap 10 5 - 15    Comment: Performed at [facility], [address] [place], [place] [address]  CBC     Status: Abnormal   Collection Time: 09/28/18  5:59 AM  Result Value Ref Range   WBC 5.6 4.0 - 10.5 K/uL   RBC 4.35 3.87 - 5.11 MIL/uL   Hemoglobin 10.6 (L) 12.0 - 15.0 g/dL   HCT 33.3 (L) 36.0 - 46.0 %  MCV 76.6 (L) 80.0 - 100.0 fL   MCH 24.4 (L) 26.0 - 34.0 pg   MCHC 31.8 30.0 - 36.0 g/dL   RDW 17.0 (H) 11.5 - 15.5 %   Platelets 280 150 - 400 K/uL   nRBC 0.0 0.0 - 0.2 %    Comment: Performed at [facility] [address], [place], [place] [address]  Protime-INR     Status: Abnormal   Collection Time: 09/28/18  5:59 AM  Result Value Ref Range   Prothrombin Time 28.5 (H) 11.4 - 15.2 seconds   INR 2.7 (H) 0.8 - 1.2    Comment: (NOTE) INR goal varies based on device and disease states. Performed at [facility], [place] [address], [place], [place] [address]    No results found.  Review of Systems  All other systems reviewed and are negative.   Blood pressure 140/78, pulse 78, temperature 98 F (36.7 C), temperature source Oral, resp. rate 18, height 5\' 6"  (1.676 m), weight (!) 149.2 kg, SpO2 100 %. Physical Exam  Constitutional: She is oriented to person, place, and time. She appears well-developed and well-nourished.  HENT:  Head: Normocephalic and atraumatic.  Neck: No tracheal deviation present. No thyromegaly present.  Cardiovascular: Normal rate and regular rhythm.  Respiratory: Effort normal. No stridor. No respiratory distress.  GI: Soft. She exhibits no distension.  Musculoskeletal:     Comments: Examination patient has green drainage over 75% of the wound.  The wound has stalled in the healing and has epiboly around the wound edges.  There is brawny skin color changes but no cellulitis no purulent drainage.   Neurological: She is alert and oriented to person, place, and time. No cranial nerve deficit. She  exhibits normal muscle tone. Coordination normal.  Psychiatric: She has a normal mood and affect. Her behavior is normal. Judgment and thought content normal.     Assessment/Plan Chronic ulceration of the right lower extremity due to chronic venous insufficiency-planned staged procedures with initial debridement and excision of biofilm and placement of installation wound VAC, followed by additional debridement and at the third surgery plan for split thickness skin graft.  She may require a PICC line for long-term IV antibiotics as well.  The procedure and benefits and risks were discussed with the patient including the risk of bleeding, infection, neurovascular injury, possible need for further surgery were discussed and questions answered to the patient's satisfaction.  The patient wishes to proceed at this time.  Erlinda Hong, PA-C 09/28/2018, 7:11 AM [facility] [phone]

## 2018-09-28 NOTE — Plan of Care (Signed)
  Problem: Education: Goal: Knowledge of General Education information will improve Description: Including pain rating scale, medication(s)/side effects and non-pharmacologic comfort measures Outcome: Progressing   Problem: Health Behavior/Discharge Planning: Goal: Ability to manage health-related needs will improve Outcome: Progressing   Problem: Clinical Measurements: Goal: Ability to maintain clinical measurements within normal limits will improve Outcome: Progressing   Problem: Nutrition: Goal: Adequate nutrition will be maintained Outcome: Progressing   Problem: Coping: Goal: Level of anxiety will decrease Outcome: Progressing   Problem: Elimination: Goal: Will not experience complications related to urinary retention Outcome: Progressing   Problem: Pain Managment: Goal: General experience of comfort will improve Outcome: Progressing   Problem: Safety: Goal: Ability to remain free from injury will improve Outcome: Progressing   Problem: Skin Integrity: Goal: Risk for impaired skin integrity will decrease Outcome: Progressing   

## 2018-09-28 NOTE — Transfer of Care (Signed)
Immediate Anesthesia Transfer of Care Note  Patient: Alice [name]  Procedure(s) Performed: DEBRIDEMENT RIGHT LOWER LEG WITH PLACEMENT WITH PLACEMENT OF SKIN GRAFT AND VAC (Right Leg Lower)  Patient Location: PACU  Anesthesia Type:General  Level of Consciousness: drowsy  Airway & Oxygen Therapy: Patient Spontanous Breathing and Patient connected to face mask oxygen  Post-op Assessment: Report given to RN and Post -op Vital signs reviewed and stable  Post vital signs: Reviewed and stable  Last Vitals:  Vitals Value Taken Time  BP 121/76 09/28/2018  8:20 AM  Temp    Pulse 85 09/28/2018  8:22 AM  Resp 14 09/28/2018  8:22 AM  SpO2 100 % 09/28/2018  8:22 AM  Vitals shown include unvalidated device data.  Last Pain:  Vitals:   09/28/18 0647  TempSrc:   PainSc: 8       Patients Stated Pain Goal: 4 (29/29/09 0301)  Complications: No apparent anesthesia complications

## 2018-09-28 NOTE — Progress Notes (Signed)
Moderate bloody drainage noted from pt's surgical site of RLE. Sharol Given, MD called. Awaiting new canister for it to be changed. Sharol Given, MD to come to bedside. Will continue to monitor.

## 2018-09-28 NOTE — Progress Notes (Signed)
Sharol Given MD notified of patient's surgery site having a copious amount of drainage. Patient has soaked bottom sheet of right leg and foot and soaked 4 abdominal pads. Sharol Given MD went into surgery with another patient. Awaiting to change the wound vac out with surgeon.

## 2018-09-28 NOTE — Anesthesia Postprocedure Evaluation (Signed)
Anesthesia Post Note  Patient: Alice [name]  Procedure(s) Performed: DEBRIDEMENT RIGHT LOWER LEG WITH PLACEMENT WITH PLACEMENT OF SKIN GRAFT AND VAC (Right Leg Lower)     Patient location during evaluation: PACU Anesthesia Type: General Level of consciousness: awake and alert and oriented Pain management: pain level controlled Vital Signs Assessment: post-procedure vital signs reviewed and stable Respiratory status: spontaneous breathing, nonlabored ventilation and respiratory function stable Cardiovascular status: blood pressure returned to baseline and stable Postop Assessment: no apparent nausea or vomiting Anesthetic complications: no    Last Vitals:  Vitals:   09/28/18 0850 09/28/18 0905  BP: 128/76 118/71  Pulse: 80 84  Resp: 16 14  Temp:    SpO2: 98% 95%    Last Pain:  Vitals:   09/28/18 0905  TempSrc:   PainSc: 4                  Kaelea Gathright A.

## 2018-09-28 NOTE — Progress Notes (Signed)
1157: Pt arrived to 6N24 via bed after surgery. Upon arrival to unit, pt's bed linen and coban dressing soaked with bloody drainage with a constant drip. Wound vac beeping with a consistent "blockage" message. Received report from Mickel Baas, [place] in PACU. Charge RN Blanch Media, [place] aware.   1202Sharol Given, MD called and stated he would come to bedside.   1228: Increased bloody drainage noted to RLE to back of leg. VSS. Pressure held to site. Awaiting Sharol Given, MD arrival. Will continue to monitor.

## 2018-09-28 NOTE — Progress Notes (Signed)
Spoke with Blanch Media RN on the phone, informed them that the dressing was dry, clean, and intact prior to transport. The sheet under her that had old blood on it from before the dressing and wound vac was changed with Sharol Given MD and OR nurse in PACU. Since patient was in a stretcher and needed to be transferred to the bed upon arriving to patient's room the sheet was not changed. Patient's vitals were stable upon leaving PACU and no bloody drainage was noted. Informed the nurse that Sharol Given MD assessed the patient and said to send the patient to the unit.

## 2018-09-28 NOTE — Progress Notes (Signed)
Sharol Given MD came and assessed wound vac and dressing, verbal order to hold her in the PACU for now and to wait for next plan of action. Will continue to monitor.

## 2018-09-28 NOTE — Progress Notes (Signed)
Patient is bleeding a copious amount at right lower leg and dressing is bloody. I wrapped the lower right leg with kerlex and 4 abdominal pads. MD notified. Will continue to monitor.

## 2018-09-29 ENCOUNTER — Encounter (HOSPITAL_COMMUNITY): Payer: Self-pay | Admitting: Orthopedic Surgery

## 2018-09-29 LAB — PROTIME-INR
INR: 2.6 — ABNORMAL HIGH (ref 0.8–1.2)
Prothrombin Time: 27.2 seconds — ABNORMAL HIGH (ref 11.4–15.2)

## 2018-09-29 NOTE — Plan of Care (Signed)
  Problem: Pain Managment: Goal: General experience of comfort will improve Outcome: Progressing   Problem: Safety: Goal: Ability to remain free from injury will improve Outcome: Progressing   Problem: Skin Integrity: Goal: Risk for impaired skin integrity will decrease Outcome: Progressing   

## 2018-09-29 NOTE — Plan of Care (Signed)

## 2018-09-29 NOTE — Progress Notes (Signed)
Subjective: 1 Day Post-Op Procedure(s) (LRB): DEBRIDEMENT RIGHT LOWER LEG WITH PLACEMENT WITH PLACEMENT OF SKIN GRAFT AND VAC (Right) Patient reports pain as moderate.  Dressing just changed by nursing staff. Vac left in place.   Objective: Vital signs in last 24 hours: Temp:  [97.5 F (36.4 C)-98.2 F (36.8 C)] 98.1 F (36.7 C) (05/16 0519) Pulse Rate:  [70-106] 79 (05/16 0519) Resp:  [11-27] 15 (05/16 0519) BP: (98-138)/(61-97) 122/78 (05/16 0812) SpO2:  [90 %-100 %] 100 % (05/16 0519)  Intake/Output from previous day: 05/15 0701 - 05/16 0700 In: 770 [P.O.:120; I.V.:500; IV Piggyback:150] Out: 805 [Urine:350; Drains:450; Blood:5] Intake/Output this shift: No intake/output data recorded.  Recent Labs    09/28/18 0559  HGB 10.6*   Recent Labs    09/28/18 0559  WBC 5.6  RBC 4.35  HCT 33.3*  PLT 280   Recent Labs    09/28/18 0559  NA 138  K 3.3*  CL 104  CO2 24  BUN 26*  CREATININE 1.42*  GLUCOSE 80  CALCIUM 8.8*   Recent Labs    09/28/18 0559 09/29/18 0402  INR 2.7* 2.6*   Vac in place  Compartment soft  Able to wiggle toes    Assessment/Plan: 1 Day Post-Op Procedure(s) (LRB): DEBRIDEMENT RIGHT LOWER LEG WITH PLACEMENT WITH PLACEMENT OF SKIN GRAFT AND VAC (Right) Planned return to surgery Wednesday  Monitor dressing for continued bleeding. INR 2.6 today.       Jones 09/29/2018, 8:44 AM

## 2018-09-30 ENCOUNTER — Encounter (HOSPITAL_COMMUNITY): Payer: Self-pay | Admitting: *Deleted

## 2018-09-30 LAB — PROTIME-INR
INR: 2.3 — ABNORMAL HIGH (ref 0.8–1.2)
Prothrombin Time: 25 seconds — ABNORMAL HIGH (ref 11.4–15.2)

## 2018-09-30 LAB — SURGICAL PCR SCREEN
MRSA, PCR: NEGATIVE
Staphylococcus aureus: NEGATIVE

## 2018-09-30 NOTE — Plan of Care (Signed)

## 2018-10-01 DIAGNOSIS — Z885 Allergy status to narcotic agent status: Secondary | ICD-10-CM

## 2018-10-01 DIAGNOSIS — I83019 Varicose veins of right lower extremity with ulcer of unspecified site: Secondary | ICD-10-CM

## 2018-10-01 DIAGNOSIS — B964 Proteus (mirabilis) (morganii) as the cause of diseases classified elsewhere: Secondary | ICD-10-CM

## 2018-10-01 DIAGNOSIS — L089 Local infection of the skin and subcutaneous tissue, unspecified: Secondary | ICD-10-CM

## 2018-10-01 DIAGNOSIS — Z978 Presence of other specified devices: Secondary | ICD-10-CM

## 2018-10-01 DIAGNOSIS — L97919 Non-pressure chronic ulcer of unspecified part of right lower leg with unspecified severity: Secondary | ICD-10-CM

## 2018-10-01 LAB — PROTIME-INR
INR: 1.4 — ABNORMAL HIGH (ref 0.8–1.2)
Prothrombin Time: 17.3 seconds — ABNORMAL HIGH (ref 11.4–15.2)

## 2018-10-01 MED ORDER — SODIUM CHLORIDE 0.9 % IV SOLN
INTRAVENOUS | Status: DC | PRN
  Administered 2018-10-01: 17:00:00 via INTRAVENOUS

## 2018-10-01 MED ORDER — SODIUM CHLORIDE 0.9 % IV SOLN
3.0000 g | Freq: Four times a day (QID) | INTRAVENOUS | Status: DC
  Administered 2018-10-01 – 2018-10-03 (×8): 3 g via INTRAVENOUS
  Filled 2018-10-01 (×12): qty 3

## 2018-10-01 NOTE — Progress Notes (Signed)
Patient ID: Alice [name], female   DOB: 12-26-1970, 48 y.o.   MRN: [id number] Postoperative day 3 debridement right calf chronic wound with biofilm.  There is 250 cc in the wound VAC canister.  Culture Gram stain show gram-positive cocci in pairs and gram-positive rods.  Sensitivities pending.  PCR MRSA screen negative.  With the chronic wound and chronic biofilm patient may benefit from long-term IV antibiotics.  Will consult infectious disease.

## 2018-10-01 NOTE — Consult Note (Signed)
[facility] for Infectious Disease    Date of Admission:  09/28/2018           Day 4 cefazolin       Reason for Consult: Right leg wound infection    Referring Provider: Dr. Meridee Score  Assessment: She has a polymicrobial right leg wound infection.  I will expand coverage to ampicillin sulbactam.  I will follow-up on Wednesday, 10/03/2018, to provide recommendations about discharge antibiotic therapy.  Plan: 1. 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 amLODipine  10 mg Oral Daily  . buPROPion  300 mg Oral Daily  . docusate sodium  100 mg Oral BID  . hydrochlorothiazide  25 mg Oral Daily  . multivitamin with minerals  1 tablet Oral Daily  . pantoprazole  40 mg Oral Daily  . pregabalin  200 mg Oral BID   Continuous Infusions: .  ceFAZolin (ANCEF) IV 1 g (10/01/18 0630)   PRN Meds:.acetaminophen, albuterol, HYDROmorphone (DILAUDID) injection, metoCLOPramide **OR** metoCLOPramide (REGLAN) injection, ondansetron **OR** ondansetron (ZOFRAN) IV, oxyCODONE, oxyCODONE, tiZANidine  HPI: Alice [name] is a 48 y.o. female with a history of chronic right lower extremity venous stasis ulcer.  She recently developed green drainage from her wound leading to admission on 09/28/2018.  She underwent debridement and skin grafting.  Operative Gram stain shows gram-positive cocci in pairs and gram-positive rods.  Culture is growing a pansensitive Proteus.  She has been to be taken back to the operating room in 48 hours for a second look procedure.   Review of Systems: Review of Systems  Constitutional: Negative for chills, diaphoresis and fever.  Gastrointestinal: Negative  for abdominal pain, diarrhea, nausea and vomiting.  Musculoskeletal: Positive for joint pain.    Past Medical History:  Diagnosis Date  . Anemia   . Anxiety   . Arthritis   . Chronic bronchitis ([place])   . Chronic upper back pain   . Depression   . DVT (deep venous thrombosis) (HCC)    BLE  . GERD (gastroesophageal reflux disease)   . Headache    "weekly" (04/16/2015)  . Hypertension   . Migraine    "monthly" (04/16/2015)  . Morbid obesity with BMI of 50.0-59.9, adult ([place])   . Obesity   . Pneumonia 2014  . Pulmonary embolism ([place])   . Sinusitis nasal   . Sleep apnea    "I'm suppose to wear a mask but I don't" (04/16/2015)  . Varicose veins of right lower extremity   . Venous stasis of lower extremity    right  . Wears dentures   . Wears glasses     Social History   Tobacco Use  . Smoking status: Never Smoker  . Smokeless tobacco: Never Used  Substance Use Topics  . Alcohol use: No  . Drug use: No    Family History  Problem Relation Age of Onset  . Kidney disease Mother        kidney transplant   Allergies  Allergen Reactions  . Oxycodone Hcl Nausea And Vomiting  . Vicodin [Hydrocodone-Acetaminophen] Nausea And Vomiting    OBJECTIVE: Blood pressure  109/65, pulse 90, temperature 98.3 F (36.8 C), temperature source Oral, resp. rate 18, height 5\' 6"  (1.676 m), weight (!) 149.2 kg, SpO2 90 %.  Physical Exam Constitutional:      Comments: She is pleasant and in no distress.  She is sitting up in bed talking on the phone.  Musculoskeletal:     Comments: Her right lower leg is wrapped over a VAC wound dressing.     Lab Results Lab Results  Component Value Date   WBC 5.6 09/28/2018   HGB 10.6 (L) 09/28/2018   HCT 33.3 (L) 09/28/2018   MCV 76.6 (L) 09/28/2018   PLT 280 09/28/2018    Lab Results  Component Value Date   CREATININE 1.42 (H) 09/28/2018   BUN 26 (H) 09/28/2018   NA 138 09/28/2018   K 3.3 (L) 09/28/2018   CL 104 09/28/2018   CO2 24  09/28/2018    Lab Results  Component Value Date   ALT 11 01/29/2018   AST 26 01/29/2018   ALKPHOS 114 01/29/2018   BILITOT 0.3 01/29/2018     Microbiology: Recent Results (from the past 240 hour(s))  SARS Coronavirus 2 (CEPHEID - Performed in [facility] hospital lab), Hosp Order     Status: None   Collection Time: 09/26/18  2:40 PM  Result Value Ref Range Status   SARS Coronavirus 2 NEGATIVE NEGATIVE Final    Comment: (NOTE) If result is NEGATIVE SARS-CoV-2 target nucleic acids are NOT DETECTED. The SARS-CoV-2 RNA is generally detectable in upper and lower  respiratory specimens during the acute phase of infection. The lowest  concentration of SARS-CoV-2 viral copies this assay can detect is 250  copies / mL. A negative result does not preclude SARS-CoV-2 infection  and should not be used as the sole basis for treatment or other  patient management decisions.  A negative result may occur with  improper specimen collection / handling, submission of specimen other  than nasopharyngeal swab, presence of viral mutation(s) within the  areas targeted by this assay, and inadequate number of viral copies  (<250 copies / mL). A negative result must be combined with clinical  observations, patient history, and epidemiological information. If result is POSITIVE SARS-CoV-2 target nucleic acids are DETECTED. The SARS-CoV-2 RNA is generally detectable in upper and lower  respiratory specimens dur ing the acute phase of infection.  Positive  results are indicative of active infection with SARS-CoV-2.  Clinical  correlation with patient history and other diagnostic information is  necessary to determine patient infection status.  Positive results do  not rule out bacterial infection or co-infection with other viruses. If result is PRESUMPTIVE POSTIVE SARS-CoV-2 nucleic acids MAY BE PRESENT.   A presumptive positive result was obtained on the submitted specimen  and confirmed on repeat  testing.  While 2019 novel coronavirus  (SARS-CoV-2) nucleic acids may be present in the submitted sample  additional confirmatory testing may be necessary for epidemiological  and / or clinical management purposes  to differentiate between  SARS-CoV-2 and other Sarbecovirus currently known to infect humans.  If clinically indicated additional testing with an alternate test  methodology [phone]) is advised. The SARS-CoV-2 RNA is generally  detectable in upper and lower respiratory sp ecimens during the acute  phase of infection. The expected result is Negative. Fact Sheet for Patients:  StrictlyIdeas.no Fact Sheet for Healthcare Providers: BankingDealers.co.za This test is not yet approved or cleared by the [place] FDA and has been authorized for detection and/or  diagnosis of SARS-CoV-2 by FDA under an Emergency Use Authorization (EUA).  This EUA will remain in effect (meaning this test can be used) for the duration of the COVID-19 declaration under Section 564(b)(1) of the Act, 21 U.S.C. section 360bbb-3(b)(1), unless the authorization is terminated or revoked sooner. Performed at [facility] [place] [address] [place], [place] [address]   Aerobic/Anaerobic Culture (surgical/deep wound)     Status: None (Preliminary result)   Collection Time: 09/28/18  7:52 AM  Result Value Ref Range Status   Specimen Description TISSUE RIGHT LOWER LEG  Final   Special Requests NONE  Final   Gram Stain   Final    FEW WBC PRESENT, PREDOMINANTLY PMN FEW GRAM POSITIVE COCCI IN PAIRS FEW GRAM POSITIVE RODS Performed at [facility], [place] [address] [place], [place] [address]    Culture MODERATE PROTEUS MIRABILIS  Final   Report Status PENDING  Incomplete   Organism ID, Bacteria PROTEUS MIRABILIS  Final      Susceptibility   Proteus mirabilis - MIC*    AMPICILLIN <=2 SENSITIVE Sensitive     CEFAZOLIN <=4 SENSITIVE  Sensitive     CEFEPIME <=1 SENSITIVE Sensitive     CEFTAZIDIME <=1 SENSITIVE Sensitive     CEFTRIAXONE <=1 SENSITIVE Sensitive     CIPROFLOXACIN <=0.25 SENSITIVE Sensitive     GENTAMICIN <=1 SENSITIVE Sensitive     IMIPENEM 2 SENSITIVE Sensitive     TRIMETH/SULFA <=20 SENSITIVE Sensitive     AMPICILLIN/SULBACTAM <=2 SENSITIVE Sensitive     PIP/TAZO <=4 SENSITIVE Sensitive     * MODERATE PROTEUS MIRABILIS  Surgical pcr screen     Status: None   Collection Time: 09/30/18  3:09 PM  Result Value Ref Range Status   MRSA, PCR NEGATIVE NEGATIVE Final   Staphylococcus aureus NEGATIVE NEGATIVE Final    Comment: (NOTE) The Xpert SA Assay (FDA approved for NASAL specimens in patients 46 years of age and older), is one component of a comprehensive surveillance program. It is not intended to diagnose infection nor to guide or monitor treatment. Performed at [facility], [place] [address] [place], [place] [address]     Michel Bickers, [facility] for [facility] [phone] pager   [phone] cell 10/01/2018, 11:20 AM

## 2018-10-01 NOTE — Progress Notes (Signed)
Pharmacy Antibiotic Note  Alice [name] is a 48 y.o. female admitted on 09/28/2018 with right leg wound infection. Antibiotics changed from Cefazolin to Unasyn per ID recs. Pharmacy has been consulted for Unasyn dosing.  Plan: Unasyn 3g IV q6h Monitor renal function, cultures, clinical course  Height: 5\' 6"  (167.6 cm) Weight: (!) 329 lb (149.2 kg) IBW/kg (Calculated) : 59.3  Temp (24hrs), Avg:98.4 F (36.9 C), Min:98.2 F (36.8 C), Max:98.6 F (37 C)  Recent Labs  Lab 09/28/18 0559  WBC 5.6  CREATININE 1.42*    Estimated Creatinine Clearance: 74.5 mL/min (A) (by C-G formula based on SCr of 1.42 mg/dL (H)).    Allergies  Allergen Reactions  . Oxycodone Hcl Nausea And Vomiting  . Vicodin [Hydrocodone-Acetaminophen] Nausea And Vomiting    Antimicrobials this admission: 5/15 Cefazolin >> 5/18 5/18 Unasyn >>   Dose adjustments this admission: --  Microbiology results: 5/15 tissue, R lower leg: moderate Proteus mirabilis (pan-sensitive), final cx results pending  5/17 MRSA PCR: neg  Thank you for allowing pharmacy to be a part of this patient's care.   Lindell Spar, PharmD, BCPS Pager: [phone] 10/01/2018 11:43 AM

## 2018-10-02 ENCOUNTER — Encounter (HOSPITAL_COMMUNITY): Payer: Self-pay | Admitting: *Deleted

## 2018-10-02 DIAGNOSIS — B957 Other staphylococcus as the cause of diseases classified elsewhere: Secondary | ICD-10-CM

## 2018-10-02 LAB — CREATININE, SERUM
Creatinine, Ser: 1.27 mg/dL — ABNORMAL HIGH (ref 0.44–1.00)
GFR calc Af Amer: 59 mL/min — ABNORMAL LOW (ref >60)
GFR calc non Af Amer: 51 mL/min — ABNORMAL LOW (ref >60)

## 2018-10-02 LAB — PROTIME-INR
INR: 1.2 (ref 0.8–1.2)
Prothrombin Time: 14.9 seconds (ref 11.4–15.2)

## 2018-10-02 MED ORDER — POVIDONE-IODINE 10 % EX SWAB: 2.0000 "application " | Freq: Once | CUTANEOUS | Status: DC

## 2018-10-02 MED ORDER — CHLORHEXIDINE GLUCONATE 4 % EX LIQD
60.0000 mL | Freq: Once | CUTANEOUS | Status: AC
  Administered 2018-10-02: 4 via TOPICAL
  Filled 2018-10-02: qty 60

## 2018-10-02 MED ORDER — TRANEXAMIC ACID-NACL 1000-0.7 MG/100ML-% IV SOLN
1000.0000 mg | INTRAVENOUS | Status: AC
  Administered 2018-10-03: 1000 mg via INTRAVENOUS
  Filled 2018-10-02 (×2): qty 100

## 2018-10-02 MED ORDER — TRANEXAMIC ACID 1000 MG/10ML IV SOLN
2000.0000 mg | INTRAVENOUS | Status: DC
  Filled 2018-10-02: qty 20

## 2018-10-02 NOTE — H&P (View-Only) (Signed)
Patient ID: Alice [name], female   DOB: Jul 07, 1970, 48 y.o.   MRN: [id number] Patient is a 48 year old woman who is status post debridement chronic venous ulcer to her right calf.  Gram stain's are positive for gram-positive cocci and gram-negative rods.  Thank you for infectious disease consult her coverage has been expanded to include Unasyn.  And anticipate discharge IV antibiotics to be determined later this week.  Patient has a PICC line placed.  There is 400 cc in the wound VAC canister still with significant drainage.  Plan for return to the operating room tomorrow Wednesday with repeat debridement and possible skin graft or skin grafting on Friday.

## 2018-10-02 NOTE — TOC Initial Note (Signed)
Transition of Care [facility]) - Initial/Assessment Note    Patient Details  Name: Alice [name] MRN: [id number] Date of Birth: December 10, 1970  [facility] CM/SW Contact:    Marilu Favre, RN Phone Number: 10/02/2018, 11:08 AM  Clinical Narrative:                 Confirmed face sheet information. Patient lives alone.   Patient aware she may need IV ABX at home at discharge. Explained home health nurse will not be there everytime a dose is due , she and family/ friend will be taught. She has had a prevenva VAC before.   Patient lives by self , she may stay with her Aunt at discharge. Her aunt has [place] address.   Provided patient with Medicare.gov list of home health agencies. Will follow patient for discharge needs.  Place home [facility] paperwork on chart in case needed. Expected Discharge Plan: [place] Barriers to Discharge: Continued Medical Work up   Patient Goals and CMS Choice Patient states their goals for this hospitalization and ongoing recovery are:: to go home  CMS Medicare.gov Compare Post Acute Care list provided to:: Patient Choice offered to / list presented to : Patient  Expected Discharge Plan and Services Expected Discharge Plan: [place]   Discharge Planning Services: CM Consult   Living arrangements for the past 2 months: Apartment                   DME Agency: NA                  Prior Living Arrangements/Services Living arrangements for the past 2 months: Apartment Lives with:: Self Patient language and need for interpreter reviewed:: Yes Do you feel safe going back to the place where you live?: Yes      Need for Family Participation in Patient Care: Yes (Comment) Care giver support system in place?: Yes (comment)   Criminal Activity/Legal Involvement Pertinent to Current Situation/Hospitalization: No - Comment as needed  Activities of Daily Living Home Assistive Devices/Equipment:  Eyeglasses, Dentures (specify type)(upppers) ADL Screening (condition at time of admission) Patient's cognitive ability adequate to safely complete daily activities?: Yes Is the patient deaf or have difficulty hearing?: No Does the patient have difficulty seeing, even when wearing glasses/contacts?: No Does the patient have difficulty concentrating, remembering, or making decisions?: No Patient able to express need for assistance with ADLs?: Yes Does the patient have difficulty dressing or bathing?: No Independently performs ADLs?: Yes (appropriate for developmental age) Does the patient have difficulty walking or climbing stairs?: Yes Weakness of Legs: Right Weakness of Arms/Hands: None  Permission Sought/Granted Permission sought to share information with : Case Manager Permission granted to share information with : Yes, Verbal Permission Granted     Permission granted to share info w AGENCY: [facility]         Emotional Assessment Appearance:: Appears stated age Attitude/Demeanor/Rapport: Engaged Affect (typically observed): Adaptable Orientation: : Oriented to Self, Oriented to Place, Oriented to  Time, Oriented to Situation Alcohol / Substance Use: Not Applicable Psych Involvement: No (comment)  Admission diagnosis:  Chronic Ulcer Right Lower Leg Patient Active Problem List   Diagnosis Date Noted  . Chronic ulcer of right leg, with fat layer exposed ([place]) 09/28/2018  . Chronic ulcer of leg, right, with necrosis of muscle ([place])   . Iron deficiency anemia 01/29/2018  . Pulmonary embolus ([place]) 01/09/2018  . Encounter  for therapeutic drug monitoring 01/09/2018  . Severe malnutrition ([place]) 08/24/2017  . Acute kidney injury superimposed on CKD ([place]) 12/28/2016  . Hx of skin graft 08/01/2016  . 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 Trichomonal infection 11/24/2015  . Abdominal pain 05/02/2015  . Symptomatic cholelithiasis 04/16/2015  . Acute bilateral upper abdominal pain 04/16/2015  . Microcytic anemia 05/18/2013  . Hypotension, unspecified 05/17/2013  . CKD (chronic kidney disease), stage III ([place]) 05/17/2013  . Hypokalemia 05/17/2013  . Anal fissure 02/06/2013  . Anal skin tag 02/06/2013  . ALLERGIC RHINITIS 03/05/2010  . LOW BACK PAIN SYNDROME 12/13/2007  . Local infection of skin and subcutaneous tissue 10/10/2007  . OBESITY, MORBID 12/02/2006  . HTN (hypertension) 12/02/2006  . History of pulmonary embolism 12/02/2006  . History of DVT (deep vein thrombosis) 12/02/2006  . 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 GASTROESOPHAGEAL REFLUX DISEASE 12/02/2006  . OSA (obstructive sleep apnea) 12/02/2006   PCP:  Benito Mccreedy, MD Pharmacy:   [facility] [address] [place] [address] Phone: [phone] Fax: [place], [place] [address] Phone: [phone] Fax: [phone]     Social Determinants of Health (SDOH) Interventions    Readmission Risk Interventions No flowsheet data found.

## 2018-10-02 NOTE — Plan of Care (Signed)

## 2018-10-02 NOTE — Progress Notes (Signed)
Pt c/o of burning while voiding. Will continue to monitor.

## 2018-10-02 NOTE — Progress Notes (Addendum)
[facility] for Infectious Disease  Date of Admission:  09/28/2018   Total days of antibiotics 5        Day two of Unasyn 05/18 -->        Three days of Cefazolin 05/15-->05/18              Reason for Consult: Local skin and subcutaneous infection of a chronic venous stasis ulcer, RLE    Referring Provider: Dr. Sharol Given Primary Care Provider: Benito Mccreedy, MD  Assessment: Lower extremity wound 2/2 venous stasis. S/p debridement of the wound with VAC sponge placemen by orthopedics. The patients wound culture is currently yielding a gram positive rod to be identified, proteus mirabilis, staph caprae, and an additional gram negative rod. Given the nature of the wound there is likely present anaerobic species unidentified. Consider PO Augmentin 875 q12 hours to complete a minimum of a two week treatment course once clinically stable.  Plan: 1. Would continue Unasyn until evaluation of the wound by orthopedics.  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 amLODipine  10 mg Oral Daily  . buPROPion  300 mg Oral Daily  . docusate sodium  100 mg Oral BID  . hydrochlorothiazide  25 mg Oral Daily  . multivitamin with minerals  1 tablet Oral Daily  . pantoprazole  40 mg Oral Daily  . pregabalin  200 mg Oral BID   Continuous Infusions: . sodium chloride Stopped (10/01/18 1704)  . ampicillin-sulbactam (UNASYN) IV 3 g (10/02/18 0500)   PRN Meds:.sodium chloride, acetaminophen, albuterol, HYDROmorphone (DILAUDID) injection, metoCLOPramide **OR** metoCLOPramide (REGLAN) injection, ondansetron **OR** ondansetron (ZOFRAN) IV, oxyCODONE, oxyCODONE, tiZANidine   SUBJECTIVE: The patient stated that she was feeling  well today as long as she lies still and does not move her leg. She understands the current plan and is in agreement with continued treatment.  Review of Systems: Review of Systems  Constitutional: Negative for chills, diaphoresis and fever.  Respiratory: Negative for cough and shortness of breath.   Cardiovascular: Positive for leg swelling (RLE). Negative for chest pain.  Gastrointestinal: Negative for constipation, diarrhea, nausea and vomiting.  Genitourinary: Negative for flank pain and urgency.  Musculoskeletal:       RLE pain with ambulation  Neurological: Negative for dizziness and headaches.  Psychiatric/Behavioral: Negative for depression. The patient is not nervous/anxious.     Allergies  Allergen Reactions  . Oxycodone Hcl Nausea And Vomiting  . Vicodin [Hydrocodone-Acetaminophen] Nausea And Vomiting    OBJECTIVE: Vitals:   10/01/18 0439 10/01/18 1554 10/01/18 2142 10/02/18 0443  BP: 109/65 121/61 (!) 102/47 (!) 104/49  Pulse: 90 90 65 91  Resp: 18 18 18 16   Temp: 98.3 F (36.8 C) 98.2 F (36.8 C) 98.5 F (36.9 C) 98.3 F (36.8 C)  TempSrc: Oral Oral Oral Oral  SpO2: 90% 100% 97% 97%  Weight:      Height:       Body mass index is 53.1 kg/m.  Physical Exam Constitutional:      General: She is not in acute distress.    Appearance: She is well-developed. She is not diaphoretic.  Cardiovascular:     Rate and  Rhythm: Normal rate and regular rhythm.     Heart sounds: No murmur.  Pulmonary:     Effort: Pulmonary effort is normal. No respiratory distress.     Breath sounds: Normal breath sounds. No stridor.  Abdominal:     General: Bowel sounds are normal. There is no distension.     Palpations: Abdomen is soft.     Tenderness: There is no abdominal tenderness.  Musculoskeletal:        General: Tenderness (RLE, unable to view due to wound dressing) present.  Skin:    General: Skin is warm.     Capillary Refill: Capillary refill takes less than 2 seconds.   Neurological:     Mental Status: She is alert and oriented to person, place, and time.    Lab Results Lab Results  Component Value Date   WBC 5.6 09/28/2018   HGB 10.6 (L) 09/28/2018   HCT 33.3 (L) 09/28/2018   MCV 76.6 (L) 09/28/2018   PLT 280 09/28/2018    Lab Results  Component Value Date   CREATININE 1.27 (H) 10/02/2018   BUN 26 (H) 09/28/2018   NA 138 09/28/2018   K 3.3 (L) 09/28/2018   CL 104 09/28/2018   CO2 24 09/28/2018    Lab Results  Component Value Date   ALT 11 01/29/2018   AST 26 01/29/2018   ALKPHOS 114 01/29/2018   BILITOT 0.3 01/29/2018     Microbiology: Recent Results (from the past 240 hour(s))  SARS Coronavirus 2 (CEPHEID - Performed in [facility] hospital lab), Hosp Order     Status: None   Collection Time: 09/26/18  2:40 PM  Result Value Ref Range Status   SARS Coronavirus 2 NEGATIVE NEGATIVE Final    Comment: (NOTE) If result is NEGATIVE SARS-CoV-2 target nucleic acids are NOT DETECTED. The SARS-CoV-2 RNA is generally detectable in upper and lower  respiratory specimens during the acute phase of infection. The lowest  concentration of SARS-CoV-2 viral copies this assay can detect is 250  copies / mL. A negative result does not preclude SARS-CoV-2 infection  and should not be used as the sole basis for treatment or other  patient management decisions.  A negative result may occur with  improper specimen collection / handling, submission of specimen other  than nasopharyngeal swab, presence of viral mutation(s) within the  areas targeted by this assay, and inadequate number of viral copies  (<250 copies / mL). A negative result must be combined with clinical  observations, patient history, and epidemiological information. If result is POSITIVE SARS-CoV-2 target nucleic acids are DETECTED. The SARS-CoV-2 RNA is generally detectable in upper and lower  respiratory specimens dur ing the acute phase of infection.  Positive  results are  indicative of active infection with SARS-CoV-2.  Clinical  correlation with patient history and other diagnostic information is  necessary to determine patient infection status.  Positive results do  not rule out bacterial infection or co-infection with other viruses. If result is PRESUMPTIVE POSTIVE SARS-CoV-2 nucleic acids MAY BE PRESENT.   A presumptive positive result was obtained on the submitted specimen  and confirmed on repeat testing.  While 2019 novel coronavirus  (SARS-CoV-2) nucleic acids may be present in the submitted sample  additional confirmatory testing may be necessary for epidemiological  and / or clinical management purposes  to differentiate between  SARS-CoV-2 and other Sarbecovirus currently known to infect humans.  If clinically indicated additional testing with an alternate test  methodology [phone]) is  advised. The SARS-CoV-2 RNA is generally  detectable in upper and lower respiratory sp ecimens during the acute  phase of infection. The expected result is Negative. Fact Sheet for Patients:  StrictlyIdeas.no Fact Sheet for Healthcare Providers: BankingDealers.co.za This test is not yet approved or cleared by the [place] FDA and has been authorized for detection and/or diagnosis of SARS-CoV-2 by FDA under an Emergency Use Authorization (EUA).  This EUA will remain in effect (meaning this test can be used) for the duration of the COVID-19 declaration under Section 564(b)(1) of the Act, 21 U.S.C. section 360bbb-3(b)(1), unless the authorization is terminated or revoked sooner. Performed at [facility], [place] [address] [place], [place] [address]   Aerobic/Anaerobic Culture (surgical/deep wound)     Status: None (Preliminary result)   Collection Time: 09/28/18  7:52 AM  Result Value Ref Range Status   Specimen Description TISSUE RIGHT LOWER LEG  Final   Special Requests NONE  Final   Gram  Stain   Final    FEW WBC PRESENT, PREDOMINANTLY PMN FEW GRAM POSITIVE COCCI IN PAIRS FEW GRAM POSITIVE RODS Performed at [facility], [place] [address] [place], [place] [address]    Culture   Final    MODERATE PROTEUS MIRABILIS MODERATE STAPHYLOCOCCUS CAPRAE MODERATE GRAM NEGATIVE RODS    Report Status PENDING  Incomplete   Organism ID, Bacteria PROTEUS MIRABILIS  Final   Organism ID, Bacteria STAPHYLOCOCCUS CAPRAE  Final      Susceptibility   Staphylococcus caprae - MIC*    CIPROFLOXACIN <=0.5 SENSITIVE Sensitive     ERYTHROMYCIN <=0.25 SENSITIVE Sensitive     GENTAMICIN <=0.5 SENSITIVE Sensitive     OXACILLIN <=0.25 SENSITIVE Sensitive     TETRACYCLINE <=1 SENSITIVE Sensitive     VANCOMYCIN <=0.5 SENSITIVE Sensitive     TRIMETH/SULFA <=10 SENSITIVE Sensitive     CLINDAMYCIN <=0.25 SENSITIVE Sensitive     RIFAMPIN <=0.5 SENSITIVE Sensitive     Inducible Clindamycin NEGATIVE Sensitive     * MODERATE STAPHYLOCOCCUS CAPRAE   Proteus mirabilis - MIC*    AMPICILLIN <=2 SENSITIVE Sensitive     CEFAZOLIN <=4 SENSITIVE Sensitive     CEFEPIME <=1 SENSITIVE Sensitive     CEFTAZIDIME <=1 SENSITIVE Sensitive     CEFTRIAXONE <=1 SENSITIVE Sensitive     CIPROFLOXACIN <=0.25 SENSITIVE Sensitive     GENTAMICIN <=1 SENSITIVE Sensitive     IMIPENEM 2 SENSITIVE Sensitive     TRIMETH/SULFA <=20 SENSITIVE Sensitive     AMPICILLIN/SULBACTAM <=2 SENSITIVE Sensitive     PIP/TAZO <=4 SENSITIVE Sensitive     * MODERATE PROTEUS MIRABILIS  Surgical pcr screen     Status: None   Collection Time: 09/30/18  3:09 PM  Result Value Ref Range Status   MRSA, PCR NEGATIVE NEGATIVE Final   Staphylococcus aureus NEGATIVE NEGATIVE Final    Comment: (NOTE) The Xpert SA Assay (FDA approved for NASAL specimens in patients 54 years of age and older), is one component of a comprehensive surveillance program. It is not intended to diagnose infection nor to guide or monitor treatment. Performed at [facility], [place] [address], [place], [place] [address]     Kathi Ludwig, [facility] [phone] pager   [phone] cell 10/02/2018, 10:08 AM

## 2018-10-02 NOTE — Progress Notes (Signed)
Patient ID: Alice [name], female   DOB: 1971/03/24, 48 y.o.   MRN: [id number] Patient is a 48 year old woman who is status post debridement chronic venous ulcer to her right calf.  Gram stain's are positive for gram-positive cocci and gram-negative rods.  Thank you for infectious disease consult her coverage has been expanded to include Unasyn.  And anticipate discharge IV antibiotics to be determined later this week.  Patient has a PICC line placed.  There is 400 cc in the wound VAC canister still with significant drainage.  Plan for return to the operating room tomorrow Wednesday with repeat debridement and possible skin graft or skin grafting on Friday.

## 2018-10-03 ENCOUNTER — Inpatient Hospital Stay (HOSPITAL_COMMUNITY): Payer: Medicare Other | Admitting: Anesthesiology

## 2018-10-03 ENCOUNTER — Inpatient Hospital Stay (HOSPITAL_COMMUNITY): Admission: RE | Admit: 2018-10-03 | Payer: Medicare Other | Source: Home / Self Care | Admitting: Orthopedic Surgery

## 2018-10-03 ENCOUNTER — Encounter (HOSPITAL_COMMUNITY): Payer: Self-pay | Admitting: *Deleted

## 2018-10-03 ENCOUNTER — Encounter (HOSPITAL_COMMUNITY)
Admission: RE | Disposition: A | Discharge status: Rehabilitation Facility | Payer: Self-pay | Source: Home / Self Care | Attending: Orthopedic Surgery

## 2018-10-03 DIAGNOSIS — R062 Wheezing: Secondary | ICD-10-CM

## 2018-10-03 DIAGNOSIS — Z945 Skin transplant status: Secondary | ICD-10-CM

## 2018-10-03 DIAGNOSIS — B9689 Other specified bacterial agents as the cause of diseases classified elsewhere: Secondary | ICD-10-CM

## 2018-10-03 HISTORY — PX: I&D EXTREMITY: SHX5045

## 2018-10-03 LAB — AEROBIC/ANAEROBIC CULTURE W GRAM STAIN (SURGICAL/DEEP WOUND)

## 2018-10-03 LAB — PROTIME-INR
INR: 1.1 (ref 0.8–1.2)
Prothrombin Time: 14.3 seconds (ref 11.4–15.2)

## 2018-10-03 SURGERY — IRRIGATION AND DEBRIDEMENT EXTREMITY
Anesthesia: General | Laterality: Right

## 2018-10-03 MED ORDER — LIDOCAINE 2% (20 MG/ML) 5 ML SYRINGE
INTRAMUSCULAR | Status: DC | PRN
  Administered 2018-10-03: 100 mg via INTRAVENOUS

## 2018-10-03 MED ORDER — DOCUSATE SODIUM 100 MG PO CAPS: 100.0000 mg | ORAL_CAPSULE | Freq: Two times a day (BID) | ORAL | Status: DC

## 2018-10-03 MED ORDER — PROPOFOL 10 MG/ML IV BOLUS
INTRAVENOUS | Status: DC | PRN
  Administered 2018-10-03: 150 mg via INTRAVENOUS

## 2018-10-03 MED ORDER — FENTANYL CITRATE (PF) 250 MCG/5ML IJ SOLN
INTRAMUSCULAR | Status: AC
  Filled 2018-10-03: qty 5

## 2018-10-03 MED ORDER — MIDAZOLAM HCL 2 MG/2ML IJ SOLN
INTRAMUSCULAR | Status: AC
  Filled 2018-10-03: qty 2

## 2018-10-03 MED ORDER — ONDANSETRON HCL 4 MG/2ML IJ SOLN: 4.0000 mg | Freq: Four times a day (QID) | INTRAMUSCULAR | Status: DC | PRN

## 2018-10-03 MED ORDER — FENTANYL CITRATE (PF) 100 MCG/2ML IJ SOLN
INTRAMUSCULAR | Status: DC | PRN
  Administered 2018-10-03: 100 ug via INTRAVENOUS
  Administered 2018-10-03 (×2): 50 ug via INTRAVENOUS

## 2018-10-03 MED ORDER — MAGNESIUM CITRATE PO SOLN: 1.0000 | Freq: Once | ORAL | Status: DC | PRN

## 2018-10-03 MED ORDER — METHOCARBAMOL 500 MG PO TABS
500.0000 mg | ORAL_TABLET | Freq: Four times a day (QID) | ORAL | Status: DC | PRN
  Administered 2018-10-03: 500 mg via ORAL
  Filled 2018-10-03: qty 1

## 2018-10-03 MED ORDER — LEVOFLOXACIN 500 MG PO TABS
500.0000 mg | ORAL_TABLET | Freq: Every day | ORAL | Status: DC
  Administered 2018-10-03 – 2018-10-05 (×3): 500 mg via ORAL
  Filled 2018-10-03 (×5): qty 1

## 2018-10-03 MED ORDER — SCOPOLAMINE 1 MG/3DAYS TD PT72
MEDICATED_PATCH | TRANSDERMAL | Status: DC | PRN
  Administered 2018-10-03: 1 via TRANSDERMAL

## 2018-10-03 MED ORDER — DEXAMETHASONE SODIUM PHOSPHATE 10 MG/ML IJ SOLN
INTRAMUSCULAR | Status: DC | PRN
  Administered 2018-10-03: 4 mg via INTRAVENOUS

## 2018-10-03 MED ORDER — LACTATED RINGERS IV SOLN
INTRAVENOUS | Status: DC
  Administered 2018-10-03: 12:00:00 via INTRAVENOUS

## 2018-10-03 MED ORDER — ONDANSETRON HCL 4 MG PO TABS
4.0000 mg | ORAL_TABLET | Freq: Four times a day (QID) | ORAL | Status: DC | PRN
  Administered 2018-10-03: 4 mg via ORAL

## 2018-10-03 MED ORDER — METOCLOPRAMIDE HCL 5 MG/ML IJ SOLN: 5.0000 mg | Freq: Three times a day (TID) | INTRAMUSCULAR | Status: DC | PRN

## 2018-10-03 MED ORDER — BISACODYL 10 MG RE SUPP: 10.0000 mg | Freq: Every day | RECTAL | Status: DC | PRN

## 2018-10-03 MED ORDER — METOCLOPRAMIDE HCL 5 MG PO TABS: 5.0000 mg | ORAL_TABLET | Freq: Three times a day (TID) | ORAL | Status: DC | PRN

## 2018-10-03 MED ORDER — ONDANSETRON HCL 4 MG/2ML IJ SOLN
INTRAMUSCULAR | Status: DC | PRN
  Administered 2018-10-03: 4 mg via INTRAVENOUS

## 2018-10-03 MED ORDER — MIDAZOLAM HCL 2 MG/2ML IJ SOLN
INTRAMUSCULAR | Status: DC | PRN
  Administered 2018-10-03: 2 mg via INTRAVENOUS

## 2018-10-03 MED ORDER — FENTANYL CITRATE (PF) 100 MCG/2ML IJ SOLN
25.0000 ug | INTRAMUSCULAR | Status: DC | PRN
  Administered 2018-10-03 (×2): 50 ug via INTRAVENOUS

## 2018-10-03 MED ORDER — FENTANYL CITRATE (PF) 100 MCG/2ML IJ SOLN
INTRAMUSCULAR | Status: AC
  Administered 2018-10-03: 14:00:00 50 ug via INTRAVENOUS
  Filled 2018-10-03: qty 2

## 2018-10-03 MED ORDER — SODIUM CHLORIDE 0.9 % IV SOLN
INTRAVENOUS | Status: DC
  Administered 2018-10-03: 19:00:00 via INTRAVENOUS

## 2018-10-03 MED ORDER — 0.9 % SODIUM CHLORIDE (POUR BTL) OPTIME
TOPICAL | Status: DC | PRN
  Administered 2018-10-03: 3000 mL

## 2018-10-03 MED ORDER — POLYETHYLENE GLYCOL 3350 17 G PO PACK
17.0000 g | PACK | Freq: Every day | ORAL | Status: DC | PRN
  Administered 2018-10-04: 17 g via ORAL
  Filled 2018-10-03: qty 1

## 2018-10-03 MED ORDER — METHOCARBAMOL 1000 MG/10ML IJ SOLN
500.0000 mg | Freq: Four times a day (QID) | INTRAVENOUS | Status: DC | PRN
  Filled 2018-10-03: qty 5

## 2018-10-03 MED ORDER — ONDANSETRON HCL 4 MG/2ML IJ SOLN: 4.0000 mg | Freq: Once | INTRAMUSCULAR | Status: DC | PRN

## 2018-10-03 SURGICAL SUPPLY — 36 items
BLADE SURG 21 STRL SS (BLADE) ×2 IMPLANT
BNDG COHESIVE 6X5 TAN STRL LF (GAUZE/BANDAGES/DRESSINGS) IMPLANT
BNDG GAUZE ELAST 4 BULKY (GAUZE/BANDAGES/DRESSINGS) ×4 IMPLANT
CANISTER WOUNDNEG PRESSURE 500 (CANNISTER) ×1 IMPLANT
COVER SURGICAL LIGHT HANDLE (MISCELLANEOUS) ×4 IMPLANT
COVER WAND RF STERILE (DRAPES) ×2 IMPLANT
DRAPE U-SHAPE 47X51 STRL (DRAPES) ×2 IMPLANT
DRSG ADAPTIC 3X8 NADH LF (GAUZE/BANDAGES/DRESSINGS) ×2 IMPLANT
DRSG CLEANSE VERAFLOW (GAUZE/BANDAGES/DRESSINGS) ×1 IMPLANT
DURAPREP 26ML APPLICATOR (WOUND CARE) ×2 IMPLANT
ELECT REM PT RETURN 9FT ADLT (ELECTROSURGICAL)
ELECTRODE REM PT RTRN 9FT ADLT (ELECTROSURGICAL) IMPLANT
GAUZE SPONGE 4X4 12PLY STRL (GAUZE/BANDAGES/DRESSINGS) ×2 IMPLANT
GLOVE BIOGEL PI IND STRL 9 (GLOVE) ×1 IMPLANT
GLOVE BIOGEL PI INDICATOR 9 (GLOVE) ×1
GLOVE SURG ORTHO 9.0 STRL STRW (GLOVE) ×2 IMPLANT
GOWN STRL REUS W/ TWL XL LVL3 (GOWN DISPOSABLE) ×2 IMPLANT
GOWN STRL REUS W/TWL XL LVL3 (GOWN DISPOSABLE) ×4
GRAFT MESHED PRIMATRIX 10X12 (Graft) ×1 IMPLANT
HANDPIECE INTERPULSE COAX TIP (DISPOSABLE)
KIT BASIN OR (CUSTOM PROCEDURE TRAY) ×2 IMPLANT
KIT TURNOVER KIT B (KITS) ×2 IMPLANT
MANIFOLD NEPTUNE II (INSTRUMENTS) ×2 IMPLANT
MICROMATRIX 500MG (Tissue) ×2 IMPLANT
NS IRRIG 1000ML POUR BTL (IV SOLUTION) ×2 IMPLANT
PACK ORTHO EXTREMITY (CUSTOM PROCEDURE TRAY) ×2 IMPLANT
PAD ARMBOARD 7.5X6 YLW CONV (MISCELLANEOUS) ×4 IMPLANT
SET HNDPC FAN SPRY TIP SCT (DISPOSABLE) IMPLANT
SNARE SHORT THROW 13M SML OVAL (MISCELLANEOUS) ×1 IMPLANT
SOLUTION PARTIC MCRMTRX 500MG (Tissue) IMPLANT
STOCKINETTE IMPERVIOUS 9X36 MD (GAUZE/BANDAGES/DRESSINGS) IMPLANT
SWAB COLLECTION DEVICE MRSA (MISCELLANEOUS) ×2 IMPLANT
SWAB CULTURE ESWAB REG 1ML (MISCELLANEOUS) IMPLANT
TOWEL OR 17X26 10 PK STRL BLUE (TOWEL DISPOSABLE) ×2 IMPLANT
TUBE CONNECTING 12X1/4 (SUCTIONS) ×2 IMPLANT
YANKAUER SUCT BULB TIP NO VENT (SUCTIONS) ×2 IMPLANT

## 2018-10-03 NOTE — Anesthesia Procedure Notes (Signed)
Procedure Name: LMA Insertion Date/Time: 10/03/2018 1:19 PM Performed by: Leonor Liv, CRNA Pre-anesthesia Checklist: Patient identified, Emergency Drugs available, Suction available and Patient being monitored Patient Re-evaluated:Patient Re-evaluated prior to induction Oxygen Delivery Method: Circle System Utilized Preoxygenation: Pre-oxygenation with 100% oxygen Induction Type: IV induction Ventilation: Mask ventilation without difficulty LMA: LMA inserted LMA Size: 4.0 Number of attempts: 1 Airway Equipment and Method: Bite block Placement Confirmation: positive ETCO2 Tube secured with: Tape Dental Injury: Teeth and Oropharynx as per pre-operative assessment

## 2018-10-03 NOTE — Progress Notes (Signed)
[facility] for Infectious Disease  Date of Admission:  09/28/2018   Total days of antibiotics 6        Day three of Unasyn        Three prior days of cefazolin         ASSESSMENT: Wound being evaluated by Dr. Sharol Given today in the OR. Wound culture demonstrated a third species with serratia marcescens in addition to the staph caprae and proteus mirabilis. Could consider coverage with a third generation cephalosporin pending the evaluation of the surgical site unless improvement on current therapy.   PLAN: 1. 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 [MAR Hold] amLODipine  10 mg Oral Daily  . [MAR Hold] buPROPion  300 mg Oral Daily  . [MAR Hold] docusate sodium  100 mg Oral BID  . [MAR Hold] hydrochlorothiazide  25 mg Oral Daily  . [MAR Hold] multivitamin with minerals  1 tablet Oral Daily  . [MAR Hold] pantoprazole  40 mg Oral Daily  . povidone-iodine  2 application Topical Once  . [MAR Hold] pregabalin  200 mg Oral BID  . tranexamic acid (CYKLOKAPRON) topical -INTRAOP  2,000 mg Topical To OR   Continuous Infusions: . [MAR Hold] sodium chloride Stopped (10/01/18 1704)  . [MAR Hold] ampicillin-sulbactam (UNASYN) IV 3 g (10/03/18 7096)  . lactated ringers 10 mL/hr at 10/03/18 1145  . tranexamic acid     PRN Meds:.[MAR Hold] sodium chloride, [MAR Hold] acetaminophen, [MAR Hold] albuterol, [MAR Hold]  HYDROmorphone (DILAUDID) injection, [MAR Hold] metoCLOPramide **OR** [MAR Hold] metoCLOPramide (REGLAN) injection, [MAR Hold] ondansetron **OR** [MAR Hold] ondansetron (ZOFRAN) IV, [MAR Hold] oxyCODONE, [MAR Hold] oxyCODONE, [MAR Hold] tiZANidine   SUBJECTIVE: The patient stated that she  was feeling well today. She denied chest pain, headache or chills. She stated that she understood the plan. All questions were answered.   Review of Systems: Review of Systems  Constitutional: Negative for chills, diaphoresis and fever.  Respiratory: Negative for cough and shortness of breath.   Cardiovascular: Negative for chest pain and leg swelling.  Gastrointestinal: Negative for diarrhea, nausea and vomiting.  Genitourinary: Negative for flank pain and urgency.  Musculoskeletal:       Right leg pain.  Neurological: Negative for dizziness and headaches.    Allergies  Allergen Reactions  . Oxycodone Hcl Nausea And Vomiting  . Vicodin [Hydrocodone-Acetaminophen] Nausea And Vomiting    OBJECTIVE: Vitals:   10/02/18 1510 10/02/18 2010 10/03/18 0438 10/03/18 1133  BP: 116/64 135/78 (!) 122/57   Pulse: 94 (!) 43 (!) 101   Resp: 15 16 16    Temp: 98.4 F (36.9 C) 97.8 F (36.6 C)    TempSrc: Oral Oral    SpO2: 100% 100% 98%   Weight:    (!) 149 kg  Height:    5\' 6"  (1.676 m)   Body mass index is 53.02 kg/m.  Physical Exam Vitals signs reviewed.  Constitutional:      General: She is not in acute distress.    Appearance: She is well-developed. She is not diaphoretic.  HENT:     Head: Normocephalic and atraumatic.  Cardiovascular:  Rate and Rhythm: Normal rate and regular rhythm.     Heart sounds: No murmur.  Pulmonary:     Effort: Pulmonary effort is normal. No respiratory distress.     Breath sounds: Wheezing (Faint wheezing bilaterally) present.  Abdominal:     General: Bowel sounds are normal. There is no distension.     Palpations: Abdomen is soft.     Tenderness: There is no abdominal tenderness.  Musculoskeletal:        General: No tenderness.  Skin:    General: Skin is warm.     Capillary Refill: Capillary refill takes less than 2 seconds.  Neurological:     Mental Status: She is alert and oriented to person, place, and time.    cefdinir  Lab  Results Lab Results  Component Value Date   WBC 5.6 09/28/2018   HGB 10.6 (L) 09/28/2018   HCT 33.3 (L) 09/28/2018   MCV 76.6 (L) 09/28/2018   PLT 280 09/28/2018    Lab Results  Component Value Date   CREATININE 1.27 (H) 10/02/2018   BUN 26 (H) 09/28/2018   NA 138 09/28/2018   K 3.3 (L) 09/28/2018   CL 104 09/28/2018   CO2 24 09/28/2018    Lab Results  Component Value Date   ALT 11 01/29/2018   AST 26 01/29/2018   ALKPHOS 114 01/29/2018   BILITOT 0.3 01/29/2018     Microbiology: Recent Results (from the past 240 hour(s))  SARS Coronavirus 2 (CEPHEID - Performed in [facility] hospital lab), Hosp Order     Status: None   Collection Time: 09/26/18  2:40 PM  Result Value Ref Range Status   SARS Coronavirus 2 NEGATIVE NEGATIVE Final    Comment: (NOTE) If result is NEGATIVE SARS-CoV-2 target nucleic acids are NOT DETECTED. The SARS-CoV-2 RNA is generally detectable in upper and lower  respiratory specimens during the acute phase of infection. The lowest  concentration of SARS-CoV-2 viral copies this assay can detect is 250  copies / mL. A negative result does not preclude SARS-CoV-2 infection  and should not be used as the sole basis for treatment or other  patient management decisions.  A negative result may occur with  improper specimen collection / handling, submission of specimen other  than nasopharyngeal swab, presence of viral mutation(s) within the  areas targeted by this assay, and inadequate number of viral copies  (<250 copies / mL). A negative result must be combined with clinical  observations, patient history, and epidemiological information. If result is POSITIVE SARS-CoV-2 target nucleic acids are DETECTED. The SARS-CoV-2 RNA is generally detectable in upper and lower  respiratory specimens dur ing the acute phase of infection.  Positive  results are indicative of active infection with SARS-CoV-2.  Clinical  correlation with patient history and other  diagnostic information is  necessary to determine patient infection status.  Positive results do  not rule out bacterial infection or co-infection with other viruses. If result is PRESUMPTIVE POSTIVE SARS-CoV-2 nucleic acids MAY BE PRESENT.   A presumptive positive result was obtained on the submitted specimen  and confirmed on repeat testing.  While 2019 novel coronavirus  (SARS-CoV-2) nucleic acids may be present in the submitted sample  additional confirmatory testing may be necessary for epidemiological  and / or clinical management purposes  to differentiate between  SARS-CoV-2 and other Sarbecovirus currently known to infect humans.  If clinically indicated additional testing with an alternate test  methodology [phone]) is advised. The SARS-CoV-2 RNA  is generally  detectable in upper and lower respiratory sp ecimens during the acute  phase of infection. The expected result is Negative. Fact Sheet for Patients:  StrictlyIdeas.no Fact Sheet for Healthcare Providers: BankingDealers.co.za This test is not yet approved or cleared by the [place] FDA and has been authorized for detection and/or diagnosis of SARS-CoV-2 by FDA under an Emergency Use Authorization (EUA).  This EUA will remain in effect (meaning this test can be used) for the duration of the COVID-19 declaration under Section 564(b)(1) of the Act, 21 U.S.C. section 360bbb-3(b)(1), unless the authorization is terminated or revoked sooner. Performed at [facility], [place] [address], [place], [place] [address]   Aerobic/Anaerobic Culture (surgical/deep wound)     Status: None (Preliminary result)   Collection Time: 09/28/18  7:52 AM  Result Value Ref Range Status   Specimen Description TISSUE RIGHT LOWER LEG  Final   Special Requests NONE  Final   Gram Stain   Final    FEW WBC PRESENT, PREDOMINANTLY PMN FEW GRAM POSITIVE COCCI IN PAIRS FEW GRAM  POSITIVE RODS Performed at [facility], [place] [address] [place], [place] [address]    Culture   Final    MODERATE PROTEUS MIRABILIS MODERATE STAPHYLOCOCCUS CAPRAE MODERATE SERRATIA MARCESCENS    Report Status PENDING  Incomplete   Organism ID, Bacteria PROTEUS MIRABILIS  Final   Organism ID, Bacteria STAPHYLOCOCCUS CAPRAE  Final   Organism ID, Bacteria SERRATIA MARCESCENS  Final      Susceptibility   Staphylococcus caprae - MIC*    CIPROFLOXACIN <=0.5 SENSITIVE Sensitive     ERYTHROMYCIN <=0.25 SENSITIVE Sensitive     GENTAMICIN <=0.5 SENSITIVE Sensitive     OXACILLIN <=0.25 SENSITIVE Sensitive     TETRACYCLINE <=1 SENSITIVE Sensitive     VANCOMYCIN <=0.5 SENSITIVE Sensitive     TRIMETH/SULFA <=10 SENSITIVE Sensitive     CLINDAMYCIN <=0.25 SENSITIVE Sensitive     RIFAMPIN <=0.5 SENSITIVE Sensitive     Inducible Clindamycin NEGATIVE Sensitive     * MODERATE STAPHYLOCOCCUS CAPRAE   Proteus mirabilis - MIC*    AMPICILLIN <=2 SENSITIVE Sensitive     CEFAZOLIN <=4 SENSITIVE Sensitive     CEFEPIME <=1 SENSITIVE Sensitive     CEFTAZIDIME <=1 SENSITIVE Sensitive     CEFTRIAXONE <=1 SENSITIVE Sensitive     CIPROFLOXACIN <=0.25 SENSITIVE Sensitive     GENTAMICIN <=1 SENSITIVE Sensitive     IMIPENEM 2 SENSITIVE Sensitive     TRIMETH/SULFA <=20 SENSITIVE Sensitive     AMPICILLIN/SULBACTAM <=2 SENSITIVE Sensitive     PIP/TAZO <=4 SENSITIVE Sensitive     * MODERATE PROTEUS MIRABILIS   Serratia marcescens - MIC*    CEFAZOLIN >=64 RESISTANT Resistant     CEFEPIME <=1 SENSITIVE Sensitive     CEFTAZIDIME <=1 SENSITIVE Sensitive     CEFTRIAXONE <=1 SENSITIVE Sensitive     CIPROFLOXACIN <=0.25 SENSITIVE Sensitive     GENTAMICIN <=1 SENSITIVE Sensitive     TRIMETH/SULFA <=20 SENSITIVE Sensitive     * MODERATE SERRATIA MARCESCENS  Surgical pcr screen     Status: None   Collection Time: 09/30/18  3:09 PM  Result Value Ref Range Status   MRSA, PCR NEGATIVE NEGATIVE Final    Staphylococcus aureus NEGATIVE NEGATIVE Final    Comment: (NOTE) The Xpert SA Assay (FDA approved for NASAL specimens in patients 66 years of age and older), is one component of a comprehensive surveillance program. It is not intended to diagnose infection  nor to guide or monitor treatment. Performed at [facility], [place] [address] [place], [place] [address]     Kathi Ludwig, [facility] [phone] pager   [phone] cell 10/03/2018, 12:05 PM

## 2018-10-03 NOTE — Plan of Care (Signed)

## 2018-10-03 NOTE — Interval H&P Note (Signed)
History and Physical Interval Note:  10/03/2018 7:25 AM  Alice [name]  has presented today for surgery, with the diagnosis of Chronic Ulcer Right Lower Leg.  The various methods of treatment have been discussed with the patient and family. After consideration of risks, benefits and other options for treatment, the patient has consented to  Procedure(s): REPEAT IRRIGATION AND DEBRIDEMENT RIGHT LOWER LEG, VAC PLACEMENT (Right) as a surgical intervention.  The patient's history has been reviewed, patient examined, no change in status, stable for surgery.  I have reviewed the patient's chart and labs.  Questions were answered to the patient's satisfaction.     [name]

## 2018-10-03 NOTE — Anesthesia Preprocedure Evaluation (Signed)
Anesthesia Evaluation  Patient identified by MRN, date of birth, ID band Patient awake    Reviewed: Allergy & Precautions, NPO status , Patient's Chart, lab work & pertinent test results  Airway Mallampati: III  TM Distance: >3 FB Neck ROM: Full    Dental  (+) Partial Upper, Dental Advisory Given   Pulmonary sleep apnea and Continuous Positive Airway Pressure Ventilation , pneumonia, resolved,    Pulmonary exam normal breath sounds clear to auscultation       Cardiovascular hypertension, Pt. on medications + Peripheral Vascular Disease, +CHF and + DVT  Normal cardiovascular exam Rhythm:Regular Rate:Normal  LVEF 50-55% Hx/o DVT and PTE   Neuro/Psych  Headaches, PSYCHIATRIC DISORDERS Anxiety Depression  Neuromuscular disease    GI/Hepatic Neg liver ROS, GERD  Medicated and Controlled,  Endo/Other  Morbid obesity  Renal/GU Renal InsufficiencyRenal disease  negative genitourinary   Musculoskeletal  (+) Arthritis , Osteoarthritis,  Chronic ulcer right lower leg   Abdominal (+) + obese,   Peds  Hematology  (+) anemia , Anticoagulated on Coumadin - last dose   Anesthesia Other Findings   Reproductive/Obstetrics                             Anesthesia Physical  Anesthesia Plan  ASA: III  Anesthesia Plan: General   Post-op Pain Management:    Induction: Intravenous  PONV Risk Score and Plan: 4 or greater and Midazolam, Ondansetron, Treatment may vary due to age or medical condition, Dexamethasone and Scopolamine patch - Pre-op  Airway Management Planned: LMA  Additional Equipment:   Intra-op Plan:   Post-operative Plan: Extubation in OR  Informed Consent: I have reviewed the patients History and Physical, chart, labs and discussed the procedure including the risks, benefits and alternatives for the proposed anesthesia with the patient or authorized representative who has indicated  his/her understanding and acceptance.     Dental advisory given  Plan Discussed with: CRNA and Surgeon  Anesthesia Plan Comments:         Anesthesia Quick Evaluation

## 2018-10-03 NOTE — Transfer of Care (Signed)
Immediate Anesthesia Transfer of Care Note  Patient: Alice [name]  Procedure(s) Performed: REPEAT IRRIGATION AND DEBRIDEMENT RIGHT LOWER LEG, VAC PLACEMENT (Right )  Patient Location: PACU  Anesthesia Type:General  Level of Consciousness: awake, alert  and oriented  Airway & Oxygen Therapy: Patient Spontanous Breathing and Patient connected to nasal cannula oxygen  Post-op Assessment: Report given to RN, Post -op Vital signs reviewed and stable and Patient moving all extremities X 4  Post vital signs: Reviewed and stable  Last Vitals:  Vitals Value Taken Time  BP 158/105 10/03/2018  1:57 PM  Temp    Pulse 94 10/03/2018  2:03 PM  Resp 10 10/03/2018  2:03 PM  SpO2 100 % 10/03/2018  2:03 PM  Vitals shown include unvalidated device data.  Last Pain:  Vitals:   10/03/18 1357  TempSrc:   PainSc: 10-Worst pain ever      Patients Stated Pain Goal: 3 (11/94/17 4081)  Complications: No apparent anesthesia complications

## 2018-10-03 NOTE — Op Note (Signed)
10/03/2018  2:16 PM  PATIENT:  Alice [name]    PRE-OPERATIVE DIAGNOSIS:  Chronic Ulcer Right Lower Leg  POST-OPERATIVE DIAGNOSIS:  Same  PROCEDURE:  REPEAT IRRIGATION AND DEBRIDEMENT RIGHT LOWER LEG, wound bed preparation for skin graft, VAC PLACEMENT  SURGEON:  [name], MD  PHYSICIAN ASSISTANT:None ANESTHESIA:   General  PREOPERATIVE INDICATIONS:  [name] is a  48 y.o. female with a diagnosis of Chronic Ulcer Right Lower Leg who failed conservative measures and elected for surgical management.    The risks benefits and alternatives were discussed with the patient preoperatively including but not limited to the risks of infection, bleeding, nerve injury, cardiopulmonary complications, the need for revision surgery, among others, and the patient was willing to proceed.  OPERATIVE IMPLANTS: Integra dermal graft 10 x 12 cm. 500 mg ACell powder.  @ENCIMAGES @  OPERATIVE FINDINGS: Healthy granulation tissue bed  OPERATIVE PROCEDURE: Patient was brought the operating room and underwent a general anesthetic.  After adequate levels anesthesia were obtained patient's right lower extremity was prepped using Betadine paint draped into a sterile field a timeout was called.  Local wound debridement was performed to prepare the wound bed for a split-thickness skin graft.  Patient had excellent improvement of the wound bed using the cleanse choice dressing.  There is no redness no cellulitis there is significant decrease swelling.  Patient had 400 cc in the wound VAC canister.  21 blade knife and rondure were used to complete the debridement.  The patient received TXA preoperatively.  The split-thickness skin graft that was meshed was stretched to cover the wound that was 10 x 15 cm.  This was stapled in place.  Beneath the Integra graft was 500 mg of ACell powder.  This provided good hemostasis.  The wound VAC had a good suction fit patient was extubated taken the PACU in stable  condition.   DISCHARGE PLANNING:  Antibiotic duration: Continue IV antibiotics  Weightbearing: Weightbearing as tolerated  Pain medication: Continue opioid pathway  Dressing care/ Wound VAC: Wound VAC to remain in place without change  Ambulatory devices: Walker  Discharge to: Anticipate patient may discharge to home in several days once the drainage has resolved.  Follow-up: In the office 1 week post operative.

## 2018-10-03 NOTE — Anesthesia Postprocedure Evaluation (Signed)
Anesthesia Post Note  Patient: Alice [name]  Procedure(s) Performed: REPEAT IRRIGATION AND DEBRIDEMENT RIGHT LOWER LEG, VAC PLACEMENT (Right )     Patient location during evaluation: PACU Anesthesia Type: General Level of consciousness: awake and alert and oriented Pain management: pain level controlled Vital Signs Assessment: post-procedure vital signs reviewed and stable Respiratory status: spontaneous breathing, nonlabored ventilation and respiratory function stable Cardiovascular status: blood pressure returned to baseline and stable Postop Assessment: no apparent nausea or vomiting Anesthetic complications: no    Last Vitals:  Vitals:   10/03/18 1445 10/03/18 1500  BP: (!) 155/91 (!) 152/97  Pulse: 90 90  Resp: 12 18  Temp:  36.5 C  SpO2: 100% 99%    Last Pain:  Vitals:   10/03/18 1440  TempSrc:   PainSc: Asleep                 Krystie Leiter A.

## 2018-10-04 ENCOUNTER — Encounter (HOSPITAL_COMMUNITY): Payer: Self-pay | Admitting: Orthopedic Surgery

## 2018-10-04 LAB — PROTIME-INR
INR: 1.2 (ref 0.8–1.2)
Prothrombin Time: 14.8 seconds (ref 11.4–15.2)

## 2018-10-04 MED ORDER — ENSURE MAX PROTEIN PO LIQD
11.0000 [oz_av] | Freq: Two times a day (BID) | ORAL | Status: DC
  Administered 2018-10-05: 11 [oz_av] via ORAL
  Filled 2018-10-04 (×4): qty 330

## 2018-10-04 NOTE — Progress Notes (Signed)
Rehab Admissions Coordinator Note:  Patient was screened by Michel Santee, PT, DPT for appropriateness for an Inpatient Acute Rehab Consult.  At this time, note pt for plans to OR for possible skingraft on Friday, 10/05/2018.  Will continue to follow from a distance and await PT/OT notes following procedure to determine most appropriate post acute venue of care.   Shann Medal, PT, DPT Admissions Coordinator [phone] 10/04/18  11:20 AM

## 2018-10-04 NOTE — Care Management Important Message (Signed)
Important Message  Patient Details  Name: Alice [name] MRN: [id number] Date of Birth: May 04, 1971   Medicare Important Message Given:  Yes    Karol Skarzynski 10/04/2018, 2:20 PM

## 2018-10-04 NOTE — Progress Notes (Addendum)
[facility] for Infectious Disease  Date of Admission:  09/28/2018   Total days of antibiotics 6        Day 2 levofloxacin        Unasyn 05/18-->05/20        ASSESSMENT: Wound greatly improved as per orthopedic operative note with prominent granulation tissue absent signs or worsening local infection. As such, she is likely to respond well to a minimal course of continued antibiotics. Given the susceptibility pattern to the species isolated thus far a flouroquinolone appears appropriate. Would continue for one additional day. Last day of antibiotics 10/05/18.  Please notify us if the patient worsens or fails to continue improving on current therapy.   PLAN: 1. 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 amLODipine  10 mg Oral Daily  . buPROPion  300 mg Oral Daily  . docusate sodium  100 mg Oral BID  . hydrochlorothiazide  25 mg Oral Daily  . levofloxacin  500 mg Oral Daily  . multivitamin with minerals  1 tablet Oral Daily  . pantoprazole  40 mg Oral Daily  . pregabalin  200 mg Oral BID   Continuous Infusions: . sodium chloride Stopped (10/01/18 1704)  . sodium chloride 10 mL/hr at 10/04/18 0500  . lactated ringers 10 mL/hr at 10/03/18 1145  . methocarbamol (ROBAXIN) IV     PRN Meds:.sodium chloride, acetaminophen, albuterol, bisacodyl, HYDROmorphone (DILAUDID) injection, magnesium citrate, methocarbamol **OR** methocarbamol (ROBAXIN) IV, metoCLOPramide **OR** metoCLOPramide (REGLAN) injection, ondansetron **OR** ondansetron (ZOFRAN) IV, ondansetron **OR** ondansetron (ZOFRAN) IV, oxyCODONE, oxyCODONE, polyethylene glycol, tiZANidine   SUBJECTIVE: The patient  stated that she felt well this am. She endorses pain of the surgical area but this is tolerable with her current analgesic regimen. She denied acute concerns and understood that the wound appeared improved.   Review of Systems: Review of Systems  Constitutional: Negative for chills, diaphoresis and fever.  Respiratory: Negative for cough and shortness of breath.   Cardiovascular: Negative for chest pain and leg swelling.  Gastrointestinal: Negative for constipation, diarrhea, nausea and vomiting.  Genitourinary: Negative for flank pain and urgency.  Musculoskeletal:       Pain is worse post surgically but has not taken her analgesic this am  Neurological: Negative for dizziness and headaches.  Psychiatric/Behavioral: Negative for depression. The patient is not nervous/anxious.     Allergies  Allergen Reactions  . Oxycodone Hcl Nausea And Vomiting  . Vicodin [Hydrocodone-Acetaminophen] Nausea And Vomiting    OBJECTIVE: Vitals:   10/03/18 1500 10/03/18 1936 10/03/18 2342 10/04/18 0400  BP: (!) 152/97 122/71 121/69 125/70  Pulse: 90 99 93 87  Resp: 18 18 15 14   Temp: 97.7 F (36.5 C) 98.6 F (37 C) 98.5 F (36.9 C) 98.3 F (36.8 C)  TempSrc:  Oral Oral Oral  SpO2: 99% 96% 100% 98%  Weight:      Height:       Body mass index is 53.02 kg/m.  Physical Exam Vitals signs reviewed.  Constitutional:      General: She is not in acute distress.    Appearance: She is well-developed. She is not diaphoretic.  Cardiovascular:     Rate and Rhythm: Normal rate and regular rhythm.     Heart sounds: No murmur.  Pulmonary:     Effort: Pulmonary effort is normal.     Breath sounds: No stridor. Wheezing (Faint, RLL) present.  Abdominal:     General: Bowel sounds are normal. There is no distension.     Palpations: Abdomen is soft.     Tenderness: There is no abdominal tenderness.  Musculoskeletal:        General: No tenderness.  Skin:    General: Skin is warm.     Capillary Refill:  Capillary refill takes less than 2 seconds.  Neurological:     Mental Status: She is alert and oriented to person, place, and time.     Lab Results Lab Results  Component Value Date   WBC 5.6 09/28/2018   HGB 10.6 (L) 09/28/2018   HCT 33.3 (L) 09/28/2018   MCV 76.6 (L) 09/28/2018   PLT 280 09/28/2018    Lab Results  Component Value Date   CREATININE 1.27 (H) 10/02/2018   BUN 26 (H) 09/28/2018   NA 138 09/28/2018   K 3.3 (L) 09/28/2018   CL 104 09/28/2018   CO2 24 09/28/2018    Lab Results  Component Value Date   ALT 11 01/29/2018   AST 26 01/29/2018   ALKPHOS 114 01/29/2018   BILITOT 0.3 01/29/2018     Microbiology: Recent Results (from the past 240 hour(s))  SARS Coronavirus 2 (CEPHEID - Performed in [place] hospital lab), Hosp Order     Status: None   Collection Time: 09/26/18  2:40 PM  Result Value Ref Range Status   SARS Coronavirus 2 NEGATIVE NEGATIVE Final    Comment: (NOTE) If result is NEGATIVE SARS-CoV-2 target nucleic acids are NOT DETECTED. The SARS-CoV-2 RNA is generally detectable in upper and lower  respiratory specimens during the acute phase of infection. The lowest  concentration of SARS-CoV-2 viral copies this assay can detect is 250  copies / mL. A negative result does not preclude SARS-CoV-2 infection  and should not be used as the sole basis for treatment or other  patient management decisions.  A negative result may occur with  improper specimen collection / handling, submission of specimen other  than nasopharyngeal swab, presence of viral mutation(s) within the  areas targeted by this assay, and inadequate number of viral copies  (<250 copies / mL). A negative result must be combined with clinical  observations, patient history, and epidemiological information. If result is POSITIVE SARS-CoV-2 target nucleic acids are DETECTED. The SARS-CoV-2 RNA is generally detectable in upper and lower  respiratory specimens dur ing the acute  phase of infection.  Positive  results are indicative of active infection with SARS-CoV-2.  Clinical  correlation with patient history and other diagnostic information is  necessary to determine patient infection status.  Positive results do  not rule out bacterial infection or co-infection with other viruses. If result is PRESUMPTIVE POSTIVE SARS-CoV-2 nucleic acids MAY BE PRESENT.   A presumptive positive result was obtained on the submitted specimen  and confirmed on repeat testing.  While 2019 novel coronavirus  (SARS-CoV-2) nucleic acids may be present in the submitted sample  additional confirmatory testing may be necessary for epidemiological  and / or clinical management purposes  to differentiate between  SARS-CoV-2 and other Sarbecovirus currently known to infect humans.  If clinically indicated additional testing with an alternate test  methodology [phone]) is advised. The  SARS-CoV-2 RNA is generally  detectable in upper and lower respiratory sp ecimens during the acute  phase of infection. The expected result is Negative. Fact Sheet for Patients:  StrictlyIdeas.no Fact Sheet for Healthcare Providers: BankingDealers.co.za This test is not yet approved or cleared by the [place] FDA and has been authorized for detection and/or diagnosis of SARS-CoV-2 by FDA under an Emergency Use Authorization (EUA).  This EUA will remain in effect (meaning this test can be used) for the duration of the COVID-19 declaration under Section 564(b)(1) of the Act, 21 U.S.C. section 360bbb-3(b)(1), unless the authorization is terminated or revoked sooner. Performed at [facility], [place] [employer] [address] [place], [place] [address]   Aerobic/Anaerobic Culture (surgical/deep wound)     Status: None   Collection Time: 09/28/18  7:52 AM  Result Value Ref Range Status   Specimen Description TISSUE RIGHT LOWER LEG  Final   Special  Requests NONE  Final   Gram Stain   Final    FEW WBC PRESENT, PREDOMINANTLY PMN FEW GRAM POSITIVE COCCI IN PAIRS FEW GRAM POSITIVE RODS    Culture   Final    MODERATE PROTEUS MIRABILIS MODERATE STAPHYLOCOCCUS CAPRAE MODERATE SERRATIA MARCESCENS NO ANAEROBES ISOLATED Performed at [facility], [place] [address], [place], [place] [address]    Report Status 10/03/2018 FINAL  Final   Organism ID, Bacteria PROTEUS MIRABILIS  Final   Organism ID, Bacteria STAPHYLOCOCCUS CAPRAE  Final   Organism ID, Bacteria SERRATIA MARCESCENS  Final      Susceptibility   Staphylococcus caprae - MIC*    CIPROFLOXACIN <=0.5 SENSITIVE Sensitive     ERYTHROMYCIN <=0.25 SENSITIVE Sensitive     GENTAMICIN <=0.5 SENSITIVE Sensitive     OXACILLIN <=0.25 SENSITIVE Sensitive     TETRACYCLINE <=1 SENSITIVE Sensitive     VANCOMYCIN <=0.5 SENSITIVE Sensitive     TRIMETH/SULFA <=10 SENSITIVE Sensitive     CLINDAMYCIN <=0.25 SENSITIVE Sensitive     RIFAMPIN <=0.5 SENSITIVE Sensitive     Inducible Clindamycin NEGATIVE Sensitive     * MODERATE STAPHYLOCOCCUS CAPRAE   Proteus mirabilis - MIC*    AMPICILLIN <=2 SENSITIVE Sensitive     CEFAZOLIN <=4 SENSITIVE Sensitive     CEFEPIME <=1 SENSITIVE Sensitive     CEFTAZIDIME <=1 SENSITIVE Sensitive     CEFTRIAXONE <=1 SENSITIVE Sensitive     CIPROFLOXACIN <=0.25 SENSITIVE Sensitive     GENTAMICIN <=1 SENSITIVE Sensitive     IMIPENEM 2 SENSITIVE Sensitive     TRIMETH/SULFA <=20 SENSITIVE Sensitive     AMPICILLIN/SULBACTAM <=2 SENSITIVE Sensitive     PIP/TAZO <=4 SENSITIVE Sensitive     * MODERATE PROTEUS MIRABILIS   Serratia marcescens - MIC*    CEFAZOLIN >=64 RESISTANT Resistant     CEFEPIME <=1 SENSITIVE Sensitive     CEFTAZIDIME <=1 SENSITIVE Sensitive     CEFTRIAXONE <=1 SENSITIVE Sensitive     CIPROFLOXACIN <=0.25 SENSITIVE Sensitive     GENTAMICIN <=1 SENSITIVE Sensitive     TRIMETH/SULFA <=20 SENSITIVE Sensitive     * MODERATE SERRATIA MARCESCENS   Surgical pcr screen     Status: None   Collection Time: 09/30/18  3:09 PM  Result Value Ref Range Status   MRSA, PCR NEGATIVE NEGATIVE Final   Staphylococcus aureus NEGATIVE NEGATIVE Final    Comment: (NOTE) The Xpert SA Assay (FDA approved for NASAL specimens in patients 53 years of age and older), is one component of a comprehensive surveillance program. It is not  intended to diagnose infection nor to guide or monitor treatment. Performed at [facility], [place] [address] [place], [place] [address]     Kathi Ludwig, [place] [facility] [phone] pager   [phone] cell 10/04/2018, 7:56 AM

## 2018-10-04 NOTE — Evaluation (Addendum)
Physical Therapy Evaluation Patient Details Name: Alice [name] MRN: [id number] DOB: May 04, 1971 Today's Date: 10/04/2018   History of Present Illness  Pt is a 48 y.o. female admitted 09/28/18 with chronic RLE ulcer. S/p I&D 5/15 and 5/20 with skin graft and wound vac placement. PMH includes wears glasses/dentures, DVT/PE, morbid obesity, HTN, chronic back pain, arthritis.    Clinical Impression  Pt presents with an overall decrease in functional mobility secondary to above. PTA, pt indep and lives alone; sister can provide 24/7 support at d/c. Today, pt required modA to pivot to recliner; unable to achieve fully upright standing due to significant RLE pain with WB. Of note, patient has not been out of bed since admission 09/28/18; when asked, reports she was afraid of the pain. Max education on surgery/skin graft and importance of RLE ROM/mobility. Pt would benefit from continued acute PT services to maximize functional mobility and independence prior to d/c with CIR-level thearpies.   Pt reports w/c will not fit into her home, will need to be able to ambulate mod indep with DME in order to d/c home safely.     Follow Up Recommendations CIR;Supervision for mobility/OOB    Equipment Recommendations  Rolling walker with 5" wheels(reports w/c will not fit into apartment)    Recommendations for Other Services       Precautions / Restrictions Precautions Precautions: Fall Precaution Comments: RLE wound vac Restrictions Weight Bearing Restrictions: Yes RLE Weight Bearing: Weight bearing as tolerated      Mobility  Bed Mobility Overal bed mobility: Needs Assistance Bed Mobility: Supine to Sit     Supine to sit: Supervision;HOB elevated     General bed mobility comments: Use of bed rail; increased time and effort. Painful RLE throughout  Transfers Overall transfer level: Needs assistance Equipment used: Rolling walker (2 wheeled) Transfers: Sit to/from Stand           General  transfer comment: Reliant on modA to assist trunk elevation; PT had prepared pt to stand, but pt ultimately performing squat pivot with one hand on RW and other reached to recliner armrest. Limited by significant RLE pain when attempting to WB  Ambulation/Gait             General Gait Details: Unable due to pain  Stairs            Wheelchair Mobility    Modified Rankin (Stroke Patients Only)       Balance Overall balance assessment: Needs assistance   Sitting balance-Leahy Scale: Fair       Standing balance-Leahy Scale: Poor Standing balance comment: Unable to achieve fully upright standing today due to pain                             Pertinent Vitals/Pain Pain Assessment: Faces Faces Pain Scale: Hurts whole lot Pain Location: RLE Pain Descriptors / Indicators: Sore;Grimacing;Guarding Pain Intervention(s): Limited activity within patient's tolerance;Patient requesting pain meds-RN notified;Repositioned    Home Living Family/patient expects to be discharged to:: Private residence Living Arrangements: Alone Available Help at Discharge: Family;Available 24 hours/day(sister can stay) Type of Home: Apartment Home Access: Stairs to enter Entrance Stairs-Rails: Left Entrance Stairs-Number of Steps: 3 Home Layout: One level Home Equipment: None Additional Comments: Reports RW will fit into apartment, but w/c will not    Prior Function Level of Independence: Independent         Comments: Not forthcoming with details regarding PLOF when asked  about work/hobbies, but also very distracted by pain during evaluation     Hand Dominance        Extremity/Trunk Assessment   Upper Extremity Assessment Upper Extremity Assessment: Overall WFL for tasks assessed    Lower Extremity Assessment Lower Extremity Assessment: RLE deficits/detail RLE Deficits / Details: s/p R lower leg I&D; hip flex at least 3/5 RLE: Unable to fully assess due to pain RLE  Coordination: decreased gross motor       Communication   Communication: No difficulties  Cognition Arousal/Alertness: Awake/alert Behavior During Therapy: WFL for tasks assessed/performed Overall Cognitive Status: Within Functional Limits for tasks assessed                                 General Comments: WFL although distracted by pain       General Comments      Exercises Other Exercises Other Exercises: Pt declined attempt at heel cord/calf stretch due to pain   Assessment/Plan    PT Assessment Patient needs continued PT services  PT Problem List Decreased strength;Decreased range of motion;Decreased activity tolerance;Decreased balance;Decreased mobility;Decreased knowledge of use of DME;Decreased knowledge of precautions;Pain       PT Treatment Interventions DME instruction;Gait training;Stair training;Functional mobility training;Therapeutic activities;Therapeutic exercise;Balance training;Patient/family education    PT Goals (Current goals can be found in the Care Plan section)  Acute Rehab PT Goals Patient Stated Goal: Not sure about rehab "they'll keep me too long;" pain medicine PT Goal Formulation: With patient Time For Goal Achievement: 10/18/18 Potential to Achieve Goals: Good    Frequency Min 5X/week   Barriers to discharge        Co-evaluation               AM-PAC PT "6 Clicks" Mobility  Outcome Measure Help needed turning from your back to your side while in a flat bed without using bedrails?: None Help needed moving from lying on your back to sitting on the side of a flat bed without using bedrails?: None Help needed moving to and from a bed to a chair (including a wheelchair)?: A Lot Help needed standing up from a chair using your arms (e.g., wheelchair or bedside chair)?: A Lot Help needed to walk in hospital room?: A Lot Help needed climbing 3-5 steps with a railing? : Total 6 Click Score: 15    End of Session Equipment  Utilized During Treatment: Gait belt Activity Tolerance: Patient limited by pain Patient left: in chair;with call bell/phone within reach;with chair alarm set Nurse Communication: Mobility status PT Visit Diagnosis: Other abnormalities of gait and mobility (R26.89);Pain Pain - Right/Left: Right Pain - part of body: Leg;Ankle and joints of foot    Time: 5681-2751 PT Time Calculation (min) (ACUTE ONLY): 26 min   Charges:   PT Evaluation $PT Eval Moderate Complexity: 1 Mod PT Treatments $Therapeutic Activity: 8-22 mins   Mabeline Caras, PT, DPT Acute Rehabilitation Services  Pager [phone] Office [place] 10/04/2018, 10:12 AM

## 2018-10-04 NOTE — TOC Progression Note (Signed)
Transition of Care [facility]) - Progression Note    Patient Details  Name: Alice [name] MRN: [id number] Date of Birth: 29-Apr-1971  [facility]) CM/SW Contact  Jacalyn Lefevre Edson Snowball, RN Phone Number: 10/04/2018, 12:25 PM  Clinical Narrative:     Patient hoping to be accepted to inpatient rehab at [facility]. Patient from home alone. Patient will discuss with family members and friends if they can stay with her after discharge.  Expected Discharge Plan: [place] Barriers to Discharge: Continued Medical Work up  Expected Discharge Plan and Services Expected Discharge Plan: [place]   Discharge Planning Services: CM Consult   Living arrangements for the past 2 months: Apartment                   DME Agency: NA                   Social Determinants of Health (SDOH) Interventions    Readmission Risk Interventions No flowsheet data found.

## 2018-10-04 NOTE — Progress Notes (Addendum)
Initial Nutrition Assessment  RD working remotely.  DOCUMENTATION CODES:   Morbid obesity  INTERVENTION:   -Continue MVI with minerals daily -Ensure Max po BID, each supplement provides 150 kcal and 30 grams of protein  NUTRITION DIAGNOSIS:   Increased nutrient needs related to post-op healing, wound healing as evidenced by estimated needs.  GOAL:   Patient will meet greater than or equal to 90% of their needs  MONITOR:   PO intake, Supplement acceptance, Labs, Weight trends, Skin, I & O's  REASON FOR ASSESSMENT:   LOS    ASSESSMENT:   The patient is a 48 year old woman who has had a chronic ulceration over her right lower extremity ongoing for more than a year.  Multiple modalities and treatments have been attempted for healing of her chronic venous insufficiency ulcer but have been unsuccessful.  The patient has now developed significant biofilm and increased epiboly around the wound edges and presents for surgical intervention with plans for debridement, excision of biofilm, placement of installation wound VAC.  Plan staged procedures with possible split thickness skin graft once the wound is sufficiently clean for placement of a graft.  Pt admitted with chronic ulceration of rt lower extremity due to chronic venous insufficiency.   5/15- s/p PROCEDURE:  DEBRIDEMENT RIGHT LOWER LEG WITH PLACEMENT OF SKIN GRAFT AND VAC Excision of skin soft tissue muscle and fascia with a 21 blade knife. 5/20- s/p PROCEDURE:  REPEAT IRRIGATION AND DEBRIDEMENT RIGHT LOWER LEG, wound bed preparation for skin graft, VAC PLACEMENT  Reviewed I/O's: -1.5 L x 24 hours and -7.7 L since admission  UOP: 3 L x 24 hours  Drains: 0 ml x 24 hours  Per orthopedics notes, plan for possible OR tomorrow (10/05/18) for skin graft.   Pt with good appetite; noted meal completion 50-100%. Wt has been stable.   Noted pt with no documented BM since 09/26/18. Colace added on 09/28/18.   Pt with increased  nutritional needs and would benefit from addition of nutritional supplements.   Per therapy notes, recommending CIR.   Labs reviewed.   NUTRITION - FOCUSED PHYSICAL EXAM:    Most Recent Value  Orbital Region  Unable to assess  Upper Arm Region  Unable to assess  Thoracic and Lumbar Region  Unable to assess  Buccal Region  Unable to assess  Temple Region  Unable to assess  Clavicle Bone Region  Unable to assess  Clavicle and Acromion Bone Region  Unable to assess  Scapular Bone Region  Unable to assess  Dorsal Hand  Unable to assess  Patellar Region  Unable to assess  Anterior Thigh Region  Unable to assess  Posterior Calf Region  Unable to assess  Edema (RD Assessment)  Unable to assess  Hair  Unable to assess  Eyes  Unable to assess  Mouth  Unable to assess  Skin  Unable to assess  Nails  Unable to assess       Diet Order:   Diet Order            Diet Carb Modified Fluid consistency: Thin; Room service appropriate? Yes  Diet effective now              EDUCATION NEEDS:   No education needs have been identified at this time  Skin:  Skin Assessment: Skin Integrity Issues: Skin Integrity Issues:: Wound VAC Wound Vac: rt leg  Last BM:  09/26/18  Height:   Ht Readings from Last 1 Encounters:  10/03/18 5\' 6"  (1.676  m)    Weight:   Wt Readings from Last 1 Encounters:  10/03/18 (!) 149 kg    Ideal Body Weight:  59.1 kg  BMI:  Body mass index is 53.02 kg/m.  Estimated Nutritional Needs:   Kcal:  1850-2050  Protein:  115-130 grams  Fluid:  > 1.8 L    Shuntae Herzig A. Jimmye Norman, RD, LDN, [place] Registered Dietitian II Certified Diabetes Care and Education Specialist Pager: [phone] After hours Pager: [phone]

## 2018-10-05 ENCOUNTER — Inpatient Hospital Stay (HOSPITAL_COMMUNITY)
Admission: RE | Admit: 2018-10-05 | Discharge: 2018-10-14 | DRG: 945 | Disposition: A | Discharge status: Home Health | Payer: Medicare Other | Attending: Physical Medicine & Rehabilitation | Admitting: Physical Medicine & Rehabilitation

## 2018-10-05 ENCOUNTER — Encounter (HOSPITAL_COMMUNITY): Payer: Self-pay

## 2018-10-05 ENCOUNTER — Telehealth: Payer: Self-pay | Admitting: Orthopedic Surgery

## 2018-10-05 ENCOUNTER — Other Ambulatory Visit: Payer: Self-pay

## 2018-10-05 ENCOUNTER — Inpatient Hospital Stay: Payer: Self-pay

## 2018-10-05 DIAGNOSIS — I739 Peripheral vascular disease, unspecified: Secondary | ICD-10-CM | POA: Diagnosis present

## 2018-10-05 DIAGNOSIS — R5381 Other malaise: Secondary | ICD-10-CM | POA: Diagnosis present

## 2018-10-05 DIAGNOSIS — Z86718 Personal history of other venous thrombosis and embolism: Secondary | ICD-10-CM | POA: Diagnosis not present

## 2018-10-05 DIAGNOSIS — K219 Gastro-esophageal reflux disease without esophagitis: Secondary | ICD-10-CM | POA: Diagnosis present

## 2018-10-05 DIAGNOSIS — E8809 Other disorders of plasma-protein metabolism, not elsewhere classified: Secondary | ICD-10-CM

## 2018-10-05 DIAGNOSIS — G8918 Other acute postprocedural pain: Secondary | ICD-10-CM

## 2018-10-05 DIAGNOSIS — F329 Major depressive disorder, single episode, unspecified: Secondary | ICD-10-CM | POA: Diagnosis present

## 2018-10-05 DIAGNOSIS — D62 Acute posthemorrhagic anemia: Secondary | ICD-10-CM

## 2018-10-05 DIAGNOSIS — R791 Abnormal coagulation profile: Secondary | ICD-10-CM | POA: Diagnosis present

## 2018-10-05 DIAGNOSIS — R0989 Other specified symptoms and signs involving the circulatory and respiratory systems: Secondary | ICD-10-CM

## 2018-10-05 DIAGNOSIS — I10 Essential (primary) hypertension: Secondary | ICD-10-CM

## 2018-10-05 DIAGNOSIS — F419 Anxiety disorder, unspecified: Secondary | ICD-10-CM | POA: Diagnosis present

## 2018-10-05 DIAGNOSIS — D509 Iron deficiency anemia, unspecified: Secondary | ICD-10-CM | POA: Diagnosis present

## 2018-10-05 DIAGNOSIS — I129 Hypertensive chronic kidney disease with stage 1 through stage 4 chronic kidney disease, or unspecified chronic kidney disease: Secondary | ICD-10-CM | POA: Diagnosis present

## 2018-10-05 DIAGNOSIS — N182 Chronic kidney disease, stage 2 (mild): Secondary | ICD-10-CM | POA: Diagnosis present

## 2018-10-05 DIAGNOSIS — G4733 Obstructive sleep apnea (adult) (pediatric): Secondary | ICD-10-CM | POA: Diagnosis present

## 2018-10-05 DIAGNOSIS — Z86711 Personal history of pulmonary embolism: Secondary | ICD-10-CM | POA: Diagnosis not present

## 2018-10-05 DIAGNOSIS — K5900 Constipation, unspecified: Secondary | ICD-10-CM | POA: Diagnosis present

## 2018-10-05 DIAGNOSIS — Z6841 Body Mass Index (BMI) 40.0 and over, adult: Secondary | ICD-10-CM | POA: Diagnosis not present

## 2018-10-05 DIAGNOSIS — N179 Acute kidney failure, unspecified: Secondary | ICD-10-CM | POA: Diagnosis present

## 2018-10-05 DIAGNOSIS — Z7901 Long term (current) use of anticoagulants: Secondary | ICD-10-CM

## 2018-10-05 DIAGNOSIS — Z9119 Patient's noncompliance with other medical treatment and regimen: Secondary | ICD-10-CM | POA: Diagnosis not present

## 2018-10-05 DIAGNOSIS — S81801S Unspecified open wound, right lower leg, sequela: Secondary | ICD-10-CM

## 2018-10-05 DIAGNOSIS — N189 Chronic kidney disease, unspecified: Secondary | ICD-10-CM | POA: Insufficient documentation

## 2018-10-05 DIAGNOSIS — R5383 Other fatigue: Secondary | ICD-10-CM | POA: Diagnosis present

## 2018-10-05 LAB — URINALYSIS, ROUTINE W REFLEX MICROSCOPIC
Bilirubin Urine: NEGATIVE
Glucose, UA: NEGATIVE mg/dL
Ketones, ur: NEGATIVE mg/dL
Nitrite: NEGATIVE
Protein, ur: NEGATIVE mg/dL
Specific Gravity, Urine: 1.011 (ref 1.005–1.030)
pH: 6 (ref 5.0–8.0)

## 2018-10-05 LAB — PROTIME-INR
INR: 1.2 (ref 0.8–1.2)
Prothrombin Time: 15 seconds (ref 11.4–15.2)

## 2018-10-05 MED ORDER — POLYETHYLENE GLYCOL 3350 17 G PO PACK
17.0000 g | PACK | Freq: Every day | ORAL | Status: DC | PRN
  Administered 2018-10-11: 17 g via ORAL
  Filled 2018-10-05: qty 1

## 2018-10-05 MED ORDER — OXYCODONE HCL 5 MG PO TABS
10.0000 mg | ORAL_TABLET | ORAL | Status: DC | PRN
  Administered 2018-10-05 – 2018-10-06 (×4): 15 mg via ORAL
  Administered 2018-10-06: 10 mg via ORAL
  Administered 2018-10-06: 15 mg via ORAL
  Administered 2018-10-06: 5 mg via ORAL
  Administered 2018-10-06 – 2018-10-12 (×21): 15 mg via ORAL
  Filled 2018-10-05 (×27): qty 3
  Filled 2018-10-05: qty 2

## 2018-10-05 MED ORDER — ENSURE MAX PROTEIN PO LIQD
11.0000 [oz_av] | Freq: Two times a day (BID) | ORAL | Status: DC
  Administered 2018-10-06 – 2018-10-12 (×9): 11 [oz_av] via ORAL
  Filled 2018-10-05 (×19): qty 330

## 2018-10-05 MED ORDER — DOCUSATE SODIUM 100 MG PO CAPS
100.0000 mg | ORAL_CAPSULE | Freq: Two times a day (BID) | ORAL | Status: DC
  Administered 2018-10-05 – 2018-10-14 (×17): 100 mg via ORAL
  Filled 2018-10-05 (×18): qty 1

## 2018-10-05 MED ORDER — PROCHLORPERAZINE MALEATE 5 MG PO TABS
5.0000 mg | ORAL_TABLET | Freq: Four times a day (QID) | ORAL | Status: DC | PRN
  Administered 2018-10-06: 08:00:00 10 mg via ORAL
  Filled 2018-10-05 (×2): qty 2

## 2018-10-05 MED ORDER — DIPHENHYDRAMINE HCL 12.5 MG/5ML PO ELIX
12.5000 mg | ORAL_SOLUTION | Freq: Four times a day (QID) | ORAL | Status: DC | PRN
  Administered 2018-10-09: 25 mg via ORAL
  Filled 2018-10-05: qty 10

## 2018-10-05 MED ORDER — PANTOPRAZOLE SODIUM 40 MG PO TBEC
40.0000 mg | DELAYED_RELEASE_TABLET | Freq: Every day | ORAL | Status: DC
  Administered 2018-10-06 – 2018-10-14 (×9): 40 mg via ORAL
  Filled 2018-10-05 (×9): qty 1

## 2018-10-05 MED ORDER — PREGABALIN 100 MG PO CAPS
200.0000 mg | ORAL_CAPSULE | Freq: Two times a day (BID) | ORAL | Status: DC
  Administered 2018-10-05 – 2018-10-14 (×18): 200 mg via ORAL
  Filled 2018-10-05 (×18): qty 2

## 2018-10-05 MED ORDER — GUAIFENESIN-DM 100-10 MG/5ML PO SYRP
5.0000 mL | ORAL_SOLUTION | Freq: Four times a day (QID) | ORAL | Status: DC | PRN
  Administered 2018-10-14: 10 mL via ORAL
  Filled 2018-10-05: qty 10

## 2018-10-05 MED ORDER — AMLODIPINE BESYLATE 10 MG PO TABS
10.0000 mg | ORAL_TABLET | Freq: Every day | ORAL | Status: DC
  Administered 2018-10-06 – 2018-10-14 (×7): 10 mg via ORAL
  Filled 2018-10-05 (×9): qty 1

## 2018-10-05 MED ORDER — ACETAMINOPHEN 325 MG PO TABS
325.0000 mg | ORAL_TABLET | ORAL | Status: DC | PRN
  Administered 2018-10-11 – 2018-10-14 (×5): 650 mg via ORAL
  Filled 2018-10-05 (×6): qty 2

## 2018-10-05 MED ORDER — ALUM & MAG HYDROXIDE-SIMETH 200-200-20 MG/5ML PO SUSP: 30.0000 mL | ORAL | Status: DC | PRN

## 2018-10-05 MED ORDER — SENNOSIDES-DOCUSATE SODIUM 8.6-50 MG PO TABS
2.0000 | ORAL_TABLET | Freq: Every day | ORAL | Status: DC
  Administered 2018-10-05 – 2018-10-08 (×4): 2 via ORAL
  Filled 2018-10-05 (×4): qty 2

## 2018-10-05 MED ORDER — ENOXAPARIN SODIUM 40 MG/0.4ML ~~LOC~~ SOLN
40.0000 mg | SUBCUTANEOUS | Status: DC
  Administered 2018-10-05 – 2018-10-07 (×3): 40 mg via SUBCUTANEOUS
  Filled 2018-10-05 (×3): qty 0.4

## 2018-10-05 MED ORDER — LEVOFLOXACIN 500 MG PO TABS: 500.0000 mg | ORAL_TABLET | Freq: Every day | ORAL | Status: DC

## 2018-10-05 MED ORDER — HYDROCERIN EX CREA
TOPICAL_CREAM | Freq: Two times a day (BID) | CUTANEOUS | Status: DC
  Administered 2018-10-05: 1 via TOPICAL
  Administered 2018-10-06 – 2018-10-13 (×13): via TOPICAL
  Administered 2018-10-13: 1 via TOPICAL
  Administered 2018-10-14: 08:00:00 via TOPICAL
  Filled 2018-10-05: qty 113

## 2018-10-05 MED ORDER — BUPROPION HCL ER (XL) 150 MG PO TB24
300.0000 mg | ORAL_TABLET | Freq: Every day | ORAL | Status: DC
  Administered 2018-10-06 – 2018-10-14 (×9): 300 mg via ORAL
  Filled 2018-10-05 (×9): qty 2

## 2018-10-05 MED ORDER — FLEET ENEMA 7-19 GM/118ML RE ENEM
1.0000 | ENEMA | Freq: Every day | RECTAL | Status: DC | PRN
  Administered 2018-10-05: 1 via RECTAL
  Filled 2018-10-05: qty 1

## 2018-10-05 MED ORDER — PROCHLORPERAZINE EDISYLATE 10 MG/2ML IJ SOLN: 5.0000 mg | Freq: Four times a day (QID) | INTRAMUSCULAR | Status: DC | PRN

## 2018-10-05 MED ORDER — FLEET ENEMA 7-19 GM/118ML RE ENEM
1.0000 | ENEMA | Freq: Once | RECTAL | Status: AC | PRN
  Administered 2018-10-08: 18:00:00 1 via RECTAL
  Filled 2018-10-05: qty 1

## 2018-10-05 MED ORDER — TIZANIDINE HCL 4 MG PO TABS
4.0000 mg | ORAL_TABLET | Freq: Three times a day (TID) | ORAL | Status: DC | PRN
  Administered 2018-10-05 – 2018-10-08 (×5): 4 mg via ORAL
  Administered 2018-10-09 – 2018-10-10 (×2): 8 mg via ORAL
  Administered 2018-10-10: 4 mg via ORAL
  Administered 2018-10-12: 8 mg via ORAL
  Filled 2018-10-05: qty 1
  Filled 2018-10-05: qty 2
  Filled 2018-10-05 (×4): qty 1
  Filled 2018-10-05 (×3): qty 2
  Filled 2018-10-05 (×2): qty 1

## 2018-10-05 MED ORDER — TRAZODONE HCL 50 MG PO TABS: 25.0000 mg | ORAL_TABLET | Freq: Every evening | ORAL | Status: DC | PRN

## 2018-10-05 MED ORDER — ADULT MULTIVITAMIN W/MINERALS CH
1.0000 | ORAL_TABLET | Freq: Every day | ORAL | Status: DC
  Administered 2018-10-06 – 2018-10-14 (×9): 1 via ORAL
  Filled 2018-10-05 (×9): qty 1

## 2018-10-05 MED ORDER — BISACODYL 10 MG RE SUPP
10.0000 mg | Freq: Every day | RECTAL | Status: DC | PRN
  Administered 2018-10-11 – 2018-10-13 (×2): 10 mg via RECTAL
  Filled 2018-10-05 (×3): qty 1

## 2018-10-05 MED ORDER — ALBUTEROL SULFATE (2.5 MG/3ML) 0.083% IN NEBU: 3.0000 mL | INHALATION_SOLUTION | Freq: Four times a day (QID) | RESPIRATORY_TRACT | Status: DC | PRN

## 2018-10-05 MED ORDER — TRAMADOL HCL 50 MG PO TABS
50.0000 mg | ORAL_TABLET | Freq: Four times a day (QID) | ORAL | Status: DC | PRN
  Administered 2018-10-07 – 2018-10-14 (×14): 50 mg via ORAL
  Filled 2018-10-05 (×15): qty 1

## 2018-10-05 MED ORDER — PROCHLORPERAZINE 25 MG RE SUPP: 12.5000 mg | Freq: Four times a day (QID) | RECTAL | Status: DC | PRN

## 2018-10-05 MED ORDER — HYDROCHLOROTHIAZIDE 25 MG PO TABS
25.0000 mg | ORAL_TABLET | Freq: Every day | ORAL | Status: DC
  Administered 2018-10-06 – 2018-10-14 (×7): 25 mg via ORAL
  Filled 2018-10-05 (×9): qty 1

## 2018-10-05 NOTE — Progress Notes (Signed)
Subjective: 2 Days Post-Op Procedure(s) (LRB): REPEAT IRRIGATION AND DEBRIDEMENT RIGHT LOWER LEG, VAC PLACEMENT (Right) Patient reports pain as moderate.   Reports no BM since admit and requests a Fleets enema.  Also reports burning with urination, check UA and UCx Objective: Vital signs in last 24 hours: Temp:  [98.5 F (36.9 C)-98.6 F (37 C)] 98.6 F (37 C) (05/22 0414) Pulse Rate:  [72-92] 72 (05/22 0414) Resp:  [16-18] 17 (05/22 0414) BP: (112-132)/(57-79) 113/79 (05/22 0414) SpO2:  [98 %-100 %] 99 % (05/22 0414)  Intake/Output from previous day: 05/21 0701 - 05/22 0700 In: 390.8 [P.O.:360; I.V.:30.8] Out: 1650 [Urine:1600; Drains:50] Intake/Output this shift: No intake/output data recorded.  No results for input(s): HGB in the last 72 hours. No results for input(s): WBC, RBC, HCT, PLT in the last 72 hours. No results for input(s): NA, K, CL, CO2, BUN, CREATININE, GLUCOSE, CALCIUM in the last 72 hours. Recent Labs    10/04/18 0208 10/05/18 0208  INR 1.2 1.2    Right lower leg VAC in place and functioning well. 100 cc of cloudy mainly serous appearing fluid in the canister.    Assessment/Plan: 2 Days Post-Op Procedure(s) (LRB): REPEAT IRRIGATION AND DEBRIDEMENT RIGHT LOWER LEG, VAC PLACEMENT (Right) Continue VAC to right lower leg Continue therapy and possibly admit to CIR. ID- Cultures Proteus m., Staph caprae and Serratia m. - ID recommends po Levaquin.   Erlinda Hong, PA-C 10/05/2018, [place]

## 2018-10-05 NOTE — Progress Notes (Signed)
[facility] for Infectious Disease  Date of Admission:  09/28/2018   Total days of antibiotics 7        Day 3 of levofloxacin          ASSESSMENT: Per Dr. Jess Barters operative note the infection appears to have improved greatly with not local signs of concern. We recommend discontinuation of antibiotics. She remained afebrile overnight.  Thank you for this consult. Please notify us if any additional needs arise.   PLAN: 1. 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 amLODipine  10 mg Oral Daily  . buPROPion  300 mg Oral Daily  . docusate sodium  100 mg Oral BID  . hydrochlorothiazide  25 mg Oral Daily  . levofloxacin  500 mg Oral Daily  . multivitamin with minerals  1 tablet Oral Daily  . pantoprazole  40 mg Oral Daily  . pregabalin  200 mg Oral BID  . Ensure Max Protein  11 oz Oral BID   Continuous Infusions: . sodium chloride Stopped (10/01/18 1704)  . sodium chloride Stopped (10/04/18 0812)  . lactated ringers 10 mL/hr at 10/03/18 1145  . methocarbamol (ROBAXIN) IV     PRN Meds:.sodium chloride, acetaminophen, albuterol, bisacodyl, HYDROmorphone (DILAUDID) injection, magnesium citrate, methocarbamol **OR** methocarbamol (ROBAXIN) IV, metoCLOPramide **OR** metoCLOPramide (REGLAN) injection, ondansetron **OR** ondansetron (ZOFRAN) IV, ondansetron **OR** ondansetron (ZOFRAN) IV, oxyCODONE, oxyCODONE, polyethylene glycol, sodium phosphate, tiZANidine   SUBJECTIVE: The patient stated that she feels greatly improved today. Her pain persists but is improving. She understood the plan to refrain from additional antibiotic treatment based on her current progression and will  monitor for signs of local infection including redness, swelling, worsening pain, discharge or odor.   Review of Systems: Review of Systems  Constitutional: Negative for diaphoresis and fever.  Respiratory: Negative for cough and shortness of breath.   Cardiovascular: Negative for chest pain and leg swelling.  Gastrointestinal: Negative for nausea and vomiting.  Genitourinary: Negative for flank pain and urgency.  Neurological: Negative for dizziness and headaches.  Psychiatric/Behavioral: Negative for depression. The patient is not nervous/anxious.    Allergies  Allergen Reactions  . Oxycodone Hcl Nausea And Vomiting  . Vicodin [Hydrocodone-Acetaminophen] Nausea And Vomiting   OBJECTIVE: Vitals:   10/04/18 1446 10/04/18 1935 10/04/18 2338 10/05/18 0414  BP: 119/70 (!) 132/57 118/67 113/79  Pulse: 88 76 91 72  Resp: 18 16 17 17   Temp:  98.6 F (37 C) 98.6 F (37 C) 98.6 F (37 C)  TempSrc:  Oral Oral Oral  SpO2: 100% 100% 100% 99%  Weight:      Height:       Body mass index is 53.02 kg/m.  Physical Exam Vitals signs reviewed.  Constitutional:      General: She is not in acute distress.    Appearance: She is well-developed. She is not diaphoretic.  Neck:     Musculoskeletal: Normal range of motion and neck supple.  Cardiovascular:     Rate and Rhythm: Normal rate and regular rhythm.     Heart sounds: No murmur.  Pulmonary:     Effort: Pulmonary effort is normal. No respiratory distress.  Breath sounds: Normal breath sounds. No stridor.  Abdominal:     General: Bowel sounds are normal. There is no distension.     Palpations: Abdomen is soft.     Tenderness: There is no abdominal tenderness.  Musculoskeletal:        General: Tenderness (Surgical site) present.  Skin:    General: Skin is warm.     Capillary Refill: Capillary refill takes less than 2 seconds.  Neurological:     Mental Status: She is alert and oriented to person, place, and time.     Lab  Results Lab Results  Component Value Date   WBC 5.6 09/28/2018   HGB 10.6 (L) 09/28/2018   HCT 33.3 (L) 09/28/2018   MCV 76.6 (L) 09/28/2018   PLT 280 09/28/2018    Lab Results  Component Value Date   CREATININE 1.27 (H) 10/02/2018   BUN 26 (H) 09/28/2018   NA 138 09/28/2018   K 3.3 (L) 09/28/2018   CL 104 09/28/2018   CO2 24 09/28/2018    Lab Results  Component Value Date   ALT 11 01/29/2018   AST 26 01/29/2018   ALKPHOS 114 01/29/2018   BILITOT 0.3 01/29/2018     Microbiology: Recent Results (from the past 240 hour(s))  SARS Coronavirus 2 (CEPHEID - Performed in [facility] hospital lab), Hosp Order     Status: None   Collection Time: 09/26/18  2:40 PM  Result Value Ref Range Status   SARS Coronavirus 2 NEGATIVE NEGATIVE Final    Comment: (NOTE) If result is NEGATIVE SARS-CoV-2 target nucleic acids are NOT DETECTED. The SARS-CoV-2 RNA is generally detectable in upper and lower  respiratory specimens during the acute phase of infection. The lowest  concentration of SARS-CoV-2 viral copies this assay can detect is 250  copies / mL. A negative result does not preclude SARS-CoV-2 infection  and should not be used as the sole basis for treatment or other  patient management decisions.  A negative result may occur with  improper specimen collection / handling, submission of specimen other  than nasopharyngeal swab, presence of viral mutation(s) within the  areas targeted by this assay, and inadequate number of viral copies  (<250 copies / mL). A negative result must be combined with clinical  observations, patient history, and epidemiological information. If result is POSITIVE SARS-CoV-2 target nucleic acids are DETECTED. The SARS-CoV-2 RNA is generally detectable in upper and lower  respiratory specimens dur ing the acute phase of infection.  Positive  results are indicative of active infection with SARS-CoV-2.  Clinical  correlation with patient history and other  diagnostic information is  necessary to determine patient infection status.  Positive results do  not rule out bacterial infection or co-infection with other viruses. If result is PRESUMPTIVE POSTIVE SARS-CoV-2 nucleic acids MAY BE PRESENT.   A presumptive positive result was obtained on the submitted specimen  and confirmed on repeat testing.  While 2019 novel coronavirus  (SARS-CoV-2) nucleic acids may be present in the submitted sample  additional confirmatory testing may be necessary for epidemiological  and / or clinical management purposes  to differentiate between  SARS-CoV-2 and other Sarbecovirus currently known to infect humans.  If clinically indicated additional testing with an alternate test  methodology [phone]) is advised. The SARS-CoV-2 RNA is generally  detectable in upper and lower respiratory sp ecimens during the acute  phase of infection. The expected result is Negative. Fact Sheet for Patients:  StrictlyIdeas.no Fact Sheet for Healthcare  Providers: BankingDealers.co.za This test is not yet approved or cleared by the [place] and has been authorized for detection and/or diagnosis of SARS-CoV-2 by FDA under an Emergency Use Authorization (EUA).  This EUA will remain in effect (meaning this test can be used) for the duration of the COVID-19 declaration under Section 564(b)(1) of the Act, 21 U.S.C. section 360bbb-3(b)(1), unless the authorization is terminated or revoked sooner. Performed at [facility], [place] [address] [place], [place] [address]   Aerobic/Anaerobic Culture (surgical/deep wound)     Status: None   Collection Time: 09/28/18  7:52 AM  Result Value Ref Range Status   Specimen Description TISSUE RIGHT LOWER LEG  Final   Special Requests NONE  Final   Gram Stain   Final    FEW WBC PRESENT, PREDOMINANTLY PMN FEW GRAM POSITIVE COCCI IN PAIRS FEW GRAM POSITIVE RODS    Culture    Final    MODERATE PROTEUS MIRABILIS MODERATE STAPHYLOCOCCUS CAPRAE MODERATE SERRATIA MARCESCENS NO ANAEROBES ISOLATED Performed at [facility], [place] [address], [place], [place] [address]    Report Status 10/03/2018 FINAL  Final   Organism ID, Bacteria PROTEUS MIRABILIS  Final   Organism ID, Bacteria STAPHYLOCOCCUS CAPRAE  Final   Organism ID, Bacteria SERRATIA MARCESCENS  Final      Susceptibility   Staphylococcus caprae - MIC*    CIPROFLOXACIN <=0.5 SENSITIVE Sensitive     ERYTHROMYCIN <=0.25 SENSITIVE Sensitive     GENTAMICIN <=0.5 SENSITIVE Sensitive     OXACILLIN <=0.25 SENSITIVE Sensitive     TETRACYCLINE <=1 SENSITIVE Sensitive     VANCOMYCIN <=0.5 SENSITIVE Sensitive     TRIMETH/SULFA <=10 SENSITIVE Sensitive     CLINDAMYCIN <=0.25 SENSITIVE Sensitive     RIFAMPIN <=0.5 SENSITIVE Sensitive     Inducible Clindamycin NEGATIVE Sensitive     * MODERATE STAPHYLOCOCCUS CAPRAE   Proteus mirabilis - MIC*    AMPICILLIN <=2 SENSITIVE Sensitive     CEFAZOLIN <=4 SENSITIVE Sensitive     CEFEPIME <=1 SENSITIVE Sensitive     CEFTAZIDIME <=1 SENSITIVE Sensitive     CEFTRIAXONE <=1 SENSITIVE Sensitive     CIPROFLOXACIN <=0.25 SENSITIVE Sensitive     GENTAMICIN <=1 SENSITIVE Sensitive     IMIPENEM 2 SENSITIVE Sensitive     TRIMETH/SULFA <=20 SENSITIVE Sensitive     AMPICILLIN/SULBACTAM <=2 SENSITIVE Sensitive     PIP/TAZO <=4 SENSITIVE Sensitive     * MODERATE PROTEUS MIRABILIS   Serratia marcescens - MIC*    CEFAZOLIN >=64 RESISTANT Resistant     CEFEPIME <=1 SENSITIVE Sensitive     CEFTAZIDIME <=1 SENSITIVE Sensitive     CEFTRIAXONE <=1 SENSITIVE Sensitive     CIPROFLOXACIN <=0.25 SENSITIVE Sensitive     GENTAMICIN <=1 SENSITIVE Sensitive     TRIMETH/SULFA <=20 SENSITIVE Sensitive     * MODERATE SERRATIA MARCESCENS  Surgical pcr screen     Status: None   Collection Time: 09/30/18  3:09 PM  Result Value Ref Range Status   MRSA, PCR NEGATIVE NEGATIVE Final    Staphylococcus aureus NEGATIVE NEGATIVE Final    Comment: (NOTE) The Xpert SA Assay (FDA approved for NASAL specimens in patients 67 years of age and older), is one component of a comprehensive surveillance program. It is not intended to diagnose infection nor to guide or monitor treatment. Performed at [facility], [place] [address], [place], [place] [address]     Kathi Ludwig, [place] for Infectious Disease  [place] [phone] pager   [phone] cell 10/05/2018, 8:39 AM

## 2018-10-05 NOTE — Progress Notes (Signed)
Meredith Staggers, MD  Physician  Physical Medicine and Rehabilitation  PMR Pre-admission  Signed  Date of Service:  10/05/2018 12:04 PM       Related encounter: Admission (Current) from 09/28/2018 in [facility]      Signed         Show:Clear all _0 Manual_1 Template_2 Copied  Added by: _3 Meredith Staggers, MD_4 Michel Santee, PT  _5 Hover for details PMR Admission Coordinator Pre-Admission Assessment  Patient: Alice [name] is an 48 y.o., female MRN: [id number] DOB: 02-02-1971 Height: _6  (167.6 cm) Weight: (!) 149 kg  Insurance Information HMO:     PPO:      PCP:      IPA:      80/20:      OTHER:  PRIMARY: Medicare A and B      Policy#: [id number]      Subscriber: patient CM Name:       Phone#:      Fax#:  Pre-Cert#: verified online via [employer]      Employer:  Benefits:  Phone #:      Name:  Eff. Date: 10/14/04     Deduct: $1408      Out of Pocket Max: n/a      Life Max: n/a CIR: 100%      SNF: 20 full days Outpatient: 80%     Co-Pay: 20% Home Health: 100%      Co-Pay:  DME: 80%     Co-Pay: 20% Providers: patient choice  SECONDARY: Medicaid of [place]      Policy#: [id number]      Subscriber: patient CM Name:       Phone#:      Fax#:  Pre-Cert#: verified online via [employer]      Employer:  Benefits:  Phone #:      Name:  Eff. Date: effective as of 10/05/2018, coverage code [id number]  Medicaid Application Date:       Case Manager:  Disability Application Date:       Case Worker:   The "Data Collection Information Summary" for patients in Inpatient Rehabilitation Facilities with attached "Privacy Act [place] Records" was provided and verbally reviewed with: Patient  Emergency Contact Information         Contact Information    Name Relation Home Work Pence Sister   [phone]      Current Medical History  Patient Admitting Diagnosis: chronic ulcer with bactremia  infection requiring multiple I&Ds, skin graft, and wound vac closure.   History of Present Illness: Pt is a 48 y/o female with PMH of HTN, migranes, morbid obesity (BMI 53.02), PE, OSA, admitted with failed conservative treatment of RLE venous stasis ulcer.  Pt developed bactremia infection and required multiple I&D procedures with skin grafts, and wound vac application (2/20, and 2/54).  Hospital course pain management.  She was significantly limited by pain following procedures with increasing need for assistance with all mobility.  Therapy evaluations recommended CIR.    Patient[facility] from [facility] has been reviewed by the rehabilitation admission coordinator and physician.  Past Medical History      Past Medical History:  Diagnosis Date  . Anemia   . Anxiety   . Arthritis   . Chronic bronchitis ([place])   . Chronic upper back pain   . Depression   . DVT (deep venous thrombosis) (HCC)    BLE  . GERD (gastroesophageal reflux disease)   .  Headache    "weekly" (04/16/2015)  . Hypertension   . Migraine    "monthly" (04/16/2015)  . Morbid obesity with BMI of 50.0-59.9, adult ([place])   . Obesity   . Pneumonia 2014  . Pulmonary embolism ([place])   . Sinusitis nasal   . Sleep apnea    "I'm suppose to wear a mask but I don't" (04/16/2015)  . Varicose veins of right lower extremity   . Venous stasis of lower extremity    right  . Wears dentures   . Wears glasses     Family History   family history includes Kidney disease in her mother.  Prior Rehab/Hospitalizations Has the patient had prior rehab or hospitalizations prior to admission? No  Has the patient had major surgery during 100 days prior to admission? Yes             Current Medications  Current Facility-Administered Medications:  .  0.9 %  sodium chloride infusion, , Intravenous, PRN, [name], MD, Stopped at 10/01/18 1704 .  0.9 %  sodium chloride infusion, ,  Intravenous, Continuous, [name], MD, Stopped at 10/04/18 [phone] .  acetaminophen (TYLENOL) tablet 325-650 mg, 325-650 mg, Oral, Q6H PRN, [name], MD, 650 mg at 10/02/18 1043 .  albuterol (PROVENTIL) (2.5 MG/3ML) 0.083% nebulizer solution 3 mL, 3 mL, Inhalation, Q6H PRN, [name], MD .  amLODipine (NORVASC) tablet 10 mg, 10 mg, Oral, Daily, [name], MD, 10 mg at 10/05/18 6962 .  bisacodyl (DULCOLAX) suppository 10 mg, 10 mg, Rectal, Daily PRN, [name], MD .  buPROPion (WELLBUTRIN XL) 24 hr tablet 300 mg, 300 mg, Oral, Daily, [name], MD, 300 mg at 10/05/18 9528 .  docusate sodium (COLACE) capsule 100 mg, 100 mg, Oral, BID, [name], MD, 100 mg at 10/05/18 4132 .  hydrochlorothiazide (HYDRODIURIL) tablet 25 mg, 25 mg, Oral, Daily, [name], MD, 25 mg at 10/05/18 4401 .  HYDROmorphone (DILAUDID) injection 0.5-1 mg, 0.5-1 mg, Intravenous, Q4H PRN, [name], MD, 1 mg at 10/03/18 1558 .  lactated ringers infusion, , Intravenous, Continuous, [name], MD, Last Rate: 10 mL/hr at 10/03/18 1145 .  magnesium citrate solution 1 Bottle, 1 Bottle, Oral, Once PRN, [name], MD .  methocarbamol (ROBAXIN) tablet 500 mg, 500 mg, Oral, Q6H PRN, 500 mg at 10/03/18 1818 **OR** methocarbamol (ROBAXIN) 500 mg in dextrose 5 % 50 mL IVPB, 500 mg, Intravenous, Q6H PRN, [name], MD .  metoCLOPramide (REGLAN) tablet 5-10 mg, 5-10 mg, Oral, Q8H PRN **OR** metoCLOPramide (REGLAN) injection 5-10 mg, 5-10 mg, Intravenous, Q8H PRN, [name], MD .  multivitamin with minerals tablet 1 tablet, 1 tablet, Oral, Daily, [name], MD, 1 tablet at 10/05/18 [phone] .  ondansetron (ZOFRAN) tablet 4 mg, 4 mg, Oral, Q6H PRN **OR** ondansetron (ZOFRAN) injection 4 mg, 4 mg, Intravenous, Q6H PRN, [name], MD .  ondansetron [facility]) tablet 4 mg, 4 mg, Oral, Q6H PRN, 4 mg at 10/03/18 1818 **OR** ondansetron (ZOFRAN) injection 4 mg, 4 mg, Intravenous, Q6H PRN,  [name], MD .  oxyCODONE (Oxy IR/ROXICODONE) immediate release tablet 10-15 mg, 10-15 mg, Oral, Q4H PRN, [name], MD, 15 mg at 10/05/18 0849 .  oxyCODONE (Oxy IR/ROXICODONE) immediate release tablet 5-10 mg, 5-10 mg, Oral, Q4H PRN, [name], MD, 10 mg at 10/03/18 1818 .  pantoprazole (PROTONIX) EC tablet 40 mg, 40 mg, Oral, Daily,  [name], MD, 40 mg at 10/05/18 1607 .  polyethylene glycol (MIRALAX / GLYCOLAX) packet 17 g, 17 g, Oral, Daily PRN, [name], MD, 17 g at 10/04/18 0500 .  pregabalin (LYRICA) capsule 200 mg, 200 mg, Oral, BID, [name], MD, 200 mg at 10/05/18 3710 .  protein supplement (ENSURE MAX) liquid, 11 oz, Oral, BID, [name], MD, 11 oz at 10/05/18 [phone] .  sodium phosphate (FLEET) 7-19 GM/118ML enema 1 enema, 1 enema, Rectal, Daily PRN, [place], Neta Mends, PA-C, 1 enema at 10/05/18 1048 .  tiZANidine (ZANAFLEX) tablet 4-8 mg, 4-8 mg, Oral, Q8H PRN, [name], MD, 4 mg at 10/03/18 4854  Patients Current Diet:     Diet Order                  Diet - low sodium heart healthy         Diet Carb Modified Fluid consistency: Thin; Room service appropriate? Yes  Diet effective now               Precautions / Restrictions Precautions Precautions: Fall Precaution Comments: RLE wound vac Restrictions Weight Bearing Restrictions: Yes RLE Weight Bearing: Weight bearing as tolerated   Has the patient had 2 or more falls or a fall with injury in the past year? No  Prior Activity Level Community (5-7x/wk): active, driving, assisting her aunt with her uncle who has cancer  Prior Functional Level Self Care: Did the patient need help bathing, dressing, using the toilet or eating? Independent  Indoor Mobility: Did the patient need assistance with walking from room to room (with or without device)? Independent  Stairs: Did the patient need assistance with internal or external stairs (with or without device)?  Independent  Functional Cognition: Did the patient need help planning regular tasks such as shopping or remembering to take medications? Independent  Home Assistive Devices / Equipment Home Assistive Devices/Equipment: Eyeglasses, Dentures (specify type)(upppers) Home Equipment: None  Prior Device Use: Indicate devices/aids used by the patient prior to current illness, exacerbation or injury? None of the above  Current Functional Level Cognition  Overall Cognitive Status: Within Functional Limits for tasks assessed Orientation Level: Oriented X4 General Comments: WFL although distracted by pain     Extremity Assessment (includes Sensation/Coordination)  Upper Extremity Assessment: Overall WFL for tasks assessed  Lower Extremity Assessment: Defer to PT evaluation RLE Deficits / Details: s/p R lower leg I&D; hip flex at least 3/5 RLE: Unable to fully assess due to pain RLE Coordination: decreased gross motor    ADLs  Overall ADL's : Needs assistance/impaired Eating/Feeding: Independent, Sitting Grooming: Sitting, Supervision/safety Upper Body Bathing: Supervision/ safety, Sitting Lower Body Bathing: Moderate assistance, Sit to/from stand Upper Body Dressing : Supervision/safety, Sitting Lower Body Dressing: Maximal assistance, Sit to/from stand Toilet Transfer: Moderate assistance, BSC, RW, Stand-pivot Toilet Transfer Details (indicate cue type and reason): Simulated with transfer from bed to recliner General ADL Comments: Pt moving slowly due to pain.    Mobility  Overal bed mobility: Needs Assistance Bed Mobility: Supine to Sit Supine to sit: Supervision, HOB elevated General bed mobility comments: Use of bed rail. Increased pain due to right LE pain.     Transfers  Overall transfer level: Needs assistance Equipment used: Rolling walker (2 wheeled) Transfers: Sit to/from Stand, [employer] Transfers Sit to Stand: Mod assist, From elevated surface Stand pivot  transfers: Min assist General transfer comment: Pt stood with assist for power up from bed. Verbal  and tactile cues to bring chest up into standing. Pt pivoted to left side to transfer to recliner. Avoidant of right LE weight bearing.    Ambulation / Gait / Stairs / Wheelchair Mobility  Ambulation/Gait General Gait Details: Unable due to pain    Posture / Balance Balance Overall balance assessment: Needs assistance Sitting balance-Leahy Scale: Fair Standing balance-Leahy Scale: Poor Standing balance comment: Unable to achieve fully upright standing today due to pain    Special needs/care consideration BiPAP/CPAP has CPAP, does not use CPM no Continuous Drip IV no Dialysis no        Days n/a Life Vest no Oxygen no Special Bed no Trach Size no Wound Vac (area) yes      Location RLE Skin incision to RLE Bowel mgmt: continent, last BM 09/28/2018 Bladder mgmt: continent Diabetic mgmt: no Behavioral consideration no Chemo/radiation no   Previous Home Environment (from acute therapy documentation) Living Arrangements: Alone Available Help at Discharge: Family, Available 24 hours/day Type of Home: (sister can stay with her) Home Layout: One level Home Access: Stairs to enter Entrance Stairs-Rails: Left Entrance Stairs-Number of Steps: 3 Bathroom Shower/Tub: Chiropodist: Standard Home Care Services: No Additional Comments: Reports RW will fit into apartment, but w/c will not  Discharge Living Setting Plans for Discharge Living Setting: Patient's home, Apartment Type of Home at Discharge: Apartment Discharge Home Layout: One level Discharge Home Access: Stairs to enter Entrance Stairs-Rails: Left(wobbly) Entrance Stairs-Number of Steps: 4(top step has a larger rise) Discharge Bathroom Shower/Tub: Tub/shower unit Discharge Bathroom Toilet: Standard Discharge Bathroom Accessibility: No Does the patient have any problems obtaining your medications?: No   Social/Family/Support Systems Anticipated Caregiver: Pt with mod I goals.  She has several family members in the area that will be able to provide her with transportation, meals, and occasional help at home if needed.  Anticipated Caregiver's Contact Information: Pt: [phone] Ability/Limitations of Caregiver: n/a Caregiver Availability: Other (Comment)(n/a) Discharge Plan Discussed with Primary Caregiver: Yes Is Caregiver In Agreement with Plan?: Yes Does Caregiver/Family have Issues with Lodging/Transportation while Pt is in Rehab?: No  Goals/Additional Needs Patient/Family Goal for Rehab: PT/OT mod I Expected length of stay: 10-12 days Dietary Needs: carb modified, thin Equipment Needs: bariatric drop arm commode Pt/Family Agrees to Admission and willing to participate: Yes Program Orientation Provided & Reviewed with Pt/Caregiver Including Roles  & Responsibilities: Yes  Possible need for SNF placement upon discharge: no  Patient Condition: I have reviewed medical records from [facility], spoken with CM, and patient. I met with patient at the bedside for inpatient rehabilitation assessment.  Patient will benefit from ongoing PT and OT, can actively participate in 3 hours of therapy a day 5 days of the week, and can make measurable gains during the admission.  Patient will also benefit from the coordinated team approach during an Inpatient Acute Rehabilitation admission.  The patient will receive intensive therapy as well as Rehabilitation physician, nursing, social worker, and care management interventions.  Due to safety, skin/wound care, disease management, medication administration, pain management and patient education the patient requires 24 hour a day rehabilitation nursing.  The patient is currently min with mobility and basic ADLs.  Discharge setting and therapy post discharge at home with home health is anticipated.  Patient has agreed to participate in the Acute  Inpatient Rehabilitation Program and will admit today.  Preadmission Screen Completed By:  Michel Santee, PT, DPT 10/05/2018 12:04 PM ______________________________________________________________________   Discussed status with Dr.  Naaman Plummer on 10/05/18  at 12:22 PM and received approval for admission today.  Admission Coordinator:  Michel Santee, PT, DPT time 12:22 PM/Date 10/05/18    Assessment/Plan: Diagnosis: debility d/t leg infection 1. Does the need for close, 24 hr/day Medical supervision in concert with the patient's rehab needs make it unreasonable for this patient to be served in a less intensive setting? Yes 2. Co-Morbidities requiring supervision/potential complications: morbid obesity, DM, OSA,  3. Due to bladder management, bowel management, safety, skin/wound care, disease management, medication administration, pain management and patient education, does the patient require 24 hr/day rehab nursing? Yes 4. Does the patient require coordinated care of a physician, rehab nurse, PT (1-2 hrs/day, 5 days/week) and OT (1-2 hrs/day, 5 days/week) to address physical and functional deficits in the context of the above medical diagnosis(es)? Yes Addressing deficits in the following areas: balance, endurance, locomotion, strength, transferring, bowel/bladder control, bathing, dressing, feeding, grooming, toileting and psychosocial support 5. Can the patient actively participate in an intensive therapy program of at least 3 hrs of therapy 5 days a week? Yes 6. The potential for patient to make measurable gains while on inpatient rehab is excellent 7. Anticipated functional outcomes upon discharge from inpatients are: modified independent PT, modified independent OT, n/a SLP 8. Estimated rehab length of stay to reach the above functional goals is: 10-12 days 9. Anticipated D/C setting: Home 10. Anticipated post D/C treatments: [place] therapy 11. Overall Rehab/Functional Prognosis:  excellent  MD Signature: Rehab Admissions Coordinator to follow up.  Thanks,  Meredith Staggers, MD, Mellody Drown  I have personally performed a face to face diagnostic evaluation of this patient. Additionally, I have examined pertinent labs and radiographic images. I have reviewed and concur with the physician assistant's documentation above.         Revision History

## 2018-10-05 NOTE — Discharge Summary (Signed)
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 female who was admitted 09/28/2018 with a chief complaint of chronic ulcer of right lower leg with infection, with a final diagnosis of Chronic Ulcer Right Lower Leg.  Patient was brought to the operating room on 10/03/2018 and underwent Procedure(s): REPEAT IRRIGATION AND DEBRIDEMENT RIGHT LOWER LEG, VAC PLACEMENT.   She did well following surgery and is being admitted to inpatient rehabilitation to address her functional deficits with mobility and ADL's. Patient was given perioperative antibiotics:  Anti-infectives (From admission, onward)   Start     Dose/Rate Route Frequency Ordered Stop   10/06/18 0000  levofloxacin (LEVAQUIN) 500 MG tablet     500 mg Oral Daily 10/05/18 1031     10/03/18 1500  levofloxacin (LEVAQUIN) tablet 500 mg     500 mg Oral Daily 10/03/18 1457     10/01/18 1200  Ampicillin-Sulbactam (UNASYN) 3 g in sodium chloride 0.9 % 100 mL IVPB  Status:  Discontinued     3 g 200 mL/hr over 30 Minutes Intravenous Every 6 hours 10/01/18 1136 10/03/18 1500   09/28/18 1400  ceFAZolin (ANCEF) IVPB 1 g/50 mL premix  Status:  Discontinued    Note to Pharmacy:  Please see for any dose adjustments for decreased renal function   1 g 100 mL/hr over 30 Minutes Intravenous Every 8 hours 09/28/18 1207 10/01/18 1128   09/28/18 0600  ceFAZolin (ANCEF) 3 g in dextrose 5 % 50 mL IVPB     3 g 100 mL/hr over 30 Minutes Intravenous On call to O.R. 09/28/18 9518 09/28/18 0742     .  Patient was given sequential compression devices, early ambulation, and aspirin for DVT prophylaxis.  Recent vital signs:  Patient Vitals for the past 24 hrs:  BP Temp Temp src Pulse Resp SpO2  10/05/18 0414 113/79 98.6 F (37 C) Oral 72 17 99 %  10/04/18 2338 118/67 98.6 F (37 C) Oral 91 17 100 %  10/04/18 1935 (!) 132/57 98.6 F (37 C) Oral 76 16 100 %  10/04/18 1446 119/70 - - 88 18 100 %  .  Recent laboratory studies: [place] Ekg Site Rite  Result Date: 10/05/2018 If [facility] image not attached, placement could not be confirmed due to current cardiac rhythm.   Discharge Medications:   Allergies as of 10/05/2018      Reactions   Oxycodone Hcl Nausea And Vomiting   Vicodin [hydrocodone-acetaminophen] Nausea And Vomiting      Medication List    STOP taking these medications   naproxen sodium 220 MG tablet Commonly known as:  ALEVE   traMADol 50 MG tablet Commonly known as:  ULTRAM   warfarin 2.5 MG tablet Commonly known as:  COUMADIN     TAKE these medications   albuterol 108 (90 Base) MCG/ACT inhaler Commonly known as:  VENTOLIN HFA Inhale 2 puffs into the lungs every 6 (six) hours as needed for wheezing or shortness of breath.   amLODipine 10 MG tablet Commonly  known as:  NORVASC Take 1 tablet (10 mg total) by mouth daily.   buPROPion 300 MG 24 hr tablet Commonly known as:  WELLBUTRIN XL Take 300 mg by mouth daily.   DULoxetine 30 MG capsule Commonly known as:  CYMBALTA Take 1 capsule (30 mg total) by mouth daily.   hydrochlorothiazide 25 MG tablet Commonly known as:  HYDRODIURIL Take 25 mg by mouth daily.   levofloxacin 500 MG tablet Commonly known as:  LEVAQUIN Take 1 tablet (500 mg total) by mouth daily. Start taking on:  Oct 06, 2018   multivitamin with minerals Tabs tablet Take 1 tablet by mouth daily.   nitroGLYCERIN 0.4 MG SL tablet Commonly known as:  NITROSTAT Place 1 tablet (0.4 mg total) under the tongue every 5 (five) minutes x 3  doses as needed for chest pain.   pantoprazole 40 MG tablet Commonly known as:  PROTONIX Take 1 tablet (40 mg total) by mouth daily. For acid reflux   pentoxifylline 400 MG CR tablet Commonly known as:  TRENTAL TAKE 1 TABLET(400 MG) BY MOUTH THREE TIMES DAILY WITH MEALS   phentermine 37.5 MG tablet Commonly known as:  ADIPEX-P Take 37.5 mg by mouth daily.   pregabalin 200 MG capsule Commonly known as:  LYRICA Take 200 mg by mouth 2 (two) times a day.   tiZANidine 4 MG tablet Commonly known as:  ZANAFLEX Take 4-8 mg by mouth every 8 (eight) hours as needed for muscle spasms (pain).       Diagnostic Studies: [place] Ekg Site Rite  Result Date: 10/05/2018 If [name] image not attached, placement could not be confirmed due to current cardiac rhythm.   Patient benefited maximally from their hospital stay and there were no complications.     Disposition: Discharge disposition: 62-Rehab Facility      Discharge Instructions    Call MD / Call 911   Complete by:  As directed    If you experience chest pain or shortness of breath, CALL 911 and be transported to the hospital emergency room.  If you develope a fever above 101 F, pus (white drainage) or increased drainage or redness at the wound, or calf pain, call your surgeon's office.   Care order/instruction:   Complete by:  As directed    Change out to [name] for discharge to inpatient rehab   Constipation Prevention   Complete by:  As directed    Drink plenty of fluids.  Prune juice may be helpful.  You may use a stool softener, such as Colace (over the counter) 100 mg twice a day.  Use MiraLax (over the counter) for constipation as needed.   Diet - low sodium heart healthy   Complete by:  As directed    Increase activity slowly as tolerated   Complete by:  As directed      Follow-up Information    [name], MD In 1 week.   Specialty:  Orthopedic Surgery Contact information: [place] [address] [phone]            Signed: Ulysees Barns 10/05/2018, 10:32 AM  [employer] [phone]

## 2018-10-05 NOTE — PMR Pre-admission (Signed)
PMR Admission Coordinator Pre-Admission Assessment  Patient: Alice [name] is an 48 y.o., female MRN: [id number] DOB: 05/23/1970 Height: 5' 6"  (167.6 cm) Weight: (!) 149 kg  Insurance Information HMO:     PPO:      PCP:      IPA:      80/20:      OTHER:  PRIMARY: Medicare A and B      Policy#: [id number]      Subscriber: patient CM Name:       Phone#:      Fax#:  Pre-Cert#: verified online via [employer]      Employer:  Benefits:  Phone #:      Name:  Eff. Date: 10/14/04     Deduct: $1408      Out of Pocket Max: n/a      Life Max: n/a CIR: 100%      SNF: 20 full days Outpatient: 80%     Co-Pay: 20% Home Health: 100%      Co-Pay:  DME: 80%     Co-Pay: 20% Providers: patient choice  SECONDARY: Medicaid of [place]      Policy#: [id number]      Subscriber: patient CM Name:       Phone#:      Fax#:  Pre-Cert#: verified online via [employer]      Employer:  Benefits:  Phone #:      Name:  Eff. Date: effective as of 10/05/2018, coverage code [id number]  Medicaid Application Date:       Case Manager:  Disability Application Date:       Case Worker:   The "Data Collection Information Summary" for patients in Inpatient Rehabilitation Facilities with attached "Privacy Act [place] Records" was provided and verbally reviewed with: Patient  Emergency Contact Information Contact Information    Name Relation Home Work [place] Sister   [phone]      Current Medical History  Patient Admitting Diagnosis: chronic ulcer with bactremia infection requiring multiple I&Ds, skin graft, and wound vac closure.   History of Present Illness: Pt is a 48 y/o female with PMH of HTN, migranes, morbid obesity (BMI 53.02), PE, OSA, admitted with failed conservative treatment of RLE venous stasis ulcer.  Pt developed bactremia infection and required multiple I&D procedures with skin grafts, and wound vac application (4/12, and 8/78).  Hospital course pain management.  She was  significantly limited by pain following procedures with increasing need for assistance with all mobility.  Therapy evaluations recommended CIR.      Patient's medical record from [facility] has been reviewed by the rehabilitation admission coordinator and physician.  Past Medical History  Past Medical History:  Diagnosis Date  . Anemia   . Anxiety   . Arthritis   . Chronic bronchitis ([place])   . Chronic upper back pain   . Depression   . DVT (deep venous thrombosis) (HCC)    BLE  . GERD (gastroesophageal reflux disease)   . Headache    "weekly" (04/16/2015)  . Hypertension   . Migraine    "monthly" (04/16/2015)  . Morbid obesity with BMI of 50.0-59.9, adult ([place])   . Obesity   . Pneumonia 2014  . Pulmonary embolism ([place])   . Sinusitis nasal   . Sleep apnea    "I'm suppose to wear a mask but I don't" (04/16/2015)  . Varicose veins of right lower extremity   . Venous stasis of lower extremity  right  . Wears dentures   . Wears glasses     Family History   family history includes Kidney disease in her mother.  Prior Rehab/Hospitalizations Has the patient had prior rehab or hospitalizations prior to admission? No  Has the patient had major surgery during 100 days prior to admission? Yes   Current Medications  Current Facility-Administered Medications:  .  0.9 %  sodium chloride infusion, , Intravenous, PRN, [name], MD, Stopped at 10/01/18 1704 .  0.9 %  sodium chloride infusion, , Intravenous, Continuous, [name], MD, Stopped at 10/04/18 [phone] .  acetaminophen (TYLENOL) tablet 325-650 mg, 325-650 mg, Oral, Q6H PRN, [name], MD, 650 mg at 10/02/18 1043 .  albuterol (PROVENTIL) (2.5 MG/3ML) 0.083% nebulizer solution 3 mL, 3 mL, Inhalation, Q6H PRN, [name], MD .  amLODipine (NORVASC) tablet 10 mg, 10 mg, Oral, Daily, [name], MD, 10 mg at 10/05/18 9604 .  bisacodyl (DULCOLAX) suppository 10 mg, 10 mg, Rectal, Daily PRN, [name], MD .  buPROPion (WELLBUTRIN XL) 24 hr tablet 300 mg, 300 mg, Oral, Daily, [name], MD, 300 mg at 10/05/18 5409 .  docusate sodium (COLACE) capsule 100 mg, 100 mg, Oral, BID, [name], MD, 100 mg at 10/05/18 8119 .  hydrochlorothiazide (HYDRODIURIL) tablet 25 mg, 25 mg, Oral, Daily, [name], MD, 25 mg at 10/05/18 1478 .  HYDROmorphone (DILAUDID) injection 0.5-1 mg, 0.5-1 mg, Intravenous, Q4H PRN, [name], MD, 1 mg at 10/03/18 1558 .  lactated ringers infusion, , Intravenous, Continuous, [name], MD, Last Rate: 10 mL/hr at 10/03/18 1145 .  magnesium citrate solution 1 Bottle, 1 Bottle, Oral, Once PRN, [name], MD .  methocarbamol (ROBAXIN) tablet 500 mg, 500 mg, Oral, Q6H PRN, 500 mg at 10/03/18 1818 **OR** methocarbamol (ROBAXIN) 500 mg in dextrose 5 % 50 mL IVPB, 500 mg, Intravenous, Q6H PRN, [name], MD .  metoCLOPramide (REGLAN) tablet 5-10 mg, 5-10 mg, Oral, Q8H PRN **OR** metoCLOPramide (REGLAN) injection 5-10 mg, 5-10 mg, Intravenous, Q8H PRN, [name], MD .  multivitamin with minerals tablet 1 tablet, 1 tablet, Oral, Daily, [name], MD, 1 tablet at 10/05/18 [phone] .  ondansetron (ZOFRAN) tablet 4 mg, 4 mg, Oral, Q6H PRN **OR** ondansetron (ZOFRAN) injection 4 mg, 4 mg, Intravenous, Q6H PRN, [name], MD .  ondansetron [facility]) tablet 4 mg, 4 mg, Oral, Q6H PRN, 4 mg at 10/03/18 1818 **OR** ondansetron (ZOFRAN) injection 4 mg, 4 mg, Intravenous, Q6H PRN, [name], MD .  oxyCODONE (Oxy IR/ROXICODONE) immediate release tablet 10-15 mg, 10-15 mg, Oral, Q4H PRN, [name], MD, 15 mg at 10/05/18 0849 .  oxyCODONE (Oxy IR/ROXICODONE) immediate release tablet 5-10 mg, 5-10 mg, Oral, Q4H PRN, [name], MD, 10 mg at 10/03/18 1818 .  pantoprazole (PROTONIX) EC tablet 40 mg, 40 mg, Oral, Daily, [name], MD, 40 mg at 10/05/18 2130 .  polyethylene glycol (MIRALAX / GLYCOLAX) packet 17 g, 17 g, Oral, Daily PRN,  [name], MD, 17 g at 10/04/18 0500 .  pregabalin (LYRICA) capsule 200 mg, 200 mg, Oral, BID, [name], MD, 200 mg at 10/05/18 8657 .  protein supplement (ENSURE MAX) liquid, 11 oz, Oral, BID, [name], MD, 11 oz at 10/05/18 [phone] .  sodium phosphate (FLEET) 7-19 GM/118ML enema 1 enema, 1 enema, Rectal, Daily PRN, [place], Neta Mends, PA-C, 1 enema  at 10/05/18 1048 .  tiZANidine (ZANAFLEX) tablet 4-8 mg, 4-8 mg, Oral, Q8H PRN, [name], MD, 4 mg at 10/03/18 7517  Patients Current Diet:  Diet Order            Diet - low sodium heart healthy        Diet Carb Modified Fluid consistency: Thin; Room service appropriate? Yes  Diet effective now              Precautions / Restrictions Precautions Precautions: Fall Precaution Comments: RLE wound vac Restrictions Weight Bearing Restrictions: Yes RLE Weight Bearing: Weight bearing as tolerated   Has the patient had 2 or more falls or a fall with injury in the past year? No  Prior Activity Level Community (5-7x/wk): active, driving, assisting her aunt with her uncle who has cancer  Prior Functional Level Self Care: Did the patient need help bathing, dressing, using the toilet or eating? Independent  Indoor Mobility: Did the patient need assistance with walking from room to room (with or without device)? Independent  Stairs: Did the patient need assistance with internal or external stairs (with or without device)? Independent  Functional Cognition: Did the patient need help planning regular tasks such as shopping or remembering to take medications? Independent  Home Assistive Devices / Equipment Home Assistive Devices/Equipment: Eyeglasses, Dentures (specify type)(upppers) Home Equipment: None  Prior Device Use: Indicate devices/aids used by the patient prior to current illness, exacerbation or injury? None of the above  Current Functional Level Cognition  Overall Cognitive Status: Within Functional  Limits for tasks assessed Orientation Level: Oriented X4 General Comments: WFL although distracted by pain     Extremity Assessment (includes Sensation/Coordination)  Upper Extremity Assessment: Overall WFL for tasks assessed  Lower Extremity Assessment: Defer to PT evaluation RLE Deficits / Details: s/p R lower leg I&D; hip flex at least 3/5 RLE: Unable to fully assess due to pain RLE Coordination: decreased gross motor    ADLs  Overall ADL's : Needs assistance/impaired Eating/Feeding: Independent, Sitting Grooming: Sitting, Supervision/safety Upper Body Bathing: Supervision/ safety, Sitting Lower Body Bathing: Moderate assistance, Sit to/from stand Upper Body Dressing : Supervision/safety, Sitting Lower Body Dressing: Maximal assistance, Sit to/from stand Toilet Transfer: Moderate assistance, BSC, RW, Stand-pivot Toilet Transfer Details (indicate cue type and reason): Simulated with transfer from bed to recliner General ADL Comments: Pt moving slowly due to pain.    Mobility  Overal bed mobility: Needs Assistance Bed Mobility: Supine to Sit Supine to sit: Supervision, HOB elevated General bed mobility comments: Use of bed rail. Increased pain due to right LE pain.     Transfers  Overall transfer level: Needs assistance Equipment used: Rolling walker (2 wheeled) Transfers: Sit to/from Stand, [employer] Transfers Sit to Stand: Mod assist, From elevated surface Stand pivot transfers: Min assist General transfer comment: Pt stood with assist for power up from bed. Verbal and tactile cues to bring chest up into standing. Pt pivoted to left side to transfer to recliner. Avoidant of right LE weight bearing.    Ambulation / Gait / Stairs / Wheelchair Mobility  Ambulation/Gait General Gait Details: Unable due to pain    Posture / Balance Balance Overall balance assessment: Needs assistance Sitting balance-Leahy Scale: Fair Standing balance-Leahy Scale: Poor Standing balance  comment: Unable to achieve fully upright standing today due to pain    Special needs/care consideration BiPAP/CPAP has CPAP, does not use CPM no Continuous Drip IV no Dialysis no        Days  n/a Life Vest no Oxygen no Special Bed no Trach Size no Wound Vac (area) yes      Location RLE Skin incision to RLE Bowel mgmt: continent, last BM 09/28/2018 Bladder mgmt: continent Diabetic mgmt: no Behavioral consideration no Chemo/radiation no   Previous Home Environment (from acute therapy documentation) Living Arrangements: Alone Available Help at Discharge: Family, Available 24 hours/day Type of Home: (sister can stay with her) Home Layout: One level Home Access: Stairs to enter Entrance Stairs-Rails: Left Entrance Stairs-Number of Steps: 3 Bathroom Shower/Tub: Chiropodist: Standard Home Care Services: No Additional Comments: Reports RW will fit into apartment, but w/c will not  Discharge Living Setting Plans for Discharge Living Setting: Patient's home, Apartment Type of Home at Discharge: Apartment Discharge Home Layout: One level Discharge Home Access: Stairs to enter Entrance Stairs-Rails: Left(wobbly) Entrance Stairs-Number of Steps: 4(top step has a larger rise) Discharge Bathroom Shower/Tub: Tub/shower unit Discharge Bathroom Toilet: Standard Discharge Bathroom Accessibility: No Does the patient have any problems obtaining your medications?: No  Social/Family/Support Systems Anticipated Caregiver: Pt with mod I goals.  She has several family members in the area that will be able to provide her with transportation, meals, and occasional help at home if needed.  Anticipated Caregiver's Contact Information: Pt: [phone] Ability/Limitations of Caregiver: n/a Caregiver Availability: Other (Comment)(n/a) Discharge Plan Discussed with Primary Caregiver: Yes Is Caregiver In Agreement with Plan?: Yes Does Caregiver/Family have Issues with  Lodging/Transportation while Pt is in Rehab?: No  Goals/Additional Needs Patient/Family Goal for Rehab: PT/OT mod I Expected length of stay: 10-12 days Dietary Needs: carb modified, thin Equipment Needs: bariatric drop arm commode Pt/Family Agrees to Admission and willing to participate: Yes Program Orientation Provided & Reviewed with Pt/Caregiver Including Roles  & Responsibilities: Yes  Possible need for SNF placement upon discharge: no  Patient Condition: I have reviewed medical records from [facility], spoken with CM, and patient. I met with patient at the bedside for inpatient rehabilitation assessment.  Patient will benefit from ongoing PT and OT, can actively participate in 3 hours of therapy a day 5 days of the week, and can make measurable gains during the admission.  Patient will also benefit from the coordinated team approach during an Inpatient Acute Rehabilitation admission.  The patient will receive intensive therapy as well as Rehabilitation physician, nursing, social worker, and care management interventions.  Due to safety, skin/wound care, disease management, medication administration, pain management and patient education the patient requires 24 hour a day rehabilitation nursing.  The patient is currently min with mobility and basic ADLs.  Discharge setting and therapy post discharge at home with home health is anticipated.  Patient has agreed to participate in the Acute Inpatient Rehabilitation Program and will admit today.  Preadmission Screen Completed By:  Michel Santee, PT, DPT 10/05/2018 12:04 PM ______________________________________________________________________   Discussed status with Dr. Naaman Plummer on 10/05/18  at 12:22 PM and received approval for admission today.  Admission Coordinator:  Michel Santee, PT, DPT time 12:22 PM/Date 10/05/18    Assessment/Plan: Diagnosis: debility d/t leg infection 1. Does the need for close, 24 hr/day Medical  supervision in concert with the patient's rehab needs make it unreasonable for this patient to be served in a less intensive setting? Yes 2. Co-Morbidities requiring supervision/potential complications: morbid obesity, DM, OSA,  3. Due to bladder management, bowel management, safety, skin/wound care, disease management, medication administration, pain management and patient education, does the patient require 24 hr/day rehab nursing? Yes  4. Does the patient require coordinated care of a physician, rehab nurse, PT (1-2 hrs/day, 5 days/week) and OT (1-2 hrs/day, 5 days/week) to address physical and functional deficits in the context of the above medical diagnosis(es)? Yes Addressing deficits in the following areas: balance, endurance, locomotion, strength, transferring, bowel/bladder control, bathing, dressing, feeding, grooming, toileting and psychosocial support 5. Can the patient actively participate in an intensive therapy program of at least 3 hrs of therapy 5 days a week? Yes 6. The potential for patient to make measurable gains while on inpatient rehab is excellent 7. Anticipated functional outcomes upon discharge from inpatients are: modified independent PT, modified independent OT, n/a SLP 8. Estimated rehab length of stay to reach the above functional goals is: 10-12 days 9. Anticipated D/C setting: Home 10. Anticipated post D/C treatments: [place] therapy 11. Overall Rehab/Functional Prognosis: excellent  MD Signature: Rehab Admissions Coordinator to follow up.  Thanks,  Meredith Staggers, MD, Mellody Drown  I have personally performed a face to face diagnostic evaluation of this patient. Additionally, I have examined pertinent labs and radiographic images. I have reviewed and concur with the physician assistant's documentation above.

## 2018-10-05 NOTE — H&P (Signed)
Physical Medicine and Rehabilitation Admission H&P    CC: Debility.    HPI: Alice [name] is a 48 year old female with history of HTN, GERD, DVT, morbid obesity, chronic RLE ulcers with drainage and conservative therapy. She was admitted on 5/15 and underwent  I & D by Dr. Sharol Given and placed on IV antibiotics.  She was taken back to OR on 5/20 for  I  & D,placement of A cell and wound VAC placed. Wound cultures positive for proteus mirabilis, staph Caprae and Serratia Marscens.  ID consulted following for input as wound showing great improvement, antibiotics discontinued today. Therapy ongoing and patient noted to be debiliataed, has pain limiting WB on RLE as well as anxiety affecting mobility and ADLs. CIR recommended due to functional deficits.    Review of Systems  Constitutional: Negative for chills and fever.  HENT: Negative for hearing loss.   Eyes: Negative for double vision and photophobia.  Respiratory: Negative for cough.   Cardiovascular: Negative for chest pain and palpitations.  Gastrointestinal: Negative for nausea and vomiting.  Genitourinary: Negative for dysuria.  Musculoskeletal: Positive for joint pain and myalgias.  Skin: Negative for rash.  Neurological: Positive for weakness. Negative for dizziness.  Psychiatric/Behavioral: Negative for depression.      Past Medical History:  Diagnosis Date  . Anemia   . Anxiety   . Arthritis   . Chronic bronchitis ([place])   . Chronic upper back pain   . Depression   . DVT (deep venous thrombosis) ([facility])    BLE  . GERD (gastroesophageal reflux disease)   . Headache    "weekly" (04/16/2015)  . Hypertension   . Migraine    "monthly" (04/16/2015)  . Morbid obesity with BMI of 50.0-59.9, adult ([place])   . Obesity   . Pneumonia 2014  . Pulmonary embolism ([place])   . Sinusitis nasal   . Sleep apnea    "I'm suppose to wear a mask but I don't" (04/16/2015)  . Varicose veins of right lower extremity   . Venous stasis of lower  extremity    right  . Wears dentures   . Wears glasses     Past Surgical History:  Procedure Laterality Date  . CHOLECYSTECTOMY N/A 04/18/2015   Procedure: LAPAROSCOPIC CHOLECYSTECTOMY;  Surgeon: Ralene Ok, MD;  Location: [facility];  Service: General;  Laterality: N/A;  . HYSTEROSCOPY W/D&C  12/24/2001   Archie Endo 09/28/2010  . I&D EXTREMITY Right 07/25/2016   Procedure: IRRIGATION AND DEBRIDEMENT RIGHT LEG ULCER, APPLY VERAFLO VAC;  Surgeon: [name], MD;  Location: [facility];  Service: Orthopedics;  Laterality: Right;  . I&D EXTREMITY Right 09/28/2018   Procedure: DEBRIDEMENT RIGHT LOWER LEG WITH PLACEMENT WITH PLACEMENT OF SKIN GRAFT AND VAC;  Surgeon: [name], MD;  Location: [facility];  Service: Orthopedics;  Laterality: Right;  . I&D EXTREMITY Right 10/03/2018   Procedure: REPEAT IRRIGATION AND DEBRIDEMENT RIGHT LOWER LEG, VAC PLACEMENT;  Surgeon: [name], MD;  Location: [facility];  Service: Orthopedics;  Laterality: Right;  . INCISE AND DRAIN ABCESS Right 07/14/2016  . INCISION AND DRAINAGE Right 09/10/2008   leg:  skin and soft tissue and muscle/notes 09/15/2010  . INCISION AND DRAINAGE Right 01/01/2008   Chronic venous stasis insufficiency ulcer,/notes 09/14/2010/  . INCISION AND DRAINAGE Right 08/84   calf w/application wound vac/notes 07/20/2010  . INCISION AND DRAINAGE Right 07/25/2016   IRRIGATION AND DEBRIDEMENT RIGHT LEG ULCER,  . LAPAROSCOPIC GASTRIC BYPASS  ~  2007  . MULTIPLE TOOTH EXTRACTIONS    . SKIN GRAFT SPLIT THICKNESS LEG / FOOT Right 07/25/2016   LEG  . SKIN SPLIT GRAFT Right 07/27/2016   Procedure: SKIN GRAFT RIGHT LEG WITH THERASKIN APPLICATION;  Surgeon: [name], MD;  Location: [facility];  Service: Orthopedics;  Laterality: Right;  . TONSILLECTOMY      Family History  Problem Relation Age of Onset  . Kidney disease Mother        kidney transplant    Social History:   Lives alone. Independent PTA. She reports that she has never smoked. She has never used  smokeless tobacco. She reports that she does not drink alcohol or use drugs.    Allergies  Allergen Reactions  . Oxycodone Hcl Nausea And Vomiting  . Vicodin [Hydrocodone-Acetaminophen] Nausea And Vomiting    Medications Prior to Admission  Medication Sig Dispense Refill  . albuterol (PROVENTIL HFA;VENTOLIN HFA) 108 (90 Base) MCG/ACT inhaler Inhale 2 puffs into the lungs every 6 (six) hours as needed for wheezing or shortness of breath.     Marland Kitchen amLODipine (NORVASC) 10 MG tablet Take 1 tablet (10 mg total) by mouth daily. 31 tablet 0  . buPROPion (WELLBUTRIN XL) 300 MG 24 hr tablet Take 300 mg by mouth daily.    . hydrochlorothiazide (HYDRODIURIL) 25 MG tablet Take 25 mg by mouth daily.    . Multiple Vitamin (MULTIVITAMIN WITH MINERALS) TABS tablet Take 1 tablet by mouth daily.    . naproxen sodium (ALEVE) 220 MG tablet Take 660 mg by mouth 2 (two) times daily as needed (pain).    . pantoprazole (PROTONIX) 40 MG tablet Take 1 tablet (40 mg total) by mouth daily. For acid reflux 30 tablet 0  . phentermine (ADIPEX-P) 37.5 MG tablet Take 37.5 mg by mouth daily.    . pregabalin (LYRICA) 200 MG capsule Take 200 mg by mouth 2 (two) times a day.    Marland Kitchen tiZANidine (ZANAFLEX) 4 MG tablet Take 4-8 mg by mouth every 8 (eight) hours as needed for muscle spasms (pain).    Marland Kitchen warfarin (COUMADIN) 2.5 MG tablet TAKE 3 TABLETS BY MOUTH DAILY OR AS DIRECTED BY ANTICOAGULATION CLINIC (Patient taking differently: Take 7.5-10 mg by mouth See admin instructions. Take 7.5 mg in the morning on Mon, Tue, Thur, Fri, and Sat. Take 10 mg in the morning and Wed and Sun.) 290 tablet 1  . DULoxetine (CYMBALTA) 30 MG capsule Take 1 capsule (30 mg total) by mouth daily. (Patient not taking: Reported on 09/26/2018) 30 capsule 3  . nitroGLYCERIN (NITROSTAT) 0.4 MG SL tablet Place 1 tablet (0.4 mg total) under the tongue every 5 (five) minutes x 3 doses as needed for chest pain. (Patient not taking: Reported on 09/26/2018) 30 tablet 4   . pentoxifylline (TRENTAL) 400 MG CR tablet TAKE 1 TABLET(400 MG) BY MOUTH THREE TIMES DAILY WITH MEALS (Patient not taking: Reported on 09/26/2018) 270 tablet 1  . traMADol (ULTRAM) 50 MG tablet Take 1 tablet (50 mg total) by mouth every 6 (six) hours as needed for moderate pain. (Patient not taking: Reported on 09/26/2018) 30 tablet 0  . traMADol (ULTRAM) 50 MG tablet Take 1 tablet (50 mg total) by mouth every 6 (six) hours as needed for moderate pain. (Patient not taking: Reported on 09/26/2018) 30 tablet 0    Drug Regimen Review  Drug regimen was reviewed and remains appropriate with no significant issues identified  Home: Home Living Family/patient expects to be discharged to::  Private residence Living Arrangements: Alone Available Help at Discharge: Family, Available 24 hours/day Type of Home: (sister can stay with her) Home Access: Stairs to enter Technical brewer of Steps: 3 Entrance Stairs-Rails: Left Home Layout: One level Bathroom Shower/Tub: Chiropodist: [place]: None Additional Comments: Reports RW will fit into apartment, but w/c will not   Functional History: Prior Function Level of Independence: Independent Comments: Not forthcoming with details regarding PLOF when asked about work/hobbies, but also very distracted by pain during evaluation  Functional Status:  Mobility: Bed Mobility Overal bed mobility: Needs Assistance Bed Mobility: Supine to Sit Supine to sit: Supervision, HOB elevated General bed mobility comments: Use of bed rail. Increased pain due to right LE pain.  Transfers Overall transfer level: Needs assistance Equipment used: Rolling walker (2 wheeled) Transfers: Sit to/from Stand, [employer] Transfers Sit to Stand: Mod assist, From elevated surface Stand pivot transfers: Min assist General transfer comment: Pt stood with assist for power up from bed. Verbal and tactile cues to bring chest up into standing.  Pt pivoted to left side to transfer to recliner. Avoidant of right LE weight bearing. Ambulation/Gait General Gait Details: Unable due to pain    ADL: ADL Overall ADL's : Needs assistance/impaired Eating/Feeding: Independent, Sitting Grooming: Sitting, Supervision/safety Upper Body Bathing: Supervision/ safety, Sitting Lower Body Bathing: Moderate assistance, Sit to/from stand Upper Body Dressing : Supervision/safety, Sitting Lower Body Dressing: Maximal assistance, Sit to/from stand Toilet Transfer: Moderate assistance, BSC, RW, Stand-pivot Toilet Transfer Details (indicate cue type and reason): Simulated with transfer from bed to recliner General ADL Comments: Pt moving slowly due to pain.  Cognition: Cognition Overall Cognitive Status: Within Functional Limits for tasks assessed Orientation Level: Oriented X4 Cognition Arousal/Alertness: Awake/alert Behavior During Therapy: WFL for tasks assessed/performed Overall Cognitive Status: Within Functional Limits for tasks assessed General Comments: WFL although distracted by pain    Blood pressure 113/79, pulse 72, temperature 98.6 F (37 C), temperature source Oral, resp. rate 17, height 5\' 6"  (1.676 m), weight (!) 149 kg, SpO2 99 %. Physical Exam  Constitutional: She is oriented to person, place, and time. No distress.  obese  HENT:  Head: Normocephalic and atraumatic.  Eyes: Pupils are equal, round, and reactive to light. EOM are normal.  Neck: Normal range of motion. No thyromegaly present.  Cardiovascular: Normal rate and regular rhythm. Exam reveals no friction rub.  No murmur heard. Respiratory: Effort normal. No respiratory distress. She has no wheezes. She has no rales.  GI: Soft. She exhibits distension. There is no abdominal tenderness.  Musculoskeletal:        General: Edema (RLE) present.     Comments: Right leg dressed with ACE, Vac attached, tender to touch.   Neurological: She is alert and oriented to person,  place, and time. No cranial nerve deficit.  UE 5/5. RLE limited by pain/dressing but grossly 3/5 prox to distal. LLE 3-4/5. Decreased LT right foot (where testable).   Skin: Skin is warm. She is not diaphoretic.  Psychiatric: She has a normal mood and affect. Her behavior is normal. Judgment and thought content normal.    Results for orders placed or performed during the hospital encounter of 09/28/18 (from the past 48 hour(s))  Protime-INR     Status: None   Collection Time: 10/04/18  2:08 AM  Result Value Ref Range   Prothrombin Time 14.8 11.4 - 15.2 seconds   INR 1.2 0.8 - 1.2    Comment: (NOTE) INR goal varies  based on device and disease states. Performed at [facility], [place] [address] [place], [place] [address]   Protime-INR     Status: None   Collection Time: 10/05/18  2:08 AM  Result Value Ref Range   Prothrombin Time 15.0 11.4 - 15.2 seconds   INR 1.2 0.8 - 1.2    Comment: (NOTE) INR goal varies based on device and disease states. Performed at [facility], [place] [address] [place], [place] [address]    [place] Ekg Site Rite  Result Date: 10/05/2018 If [facility] image not attached, placement could not be confirmed due to current cardiac rhythm.      Medical Problem List and Plan: 1.  Functional deficits secondary to debility related to right leg wound with infection and associated complications  -admit to inpatient rehab 2.  H/o DVT/Antithrombotics: -DVT/anticoagulation:  Pharmaceutical: Lovenox---To hold coumadin for now due to bleeding concerns. Ortho to call back   -antiplatelet therapy: N/A 3. Pain Management: Oxycodone prn.  4. Mood: LCSW to follow for evaluation and support.   -antipsychotic agents: N/A 5. Neuropsych: This patient is  capable of making decisions on her own behalf. 6. Skin/Wound Care: Wound VAC changed to [place] today and to remain in place till 10/12/18. Will add prostat to promote wound healing.  7. Fluids/Electrolytes/Nutrition:  Monitor I/O and encourage appropriate PO intake.   Check lytes in am.  8. HTN: Monitor BP bid. Continue HCTZ and Norvasc.  9. Morbid obesity: BMI 53. Educate on appropriate diet and weight loss to help promote mobility.  10. OSA: Noncompliant with CPAP 11. H/O depression: Continue Wellbutrin.      Bary Leriche, PA-C 10/05/2018

## 2018-10-05 NOTE — H&P (Signed)
Physical Medicine and Rehabilitation Admission H&P     CC: Debility.      HPI: Alice [name] is a 48 year old female with history of HTN, GERD, DVT, morbid obesity, chronic RLE ulcers with drainage and conservative therapy. She was admitted on 5/15 and underwent  I & D by Dr. Sharol Given and placed on IV antibiotics.  She was taken back to OR on 5/20 for  I  & D,placement of A cell and wound VAC placed. Wound cultures positive for proteus mirabilis, staph Caprae and Serratia Marscens.  ID consulted following for input as wound showing great improvement, antibiotics discontinued today. Therapy ongoing and patient noted to be debiliataed, has pain limiting WB on RLE as well as anxiety affecting mobility and ADLs. CIR recommended due to functional deficits.     Review of Systems  Constitutional: Negative for chills and fever.  HENT: Negative for hearing loss.   Eyes: Negative for double vision and photophobia.  Respiratory: Negative for cough.   Cardiovascular: Negative for chest pain and palpitations.  Gastrointestinal: Negative for nausea and vomiting.  Genitourinary: Negative for dysuria.  Musculoskeletal: Positive for joint pain and myalgias.  Skin: Negative for rash.  Neurological: Positive for weakness. Negative for dizziness.  Psychiatric/Behavioral: Negative for depression.            Past Medical History:  Diagnosis Date  . Anemia    . Anxiety    . Arthritis    . Chronic bronchitis ([facility])    . Chronic upper back pain    . Depression    . DVT (deep venous thrombosis) ([facility])      BLE  . GERD (gastroesophageal reflux disease)    . Headache      "weekly" (04/16/2015)  . Hypertension    . Migraine      "monthly" (04/16/2015)  . Morbid obesity with BMI of 50.0-59.9, adult ([facility])    . Obesity    . Pneumonia 2014  . Pulmonary embolism ([facility])    . Sinusitis nasal    . Sleep apnea      "I'm suppose to wear a mask but I don't" (04/16/2015)  . Varicose veins of right lower  extremity    . Venous stasis of lower extremity      right  . Wears dentures    . Wears glasses             Past Surgical History:  Procedure Laterality Date  . CHOLECYSTECTOMY N/A 04/18/2015    Procedure: LAPAROSCOPIC CHOLECYSTECTOMY;  Surgeon: Ralene Ok, MD;  Location: [facility];  Service: General;  Laterality: N/A;  . HYSTEROSCOPY W/D&C   12/24/2001    Archie Endo 09/28/2010  . I&D EXTREMITY Right 07/25/2016    Procedure: IRRIGATION AND DEBRIDEMENT RIGHT LEG ULCER, APPLY VERAFLO VAC;  Surgeon: [name], MD;  Location: [facility];  Service: Orthopedics;  Laterality: Right;  . I&D EXTREMITY Right 09/28/2018    Procedure: DEBRIDEMENT RIGHT LOWER LEG WITH PLACEMENT WITH PLACEMENT OF SKIN GRAFT AND VAC;  Surgeon: [name], MD;  Location: [facility];  Service: Orthopedics;  Laterality: Right;  . I&D EXTREMITY Right 10/03/2018    Procedure: REPEAT IRRIGATION AND DEBRIDEMENT RIGHT LOWER LEG, VAC PLACEMENT;  Surgeon: [name], MD;  Location: [facility];  Service: Orthopedics;  Laterality: Right;  . INCISE AND DRAIN ABCESS Right 07/14/2016  . INCISION AND DRAINAGE Right 09/10/2008    leg:  skin and soft tissue and muscle/notes 09/15/2010  .  INCISION AND DRAINAGE Right 01/01/2008    Chronic venous stasis insufficiency ulcer,/notes 09/14/2010/  . INCISION AND DRAINAGE Right 12/1015    calf w/application wound vac/notes 07/20/2010  . INCISION AND DRAINAGE Right 07/25/2016    IRRIGATION AND DEBRIDEMENT RIGHT LEG ULCER,  . LAPAROSCOPIC GASTRIC BYPASS   ~ 2007  . MULTIPLE TOOTH EXTRACTIONS      . SKIN GRAFT SPLIT THICKNESS LEG / FOOT Right 07/25/2016    LEG  . SKIN SPLIT GRAFT Right 07/27/2016    Procedure: SKIN GRAFT RIGHT LEG WITH THERASKIN APPLICATION;  Surgeon: [name], MD;  Location: [facility];  Service: Orthopedics;  Laterality: Right;  . TONSILLECTOMY               Family History  Problem Relation Age of Onset  . Kidney disease Mother          kidney transplant      Social History:   Lives  alone. Independent PTA. She reports that she has never smoked. She has never used smokeless tobacco. She reports that she does not drink alcohol or use drugs.          Allergies  Allergen Reactions  . Oxycodone Hcl Nausea And Vomiting  . Vicodin [Hydrocodone-Acetaminophen] Nausea And Vomiting            Medications Prior to Admission  Medication Sig Dispense Refill  . albuterol (PROVENTIL HFA;VENTOLIN HFA) 108 (90 Base) MCG/ACT inhaler Inhale 2 puffs into the lungs every 6 (six) hours as needed for wheezing or shortness of breath.       Marland Kitchen amLODipine (NORVASC) 10 MG tablet Take 1 tablet (10 mg total) by mouth daily. 31 tablet 0  . buPROPion (WELLBUTRIN XL) 300 MG 24 hr tablet Take 300 mg by mouth daily.      . hydrochlorothiazide (HYDRODIURIL) 25 MG tablet Take 25 mg by mouth daily.      . Multiple Vitamin (MULTIVITAMIN WITH MINERALS) TABS tablet Take 1 tablet by mouth daily.      . naproxen sodium (ALEVE) 220 MG tablet Take 660 mg by mouth 2 (two) times daily as needed (pain).      . pantoprazole (PROTONIX) 40 MG tablet Take 1 tablet (40 mg total) by mouth daily. For acid reflux 30 tablet 0  . phentermine (ADIPEX-P) 37.5 MG tablet Take 37.5 mg by mouth daily.      . pregabalin (LYRICA) 200 MG capsule Take 200 mg by mouth 2 (two) times a day.      Marland Kitchen tiZANidine (ZANAFLEX) 4 MG tablet Take 4-8 mg by mouth every 8 (eight) hours as needed for muscle spasms (pain).      Marland Kitchen warfarin (COUMADIN) 2.5 MG tablet TAKE 3 TABLETS BY MOUTH DAILY OR AS DIRECTED BY ANTICOAGULATION CLINIC (Patient taking differently: Take 7.5-10 mg by mouth See admin instructions. Take 7.5 mg in the morning on Mon, Tue, Thur, Fri, and Sat. Take 10 mg in the morning and Wed and Sun.) 290 tablet 1  . DULoxetine (CYMBALTA) 30 MG capsule Take 1 capsule (30 mg total) by mouth daily. (Patient not taking: Reported on 09/26/2018) 30 capsule 3  . nitroGLYCERIN (NITROSTAT) 0.4 MG SL tablet Place 1 tablet (0.4 mg total) under the tongue  every 5 (five) minutes x 3 doses as needed for chest pain. (Patient not taking: Reported on 09/26/2018) 30 tablet 4  . pentoxifylline (TRENTAL) 400 MG CR tablet TAKE 1 TABLET(400 MG) BY MOUTH THREE TIMES DAILY WITH MEALS (Patient not taking: Reported  on 09/26/2018) 270 tablet 1  . traMADol (ULTRAM) 50 MG tablet Take 1 tablet (50 mg total) by mouth every 6 (six) hours as needed for moderate pain. (Patient not taking: Reported on 09/26/2018) 30 tablet 0  . traMADol (ULTRAM) 50 MG tablet Take 1 tablet (50 mg total) by mouth every 6 (six) hours as needed for moderate pain. (Patient not taking: Reported on 09/26/2018) 30 tablet 0      Drug Regimen Review  Drug regimen was reviewed and remains appropriate with no significant issues identified   Home: Home Living Family/patient expects to be discharged to:: Private residence Living Arrangements: Alone Available Help at Discharge: Family, Available 24 hours/day Type of Home: (sister can stay with her) Home Access: Stairs to enter [employer] of Steps: 3 Entrance Stairs-Rails: Left Home Layout: One level Bathroom Shower/Tub: Chiropodist: Standard Home Equipment: None Additional Comments: Reports RW will fit into apartment, but w/c will not   Functional History: Prior Function Level of Independence: Independent Comments: Not forthcoming with details regarding PLOF when asked about work/hobbies, but also very distracted by pain during evaluation   Functional Status:  Mobility: Bed Mobility Overal bed mobility: Needs Assistance Bed Mobility: Supine to Sit Supine to sit: Supervision, HOB elevated General bed mobility comments: Use of bed rail. Increased pain due to right LE pain.  Transfers Overall transfer level: Needs assistance Equipment used: Rolling walker (2 wheeled) Transfers: Sit to/from Stand, [employer] Transfers Sit to Stand: Mod assist, From elevated surface Stand pivot transfers: Min assist  General transfer comment: Pt stood with assist for power up from bed. Verbal and tactile cues to bring chest up into standing. Pt pivoted to left side to transfer to recliner. Avoidant of right LE weight bearing. Ambulation/Gait General Gait Details: Unable due to pain   ADL: ADL Overall ADL's : Needs assistance/impaired Eating/Feeding: Independent, Sitting Grooming: Sitting, Supervision/safety Upper Body Bathing: Supervision/ safety, Sitting Lower Body Bathing: Moderate assistance, Sit to/from stand Upper Body Dressing : Supervision/safety, Sitting Lower Body Dressing: Maximal assistance, Sit to/from stand Toilet Transfer: Moderate assistance, BSC, RW, Stand-pivot Toilet Transfer Details (indicate cue type and reason): Simulated with transfer from bed to recliner General ADL Comments: Pt moving slowly due to pain.   Cognition: Cognition Overall Cognitive Status: Within Functional Limits for tasks assessed Orientation Level: Oriented X4 Cognition Arousal/Alertness: Awake/alert Behavior During Therapy: WFL for tasks assessed/performed Overall Cognitive Status: Within Functional Limits for tasks assessed General Comments: WFL although distracted by pain      Blood pressure 113/79, pulse 72, temperature 98.6 F (37 C), temperature source Oral, resp. rate 17, height 5\' 6"  (1.676 m), weight (!) 149 kg, SpO2 99 %. Physical Exam  Constitutional: She is oriented to person, place, and time. No distress.  obese  HENT:  Head: Normocephalic and atraumatic.  Eyes: Pupils are equal, round, and reactive to light. EOM are normal.  Neck: Normal range of motion. No thyromegaly present.  Cardiovascular: Normal rate and regular rhythm. Exam reveals no friction rub.  No murmur heard. Respiratory: Effort normal. No respiratory distress. She has no wheezes. She has no rales.  GI: Soft. She exhibits distension. There is no abdominal tenderness.  Musculoskeletal:        General: Edema (RLE)  present.     Comments: Right leg dressed with ACE, Vac attached, tender to touch.   Neurological: She is alert and oriented to person, place, and time. No cranial nerve deficit.  UE 5/5. RLE limited by pain/dressing but  grossly 3/5 prox to distal. LLE 3-4/5. Decreased LT right foot (where testable).   Skin: Skin is warm. She is not diaphoretic.  Psychiatric: She has a normal mood and affect. Her behavior is normal. Judgment and thought content normal.      Lab Results Last 48 Hours        Results for orders placed or performed during the hospital encounter of 09/28/18 (from the past 48 hour(s))  Protime-INR     Status: None    Collection Time: 10/04/18  2:08 AM  Result Value Ref Range    Prothrombin Time 14.8 11.4 - 15.2 seconds    INR 1.2 0.8 - 1.2      Comment: (NOTE) INR goal varies based on device and disease states. Performed at [facility], [place] [address], [place], [place] [address]    Protime-INR     Status: None    Collection Time: 10/05/18  2:08 AM  Result Value Ref Range    Prothrombin Time 15.0 11.4 - 15.2 seconds    INR 1.2 0.8 - 1.2      Comment: (NOTE) INR goal varies based on device and disease states. Performed at [facility], [place] [address] [place], [place] [address]         Imaging Results (Last 48 hours)  [place] Ekg Site Rite   Result Date: 10/05/2018 If [facility] image not attached, placement could not be confirmed due to current cardiac rhythm.            Medical Problem List and Plan: 1.  Functional deficits secondary to debility related to right leg wound with infection and associated complications             -admit to inpatient rehab 2.  H/o DVT/Antithrombotics: -DVT/anticoagulation:  Pharmaceutical: Lovenox---To hold coumadin for now due to bleeding concerns. Ortho to call back              -antiplatelet therapy: N/A 3. Pain Management: Oxycodone prn.  4. Mood: LCSW to follow for evaluation and support.               -antipsychotic agents: N/A 5. Neuropsych: This patient is  capable of making decisions on her own behalf. 6. Skin/Wound Care: Wound VAC changed to [place] today and to remain in place till 10/12/18. Will add prostat to promote wound healing.  7. Fluids/Electrolytes/Nutrition: Monitor I/O and encourage appropriate PO intake.            Check lytes in am.  8. HTN: Monitor BP bid. Continue HCTZ and Norvasc.  9. Morbid obesity: BMI 53. Educate on appropriate diet and weight loss to help promote mobility.  10. OSA: Noncompliant with CPAP 11. H/O depression: Continue Wellbutrin.     Post Admission Physician Evaluation: 1. Functional deficits secondary  to debility, RLE infection. 2. Patient is admitted to receive collaborative, interdisciplinary care between the physiatrist, rehab nursing staff, and therapy team. 3. Patient's level of medical complexity and substantial therapy needs in context of that medical necessity cannot be provided at a lesser intensity of care such as a SNF. 4. Patient has experienced substantial functional loss from his/her baseline which was documented above under the "Functional History" and "Functional Status" headings.  Judging by the patient's diagnosis, physical exam, and functional history, the patient has potential for functional progress which will result in measurable gains while on inpatient rehab.  These gains will be of substantial and practical use upon discharge  in facilitating  mobility and self-care at the household level. 5. Physiatrist will provide 24 hour management of medical needs as well as oversight of the therapy plan/treatment and provide guidance as appropriate regarding the interaction of the two. 6. The Preadmission Screening has been reviewed and patient status is unchanged unless otherwise stated above. 7. 24 hour rehab nursing will assist with bladder management, bowel management, safety, skin/wound care, disease management, medication administration,  pain management and patient education  and help integrate therapy concepts, techniques,education, etc. 8. PT will assess and treat for/with: Lower extremity strength, range of motion, stamina, balance, functional mobility, safety, adaptive techniques and equipment, pain mgt.   Goals are: mod I. 9. OT will assess and treat for/with: ADL's, functional mobility, safety, upper extremity strength, adaptive techniques and equipment, pain mgt.   Goals are: mod I. Therapy may proceed with showering this patient. 10. SLP will assess and treat for/with: n/a.  Goals are: n/a. 11. Case Management and Social Worker will assess and treat for psychological issues and discharge planning. 12. Team conference will be held weekly to assess progress toward goals and to determine barriers to discharge. 13. Patient will receive at least 3 hours of therapy per day at least 5 days per week. 14. ELOS: 10-12 days       15. Prognosis:  excellent   I have personally performed a face to face diagnostic evaluation of this patient and formulated the key components of the plan.  Additionally, I have personally reviewed laboratory data, imaging studies, as well as relevant notes and concur with the physician assistant's documentation above.  Meredith Staggers, MD, Mellody Drown     Bary Leriche, PA-C 10/05/2018

## 2018-10-05 NOTE — Progress Notes (Signed)
Occupational Therapy Evaluation Patient Details Name: Alice [name] MRN: [id number] DOB: Oct 13, 1970 Today's Date: 10/05/2018    History of Present Illness Pt is a 48 y.o. female admitted 09/28/18 with chronic RLE ulcer. S/p I&D 5/15 and 5/20 with skin graft and wound vac placement. PMH includes wears glasses/dentures, DVT/PE, morbid obesity, HTN, chronic back pain, arthritis.   Clinical Impression   PTA pt is independent with ADLs and mobility. Pt requiring mod assist for sit<>stand transfer and min assist to pivot to recliner.  She requires mod-max assist for LB ADLs. Pt is limited by pain and avoids weightbearing on right LE. Recommending CIR to further progress rehab before eventual return home with support from family. Will continue to follow acutely in order to maximize independence and safety with ADLs.    Follow Up Recommendations  CIR;Supervision/Assistance - 24 hour    Equipment Recommendations  Other (comment)(defer to next venue)    Recommendations for Other Services Rehab consult     Precautions / Restrictions Precautions Precautions: Fall Precaution Comments: RLE wound vac Restrictions Weight Bearing Restrictions: Yes RLE Weight Bearing: Weight bearing as tolerated      Mobility Bed Mobility Overal bed mobility: Needs Assistance Bed Mobility: Supine to Sit     Supine to sit: Supervision;HOB elevated     General bed mobility comments: Use of bed rail. Increased pain due to right LE pain.   Transfers Overall transfer level: Needs assistance Equipment used: Rolling walker (2 wheeled) Transfers: Sit to/from [employer] Sit to Stand: Mod assist;From elevated surface Stand pivot transfers: Min assist       General transfer comment: Pt stood with assist for power up from bed. Verbal and tactile cues to bring chest up into standing. Pt pivoted to left side to transfer to recliner. Avoidant of right LE weight bearing.    Balance                                            ADL either performed or assessed with clinical judgement   ADL Overall ADL's : Needs assistance/impaired Eating/Feeding: Independent;Sitting   Grooming: Sitting;Supervision/safety   Upper Body Bathing: Supervision/ safety;Sitting   Lower Body Bathing: Moderate assistance;Sit to/from stand   Upper Body Dressing : Supervision/safety;Sitting   Lower Body Dressing: Maximal assistance;Sit to/from stand   Toilet Transfer: Moderate assistance;BSC;RW;Stand-pivot Toilet Transfer Details (indicate cue type and reason): Simulated with transfer from bed to recliner           General ADL Comments: Pt moving slowly due to pain.     Vision   Additional Comments: Pt wears glasses. She reports that she has lost them during stay at hospital (took them to OR and hasn't seen them since). Nursing aware.     Perception     Praxis      Pertinent Vitals/Pain Pain Assessment: Faces Faces Pain Scale: Hurts whole lot Pain Location: right LE Pain Descriptors / Indicators: Sore;Grimacing;Guarding Pain Intervention(s): Monitored during session;Limited activity within patient's tolerance;Repositioned     Hand Dominance     Extremity/Trunk Assessment Upper Extremity Assessment Upper Extremity Assessment: Overall WFL for tasks assessed   Lower Extremity Assessment Lower Extremity Assessment: Defer to PT evaluation       Communication Communication Communication: No difficulties   Cognition Arousal/Alertness: Awake/alert Behavior During Therapy: WFL for tasks assessed/performed Overall Cognitive Status: Within Functional Limits for tasks assessed  General Comments       Exercises     Shoulder Instructions      Home Living Family/patient expects to be discharged to:: Private residence Living Arrangements: Alone Available Help at Discharge: Family;Available 24 hours/day Type of Home:  (sister can stay with her) Home Access: Stairs to enter [employer] of Steps: 3 Entrance Stairs-Rails: Left Home Layout: One level     Bathroom Shower/Tub: Teacher, early years/pre: Standard         Additional Comments: Reports RW will fit into apartment, but w/c will not      Prior Functioning/Environment Level of Independence: Independent                 OT Problem List: Decreased strength;Decreased activity tolerance;Impaired balance (sitting and/or standing);Decreased knowledge of use of DME or AE;Pain      OT Treatment/Interventions: Self-care/ADL training;DME and/or AE instruction;Therapeutic exercise;Therapeutic activities;Patient/family education;Balance training    OT Goals(Current goals can be found in the care plan section) Acute Rehab OT Goals Patient Stated Goal: to decrease pain OT Goal Formulation: With patient Time For Goal Achievement: 10/19/18 Potential to Achieve Goals: Good  OT Frequency: Min 2X/week   Barriers to D/C:            Co-evaluation              AM-PAC OT "6 Clicks" Daily Activity     Outcome Measure Help from another person eating meals?: None Help from another person taking care of personal grooming?: None Help from another person toileting, which includes using toliet, bedpan, or urinal?: A Lot Help from another person bathing (including washing, rinsing, drying)?: A Lot Help from another person to put on and taking off regular upper body clothing?: A Little Help from another person to put on and taking off regular lower body clothing?: A Lot 6 Click Score: 17   End of Session Equipment Utilized During Treatment: Gait belt;Rolling walker Nurse Communication: Mobility status  Activity Tolerance: Patient tolerated treatment well Patient left: in chair;with call bell/phone within reach(with CIR admissions coordinator)  OT Visit Diagnosis: Unsteadiness on feet (R26.81);Pain;Other abnormalities of gait  and mobility (R26.89) Pain - Right/Left: Right Pain - part of body: Leg                Time: 0354-6568 OT Time Calculation (min): 24 min Charges:  OT General Charges $OT Visit: 1 Visit OT Evaluation $OT Eval Moderate Complexity: 1 Mod OT Treatments $Self Care/Home Management : 8-22 mins    Alice [name], [place] [phone] 10/05/2018, 10:36 AM

## 2018-10-05 NOTE — Progress Notes (Signed)
Patient discharged to inpatient rehab. Patient report phoned to [facility]. Patient belongings and AVS summary transported with patient via wheelchair to inpatient rehab. Patient wound vac in place, switched to prevena wound vac. Patient  transported off unit by Nurse and nurse tech.

## 2018-10-05 NOTE — Progress Notes (Signed)
Inpatient Rehab Admissions:  Inpatient Rehab Consult received.  I met with patient at the bedside for rehabilitation assessment and to discuss goals and expectations of an inpatient rehab admission.  She is agreeable to come to CIR and I have approval from [place], [place].  I will plan for admission later today.   Signed: Shann Medal, PT, DPT Admissions Coordinator [phone] 10/05/18  10:31 AM

## 2018-10-05 NOTE — Plan of Care (Signed)

## 2018-10-05 NOTE — Progress Notes (Signed)
Pt was admitted to 5N13 approx 1520 via WC and belongings. Pt oriented to unit and safety precautions. Transferred to bed with 1 assist. Resting in bed with call bell in reach. Bed alarm on. Will cont to monitor.   Rosita Fire, RN

## 2018-10-06 ENCOUNTER — Inpatient Hospital Stay (HOSPITAL_COMMUNITY): Payer: Medicare Other | Admitting: Occupational Therapy

## 2018-10-06 ENCOUNTER — Inpatient Hospital Stay (HOSPITAL_COMMUNITY): Payer: Medicare Other | Admitting: Physical Therapy

## 2018-10-06 DIAGNOSIS — I739 Peripheral vascular disease, unspecified: Secondary | ICD-10-CM

## 2018-10-06 DIAGNOSIS — I10 Essential (primary) hypertension: Secondary | ICD-10-CM

## 2018-10-06 LAB — URINE CULTURE: Culture: 20000 — AB

## 2018-10-06 NOTE — Evaluation (Signed)
Physical Therapy Assessment and Plan  Patient Details  Name: Alice [name] MRN: [id number] Date of Birth: November 12, 1970  PT Diagnosis: Dizziness and giddiness, Muscle weakness and Pain in RLE Rehab Potential: Good ELOS: 7-10 days    Today's Date: 10/06/2018 PT Individual Time: 0805-0900 PT Individual Time Calculation (min): 55 min    Problem List:  Patient Active Problem List   Diagnosis Date Noted  . Debility 10/05/2018  . Chronic ulcer of right leg, with fat layer exposed ([place]) 09/28/2018  . Chronic ulcer of leg, right, with necrosis of muscle ([place])   . Iron deficiency anemia 01/29/2018  . Pulmonary embolus ([employer]) 01/09/2018  . Encounter for therapeutic drug monitoring 01/09/2018  . Severe malnutrition ([place]) 08/24/2017  . Acute kidney injury superimposed on CKD ([place]) 12/28/2016  . Hx of skin graft 08/01/2016  . 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 ([place]) 07/25/2016  . 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 ([place]) 07/05/2016  . Trichomonal infection 11/24/2015  . Abdominal pain 05/02/2015  . Symptomatic cholelithiasis 04/16/2015  . Acute bilateral upper abdominal pain 04/16/2015  . Microcytic anemia 05/18/2013  . Hypotension, unspecified 05/17/2013  . CKD (chronic kidney disease), stage III ([place]) 05/17/2013  . Hypokalemia 05/17/2013  . Anal fissure 02/06/2013  . Anal skin tag 02/06/2013  . ALLERGIC RHINITIS 03/05/2010  . LOW BACK PAIN SYNDROME 12/13/2007  . Local infection of skin and subcutaneous tissue 10/10/2007  . OBESITY, MORBID 12/02/2006  . HTN (hypertension) 12/02/2006  . History of pulmonary embolism 12/02/2006  . History of DVT (deep vein thrombosis) 12/02/2006  . 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 GASTROESOPHAGEAL REFLUX DISEASE 12/02/2006  . OSA (obstructive sleep apnea) 12/02/2006    Past Medical History:  Past Medical History:  Diagnosis Date  . Anemia   . Anxiety    . Arthritis   . Chronic bronchitis ([place])   . Chronic upper back pain   . Depression   . DVT (deep venous thrombosis) ([facility])    BLE  . GERD (gastroesophageal reflux disease)   . Headache    "weekly" (04/16/2015)  . Hypertension   . Migraine    "monthly" (04/16/2015)  . Morbid obesity with BMI of 50.0-59.9, adult ([place])   . Obesity   . Pneumonia 2014  . Pulmonary embolism ([place])   . Sinusitis nasal   . Sleep apnea    "I'm suppose to wear a mask but I don't" (04/16/2015)  . Varicose veins of right lower extremity   . Venous stasis of lower extremity    right  . Wears dentures   . Wears glasses    Past Surgical History:  Past Surgical History:  Procedure Laterality Date  . CHOLECYSTECTOMY N/A 04/18/2015   Procedure: LAPAROSCOPIC CHOLECYSTECTOMY;  Surgeon: Ralene Ok, MD;  Location: [facility];  Service: General;  Laterality: N/A;  . HYSTEROSCOPY W/D&C  12/24/2001   Archie Endo 09/28/2010  . I&D EXTREMITY Right 07/25/2016   Procedure: IRRIGATION AND DEBRIDEMENT RIGHT LEG ULCER, APPLY VERAFLO VAC;  Surgeon: [name], MD;  Location: [facility];  Service: Orthopedics;  Laterality: Right;  . I&D EXTREMITY Right 09/28/2018   Procedure: DEBRIDEMENT RIGHT LOWER LEG WITH PLACEMENT WITH PLACEMENT OF SKIN GRAFT AND VAC;  Surgeon: [name], MD;  Location: [facility];  Service: Orthopedics;  Laterality: Right;  . I&D EXTREMITY Right 10/03/2018   Procedure: REPEAT IRRIGATION AND DEBRIDEMENT RIGHT LOWER LEG, VAC PLACEMENT;  Surgeon: [name], MD;  Location: [facility];  Service: Orthopedics;  Laterality: Right;  . INCISE AND DRAIN ABCESS Right 07/14/2016  . INCISION AND DRAINAGE Right 09/10/2008   leg:  skin and soft tissue and muscle/notes 09/15/2010  . INCISION AND DRAINAGE Right 01/01/2008   Chronic venous stasis insufficiency ulcer,/notes 09/14/2010/  . INCISION AND DRAINAGE Right 08/9177   calf w/application wound vac/notes 07/20/2010  . INCISION AND DRAINAGE Right 07/25/2016   IRRIGATION AND DEBRIDEMENT  RIGHT LEG ULCER,  . LAPAROSCOPIC GASTRIC BYPASS  ~ 2007  . MULTIPLE TOOTH EXTRACTIONS    . SKIN GRAFT SPLIT THICKNESS LEG / FOOT Right 07/25/2016   LEG  . SKIN SPLIT GRAFT Right 07/27/2016   Procedure: SKIN GRAFT RIGHT LEG WITH THERASKIN APPLICATION;  Surgeon: [name], MD;  Location: [facility];  Service: Orthopedics;  Laterality: Right;  . TONSILLECTOMY      Assessment & Plan Clinical Impression: Patient is a 48 year old female with history of HTN, GERD, DVT, morbid obesity, chronic RLE ulcers with drainage and conservative therapy. She was admitted on 5/15 and underwent I &D by Dr. Sharol Given and placed on IV antibiotics. She was taken back to OR on 5/20 for  I &D,placement of A cell and wound VAC placed. Wound cultures positive for proteus mirabilis, staph Caprae and Serratia Marscens. ID consulted following for input as wound showing great improvement, antibiotics discontinued today. Therapy ongoing and patient noted to be debiliataed, has pain limiting WB on RLE as well as anxiety affecting mobility and ADLs  Patient transferred to CIR on 10/05/2018 .   Patient currently requires min with mobility secondary to muscle weakness and muscle joint tightness, decreased cardiorespiratoy endurance and decreased standing balance, decreased balance strategies and difficulty maintaining precautions.  Prior to hospitalization, patient was independent  with mobility and lived with   in a (sister can stay with her) home.  Home access is 4Stairs to enter.  Patient will benefit from skilled PT intervention to maximize safe functional mobility, minimize fall risk and decrease caregiver burden for planned discharge home with intermittent assist.  Anticipate patient will benefit from follow up [facility] at discharge.  PT - End of Session Activity Tolerance: Tolerates < 10 min activity, no significant change in vital signs Endurance Deficit: Yes PT Assessment Rehab Potential (ACUTE/IP ONLY): Good PT Barriers to  Discharge: [place] home environment;Decreased caregiver support;Medical stability;Home environment access/layout;Weight bearing restrictions;Medication compliance PT Patient demonstrates impairments in the following area(s): Balance;Edema;Endurance;Nutrition;Pain;Safety;Skin Integrity PT Transfers Functional Problem(s): Bed Mobility;Bed to Chair;Car;Floor;Furniture PT Locomotion Functional Problem(s): Ambulation;Stairs;Wheelchair Mobility PT Plan PT Intensity: Minimum of 1-2 x/day ,45 to 90 minutes PT Frequency: 5 out of 7 days PT Duration Estimated Length of Stay: 7-10 days  PT Treatment/Interventions: Pain management;Ambulation/gait training;Community reintegration;Balance/vestibular training;Cognitive remediation/compensation;Disease management/prevention;Discharge planning;DME/adaptive equipment instruction;Neuromuscular re-education;Functional mobility training;Patient/family education;Psychosocial support;Skin care/wound management;Splinting/orthotics;Stair training;Therapeutic Activities;Therapeutic Exercise;UE/LE Strength taining/ROM;UE/LE Coordination activities;Visual/perceptual remediation/compensation;Wheelchair propulsion/positioning PT Transfers Anticipated Outcome(s): Supervision assist with LRAD  PT Locomotion Anticipated Outcome(s): Ambulatory at household level with supervision assist  PT Recommendation Recommendations for Other Services: Therapeutic Recreation consult Therapeutic Recreation Interventions: Stress management Follow Up Recommendations: Home health PT Patient destination: Home Equipment Recommended: Wheelchair cushion (measurements);Wheelchair (measurements);Rolling walker with 5" wheels(wide RW )  Skilled Therapeutic Intervention Pt received supine in bed and agreeable to PT. Supine>sit transfer with supervision assist and min cues for decreased use of rails as possible. PT obtained 24inch wide WC and wide RW. PT instructed patient in PT Evaluation and  initiated treatment intervention; see below for results. PT educated  patient in POC, rehab potential, rehab goals, and discharge recommendations. Pt performed gait training, WC mobility, and stair management as listed below. Only mild increase in RLE pain with mobility on this day. Patient returned to room and left sitting in [facility] with call bell in reach and all needs met.       PT Evaluation Precautions/Restrictions Precautions Precautions: Fall Precaution Comments: RLE wound vac Restrictions Weight Bearing Restrictions: Yes RLE Weight Bearing: Weight bearing as tolerated General   Vital Signs Pain Pain Assessment Pain Scale: 0-10 Pain Score: 0-No pain Faces Pain Scale: No hurt Pain Type: Acute pain;Surgical pain Pain Location: Leg Pain Orientation: Right Pain Descriptors / Indicators: Throbbing Patients Stated Pain Goal: 6 Pain Intervention(s): Medication (See eMAR) Home Living/Prior Functioning Home Living Available Help at Discharge: Family;Available 24 hours/day Type of Home: (sister can stay with her) Home Access: Stairs to enter [employer] of Steps: 4 Entrance Stairs-Rails: Left Home Layout: One level Bathroom Shower/Tub: Chiropodist: Standard Additional Comments: Reports RW will fit into apartment, but w/c will not Prior Function Level of Independence: Independent with basic ADLs;Independent with homemaking with ambulation;Independent with gait  Able to Take Stairs?: Yes Driving: Yes Vocation: On disability Vision/Perception  Vision - Assessment Additional Comments: wears glasses, but reports that they were lost while at the hospital  Perception Perception: Within Functional Limits Praxis Praxis: Intact  Cognition Overall Cognitive Status: Within Functional Limits for tasks assessed Arousal/Alertness: Awake/alert Orientation Level: Oriented X4 Sensation Sensation Light Touch: Appears Intact Proprioception: Appears  Intact Coordination Gross Motor Movements are Fluid and Coordinated: Yes Fine Motor Movements are Fluid and Coordinated: Yes Finger Nose Finger Test: [facility] Heel Shin Test: limited by body habitus  Motor  Motor Motor: Within Functional Limits  Mobility Bed Mobility Bed Mobility: Rolling Right;Rolling Left;Supine to Sit;Sit to Supine Rolling Right: Supervision/verbal cueing Rolling Left: Supervision/Verbal cueing Supine to Sit: Supervision/Verbal cueing Sit to Supine: Minimal Assistance - Patient > 75% Transfers Transfers: Sit to Stand;Stand Pivot Transfers Sit to Stand: Minimal Assistance - Patient > 75% Stand Pivot Transfers: Minimal Assistance - Patient > 75% Stand Pivot Transfer Details: Verbal cues for technique;Verbal cues for precautions/safety;Verbal cues for gait pattern Transfer (Assistive device): Rolling walker Locomotion  Gait Ambulation: Yes Gait Assistance: Minimal Assistance - Patient > 75% Gait Distance (Feet): 35 Feet Assistive device: Rolling walker Gait Gait: Yes Gait Pattern: Antalgic;Right steppage Stairs / Additional Locomotion Stairs: Yes Stairs Assistance: Minimal Assistance - Patient > 75% Stair Management Technique: Two rails Number of Stairs: 3 Height of Stairs: 3 Ramp: Minimal Assistance - Patient >75% Wheelchair Mobility Wheelchair Mobility: Yes Wheelchair Assistance: Chartered loss adjuster: Both upper extremities Wheelchair Parts Management: Needs assistance Distance: 150  Trunk/Postural Assessment  Cervical Assessment Cervical Assessment: Within Functional Limits Thoracic Assessment Thoracic Assessment: Within Functional Limits Lumbar Assessment Lumbar Assessment: Exceptions to WFL(anterior tilted pelvis ) Postural Control Postural Control: Within Functional Limits  Balance Balance Balance Assessed: Yes Static Sitting Balance Static Sitting - Level of Assistance: 6: Modified independent (Device/Increase  time) Dynamic Sitting Balance Dynamic Sitting - Level of Assistance: 5: Stand by assistance Static Standing Balance Static Standing - Level of Assistance: 5: Stand by assistance Dynamic Standing Balance Dynamic Standing - Level of Assistance: 4: Min assist Extremity Assessment      RLE Assessment RLE Assessment: Exceptions to [facility] General Strength Comments: hip grossly 4/5. knee flexion and extension grossly 4-/5. ankle at least 3/5. pain limitingt knee and ankle MMT.  LLE Assessment LLE Assessment: Within Functional  Limits General Strength Comments: grossly 4/5 to 4+/5 proximal to distal     Refer to Care Plan for Long Term Goals  Recommendations for other services: Therapeutic Recreation  Stress management  Discharge Criteria: Patient will be discharged from PT if patient refuses treatment 3 consecutive times without medical reason, if treatment goals not met, if there is a change in medical status, if patient makes no progress towards goals or if patient is discharged from hospital.  The above assessment, treatment plan, treatment alternatives and goals were discussed and mutually agreed upon: by patient  [name] 10/06/2018, 9:01 AM

## 2018-10-06 NOTE — Progress Notes (Signed)
Physical Therapy Session Note  Patient Details  Name: Alice [name] MRN: [id number] Date of Birth: 1971-04-24  Today's Date: 10/06/2018 PT Individual Time: 4098-1191 PT Individual Time Calculation (min): 55 min   Short Term Goals: Week 1:  PT Short Term Goal 1 (Week 1): STG=LTG due to ELOS  Skilled Therapeutic Interventions/Progress Updates:   Pt received supine in bed and agreeable to PT. Supine>sit transfer with supervisoin assist min cues for posture.  Pt reports need for urination. ambulatory transfer to bathroom with RW and CGA for safety. Supervision assist for sit<>stand form toilet and pt able to manage clothing and perform personal hygiene.    Patient demonstrates increased fall risk as noted by score of   31/56 on Berg Balance Scale.  (<36= high risk for falls, close to 100%; 37-45 significant >80%; 46-51 moderate >50%; 52-55 lower >25%)  Gait training with RW x 35f with CGA-supervision assist from PT for safety. Antalgic gait pattern then was exacerbated with increased distance.   WC mobility 2 x 821fwith supervision assist with min cues for turning technique in tigh space and hospital room.   BLE therex instructed by PT seated in WC. LAQ, reciprocal hip flexion, calf raises, hip abduction, hip adduction, heel slides. All completed x 12 BLE with cues for full ROM, decreased eccentric speed and decreased compensatory movements    Patient returned to room and [facility] call bell in reach and all needs met.           Therapy Documentation Precautions:  Precautions Precautions: Fall Precaution Comments: RLE wound vac Restrictions Weight Bearing Restrictions: Yes RLE Weight Bearing: Weight bearing as tolerated Vital Signs: Therapy Vitals Temp: 98.3 F (36.8 C) Temp Source: Oral Pulse Rate: 94 Resp: 18 BP: (!) 83/67 Patient Position (if appropriate): Sitting Oxygen Therapy SpO2: 100 % O2 Device: Room Air Pain: Pain Assessment Pain Scale:  0-10 Pain Score: 2  Pain Location: Leg Pain Intervention(s): Repositioned Mobility: Transfers Sit to Stand: Contact Guard/Touching assist Stand Pivot Transfers: Contact Guard/Touching assist Stand Pivot Transfer Details: Verbal cues for precautions/safety;Verbal cues for safe use of DME/AE Transfer (Assistive device): Rolling walker  Balance: Balance Balance Assessed: Yes Standardized Balance Assessment Standardized Balance Assessment: Berg Balance Test Berg Balance Test Sit to Stand: Able to stand  independently using hands Standing Unsupported: Able to stand safely 2 minutes Sitting with Back Unsupported but Feet Supported on Floor or Stool: Able to sit safely and securely 2 minutes Stand to Sit: Controls descent by using hands Transfers: Able to transfer safely, definite need of hands Standing Unsupported with Eyes Closed: Able to stand 3 seconds Standing Ubsupported with Feet Together: Needs help to attain position and unable to hold for 15 seconds From Standing, Reach Forward with Outstretched Arm: Can reach forward >12 cm safely (5") From Standing Position, Pick up Object from Floor: Able to pick up shoe, needs supervision From Standing Position, Turn to Look Behind Over each Shoulder: Looks behind one side only/other side shows less weight shift Turn 360 Degrees: Needs assistance while turning Standing Unsupported, Alternately Place Feet on Step/Stool: Needs assistance to keep from falling or unable to try(UE support on RW ) Standing Unsupported, One Foot in Front: Able to take small step independently and hold 30 seconds Standing on One Leg: Tries to lift leg/unable to hold 3 seconds but remains standing independently Total Score: 31 Dynamic Sitting Balance Dynamic Sitting - Level of Assistance: 5: Stand by assistance Static Standing Balance Static Standing -  Level of Assistance: 5: Stand by assistance Dynamic Standing Balance Dynamic Standing - Level of Assistance: 4:  Min assist    Therapy/Group: Individual Therapy  [name] 10/06/2018, 3:19 PM

## 2018-10-06 NOTE — Progress Notes (Signed)
[facility] PHYSICAL MEDICINE & REHABILITATION PROGRESS NOTE   Subjective/Complaints: Feels like she needs to use the bedpan no abdominal pain no nausea Review of systems denies chest pain shortness of breath nausea vomiting diarrhea or constipation   Objective:   [place] Ekg Site Rite  Result Date: 10/05/2018 If Site Rite image not attached, placement could not be confirmed due to current cardiac rhythm.  No results for input(s): WBC, HGB, HCT, PLT in the last 72 hours. No results for input(s): NA, K, CL, CO2, GLUCOSE, BUN, CREATININE, CALCIUM in the last 72 hours.  Intake/Output Summary (Last 24 hours) at 10/06/2018 0740 Last data filed at 10/05/2018 1745 Gross per 24 hour  Intake 240 ml  Output -  Net 240 ml     Physical Exam: Vital Signs Blood pressure 115/66, pulse (!) 107, temperature 98.1 F (36.7 C), temperature source Oral, resp. rate 19, height 5\' 8"  (1.727 m), weight (!) 143.2 kg, SpO2 94 %.     Assessment/Plan: 1. Functional deficits secondary to debility peripheral vascular disease which require 3+ hours per day of interdisciplinary therapy in a comprehensive inpatient rehab setting.  Physiatrist is providing close team supervision and 24 hour management of active medical problems listed below.  Physiatrist and rehab team continue to assess barriers to discharge/monitor patient progress toward functional and medical goals  Care Tool:  Bathing              Bathing assist       Upper Body Dressing/Undressing Upper body dressing   What is the patient wearing?: Hospital gown only    Upper body assist Assist Level: Minimal Assistance - Patient > 75%    Lower Body Dressing/Undressing Lower body dressing            Lower body assist       Toileting Toileting    Toileting assist Assist for toileting: Maximal Assistance - Patient 25 - 49%     Transfers Chair/bed transfer  Transfers assist     Chair/bed transfer assist level: Moderate  Assistance - Patient 50 - 74%     Locomotion Ambulation   Ambulation assist              Walk 10 feet activity   Assist           Walk 50 feet activity   Assist           Walk 150 feet activity   Assist           Walk 10 feet on uneven surface  activity   Assist           Wheelchair     Assist               Wheelchair 50 feet with 2 turns activity    Assist            Wheelchair 150 feet activity     Assist          Medical Problem List and Plan: 1.Functional deficitssecondary to debility related to right leg wound with infection and associated complications CIR PT OT evaluations 2.H/o DVT/Antithrombotics: -DVT/anticoagulation:Pharmaceutical:Lovenox---To hold coumadin for now due to bleeding concerns. Ortho to call back -antiplatelet therapy: N/A 3. Pain Management:Oxycodone prn. 4. Mood:LCSW to follow for evaluation and support. -antipsychotic agents: N/A 5. Neuropsych: This patientiscapable of making decisions on herown behalf. 6. Skin/Wound Care:Wound VAC changed to [place] today and to remain in place till 10/12/18. Will add prostat to promote wound healing.  7. Fluids/Electrolytes/Nutrition:Monitor I/Oand encourage appropriate PO intake.Check lytes in am.  8. HTN: Monitor BP bid. Continue HCTZ and Norvasc.  Vitals:   10/05/18 1945 10/06/18 0304  BP: (!) 111/58 115/66  Pulse: 97 (!) 107  Resp: 18 19  Temp: 98.8 F (37.1 C) 98.1 F (36.7 C)  SpO2: 100% 94%  Good control, mild tachycardia may be deconditioning continue to monitor 9. Morbid obesity: BMI 53. Educate on appropriate diet and weight loss to help promote mobility.  10. OSA: Noncompliant with CPAP 11. H/O depression: Continue Wellbutrin.    LOS: 1 days A FACE TO FACE EVALUATION WAS PERFORMED  Charlett Blake 10/06/2018, 7:40 AM

## 2018-10-06 NOTE — Evaluation (Signed)
Occupational Therapy Assessment and Plan  Patient Details  Name: Alice [name] MRN: [id number] Date of Birth: 1970/05/24  OT Diagnosis: muscle weakness (generalized) Rehab Potential: Rehab Potential (ACUTE ONLY): Excellent ELOS: 5-7 days   Today's Date: 10/06/2018 OT Individual Time: 9983-3825 OT Individual Time Calculation (min): 73 min     Problem List:  Patient Active Problem List   Diagnosis Date Noted  . Debility 10/05/2018  . Chronic ulcer of right leg, with fat layer exposed ([place]) 09/28/2018  . Chronic ulcer of leg, right, with necrosis of muscle ([place])   . Iron deficiency anemia 01/29/2018  . Pulmonary embolus ([place]) 01/09/2018  . Encounter for therapeutic drug monitoring 01/09/2018  . Severe malnutrition ([place]) 08/24/2017  . Acute kidney injury superimposed on CKD ([place]) 12/28/2016  . Hx of skin graft 08/01/2016  . 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 ([place]) 07/25/2016  . 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 ([place]) 07/05/2016  . Trichomonal infection 11/24/2015  . Abdominal pain 05/02/2015  . Symptomatic cholelithiasis 04/16/2015  . Acute bilateral upper abdominal pain 04/16/2015  . Microcytic anemia 05/18/2013  . Hypotension, unspecified 05/17/2013  . CKD (chronic kidney disease), stage III ([place]) 05/17/2013  . Hypokalemia 05/17/2013  . Anal fissure 02/06/2013  . Anal skin tag 02/06/2013  . ALLERGIC RHINITIS 03/05/2010  . LOW BACK PAIN SYNDROME 12/13/2007  . Local infection of skin and subcutaneous tissue 10/10/2007  . OBESITY, MORBID 12/02/2006  . HTN (hypertension) 12/02/2006  . History of pulmonary embolism 12/02/2006  . History of DVT (deep vein thrombosis) 12/02/2006  . 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 GASTROESOPHAGEAL REFLUX DISEASE 12/02/2006  . OSA (obstructive sleep apnea) 12/02/2006    Past Medical History:  Past Medical History:  Diagnosis Date  . Anemia    . Anxiety   . Arthritis   . Chronic bronchitis ([place])   . Chronic upper back pain   . Depression   . DVT (deep venous thrombosis) ([facility])    BLE  . GERD (gastroesophageal reflux disease)   . Headache    "weekly" (04/16/2015)  . Hypertension   . Migraine    "monthly" (04/16/2015)  . Morbid obesity with BMI of 50.0-59.9, adult ([place])   . Obesity   . Pneumonia 2014  . Pulmonary embolism ([place])   . Sinusitis nasal   . Sleep apnea    "I'm suppose to wear a mask but I don't" (04/16/2015)  . Varicose veins of right lower extremity   . Venous stasis of lower extremity    right  . Wears dentures   . Wears glasses    Past Surgical History:  Past Surgical History:  Procedure Laterality Date  . CHOLECYSTECTOMY N/A 04/18/2015   Procedure: LAPAROSCOPIC CHOLECYSTECTOMY;  Surgeon: Ralene Ok, MD;  Location: [facility];  Service: General;  Laterality: N/A;  . HYSTEROSCOPY W/D&C  12/24/2001   Archie Endo 09/28/2010  . I&D EXTREMITY Right 07/25/2016   Procedure: IRRIGATION AND DEBRIDEMENT RIGHT LEG ULCER, APPLY VERAFLO VAC;  Surgeon: [name], MD;  Location: [facility];  Service: Orthopedics;  Laterality: Right;  . I&D EXTREMITY Right 09/28/2018   Procedure: DEBRIDEMENT RIGHT LOWER LEG WITH PLACEMENT WITH PLACEMENT OF SKIN GRAFT AND VAC;  Surgeon: [name], MD;  Location: [facility];  Service: Orthopedics;  Laterality: Right;  . I&D EXTREMITY Right 10/03/2018   Procedure: REPEAT IRRIGATION AND DEBRIDEMENT RIGHT LOWER LEG, VAC PLACEMENT;  Surgeon: [name], MD;  Location:  [facility];  Service: Orthopedics;  Laterality: Right;  . INCISE AND DRAIN ABCESS Right 07/14/2016  . INCISION AND DRAINAGE Right 09/10/2008   leg:  skin and soft tissue and muscle/notes 09/15/2010  . INCISION AND DRAINAGE Right 01/01/2008   Chronic venous stasis insufficiency ulcer,/notes 09/14/2010/  . INCISION AND DRAINAGE Right 07/8935   calf w/application wound vac/notes 07/20/2010  . INCISION AND DRAINAGE Right 07/25/2016   IRRIGATION AND  DEBRIDEMENT RIGHT LEG ULCER,  . LAPAROSCOPIC GASTRIC BYPASS  ~ 2007  . MULTIPLE TOOTH EXTRACTIONS    . SKIN GRAFT SPLIT THICKNESS LEG / FOOT Right 07/25/2016   LEG  . SKIN SPLIT GRAFT Right 07/27/2016   Procedure: SKIN GRAFT RIGHT LEG WITH THERASKIN APPLICATION;  Surgeon: [name], MD;  Location: [facility];  Service: Orthopedics;  Laterality: Right;  . TONSILLECTOMY      Assessment & Plan Clinical Impression: Patient is a 48 y.o. year old female with history of HTN, GERD, DVT, morbid obesity, chronic RLE ulcers with drainage and conservative therapy. She was admitted on 5/15 and underwent I &D by Dr. Sharol Given and placed on IV antibiotics. She was taken back to OR on 5/20 for  I &D,placement of A cell and wound VAC placed. Wound cultures positive for proteus mirabilis, staph Caprae and Serratia Marscens. ID consulted following for input as wound showing great improvement, antibiotics discontinued today. Therapy ongoing and patient noted to be debiliataed, has pain limiting WB on RLE as well as anxiety affecting mobility and ADLs.  Patient transferred to CIR on 10/05/2018 .    Patient currently requires min with basic self-care skills secondary to decreased cardiorespiratoy endurance and decreased standing balance.  Prior to hospitalization, patient could complete adl with independent .  Patient will benefit from skilled intervention to decrease level of assist with basic self-care skills and increase independence with basic self-care skills prior to discharge home with care partner.  Anticipate patient will require intermittent supervision and no further OT follow recommended.  OT - End of Session Activity Tolerance: Tolerates 10 - 20 min activity with multiple rests Endurance Deficit: Yes Endurance Deficit Description: fatigue with adl and short distance ambulation OT Assessment Rehab Potential (ACUTE ONLY): Excellent OT Patient demonstrates impairments in the following area(s):  Balance;Endurance OT Basic ADL's Functional Problem(s): Bathing;Dressing;Toileting OT Advanced ADL's Functional Problem(s): Simple Meal Preparation;Light Housekeeping OT Transfers Functional Problem(s): Toilet;Tub/Shower OT Plan OT Intensity: Minimum of 1-2 x/day, 45 to 90 minutes OT Frequency: 5 out of 7 days OT Duration/Estimated Length of Stay: 5-7 days OT Treatment/Interventions: Balance/vestibular training;Discharge planning;Self Care/advanced ADL retraining;Therapeutic Activities;Functional mobility training;Patient/family education;Skin care/wound managment;DME/adaptive equipment instruction OT Self Feeding Anticipated Outcome(s): independent OT Basic Self-Care Anticipated Outcome(s): mod I OT Toileting Anticipated Outcome(s): mod I OT Bathroom Transfers Anticipated Outcome(s): CS/mod I OT Recommendation Patient destination: Home Follow Up Recommendations: None Equipment Recommended: Tub/shower bench   Skilled Therapeutic Intervention Patient seated in the w/c and ready for therapy session.  She notes that her pain is currently under control and that she has been dealing with her Right LE wounds for an extended period of time.  Evaluation completed as documented below.  Reviewed role of OT, therapy schedule, goals and plan of care.  She demonstrates good understanding and safety t/o session.  ADL completed at a w/c level as she declined a shower today.  She is able to bathe all body parts (did not remove wound dressings) with set up, CGA in stance.  Dressing completed at a w/c level with  set up, CGA in stance.  Toilet transfer with CGA, toileting with CGA.  Short distance ambulation with RW CGA and noted significant fatigue afterward.  She completed core, UB and LB exercises at the w/c level.  Patient remained in w/c at close of session with LEs supported by leg rests and call bell in reach.    OT Evaluation Precautions/Restrictions  Precautions Precautions: Fall Precaution Comments:  RLE wound vac Restrictions Weight Bearing Restrictions: Yes RLE Weight Bearing: Weight bearing as tolerated General Chart Reviewed: Yes Vital Signs Therapy Vitals Pulse Rate: 86 BP: 104/88 Patient Position (if appropriate): Sitting Oxygen Therapy O2 Device: Room Air Pain Pain Assessment Pain Scale: 0-10 Pain Score: 2  Pain Type: Surgical pain Pain Location: Leg Pain Orientation: Right Pain Onset: With Activity Pain Intervention(s): Repositioned Home Living/Prior Functioning Home Living Family/patient expects to be discharged to:: Private residence Living Arrangements: Alone Available Help at Discharge: Family, Available 24 hours/day Type of Home: Apartment Home Access: Stairs to enter [employer] of Steps: 3 Entrance Stairs-Rails: Left Home Layout: One level Bathroom Shower/Tub: Chiropodist: Standard Additional Comments: Reports RW will fit into apartment, but w/c will not  Lives With: Alone Prior Function Level of Independence: Independent with basic ADLs, Independent with homemaking with ambulation, Independent with gait  Able to Take Stairs?: Yes Driving: Yes Vocation: On disability ADL ADL Eating: Independent Where Assessed-Eating: Chair Grooming: Independent Where Assessed-Grooming: Sitting at sink Upper Body Bathing: Setup Where Assessed-Upper Body Bathing: Sitting at sink Lower Body Bathing: Contact guard Where Assessed-Lower Body Bathing: Standing at sink, Sitting at sink Upper Body Dressing: Setup Where Assessed-Upper Body Dressing: Wheelchair Lower Body Dressing: Contact guard Where Assessed-Lower Body Dressing: Wheelchair Toileting: Contact guard Where Assessed-Toileting: Glass blower/designer: Close supervision Toilet Transfer Method: Arts development officer: Energy manager: Not assessed Vision Baseline Vision/History: Wears glasses Wears Glasses: At all times(glasses lost  currently) Patient Visual Report: No change from baseline Vision Assessment?: No apparent visual deficits Perception  Perception: Within Functional Limits Praxis Praxis: Intact Cognition Overall Cognitive Status: Within Functional Limits for tasks assessed Arousal/Alertness: Awake/alert Orientation Level: Person;Place;Situation Person: Oriented Place: Oriented Situation: Oriented Year: 2020 Month: May Day of Week: Correct Memory: Appears intact Immediate Memory Recall: Sock;Blue;Bed Memory Recall: Sock;Blue;Bed Memory Recall Sock: Without Cue Memory Recall Blue: Without Cue Memory Recall Bed: Without Cue Attention: Focused;Sustained Focused Attention: Appears intact Sustained Attention: Appears intact Awareness: Appears intact Problem Solving: Appears intact Safety/Judgment: Appears intact Sensation Sensation Light Touch: Appears Intact Proprioception: Appears Intact Coordination Gross Motor Movements are Fluid and Coordinated: Yes Fine Motor Movements are Fluid and Coordinated: Yes Finger Nose Finger Test: [facility] Heel Shin Test: limited by body habitus  Motor  Motor Motor: Within Functional Limits Mobility  Bed Mobility Bed Mobility: Rolling Right;Rolling Left;Supine to Sit;Sit to Supine Rolling Right: Supervision/verbal cueing Rolling Left: Supervision/Verbal cueing Supine to Sit: Supervision/Verbal cueing Sit to Supine: Minimal Assistance - Patient > 75% Transfers Sit to Stand: Contact Guard/Touching assist  Trunk/Postural Assessment  Postural Control Postural Control: Within Functional Limits  Balance Dynamic Sitting Balance Dynamic Sitting - Level of Assistance: 5: Stand by assistance Static Standing Balance Static Standing - Level of Assistance: 5: Stand by assistance Dynamic Standing Balance Dynamic Standing - Level of Assistance: 4: Min assist Extremity/Trunk Assessment RUE Assessment RUE Assessment: Within Functional Limits General Strength  Comments: 5/5 LUE Assessment LUE Assessment: Within Functional Limits General Strength Comments: 5/5     Refer to Care Plan for Long Term Goals  Recommendations for other services: None    Discharge Criteria: Patient will be discharged from OT if patient refuses treatment 3 consecutive times without medical reason, if treatment goals not met, if there is a change in medical status, if patient makes no progress towards goals or if patient is discharged from hospital.  The above assessment, treatment plan, treatment alternatives and goals were discussed and mutually agreed upon: by patient  Carlos Levering 10/06/2018, 12:48 PM

## 2018-10-06 NOTE — Progress Notes (Signed)
Inpatient Rehabilitation  Patient information reviewed and entered into eRehab system by [facility]. Loni Beckwith., CCC/SLP, PPS Coordinator.  Information including medical coding, functional ability and quality indicators will be reviewed and updated through discharge.    During the Covid 19 public health emergency, the inpatient rehabilitation unit of the [facility] will use acute care beds on another unit to provide each patient with a private room. This effort is to assure proper infection control and to optimize care management during this public health emergency.

## 2018-10-07 MED ORDER — FLUCONAZOLE 150 MG PO TABS
150.0000 mg | ORAL_TABLET | Freq: Once | ORAL | Status: AC
  Administered 2018-10-07: 08:00:00 150 mg via ORAL
  Filled 2018-10-07: qty 1

## 2018-10-08 ENCOUNTER — Inpatient Hospital Stay (HOSPITAL_COMMUNITY): Payer: Medicare Other | Admitting: Physical Therapy

## 2018-10-08 ENCOUNTER — Inpatient Hospital Stay (HOSPITAL_COMMUNITY): Payer: Medicare Other | Admitting: Occupational Therapy

## 2018-10-08 DIAGNOSIS — N182 Chronic kidney disease, stage 2 (mild): Secondary | ICD-10-CM

## 2018-10-08 DIAGNOSIS — N179 Acute kidney failure, unspecified: Secondary | ICD-10-CM | POA: Insufficient documentation

## 2018-10-08 DIAGNOSIS — I10 Essential (primary) hypertension: Secondary | ICD-10-CM

## 2018-10-08 DIAGNOSIS — N189 Chronic kidney disease, unspecified: Secondary | ICD-10-CM | POA: Insufficient documentation

## 2018-10-08 DIAGNOSIS — E8809 Other disorders of plasma-protein metabolism, not elsewhere classified: Secondary | ICD-10-CM

## 2018-10-08 DIAGNOSIS — D62 Acute posthemorrhagic anemia: Secondary | ICD-10-CM

## 2018-10-08 LAB — CBC WITH DIFFERENTIAL/PLATELET
Abs Immature Granulocytes: 0.04 10*3/uL (ref 0.00–0.07)
Basophils Absolute: 0 10*3/uL (ref 0.0–0.1)
Basophils Relative: 1 %
Eosinophils Absolute: 0.2 10*3/uL (ref 0.0–0.5)
Eosinophils Relative: 3 %
HCT: 18.9 % — ABNORMAL LOW (ref 36.0–46.0)
Hemoglobin: 6 g/dL — CL (ref 12.0–15.0)
Immature Granulocytes: 1 %
Lymphocytes Relative: 24 %
Lymphs Abs: 1.6 10*3/uL (ref 0.7–4.0)
MCH: 24.1 pg — ABNORMAL LOW (ref 26.0–34.0)
MCHC: 31.7 g/dL (ref 30.0–36.0)
MCV: 75.9 fL — ABNORMAL LOW (ref 80.0–100.0)
Monocytes Absolute: 0.7 10*3/uL (ref 0.1–1.0)
Monocytes Relative: 10 %
Neutro Abs: 4 10*3/uL (ref 1.7–7.7)
Neutrophils Relative %: 61 %
Platelets: 355 10*3/uL (ref 150–400)
RBC: 2.49 MIL/uL — ABNORMAL LOW (ref 3.87–5.11)
RDW: 17 % — ABNORMAL HIGH (ref 11.5–15.5)
WBC: 6.5 10*3/uL (ref 4.0–10.5)
nRBC: 0 % (ref 0.0–0.2)

## 2018-10-08 LAB — COMPREHENSIVE METABOLIC PANEL
ALT: 24 U/L (ref 0–44)
AST: 68 U/L — ABNORMAL HIGH (ref 15–41)
Albumin: 2.5 g/dL — ABNORMAL LOW (ref 3.5–5.0)
Alkaline Phosphatase: 132 U/L — ABNORMAL HIGH (ref 38–126)
Anion gap: 10 (ref 5–15)
BUN: 19 mg/dL (ref 6–20)
CO2: 27 mmol/L (ref 22–32)
Calcium: 8.7 mg/dL — ABNORMAL LOW (ref 8.9–10.3)
Chloride: 102 mmol/L (ref 98–111)
Creatinine, Ser: 1.18 mg/dL — ABNORMAL HIGH (ref 0.44–1.00)
GFR calc Af Amer: >60 mL/min (ref >60)
GFR calc non Af Amer: 55 mL/min — ABNORMAL LOW (ref >60)
Glucose, Bld: 96 mg/dL (ref 70–99)
Potassium: 3.7 mmol/L (ref 3.5–5.1)
Sodium: 139 mmol/L (ref 135–145)
Total Bilirubin: 0.4 mg/dL (ref 0.3–1.2)
Total Protein: 5.9 g/dL — ABNORMAL LOW (ref 6.5–8.1)

## 2018-10-08 LAB — CBC
HCT: 21 % — ABNORMAL LOW (ref 36.0–46.0)
Hemoglobin: 6.7 g/dL — CL (ref 12.0–15.0)
MCH: 24.2 pg — ABNORMAL LOW (ref 26.0–34.0)
MCHC: 31.9 g/dL (ref 30.0–36.0)
MCV: 75.8 fL — ABNORMAL LOW (ref 80.0–100.0)
Platelets: 406 10*3/uL — ABNORMAL HIGH (ref 150–400)
RBC: 2.77 MIL/uL — ABNORMAL LOW (ref 3.87–5.11)
RDW: 16.4 % — ABNORMAL HIGH (ref 11.5–15.5)
WBC: 6.8 10*3/uL (ref 4.0–10.5)
nRBC: 0.3 % — ABNORMAL HIGH (ref 0.0–0.2)

## 2018-10-08 LAB — PREPARE RBC (CROSSMATCH)

## 2018-10-08 MED ORDER — DIPHENHYDRAMINE HCL 25 MG PO CAPS
25.0000 mg | ORAL_CAPSULE | Freq: Once | ORAL | Status: AC
  Administered 2018-10-08: 25 mg via ORAL
  Filled 2018-10-08: qty 1

## 2018-10-08 MED ORDER — ACETAMINOPHEN 325 MG PO TABS
650.0000 mg | ORAL_TABLET | Freq: Once | ORAL | Status: AC
  Administered 2018-10-08: 12:00:00 650 mg via ORAL
  Filled 2018-10-08: qty 2

## 2018-10-08 MED ORDER — SODIUM CHLORIDE 0.9% FLUSH: 10.0000 mL | INTRAVENOUS | Status: DC | PRN

## 2018-10-08 MED ORDER — PRO-STAT SUGAR FREE PO LIQD
30.0000 mL | Freq: Two times a day (BID) | ORAL | Status: DC
  Administered 2018-10-08 – 2018-10-13 (×4): 30 mL via ORAL
  Filled 2018-10-08 (×11): qty 30

## 2018-10-08 MED ORDER — FUROSEMIDE 10 MG/ML IJ SOLN
20.0000 mg | Freq: Once | INTRAMUSCULAR | Status: AC
  Administered 2018-10-08: 20 mg via INTRAVENOUS
  Filled 2018-10-08: qty 2

## 2018-10-08 MED ORDER — SENNOSIDES-DOCUSATE SODIUM 8.6-50 MG PO TABS
2.0000 | ORAL_TABLET | Freq: Two times a day (BID) | ORAL | Status: DC
  Administered 2018-10-09 – 2018-10-14 (×10): 2 via ORAL
  Filled 2018-10-08 (×11): qty 2

## 2018-10-08 MED ORDER — SODIUM CHLORIDE 0.9% IV SOLUTION
Freq: Once | INTRAVENOUS | Status: AC
  Administered 2018-10-08: 16:00:00 via INTRAVENOUS

## 2018-10-08 NOTE — Progress Notes (Signed)
[facility] PHYSICAL MEDICINE & REHABILITATION PROGRESS NOTE   Subjective/Complaints: Patient seen sitting up in this AM.  She states she did not sleep well overnight nor had a good weekend due to issues with her VAC.  She notes constipation as well.  Discussed with nursing and PA critical Hb value.   Review of systems: Denies CP, SOB, N/V/D.   Objective:   No results found. Recent Labs    10/08/18 0543 10/08/18 0931  WBC 6.5 6.8  HGB 6.0* 6.7*  HCT 18.9* 21.0*  PLT 355 406*   Recent Labs    10/08/18 0543  NA 139  K 3.7  CL 102  CO2 27  GLUCOSE 96  BUN 19  CREATININE 1.18*  CALCIUM 8.7*    Intake/Output Summary (Last 24 hours) at 10/08/2018 1152 Last data filed at 10/07/2018 1835 Gross per 24 hour  Intake 758 ml  Output -  Net 758 ml     Physical Exam: Vital Signs Blood pressure 113/83, pulse 94, temperature 98.3 F (36.8 C), temperature source Oral, resp. rate 19, height 5\' 8"  (1.727 m), weight (!) 143.2 kg, SpO2 100 %. Constitutional: No distress . Vital signs reviewed. Obese HENT: Normocephalic.  Atraumatic. Eyes: EOMI. No discharge. Cardiovascular: No JVD. Respiratory: Normal effort. GI: Non-distended. Musc: RLE with edema and tenderness Neurological: She is alertand oriented Motor: B/l UE 5/5.  RLE limited by pain, grossly 3/5 prox to distal.  LLE: 4/5 proximal to distal  Skin: RLE with VAC. Psychiatric: She has anormal mood and affect. Herbehavior is normal.Judgmentand thought contentnormal.   Assessment/Plan: 1. Functional deficits secondary to debility peripheral vascular disease which require 3+ hours per day of interdisciplinary therapy in a comprehensive inpatient rehab setting.  Physiatrist is providing close team supervision and 24 hour management of active medical problems listed below.  Physiatrist and rehab team continue to assess barriers to discharge/monitor patient progress toward functional and medical goals  Care  Tool:  Bathing    Body parts bathed by patient: Right arm, Left arm, Abdomen, Buttocks, Face         Bathing assist Assist Level: Supervision/Verbal cueing     Upper Body Dressing/Undressing Upper body dressing   What is the patient wearing?: Hospital gown only    Upper body assist Assist Level: Minimal Assistance - Patient > 75%    Lower Body Dressing/Undressing Lower body dressing      What is the patient wearing?: Pants     Lower body assist Assist for lower body dressing: Contact Guard/Touching assist     Toileting Toileting    Toileting assist Assist for toileting: Independent with assistive device Assistive Device Comment: Front wheel walker   Transfers Chair/bed transfer  Transfers assist     Chair/bed transfer assist level: Contact Guard/Touching assist     Locomotion Ambulation   Ambulation assist      Assist level: Minimal Assistance - Patient > 75% Assistive device: Walker-rolling Max distance: 35   Walk 10 feet activity   Assist     Assist level: Minimal Assistance - Patient > 75% Assistive device: Walker-rolling   Walk 50 feet activity   Assist Walk 50 feet with 2 turns activity did not occur: Safety/medical concerns         Walk 150 feet activity   Assist Walk 150 feet activity did not occur: Safety/medical concerns         Walk 10 feet on uneven surface  activity   Assist Walk 10 feet on uneven surfaces  activity did not occur: Safety/medical concerns         Wheelchair     Assist   Type of Wheelchair: Manual    Wheelchair assist level: Supervision/Verbal cueing Max wheelchair distance: 157ft     Wheelchair 50 feet with 2 turns activity    Assist        Assist Level: Supervision/Verbal cueing   Wheelchair 150 feet activity     Assist     Assist Level: Supervision/Verbal cueing    Medical Problem List and Plan: 1.Functional deficitssecondary to debility related to right leg  wound with infection and associated complications  Cont CIR  Weekend notes reviewed - constipation and VAC issues 2.H/o DVT/Antithrombotics: -DVT/anticoagulation:Pharmaceutical:Lovenox and coumadin on hold due to bleeding. -antiplatelet therapy: N/A 3. Pain Management:Oxycodone prn. 4. Mood:LCSW to follow for evaluation and support. -antipsychotic agents: N/A 5. Neuropsych: This patientiscapable of making decisions on herown behalf. 6. Skin/Wound Care:Wound VAC changed to [place] and to remain in place till 10/12/18.  7. Fluids/Electrolytes/Nutrition:Monitor I/Osand encourage appropriate PO intake. 8. HTN: Monitor BP bid. Continue HCTZ and Norvasc.  Vitals:   10/08/18 0833 10/08/18 1149  BP: 103/63 113/83  Pulse: 97 94  Resp:  19  Temp:  98.3 F (36.8 C)  SpO2: 100% 100%   Controlled on 5/25 9. Morbid obesity: BMI 53. Educate on appropriate diet and weight loss to help promote mobility.  10. OSA: Noncompliant with CPAP 11. H/O depression: Continue Wellbutrin. 12. ABLA  Hb 6.7 on repeat labs on 5/25  Will transfusion  Repeat labs for tomorrow 13. CKD, stage II  Cr 1.18 on 5/25  Cont to monitor 14. Hypoalbuminemia  Supplement initiated on 5/25  LOS: 3 days A FACE TO FACE EVALUATION WAS PERFORMED  Nashay Brickley [name] 10/08/2018, 11:52 AM

## 2018-10-08 NOTE — Significant Event (Addendum)
CRITICAL VALUE ALERT  Critical Value:  Hemoglobin 6.0  Date & Time Notied: 10/08/2018 0805  Provider Notified: Delice Lesch, MD  Orders Received/Actions taken: Awaiting orders.    10/08/2018, 2119  Notified Regina, PA-C, received orders for STAT CBC. 2 units PRBC ordered. IV placement ordered, Type and Screen ordered, Consent ordered, and pre-medications ordered. Patient agreeable to transfusion.

## 2018-10-08 NOTE — Progress Notes (Signed)
Occupational Therapy Note  Patient Details  Name: Alice [name] MRN: [id number] Date of Birth: [birth year]  Today's Date: 10/08/2018 OT Missed Time: 37 Minutes Missed Time Reason: Nursing care(IV infiltrated)  Pt missed 45 mins scheduled OT treatment session due to RN present providing care due to infiltrated midline.  Pt in pain and and blood transfusion paused at this time.  Will continue to follow as pt able.   Simonne Come 10/08/2018, 2:58 PM

## 2018-10-08 NOTE — Progress Notes (Signed)
Physical Therapy Session Note  Patient Details  Name: JAYLAN DUGGAR MRN: [id number] Date of Birth: 1971-01-22  Today's Date: 10/08/2018 PT Individual Time: 0830-0840 PT Individual Time Calculation (min): 10 min   Short Term Goals: Week 1:  PT Short Term Goal 1 (Week 1): STG=LTG due to ELOS  Skilled Therapeutic Interventions/Progress Updates:    pt seen bedside to perform bed level exercise per PA order due to critical lab value. Pt asks to use toilet. Pt performs transfers sit <> stand and to toilet with RW with supervision.  Pt asks to be left on toilet as "I will be here for a long while".  Pt left on toilet with instructions to pull cord.  Therapy Documentation Precautions:  Precautions Precautions: Fall Precaution Comments: RLE wound vac Restrictions Weight Bearing Restrictions: Yes RLE Weight Bearing: Weight bearing as tolerated General: PT Amount of Missed Time (min): 50 Minutes PT Missed Treatment Reason: Patient ill (Comment)(hemoglobin 6.0) Pain: Pt c/o LE pain, meds given prior to session   Therapy/Group: Individual Therapy  Chaniece Barbato 10/08/2018, 12:17 PM

## 2018-10-08 NOTE — Progress Notes (Signed)
Social Work  Social Work Assessment and Plan  Patient Details  Name: Alice [name] MRN: [id number] Date of Birth: [birth year]  Today's Date: 10/08/2018  Problem List:  Patient Active Problem List   Diagnosis Date Noted  . Debility 10/05/2018  . Chronic ulcer of right leg, with fat layer exposed ([place]) 09/28/2018  . Chronic ulcer of leg, right, with necrosis of muscle ([place])   . Iron deficiency anemia 01/29/2018  . Pulmonary embolus ([place]) 01/09/2018  . Encounter for therapeutic drug monitoring 01/09/2018  . Severe malnutrition ([place]) 08/24/2017  . Acute kidney injury superimposed on CKD ([place]) 12/28/2016  . Hx of skin graft 08/01/2016  . 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 ([place]) 07/25/2016  . 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 ([place]) 07/05/2016  . Trichomonal infection 11/24/2015  . Abdominal pain 05/02/2015  . Symptomatic cholelithiasis 04/16/2015  . Acute bilateral upper abdominal pain 04/16/2015  . Microcytic anemia 05/18/2013  . Hypotension, unspecified 05/17/2013  . CKD (chronic kidney disease), stage III ([place]) 05/17/2013  . Hypokalemia 05/17/2013  . Anal fissure 02/06/2013  . Anal skin tag 02/06/2013  . ALLERGIC RHINITIS 03/05/2010  . LOW BACK PAIN SYNDROME 12/13/2007  . Local infection of skin and subcutaneous tissue 10/10/2007  . OBESITY, MORBID 12/02/2006  . HTN (hypertension) 12/02/2006  . History of pulmonary embolism 12/02/2006  . History of DVT (deep vein thrombosis) 12/02/2006  . 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 GASTROESOPHAGEAL REFLUX DISEASE 12/02/2006  . OSA (obstructive sleep apnea) 12/02/2006   Past Medical History:  Past Medical History:  Diagnosis Date  . Anemia   . Anxiety   . Arthritis   . Chronic bronchitis ([place])   . Chronic upper back pain   . Depression   . DVT (deep venous thrombosis) ([facility])    BLE  . GERD (gastroesophageal reflux disease)   .  Headache    "weekly" (04/16/2015)  . Hypertension   . Migraine    "monthly" (04/16/2015)  . Morbid obesity with BMI of 50.0-59.9, adult ([place])   . Obesity   . Pneumonia 2014  . Pulmonary embolism ([place])   . Sinusitis nasal   . Sleep apnea    "I'm suppose to wear a mask but I don't" (04/16/2015)  . Varicose veins of right lower extremity   . Venous stasis of lower extremity    right  . Wears dentures   . Wears glasses    Past Surgical History:  Past Surgical History:  Procedure Laterality Date  . CHOLECYSTECTOMY N/A 04/18/2015   Procedure: LAPAROSCOPIC CHOLECYSTECTOMY;  Surgeon: Ralene Ok, MD;  Location: [facility];  Service: General;  Laterality: N/A;  . HYSTEROSCOPY W/D&C  12/24/2001   Archie Endo 09/28/2010  . I&D EXTREMITY Right 07/25/2016   Procedure: IRRIGATION AND DEBRIDEMENT RIGHT LEG ULCER, APPLY VERAFLO VAC;  Surgeon: [name], MD;  Location: [facility];  Service: Orthopedics;  Laterality: Right;  . I&D EXTREMITY Right 09/28/2018   Procedure: DEBRIDEMENT RIGHT LOWER LEG WITH PLACEMENT WITH PLACEMENT OF SKIN GRAFT AND VAC;  Surgeon: [name], MD;  Location: [facility];  Service: Orthopedics;  Laterality: Right;  . I&D EXTREMITY Right 10/03/2018   Procedure: REPEAT IRRIGATION AND DEBRIDEMENT RIGHT LOWER LEG, VAC PLACEMENT;  Surgeon: [name], MD;  Location: [facility];  Service: Orthopedics;  Laterality: Right;  . INCISE AND DRAIN ABCESS Right 07/14/2016  . INCISION AND DRAINAGE Right 09/10/2008   leg:  skin and  soft tissue and muscle/notes 09/15/2010  . INCISION AND DRAINAGE Right 01/01/2008   Chronic venous stasis insufficiency ulcer,/notes 09/14/2010/  . INCISION AND DRAINAGE Right 10/4401   calf w/application wound vac/notes 07/20/2010  . INCISION AND DRAINAGE Right 07/25/2016   IRRIGATION AND DEBRIDEMENT RIGHT LEG ULCER,  . LAPAROSCOPIC GASTRIC BYPASS  ~ 2007  . MULTIPLE TOOTH EXTRACTIONS    . SKIN GRAFT SPLIT THICKNESS LEG / FOOT Right 07/25/2016   LEG  . SKIN SPLIT GRAFT Right  07/27/2016   Procedure: SKIN GRAFT RIGHT LEG WITH THERASKIN APPLICATION;  Surgeon: [name], MD;  Location: [facility];  Service: Orthopedics;  Laterality: Right;  . TONSILLECTOMY     Social History:  reports that she has never smoked. She has never used smokeless tobacco. She reports that she does not drink alcohol or use drugs.  Family / Support Systems Marital Status: Single Patient Roles: Other (Comment)(sibling & niece) Other Supports: Antony Contras [phone]   Anticipated Caregiver: Sister can stay with if needed, pt wants to be mod/i  Ability/Limitations of Caregiver: Sister in good health Caregiver Availability: Other (Comment)(Depends upon her needs) Family Dynamics: Close knit with her sister and extended family. She was helping her uncle who has cancer. Her aunt is also supportive and will assist if needed. They all pull together as a family.  Social History Preferred language: English Religion: [employer] Cultural Background: No issues Education: HS Read: Yes Write: Yes Employment Status: Disabled Public relations account executive Issues: No issues  Guardian/Conservator: None-according to MD pt is capable of making her own decisions while here   Abuse/Neglect Abuse/Neglect Assessment Can Be Completed: Yes Physical Abuse: Denies Verbal Abuse: Denies Sexual Abuse: Denies Exploitation of patient/patient's resources: Denies Self-Neglect: Denies  Emotional Status Pt's affect, behavior and adjustment status: Pt is motivated to do well and get back home. Her main concerns is her leg heals and does well. She has noticed she is not feeling well and knows the reason why now her hemoglobin is low. She has always managed by herself and wants to again. Recent Psychosocial Issues: wound healing issues Psychiatric History: No history deferred depression screen due to adjusting well and is glad to be here on rehab. Will ask input from team regarding need to be seen while  here Substance Abuse History: No issues  Patient / Family Perceptions, Expectations & Goals Pt/Family understanding of illness & functional limitations: Pt is able to explain her wound and the issues she is having. She reports she talks with MD daily and has been told her wound graft is doing well and she wants this to continue. She is hopeful she will do well here. Premorbid pt/family roles/activities: Sister, niece, friend, church member Anticipated changes in roles/activities/participation: resume Pt/family expectations/goals: Pt states: " I want to take care of my leg and myself before I leave here."  Sister states: " I hope she does well but will help her if needed."  [employer]: None Premorbid Home Care/DME Agencies: None Transportation available at discharge: pt drive and sister drives Resource referrals recommended: Support group (specify)  Discharge Planning Living Arrangements: Alone Support Systems: Other relatives, Friends/neighbors Type of Residence: Private residence Insurance Resources: Information systems manager, [employer] (specify county) Museum/gallery curator Resources: [employer] Screen Referred: No Living Expenses: Education officer, community Management: Patient Does the patient have any problems obtaining your medications?: No Home Management: Patient Patient/Family Preliminary Plans: Return home to her home with her sister coming and staying if needed. She has extended family who will assist if  needed. Will work on discharge needs. She has no equipment or had [facility] services prior to admission.  Social Work Anticipated Follow Up Needs: HH/OP, Support Group  Clinical Impression Pleasant female who is motivated to do well and heal. Her sister and aunt can assist if needed. She is more concerned about 6N losing her glasses. Will follow up on this. Will work on discharge needs.  Elease Hashimoto 10/08/2018, 11:24 AM

## 2018-10-08 NOTE — Progress Notes (Signed)
Patient developed hematoma in right upper extremity at medline insertion site. Blood transfusion stopped and midline pulled. Hot pack placed on RUE and patient beginning to relax a little. Will continue to monitor patient throughout shift.

## 2018-10-08 NOTE — Progress Notes (Signed)
Occupational Therapy Note  Patient Details  Name: Alice [name] MRN: [id number] Date of Birth: 13-Feb-1971  Today's Date: 10/08/2018 OT Missed Time: 7 Minutes Missed Time Reason: Nursing care(blood transfusion)  Pt missed 60 mins scheduled OT treatment session due to plans to receive blood transfusion.  RN arriving to start IV and begin blood transfusion. Pt requesting to defer until later.  Will follow up this afternoon.   Simonne Come 10/08/2018, 12:38 PM

## 2018-10-08 NOTE — Progress Notes (Signed)
Patient complains of constipation to this RN. One Fleets enema given with some relief. Patient still not feeling enough relief from constipation. This RN offered assistance with manual disimpaction and patient is agreeable. Patient has large formed stool in anal vault and RN was able to remove some but patient felt she could try again on her own. Patient then decided to go back to bed. Patient could benefit from additional bowel medications scheduled.

## 2018-10-08 NOTE — IPOC Note (Signed)
Individualized overall Plan of Care [facility]) Patient Details Name: Alice [name] MRN: [id number] DOB: [birth year]  Admitting Diagnosis: [facility] Problems: Active Problems:   Debility   Hypoalbuminemia   CKD (chronic kidney disease), stage II   Acute blood loss anemia   Benign essential HTN     Functional Problem List: Nursing Bowel, Bladder, Edema, Endurance, Medication Management, Pain, Skin Integrity  PT Balance, Edema, Endurance, Nutrition, Pain, Safety, Skin Integrity  OT Balance, Endurance  SLP    TR         Basic ADL's: OT Bathing, Dressing, Toileting     Advanced  ADL's: OT Simple Meal Preparation, Light Housekeeping     Transfers: PT Bed Mobility, Bed to Chair, Car, Floor, Manufacturing systems engineer, Metallurgist: PT Ambulation, Stairs, Emergency planning/management officer     Additional Impairments: OT    SLP        TR      Anticipated Outcomes Item Anticipated Outcome  Self Feeding independent  Swallowing      Basic self-care  mod I  Toileting  mod I   Bathroom Transfers CS/mod I  Bowel/Bladder  cont x 2, reg BM, min assist  Transfers  Supervision assist with LRAD   Locomotion  Ambulatory at household level with supervision assist   Communication     Cognition     Pain  less than 4  Safety/Judgment   free from falls and adhere to safety plan   Therapy Plan: PT Intensity: Minimum of 1-2 x/day ,45 to 90 minutes PT Frequency: 5 out of 7 days PT Duration Estimated Length of Stay: 7-10 days  OT Intensity: Minimum of 1-2 x/day, 45 to 90 minutes OT Frequency: 5 out of 7 days OT Duration/Estimated Length of Stay: 5-7 days      Team Interventions: Nursing Interventions Patient/Family Education, Bowel Management, Pain Management, Medication Management, Skin Care/Wound Management  PT interventions Pain management, Ambulation/gait training, Community reintegration, Training and development officer, Cognitive remediation/compensation, Disease  management/prevention, Discharge planning, DME/adaptive equipment instruction, Neuromuscular re-education, Functional mobility training, Patient/family education, Psychosocial support, Skin care/wound management, Splinting/orthotics, Stair training, Therapeutic Activities, Therapeutic Exercise, UE/LE Strength taining/ROM, UE/LE Coordination activities, Visual/perceptual remediation/compensation, Wheelchair propulsion/positioning  OT Interventions Training and development officer, Discharge planning, Self Care/advanced ADL retraining, Therapeutic Activities, Functional mobility training, Patient/family education, Skin care/wound managment, DME/adaptive equipment instruction  SLP Interventions    TR Interventions    SW/CM Interventions Discharge Planning, Psychosocial Support, Patient/Family Education   Barriers to Discharge MD  Medical stability, Wound care, Weight and ABLA  Nursing      PT Inaccessible home environment, Decreased caregiver support, Medical stability, Home environment access/layout, Weight bearing restrictions, Medication compliance    OT      SLP      SW       Team Discharge Planning: Destination: PT-Home ,OT- Home , SLP-  Projected Follow-up: PT-Home health PT, OT-  None, SLP-  Projected Equipment Needs: PT-Wheelchair cushion (measurements), Wheelchair (measurements), Rolling walker with 5" wheels(wide RW ), OT- Tub/shower bench, SLP-  Equipment Details: PT- , OT-  Patient/family involved in discharge planning: PT- Patient,  OT-Patient, SLP-   MD ELOS: 5-8 days. Medical Rehab Prognosis:  Good Assessment: 48 year old female with history of HTN, GERD, DVT, morbid obesity, chronic RLE ulcers with drainage and conservative therapy. She was admitted on 5/15 and underwent I &D by Dr. Sharol Given and placed on IV antibiotics. She was taken back to OR on 5/20 for I &D,placement of A  cell and wound VAC placed. Wound cultures positive for proteus mirabilis, staph Caprae and Serratia  Marscens. ID consulted following for input as wound showing great improvement, antibiotics discontinued. Therapy ongoing and patient noted to be debiliataed, has pain limiting WB on RLE as well as anxiety affecting mobility and ADLs. Will set goals for Mod I/Supervision with PT/OT.   Due to the current state of emergency, patients may not be receiving their 3-hours of Medicare-mandated therapy.  See Team Conference Notes for weekly updates to the plan of care

## 2018-10-08 NOTE — Care Management Note (Signed)
Inpatient [facility] Individual Statement of Services  Patient Name:  Alice [name]  Date:  10/08/2018  Welcome to the [facility].  Our goal is to provide you with an individualized program based on your diagnosis and situation, designed to meet your specific needs.  With this comprehensive rehabilitation program, you will be expected to participate in at least 3 hours of rehabilitation therapies Monday-Friday, with modified therapy programming on the weekends.  Your rehabilitation program will include the following services:  Physical Therapy (PT), Occupational Therapy (OT), 24 hour per day rehabilitation nursing, Neuropsychology, Case Management (Social Worker), Rehabilitation Medicine, Nutrition Services and Pharmacy Services  Weekly team conferences will be held on Wednesday to discuss your progress.  Your Social Worker will talk with you frequently to get your input and to update you on team discussions.  Team conferences with you and your family in attendance may also be held.  Expected length of stay: 7-8 days  Overall anticipated outcome: independent with device  Depending on your progress and recovery, your program may change. Your Social Worker will coordinate services and will keep you informed of any changes. Your Social Worker's name and contact numbers are listed  below.  The following services may also be recommended but are not provided by the [facility] will be made to provide these services after discharge if needed.  Arrangements include referral to agencies that provide these services.  Your insurance has been verified to be:  Medicare & Medicaid Your primary doctor is:  Osei[facility]Bonsu  Pertinent information will be shared with your doctor and your insurance company.  Social Worker:  Ovidio Kin, [place] or ([phone]  Information discussed with and copy given to patient by: Elease Hashimoto, 10/08/2018, 8:58 AM

## 2018-10-09 ENCOUNTER — Inpatient Hospital Stay (HOSPITAL_COMMUNITY): Payer: Medicare Other | Admitting: Occupational Therapy

## 2018-10-09 ENCOUNTER — Telehealth: Payer: Self-pay

## 2018-10-09 ENCOUNTER — Inpatient Hospital Stay (HOSPITAL_COMMUNITY): Payer: Medicare Other | Admitting: Physical Therapy

## 2018-10-09 DIAGNOSIS — G8918 Other acute postprocedural pain: Secondary | ICD-10-CM

## 2018-10-09 LAB — CBC WITH DIFFERENTIAL/PLATELET
Abs Immature Granulocytes: 0.04 10*3/uL (ref 0.00–0.07)
Basophils Absolute: 0.1 10*3/uL (ref 0.0–0.1)
Basophils Relative: 1 %
Eosinophils Absolute: 0.2 10*3/uL (ref 0.0–0.5)
Eosinophils Relative: 2 %
HCT: 20 % — ABNORMAL LOW (ref 36.0–46.0)
Hemoglobin: 6.5 g/dL — CL (ref 12.0–15.0)
Immature Granulocytes: 0 %
Lymphocytes Relative: 21 %
Lymphs Abs: 2 10*3/uL (ref 0.7–4.0)
MCH: 24.4 pg — ABNORMAL LOW (ref 26.0–34.0)
MCHC: 32.5 g/dL (ref 30.0–36.0)
MCV: 75.2 fL — ABNORMAL LOW (ref 80.0–100.0)
Monocytes Absolute: 1.1 10*3/uL — ABNORMAL HIGH (ref 0.1–1.0)
Monocytes Relative: 12 %
Neutro Abs: 6 10*3/uL (ref 1.7–7.7)
Neutrophils Relative %: 64 %
Platelets: 420 10*3/uL — ABNORMAL HIGH (ref 150–400)
RBC: 2.66 MIL/uL — ABNORMAL LOW (ref 3.87–5.11)
RDW: 16.6 % — ABNORMAL HIGH (ref 11.5–15.5)
WBC: 9.5 10*3/uL (ref 4.0–10.5)
nRBC: 0.2 % (ref 0.0–0.2)

## 2018-10-09 LAB — PROTIME-INR
INR: 1.1 (ref 0.8–1.2)
Prothrombin Time: 14.2 seconds (ref 11.4–15.2)

## 2018-10-09 LAB — PREPARE RBC (CROSSMATCH)

## 2018-10-09 MED ORDER — ACETAMINOPHEN 325 MG PO TABS
650.0000 mg | ORAL_TABLET | Freq: Once | ORAL | Status: AC
  Administered 2018-10-09: 650 mg via ORAL

## 2018-10-09 MED ORDER — SODIUM CHLORIDE 0.9% IV SOLUTION
Freq: Once | INTRAVENOUS | Status: AC
  Administered 2018-10-09: 14:00:00 via INTRAVENOUS

## 2018-10-09 MED ORDER — WARFARIN SODIUM 7.5 MG PO TABS
7.5000 mg | ORAL_TABLET | Freq: Once | ORAL | Status: AC
  Administered 2018-10-09: 21:00:00 7.5 mg via ORAL
  Filled 2018-10-09: qty 1

## 2018-10-09 MED ORDER — DIPHENHYDRAMINE HCL 25 MG PO CAPS: 25.0000 mg | ORAL_CAPSULE | Freq: Once | ORAL | Status: AC

## 2018-10-09 MED ORDER — WARFARIN - PHARMACIST DOSING INPATIENT
Freq: Every day | Status: DC
  Administered 2018-10-13: 18:00:00

## 2018-10-09 MED ORDER — FLUCONAZOLE 150 MG PO TABS
150.0000 mg | ORAL_TABLET | Freq: Once | ORAL | Status: AC
  Administered 2018-10-09: 18:00:00 150 mg via ORAL
  Filled 2018-10-09: qty 1

## 2018-10-09 MED ORDER — FUROSEMIDE 10 MG/ML IJ SOLN
20.0000 mg | Freq: Once | INTRAMUSCULAR | Status: AC
  Administered 2018-10-09: 18:00:00 20 mg via INTRAVENOUS
  Filled 2018-10-09: qty 2

## 2018-10-09 NOTE — Telephone Encounter (Signed)
Pt s/p I&D with [facility] calling to ask when pt can resume her coumadin.

## 2018-10-09 NOTE — Progress Notes (Signed)
ANTICOAGULATION CONSULT NOTE - Initial Consult  Pharmacy Consult for warfarin Indication: h/o DVT  Allergies  Allergen Reactions  . Vicodin [Hydrocodone-Acetaminophen] Nausea And Vomiting    Patient Measurements: Height: 5\' 8"  (172.7 cm) Weight: (!) 315 lb 11.2 oz (143.2 kg) IBW/kg (Calculated) : 63.9   Vital Signs: Temp: 98.8 F (37.1 C) (05/26 1504) Temp Source: Oral (05/26 1423) BP: 111/52 (05/26 1504) Pulse Rate: 95 (05/26 1504)  Labs: Recent Labs    10/08/18 0543 10/08/18 0931 10/09/18 1050  HGB 6.0* 6.7* 6.5*  HCT 18.9* 21.0* 20.0*  PLT 355 406* 420*  CREATININE 1.18*  --   --     Estimated Creatinine Clearance: 89.9 mL/min (A) (by C-G formula based on SCr of 1.18 mg/dL (H)).   Medical History: Past Medical History:  Diagnosis Date  . Anemia   . Anxiety   . Arthritis   . Chronic bronchitis ([place])   . Chronic upper back pain   . Depression   . DVT (deep venous thrombosis) ([facility])    BLE  . GERD (gastroesophageal reflux disease)   . Headache    "weekly" (04/16/2015)  . Hypertension   . Migraine    "monthly" (04/16/2015)  . Morbid obesity with BMI of 50.0-59.9, adult ([place])   . Obesity   . Pneumonia 2014  . Pulmonary embolism ([place])   . Sinusitis nasal   . Sleep apnea    "I'm suppose to wear a mask but I don't" (04/16/2015)  . Varicose veins of right lower extremity   . Venous stasis of lower extremity    right  . Wears dentures   . Wears glasses    Assessment: 48 yo female with h/o DVT on warfarin prior to acute admit. Warfarin placed on hold due to bleeding from wound. OK obtained from Ortho to resume warfarin.  PTA dose was 7.5mg  daily except 10 mg on Wednesdays and Sundays. INR on acute admission was 2.7. INR today 1.1. Restart at home dose (of note patient to receive fluconazole x 1 today which may affect INR to some degree).   Goal of Therapy:  INR 2-3 Monitor platelets by anticoagulation protocol: Yes   Plan:  PT/INR daily Restart  warfarin 7.5mg  PO x 1 tonight Monitor for further bleeding  Tiona Ruane A. Levada Dy, PharmD, [place] Please utilize Amion for appropriate phone number to reach the unit pharmacist ([place])   10/09/2018,3:59 PM

## 2018-10-09 NOTE — Progress Notes (Signed)
Physical Therapy Session Note  Patient Details  Name: Alice [name] MRN: [id number] Date of Birth: 02-24-1971  Today's Date: 10/09/2018 PT Individual Time: 9211-9417 PT Individual Time Calculation (min): 68 min   Short Term Goals: Week 1:  PT Short Term Goal 1 (Week 1): STG=LTG due to ELOS  Skilled Therapeutic Interventions/Progress Updates:      Therapy Documentation Precautions:  Precautions Precautions: Fall Precaution Comments: RLE wound vac Restrictions Weight Bearing Restrictions: Yes RLE Weight Bearing: Weight bearing as tolerated General: Vital Signs: Therapy Vitals Temp: 98 F (36.7 C) Temp Source: Oral Pulse Rate: 94 Resp: 19 BP: (!) 103/49(RN notified ) Patient Position (if appropriate): Lying Oxygen Therapy SpO2: 97 % O2 Device: Room Air  Treatment: Pt in bed on arrival agreed to PT. Pain 3/10 in right LE, just received pain pill from RN. No new labs in chart as yet for new Hgb level, still pending.  Supervision for supine to sitting edge of bed with HOB 45 degrees and rail used. Min guard assist for standing to wide RW and then for gait to bathroom. Supervision to sit to toilet and min guard to stand back up with cues to use rail, not pull on walker with both hands. Pt independent with pericare. Pants donned while seated on toilet and pt able to pull them up in standing with single UE support on walker for balance, min guard assist. Supervision to stand at sink to wash hands and min guard assist RW for gait back to edge of bed. Seated edge of bed with supervision pt changed from her gown into bra/shirt with no assistance needed. Pt then ambulated with RW from room to NuStep with one seated rest break about half way due to right LE fatigue/pain. Min guard assist for balance with gait and for transfer to/from wheelchair. Supervision to sit to seat of NuStep. On NuStep with UE/LE's for 10 minutes at level 4 with >/=30 steps per minute for strengthening and activity  tolerance. Supervision with cues on hand placement to stand to RW. Gait from NuStep back to room with min guard assist, no rest breaks needed. Pt seated in recliner in room with cues to reach back with sitting down. Seated with feet on floor- bil LE's heel/toe raises for 20 reps. Then with right LE only- long arc quads for 20 reps. With feet elevated on foot rest- left LE quad sets with 5 sec holds x 20 reps, then straight leg raises for 20 reps with assist for controlled lowering with each rep.   Pt left seated in recliner with feet up and all needs in reach. Pt tolerated session well. Did report mild dizziness with first stand going to bathroom that resolved with no further complaints made.    Therapy/Group: Individual Therapy  Lindon Romp, PTA, CLT 10/09/18, 10:57 AM

## 2018-10-09 NOTE — Progress Notes (Signed)
Physical Therapy Session Note  Patient Details  Name: Alice [name] MRN: [id number] Date of Birth: 1971-02-21  Today's Date: 10/09/2018 PT Missed Time: 73 Minutes Missed Time Reason: Other (Comment)(Hbg 6.5. hold for now per PA)  Short Term Goals: Week 1:  PT Short Term Goal 1 (Week 1): STG=LTG due to ELOS  Skilled Therapeutic Interventions/Progress Updates:      Therapy Documentation Precautions:  Precautions Precautions: Fall Precaution Comments: RLE wound vac Restrictions Weight Bearing Restrictions: Yes RLE Weight Bearing: Weight bearing as tolerated General: PT Amount of Missed Time (min): 60 Minutes PT Missed Treatment Reason: Other (Comment)(Hbg 6.5. hold for now per PA)  Treatment: Pt with recheck of Hgb post one unit of blood yesterday. Hgb now 6.5 from 6.0 yesterday. Pt to receive another unit of PRBC's today. Spoke with PA via phone. Let pt receive blood and hold at this time due to pt mildly symptomatic this am with 1st therapy session.    Therapy/Group: Individual Therapy  Lindon Romp, PTA, CLT 10/09/18, 2:50 PM

## 2018-10-09 NOTE — Telephone Encounter (Signed)
Ok resume coumadin

## 2018-10-09 NOTE — Telephone Encounter (Signed)
Reesa Chew calling asking when patient is to resume her coumadin?

## 2018-10-09 NOTE — Progress Notes (Signed)
Occupational Therapy Session Note  Patient Details  Name: Alice [name] MRN: [id number] Date of Birth: [birth year]  Today's Date: 10/09/2018 OT Individual Time: 1100-1200 OT Individual Time Calculation (min): 60 min    Short Term Goals: Week 1:  OT Short Term Goal 1 (Week 1): na STG = LTG  Skilled Therapeutic Interventions/Progress Updates:    Treatment session with focus on ADL retraining and endurance.  Pt received upright in recliner reporting pain in RLE but not yet due for pain meds.  Pt ambulated around room with RW with CGA to gather clothing prior to bathing at sink.  Pt ambulated to toilet with CGA and completed toileting task at Mod I level with RW.  Bathing completed at sit > stand level with increased time (pt reports typically takes a long time getting ready).  Pt reports mild dizziness when bending forward to don socks but reports that it passed after short period of rest.  Pt completed all bathing and dressing at sti > stand level at overall supervision.  Discussed home shower set up and routine with plan to attempt shower transfer during future therapy session.  Pt reports fatigue and returned to recliner with CGA with RW.  Pt left upright in recliner with legs elevated and all needs in reach.  Therapy Documentation Precautions:  Precautions Precautions: Fall Precaution Comments: RLE wound vac Restrictions Weight Bearing Restrictions: Yes RLE Weight Bearing: Weight bearing as tolerated Pain:  Pt with c/o pain 8/10.  RN notified   Therapy/Group: Individual Therapy  Simonne Come 10/09/2018, 12:49 PM

## 2018-10-09 NOTE — Progress Notes (Signed)
[facility] PHYSICAL MEDICINE & REHABILITATION PROGRESS NOTE   Subjective/Complaints: Patient seen laying in bed this morning.  She states she slept well overnight.  She states she feels much better today.  She has questions regarding her hemoglobin.  She states she wants wound VAC taken off.  Review of systems: Denies CP, SOB, N/V/D.   Objective:   No results found. Recent Labs    10/08/18 0543 10/08/18 0931  WBC 6.5 6.8  HGB 6.0* 6.7*  HCT 18.9* 21.0*  PLT 355 406*   Recent Labs    10/08/18 0543  NA 139  K 3.7  CL 102  CO2 27  GLUCOSE 96  BUN 19  CREATININE 1.18*  CALCIUM 8.7*    Intake/Output Summary (Last 24 hours) at 10/09/2018 0926 Last data filed at 10/09/2018 0854 Gross per 24 hour  Intake 1816 ml  Output 120 ml  Net 1696 ml     Physical Exam: Vital Signs Blood pressure (!) 103/49, pulse 94, temperature 98 F (36.7 C), temperature source Oral, resp. rate 19, height 5\' 8"  (1.727 m), weight (!) 143.2 kg, SpO2 97 %. Constitutional: No distress . Vital signs reviewed. Obese HENT: Normocephalic.  Atraumatic. Eyes: EOMI.  No discharge. Cardiovascular: No JVD. Respiratory: Normal effort. GI: Non-distended. Musc: RLE with edema and tenderness Neurological: She is alertand oriented Motor: B/l UE 5/5.  RLE: Limited by pain, grossly 3-3+/5 prox to distal.  LLE: 4+/5 proximal to distal  Skin: RLE with VAC. Psychiatric: She has anormal mood and affect. Herbehavior is normal.Judgmentand thought contentnormal.   Assessment/Plan: 1. Functional deficits secondary to debility peripheral vascular disease which require 3+ hours per day of interdisciplinary therapy in a comprehensive inpatient rehab setting.  Physiatrist is providing close team supervision and 24 hour management of active medical problems listed below.  Physiatrist and rehab team continue to assess barriers to discharge/monitor patient progress toward functional and medical goals  Care  Tool:  Bathing    Body parts bathed by patient: Right arm, Left arm, Abdomen, Buttocks, Face         Bathing assist Assist Level: Supervision/Verbal cueing     Upper Body Dressing/Undressing Upper body dressing   What is the patient wearing?: Hospital gown only    Upper body assist Assist Level: Minimal Assistance - Patient > 75%    Lower Body Dressing/Undressing Lower body dressing      What is the patient wearing?: Pants     Lower body assist Assist for lower body dressing: Contact Guard/Touching assist     Toileting Toileting    Toileting assist Assist for toileting: Independent with assistive device Assistive Device Comment: Front wheel walker   Transfers Chair/bed transfer  Transfers assist     Chair/bed transfer assist level: Contact Guard/Touching assist     Locomotion Ambulation   Ambulation assist      Assist level: Minimal Assistance - Patient > 75% Assistive device: Walker-rolling Max distance: 35   Walk 10 feet activity   Assist     Assist level: Minimal Assistance - Patient > 75% Assistive device: Walker-rolling   Walk 50 feet activity   Assist Walk 50 feet with 2 turns activity did not occur: Safety/medical concerns         Walk 150 feet activity   Assist Walk 150 feet activity did not occur: Safety/medical concerns         Walk 10 feet on uneven surface  activity   Assist Walk 10 feet on uneven surfaces activity did  not occur: Safety/medical concerns         Wheelchair     Assist   Type of Wheelchair: Manual    Wheelchair assist level: Supervision/Verbal cueing Max wheelchair distance: 11ft     Wheelchair 50 feet with 2 turns activity    Assist        Assist Level: Supervision/Verbal cueing   Wheelchair 150 feet activity     Assist     Assist Level: Supervision/Verbal cueing    Medical Problem List and Plan: 1.Functional deficitssecondary to debility related to right leg  wound with infection and associated complications  Cont CIR 2.H/o DVT/Antithrombotics: -DVT/anticoagulation:Pharmaceutical:Lovenox and coumadin on hold due to bleeding, will plan to resume after results of CBC. -antiplatelet therapy: N/A 3. Pain Management:Oxycodone prn.  Relatively controlled on 5/26 4. Mood:LCSW to follow for evaluation and support. -antipsychotic agents: N/A 5. Neuropsych: This patientiscapable of making decisions on herown behalf. 6. Skin/Wound Care:Wound VAC changed to [place] and to remain in place until 10/12/18, discussed with patient.  7. Fluids/Electrolytes/Nutrition:Monitor I/Osand encourage appropriate PO intake. 8. HTN: Monitor BP bid. Continue HCTZ and Norvasc.  Vitals:   10/08/18 1935 10/09/18 0445  BP: 133/86 (!) 103/49  Pulse: (!) 102 94  Resp: 20 19  Temp: 98.4 F (36.9 C) 98 F (36.7 C)  SpO2: 100% 97%   Relatively controlled on 5/26 9. Morbid obesity: BMI 53. Educate on appropriate diet and weight loss to help promote mobility.  10. OSA: Noncompliant with CPAP 11. H/O depression: Continue Wellbutrin. 12. 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 Hypoalbuminemia  Supplement initiated on 5/25  LOS: 4 days A FACE TO FACE EVALUATION WAS PERFORMED  Khadijah Mastrianni [name] 10/09/2018, 9:26 AM

## 2018-10-10 ENCOUNTER — Inpatient Hospital Stay (HOSPITAL_COMMUNITY): Payer: Medicare Other | Admitting: Physical Therapy

## 2018-10-10 ENCOUNTER — Inpatient Hospital Stay (HOSPITAL_COMMUNITY): Payer: Medicare Other | Admitting: Occupational Therapy

## 2018-10-10 ENCOUNTER — Inpatient Hospital Stay (HOSPITAL_COMMUNITY): Payer: Medicare Other

## 2018-10-10 DIAGNOSIS — R791 Abnormal coagulation profile: Secondary | ICD-10-CM

## 2018-10-10 LAB — TYPE AND SCREEN
ABO/RH(D): AB POS
Antibody Screen: NEGATIVE
Unit division: 0
Unit division: 0

## 2018-10-10 LAB — CBC WITH DIFFERENTIAL/PLATELET
Abs Immature Granulocytes: 0.04 10*3/uL (ref 0.00–0.07)
Basophils Absolute: 0.1 10*3/uL (ref 0.0–0.1)
Basophils Relative: 1 %
Eosinophils Absolute: 0.3 10*3/uL (ref 0.0–0.5)
Eosinophils Relative: 3 %
HCT: 22.6 % — ABNORMAL LOW (ref 36.0–46.0)
Hemoglobin: 7.4 g/dL — ABNORMAL LOW (ref 12.0–15.0)
Immature Granulocytes: 0 %
Lymphocytes Relative: 25 %
Lymphs Abs: 2.4 10*3/uL (ref 0.7–4.0)
MCH: 25.1 pg — ABNORMAL LOW (ref 26.0–34.0)
MCHC: 32.7 g/dL (ref 30.0–36.0)
MCV: 76.6 fL — ABNORMAL LOW (ref 80.0–100.0)
Monocytes Absolute: 1.2 10*3/uL — ABNORMAL HIGH (ref 0.1–1.0)
Monocytes Relative: 13 %
Neutro Abs: 5.4 10*3/uL (ref 1.7–7.7)
Neutrophils Relative %: 58 %
Platelets: 367 10*3/uL (ref 150–400)
RBC: 2.95 MIL/uL — ABNORMAL LOW (ref 3.87–5.11)
RDW: 17.2 % — ABNORMAL HIGH (ref 11.5–15.5)
WBC: 9.3 10*3/uL (ref 4.0–10.5)
nRBC: 0.2 % (ref 0.0–0.2)

## 2018-10-10 LAB — PROTIME-INR
INR: 1.1 (ref 0.8–1.2)
Prothrombin Time: 14.4 seconds (ref 11.4–15.2)

## 2018-10-10 LAB — BPAM RBC
Blood Product Expiration Date: 202006012359
Blood Product Expiration Date: 202006012359
ISSUE DATE / TIME: 202005251153
ISSUE DATE / TIME: 202005261419
Unit Type and Rh: 600
Unit Type and Rh: 600

## 2018-10-10 MED ORDER — WARFARIN SODIUM 7.5 MG PO TABS
7.5000 mg | ORAL_TABLET | Freq: Once | ORAL | Status: AC
  Administered 2018-10-10: 7.5 mg via ORAL
  Filled 2018-10-10: qty 1

## 2018-10-10 NOTE — Progress Notes (Signed)
[facility] PHYSICAL MEDICINE & REHABILITATION PROGRESS NOTE   Subjective/Complaints: Patient seen sitting up in bed this morning.  She states she slept well overnight.  She states she had a good day of therapies yesterday.  She also notes that she lost her glasses and has been having trouble seeing.  She has questions regarding her latest hemoglobin.  Review of systems: Denies CP, SOB, N/V/D.   Objective:   No results found. Recent Labs    10/09/18 1050 10/10/18 0553  WBC 9.5 9.3  HGB 6.5* 7.4*  HCT 20.0* 22.6*  PLT 420* 367   Recent Labs    10/08/18 0543  NA 139  K 3.7  CL 102  CO2 27  GLUCOSE 96  BUN 19  CREATININE 1.18*  CALCIUM 8.7*    Intake/Output Summary (Last 24 hours) at 10/10/2018 0923 Last data filed at 10/10/2018 0811 Gross per 24 hour  Intake 590.5 ml  Output -  Net 590.5 ml     Physical Exam: Vital Signs Blood pressure 107/72, pulse 98, temperature 98.1 F (36.7 C), temperature source Oral, resp. rate 15, height 5\' 8"  (1.727 m), weight (!) 143.2 kg, SpO2 100 %. Constitutional: No distress . Vital signs reviewed. Obese HENT: Normocephalic.  Atraumatic. Eyes: EOMI.  No discharge. Cardiovascular: No JVD. Respiratory: Normal effort. GI: Non-distended. Musc: RLE with edema and tenderness Neurological: She is alertand oriented Motor: B/l UE 5/5.  RLE: Hip flexion, knee extension 4-/5, ankle dorsiflexion 3+/5, some pain inhibition.  LLE: 4+/5 proximal to distal  Skin: RLE with VAC. Psychiatric: She has anormal mood and affect. Herbehavior is normal.Judgmentand thought contentnormal.   Assessment/Plan: 1. Functional deficits secondary to debility peripheral vascular disease which require 3+ hours per day of interdisciplinary therapy in a comprehensive inpatient rehab setting.  Physiatrist is providing close team supervision and 24 hour management of active medical problems listed below.  Physiatrist and rehab team continue to assess barriers  to discharge/monitor patient progress toward functional and medical goals  Care Tool:  Bathing    Body parts bathed by patient: Right arm, Left arm, Chest, Abdomen, Front perineal area, Buttocks, Right upper leg, Left upper leg, Right lower leg, Left lower leg, Face         Bathing assist Assist Level: Set up assist     Upper Body Dressing/Undressing Upper body dressing   What is the patient wearing?: Bra, Pull over shirt    Upper body assist Assist Level: Set up assist    Lower Body Dressing/Undressing Lower body dressing      What is the patient wearing?: Pants     Lower body assist Assist for lower body dressing: Set up assist     Toileting Toileting    Toileting assist Assist for toileting: Independent with assistive device Assistive Device Comment: Front wheel walker   Transfers Chair/bed transfer  Transfers assist     Chair/bed transfer assist level: Contact Guard/Touching assist     Locomotion Ambulation   Ambulation assist      Assist level: Contact Guard/Touching assist Assistive device: Walker-rolling Max distance: 40   Walk 10 feet activity   Assist     Assist level: Minimal Assistance - Patient > 75% Assistive device: Walker-rolling   Walk 50 feet activity   Assist Walk 50 feet with 2 turns activity did not occur: Safety/medical concerns         Walk 150 feet activity   Assist Walk 150 feet activity did not occur: Safety/medical concerns  Walk 10 feet on uneven surface  activity   Assist Walk 10 feet on uneven surfaces activity did not occur: Safety/medical concerns         Wheelchair     Assist   Type of Wheelchair: Manual    Wheelchair assist level: Supervision/Verbal cueing Max wheelchair distance: 173ft     Wheelchair 50 feet with 2 turns activity    Assist        Assist Level: Supervision/Verbal cueing   Wheelchair 150 feet activity     Assist     Assist Level:  Supervision/Verbal cueing    Medical Problem List and Plan: 1.Functional deficitssecondary to debility related to right leg wound with infection and associated complications  Cont CIR  Team conference today to discuss current and goals and coordination of care, home and environmental barriers, and discharge planning with nursing, case manager, and therapies.  2.H/o DVT/Antithrombotics: -DVT/anticoagulation:Pharmaceutical:Coumadin restarted per Ortho   INR subtherapeutic on 5/27 -antiplatelet therapy: N/A 3. Pain Management:Oxycodone prn.  Relatively controlled on 5/27 4. Mood:LCSW to follow for evaluation and support. -antipsychotic agents: N/A 5. Neuropsych: This patientiscapable of making decisions on herown behalf. 6. Skin/Wound Care:Wound VAC changed to [place] and to remain in place until 10/12/18, discussed with patient.  7. Fluids/Electrolytes/Nutrition:Monitor I/Osand encourage appropriate PO intake. 8. HTN: Monitor BP bid. Continue HCTZ and Norvasc.  Vitals:   10/09/18 2034 10/10/18 0518  BP: 130/74 107/72  Pulse: 93 98  Resp: 15 15  Temp: 98.2 F (36.8 C) 98.1 F (36.7 C)  SpO2: 100% 100%   Relatively controlled on 5/27 9. Morbid obesity: BMI 53. Educate on appropriate diet and weight loss to help promote mobility.  10. OSA: Noncompliant with CPAP 11. H/O depression: Continue Wellbutrin. 12. 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 Hypoalbuminemia  Supplement initiated on 5/25  LOS: 5 days A FACE TO FACE EVALUATION WAS PERFORMED  Ankit [name] 10/10/2018, 9:23 AM

## 2018-10-10 NOTE — Progress Notes (Signed)
Wound vac administered by Probation officer and working properly. Pt was pleased with the operation of the wound vac.

## 2018-10-10 NOTE — Plan of Care (Signed)
  Problem: Consults Goal: RH GENERAL PATIENT EDUCATION Description See Patient Education module for education specifics. Outcome: Progressing   Problem: RH BOWEL ELIMINATION Goal: RH STG MANAGE BOWEL W/MEDICATION W/ASSISTANCE Description STG Manage Bowel with Medication with mod I Assistance.  Outcome: Progressing   Problem: RH BLADDER ELIMINATION Goal: RH STG MANAGE BLADDER WITH ASSISTANCE Description STG Manage Bladder With min Assistance  Outcome: Progressing   Problem: RH SKIN INTEGRITY Goal: RH STG SKIN FREE OF INFECTION/BREAKDOWN Outcome: Progressing Goal: RH STG MAINTAIN SKIN INTEGRITY WITH ASSISTANCE Description STG Maintain Skin Integrity With  [place].  Outcome: Progressing Goal: RH STG ABLE TO PERFORM INCISION/WOUND CARE W/ASSISTANCE Description STG Able To Perform Incision/Wound Care With min Assistance.  Outcome: Progressing   Problem: RH SAFETY Goal: RH STG ADHERE TO SAFETY PRECAUTIONS W/ASSISTANCE/DEVICE Description STG Adhere to Safety Precautions With min Assistance/Device.  Outcome: Progressing   Problem: RH PAIN MANAGEMENT Goal: RH STG PAIN MANAGED AT OR BELOW PT'S PAIN GOAL Description Pain level less than 4 on 0-10 scale   Outcome: Progressing   Problem: RH KNOWLEDGE DEFICIT GENERAL Goal: RH STG INCREASE KNOWLEDGE OF SELF CARE AFTER HOSPITALIZATION Description Pt to verbalize 2 lifestyle modifiers related to controlling HTN along with medication compliance  Outcome: Progressing   Problem: Consults Goal: RH GENERAL PATIENT EDUCATION Description See Patient Education module for education specifics. Outcome: Progressing   Problem: RH BOWEL ELIMINATION Goal: RH STG MANAGE BOWEL W/MEDICATION W/ASSISTANCE Description STG Manage Bowel with Medication with mod I Assistance.  Outcome: Progressing   Problem: RH BLADDER ELIMINATION Goal: RH STG MANAGE BLADDER WITH ASSISTANCE Description STG Manage Bladder With min Assistance  Outcome:  Progressing   Problem: RH SKIN INTEGRITY Goal: RH STG SKIN FREE OF INFECTION/BREAKDOWN Outcome: Progressing Goal: RH STG MAINTAIN SKIN INTEGRITY WITH ASSISTANCE Description STG Maintain Skin Integrity With  [place].  Outcome: Progressing Goal: RH STG ABLE TO PERFORM INCISION/WOUND CARE W/ASSISTANCE Description STG Able To Perform Incision/Wound Care With min Assistance.  Outcome: Progressing   Problem: RH SAFETY Goal: RH STG ADHERE TO SAFETY PRECAUTIONS W/ASSISTANCE/DEVICE Description STG Adhere to Safety Precautions With min Assistance/Device.  Outcome: Progressing   Problem: RH PAIN MANAGEMENT Goal: RH STG PAIN MANAGED AT OR BELOW PT'S PAIN GOAL Description Pain level less than 4 on 0-10 scale   Outcome: Progressing   Problem: RH KNOWLEDGE DEFICIT GENERAL Goal: RH STG INCREASE KNOWLEDGE OF SELF CARE AFTER HOSPITALIZATION Description Pt to verbalize 2 lifestyle modifiers related to controlling HTN along with medication compliance  Outcome: Progressing

## 2018-10-10 NOTE — Progress Notes (Signed)
Occupational Therapy Session Note  Patient Details  Name: Alice [name] MRN: [id number] Date of Birth: 1970/06/20  Today's Date: 10/10/2018 OT Individual Time: 1440-1525 OT Individual Time Calculation (min): 45 min    Short Term Goals: Week 1:  OT Short Term Goal 1 (Week 1): na STG = LTG  Skilled Therapeutic Interventions/Progress Updates:    Pt received supine with no c/o pain, wound vac beeping but RN aware. Pt completed stand pivot transfer to w/c with close (S). Pt propelled w/c ~40 ft before fatiguing and transport to gym provided. Pt was educated on and provided demonstration re use of TTB vs shower chair in tub. Safety awareness and fall prevention tips emphasized. Pt returned demonstration of tub transfer with CGA. Discussed ways to keep RLE wound dry if not yet allowed to get wet upon d/c home. Pt returned to room and transferred back to bed with (S). Pt left supine with all needs met, R LE elevated per request.  Therapy Documentation Precautions:  Precautions Precautions: Fall Precaution Comments: RLE wound vac Restrictions Weight Bearing Restrictions: Yes RLE Weight Bearing: Weight bearing as tolerated   Therapy/Group: Individual Therapy  Curtis Sites 10/10/2018, 7:31 AM

## 2018-10-10 NOTE — Patient Care Conference (Signed)
Inpatient RehabilitationTeam Conference and Plan of Care Update Date: 10/10/2018   Time: 2:30 PM    Patient Name: Alice [name]      Medical Record Number: [id number]  Date of Birth: 07/03/1970 Sex: Female         Room/Bed: 5N13C/5N13C-01 Payor Info: Payor: MEDICARE / Plan: MEDICARE PART A AND B / Product Type: *No Product type* /    Admitting Diagnosis: Gen Team  Debility, malaise; 11-12days  Admit Date/Time:  10/05/2018  3:44 PM Admission Comments: No comment available   Primary Diagnosis:  <principal problem not specified> Principal Problem: <principal problem not specified>  Patient Active Problem List   Diagnosis Date Noted  . Subtherapeutic international normalized ratio (INR)   . Postoperative pain   . Hypoalbuminemia   . CKD (chronic kidney disease), stage II   . Acute blood loss anemia   . Benign essential HTN   . Debility 10/05/2018  . Chronic ulcer of right leg, with fat layer exposed ([place]) 09/28/2018  . Chronic ulcer of leg, right, with necrosis of muscle ([place])   . Iron deficiency anemia 01/29/2018  . Pulmonary embolus ([place]) 01/09/2018  . Encounter for therapeutic drug monitoring 01/09/2018  . Severe malnutrition ([place]) 08/24/2017  . Acute kidney injury superimposed on CKD ([place]) 12/28/2016  . Hx of skin graft 08/01/2016  . 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 ([place]) 07/25/2016  . 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 Trichomonal infection 11/24/2015  . Abdominal pain 05/02/2015  . Symptomatic cholelithiasis 04/16/2015  . Acute bilateral upper abdominal pain 04/16/2015  . Microcytic anemia 05/18/2013  . Hypotension, unspecified 05/17/2013  . CKD (chronic kidney disease), stage III ([place]) 05/17/2013  . Hypokalemia 05/17/2013  . Anal fissure 02/06/2013  . Anal skin tag 02/06/2013  . ALLERGIC RHINITIS 03/05/2010  . LOW BACK PAIN SYNDROME 12/13/2007  . Local infection of  skin and subcutaneous tissue 10/10/2007  . OBESITY, MORBID 12/02/2006  . HTN (hypertension) 12/02/2006  . History of pulmonary embolism 12/02/2006  . History of DVT (deep vein thrombosis) 12/02/2006  . 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 GASTROESOPHAGEAL REFLUX DISEASE 12/02/2006  . OSA (obstructive sleep apnea) 12/02/2006    Expected Discharge Date: Expected Discharge Date: 10/13/18  Team Members Present: Physician leading conference: Dr. Delice Lesch Social Worker Present: Ovidio Kin, LCSW Nurse Present: Leonette Nutting, RN PT Present: Barrie Folk, PT OT Present: Simonne Come, OT SLP Present: Weston Anna, SLP PPS Coordinator present : Gunnar Fusi     Current Status/Progress Goal Weekly Team Focus  Medical   Functional deficits secondary to debility related to right leg wound with infection and associated complications  Improve mobility, pain, transfers, anticoagulation, ABLA, d/c VAC, CKD  See above   Bowel/Bladder   (P) Continent of Bowel/Bladder, LBM 10/08/18,  (P) Maintain contience  (P) QS assessment , encourage patient to drink po, and take schedule and unchance    Swallow/Nutrition/ Hydration             ADL's   CGA transfers with RW, Supervision/setup bathing and dressing  Mod I ADLs and IADLs except supervision for shower transfers  functional transfers, activity tolerance/endurance, pain management, d/c planning   Mobility   supervision with bed mobility supine to sit EOB with bed features (rail and HOB elevated, min guard assist for sit<>stand transfers and short distance gait. limited PT sessions today and yesterday due to medical issues-low Hgb.  supervision assist to Mod I  transfers, strengthening, gait and activity tolerance/endurance.    Communication             Safety/Cognition/ Behavioral Observations            Pain   (P) Rate surgical pain to Right LE 9-10/10 PRN medication Oxy Q4hrs and Ultam po,  Zanaflex Q8hrs prn  (P) < 3   (P) QS and prn monitoing with interventions, , continnue    Skin   (P) s/p I&D right LE wound ulcer, Negative pressure wound therapy in place drainnig seero-sang drainage , WOC to assess today,   Maintain surgical wound w/o complication or infection  QS,PRN assessment and evaluation       *See Care Plan and progress notes for long and short-term goals.     Barriers to Discharge  Current Status/Progress Possible Resolutions Date Resolved   Physician    Medical stability;Wound Care;Other (comments)     See above  Therapies, optimize pain meds, therapeutic INR, follow labs, D/c VAC on Friday, encourage fluids      Nursing                  PT                    OT                  SLP                SW                Discharge Planning/Teaching Needs:  Home alone with sister coming in to assist if needed. Pt hopeful she will be mod/i before going home. Concerned about her lost glasses on 6N      Team Discussion:  Goals supervision-mod/i level goals. Progressing toward this level. Resume coumadin. Wound vac on until Friday. Transfused Monday. Repeat labs tomorrow to check hemoglobin due to on coumadin now. Pain better now.   Revisions to Treatment Plan:  DC 5/30    Continued Need for Acute Rehabilitation Level of Care: The patient requires daily medical management by a physician with specialized training in physical medicine and rehabilitation for the following conditions: Daily direction of a multidisciplinary physical rehabilitation program to ensure safe treatment while eliciting the highest outcome that is of practical value to the patient.: Yes Daily medical management of patient stability for increased activity during participation in an intensive rehabilitation regime.: Yes Daily analysis of laboratory values and/or radiology reports with any subsequent need for medication adjustment of medical intervention for : Post surgical problems;Wound care problems;Other;Renal  problems   I attest that I was present, lead the team conference, and concur with the assessment and plan of the team. Teleconference held due to COVID 19   Dametria Tuzzolino, Gardiner Rhyme 10/10/2018, 2:33 PM

## 2018-10-10 NOTE — Progress Notes (Signed)
Physical Therapy Session Note  Patient Details  Name: Alice [name] MRN: [id number] Date of Birth: Mar 05, 1971  Today's Date: 10/10/2018 PT Individual Time: 1015-1100 and [id number] PT Individual Time Calculation (min): 45 min and 45 min    Short Term Goals: Week 1:  PT Short Term Goal 1 (Week 1): STG=LTG due to ELOS  Skilled Therapeutic Interventions/Progress Updates:  Session 1  Pt received sitting in recliner and agreeable to PT. Pt able to don R foot sock with supervisoin assist and increased time. Ambulatory transfer to [facility] with supervision assit and min cues for UE placement to stand.   WC mobility for UE endurance/strength trainng with supervision assist x 25f and 1 short rest break. Cues for turning technique in tight space of room.   Gait training with RW [id number] supervision assist from PT for safety with cues for Ad management in turns. Continues to have antalgic gait pattern.     Stair management training with 1 rail L to simulate home access with CGA and min cues for step to gait pattern and UE placement.   [facility] to [facility] and all needs met.     Session 2.  Pt received sitting EOB and agreeable to PT. Gait training through hall with supervision assist 2 x 60 with supervision assist and min cues for improved WB on the RLE as tolerated. All sit<>stand transfers with supervision from PT throughout treatment.   Sit<>standE from EB x 5 with supervision assist. Seated BLE therex LAQ, hip adduction/abduction, calf raises, reciprocal hip flexion. Each completed x 15 BL with mod cues for ROM, speed of movement and proper hold time for each exercise.   Standing balance to perform lateral reach to the R and L followed by horse shoe toss to target 2 x 8 Bil. Supervision assist with min cues for weight shifting R and L and use of UE as needed.   Pt returned to room and performed ambulatory transfer to bed with supervision assist and  RW. Sit>supine completed with supervision assist for safety, and left supine in bed with call bell in reach and all needs met.          Therapy Documentation Precautions:  Precautions Precautions: Fall Precaution Comments: RLE wound vac Restrictions Weight Bearing Restrictions: Yes RLE Weight Bearing: Weight bearing as tolerated    Pain: Pain Assessment Pain Scale: 0-10 Pain Score: 5  Pain Type: Chronic pain Pain Location: Leg Pain Orientation: Right Pain Descriptors / Indicators: Throbbing;Aching Pain Frequency: Constant Pain Onset: On-going Pain Intervention(s): Medication (See eMAR);Distraction;Emotional support;Relaxation;Rest    Therapy/Group: Individual Therapy  AuLorie Phenix/27/2020, 11:06 AM

## 2018-10-10 NOTE — Progress Notes (Signed)
Alice [name] for warfarin Indication: h/o DVT  Allergies  Allergen Reactions  . Vicodin [Hydrocodone-Acetaminophen] Nausea And Vomiting   Patient Measurements: Height: 5\' 8"  (172.7 cm) Weight: (!) 315 lb 11.2 oz (143.2 kg) IBW/kg (Calculated) : 63.9  Vital Signs: Temp: 98.1 F (36.7 C) (05/27 0518) Temp Source: Oral (05/27 0518) BP: 107/72 (05/27 0518) Pulse Rate: 98 (05/27 0518)  Labs: Recent Labs    10/08/18 0543 10/08/18 0931 10/09/18 1050 10/09/18 1811 10/10/18 0553  HGB 6.0* 6.7* 6.5*  --  7.4*  HCT 18.9* 21.0* 20.0*  --  22.6*  PLT 355 406* 420*  --  367  LABPROT  --   --   --  14.2 14.4  INR  --   --   --  1.1 1.1  CREATININE 1.18*  --   --   --   --    Estimated Creatinine Clearance: 89.9 mL/min (A) (by C-G formula based on SCr of 1.18 mg/dL (H)).  Medical History: Past Medical History:  Diagnosis Date  . Anemia   . Anxiety   . Arthritis   . Chronic bronchitis ([place])   . Chronic upper back pain   . Depression   . DVT (deep venous thrombosis) ([facility])    BLE  . GERD (gastroesophageal reflux disease)   . Headache    "weekly" (04/16/2015)  . Hypertension   . Migraine    "monthly" (04/16/2015)  . Morbid obesity with BMI of 50.0-59.9, adult ([place])   . Obesity   . Pneumonia 2014  . Pulmonary embolism ([place])   . Sinusitis nasal   . Sleep apnea    "I'm suppose to wear a mask but I don't" (04/16/2015)  . Varicose veins of right lower extremity   . Venous stasis of lower extremity    right  . Wears dentures   . Wears glasses    Assessment: 48 yo female with h/o DVT on warfarin prior to acute admit. Warfarin placed on hold due to bleeding from wound. OK obtained from Ortho to resume warfarin.  PTA dose was 7.5mg  daily except 10 mg on Wednesdays and Sundays. INR on acute admission was 2.7. INR today 1.1. Restart at home dose (of note patient to receive fluconazole x 1 today which may affect INR to some degree).   Goal of  Therapy:  INR 2-3 Monitor platelets by anticoagulation protocol: Yes   Today, 10/10/2018 INR remains 1.1, not surprising. Po intake 100% trays Hgb up to 7.4   Plan:  PT/INR daily Warfarin 7.5mg  PO x 1 tonight Monitor for further bleeding  Minda Ditto PharmD [phone] [place] Please utilize Amion for appropriate phone number to reach the unit pharmacist ([place]) 10/10/2018,2:06 PM

## 2018-10-10 NOTE — Progress Notes (Signed)
Notified on call Alice Chew, PA. She informed Probation officer to call OR or get a wound vac(the bigger one) settings at 125.   Pt was informed of the PA recommendation and was satisfied. Will continue to monitor and address any concerns that may arise.

## 2018-10-10 NOTE — Telephone Encounter (Signed)
Called and sw Olin Hauser to advise.

## 2018-10-10 NOTE — Progress Notes (Signed)
Patient wound vac is almost full, MD said it is going to be removed on Friday. It was charted yesterday 120 drainage, but today is maybe 140 all together  Meaning it drains 20 cc more. Patient complains about changing it. Will continue to monitor patient.

## 2018-10-10 NOTE — Progress Notes (Addendum)
During assessment, noted wound vac isn't operative. Patient noted she is "worried and feared that her wound will not heal properly. I contacted the nurse that works with Dr Sharol Given." Dr. Sharol Given text notes "do not let them remove your vac. They can get a new canister and call him, if they have questions."  Dr. Sharol Given personal cell number is noted in text and his office [phone].   The canister is full and non operative. Writer has called Camera operator, she was asked to come to floor. Day shift nurse noted she contacted the interdisciplinary team of the problem and was told to leave the wound vac in place resulted in the MD will look at it tomorrow.

## 2018-10-10 NOTE — Progress Notes (Signed)
Social Work Patient ID: Alice [name], female   DOB: 01/04/1971, 47 y.o.   MRN: [id number]  Met with pt to discuss team conference goals mod/-supervision and target discharge date 5/30. She was hoping to leave sooner but understands MD wants labs to look good and just resumed her coumadin. Pt feels this is what has made her feel bad today. Will work on discharge needs and have [facility] resume services.  

## 2018-10-10 NOTE — Progress Notes (Signed)
Occupational Therapy Session Note  Patient Details  Name: Alice [name] MRN: [id number] Date of Birth: [birth year]  Today's Date: 10/10/2018 OT Individual Time: 0805-0900 OT Individual Time Calculation (min): 55 min    Short Term Goals: Week 1:  OT Short Term Goal 1 (Week 1): na STG = LTG  Skilled Therapeutic Interventions/Progress Updates:    Treatment session with focus on ADL retraining and endurance.  Pt reports feeling much better this AM after receiving blood transfusion.  Pt ambulated around room with RW to gather clothing prior to engaging in bathing/dressing at sink.  Pt completed toilet transfer with RW with close supervision and completed toileting tasks at Mod I level.  All bathing, dressing, and grooming tasks completed with intermittent setup assist.  Pt reports feeling better this session than yesterday and hopeful for d/c home this week.  Therapy Documentation Precautions:  Precautions Precautions: Fall Precaution Comments: RLE wound vac Restrictions Weight Bearing Restrictions: Yes RLE Weight Bearing: Weight bearing as tolerated Pain: Pain Assessment Pain Scale: 0-10 Pain Score: 8 Pain Type: Chronic pain Pain Location: Leg Pain Orientation: Right Pain Descriptors / Indicators: Throbbing;Aching Pain Frequency: Constant Pain Onset: On-going Pain Intervention(s): Medicated at beginning of session.   Therapy/Group: Individual Therapy  Simonne Come 10/10/2018, 11:04 AM

## 2018-10-10 NOTE — Progress Notes (Signed)
Charge nurse was informed and ordered wound vac.

## 2018-10-11 ENCOUNTER — Inpatient Hospital Stay (HOSPITAL_COMMUNITY): Payer: Medicare Other | Admitting: Occupational Therapy

## 2018-10-11 ENCOUNTER — Inpatient Hospital Stay (HOSPITAL_COMMUNITY): Payer: Medicare Other

## 2018-10-11 ENCOUNTER — Inpatient Hospital Stay (HOSPITAL_COMMUNITY): Payer: Medicare Other | Admitting: Physical Therapy

## 2018-10-11 ENCOUNTER — Inpatient Hospital Stay: Payer: Medicare Other | Admitting: Orthopedic Surgery

## 2018-10-11 DIAGNOSIS — R0989 Other specified symptoms and signs involving the circulatory and respiratory systems: Secondary | ICD-10-CM

## 2018-10-11 LAB — CBC
HCT: 23.4 % — ABNORMAL LOW (ref 36.0–46.0)
Hemoglobin: 7.6 g/dL — ABNORMAL LOW (ref 12.0–15.0)
MCH: 24.5 pg — ABNORMAL LOW (ref 26.0–34.0)
MCHC: 32.5 g/dL (ref 30.0–36.0)
MCV: 75.5 fL — ABNORMAL LOW (ref 80.0–100.0)
Platelets: 383 10*3/uL (ref 150–400)
RBC: 3.1 MIL/uL — ABNORMAL LOW (ref 3.87–5.11)
RDW: 17.5 % — ABNORMAL HIGH (ref 11.5–15.5)
WBC: 10.3 10*3/uL (ref 4.0–10.5)
nRBC: 0 % (ref 0.0–0.2)

## 2018-10-11 LAB — CBC WITH DIFFERENTIAL/PLATELET
Abs Immature Granulocytes: 0.04 10*3/uL (ref 0.00–0.07)
Basophils Absolute: 0.1 10*3/uL (ref 0.0–0.1)
Basophils Relative: 1 %
Eosinophils Absolute: 0.3 10*3/uL (ref 0.0–0.5)
Eosinophils Relative: 3 %
HCT: 21 % — ABNORMAL LOW (ref 36.0–46.0)
Hemoglobin: 7 g/dL — ABNORMAL LOW (ref 12.0–15.0)
Immature Granulocytes: 1 %
Lymphocytes Relative: 21 %
Lymphs Abs: 1.8 10*3/uL (ref 0.7–4.0)
MCH: 25.1 pg — ABNORMAL LOW (ref 26.0–34.0)
MCHC: 33.3 g/dL (ref 30.0–36.0)
MCV: 75.3 fL — ABNORMAL LOW (ref 80.0–100.0)
Monocytes Absolute: 0.8 10*3/uL (ref 0.1–1.0)
Monocytes Relative: 10 %
Neutro Abs: 5.4 10*3/uL (ref 1.7–7.7)
Neutrophils Relative %: 64 %
Platelets: 356 10*3/uL (ref 150–400)
RBC: 2.79 MIL/uL — ABNORMAL LOW (ref 3.87–5.11)
RDW: 17.3 % — ABNORMAL HIGH (ref 11.5–15.5)
WBC: 8.4 10*3/uL (ref 4.0–10.5)
nRBC: 0 % (ref 0.0–0.2)

## 2018-10-11 LAB — PROTIME-INR
INR: 1.1 (ref 0.8–1.2)
Prothrombin Time: 14.4 seconds (ref 11.4–15.2)

## 2018-10-11 MED ORDER — WARFARIN SODIUM 7.5 MG PO TABS
15.0000 mg | ORAL_TABLET | Freq: Once | ORAL | Status: AC
  Administered 2018-10-11: 15 mg via ORAL
  Filled 2018-10-11: qty 2

## 2018-10-11 MED ORDER — POLYETHYLENE GLYCOL 3350 17 G PO PACK
17.0000 g | PACK | Freq: Every day | ORAL | Status: DC
  Administered 2018-10-13: 17 g via ORAL
  Filled 2018-10-11 (×3): qty 1

## 2018-10-11 MED ORDER — FLEET ENEMA 7-19 GM/118ML RE ENEM: 1.0000 | ENEMA | Freq: Once | RECTAL | Status: DC

## 2018-10-11 MED ORDER — POLYETHYLENE GLYCOL 3350 17 G PO PACK
17.0000 g | PACK | Freq: Once | ORAL | Status: DC
  Filled 2018-10-11: qty 1

## 2018-10-11 NOTE — Progress Notes (Signed)
Physical Therapy Session Note  Patient Details  Name: Alice [name] MRN: [id number] Date of Birth: December 23, 1970  Today's Date: 10/11/2018 PT Individual Time: 3735-7897 PT Individual Time Calculation (min): 30 min   Short Term Goals: Week 1:  PT Short Term Goal 1 (Week 1): STG=LTG due to ELOS  Skilled Therapeutic Interventions/Progress Updates:   Pt seen ambulating through hall without assist from staff. PT then found Pt sitting EOB with RN present reporting extreme pain. Pt labile reporting need for pain meds urgently. PT returned in 30 min and found pt in bathroom and agreeable to PT. Gait training throught room with RW and supervision assist x 49f. Seated BUE there with level 2 tband, mid rows, low rows, tricep extension, chest press, bicep curls, and shoulder press. Each completed x 12 Bil with cues for full ROM, proper speed and decreased compensations.  Sit>supine completed with supevrision assist, and left supine in bed with call bell in reach and all needs met.       Therapy Documentation Precautions:  Precautions Precautions: Fall Precaution Comments: RLE wound vac Restrictions Weight Bearing Restrictions: Yes RLE Weight Bearing: Weight bearing as tolerated Vital Signs: Therapy Vitals Temp: 98.6 F (37 C) Temp Source: Oral Pulse Rate: (!) 103 Resp: 17 BP: 136/79 Patient Position (if appropriate): Sitting Oxygen Therapy SpO2: 100 % O2 Device: Room Air Pain: Pain Assessment Pain Scale: 0-10 Pain Score: 10-Worst pain ever    Therapy/Group: Individual Therapy  ALorie Phenix5/28/2020, 5:54 PM

## 2018-10-11 NOTE — Plan of Care (Signed)
  Problem: RH SKIN INTEGRITY Goal: RH STG ABLE TO PERFORM INCISION/WOUND CARE W/ASSISTANCE Description STG Able To Perform Incision/Wound Care With min Assistance.  Outcome: Progressing  Wound vac at 125, functioning  Problem: RH PAIN MANAGEMENT Goal: RH STG PAIN MANAGED AT OR BELOW PT'S PAIN GOAL Description Pain level less than 4 on 0-10 scale   Outcome: Progressing  Administered pain regimen as needed  Problem: RH KNOWLEDGE DEFICIT GENERAL Goal: RH STG INCREASE KNOWLEDGE OF SELF CARE AFTER HOSPITALIZATION Description Pt to verbalize 2 lifestyle modifiers related to controlling HTN along with medication compliance  Outcome: Progressing  Educated on medication, wound/equipment,

## 2018-10-11 NOTE — Progress Notes (Signed)
Wound vac canister 100cc, the amount could be resulted in the length of time it was off.   It also appears in the text, " I'll need to stop her blood thinner, if she is still draining." Dr. Sharol Given.   There was no excessive drainage on pillow.

## 2018-10-11 NOTE — Sepsis Progress Note (Signed)
Patient still c/o constipation, Miralax  Given and dulcolax suppository applied. Awaiting on result. Will continue to monitor patient.

## 2018-10-11 NOTE — Progress Notes (Signed)
Physical Therapy Session Note  Patient Details  Name: Alice [name] MRN: [id number] Date of Birth: 01/09/1971  Today's Date: 10/11/2018 PT Individual Time: 9937-1696 PT Individual Time Calculation (min): 75 min   Short Term Goals: Week 1:  PT Short Term Goal 1 (Week 1): STG=LTG due to ELOS  Skilled Therapeutic Interventions/Progress Updates:    Patient asleep upon my entry.  Reports had medication earlier and poorly slept due to pain and had to have wound vac change due to not working.  Patient supine to sit S.  Sit to stand S and ambulated to bathroom with RW and S. Toileted mod I with RW and grabbar.  Donned pants and shirt seated on toilet with A only for vac tubing into pants.  Standing at sink to wash hands and face with S.  Patient ambulated x 150' with w/c following,    Seated on mat for rest, then sit to stand x 10, seated hip flexion x 20, knee extension with 5 sec hold x 10.  Standing horse shoe reaching and toss with S no UE support. Forward and side steps up on 4: step with 1 UE support x 10 each.    Assisted in w/c to gym to practice steps with 1 handrail x 4 with CGA and cues for sequence.  Obtained different bariatric RW more like what she will take home from gym and pt ambulated x 49' with S.  Assisted in w/c to room.  Stand step to bed with S with RW.  Sit to supine for leg elevation with S.  Left with call bell in reach and bed alarm activated.   Therapy Documentation Precautions:  Precautions Precautions: Fall Precaution Comments: RLE wound vac Restrictions Weight Bearing Restrictions: Yes RLE Weight Bearing: Weight bearing as tolerated Pain: Pain Assessment Pain Scale: 0-10 Pain Score: 7  Pain Type: Chronic pain Pain Location: Leg Pain Orientation: Right Pain Descriptors / Indicators: Aching;Throbbing Pain Frequency: Constant Pain Onset: On-going Pain Intervention(s): Repositioned    Other Treatments:      Therapy/Group: Individual Therapy  Reginia Naas   Magda Kiel, PT 10/11/2018, 10:16 AM

## 2018-10-11 NOTE — Plan of Care (Signed)
  Problem: Consults Goal: RH GENERAL PATIENT EDUCATION Description See Patient Education module for education specifics. Outcome: Progressing   Problem: RH BOWEL ELIMINATION Goal: RH STG MANAGE BOWEL WITH ASSISTANCE Description STG Manage Bowel with min Assistance.  Outcome: Progressing Goal: RH STG MANAGE BOWEL W/MEDICATION W/ASSISTANCE Description STG Manage Bowel with Medication with mod I Assistance.  Outcome: Progressing   Problem: RH BLADDER ELIMINATION Goal: RH STG MANAGE BLADDER WITH ASSISTANCE Description STG Manage Bladder With min Assistance  Outcome: Progressing   Problem: RH SKIN INTEGRITY Goal: RH STG SKIN FREE OF INFECTION/BREAKDOWN Outcome: Progressing Goal: RH STG MAINTAIN SKIN INTEGRITY WITH ASSISTANCE Description STG Maintain Skin Integrity With  [place].  Outcome: Progressing   Problem: RH SAFETY Goal: RH STG ADHERE TO SAFETY PRECAUTIONS W/ASSISTANCE/DEVICE Description STG Adhere to Safety Precautions With min Assistance/Device.  Outcome: Progressing   Problem: RH PAIN MANAGEMENT Goal: RH STG PAIN MANAGED AT OR BELOW PT'S PAIN GOAL Description Pain level less than 4 on 0-10 scale   Outcome: Progressing   Problem: RH KNOWLEDGE DEFICIT GENERAL Goal: RH STG INCREASE KNOWLEDGE OF SELF CARE AFTER HOSPITALIZATION Description Pt to verbalize 2 lifestyle modifiers related to controlling HTN along with medication compliance  Outcome: Progressing

## 2018-10-11 NOTE — Progress Notes (Signed)
Patient had a small BM after Dulcolax and Miralax. Enema order for patient, patient states she is tired she is going to have it at night. Will report off to the night nurse. Will continue to monitor patient.

## 2018-10-11 NOTE — Progress Notes (Signed)
Patient ID: Alice [name], female   DOB: Apr 03, 1971, 48 y.o.   MRN: [id number] Patient has 100 cc of clear drainage in the wound VAC canister she is back on her blood thinners no sign of active bleeding.  Patient states that she is being discharged on Saturday she request that the wound VAC be changed and removed on Friday.  I will follow-up on Friday to remove the wound VAC dressing.  Again reinforced the importance of elevation.  We will then follow-up in the office on Monday to start compression dressing changes.

## 2018-10-11 NOTE — Progress Notes (Signed)
[place] for warfarin Indication: h/o DVT  Allergies  Allergen Reactions  . Vicodin [Hydrocodone-Acetaminophen] Nausea And Vomiting   Patient Measurements: Height: 5\' 8"  (172.7 cm) Weight: (!) 315 lb 11.2 oz (143.2 kg) IBW/kg (Calculated) : 63.9  Vital Signs: BP: 92/74 (05/28 0749) Pulse Rate: 67 (05/28 0749)  Labs: Recent Labs    10/09/18 1050 10/09/18 1811 10/10/18 0553 10/11/18 0506  HGB 6.5*  --  7.4* 7.0*  HCT 20.0*  --  22.6* 21.0*  PLT 420*  --  367 356  LABPROT  --  14.2 14.4 14.4  INR  --  1.1 1.1 1.1   Estimated Creatinine Clearance: 89.9 mL/min (A) (by C-G formula based on SCr of 1.18 mg/dL (H)).  Medical History: Past Medical History:  Diagnosis Date  . Anemia   . Anxiety   . Arthritis   . Chronic bronchitis ([place])   . Chronic upper back pain   . Depression   . DVT (deep venous thrombosis) ([facility])    BLE  . GERD (gastroesophageal reflux disease)   . Headache    "weekly" (04/16/2015)  . Hypertension   . Migraine    "monthly" (04/16/2015)  . Morbid obesity with BMI of 50.0-59.9, adult ([place])   . Obesity   . Pneumonia 2014  . Pulmonary embolism ([place])   . Sinusitis nasal   . Sleep apnea    "I'm suppose to wear a mask but I don't" (04/16/2015)  . Varicose veins of right lower extremity   . Venous stasis of lower extremity    right  . Wears dentures   . Wears glasses    Assessment: 48 yo female with h/o DVT on warfarin prior to acute admit. Warfarin placed on hold due to bleeding from wound. OK obtained from Ortho to resume warfarin.  PTA dose was 7.5mg  daily except 10 mg on Wednesdays and Sundays. INR on acute admission was 2.7. INR today 1.1. Restart at home dose (of note patient to receive fluconazole x 1 today which may affect INR to some degree).   Goal of Therapy:  INR 2-3 Monitor platelets by anticoagulation protocol: Yes   Today, 10/11/2018 INR remains 1.1 after 2 x 7.5mg  Warfarin Po intake 100%  trays Hgb 7.0, transfused 5/25 & 26 Wound VAC   Plan:  PT/INR daily Warfarin 15mg  PO x 1 today Monitor for further bleeding  Minda Ditto PharmD [phone] Clinical Pharmacist Please utilize Amion for appropriate phone number to reach the unit pharmacist ([place]) 10/11/2018,7:59 AM

## 2018-10-11 NOTE — Progress Notes (Signed)
Occupational Therapy Session Note  Patient Details  Name: Alice [name] MRN: [id number] Date of Birth: Feb 05, 1971  Today's Date: 10/11/2018 OT Individual Time: 1050-1200 OT Individual Time Calculation (min): 70 min    Short Term Goals: Week 1:  OT Short Term Goal 1 (Week 1): na STG = LTG      Skilled Therapeutic Interventions/Progress Updates:    Pt seen this session for ADL training with a focus on endurance and balance. Pt was able to get out of bed and ambulate to bathroom with RW with S only.  She sat on BSC at sink (she still has wound vac on) to bathe with set up. Pt had gathered her own clothing so she was able to dress independently.  Pt takes extra time to complete tasks.  Pt discussed her low hemoglobin levels and her worries that she will not be medically ready to leave Saturday. Pt is very emotional and anxious to leave.  Assured pt the MD will keep her updated.  Counseled with pt on the challenges she has had recently, pt is taking a very proactive approach with diet changes, etc.   Pt resting in room with all needs met.  Therapy Documentation Precautions:  Precautions Precautions: Fall Precaution Comments: RLE wound vac Restrictions Weight Bearing Restrictions: Yes RLE Weight Bearing: Weight bearing as tolerated    Pain: Pain Assessment Pain Score: 7  Pain Type: Chronic pain Pain Location: Leg Pain Orientation: Right Pain Descriptors / Indicators: Aching;Throbbing Pain Onset: On-going Pain Intervention(s): Repositioned    Therapy/Group: Individual Therapy  [place] 10/11/2018, 12:29 PM

## 2018-10-11 NOTE — Progress Notes (Signed)
[facility] PHYSICAL MEDICINE & REHABILITATION PROGRESS NOTE   Subjective/Complaints: Patient seen laying in the bed this morning.  She states she slept well overnight after her back was changed.  Discussed low blood pressure with nursing this morning, holding antihypertensives.  She was seen by Ortho this AM.  Review of systems: Denies CP, SOB, N/V/D.   Objective:   No results found. Recent Labs    10/10/18 0553 10/11/18 0506  WBC 9.3 8.4  HGB 7.4* 7.0*  HCT 22.6* 21.0*  PLT 367 356   No results for input(s): NA, K, CL, CO2, GLUCOSE, BUN, CREATININE, CALCIUM in the last 72 hours.  Intake/Output Summary (Last 24 hours) at 10/11/2018 0933 Last data filed at 10/11/2018 0916 Gross per 24 hour  Intake 1149 ml  Output 20 ml  Net 1129 ml     Physical Exam: Vital Signs Blood pressure 92/74, pulse 67, temperature 98.1 F (36.7 C), temperature source Oral, resp. rate 16, height 5\' 8"  (1.727 m), weight (!) 143.2 kg, SpO2 100 %. Constitutional: No distress . Vital signs reviewed. Obese HENT: Normocephalic.  Atraumatic. Eyes: EOMI.  No discharge. Cardiovascular: No JVD. Respiratory: Normal effort. GI: Non-distended. Musc: RLE with edema and tenderness Neurological: She is alertand oriented Motor: B/l UE 5/5.  RLE: Hip flexion, knee extension 4-/5, ankle dorsiflexion 3+/5, some pain inhibition, improving.  LLE: 4+/5 proximal to distal  Skin: RLE with VAC. Psychiatric: She has anormal mood and affect. Herbehavior is normal.Judgmentand thought contentnormal.   Assessment/Plan: 1. Functional deficits secondary to debility peripheral vascular disease which require 3+ hours per day of interdisciplinary therapy in a comprehensive inpatient rehab setting.  Physiatrist is providing close team supervision and 24 hour management of active medical problems listed below.  Physiatrist and rehab team continue to assess barriers to discharge/monitor patient progress toward functional and  medical goals  Care Tool:  Bathing    Body parts bathed by patient: Right arm, Left arm, Chest, Abdomen, Front perineal area, Buttocks, Right upper leg, Left upper leg, Right lower leg, Left lower leg, Face         Bathing assist Assist Level: Set up assist     Upper Body Dressing/Undressing Upper body dressing   What is the patient wearing?: Bra, Pull over shirt    Upper body assist Assist Level: Set up assist    Lower Body Dressing/Undressing Lower body dressing      What is the patient wearing?: Pants     Lower body assist Assist for lower body dressing: Set up assist     Toileting Toileting    Toileting assist Assist for toileting: Independent with assistive device Assistive Device Comment: Front wheel walker   Transfers Chair/bed transfer  Transfers assist     Chair/bed transfer assist level: Supervision/Verbal cueing     Locomotion Ambulation   Ambulation assist      Assist level: Supervision/Verbal cueing Assistive device: Walker-rolling Max distance: 90   Walk 10 feet activity   Assist     Assist level: Supervision/Verbal cueing Assistive device: Walker-rolling   Walk 50 feet activity   Assist Walk 50 feet with 2 turns activity did not occur: Safety/medical concerns  Assist level: Supervision/Verbal cueing Assistive device: Walker-rolling    Walk 150 feet activity   Assist Walk 150 feet activity did not occur: Safety/medical concerns         Walk 10 feet on uneven surface  activity   Assist Walk 10 feet on uneven surfaces activity did not occur: Safety/medical  concerns         Wheelchair     Assist   Type of Wheelchair: Manual    Wheelchair assist level: Supervision/Verbal cueing Max wheelchair distance: 254ft    Wheelchair 50 feet with 2 turns activity    Assist        Assist Level: Supervision/Verbal cueing   Wheelchair 150 feet activity     Assist     Assist Level:  Supervision/Verbal cueing    Medical Problem List and Plan: 1.Functional deficitssecondary to debility related to right leg wound with infection and associated complications  Cont CIR 2.H/o DVT/Antithrombotics: -DVT/anticoagulation:Pharmaceutical:Coumadin restarted per Ortho   INR subtherapeutic on 5/28 -antiplatelet therapy: N/A 3. Pain Management:Oxycodone prn.  Relatively controlled on 5/28 4. Mood:LCSW to follow for evaluation and support. -antipsychotic agents: N/A 5. Neuropsych: This patientiscapable of making decisions on herown behalf. 6. Skin/Wound Care:Wound VAC changed to [facility], changed back to [facility] and to remain in place until 10/12/18, and to be evaluated by Ortho 7. Fluids/Electrolytes/Nutrition:Monitor I/Osand encourage appropriate PO intake. 8. HTN: Monitor BP bid. Continue HCTZ and Norvasc.  Vitals:   10/11/18 0524 10/11/18 0749  BP: 108/74 92/74  Pulse: 92 67  Resp: 18 16  Temp:    SpO2: 99% 100%   Slightly labile on 5/28 - holding BP meds today  Encourage fluid intake 9. Morbid obesity: BMI 53. Educate on appropriate diet and weight loss to help promote mobility.  10. OSA: Noncompliant with CPAP 11. H/O depression: Continue Wellbutrin. 12. 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 Hypoalbuminemia  Supplement initiated on 5/25  LOS: 6 days A FACE TO FACE EVALUATION WAS PERFORMED  Jamier Urbas [name] 10/11/2018, 9:33 AM

## 2018-10-12 ENCOUNTER — Inpatient Hospital Stay (HOSPITAL_COMMUNITY): Payer: Medicare Other | Admitting: Physical Therapy

## 2018-10-12 ENCOUNTER — Other Ambulatory Visit: Payer: Self-pay | Admitting: Physical Medicine and Rehabilitation

## 2018-10-12 ENCOUNTER — Inpatient Hospital Stay (HOSPITAL_COMMUNITY): Payer: Medicare Other | Admitting: Occupational Therapy

## 2018-10-12 DIAGNOSIS — N179 Acute kidney failure, unspecified: Secondary | ICD-10-CM

## 2018-10-12 LAB — CBC
HCT: 20.6 % — ABNORMAL LOW (ref 36.0–46.0)
Hemoglobin: 6.6 g/dL — CL (ref 12.0–15.0)
MCH: 24.4 pg — ABNORMAL LOW (ref 26.0–34.0)
MCHC: 32 g/dL (ref 30.0–36.0)
MCV: 76.3 fL — ABNORMAL LOW (ref 80.0–100.0)
Platelets: 333 10*3/uL (ref 150–400)
RBC: 2.7 MIL/uL — ABNORMAL LOW (ref 3.87–5.11)
RDW: 17.7 % — ABNORMAL HIGH (ref 11.5–15.5)
WBC: 7.8 10*3/uL (ref 4.0–10.5)
nRBC: 0 % (ref 0.0–0.2)

## 2018-10-12 LAB — BASIC METABOLIC PANEL
Anion gap: 11 (ref 5–15)
BUN: 30 mg/dL — ABNORMAL HIGH (ref 6–20)
CO2: 24 mmol/L (ref 22–32)
Calcium: 8.8 mg/dL — ABNORMAL LOW (ref 8.9–10.3)
Chloride: 102 mmol/L (ref 98–111)
Creatinine, Ser: 1.56 mg/dL — ABNORMAL HIGH (ref 0.44–1.00)
GFR calc Af Amer: 46 mL/min — ABNORMAL LOW (ref >60)
GFR calc non Af Amer: 39 mL/min — ABNORMAL LOW (ref >60)
Glucose, Bld: 114 mg/dL — ABNORMAL HIGH (ref 70–99)
Potassium: 3.9 mmol/L (ref 3.5–5.1)
Sodium: 137 mmol/L (ref 135–145)

## 2018-10-12 LAB — HEMOGLOBIN AND HEMATOCRIT, BLOOD
HCT: 22.7 % — ABNORMAL LOW (ref 36.0–46.0)
Hemoglobin: 7.4 g/dL — ABNORMAL LOW (ref 12.0–15.0)

## 2018-10-12 LAB — RETICULOCYTES
Immature Retic Fract: 24.1 % — ABNORMAL HIGH (ref 2.3–15.9)
RBC.: 2.91 MIL/uL — ABNORMAL LOW (ref 3.87–5.11)
Retic Count, Absolute: 69.3 10*3/uL (ref 19.0–186.0)
Retic Ct Pct: 2.4 % (ref 0.4–3.1)

## 2018-10-12 LAB — PROTIME-INR
INR: 1.3 — ABNORMAL HIGH (ref 0.8–1.2)
Prothrombin Time: 15.5 seconds — ABNORMAL HIGH (ref 11.4–15.2)

## 2018-10-12 LAB — PREPARE RBC (CROSSMATCH)

## 2018-10-12 MED ORDER — LIDOCAINE-EPINEPHRINE 1 %-1:100000 IJ SOLN
20.0000 mL | Freq: Once | INTRAMUSCULAR | Status: AC
  Administered 2018-10-12: 20 mL via INTRADERMAL
  Filled 2018-10-12 (×3): qty 20

## 2018-10-12 MED ORDER — PRO-STAT SUGAR FREE PO LIQD: 30.0000 mL | Freq: Two times a day (BID) | ORAL | 0 refills | Status: DC

## 2018-10-12 MED ORDER — ACETAMINOPHEN 325 MG PO TABS: 325.0000 mg | ORAL_TABLET | ORAL | Status: DC | PRN

## 2018-10-12 MED ORDER — FERROUS SULFATE 325 (65 FE) MG PO TABS: 325.0000 mg | ORAL_TABLET | Freq: Every day | ORAL | 3 refills | Status: DC

## 2018-10-12 MED ORDER — DOCUSATE SODIUM 100 MG PO CAPS: 100.0000 mg | ORAL_CAPSULE | Freq: Two times a day (BID) | ORAL | 0 refills | Status: DC

## 2018-10-12 MED ORDER — FERROUS SULFATE 325 (65 FE) MG PO TABS
325.0000 mg | ORAL_TABLET | Freq: Every day | ORAL | Status: DC
  Administered 2018-10-13 – 2018-10-14 (×2): 325 mg via ORAL
  Filled 2018-10-12 (×2): qty 1

## 2018-10-12 MED ORDER — HYDROCERIN EX CREA: 1.0000 "application " | TOPICAL_CREAM | Freq: Two times a day (BID) | CUTANEOUS | 0 refills | Status: DC

## 2018-10-12 MED ORDER — SENNOSIDES-DOCUSATE SODIUM 8.6-50 MG PO TABS: 2.0000 | ORAL_TABLET | Freq: Two times a day (BID) | ORAL | 0 refills | Status: DC

## 2018-10-12 MED ORDER — FUROSEMIDE 10 MG/ML IJ SOLN
20.0000 mg | Freq: Once | INTRAMUSCULAR | Status: AC
  Administered 2018-10-12: 20 mg via INTRAVENOUS
  Filled 2018-10-12: qty 2

## 2018-10-12 MED ORDER — TRAMADOL HCL 50 MG PO TABS: 50.0000 mg | ORAL_TABLET | Freq: Four times a day (QID) | ORAL | 0 refills | Status: DC | PRN

## 2018-10-12 MED ORDER — WARFARIN SODIUM 5 MG PO TABS: 10.0000 mg | ORAL_TABLET | Freq: Every day | ORAL | 0 refills | Status: DC

## 2018-10-12 MED ORDER — ACETAMINOPHEN 325 MG PO TABS
650.0000 mg | ORAL_TABLET | Freq: Once | ORAL | Status: AC
  Administered 2018-10-12: 650 mg via ORAL
  Filled 2018-10-12: qty 2

## 2018-10-12 MED ORDER — OXYCODONE HCL 10 MG PO TABS: 10.0000 mg | ORAL_TABLET | Freq: Four times a day (QID) | ORAL | 0 refills | Status: DC | PRN

## 2018-10-12 MED ORDER — DIPHENHYDRAMINE HCL 25 MG PO CAPS
25.0000 mg | ORAL_CAPSULE | Freq: Once | ORAL | Status: AC
  Administered 2018-10-12: 25 mg via ORAL
  Filled 2018-10-12: qty 1

## 2018-10-12 MED ORDER — WARFARIN SODIUM 7.5 MG PO TABS
15.0000 mg | ORAL_TABLET | Freq: Once | ORAL | Status: AC
  Administered 2018-10-12: 15 mg via ORAL
  Filled 2018-10-12: qty 2

## 2018-10-12 MED ORDER — SODIUM CHLORIDE 0.9 % IV BOLUS
1000.0000 mL | Freq: Once | INTRAVENOUS | Status: AC
  Administered 2018-10-12: 1000 mL via INTRAVENOUS

## 2018-10-12 MED ORDER — POLYETHYLENE GLYCOL 3350 17 G PO PACK: 17.0000 g | PACK | Freq: Every day | ORAL | 0 refills | Status: DC

## 2018-10-12 MED ORDER — MORPHINE SULFATE (PF) 2 MG/ML IV SOLN
2.0000 mg | Freq: Once | INTRAVENOUS | Status: AC
  Administered 2018-10-12: 2 mg via INTRAVENOUS
  Filled 2018-10-12: qty 1

## 2018-10-12 MED ORDER — SODIUM CHLORIDE 0.9% IV SOLUTION
Freq: Once | INTRAVENOUS | Status: AC
  Administered 2018-10-12: 16:00:00 via INTRAVENOUS

## 2018-10-12 NOTE — Progress Notes (Signed)
Spoke with PCP's nurse. Patient with history of anemia with last Hgb 10.1 a month ago. INR subtherapeutic--will discharge on 10 mg daily. To have CBC/INR rechecked at PCP office on Monday morning and follow up with Dr. Sharol Given on Tue/Wed next week.

## 2018-10-12 NOTE — Progress Notes (Signed)
Social Work  Discharge Note  The overall goal for the admission was met for: DC SAT 5/30  Discharge location: B and E IN TO ASSIST HER  Length of Stay: Yes-7 DAYS  Discharge activity level: Yes-INDEPENDENT WITH DEVICE  Home/community participation: Yes  Services provided included: MD, RD, PT, OT, RN, CM, Pharmacy and SW  Financial Services: Medicare and Medicaid  Follow-up services arranged: Home Health: [facility], DME: [facility] and Patient/Family request agency HH: [facility]FORE, DME: [facility]  Comments (or additional information): PT DID WELL BUT STRUGGLED WITH HER HEMOGLOBIN AND NEEDING MULTIPLE BLOOD TRANSFUSIONS WHILE HERE. HER SISTER AND AUNT WILL BE ASSISTING HER AT HOME. RN MANAGER AWARE OF LOST GLASSES ON 6N  Patient/Family verbalized understanding of follow-up arrangements: Yes  Individual responsible for coordination of the follow-up plan: SELF & CONSTANCE-SISTER  Confirmed correct DME delivered: Elease Hashimoto 10/12/2018    Elease Hashimoto

## 2018-10-12 NOTE — Progress Notes (Signed)
Occupational Therapy Session Note  Patient Details  Name: KATURAH KARAPETIAN MRN: [id number] Date of Birth: [birth year]  Today's Date: 10/12/2018 OT Individual Time: 1040-1110 OT Individual Time Calculation (min): 30 min  and Today's Date: 10/12/2018 OT Missed Time: 24 Minutes Missed Time Reason: Nursing care;Patient ill (comment)   Short Term Goals: Week 1:  OT Short Term Goal 1 (Week 1): na STG = LTG      Skilled Therapeutic Interventions/Progress Updates:    Pt received in bed, crying and upset about the possibility of not going home tomorrow due to low hemoglobin.  Pt stated her leg was in a lot of pain.  Nursing and ortho PA preparing pt to have wound vac removed.   Pt did want to toilet first.  Pt is mod I with her self care but provided S for pt as she ambulated to toilet, toileted, sat at sink for oral care and grooming for safety as pt has critically low Hbg values.   Pt got back into bed as PA was ready to work with patient.   Therapy Documentation Precautions:  Precautions Precautions: Fall Precaution Comments: RLE wound vac Restrictions Weight Bearing Restrictions: Yes RLE Weight Bearing: Weight bearing as tolerated General: General OT Amount of Missed Time: 45 Minutes    Pain: Pain Assessment Pain Scale: 0-10 Pain Score: 10-Worst pain ever(++ wound VAC just removed) Pain Type: Surgical pain;Acute pain Pain Location: Leg Pain Orientation: Right Pain Descriptors / Indicators: Burning Pain Frequency: Constant Pain Onset: On-going Patients Stated Pain Goal: 4 Pain Intervention(s): Medication (See eMAR)   Therapy/Group: Individual Therapy  [place] 10/12/2018, 12:05 PM

## 2018-10-12 NOTE — Discharge Summary (Addendum)
Physician Discharge Summary  Patient ID: Alice [name] MRN: [id number] DOB/AGE: 11-25-1970 48 y.o.  Admit date: 10/05/2018 Discharge date: 10/14/2018  Discharge Diagnoses:  Principal Problem:   Debility Active Problems:   Iron deficiency anemia   Hypoalbuminemia   Acute on chronic kidney failure (HCC)   Acute blood loss anemia   Benign essential HTN   Postoperative pain   Subtherapeutic international normalized ratio (INR)   Discharged Condition: stable   Significant Diagnostic Studies: [place] Ekg Site Rite  Result Date: 10/05/2018 If Site Rite image not attached, placement could not be confirmed due to current cardiac rhythm.   Labs:  Basic Metabolic Panel: BMP Latest Ref Rng & Units 10/12/2018 10/08/2018 10/02/2018  Glucose 70 - 99 mg/dL 114(H) 96 -  BUN 6 - 20 mg/dL 30(H) 19 -  Creatinine 0.44 - 1.00 mg/dL 1.56(H) 1.18(H) 1.27(H)  Sodium 135 - 145 mmol/L 137 139 -  Potassium 3.5 - 5.1 mmol/L 3.9 3.7 -  Chloride 98 - 111 mmol/L 102 102 -  CO2 22 - 32 mmol/L 24 27 -  Calcium 8.9 - 10.3 mg/dL 8.8(L) 8.7(L) -    CBC: Recent Labs  Lab 10/09/18 1050 10/10/18 0553 10/11/18 0506 10/11/18 1547 10/12/18 0446 10/12/18 0653 10/13/18 0741  WBC 9.5 9.3 8.4 10.3 7.8  --  8.4  NEUTROABS 6.0 5.4 5.4  --   --   --   --   HGB 6.5* 7.4* 7.0* 7.6* 6.6* 7.4* 9.9*  HCT 20.0* 22.6* 21.0* 23.4* 20.6* 22.7* 30.3*  MCV 75.2* 76.6* 75.3* 75.5* 76.3*  --  77.3*  PLT 420* 367 356 383 333  --  389    CBG: No results for input(s): GLUCAP in the last 168 hours.    Brief HPI:   Alice [name] is a 48 year old female with history of HTN orbit obesity RLE ulcers with drainage and ongoing conservative therapy.  She was admitted on 09/28/2018 with increasing drainage and underwent I&D by Dr. Sharol Given.  Postop treated with IV antibiotics and taken back to the OR on 09/2018 for I&D with placement of ACell and wound VAC.  Cultures positive for Proteus mirabilis, staph A and Serratia marcescens.  Consulted and  felt that as wound was showing improvement antibiotics could be discontinued.  Therapy was ongoing and patient was noted to be limited by pain, anxiety and debility.  CIR was recommended due to functional decline.   Hospital Course: Alice [name] was admitted to rehab 10/05/2018 for inpatient therapies to consist of PT and OT at least three hours five days a week. Past admission physiatrist, therapy team and rehab RN have worked together to provide customized collaborative inpatient rehab.  Post op labs done on 526 and revealed acute on chronic anemia with hemoglobin down to 6.5.  She was transfused with 1 unit packed red blood cells with improvement in H&H to 7.0.  Serial H&H has been monitored and she did have a recurrent drop requiring total of 3 total packed red blood cells during her stay.  Most recent CBC of 530 shows H&H improved at 9.9 and 30.3.  She was started on iron supplement and is to have follow-up CBC monitored by primary MD.  She did develop acute on chronic renal failure and was gently hydrated with 1 liter of IV fluid.  Blood pressures have been monitored on daily basis and she continues on home dose HCTZ and Norvasc.  She has had issues with constipation however has not been compliant with laxative regimen.  She continued to have drainage into her wound VAC. This was kept in place till 5/30. VAC removed by ortho revealing healthy beefy red tissue.  This was covered with Mepitel and patient advised to change dressings covering this twice daily.  She is follow-up with Dr. Sharol Given in 2 to 3 days for wound monitoring.  She was advised to elevate lower extremity whenever up to help with edema control.  Pain has been managed with as needed use of oxycodone.  Dr. Sharol Given recommended holding Coumadin for a few additional days due to excessive bleeding perioperatively.  This was resumed on 05/27 and INR at discharge is 1.9.  She is to have INR rechecked by primary MD on 6/1. as indicated dose  Due to issues  with anemia as well as weakness length of stay was extended by 1 day to help her reach modified independent goals.  She will continue to receive further follow-up home health PT, OT and RN by [facility] past discharge.   Rehab course: During patient's stay in rehab weekly team conferences were held to monitor patient's progress, set goals and discuss barriers to discharge. At admission, patient required min assist with basic self-care tasks and with mobility. She  has had improvement in activity tolerance, balance, postural control as well as ability to compensate for deficits.  She is able to complete ADL tasks at modified independent level.  She is modified independent for transfers and is able to ambulate 60 feet with a rolling walker    Disposition: Home  Diet: Low-fat, low-cholesterol, low-salt.  Special Instructions: 1.  Elevate right lower extremity when seated. 2.  He is to get up and move around every hour. 3.  INR to be checked at primary MD on 6/1.  Will need CBC repeated in 1 to 2 weeks for follow-up on anemia 4.  Keep Mepitel in place and change dressings on top twice a day.    Discharge Instructions    Ambulatory referral to Physical Medicine Rehab   Complete by:  As directed    1-2 weeks transitional care appt     Allergies as of 10/12/2018      Reactions   Vicodin hydrocodone-acetaminophen Nausea And Vomiting      Medication List    STOP taking these medications   DULoxetine 30 MG capsule Commonly known as:  CYMBALTA   phentermine 37.5 MG tablet Commonly known as:  ADIPEX-P     TAKE these medications   acetaminophen 325 MG tablet Commonly known as:  TYLENOL Take 1-2 tablets (325-650 mg total) by mouth every 4 (four) hours as needed for mild pain.   albuterol 108 (90 Base) MCG/ACT inhaler Commonly known as:  VENTOLIN HFA Inhale 2 puffs into the lungs every 6 (six) hours as needed for wheezing or shortness of breath.   amLODipine 10 MG  tablet Commonly known as:  NORVASC Take 1 tablet (10 mg total) by mouth daily.   buPROPion 300 MG 24 hr tablet Commonly known as:  WELLBUTRIN XL Take 300 mg by mouth daily.   docusate sodium 100 MG capsule Commonly known as:  COLACE Take 1 capsule (100 mg total) by mouth 2 (two) times daily. Notes to patient:  Stool softner for constipation   feeding supplement (PRO-STAT SUGAR FREE 64) Liqd Take 30 mLs by mouth 2 (two) times daily.   ferrous sulfate 325 (65 FE) MG tablet Take 1 tablet (325 mg total) by mouth daily with breakfast. Start taking on:  Oct 13, 2018   hydrocerin Crea Apply 1 application topically 2 (two) times daily. Notes to patient:  To dry skin   hydrochlorothiazide 25 MG tablet Commonly known as:  HYDRODIURIL Take 25 mg by mouth daily.   multivitamin with minerals Tabs tablet Take 1 tablet by mouth daily.   nitroGLYCERIN 0.4 MG SL tablet Commonly known as:  NITROSTAT Place 1 tablet (0.4 mg total) under the tongue every 5 (five) minutes x 3 doses as needed for chest pain.   Oxycodone HCl 10 MG Tabs--Rx # 29 pills  Take 1 tablet (10 mg total) by mouth every 6 (six) hours as needed for severe pain.   pantoprazole 40 MG tablet Commonly known as:  PROTONIX Take 1 tablet (40 mg total) by mouth daily. For acid reflux   pentoxifylline 400 MG CR tablet Commonly known as:  TRENTAL TAKE 1 TABLET(400 MG) BY MOUTH THREE TIMES DAILY WITH MEALS   polyethylene glycol 17 g packet Commonly known as:  MIRALAX / GLYCOLAX Take 17 g by mouth daily. Start taking on:  Oct 13, 2018 Notes to patient:  Take one to twice a day for constipation   pregabalin 200 MG capsule Commonly known as:  LYRICA Take 200 mg by mouth 2 (two) times a day.   senna-docusate 8.6-50 MG tablet Commonly known as:  Senokot-S Take 2 tablets by mouth 2 (two) times daily.   tiZANidine 4 MG tablet Commonly known as:  ZANAFLEX Take 4-8 mg by mouth every 8 (eight) hours as needed for muscle spasms  (pain).   traMADol 50 MG tablet---Rx # 28 pills Commonly known as:  ULTRAM Take 1 tablet (50 mg total) by mouth every 6 (six) hours as needed for moderate pain.   warfarin 5 MG tablet Commonly known as:  Coumadin Take as directed. If you are unsure how to take this medication, talk to your nurse or doctor. Original instructions:  Take 2 tablets (10 mg total) by mouth daily at 6 PM.      Follow-up Information    Osei-Bonsu, Iona Beard, MD Follow up on 10/15/2018.   Specialty:  Internal Medicine Why:  Be there at 11:15 am --needs CBC/INR rechecked and dosed. You have to keep this appointment Contact information: [address] [place] [address] [phone]        Jamse Arn, MD Follow up.   Specialty:  Physical Medicine and Rehabilitation Why:  Office will call you with follow up appointment Contact information: [address] [place] [address] [phone]        [name], MD. Call on 10/15/2018.   Specialty:  Orthopedic Surgery Why:  for follow up appointment Contact information: [place] [address] [phone]           Signed: Bary Leriche 10/15/2018, 4:38 PM Patient seen and examined by me on day of discharge. Delice Lesch, MD, ABPMR

## 2018-10-12 NOTE — Progress Notes (Signed)
[place] for warfarin Indication: h/o DVT  Allergies  Allergen Reactions  . Vicodin [Hydrocodone-Acetaminophen] Nausea And Vomiting   Patient Measurements: Height: 5\' 8"  (172.7 cm) Weight: (!) 315 lb 11.2 oz (143.2 kg) IBW/kg (Calculated) : 63.9  Vital Signs: Temp: 98.4 F (36.9 C) (05/29 0443) Temp Source: Oral (05/29 0443) BP: 98/75 (05/29 0443) Pulse Rate: 82 (05/29 0443)  Labs: Recent Labs    10/10/18 0553 10/11/18 0506 10/11/18 1547 10/12/18 0446 10/12/18 0653  HGB 7.4* 7.0* 7.6* 6.6* 7.4*  HCT 22.6* 21.0* 23.4* 20.6* 22.7*  PLT 367 356 383 333  --   LABPROT 14.4 14.4  --  15.5*  --   INR 1.1 1.1  --  1.3*  --   CREATININE  --   --   --  1.56*  --    Estimated Creatinine Clearance: 68 mL/min (A) (by C-G formula based on SCr of 1.56 mg/dL (H)).  Medical History: Past Medical History:  Diagnosis Date  . Anemia   . Anxiety   . Arthritis   . Chronic bronchitis ([facility])   . Chronic upper back pain   . Depression   . DVT (deep venous thrombosis) (HCC)    BLE  . GERD (gastroesophageal reflux disease)   . Headache    "weekly" (04/16/2015)  . Hypertension   . Migraine    "monthly" (04/16/2015)  . Morbid obesity with BMI of 50.0-59.9, adult ([facility])   . Obesity   . Pneumonia 2014  . Pulmonary embolism ([facility])   . Sinusitis nasal   . Sleep apnea    "I'm suppose to wear a mask but I don't" (04/16/2015)  . Varicose veins of right lower extremity   . Venous stasis of lower extremity    right  . Wears dentures   . Wears glasses    Assessment: 48 yo female with h/o DVT on warfarin prior to acute admit. Warfarin placed on hold due to bleeding from wound. OK obtained from Ortho to resume warfarin.  PTA dose was 7.5mg  daily except 10 mg on Wednesdays and Sundays. INR on acute admission was 2.7. INR today 1.1. Restart at home dose (of note patient to receive fluconazole x 1 today which may affect INR to some degree).   Goal of  Therapy:  INR 2-3 Monitor platelets by anticoagulation protocol: Yes   Today, 10/12/2018 INR 1.3 today, after 2 x 7.5mg , 15mg  Warfarin Po intake 100% trays Hgb 7.4, transfused 5/25 & 26 Wound VAC   Plan:  PT/INR daily Warfarin 15mg  PO x 1 today Monitor for further bleeding  Minda Ditto PharmD [phone] Clinical Pharmacist Please utilize Amion for appropriate phone number to reach the unit pharmacist ([place]) 10/12/2018,8:24 AM

## 2018-10-12 NOTE — Discharge Instructions (Signed)
Inpatient Rehab Discharge Instructions  CIEL CHERVENAK Discharge date and time: 10/13/18   Activities/Precautions/ Functional Status: Activity: no lifting, driving, or strenuous exercise till cleared by MD Diet: low fat, low cholesterol diet Wound Care: Keep Mepitel that's on the wound in place. Change abd pads 1-2 times a day to keep incision dry and cover with compressive dressing.   Functional status:  ___ No restrictions     ___ Walk up steps independently ___ 24/7 supervision/assistance   ___ Walk up steps with assistance _X__ Intermittent supervision/assistance  _X__ Bathe/dress independently ___ Walk with walker     ___ Bathe/dress with assistance ___ Walk Independently    ___ Shower independently ___ Walk with assistance    ___ Shower with assistance _X__ No alcohol     ___ Return to work/school ________   Special Instructions: 1.Need to keep right leg elevated when seated or in bed.  1. Stay out of bed. Need to get up and walk around the house every hour .    COMMUNITY REFERRALS UPON DISCHARGE:    Home Health:   PT & RN   [facility]   KHVFM:[phone]   Date of last service:10/13/2018  Medical Equipment/Items Ordered:[facility] [place]  Agency/Supplier:[facility]   [id number]    My questions have been answered and I understand these instructions. I will adhere to these goals and the provided educational materials after my discharge from the hospital.  Patient/Caregiver Signature _______________________________ Date __________  Clinician Signature _______________________________________ Date __________  Please bring this form and your medication list with you to all your follow-up doctor's appointments.

## 2018-10-12 NOTE — Progress Notes (Signed)
Subjective:    Patient reports pain as severe.   Patient pre medicated with 2 mg morphine prior to removal of VAC dressing.  BP was a little low earlier, but 120/70's prior to morphine and dressing removal.  Nursing reports the [facility] drainage has increased over the past 24 hours as well.   Objective: Vital signs in last 24 hours: Temp:  [97 F (36.1 C)-98.6 F (37 C)] 98.4 F (36.9 C) (05/29 0443) Pulse Rate:  [82-103] 82 (05/29 0443) Resp:  [17-20] 20 (05/29 0443) BP: (98-136)/(75-86) 98/75 (05/29 0443) SpO2:  [98 %-100 %] 98 % (05/29 0443)  Intake/Output from previous day: 05/28 0701 - 05/29 0700 In: 1079 [P.O.:1029] Out: 150 [Drains:150] Intake/Output this shift: No intake/output data recorded.  Recent Labs    10/10/18 0553 10/11/18 0506 10/11/18 1547 10/12/18 0446 10/12/18 0653  HGB 7.4* 7.0* 7.6* 6.6* 7.4*   Recent Labs    10/11/18 1547 10/12/18 0446 10/12/18 0653  WBC 10.3 7.8  --   RBC 3.10* 2.70*  --   HCT 23.4* 20.6* 22.7*  PLT 383 333  --    Recent Labs    10/12/18 0446  NA 137  K 3.9  CL 102  CO2 24  BUN 30*  CREATININE 1.56*  GLUCOSE 114*  CALCIUM 8.8*   Recent Labs    10/11/18 0506 10/12/18 0446  INR 1.1 1.3*    VAC dressing removed and excellent beefy pink granulation present without signs of infection or odor, peri wound intact without signs of cellulitis or breakdown.    Assessment/Plan:    Will need BID dressing changes and okay for DC home . Patient has Dynaflex dressings available at home and will plan for early follow up next week in the office for Dynaflex compression wraps.    Erlinda Hong, PA-C 10/12/2018, 11:49 AM  [employer] [phone]

## 2018-10-12 NOTE — Progress Notes (Signed)
Occupational Therapy Session Note  Patient Details  Name: Alice [name] MRN: [id number] Date of Birth: [birth year]  Today's Date: 10/12/2018 OT Individual Time: 3646-8032 OT Individual Time Calculation (min): 35 min  and Today's Date: 10/12/2018 OT Missed Time: 25 Minutes Missed Time Reason: Other (comment)(tearful; low hemoglobin)   Short Term Goals: Week 1:  OT Short Term Goal 1 (Week 1): na STG = LTG  Skilled Therapeutic Interventions/Progress Updates:    Pt received in bed, crying and upset due to low hemoglobin and concerned why it keeps happening and when she will be able to go home.  Utilized therapeutic use of self allowing pt to discuss her fears and concerns.  Declined OOB activity at this time due to low hemoglobin and pain, deferring to next therapy session.  Notified SWK of current medical status, questioning pending d/c.  Therapy Documentation Precautions:  Precautions Precautions: Fall Precaution Comments: RLE wound vac Restrictions Weight Bearing Restrictions: Yes RLE Weight Bearing: Weight bearing as tolerated General: General OT Amount of Missed Time: 25 Minutes Pain: Pt with c/o pain in RLE, not rated.  RN notified   Therapy/Group: Individual Therapy  Simonne Come 10/12/2018, 1:36 PM

## 2018-10-12 NOTE — Significant Event (Signed)
CRITICAL VALUE ALERT  Critical Value:  HGB 6.6  Date & Time Notied:  10/12/2018 @ 4688  Provider Notified: Reesa Chew PA-C  Orders Received/Actions taken: Repeat H&H, Type-screen and cross match for 2 units of PRBCs.  Notify provider of repeat H&H before blood transfusion.

## 2018-10-12 NOTE — Progress Notes (Signed)
Physical Therapy Session Note  Patient Details  Name: Alice [name] MRN: [id number] Date of Birth: 1970/06/08  Today's Date: 10/12/2018 PT Individual Time: 1600-1700 PT Individual Time Calculation (min): 60 min   Short Term Goals: Week 1:  PT Short Term Goal 1 (Week 1): STG=LTG due to ELOS  Skilled Therapeutic Interventions/Progress Updates:   Pt received supine in bed and agreeable to PT treatment at bed level only after significant encouragement from PT. Pt questioning readiness to d/c due to low Hgb levels and receiving multiple rounds of PRBC while in hospital, while also reporting that she just want to go home. PT encouraged OOB activity but pt decline, becoming emotional while expressing pain in RLE while removing wound vac earlier in the day. Therapeutic use of self by this PT to educate pt on rehab process of importance of safety over comfort with wound care management. PT spoke with PA, who agreed that additional day of therapy to ensure normalized lab values and full modified independent level as pt will be d/c home without 24/7 supervision. Pt agreeable to UE therex, but  Continues to refuse LE there, with fear of pain in the RLE. All completed with level 2 tband, chest press, mid row, bicep curl, tricep extension, lat pull down, overhead press. All completed  2 x 12 with cues for decreased speed of eccentric movement and full ROM. Pt left in bed with call bell in place and all needs met.       Therapy Documentation Precautions:  Precautions Precautions: Fall Precaution Comments: RLE wound vac Restrictions Weight Bearing Restrictions: Yes RLE Weight Bearing: Weight bearing as tolerated    Vital Signs: Therapy Vitals Temp: 98.4 F (36.9 C) Temp Source: Oral Pulse Rate: 83 Resp: 16 BP: 127/83 Patient Position (if appropriate): Lying Oxygen Therapy SpO2: 100 % O2 Device: Room Air Pain: 8/10 RLE. Aching. See MAR  Therapy/Group: Individual Therapy  [name] 10/12/2018, 5:31 PM

## 2018-10-12 NOTE — Progress Notes (Signed)
[place] PHYSICAL MEDICINE & REHABILITATION PROGRESS NOTE   Subjective/Complaints: Patient seen laying in bed this morning.  She states she slept well overnight.  Discussed wound VAC, patient willing to maintain VAC now.  She continues to have significant output.  She is to be evaluated by Ortho today.  Hemoglobin noted to be critically low early this a.m.  Repeat ordered.  Plan for transfusion.  Discussed with PA.  Review of systems: Denies CP, SOB, N/V/D.   Objective:   No results found. Recent Labs    10/11/18 1547 10/12/18 0446 10/12/18 0653  WBC 10.3 7.8  --   HGB 7.6* 6.6* 7.4*  HCT 23.4* 20.6* 22.7*  PLT 383 333  --    Recent Labs    10/12/18 0446  NA 137  K 3.9  CL 102  CO2 24  GLUCOSE 114*  BUN 30*  CREATININE 1.56*  CALCIUM 8.8*    Intake/Output Summary (Last 24 hours) at 10/12/2018 0955 Last data filed at 10/12/2018 0700 Gross per 24 hour  Intake 829 ml  Output 150 ml  Net 679 ml     Physical Exam: Vital Signs Blood pressure 98/75, pulse 82, temperature 98.4 F (36.9 C), temperature source Oral, resp. rate 20, height 5\' 8"  (1.727 m), weight (!) 143.2 kg, SpO2 98 %. Constitutional: No distress . Vital signs reviewed. Obese HENT: Normocephalic.  Atraumatic. Eyes: EOMI.  No discharge. Cardiovascular: No JVD. Respiratory: Normal effort. GI: Non-distended. Musc: RLE with edema and tenderness Neurological: She is alertand oriented Motor: B/l UE 5/5.  RLE: Hip flexion, knee extension 4-/5, ankle dorsiflexion 3+/5, some pain inhibition, unchanged.  LLE: 4+/5 proximal to distal, unchanged Skin: RLE with VAC-suctioning. Psychiatric: She has anormal mood and affect. Herbehavior is normal.Judgmentand thought contentnormal.   Assessment/Plan: 1. Functional deficits secondary to debility peripheral vascular disease which require 3+ hours per day of interdisciplinary therapy in a comprehensive inpatient rehab setting.  Physiatrist is providing close  team supervision and 24 hour management of active medical problems listed below.  Physiatrist and rehab team continue to assess barriers to discharge/monitor patient progress toward functional and medical goals  Care Tool:  Bathing    Body parts bathed by patient: Right arm, Left arm, Chest, Abdomen, Front perineal area, Buttocks, Right upper leg, Left upper leg, Right lower leg, Left lower leg, Face         Bathing assist Assist Level: Set up assist     Upper Body Dressing/Undressing Upper body dressing   What is the patient wearing?: Bra, Pull over shirt    Upper body assist Assist Level: Independent    Lower Body Dressing/Undressing Lower body dressing      What is the patient wearing?: Pants     Lower body assist Assist for lower body dressing: Independent     Toileting Toileting    Toileting assist Assist for toileting: Independent with assistive device Assistive Device Comment: Front wheel walker   Transfers Chair/bed transfer  Transfers assist     Chair/bed transfer assist level: Supervision/Verbal cueing     Locomotion Ambulation   Ambulation assist      Assist level: Supervision/Verbal cueing Assistive device: Walker-rolling Max distance: 150'   Walk 10 feet activity   Assist     Assist level: Supervision/Verbal cueing Assistive device: Walker-rolling   Walk 50 feet activity   Assist Walk 50 feet with 2 turns activity did not occur: Safety/medical concerns  Assist level: Supervision/Verbal cueing Assistive device: Walker-rolling    Walk 150  feet activity   Assist Walk 150 feet activity did not occur: Safety/medical concerns  Assist level: Supervision/Verbal cueing Assistive device: Walker-rolling    Walk 10 feet on uneven surface  activity   Assist Walk 10 feet on uneven surfaces activity did not occur: Safety/medical concerns         Wheelchair     Assist   Type of Wheelchair: Manual    Wheelchair  assist level: Supervision/Verbal cueing Max wheelchair distance: 242ft    Wheelchair 50 feet with 2 turns activity    Assist        Assist Level: Supervision/Verbal cueing   Wheelchair 150 feet activity     Assist     Assist Level: Supervision/Verbal cueing    Medical Problem List and Plan: 1.Functional deficitssecondary to debility related to right leg wound with infection and associated complications  Cont CIR  Plan for d/c tomorrow  Will see patient for transitional care management in 1-2 weeks post-discharge 2.H/o DVT/Antithrombotics: -DVT/anticoagulation:Pharmaceutical:Coumadin restarted per Ortho   INR subtherapeutic on 5/29 -antiplatelet therapy: N/A 3. Pain Management:Oxycodone prn.  Relatively controlled on 5/28 4. Mood:LCSW to follow for evaluation and support. -antipsychotic agents: N/A  5. Neuropsych: This patientiscapable of making decisions on herown behalf. 6. Skin/Wound Care:Wound VAC changed to [place], changed back to VAC.  Plan was to DC today, however continues to have significant output.  Patient to be evaluated by Ortho today with determination for wound VAC. 7. Fluids/Electrolytes/Nutrition:Monitor I/Osand encourage appropriate PO intake. 8. HTN: Monitor BP bid. Continue HCTZ and Norvasc.  Vitals:   10/11/18 2040 10/12/18 0443  BP: 123/86 98/75  Pulse: 94 82  Resp: 20 20  Temp: (!) 97 F (36.1 C) 98.4 F (36.9 C)  SpO2: 100% 98%   Labile on 5/29  Encourage fluid intake 9. Morbid obesity: BMI 53. Educate on appropriate diet and weight loss to help promote mobility.  10. OSA: Noncompliant with CPAP 11. H/O depression: Continue Wellbutrin. 12. ABLA  Hb critical value of 6.6 earlier this a.m., repeat ordered, improved, will plan to transfuse 1 unit given discharge tomorrow  Status post transfusion on 5/25 13.  AKI on CKD, stage II  Cr 1.56 on 5/29  Will give 1 L IV fluid bolus, echo from 2019  reviewed-50-55% EF  Cont to monitor 14. Hypoalbuminemia  Supplement initiated on 5/25  LOS: 7 days A FACE TO FACE EVALUATION WAS PERFORMED   [name] 10/12/2018, 9:55 AM

## 2018-10-13 ENCOUNTER — Inpatient Hospital Stay (HOSPITAL_COMMUNITY): Payer: Medicare Other

## 2018-10-13 ENCOUNTER — Inpatient Hospital Stay (HOSPITAL_COMMUNITY): Payer: Medicare Other | Admitting: Occupational Therapy

## 2018-10-13 LAB — CBC
HCT: 30.3 % — ABNORMAL LOW (ref 36.0–46.0)
Hemoglobin: 9.9 g/dL — ABNORMAL LOW (ref 12.0–15.0)
MCH: 25.3 pg — ABNORMAL LOW (ref 26.0–34.0)
MCHC: 32.7 g/dL (ref 30.0–36.0)
MCV: 77.3 fL — ABNORMAL LOW (ref 80.0–100.0)
Platelets: 389 10*3/uL (ref 150–400)
RBC: 3.92 MIL/uL (ref 3.87–5.11)
RDW: 17.4 % — ABNORMAL HIGH (ref 11.5–15.5)
WBC: 8.4 10*3/uL (ref 4.0–10.5)
nRBC: 0 % (ref 0.0–0.2)

## 2018-10-13 LAB — TYPE AND SCREEN
ABO/RH(D): AB POS
Antibody Screen: NEGATIVE
Unit division: 0

## 2018-10-13 LAB — BPAM RBC
Blood Product Expiration Date: 202006052359
ISSUE DATE / TIME: 202005291529
Unit Type and Rh: 6200

## 2018-10-13 LAB — PROTIME-INR
INR: 1.4 — ABNORMAL HIGH (ref 0.8–1.2)
Prothrombin Time: 17 seconds — ABNORMAL HIGH (ref 11.4–15.2)

## 2018-10-13 MED ORDER — WARFARIN SODIUM 7.5 MG PO TABS
15.0000 mg | ORAL_TABLET | Freq: Once | ORAL | Status: AC
  Administered 2018-10-13: 15 mg via ORAL
  Filled 2018-10-13: qty 2

## 2018-10-13 NOTE — Progress Notes (Signed)
Occupational Therapy Discharge Summary  Patient Details  Name: Alice [name] MRN: [id number] Date of Birth: 07-05-1970  Today's Date: 10/13/2018 OT Individual Time: 4196-2229 OT Individual Time Calculation (min): 41 min   Session Note:  Pt completed toilet transfer and toileting with modified independence using the RW and grab bars.  She then completed all bathing, dressing, and grooming with modified independence from seated position on a 3:1 at the sink.  She was able to clean up all dirty towels with use of the RW at completion of ADL.  Discussed the need for a walker bag to assist with transport of items from one place to the other so that she is not trying to hold onto the walker with something in her hand.  Also discussed setup of tub bench at home as it has been delivered to her room but has not been put together.  Finished session with call button and phone in reach with pt sitting in bedside recliner.    Patient has met 9 of 9 long term goals due to improved activity tolerance, improved balance and ability to compensate for deficits.  Patient to discharge at overall Modified Independent level.  Patient's care partner is independent to provide the necessary physical assistance at discharge.    Reasons goals not met: NA  Recommendation:  Patient will benefit from ongoing skilled OT services in home health setting to continue to advance functional skills in the area of BADL.  Equipment: tub bench  Reasons for discharge: treatment goals met and discharge from hospital  Patient/family agrees with progress made and goals achieved: Yes  OT Discharge Precautions/Restrictions  Precautions Precautions: Fall Precaution Comments: RLE wound vac Restrictions Weight Bearing Restrictions: No RLE Weight Bearing: Weight bearing as tolerated  Pain Pain Assessment Pain Scale: Faces Faces Pain Scale: Hurts a little bit Pain Type: Surgical pain Pain Location: Leg Pain Orientation:  Right Pain Descriptors / Indicators: Discomfort Pain Onset: On-going Pain Intervention(s): Repositioned;Emotional support ADL ADL Eating: Independent Where Assessed-Eating: Chair Grooming: Independent Where Assessed-Grooming: Sitting at sink Upper Body Bathing: Setup Where Assessed-Upper Body Bathing: Sitting at sink Lower Body Bathing: Modified independent Where Assessed-Lower Body Bathing: Sitting at sink Upper Body Dressing: Independent Where Assessed-Upper Body Dressing: Sitting at sink Lower Body Dressing: Modified independent Where Assessed-Lower Body Dressing: Sitting at sink, Standing at sink Toileting: Modified independent Where Assessed-Toileting: Glass blower/designer: Diplomatic Services operational officer Method: Counselling psychologist: Ambulance person Transfer: Close supervison Clinical cytogeneticist Method: Optometrist: Radio broadcast assistant Vision Baseline Vision/History: Wears glasses Wears Glasses: At all times Patient Visual Report: No change from baseline Vision Assessment?: No apparent visual deficits Perception  Perception: Within Functional Limits Praxis Praxis: Intact Cognition Overall Cognitive Status: Within Functional Limits for tasks assessed Arousal/Alertness: Awake/alert Orientation Level: Oriented X4 Attention: Selective Focused Attention: Appears intact Sustained Attention: Appears intact Selective Attention: Appears intact Memory: Appears intact Awareness: Appears intact Problem Solving: Appears intact Safety/Judgment: Appears intact Sensation Sensation Light Touch: Appears Intact Hot/Cold: Appears Intact Proprioception: Appears Intact Stereognosis: Appears Intact Coordination Gross Motor Movements are Fluid and Coordinated: Yes Fine Motor Movements are Fluid and Coordinated: Yes Finger Nose Finger Test: [facility] Heel Shin Test: limited by body habitus  Motor  Motor Motor: Within Functional  Limits Mobility  Bed Mobility Bed Mobility: Rolling Right;Rolling Left;Supine to Sit;Sit to Supine Rolling Right: Independent Rolling Left: Independent Supine to Sit: Independent Sit to Supine: Independent Transfers Sit to Stand: Independent with assistive device  Trunk/Postural Assessment  Cervical  Assessment Cervical Assessment: Within Functional Limits Thoracic Assessment Thoracic Assessment: Within Functional Limits Lumbar Assessment Lumbar Assessment: Within Functional Limits Postural Control Postural Control: Within Functional Limits  Balance Balance Balance Assessed: Yes Static Sitting Balance Static Sitting - Level of Assistance: 6: Modified independent (Device/Increase time) Dynamic Sitting Balance Dynamic Sitting - Level of Assistance: 6: Modified independent (Device/Increase time) Static Standing Balance Static Standing - Level of Assistance: 6: Modified independent (Device/Increase time) Dynamic Standing Balance Dynamic Standing - Level of Assistance: 6: Modified independent (Device/Increase time) Extremity/Trunk Assessment RUE Assessment RUE Assessment: Within Functional Limits General Strength Comments: 5/5 LUE Assessment LUE Assessment: Within Functional Limits General Strength Comments: 5/5   Jenisha Faison OTR/L 10/13/2018, 4:24 PM

## 2018-10-13 NOTE — Progress Notes (Signed)
Physical Therapy Discharge Summary  Patient Details  Name: Alice [name] MRN: [id number] Date of Birth: 09-17-1970  Today's Date: 10/13/2018 PT Individual Time: 8144-8185 PT Individual Time Calculation (min): 40 min  and Today's Date: 10/13/2018 PT Missed Time: 20 Minutes Missed Time Reason: Pain   Patient has met 10 of 10 long term goals due to improved activity tolerance, improved balance, increased strength and ability to compensate for deficits.  Patient to discharge at an ambulatory level Modified Independent.   Patient's care partner is independent to provide the necessary physical assistance at discharge as needed, otherwise pt is Mod I for mobility.  All goals met. W/c propulsion goals not applicable as this pt will be ambulatory at home with RW.   Recommendation:  Patient will benefit from ongoing skilled PT services in home health setting to continue to advance safe functional mobility, address ongoing impairments in strength, balance, and minimize fall risk.  Equipment: RW  Reasons for discharge: treatment goals met  Patient/family agrees with progress made and goals achieved: Yes   PT Treatment Interventions: Upon PT arrival pt reports that she is in 10/10 pain and just received a suppository from RN. Pt reports she is unable to participate in therapy at this time due to pain. Pt missed 20 minutes of skilled therapy tx this morning secondary to pain. This therapist will re-attempt in the afternoon.   Therapist returned in the afternoon to re-attempt session with pt. Pt seated in recliner upon PT arrival, agreeable to therapy tx and reports pain 10/10 in RLE, pt also frustrated about being constipated. Pt transferred sit<>stand mod I and ambulated x 60 ft this session with RW and mod I, limited by pain. Pt transferred from recliner>w/c mod I with RW and transported to gym. Pt ascended/descended 4 steps this session per home set up using L handrail with B UE support on single  rail, CGA with cues for step to pattern and cues for which leg to lead with. Pt transported back to room, pt transferred to bed and performed bed mobility Mod I, supine<>sitting. Pt requesting to get back into recliner. Pt ambulated x 10 ft to the recliner with RW and mod I. Pt left in recliner at end of session with needs in reach.   PT Discharge Precautions/Restrictions Precautions Precautions: Fall Restrictions Weight Bearing Restrictions: Yes RLE Weight Bearing: Weight bearing as tolerated Vital Signs Therapy Vitals BP: (!) 142/80 Pain  reports 10/10 pain in RLE, RN present to administer pain medication Cognition Overall Cognitive Status: Within Functional Limits for tasks assessed Arousal/Alertness: Awake/alert Orientation Level: Oriented X4 Attention: Selective Focused Attention: Appears intact Sustained Attention: Appears intact Selective Attention: Appears intact Memory: Appears intact Awareness: Appears intact Problem Solving: Appears intact Safety/Judgment: Appears intact Sensation Sensation Light Touch: Appears Intact Proprioception: Appears Intact Coordination Gross Motor Movements are Fluid and Coordinated: Yes Fine Motor Movements are Fluid and Coordinated: Yes Finger Nose Finger Test: [facility] Heel Shin Test: limited by body habitus  Motor  Motor Motor: Within Functional Limits  Mobility Bed Mobility Bed Mobility: Rolling Right;Rolling Left;Supine to Sit;Sit to Supine Rolling Right: Independent Rolling Left: Independent Supine to Sit: Independent Sit to Supine: Independent Transfers Transfers: Sit to [employer] Transfers Sit to Stand: Independent with assistive device Stand Pivot Transfers: Independent with assistive device Transfer (Assistive device): Rolling walker Locomotion  Gait Ambulation: Yes Gait Assistance: Independent with assistive device Gait Distance (Feet): 60 Feet Assistive device: Rolling walker Gait Assistance Details: Verbal  cues for technique;Verbal cues for  gait pattern;Verbal cues for precautions/safety Gait Gait: Yes Gait Pattern: Antalgic;Right steppage Stairs / Additional Locomotion Stairs: Yes Stairs Assistance: Contact Guard/Touching assist Stair Management Technique: One rail Left Number of Stairs: 4 Height of Stairs: 6 Wheelchair Mobility Wheelchair Mobility: No  Trunk/Postural Assessment  Cervical Assessment Cervical Assessment: Within Functional Limits Thoracic Assessment Thoracic Assessment: Within Functional Limits Lumbar Assessment Lumbar Assessment: Within Functional Limits Postural Control Postural Control: Within Functional Limits  Balance Balance Balance Assessed: Yes Static Sitting Balance Static Sitting - Level of Assistance: 6: Modified independent (Device/Increase time) Dynamic Sitting Balance Dynamic Sitting - Level of Assistance: 6: Modified independent (Device/Increase time) Static Standing Balance Static Standing - Level of Assistance: 6: Modified independent (Device/Increase time) Dynamic Standing Balance Dynamic Standing - Level of Assistance: 6: Modified independent (Device/Increase time) Extremity Assessment  RLE Assessment General Strength Comments: hip grossly 4/5. knee flexion and extension grossly 4-/5. ankle at least 3/5. pain limitingt knee and ankle MMT.  LLE Assessment LLE Assessment: Within Functional Limits General Strength Comments: grossly 4/5 to 4+/5 proximal to distal     Netta Corrigan, PT, DPT 10/13/2018, 1:16 PM

## 2018-10-13 NOTE — Progress Notes (Signed)
Reesa Chew, PA would like for first shift to follow-up on the whereabouts of her glasses

## 2018-10-13 NOTE — Progress Notes (Signed)
[place] for warfarin Indication: h/o DVT  Allergies  Allergen Reactions  . Vicodin [Hydrocodone-Acetaminophen] Nausea And Vomiting   Patient Measurements: Height: 5\' 8"  (172.7 cm) Weight: (!) 315 lb 11.2 oz (143.2 kg) IBW/kg (Calculated) : 63.9  Vital Signs: Temp: 98.2 F (36.8 C) (05/30 0521) Temp Source: Oral (05/30 0521) BP: 139/81 (05/30 0521) Pulse Rate: 105 (05/30 0521)  Labs: Recent Labs    10/11/18 0506 10/11/18 1547 10/12/18 0446 10/12/18 0653 10/13/18 0741  HGB 7.0* 7.6* 6.6* 7.4* 9.9*  HCT 21.0* 23.4* 20.6* 22.7* 30.3*  PLT 356 383 333  --  389  LABPROT 14.4  --  15.5*  --  17.0*  INR 1.1  --  1.3*  --  1.4*  CREATININE  --   --  1.56*  --   --    Estimated Creatinine Clearance: 68 mL/min (A) (by C-G formula based on SCr of 1.56 mg/dL (H)).  Medical History: Past Medical History:  Diagnosis Date  . Anemia   . Anxiety   . Arthritis   . Chronic bronchitis ([place])   . Chronic upper back pain   . Depression   . DVT (deep venous thrombosis) ([facility])    BLE  . GERD (gastroesophageal reflux disease)   . Headache    "weekly" (04/16/2015)  . Hypertension   . Migraine    "monthly" (04/16/2015)  . Morbid obesity with BMI of 50.0-59.9, adult (Alice [name])   . Obesity   . Pneumonia 2014  . Pulmonary embolism ([place])   . Sinusitis nasal   . Sleep apnea    "I'm suppose to wear a mask but I don't" (04/16/2015)  . Varicose veins of right lower extremity   . Venous stasis of lower extremity    right  . Wears dentures   . Wears glasses    Assessment: 48 yo female with h/o DVT on warfarin prior to acute admit. Warfarin placed on hold due to bleeding from wound. OK obtained from Ortho to resume warfarin.  PTA dose was 7.5mg  daily except 10 mg on Wednesdays and Sundays. INR on acute admission was 2.7. Restart at home dose (of note patient to receive fluconazole x 1 today which may affect INR to some degree).   Goal of Therapy:  INR  2-3 Monitor platelets by anticoagulation protocol: Yes   Today, 10/13/2018 INR 1.4 today, after 2 x 7.5mg , 2 x 15mg  Warfarin Po intake 100% trays Hgb 7.4, transfused 5/25 & 26 Wound VAC   Plan:  PT/INR daily Warfarin 15mg  PO x 1 today Monitor for further bleeding  Minda Ditto PharmD [phone] Clinical Pharmacist Please utilize [facility] for appropriate phone number to reach the unit pharmacist ([place]) 10/13/2018,9:05 AM

## 2018-10-13 NOTE — Plan of Care (Signed)
  Problem: RH PAIN MANAGEMENT Goal: RH STG PAIN MANAGED AT OR BELOW PT'S PAIN GOAL Description Pain level less than 4 on 0-10 scale   Outcome: Progressing  Administered pain regimen with a prn regimen and scheduled meds  Problem: RH KNOWLEDGE DEFICIT GENERAL Goal: RH STG INCREASE KNOWLEDGE OF SELF CARE AFTER HOSPITALIZATION Description Pt to verbalize 2 lifestyle modifiers related to controlling HTN along with medication compliance  Outcome: Progressing  Continue to educate

## 2018-10-13 NOTE — Progress Notes (Signed)
[place] PHYSICAL MEDICINE & REHABILITATION PROGRESS NOTE   Subjective/Complaints: Patient seen laying in bed this morning.  She states she slept well overnight.  Her back was removed yesterday.  Discussed with PA and therapies regarding medical stability and functional status.  She was seen by Ortho yesterday as well, notes reviewed.  Please see PA note as well.  Review of systems: Denies CP, SOB, N/V/D.   Objective:   No results found. Recent Labs    10/12/18 0446 10/12/18 0653 10/13/18 0741  WBC 7.8  --  8.4  HGB 6.6* 7.4* 9.9*  HCT 20.6* 22.7* 30.3*  PLT 333  --  389   Recent Labs    10/12/18 0446  NA 137  K 3.9  CL 102  CO2 24  GLUCOSE 114*  BUN 30*  CREATININE 1.56*  CALCIUM 8.8*    Intake/Output Summary (Last 24 hours) at 10/13/2018 1236 Last data filed at 10/13/2018 1046 Gross per 24 hour  Intake 685 ml  Output -  Net 685 ml     Physical Exam: Vital Signs Blood pressure (!) 142/80, pulse (!) 105, temperature 98.2 F (36.8 C), temperature source Oral, resp. rate 18, height 5\' 8"  (1.727 m), weight (!) 143.2 kg, SpO2 98 %. Constitutional: No distress . Vital signs reviewed. Obese HENT: Normocephalic.  Atraumatic. Eyes: EOMI.  No discharge. Cardiovascular: No JVD. Respiratory: Normal effort. GI: Non-distended. Musc: RLE with edema and tenderness, stable Neurological: She is alertand oriented Motor: B/l UE 5/5.  RLE: Hip flexion, knee extension 4-/5, ankle dorsiflexion 3+/5, some pain inhibition, stable.  LLE: 4+/5 proximal to distal, stable Skin: RLE with dressing C/D/I. Psychiatric: She has anormal mood and affect. Herbehavior is normal.Judgmentand thought contentnormal.   Assessment/Plan: 1. Functional deficits secondary to debility peripheral vascular disease which require 3+ hours per day of interdisciplinary therapy in a comprehensive inpatient rehab setting.  Physiatrist is providing close team supervision and 24 hour management of  active medical problems listed below.  Physiatrist and rehab team continue to assess barriers to discharge/monitor patient progress toward functional and medical goals  Care Tool:  Bathing    Body parts bathed by patient: Right arm, Left arm, Chest, Abdomen, Front perineal area, Buttocks, Right upper leg, Left upper leg, Right lower leg, Left lower leg, Face         Bathing assist Assist Level: Set up assist     Upper Body Dressing/Undressing Upper body dressing   What is the patient wearing?: Bra, Pull over shirt    Upper body assist Assist Level: Independent    Lower Body Dressing/Undressing Lower body dressing      What is the patient wearing?: Pants     Lower body assist Assist for lower body dressing: Independent     Toileting Toileting    Toileting assist Assist for toileting: Independent with assistive device Assistive Device Comment: Front wheel walker   Transfers Chair/bed transfer  Transfers assist     Chair/bed transfer assist level: Supervision/Verbal cueing     Locomotion Ambulation   Ambulation assist      Assist level: Supervision/Verbal cueing Assistive device: Walker-rolling Max distance: 150'   Walk 10 feet activity   Assist     Assist level: Supervision/Verbal cueing Assistive device: Walker-rolling   Walk 50 feet activity   Assist Walk 50 feet with 2 turns activity did not occur: Safety/medical concerns  Assist level: Supervision/Verbal cueing Assistive device: Walker-rolling    Walk 150 feet activity   Assist Walk 150 feet  activity did not occur: Safety/medical concerns  Assist level: Supervision/Verbal cueing Assistive device: Walker-rolling    Walk 10 feet on uneven surface  activity   Assist Walk 10 feet on uneven surfaces activity did not occur: Safety/medical concerns         Wheelchair     Assist   Type of Wheelchair: Manual    Wheelchair assist level: Supervision/Verbal cueing Max  wheelchair distance: 262ft    Wheelchair 50 feet with 2 turns activity    Assist        Assist Level: Supervision/Verbal cueing   Wheelchair 150 feet activity     Assist     Assist Level: Supervision/Verbal cueing    Medical Problem List and Plan: 1.Functional deficitssecondary to debility related to right leg wound with infection and associated complications  Cont CIR  Patient was scheduled for discharge today, however due to missing time secondary to blood transfusions and pain, as well as recent removal of wound VAC, will monitor today and plan for discharge tomorrow.  Discussed with therapies and PA.  Will see patient for transitional care management in 1-2 weeks post-discharge 2.H/o DVT/Antithrombotics: -DVT/anticoagulation:Pharmaceutical:Coumadin restarted per Ortho   INR subtherapeutic on 1/30 -antiplatelet therapy: N/A 3. Pain Management:Oxycodone prn.  Relatively controlled on 5/30 4. Mood:LCSW to follow for evaluation and support. -antipsychotic agents: N/A  5. Neuropsych: This patientiscapable of making decisions on herown behalf. 6. Skin/Wound Care:Wound VAC DC'd per Ortho on 5/30. 7. Fluids/Electrolytes/Nutrition:Monitor I/Osand encourage appropriate PO intake. 8. HTN: Monitor BP bid. Continue HCTZ and Norvasc.  Vitals:   10/13/18 0521 10/13/18 1003  BP: 139/81 (!) 142/80  Pulse: (!) 105   Resp: 18   Temp: 98.2 F (36.8 C)   SpO2: 98%    Relatively controlled on 5/30  Encourage fluid intake 9. Morbid obesity: BMI 53. Educate on appropriate diet and weight loss to help promote mobility.  10. OSA: Noncompliant with CPAP 11. H/O depression: Continue Wellbutrin. 12. ABLA  Hb 9.9 on 5/30  Status post transfusion 13.  AKI on CKD, stage II  Cr 1.56 on 5/29  Will give 1 L IV fluid bolus, echo from 2019 reviewed-50-55% EF  Cont to monitor 14. Hypoalbuminemia  Supplement initiated on 5/25  LOS: 8 days A  FACE TO FACE EVALUATION WAS PERFORMED  Willodean Leven [name] 10/13/2018, 12:36 PM

## 2018-10-13 NOTE — Progress Notes (Signed)
Pt A/O no noted distress. She refused bowel regimen, but this morning she stated, "I'm constipated." Writer informed pt she refused all bowel regimen. Pt was administered the suppository and given warm apple juice. Administered prn pain regimen. Staff will continue to monitor and meet needs.

## 2018-10-14 LAB — PROTIME-INR
INR: 1.9 — ABNORMAL HIGH (ref 0.8–1.2)
Prothrombin Time: 21.5 seconds — ABNORMAL HIGH (ref 11.4–15.2)

## 2018-10-14 MED ORDER — WARFARIN SODIUM 7.5 MG PO TABS: 7.5000 mg | ORAL_TABLET | Freq: Once | ORAL | Status: DC

## 2018-10-14 NOTE — Progress Notes (Signed)
[facility] for warfarin Indication: h/o DVT  Allergies  Allergen Reactions  . Vicodin [Hydrocodone-Acetaminophen] Nausea And Vomiting   Patient Measurements: Height: 5\' 8"  (172.7 cm) Weight: (!) 315 lb 11.2 oz (143.2 kg) IBW/kg (Calculated) : 63.9  Vital Signs: Temp: 98.4 F (36.9 C) (05/31 0345) Temp Source: Oral (05/31 0345) BP: 120/60 (05/31 0818) Pulse Rate: 51 (05/31 0345)  Labs: Recent Labs    10/11/18 1547 10/12/18 0446 10/12/18 0653 10/13/18 0741 10/14/18 0521  HGB 7.6* 6.6* 7.4* 9.9*  --   HCT 23.4* 20.6* 22.7* 30.3*  --   PLT 383 333  --  389  --   LABPROT  --  15.5*  --  17.0* 21.5*  INR  --  1.3*  --  1.4* 1.9*  CREATININE  --  1.56*  --   --   --    Estimated Creatinine Clearance: 68 mL/min (A) (by C-G formula based on SCr of 1.56 mg/dL (H)).  Medical History: Past Medical History:  Diagnosis Date  . Anemia   . Anxiety   . Arthritis   . Chronic bronchitis ([place])   . Chronic upper back pain   . Depression   . DVT (deep venous thrombosis) (HCC)    BLE  . GERD (gastroesophageal reflux disease)   . Headache    "weekly" (04/16/2015)  . Hypertension   . Migraine    "monthly" (04/16/2015)  . Morbid obesity with BMI of 50.0-59.9, adult ([place])   . Obesity   . Pneumonia 2014  . Pulmonary embolism ([place])   . Sinusitis nasal   . Sleep apnea    "I'm suppose to wear a mask but I don't" (04/16/2015)  . Varicose veins of right lower extremity   . Venous stasis of lower extremity    right  . Wears dentures   . Wears glasses    Assessment: 48 yo female with h/o DVT on warfarin prior to acute admit. Warfarin placed on hold due to bleeding from wound. OK obtained from Ortho to resume warfarin.  PTA dose was 7.5mg  daily except 10 mg on Wednesdays and Sundays. INR on acute admission was 2.7. Restart at home dose (of note patient to receive fluconazole x 1 today which may affect INR to some degree).   Goal of Therapy:  INR  2-3 Monitor platelets by anticoagulation protocol: Yes   Today, 10/14/2018 INR 1.9 today, after 2 x 7.5mg , 3 x 15mg  Warfarin Po intake 100% trays Transfused 5/25, 5/ 26 & 5.29, Hgb to 9.9 (5/30) Wound VAC   Plan:  PT/INR daily Warfarin 7.5mg  PO x 1 today CBC 6/1 Monitor for further bleeding  Minda Ditto PharmD [phone] Clinical Pharmacist Please utilize Amion for appropriate phone number to reach the unit pharmacist ([place]) 10/14/2018,11:50 AM

## 2018-10-14 NOTE — Progress Notes (Signed)
Patient discharged to home, accompanied by her sister. 

## 2018-10-14 NOTE — Progress Notes (Signed)
Patient medically stable for discharge today.  Discussed with nursing.  Please see DC discharge summary as well.  Greater than 30 minutes spent in total for discharge in counseling and coordination of care regarding anemia, pain, function, drainage, etc.

## 2018-10-15 ENCOUNTER — Encounter (HOSPITAL_COMMUNITY): Payer: Self-pay | Admitting: Orthopedic Surgery

## 2018-10-15 ENCOUNTER — Telehealth: Payer: Self-pay | Admitting: *Deleted

## 2018-10-15 DIAGNOSIS — E559 Vitamin D deficiency, unspecified: Secondary | ICD-10-CM | POA: Diagnosis not present

## 2018-10-15 DIAGNOSIS — R1013 Epigastric pain: Secondary | ICD-10-CM | POA: Diagnosis not present

## 2018-10-15 DIAGNOSIS — I10 Essential (primary) hypertension: Secondary | ICD-10-CM | POA: Diagnosis not present

## 2018-10-15 DIAGNOSIS — R062 Wheezing: Secondary | ICD-10-CM | POA: Diagnosis not present

## 2018-10-15 DIAGNOSIS — F329 Major depressive disorder, single episode, unspecified: Secondary | ICD-10-CM | POA: Diagnosis not present

## 2018-10-15 DIAGNOSIS — N189 Chronic kidney disease, unspecified: Secondary | ICD-10-CM | POA: Diagnosis not present

## 2018-10-15 DIAGNOSIS — Z86711 Personal history of pulmonary embolism: Secondary | ICD-10-CM | POA: Diagnosis not present

## 2018-10-15 DIAGNOSIS — G4733 Obstructive sleep apnea (adult) (pediatric): Secondary | ICD-10-CM | POA: Diagnosis not present

## 2018-10-15 DIAGNOSIS — I878 Other specified disorders of veins: Secondary | ICD-10-CM | POA: Diagnosis not present

## 2018-10-15 DIAGNOSIS — Z86718 Personal history of other venous thrombosis and embolism: Secondary | ICD-10-CM | POA: Diagnosis not present

## 2018-10-15 DIAGNOSIS — Z4800 Encounter for change or removal of nonsurgical wound dressing: Secondary | ICD-10-CM | POA: Diagnosis not present

## 2018-10-15 DIAGNOSIS — L97211 Non-pressure chronic ulcer of right calf limited to breakdown of skin: Secondary | ICD-10-CM | POA: Diagnosis not present

## 2018-10-15 DIAGNOSIS — J42 Unspecified chronic bronchitis: Secondary | ICD-10-CM | POA: Diagnosis not present

## 2018-10-15 DIAGNOSIS — Z7901 Long term (current) use of anticoagulants: Secondary | ICD-10-CM | POA: Diagnosis not present

## 2018-10-15 DIAGNOSIS — D509 Iron deficiency anemia, unspecified: Secondary | ICD-10-CM | POA: Diagnosis not present

## 2018-10-15 DIAGNOSIS — I129 Hypertensive chronic kidney disease with stage 1 through stage 4 chronic kidney disease, or unspecified chronic kidney disease: Secondary | ICD-10-CM | POA: Diagnosis not present

## 2018-10-15 DIAGNOSIS — R7303 Prediabetes: Secondary | ICD-10-CM | POA: Diagnosis not present

## 2018-10-15 DIAGNOSIS — G629 Polyneuropathy, unspecified: Secondary | ICD-10-CM | POA: Diagnosis not present

## 2018-10-15 NOTE — Telephone Encounter (Signed)
Sharyn Lull RN [facility] called for POC 1wk8.  Approval given.

## 2018-10-16 ENCOUNTER — Encounter: Payer: Self-pay | Admitting: Orthopedic Surgery

## 2018-10-16 ENCOUNTER — Other Ambulatory Visit: Payer: Self-pay

## 2018-10-16 ENCOUNTER — Telehealth: Payer: Self-pay | Admitting: *Deleted

## 2018-10-16 ENCOUNTER — Ambulatory Visit (INDEPENDENT_AMBULATORY_CARE_PROVIDER_SITE_OTHER): Payer: Medicare Other | Admitting: Orthopedic Surgery

## 2018-10-16 ENCOUNTER — Telehealth: Payer: Self-pay

## 2018-10-16 VITALS — Ht 68.0 in | Wt 315.0 lb

## 2018-10-16 DIAGNOSIS — J42 Unspecified chronic bronchitis: Secondary | ICD-10-CM | POA: Diagnosis not present

## 2018-10-16 DIAGNOSIS — L97211 Non-pressure chronic ulcer of right calf limited to breakdown of skin: Secondary | ICD-10-CM | POA: Diagnosis not present

## 2018-10-16 DIAGNOSIS — N189 Chronic kidney disease, unspecified: Secondary | ICD-10-CM | POA: Diagnosis not present

## 2018-10-16 DIAGNOSIS — L97912 Non-pressure chronic ulcer of unspecified part of right lower leg with fat layer exposed: Secondary | ICD-10-CM

## 2018-10-16 DIAGNOSIS — I878 Other specified disorders of veins: Secondary | ICD-10-CM | POA: Diagnosis not present

## 2018-10-16 DIAGNOSIS — I129 Hypertensive chronic kidney disease with stage 1 through stage 4 chronic kidney disease, or unspecified chronic kidney disease: Secondary | ICD-10-CM | POA: Diagnosis not present

## 2018-10-16 DIAGNOSIS — D509 Iron deficiency anemia, unspecified: Secondary | ICD-10-CM | POA: Diagnosis not present

## 2018-10-16 NOTE — Telephone Encounter (Signed)
[facility] called to get VO for POC 1wk4, 1qo wk4.  Approval given.

## 2018-10-16 NOTE — Telephone Encounter (Signed)
Transitional Care call-patient    1. Are you/is patient experiencing any problems since coming home? No Are there any questions regarding any aspect of care? No 2. Are there any questions regarding medications administration/dosing? No Are meds being taken as prescribed? Yes Patient should review meds with caller to confirm 3. Have there been any falls? No 4. Has Home Health been to the house and/or have they contacted you? Yes If not, have you tried to contact them? Can we help you contact them? 5. Are bowels and bladder emptying properly? Yes  Are there any unexpected incontinence issues? No If applicable, is patient following bowel/bladder programs? 6. Any fevers, problems with breathing, unexpected pain? No 7. Are there any skin problems or new areas of breakdown? No 8. Has the patient/family member arranged specialty MD follow up (ie cardiology/neurology/renal/surgical/etc)? Yes  Can we help arrange? 9. Does the patient need any other services or support that we can help arrange? No 10. Are caregivers following through as expected in assisting the patient? Yes 11. Has the patient quit smoking, drinking alcohol, or using drugs as recommended? Yes  Appointment time 9:20 am arrive time 9:00 with Danella Sensing [facility] [place]

## 2018-10-16 NOTE — Progress Notes (Signed)
Post-Op Visit Note   Patient: Alice [name]           Date of Birth: 24-Dec-1970           MRN: [id number] Visit Date: 10/16/2018 PCP: Benito Mccreedy, MD  Chief Complaint:  Chief Complaint  Patient presents with  . Right Leg - Routine Post Op    09/28/18, 10/03/18 I&D RLE [facility]    HPI:  HPI  The patient is a 48 year old woman who presents today status post irrigation and debridement of her right lower extremity venous ulcer on May 15 as well as May 28.  With split thickness skin grafting.  [facility] following for nursing and physical therapy needs.  Since discharge from inpatient rehab has been doing dry dressings with Ace wrap.  Ortho Exam On examination of RLE ulcerative area with clean margins. Granulating tissue present in full wound bed. No drainage. No maceration. No odor. Moderate swelling to lower extremity.   Visit Diagnoses:  1. Chronic ulcer of right leg, with fat layer exposed ([place])     Plan: Vaseline gauze in place 4 x 4's and a Dynaflex wrap applied over this.  She will follow-up in the office in 1 week for dressing change.  Was provided with supplies for home change should she soak through with that Dynaflex wrap.  Follow-Up Instructions: Return in about 1 week (around 10/23/2018).   Imaging: No results found.  Orders:  No orders of the defined types were placed in this encounter.  No orders of the defined types were placed in this encounter.    PMFS History: Patient Active Problem List   Diagnosis Date Noted  . Subtherapeutic international normalized ratio (INR)   . Postoperative pain   . Hypoalbuminemia   . Acute on chronic kidney failure ([place])   . Acute blood loss anemia   . Benign essential HTN   . Debility 10/05/2018  . Chronic ulcer of right leg, with fat layer exposed ([place]) 09/28/2018  . Chronic ulcer of leg, right, with necrosis of muscle ([place])   . Iron deficiency anemia 01/29/2018  . Pulmonary embolus ([place]) 01/09/2018  .  Encounter for therapeutic drug monitoring 01/09/2018  . Severe malnutrition ([place]) 08/24/2017  . Acute kidney injury superimposed on CKD ([place]) 12/28/2016  . Hx of skin graft 08/01/2016  . 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 ([place]) 07/25/2016  . 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 ([place]) 07/05/2016  . Trichomonal infection 11/24/2015  . Abdominal pain 05/02/2015  . Symptomatic cholelithiasis 04/16/2015  . Acute bilateral upper abdominal pain 04/16/2015  . Microcytic anemia 05/18/2013  . Hypotension, unspecified 05/17/2013  . CKD (chronic kidney disease), stage III ([place]) 05/17/2013  . Hypokalemia 05/17/2013  . Anal fissure 02/06/2013  . Anal skin tag 02/06/2013  . ALLERGIC RHINITIS 03/05/2010  . LOW BACK PAIN SYNDROME 12/13/2007  . Local infection of skin and subcutaneous tissue 10/10/2007  . OBESITY, MORBID 12/02/2006  . HTN (hypertension) 12/02/2006  . History of pulmonary embolism 12/02/2006  . History of DVT (deep vein thrombosis) 12/02/2006  . 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 GASTROESOPHAGEAL REFLUX DISEASE 12/02/2006  . OSA (obstructive sleep apnea) 12/02/2006   Past Medical History:  Diagnosis Date  . Anemia   . Anxiety   . Arthritis   . Chronic bronchitis ([place])   . Chronic upper back pain   . Depression   . DVT (deep venous thrombosis) ([place])  BLE  . GERD (gastroesophageal reflux disease)   . Headache    "weekly" (04/16/2015)  . Hypertension   . Migraine    "monthly" (04/16/2015)  . Morbid obesity with BMI of 50.0-59.9, adult ([place])   . Obesity   . Pneumonia 2014  . Pulmonary embolism ([place])   . Sinusitis nasal   . Sleep apnea    "I'm suppose to wear a mask but I don't" (04/16/2015)  . Varicose veins of right lower extremity   . Venous stasis of lower extremity    right  . Wears dentures   . Wears glasses     Family History  Problem Relation Age of Onset  .  Kidney disease Mother        kidney transplant    Past Surgical History:  Procedure Laterality Date  . CHOLECYSTECTOMY N/A 04/18/2015   Procedure: LAPAROSCOPIC CHOLECYSTECTOMY;  Surgeon: Ralene Ok, MD;  Location: [facility];  Service: General;  Laterality: N/A;  . HYSTEROSCOPY W/D&C  12/24/2001   Archie Endo 09/28/2010  . I&D EXTREMITY Right 07/25/2016   Procedure: IRRIGATION AND DEBRIDEMENT RIGHT LEG ULCER, APPLY VERAFLO VAC;  Surgeon: [name], MD;  Location: [facility];  Service: Orthopedics;  Laterality: Right;  . I&D EXTREMITY Right 09/28/2018   Procedure: DEBRIDEMENT RIGHT LOWER LEG WITH PLACEMENT WITH PLACEMENT OF SKIN GRAFT AND VAC;  Surgeon: [name], MD;  Location: [facility];  Service: Orthopedics;  Laterality: Right;  . I&D EXTREMITY Right 10/03/2018   Procedure: REPEAT IRRIGATION AND DEBRIDEMENT RIGHT LOWER LEG, VAC PLACEMENT;  Surgeon: [name], MD;  Location: [facility];  Service: Orthopedics;  Laterality: Right;  . INCISE AND DRAIN ABCESS Right 07/14/2016  . INCISION AND DRAINAGE Right 09/10/2008   leg:  skin and soft tissue and muscle/notes 09/15/2010  . INCISION AND DRAINAGE Right 01/01/2008   Chronic venous stasis insufficiency ulcer,/notes 09/14/2010/  . INCISION AND DRAINAGE Right 08/1935   calf w/application wound vac/notes 07/20/2010  . INCISION AND DRAINAGE Right 07/25/2016   IRRIGATION AND DEBRIDEMENT RIGHT LEG ULCER,  . LAPAROSCOPIC GASTRIC BYPASS  ~ 2007  . MULTIPLE TOOTH EXTRACTIONS    . SKIN GRAFT SPLIT THICKNESS LEG / FOOT Right 07/25/2016   LEG  . SKIN SPLIT GRAFT Right 07/27/2016   Procedure: SKIN GRAFT RIGHT LEG WITH THERASKIN APPLICATION;  Surgeon: [name], MD;  Location: [facility];  Service: Orthopedics;  Laterality: Right;  . TONSILLECTOMY     Social History   Occupational History  . Not on file  Tobacco Use  . Smoking status: Never Smoker  . Smokeless tobacco: Never Used  Substance and Sexual Activity  . Alcohol use: No  . Drug use: No  . Sexual  activity: Yes    Partners: Male    Birth control/protection: I.U.D.

## 2018-10-18 ENCOUNTER — Telehealth: Payer: Self-pay

## 2018-10-18 NOTE — Telephone Encounter (Signed)

## 2018-10-23 ENCOUNTER — Ambulatory Visit (INDEPENDENT_AMBULATORY_CARE_PROVIDER_SITE_OTHER): Payer: Medicare Other | Admitting: Orthopedic Surgery

## 2018-10-23 ENCOUNTER — Encounter: Payer: Self-pay | Admitting: Orthopedic Surgery

## 2018-10-23 ENCOUNTER — Other Ambulatory Visit: Payer: Self-pay

## 2018-10-23 VITALS — Ht 68.0 in | Wt 315.0 lb

## 2018-10-23 DIAGNOSIS — L97919 Non-pressure chronic ulcer of unspecified part of right lower leg with unspecified severity: Secondary | ICD-10-CM

## 2018-10-23 DIAGNOSIS — Z945 Skin transplant status: Secondary | ICD-10-CM

## 2018-10-23 DIAGNOSIS — I87331 Chronic venous hypertension (idiopathic) with ulcer and inflammation of right lower extremity: Secondary | ICD-10-CM

## 2018-10-23 MED ORDER — OXYCODONE HCL 10 MG PO TABS: 10.0000 mg | ORAL_TABLET | Freq: Four times a day (QID) | ORAL | 0 refills | Status: DC | PRN

## 2018-10-23 NOTE — Progress Notes (Signed)
Post-Op Visit Note   Patient: Alice [name]           Date of Birth: Jan 22, 1971           MRN: [id number] Visit Date: 10/23/2018 PCP: Benito Mccreedy, MD  Chief Complaint:  Chief Complaint  Patient presents with  . Right Leg - Routine Post Op    09/28/18, 10/03/18 I&D RLE [facility]    HPI:  HPI The patient is a 48 year old woman seen today status post irrigation and debridement with skin grafting right leg on 5/15 and 10/03/18. Has been in dynaflex wrap with vaseline gauze over the wound for 1 week.  Ortho Exam On exam has trace edema to RLE. Moderate edema to foot distal to wrap. Weeping serous fluid from wound. There is granulation in wound bed, 70%. Very thin fibrinous tissue, patient declines debridement. No bleeding. No surrounding maceration or erythema. No odor.   Visit Diagnoses:  1. 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 Hx of skin graft     Plan: will reapply dynaflex wrap for compression with vaseline gauze over graft. To follow up in office in 1 week. She will cleanse the wound and reapply the same wrap and dressing once in the interim.   Follow-Up Instructions: Return in about 1 week (around 10/30/2018).   Imaging: No results found.  Orders:  No orders of the defined types were placed in this encounter.  No orders of the defined types were placed in this encounter.    PMFS History: Patient Active Problem List   Diagnosis Date Noted  . Subtherapeutic international normalized ratio (INR)   . Postoperative pain   . Hypoalbuminemia   . Acute on chronic kidney failure ([place])   . Acute blood loss anemia   . Benign essential HTN   . Debility 10/05/2018  . Chronic ulcer of right leg, with fat layer exposed ([place]) 09/28/2018  . Chronic ulcer of leg, right, with necrosis of muscle ([place])   . Iron deficiency anemia 01/29/2018  . Pulmonary embolus ([place]) 01/09/2018  . Encounter for therapeutic drug  monitoring 01/09/2018  . Severe malnutrition ([place]) 08/24/2017  . Acute kidney injury superimposed on CKD ([place]) 12/28/2016  . Hx of skin graft 08/01/2016  . 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 ([place]) 07/25/2016  . 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 ([place]) 07/05/2016  . Trichomonal infection 11/24/2015  . Abdominal pain 05/02/2015  . Symptomatic cholelithiasis 04/16/2015  . Acute bilateral upper abdominal pain 04/16/2015  . Microcytic anemia 05/18/2013  . Hypotension, unspecified 05/17/2013  . CKD (chronic kidney disease), stage III ([place]) 05/17/2013  . Hypokalemia 05/17/2013  . Anal fissure 02/06/2013  . Anal skin tag 02/06/2013  . ALLERGIC RHINITIS 03/05/2010  . LOW BACK PAIN SYNDROME 12/13/2007  . Local infection of skin and subcutaneous tissue 10/10/2007  . OBESITY, MORBID 12/02/2006  . HTN (hypertension) 12/02/2006  . History of pulmonary embolism 12/02/2006  . History of DVT (deep vein thrombosis) 12/02/2006  . 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 GASTROESOPHAGEAL REFLUX DISEASE 12/02/2006  . OSA (obstructive sleep apnea) 12/02/2006   Past Medical History:  Diagnosis Date  . Anemia   . Anxiety   . Arthritis   . Chronic bronchitis ([place])   . Chronic upper back pain   . Depression   . DVT (deep venous thrombosis) (HCC)    BLE  .  GERD (gastroesophageal reflux disease)   . Headache    "weekly" (04/16/2015)  . Hypertension   . Migraine    "monthly" (04/16/2015)  . Morbid obesity with BMI of 50.0-59.9, adult ([place])   . Obesity   . Pneumonia 2014  . Pulmonary embolism ([place])   . Sinusitis nasal   . Sleep apnea    "I'm suppose to wear a mask but I don't" (04/16/2015)  . Varicose veins of right lower extremity   . Venous stasis of lower extremity    right  . Wears dentures   . Wears glasses     Family History  Problem Relation Age of Onset  . Kidney disease Mother        kidney  transplant    Past Surgical History:  Procedure Laterality Date  . CHOLECYSTECTOMY N/A 04/18/2015   Procedure: LAPAROSCOPIC CHOLECYSTECTOMY;  Surgeon: Ralene Ok, MD;  Location: [facility];  Service: General;  Laterality: N/A;  . HYSTEROSCOPY W/D&C  12/24/2001   Archie Endo 09/28/2010  . I&D EXTREMITY Right 07/25/2016   Procedure: IRRIGATION AND DEBRIDEMENT RIGHT LEG ULCER, APPLY VERAFLO VAC;  Surgeon: [name], MD;  Location: [facility];  Service: Orthopedics;  Laterality: Right;  . I&D EXTREMITY Right 09/28/2018   Procedure: DEBRIDEMENT RIGHT LOWER LEG WITH PLACEMENT WITH PLACEMENT OF SKIN GRAFT AND VAC;  Surgeon: [name], MD;  Location: [facility];  Service: Orthopedics;  Laterality: Right;  . I&D EXTREMITY Right 10/03/2018   Procedure: REPEAT IRRIGATION AND DEBRIDEMENT RIGHT LOWER LEG, VAC PLACEMENT;  Surgeon: [name], MD;  Location: [facility];  Service: Orthopedics;  Laterality: Right;  . INCISE AND DRAIN ABCESS Right 07/14/2016  . INCISION AND DRAINAGE Right 09/10/2008   leg:  skin and soft tissue and muscle/notes 09/15/2010  . INCISION AND DRAINAGE Right 01/01/2008   Chronic venous stasis insufficiency ulcer,/notes 09/14/2010/  . INCISION AND DRAINAGE Right 08/1658   calf w/application wound vac/notes 07/20/2010  . INCISION AND DRAINAGE Right 07/25/2016   IRRIGATION AND DEBRIDEMENT RIGHT LEG ULCER,  . LAPAROSCOPIC GASTRIC BYPASS  ~ 2007  . MULTIPLE TOOTH EXTRACTIONS    . SKIN GRAFT SPLIT THICKNESS LEG / FOOT Right 07/25/2016   LEG  . SKIN SPLIT GRAFT Right 07/27/2016   Procedure: SKIN GRAFT RIGHT LEG WITH THERASKIN APPLICATION;  Surgeon: [name], MD;  Location: [facility];  Service: Orthopedics;  Laterality: Right;  . TONSILLECTOMY     Social History   Occupational History  . Not on file  Tobacco Use  . Smoking status: Never Smoker  . Smokeless tobacco: Never Used  Substance and Sexual Activity  . Alcohol use: No  . Drug use: No  . Sexual activity: Yes    Partners: Male    Birth  control/protection: I.U.D.

## 2018-10-23 NOTE — Addendum Note (Signed)
Addended by: Dondra Prader R on: 10/23/2018 03:19 PM   Modules accepted: Orders

## 2018-10-24 ENCOUNTER — Telehealth: Payer: Self-pay | Admitting: Orthopedic Surgery

## 2018-10-24 NOTE — Telephone Encounter (Signed)
Alice [name] from [facility]  Needed orders for wound dressing and HH orders. Patient said she couldn't remember instructions.  [phone]  Please [phone] fax#

## 2018-10-25 ENCOUNTER — Encounter: Payer: Medicare Other | Attending: Registered Nurse | Admitting: Registered Nurse

## 2018-10-25 ENCOUNTER — Ambulatory Visit (INDEPENDENT_AMBULATORY_CARE_PROVIDER_SITE_OTHER): Payer: Medicare Other | Admitting: *Deleted

## 2018-10-25 ENCOUNTER — Other Ambulatory Visit: Payer: Self-pay

## 2018-10-25 ENCOUNTER — Encounter: Payer: Self-pay | Admitting: Registered Nurse

## 2018-10-25 VITALS — BP 118/78 | HR 94 | Temp 97.7°F | Ht 66.0 in | Wt 300.0 lb

## 2018-10-25 DIAGNOSIS — I2699 Other pulmonary embolism without acute cor pulmonale: Secondary | ICD-10-CM

## 2018-10-25 DIAGNOSIS — D62 Acute posthemorrhagic anemia: Secondary | ICD-10-CM | POA: Diagnosis not present

## 2018-10-25 DIAGNOSIS — Z5181 Encounter for therapeutic drug level monitoring: Secondary | ICD-10-CM

## 2018-10-25 DIAGNOSIS — G8918 Other acute postprocedural pain: Secondary | ICD-10-CM

## 2018-10-25 DIAGNOSIS — R5381 Other malaise: Secondary | ICD-10-CM | POA: Diagnosis not present

## 2018-10-25 DIAGNOSIS — L97913 Non-pressure chronic ulcer of unspecified part of right lower leg with necrosis of muscle: Secondary | ICD-10-CM | POA: Diagnosis not present

## 2018-10-25 DIAGNOSIS — I10 Essential (primary) hypertension: Secondary | ICD-10-CM

## 2018-10-25 DIAGNOSIS — Z86718 Personal history of other venous thrombosis and embolism: Secondary | ICD-10-CM | POA: Diagnosis not present

## 2018-10-25 DIAGNOSIS — D508 Other iron deficiency anemias: Secondary | ICD-10-CM | POA: Diagnosis not present

## 2018-10-25 LAB — POCT INR: INR: 3.8 — AB (ref 2.0–3.0)

## 2018-10-25 MED ORDER — APIXABAN 2.5 MG PO TABS: 2.5000 mg | ORAL_TABLET | Freq: Two times a day (BID) | ORAL | 1 refills | Status: DC

## 2018-10-25 NOTE — Progress Notes (Signed)
Last office note, home visit entered in error, thanks Carter

## 2018-10-25 NOTE — Patient Instructions (Signed)
Description   Stop taking warfarin. Start taking Eliquis 2.5mg  - 1 tablet twice a day. First dose will be Saturday morning.

## 2018-10-25 NOTE — Progress Notes (Signed)
Subjective:    Patient ID: Alice [name], female    DOB: October 21, 1970, 48 y.o.   MRN: [id number]  HPI: Alice [name] is a 48 y.o. female who is here for transitional care visit in follow up of her debility, hypertension, acute blood loss anemia, iron deficiency anemia and post-operative pain.  She was admitted to [facility] on 09/28/2018 with increasing drainage from her right lower extremity ulcer. She underwent right lower extremity debridement with placement of skin graft and wound vac by Dr. Sharol Given.   She was admitted to Inpatient rehabilitation on 10/05/2018 and and discharged home on 10/14/2018. She is receiving outpatient therapy with [facility]. She states her pain is located in her right lower extremity. She rates her pain 10. Her current exercise regime is walking with walker.   Discussed with Ms. Owens Shark regarding her depression she states she doesn't want to harm herself,  she denies suicidal ideation or plan. She admits to being Depressed with her health. Emotional support given.   Pain Inventory Average Pain 10 Pain Right Now 10 My pain is constant  In the last 24 hours, has pain interfered with the following? General activity 7 Relation with others 7 Enjoyment of life 7 What TIME of day is your pain at its worst? all Sleep (in general) NA  Pain is worse with: walking and standing Pain improves with: rest, heat/ice and medication Relief from Meds: not answered  Mobility use a walker do you drive?  no  Function I need assistance with the following:  bathing, meal prep, household duties and shopping  Neuro/Psych weakness numbness tingling trouble walking depression loss of taste or smell  Prior Studies Any changes since last visit?  no  Physicians involved in your care Any changes since last visit?  no Orthopedist Dr Sharol Given   Family History  Problem Relation Age of Onset  . Kidney disease Mother        kidney transplant   Social History    Socioeconomic History  . Marital status: Single    Spouse name: Not on file  . Number of children: Not on file  . Years of education: Not on file  . Highest education level: Not on file  Occupational History  . Not on file  Social Needs  . Financial resource strain: Not on file  . Food insecurity    Worry: Not on file    Inability: Not on file  . Transportation needs    Medical: Not on file    Non-medical: Not on file  Tobacco Use  . Smoking status: Never Smoker  . Smokeless tobacco: Never Used  Substance and Sexual Activity  . Alcohol use: No  . Drug use: No  . Sexual activity: Yes    Partners: Male    Birth control/protection: I.U.D.  Lifestyle  . Physical activity    Days per week: Not on file    Minutes per session: Not on file  . Stress: Not on file  Relationships  . Social Herbalist on phone: Not on file    Gets together: Not on file    Attends religious service: Not on file    Active member of club or organization: Not on file    Attends meetings of clubs or organizations: Not on file    Relationship status: Not on file  Other Topics Concern  . Not on file  Social History Narrative  . Not on file   Past Surgical  History:  Procedure Laterality Date  . CHOLECYSTECTOMY N/A 04/18/2015   Procedure: LAPAROSCOPIC CHOLECYSTECTOMY;  Surgeon: Ralene Ok, MD;  Location: [place];  Service: General;  Laterality: N/A;  . HYSTEROSCOPY W/D&C  12/24/2001   Archie Endo 09/28/2010  . I&D EXTREMITY Right 07/25/2016   Procedure: IRRIGATION AND DEBRIDEMENT RIGHT LEG ULCER, APPLY VERAFLO VAC;  Surgeon: [name], MD;  Location: [place];  Service: Orthopedics;  Laterality: Right;  . I&D EXTREMITY Right 09/28/2018   Procedure: DEBRIDEMENT RIGHT LOWER LEG WITH PLACEMENT WITH PLACEMENT OF SKIN GRAFT AND VAC;  Surgeon: [name], MD;  Location: [place];  Service: Orthopedics;  Laterality: Right;  . I&D EXTREMITY Right 10/03/2018   Procedure: REPEAT IRRIGATION AND DEBRIDEMENT  RIGHT LOWER LEG, VAC PLACEMENT;  Surgeon: [name], MD;  Location: [place];  Service: Orthopedics;  Laterality: Right;  . INCISE AND DRAIN ABCESS Right 07/14/2016  . INCISION AND DRAINAGE Right 09/10/2008   leg:  skin and soft tissue and muscle/notes 09/15/2010  . INCISION AND DRAINAGE Right 01/01/2008   Chronic venous stasis insufficiency ulcer,/notes 09/14/2010/  . INCISION AND DRAINAGE Right 05/270   calf w/application wound vac/notes 07/20/2010  . INCISION AND DRAINAGE Right 07/25/2016   IRRIGATION AND DEBRIDEMENT RIGHT LEG ULCER,  . LAPAROSCOPIC GASTRIC BYPASS  ~ 2007  . MULTIPLE TOOTH EXTRACTIONS    . SKIN GRAFT SPLIT THICKNESS LEG / FOOT Right 07/25/2016   LEG  . SKIN SPLIT GRAFT Right 07/27/2016   Procedure: SKIN GRAFT RIGHT LEG WITH THERASKIN APPLICATION;  Surgeon: [name], MD;  Location: [place];  Service: Orthopedics;  Laterality: Right;  . TONSILLECTOMY     Past Medical History:  Diagnosis Date  . Anemia   . Anxiety   . Arthritis   . Chronic bronchitis ([place])   . Chronic upper back pain   . Depression   . DVT (deep venous thrombosis) (HCC)    BLE  . GERD (gastroesophageal reflux disease)   . Headache    "weekly" (04/16/2015)  . Hypertension   . Migraine    "monthly" (04/16/2015)  . Morbid obesity with BMI of 50.0-59.9, adult ([place])   . Obesity   . Pneumonia 2014  . Pulmonary embolism ([place])   . Sinusitis nasal   . Sleep apnea    "I'm suppose to wear a mask but I don't" (04/16/2015)  . Varicose veins of right lower extremity   . Venous stasis of lower extremity    right  . Wears dentures   . Wears glasses    BP 118/78   Pulse 94   Temp 97.7 F (36.5 C)   Ht 5\' 6"  (1.676 m)   Wt 300 lb (136.1 kg)   SpO2 98%   BMI 48.42 kg/m   Opioid Risk Score:   Fall Risk Score:  `1  Depression screen PHQ 2/9  Depression screen PHQ 2/9 10/25/2018  Decreased Interest 0  Down, Depressed, Hopeless 0  PHQ - 2 Score 0  Altered sleeping 1  Tired, decreased energy 1   Change in appetite 1  Feeling bad or failure about yourself  1  Trouble concentrating 1  Moving slowly or fidgety/restless 1  Suicidal thoughts 2  PHQ-9 Score 8  Difficult doing work/chores Not difficult at all  Some recent data might be hidden    Review of Systems  Constitutional: Positive for appetite change, diaphoresis and unexpected weight change.       Poor appetite-mentioned loss of taste or smell  HENT: Negative.   Eyes: Negative.   Respiratory: Positive for wheezing.   Cardiovascular: Negative.   Gastrointestinal: Negative.   Endocrine: Negative.   Genitourinary: Positive for difficulty urinating.  Musculoskeletal: Positive for gait problem.  Skin: Negative.   Allergic/Immunologic: Negative.   Neurological: Positive for weakness and numbness.       Tingling  Hematological: Bruises/bleeds easily.       Coumadin  Psychiatric/Behavioral: Positive for dysphoric mood.  All other systems reviewed and are negative.      Objective:   Physical Exam Vitals signs and nursing note reviewed.  Constitutional:      Appearance: Normal appearance.  Neck:     Musculoskeletal: Normal range of motion and neck supple.  Cardiovascular:     Rate and Rhythm: Normal rate and regular rhythm.     Pulses: Normal pulses.     Heart sounds: Normal heart sounds.  Pulmonary:     Effort: Pulmonary effort is normal.     Breath sounds: Normal breath sounds.  Musculoskeletal:     Comments: Normal Muscle Bulk and Muscle Testing Reveals:  Upper Extremities: Full ROM and Muscle Strength 5/5  Lower Extremities:Right: Decreased ROM and Muscle Strength 4/5  Right lower dressing intact Left: Full ROM and Muscle Strength 5/5 Arises from Table Slowly using walker for support Antalgic Gait   Skin:    General: Skin is warm and dry.  Neurological:     Mental Status: She is alert and oriented to person, place, and time.  Psychiatric:        Mood and Affect: Mood normal.        Behavior: Behavior  normal.           Assessment & Plan:  1. Debility: Continue Home Health Therapy. Continue to Monitor.  2. HTN: Continue current medication regimen. PCP Following.  3. Acute Blood Loss Anemia: S/P Blood Transfusion : HGB 9.9 on 10/13/2018. PCP Following.  4. Iron deficiency anemia: Continue current medication regimen. PCP Following.  5. Right Lower Extremity Ulcer: Dr. Sharol Given Following. Ms. Ola reports Dr. Sharol Given changing the dressing only. Dressing was changed on Tuesday 10/23/2018 6. Post-operative Pain: DR. Sharol Given prescribing.   F/U with Dr Posey Pronto in 4-6 weeks

## 2018-10-26 DIAGNOSIS — J42 Unspecified chronic bronchitis: Secondary | ICD-10-CM | POA: Diagnosis not present

## 2018-10-26 DIAGNOSIS — I878 Other specified disorders of veins: Secondary | ICD-10-CM | POA: Diagnosis not present

## 2018-10-26 DIAGNOSIS — D509 Iron deficiency anemia, unspecified: Secondary | ICD-10-CM | POA: Diagnosis not present

## 2018-10-26 DIAGNOSIS — L97211 Non-pressure chronic ulcer of right calf limited to breakdown of skin: Secondary | ICD-10-CM | POA: Diagnosis not present

## 2018-10-26 DIAGNOSIS — I129 Hypertensive chronic kidney disease with stage 1 through stage 4 chronic kidney disease, or unspecified chronic kidney disease: Secondary | ICD-10-CM | POA: Diagnosis not present

## 2018-10-26 DIAGNOSIS — N189 Chronic kidney disease, unspecified: Secondary | ICD-10-CM | POA: Diagnosis not present

## 2018-10-29 NOTE — Telephone Encounter (Signed)
Orders were faxed for RLE dynaflex wrap with Vaseline guaze over graft per Dr Sharol Given.

## 2018-10-30 ENCOUNTER — Ambulatory Visit (INDEPENDENT_AMBULATORY_CARE_PROVIDER_SITE_OTHER): Payer: Medicare Other | Admitting: Physician Assistant

## 2018-10-30 ENCOUNTER — Other Ambulatory Visit: Payer: Self-pay

## 2018-10-30 ENCOUNTER — Encounter: Payer: Self-pay | Admitting: Physician Assistant

## 2018-10-30 VITALS — Ht 66.0 in | Wt 300.0 lb

## 2018-10-30 DIAGNOSIS — Z945 Skin transplant status: Secondary | ICD-10-CM

## 2018-10-30 DIAGNOSIS — L97919 Non-pressure chronic ulcer of unspecified part of right lower leg with unspecified severity: Secondary | ICD-10-CM

## 2018-10-30 DIAGNOSIS — I87331 Chronic venous hypertension (idiopathic) with ulcer and inflammation of right lower extremity: Secondary | ICD-10-CM

## 2018-10-30 DIAGNOSIS — R200 Anesthesia of skin: Secondary | ICD-10-CM

## 2018-10-30 MED ORDER — OXYCODONE HCL 10 MG PO TABS: 10.0000 mg | ORAL_TABLET | Freq: Four times a day (QID) | ORAL | 0 refills | Status: DC | PRN

## 2018-10-30 NOTE — Progress Notes (Signed)
Office Visit Note   Patient: Alice [name]           Date of Birth: 1970/07/14           MRN: [id number] Visit Date: 10/30/2018              Requested by: Benito Mccreedy, MD [address] [place],  [place] [address] PCP: Benito Mccreedy, MD  Chief Complaint  Patient presents with  . Right Leg - Routine Post Op    09/28/2018 RLE [facility]      HPI: The patient is a 48 yo woman seen for post operative follow up following serial debridement of her right lower leg ulceration with placement of a split thickness skin graft 10/03/2018. She has been changing the dressing mid week between her appointments. We are using vaseline gauze dressings to the wound bed and Dynalfex compression wraps. She reports severe pain over the area with activities and dressing changes.   Assessment & Plan: Visit Diagnoses:  1. Hx of skin graft   2. 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  Plan staple removal next week.  Will refer to Dr. Ernestina Patches for nerve conduction studies of right wrist to rule out carpal tunnel syndrome.   Follow-Up Instructions: Return in about 1 week (around 11/06/2018).   Ortho Exam  Patient is alert, oriented, no adenopathy, well-dressed, normal affect, normal respiratory effort. The right lower leg ulcer- the wound bed has good pink granulation present. The graft is well incorporated. Staples in place and patient declined to have removed today. We will plan staple removal next week. No peri ulcer irritation or signs of cellulitis or infection. Mild edema.  The patient has numbness over the right 3rd and 4th fingers since surgery as well.  Non tender over the right wrist to palpation. No thenar atrophy.  Imaging: No results found.   Labs: Lab Results  Component Value Date   REPTSTATUS 10/06/2018 FINAL 10/05/2018   GRAMSTAIN  09/28/2018    FEW WBC PRESENT, PREDOMINANTLY PMN FEW GRAM POSITIVE COCCI IN PAIRS FEW GRAM POSITIVE RODS    CULT 20,000 COLONIES/mL YEAST (A) 10/05/2018   LABORGA PROTEUS MIRABILIS 09/28/2018   LABORGA STAPHYLOCOCCUS CAPRAE 09/28/2018   LABORGA SERRATIA MARCESCENS 09/28/2018     Lab Results  Component Value Date   ALBUMIN 2.5 (L) 10/08/2018   ALBUMIN 3.2 (L) 01/29/2018   ALBUMIN 2.2 (L) 01/03/2017    Body mass index is 48.42 kg/m.  Orders:  No orders of the defined types were placed in this encounter.  Meds ordered this encounter  Medications  . Oxycodone HCl 10 MG TABS    Sig: Take 1 tablet (10 mg total) by mouth every 6 (six) hours as needed.    Dispense:  28 tablet    Refill:  0     Procedures: No procedures performed  Clinical Data: No additional findings.  ROS:  All other systems negative, except as noted in the HPI. Review of Systems  Objective: Vital Signs: Ht 5\' 6"  (1.676 m)   Wt 300 lb (136.1 kg)   BMI 48.42 kg/m   Specialty Comments:  No specialty comments available.  PMFS History: Patient Active Problem List   Diagnosis Date Noted  . Subtherapeutic international normalized ratio (INR)   . Postoperative pain   .  Hypoalbuminemia   . Acute on chronic kidney failure ([place])   . Acute blood loss anemia   . Benign essential HTN   . Debility 10/05/2018  . Chronic ulcer of right leg, with fat layer exposed ([place]) 09/28/2018  . Chronic ulcer of leg, right, with necrosis of muscle ([place])   . Iron deficiency anemia 01/29/2018  . Pulmonary embolus ([place]) 01/09/2018  . Encounter for therapeutic drug monitoring 01/09/2018  . Severe malnutrition ([place]) 08/24/2017  . Acute kidney injury superimposed on CKD ([place]) 12/28/2016  . Hx of skin graft 08/01/2016  . 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 ([place]) 07/25/2016  . Idiopathic chronic venous hypertension of right lower extremity with  ulcer and inflammation ([place]) 07/05/2016  . Trichomonal infection 11/24/2015  . Abdominal pain 05/02/2015  . Symptomatic cholelithiasis 04/16/2015  . Acute bilateral upper abdominal pain 04/16/2015  . Microcytic anemia 05/18/2013  . Hypotension, unspecified 05/17/2013  . CKD (chronic kidney disease), stage III ([place]) 05/17/2013  . Hypokalemia 05/17/2013  . Anal fissure 02/06/2013  . Anal skin tag 02/06/2013  . ALLERGIC RHINITIS 03/05/2010  . LOW BACK PAIN SYNDROME 12/13/2007  . Local infection of skin and subcutaneous tissue 10/10/2007  . OBESITY, MORBID 12/02/2006  . HTN (hypertension) 12/02/2006  . History of pulmonary embolism 12/02/2006  . History of DVT (deep vein thrombosis) 12/02/2006  . 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 GASTROESOPHAGEAL REFLUX DISEASE 12/02/2006  . OSA (obstructive sleep apnea) 12/02/2006   Past Medical History:  Diagnosis Date  . Anemia   . Anxiety   . Arthritis   . Chronic bronchitis ([place])   . Chronic upper back pain   . Depression   . DVT (deep venous thrombosis) (HCC)    BLE  . GERD (gastroesophageal reflux disease)   . Headache    "weekly" (04/16/2015)  . Hypertension   . Migraine    "monthly" (04/16/2015)  . Morbid obesity with BMI of 50.0-59.9, adult ([place])   . Obesity   . Pneumonia 2014  . Pulmonary embolism ([place])   . Sinusitis nasal   . Sleep apnea    "I'm suppose to wear a mask but I don't" (04/16/2015)  . Varicose veins of right lower extremity   . Venous stasis of lower extremity    right  . Wears dentures   . Wears glasses     Family History  Problem Relation Age of Onset  . Kidney disease Mother        kidney transplant    Past Surgical History:  Procedure Laterality Date  . CHOLECYSTECTOMY N/A 04/18/2015   Procedure: LAPAROSCOPIC CHOLECYSTECTOMY;  Surgeon: Ralene Ok, MD;  Location: [place];  Service: General;  Laterality: N/A;  . HYSTEROSCOPY W/D&C  12/24/2001   Archie Endo 09/28/2010  . I&D EXTREMITY  Right 07/25/2016   Procedure: IRRIGATION AND DEBRIDEMENT RIGHT LEG ULCER, APPLY VERAFLO VAC;  Surgeon: [name], MD;  Location: [place];  Service: Orthopedics;  Laterality: Right;  . I&D EXTREMITY Right 09/28/2018   Procedure: DEBRIDEMENT RIGHT LOWER LEG WITH PLACEMENT WITH PLACEMENT OF SKIN GRAFT AND VAC;  Surgeon: [name], MD;  Location: [place];  Service: Orthopedics;  Laterality: Right;  . I&D EXTREMITY Right 10/03/2018   Procedure: REPEAT IRRIGATION AND DEBRIDEMENT RIGHT LOWER LEG, VAC PLACEMENT;  Surgeon: [name], MD;  Location: [place];  Service: Orthopedics;  Laterality: Right;  . INCISE AND DRAIN ABCESS Right 07/14/2016  . INCISION AND DRAINAGE Right 09/10/2008  leg:  skin and soft tissue and muscle/notes 09/15/2010  . INCISION AND DRAINAGE Right 01/01/2008   Chronic venous stasis insufficiency ulcer,/notes 09/14/2010/  . INCISION AND DRAINAGE Right 11/2092   calf w/application wound vac/notes 07/20/2010  . INCISION AND DRAINAGE Right 07/25/2016   IRRIGATION AND DEBRIDEMENT RIGHT LEG ULCER,  . LAPAROSCOPIC GASTRIC BYPASS  ~ 2007  . MULTIPLE TOOTH EXTRACTIONS    . SKIN GRAFT SPLIT THICKNESS LEG / FOOT Right 07/25/2016   LEG  . SKIN SPLIT GRAFT Right 07/27/2016   Procedure: SKIN GRAFT RIGHT LEG WITH THERASKIN APPLICATION;  Surgeon: [name], MD;  Location: [place];  Service: Orthopedics;  Laterality: Right;  . TONSILLECTOMY     Social History   Occupational History  . Not on file  Tobacco Use  . Smoking status: Never Smoker  . Smokeless tobacco: Never Used  Substance and Sexual Activity  . Alcohol use: No  . Drug use: No  . Sexual activity: Yes    Partners: Male    Birth control/protection: I.U.D.

## 2018-11-01 DIAGNOSIS — D509 Iron deficiency anemia, unspecified: Secondary | ICD-10-CM | POA: Diagnosis not present

## 2018-11-01 DIAGNOSIS — I129 Hypertensive chronic kidney disease with stage 1 through stage 4 chronic kidney disease, or unspecified chronic kidney disease: Secondary | ICD-10-CM | POA: Diagnosis not present

## 2018-11-01 DIAGNOSIS — I878 Other specified disorders of veins: Secondary | ICD-10-CM | POA: Diagnosis not present

## 2018-11-01 DIAGNOSIS — N189 Chronic kidney disease, unspecified: Secondary | ICD-10-CM | POA: Diagnosis not present

## 2018-11-01 DIAGNOSIS — J42 Unspecified chronic bronchitis: Secondary | ICD-10-CM | POA: Diagnosis not present

## 2018-11-01 DIAGNOSIS — L97211 Non-pressure chronic ulcer of right calf limited to breakdown of skin: Secondary | ICD-10-CM | POA: Diagnosis not present

## 2018-11-05 ENCOUNTER — Telehealth: Payer: Self-pay | Admitting: Orthopedic Surgery

## 2018-11-05 NOTE — Telephone Encounter (Signed)
Kiouna with [facility] states that patient declined nurse services to wrap her leg. Patient states she can do her own leg wrap. Kiouna's callback # [phone]

## 2018-11-05 NOTE — Telephone Encounter (Signed)
Called and lm on vm to advise that it is ok to d/c [facility] service. If any questions to call back.

## 2018-11-06 ENCOUNTER — Ambulatory Visit (INDEPENDENT_AMBULATORY_CARE_PROVIDER_SITE_OTHER): Payer: Medicare Other | Admitting: Physician Assistant

## 2018-11-06 ENCOUNTER — Other Ambulatory Visit: Payer: Self-pay

## 2018-11-06 ENCOUNTER — Encounter: Payer: Self-pay | Admitting: Physician Assistant

## 2018-11-06 VITALS — Ht 66.0 in | Wt 300.0 lb

## 2018-11-06 DIAGNOSIS — L97919 Non-pressure chronic ulcer of unspecified part of right lower leg with unspecified severity: Secondary | ICD-10-CM

## 2018-11-06 DIAGNOSIS — Z945 Skin transplant status: Secondary | ICD-10-CM | POA: Diagnosis not present

## 2018-11-06 DIAGNOSIS — I87331 Chronic venous hypertension (idiopathic) with ulcer and inflammation of right lower extremity: Secondary | ICD-10-CM | POA: Diagnosis not present

## 2018-11-06 MED ORDER — OXYCODONE HCL 10 MG PO TABS: 10.0000 mg | ORAL_TABLET | Freq: Four times a day (QID) | ORAL | 0 refills | Status: DC | PRN

## 2018-11-06 NOTE — Progress Notes (Signed)
Office Visit Note   Patient: Alice [name]           Date of Birth: 12/09/1970           MRN: [id number] Visit Date: 11/06/2018              Requested by: Benito Mccreedy, MD [address] [place],  [place] [address] PCP: Benito Mccreedy, MD  Chief Complaint  Patient presents with  . Right Leg - Routine Post Op    09/28/18 RLE [facility]      HPI: The patient is a 48 year old woman who is seen for postoperative follow-up following debridement of her right lower extremity ulcer and placement of a split-thickness skin graft on 09/28/2018.  She has been undergoing Dynaflex multilayer compression wrap dressing changes twice a week.  She changed her dressing this past Friday.  She reports some greenish drainage odor and continued swelling over the area.   Assessment & Plan: Visit Diagnoses:  1. Hx of skin graft   2. 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  We will continue Dynaflex wraps for compression with Vaseline gauze over the graft area.  She will change this once during the week and we will see her next week for follow-up.  She should continue to elevate as much as possible.  Her pain medication was refilled today.  Follow-Up Instructions: Return in about 1 week (around 11/13/2018).   Ortho Exam  Patient is alert, oriented, no adenopathy, well-dressed, normal affect, normal respiratory effort. The graft is well incorporated and staples will be harvested today.  She has scant serous weeping from the wound.  She has excellent beefy granulation with 10% yellow fibrinous tissue.  There is no peri-wound irritation or signs of infection or cellulitis.  Imaging: No results found.   Labs: Lab Results  Component Value Date   REPTSTATUS 10/06/2018 FINAL 10/05/2018   GRAMSTAIN  09/28/2018    FEW WBC PRESENT, PREDOMINANTLY PMN FEW GRAM POSITIVE COCCI IN PAIRS FEW GRAM POSITIVE  RODS    CULT 20,000 COLONIES/mL YEAST (A) 10/05/2018   LABORGA PROTEUS MIRABILIS 09/28/2018   LABORGA STAPHYLOCOCCUS CAPRAE 09/28/2018   LABORGA SERRATIA MARCESCENS 09/28/2018     Lab Results  Component Value Date   ALBUMIN 2.5 (L) 10/08/2018   ALBUMIN 3.2 (L) 01/29/2018   ALBUMIN 2.2 (L) 01/03/2017    Body mass index is 48.42 kg/m.  Orders:  No orders of the defined types were placed in this encounter.  Meds ordered this encounter  Medications  . Oxycodone HCl 10 MG TABS    Sig: Take 1 tablet (10 mg total) by mouth every 6 (six) hours as needed.    Dispense:  28 tablet    Refill:  0     Procedures: No procedures performed  Clinical Data: No additional findings.  ROS:  All other systems negative, except as noted in the HPI. Review of Systems  Objective: Vital Signs: Ht 5\' 6"  (1.676 m)   Wt 300 lb (136.1 kg)   BMI 48.42 kg/m   Specialty Comments:  No specialty comments available.  PMFS History: Patient Active Problem List   Diagnosis Date Noted  . Subtherapeutic international normalized ratio (INR)   . Postoperative pain   . Hypoalbuminemia   . Acute on chronic kidney failure ([place])   . Acute blood loss anemia   . Benign essential HTN   .  Debility 10/05/2018  . Chronic ulcer of right leg, with fat layer exposed ([place]) 09/28/2018  . Chronic ulcer of leg, right, with necrosis of muscle ([place])   . Iron deficiency anemia 01/29/2018  . Pulmonary embolus ([place]) 01/09/2018  . Encounter for therapeutic drug monitoring 01/09/2018  . Severe malnutrition ([place]) 08/24/2017  . Acute kidney injury superimposed on CKD ([place]) 12/28/2016  . Hx of skin graft 08/01/2016  . 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 ([place]) 07/25/2016  . 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 Trichomonal infection 11/24/2015  . Abdominal pain 05/02/2015  . Symptomatic cholelithiasis 04/16/2015  . Acute  bilateral upper abdominal pain 04/16/2015  . Microcytic anemia 05/18/2013  . Hypotension, unspecified 05/17/2013  . CKD (chronic kidney disease), stage III ([place]) 05/17/2013  . Hypokalemia 05/17/2013  . Anal fissure 02/06/2013  . Anal skin tag 02/06/2013  . ALLERGIC RHINITIS 03/05/2010  . LOW BACK PAIN SYNDROME 12/13/2007  . Local infection of skin and subcutaneous tissue 10/10/2007  . OBESITY, MORBID 12/02/2006  . HTN (hypertension) 12/02/2006  . History of pulmonary embolism 12/02/2006  . History of DVT (deep vein thrombosis) 12/02/2006  . 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 GASTROESOPHAGEAL REFLUX DISEASE 12/02/2006  . OSA (obstructive sleep apnea) 12/02/2006   Past Medical History:  Diagnosis Date  . Anemia   . Anxiety   . Arthritis   . Chronic bronchitis ([place])   . Chronic upper back pain   . Depression   . DVT (deep venous thrombosis) (HCC)    BLE  . GERD (gastroesophageal reflux disease)   . Headache    "weekly" (04/16/2015)  . Hypertension   . Migraine    "monthly" (04/16/2015)  . Morbid obesity with BMI of 50.0-59.9, adult ([place])   . Obesity   . Pneumonia 2014  . Pulmonary embolism ([place])   . Sinusitis nasal   . Sleep apnea    "I'm suppose to wear a mask but I don't" (04/16/2015)  . Varicose veins of right lower extremity   . Venous stasis of lower extremity    right  . Wears dentures   . Wears glasses     Family History  Problem Relation Age of Onset  . Kidney disease Mother        kidney transplant    Past Surgical History:  Procedure Laterality Date  . CHOLECYSTECTOMY N/A 04/18/2015   Procedure: LAPAROSCOPIC CHOLECYSTECTOMY;  Surgeon: Ralene Ok, MD;  Location: [place];  Service: General;  Laterality: N/A;  . HYSTEROSCOPY W/D&C  12/24/2001   Archie Endo 09/28/2010  . I&D EXTREMITY Right 07/25/2016   Procedure: IRRIGATION AND DEBRIDEMENT RIGHT LEG ULCER, APPLY VERAFLO VAC;  Surgeon: [name], MD;  Location: [place];  Service: Orthopedics;   Laterality: Right;  . I&D EXTREMITY Right 09/28/2018   Procedure: DEBRIDEMENT RIGHT LOWER LEG WITH PLACEMENT WITH PLACEMENT OF SKIN GRAFT AND VAC;  Surgeon: [name], MD;  Location: [place];  Service: Orthopedics;  Laterality: Right;  . I&D EXTREMITY Right 10/03/2018   Procedure: REPEAT IRRIGATION AND DEBRIDEMENT RIGHT LOWER LEG, VAC PLACEMENT;  Surgeon: [name], MD;  Location: [place];  Service: Orthopedics;  Laterality: Right;  . INCISE AND DRAIN ABCESS Right 07/14/2016  . INCISION AND DRAINAGE Right 09/10/2008   leg:  skin and soft tissue and muscle/notes 09/15/2010  . INCISION AND DRAINAGE Right 01/01/2008   Chronic venous stasis insufficiency ulcer,/notes 09/14/2010/  .  INCISION AND DRAINAGE Right 12/1101   calf w/application wound vac/notes 07/20/2010  . INCISION AND DRAINAGE Right 07/25/2016   IRRIGATION AND DEBRIDEMENT RIGHT LEG ULCER,  . LAPAROSCOPIC GASTRIC BYPASS  ~ 2007  . MULTIPLE TOOTH EXTRACTIONS    . SKIN GRAFT SPLIT THICKNESS LEG / FOOT Right 07/25/2016   LEG  . SKIN SPLIT GRAFT Right 07/27/2016   Procedure: SKIN GRAFT RIGHT LEG WITH THERASKIN APPLICATION;  Surgeon: [name], MD;  Location: [place];  Service: Orthopedics;  Laterality: Right;  . TONSILLECTOMY     Social History   Occupational History  . Not on file  Tobacco Use  . Smoking status: Never Smoker  . Smokeless tobacco: Never Used  Substance and Sexual Activity  . Alcohol use: No  . Drug use: No  . Sexual activity: Yes    Partners: Male    Birth control/protection: I.U.D.

## 2018-11-08 DIAGNOSIS — I878 Other specified disorders of veins: Secondary | ICD-10-CM | POA: Diagnosis not present

## 2018-11-08 DIAGNOSIS — N189 Chronic kidney disease, unspecified: Secondary | ICD-10-CM | POA: Diagnosis not present

## 2018-11-08 DIAGNOSIS — J42 Unspecified chronic bronchitis: Secondary | ICD-10-CM | POA: Diagnosis not present

## 2018-11-08 DIAGNOSIS — L97211 Non-pressure chronic ulcer of right calf limited to breakdown of skin: Secondary | ICD-10-CM | POA: Diagnosis not present

## 2018-11-08 DIAGNOSIS — I129 Hypertensive chronic kidney disease with stage 1 through stage 4 chronic kidney disease, or unspecified chronic kidney disease: Secondary | ICD-10-CM | POA: Diagnosis not present

## 2018-11-08 DIAGNOSIS — D509 Iron deficiency anemia, unspecified: Secondary | ICD-10-CM | POA: Diagnosis not present

## 2018-11-09 DIAGNOSIS — G4733 Obstructive sleep apnea (adult) (pediatric): Secondary | ICD-10-CM | POA: Diagnosis not present

## 2018-11-09 DIAGNOSIS — E559 Vitamin D deficiency, unspecified: Secondary | ICD-10-CM | POA: Diagnosis not present

## 2018-11-09 DIAGNOSIS — R7303 Prediabetes: Secondary | ICD-10-CM | POA: Diagnosis not present

## 2018-11-09 DIAGNOSIS — I10 Essential (primary) hypertension: Secondary | ICD-10-CM | POA: Diagnosis not present

## 2018-11-09 DIAGNOSIS — R1013 Epigastric pain: Secondary | ICD-10-CM | POA: Diagnosis not present

## 2018-11-09 DIAGNOSIS — G629 Polyneuropathy, unspecified: Secondary | ICD-10-CM | POA: Diagnosis not present

## 2018-11-09 DIAGNOSIS — F329 Major depressive disorder, single episode, unspecified: Secondary | ICD-10-CM | POA: Diagnosis not present

## 2018-11-13 ENCOUNTER — Ambulatory Visit (INDEPENDENT_AMBULATORY_CARE_PROVIDER_SITE_OTHER): Payer: Medicare Other | Admitting: Family

## 2018-11-13 ENCOUNTER — Encounter: Payer: Self-pay | Admitting: Family

## 2018-11-13 ENCOUNTER — Other Ambulatory Visit: Payer: Self-pay

## 2018-11-13 ENCOUNTER — Ambulatory Visit: Payer: Medicare Other | Admitting: Physician Assistant

## 2018-11-13 DIAGNOSIS — I87331 Chronic venous hypertension (idiopathic) with ulcer and inflammation of right lower extremity: Secondary | ICD-10-CM

## 2018-11-13 DIAGNOSIS — Z945 Skin transplant status: Secondary | ICD-10-CM | POA: Diagnosis not present

## 2018-11-13 DIAGNOSIS — L97919 Non-pressure chronic ulcer of unspecified part of right lower leg with unspecified severity: Secondary | ICD-10-CM

## 2018-11-13 MED ORDER — OXYCODONE HCL 10 MG PO TABS: 10.0000 mg | ORAL_TABLET | Freq: Four times a day (QID) | ORAL | 0 refills | Status: DC | PRN

## 2018-11-13 NOTE — Progress Notes (Signed)
Office Visit Note   Patient: Alice [name]           Date of Birth: 1970/08/05           MRN: [id number] Visit Date: 11/13/2018              Requested by: Benito Mccreedy, MD [address] [place],  [place] [address] PCP: Benito Mccreedy, MD  No chief complaint on file.     HPI: The patient is a 48 year old woman who is seen for postoperative follow-up following debridement of her right lower extremity ulcer and placement of a split-thickness skin graft on 09/28/2018.  She has been undergoing Dynaflex multilayer compression wrap dressing changes twice a week. Today she is without concerns.  Assessment & Plan: Visit Diagnoses:  1. 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 Hx of skin graft     Plan: will hold on additonal staple removal until next visit.  We will continue Dynaflex wraps for compression with Vaseline gauze over the graft area.  She will change this once during the week and we will see her next week for follow-up.  She should continue to elevate as much as possible.  Her pain medication was refilled today.  Follow-Up Instructions: Return in about 1 week (around 11/20/2018).   Ortho Exam  Patient is alert, oriented, no adenopathy, well-dressed, normal affect, normal respiratory effort. The graft is well incorporated and staples will be harvested today.  She has scant serous drainage from the wound.  She has excellent beefy granulation with 20% yellow fibrinous tissue.  There is no peri-wound maceration. No sign of infection or cellulitis.  Imaging: No results found.   Labs: Lab Results  Component Value Date   REPTSTATUS 10/06/2018 FINAL 10/05/2018   GRAMSTAIN  09/28/2018    FEW WBC PRESENT, PREDOMINANTLY PMN FEW GRAM POSITIVE COCCI IN PAIRS FEW GRAM POSITIVE RODS    CULT 20,000 COLONIES/mL YEAST (A) 10/05/2018   LABORGA PROTEUS MIRABILIS 09/28/2018   LABORGA STAPHYLOCOCCUS CAPRAE  09/28/2018   LABORGA SERRATIA MARCESCENS 09/28/2018     Lab Results  Component Value Date   ALBUMIN 2.5 (L) 10/08/2018   ALBUMIN 3.2 (L) 01/29/2018   ALBUMIN 2.2 (L) 01/03/2017    There is no height or weight on file to calculate BMI.  Orders:  No orders of the defined types were placed in this encounter.  No orders of the defined types were placed in this encounter.    Procedures: No procedures performed  Clinical Data: No additional findings.  ROS:  All other systems negative, except as noted in the HPI. Review of Systems  Constitutional: Negative for chills and fever.  Skin: Positive for wound.    Objective: Vital Signs: There were no vitals taken for this visit.  Specialty Comments:  No specialty comments available.  PMFS History: Patient Active Problem List   Diagnosis Date Noted  . Subtherapeutic international normalized ratio (INR)   . Postoperative pain   . Hypoalbuminemia   . Acute on chronic kidney failure ([place])   . Acute blood loss anemia   . Benign essential HTN   . Debility 10/05/2018  . Chronic ulcer of right leg, with fat layer exposed ([place]) 09/28/2018  . Chronic ulcer of leg, right, with necrosis of muscle ([place])   . Iron deficiency anemia 01/29/2018  . Pulmonary embolus ([place]) 01/09/2018  . Encounter for therapeutic drug monitoring 01/09/2018  . Severe malnutrition ([place])  08/24/2017  . Acute kidney injury superimposed on CKD ([place]) 12/28/2016  . Hx of skin graft 08/01/2016  . 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 ([place]) 07/25/2016  . 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 Trichomonal infection 11/24/2015  . Abdominal pain 05/02/2015  . Symptomatic cholelithiasis 04/16/2015  . Acute bilateral upper abdominal pain 04/16/2015  . Microcytic anemia 05/18/2013  . Hypotension, unspecified 05/17/2013  . CKD (chronic kidney disease), stage III ([place])  05/17/2013  . Hypokalemia 05/17/2013  . Anal fissure 02/06/2013  . Anal skin tag 02/06/2013  . ALLERGIC RHINITIS 03/05/2010  . LOW BACK PAIN SYNDROME 12/13/2007  . Local infection of skin and subcutaneous tissue 10/10/2007  . OBESITY, MORBID 12/02/2006  . HTN (hypertension) 12/02/2006  . History of pulmonary embolism 12/02/2006  . History of DVT (deep vein thrombosis) 12/02/2006  . 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 GASTROESOPHAGEAL REFLUX DISEASE 12/02/2006  . OSA (obstructive sleep apnea) 12/02/2006   Past Medical History:  Diagnosis Date  . Anemia   . Anxiety   . Arthritis   . Chronic bronchitis ([place])   . Chronic upper back pain   . Depression   . DVT (deep venous thrombosis) (HCC)    BLE  . GERD (gastroesophageal reflux disease)   . Headache    "weekly" (04/16/2015)  . Hypertension   . Migraine    "monthly" (04/16/2015)  . Morbid obesity with BMI of 50.0-59.9, adult ([place])   . Obesity   . Pneumonia 2014  . Pulmonary embolism ([place])   . Sinusitis nasal   . Sleep apnea    "I'm suppose to wear a mask but I don't" (04/16/2015)  . Varicose veins of right lower extremity   . Venous stasis of lower extremity    right  . Wears dentures   . Wears glasses     Family History  Problem Relation Age of Onset  . Kidney disease Mother        kidney transplant    Past Surgical History:  Procedure Laterality Date  . CHOLECYSTECTOMY N/A 04/18/2015   Procedure: LAPAROSCOPIC CHOLECYSTECTOMY;  Surgeon: Ralene Ok, MD;  Location: [facility];  Service: General;  Laterality: N/A;  . HYSTEROSCOPY W/D&C  12/24/2001   Archie Endo 09/28/2010  . I&D EXTREMITY Right 07/25/2016   Procedure: IRRIGATION AND DEBRIDEMENT RIGHT LEG ULCER, APPLY VERAFLO VAC;  Surgeon: [name], MD;  Location: [facility];  Service: Orthopedics;  Laterality: Right;  . I&D EXTREMITY Right 09/28/2018   Procedure: DEBRIDEMENT RIGHT LOWER LEG WITH PLACEMENT WITH PLACEMENT OF SKIN GRAFT AND VAC;  Surgeon: [name], MD;  Location: [facility];  Service: Orthopedics;  Laterality: Right;  . I&D EXTREMITY Right 10/03/2018   Procedure: REPEAT IRRIGATION AND DEBRIDEMENT RIGHT LOWER LEG, VAC PLACEMENT;  Surgeon: [name], MD;  Location: [facility];  Service: Orthopedics;  Laterality: Right;  . INCISE AND DRAIN ABCESS Right 07/14/2016  . INCISION AND DRAINAGE Right 09/10/2008   leg:  skin and soft tissue and muscle/notes 09/15/2010  . INCISION AND DRAINAGE Right 01/01/2008   Chronic venous stasis insufficiency ulcer,/notes 09/14/2010/  . INCISION AND DRAINAGE Right 07/1538   calf w/application wound vac/notes 07/20/2010  . INCISION AND DRAINAGE Right 07/25/2016   IRRIGATION AND DEBRIDEMENT RIGHT LEG ULCER,  . LAPAROSCOPIC GASTRIC BYPASS  ~ 2007  . MULTIPLE TOOTH EXTRACTIONS    . SKIN GRAFT SPLIT THICKNESS LEG / FOOT Right 07/25/2016  LEG  . SKIN SPLIT GRAFT Right 07/27/2016   Procedure: SKIN GRAFT RIGHT LEG WITH THERASKIN APPLICATION;  Surgeon: [name], MD;  Location: [facility];  Service: Orthopedics;  Laterality: Right;  . TONSILLECTOMY     Social History   Occupational History  . Not on file  Tobacco Use  . Smoking status: Never Smoker  . Smokeless tobacco: Never Used  Substance and Sexual Activity  . Alcohol use: No  . Drug use: No  . Sexual activity: Yes    Partners: Male    Birth control/protection: I.U.D.

## 2018-11-14 ENCOUNTER — Telehealth: Payer: Self-pay

## 2018-11-14 DIAGNOSIS — J42 Unspecified chronic bronchitis: Secondary | ICD-10-CM | POA: Diagnosis not present

## 2018-11-14 DIAGNOSIS — I878 Other specified disorders of veins: Secondary | ICD-10-CM | POA: Diagnosis not present

## 2018-11-14 DIAGNOSIS — Z86711 Personal history of pulmonary embolism: Secondary | ICD-10-CM | POA: Diagnosis not present

## 2018-11-14 DIAGNOSIS — Z86718 Personal history of other venous thrombosis and embolism: Secondary | ICD-10-CM | POA: Diagnosis not present

## 2018-11-14 DIAGNOSIS — Z7901 Long term (current) use of anticoagulants: Secondary | ICD-10-CM | POA: Diagnosis not present

## 2018-11-14 DIAGNOSIS — N189 Chronic kidney disease, unspecified: Secondary | ICD-10-CM | POA: Diagnosis not present

## 2018-11-14 DIAGNOSIS — D509 Iron deficiency anemia, unspecified: Secondary | ICD-10-CM | POA: Diagnosis not present

## 2018-11-14 DIAGNOSIS — Z4800 Encounter for change or removal of nonsurgical wound dressing: Secondary | ICD-10-CM | POA: Diagnosis not present

## 2018-11-14 DIAGNOSIS — I129 Hypertensive chronic kidney disease with stage 1 through stage 4 chronic kidney disease, or unspecified chronic kidney disease: Secondary | ICD-10-CM | POA: Diagnosis not present

## 2018-11-14 DIAGNOSIS — L97211 Non-pressure chronic ulcer of right calf limited to breakdown of skin: Secondary | ICD-10-CM | POA: Diagnosis not present

## 2018-11-14 NOTE — Telephone Encounter (Signed)
Tharon Aquas, PT with [facility] called requesting verbal orders to continue HHPT 1wk4 for strenghtening, ambulation and endurance training. Orders approved and given.

## 2018-11-20 ENCOUNTER — Other Ambulatory Visit: Payer: Self-pay

## 2018-11-20 ENCOUNTER — Ambulatory Visit (INDEPENDENT_AMBULATORY_CARE_PROVIDER_SITE_OTHER): Payer: Medicare Other | Admitting: Family

## 2018-11-20 ENCOUNTER — Encounter: Payer: Self-pay | Admitting: Family

## 2018-11-20 VITALS — Ht 66.0 in | Wt 300.0 lb

## 2018-11-20 DIAGNOSIS — I87331 Chronic venous hypertension (idiopathic) with ulcer and inflammation of right lower extremity: Secondary | ICD-10-CM | POA: Diagnosis not present

## 2018-11-20 DIAGNOSIS — L97919 Non-pressure chronic ulcer of unspecified part of right lower leg with unspecified severity: Secondary | ICD-10-CM | POA: Diagnosis not present

## 2018-11-20 DIAGNOSIS — Z945 Skin transplant status: Secondary | ICD-10-CM

## 2018-11-20 MED ORDER — OXYCODONE HCL 10 MG PO TABS: 10.0000 mg | ORAL_TABLET | Freq: Three times a day (TID) | ORAL | 0 refills | Status: DC | PRN

## 2018-11-21 ENCOUNTER — Encounter: Payer: Medicare Other | Admitting: Physical Medicine & Rehabilitation

## 2018-11-21 ENCOUNTER — Ambulatory Visit (INDEPENDENT_AMBULATORY_CARE_PROVIDER_SITE_OTHER): Payer: Medicare Other | Admitting: Physical Medicine and Rehabilitation

## 2018-11-21 DIAGNOSIS — R202 Paresthesia of skin: Secondary | ICD-10-CM | POA: Diagnosis not present

## 2018-11-21 NOTE — Progress Notes (Signed)
Office Visit Note   Patient: Alice [name]           Date of Birth: 06-01-1970           MRN: [id number] Visit Date: 11/20/2018              Requested by: Benito Mccreedy, MD [address] [place],  [place] [address] PCP: Benito Mccreedy, MD  Chief Complaint  Patient presents with  . Right Leg - Routine Post Op      HPI: The patient is a 48 year old woman who is seen for postoperative follow-up following debridement of her right lower extremity ulcer and placement of a split-thickness skin graft on 09/28/2018.  She has been undergoing Dynaflex multilayer compression wrap dressing changes twice a week.  Once in office and once at home by her.  Assessment & Plan: Visit Diagnoses:  1. 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 Hx of skin graft     Plan: additional staples were harvested today.  We will continue Dynaflex wraps for compression with Vaseline gauze over the graft area.  She will change this once during the week and we will see her next week for follow-up.  She should continue to elevate as much as possible.  Her pain medication was refilled today.  Follow-Up Instructions: Return in about 1 week (around 11/27/2018).   Ortho Exam  Patient is alert, oriented, no adenopathy, well-dressed, normal affect, normal respiratory effort. A few remaining staples will be harvested today.  She has scant serosanguinous drainage from the wound.  She has excellent beefy granulation with 10% yellow fibrinous tissue.  There is no peri-wound irritation or signs of infection or cellulitis.  Imaging: No results found.   Labs: Lab Results  Component Value Date   REPTSTATUS 10/06/2018 FINAL 10/05/2018   GRAMSTAIN  09/28/2018    FEW WBC PRESENT, PREDOMINANTLY PMN FEW GRAM POSITIVE COCCI IN PAIRS FEW GRAM POSITIVE RODS    CULT 20,000 COLONIES/mL YEAST (A) 10/05/2018   LABORGA PROTEUS MIRABILIS 09/28/2018   LABORGA STAPHYLOCOCCUS CAPRAE 09/28/2018   LABORGA SERRATIA MARCESCENS 09/28/2018     Lab Results  Component Value Date   ALBUMIN 2.5 (L) 10/08/2018   ALBUMIN 3.2 (L) 01/29/2018   ALBUMIN 2.2 (L) 01/03/2017    Body mass index is 48.42 kg/m.  Orders:  No orders of the defined types were placed in this encounter.  Meds ordered this encounter  Medications  . Oxycodone HCl 10 MG TABS    Sig: Take 1 tablet (10 mg total) by mouth 3 (three) times daily as needed.    Dispense:  21 tablet    Refill:  0     Procedures: No procedures performed  Clinical Data: No additional findings.  ROS:  All other systems negative, except as noted in the HPI. Review of Systems  Constitutional: Negative for chills and fever.  Cardiovascular: Negative for leg swelling.  Skin: Positive for wound. Negative for color change.    Objective: Vital Signs: Ht 5\' 6"  (1.676 m)   Wt 300 lb (136.1 kg)   BMI 48.42 kg/m   Specialty Comments:  No specialty comments available.  PMFS History: Patient Active Problem List   Diagnosis Date Noted  . Subtherapeutic international normalized ratio (INR)   . Postoperative pain   . Hypoalbuminemia   . Acute on chronic kidney failure ([place])   . Acute blood loss anemia   . Benign essential HTN   .  Debility 10/05/2018  . Chronic ulcer of right leg, with fat layer exposed ([place]) 09/28/2018  . Chronic ulcer of leg, right, with necrosis of muscle ([place])   . Iron deficiency anemia 01/29/2018  . Pulmonary embolus ([place]) 01/09/2018  . Encounter for therapeutic drug monitoring 01/09/2018  . Severe malnutrition ([place]) 08/24/2017  . Acute kidney injury superimposed on CKD ([place]) 12/28/2016  . Hx of skin graft 08/01/2016  . 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 ([place]) 07/25/2016  . 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 Trichomonal infection 11/24/2015  . Abdominal pain  05/02/2015  . Symptomatic cholelithiasis 04/16/2015  . Acute bilateral upper abdominal pain 04/16/2015  . Microcytic anemia 05/18/2013  . Hypotension, unspecified 05/17/2013  . CKD (chronic kidney disease), stage III ([place]) 05/17/2013  . Hypokalemia 05/17/2013  . Anal fissure 02/06/2013  . Anal skin tag 02/06/2013  . ALLERGIC RHINITIS 03/05/2010  . LOW BACK PAIN SYNDROME 12/13/2007  . Local infection of skin and subcutaneous tissue 10/10/2007  . OBESITY, MORBID 12/02/2006  . HTN (hypertension) 12/02/2006  . History of pulmonary embolism 12/02/2006  . History of DVT (deep vein thrombosis) 12/02/2006  . 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 GASTROESOPHAGEAL REFLUX DISEASE 12/02/2006  . OSA (obstructive sleep apnea) 12/02/2006   Past Medical History:  Diagnosis Date  . Anemia   . Anxiety   . Arthritis   . Chronic bronchitis ([place])   . Chronic upper back pain   . Depression   . DVT (deep venous thrombosis) (HCC)    BLE  . GERD (gastroesophageal reflux disease)   . Headache    "weekly" (04/16/2015)  . Hypertension   . Migraine    "monthly" (04/16/2015)  . Morbid obesity with BMI of 50.0-59.9, adult ([place])   . Obesity   . Pneumonia 2014  . Pulmonary embolism ([place])   . Sinusitis nasal   . Sleep apnea    "I'm suppose to wear a mask but I don't" (04/16/2015)  . Varicose veins of right lower extremity   . Venous stasis of lower extremity    right  . Wears dentures   . Wears glasses     Family History  Problem Relation Age of Onset  . Kidney disease Mother        kidney transplant    Past Surgical History:  Procedure Laterality Date  . CHOLECYSTECTOMY N/A 04/18/2015   Procedure: LAPAROSCOPIC CHOLECYSTECTOMY;  Surgeon: Ralene Ok, MD;  Location: [facility];  Service: General;  Laterality: N/A;  . HYSTEROSCOPY W/D&C  12/24/2001   Archie Endo 09/28/2010  . I&D EXTREMITY Right 07/25/2016   Procedure: IRRIGATION AND DEBRIDEMENT RIGHT LEG ULCER, APPLY VERAFLO VAC;   Surgeon: [name], MD;  Location: [facility];  Service: Orthopedics;  Laterality: Right;  . I&D EXTREMITY Right 09/28/2018   Procedure: DEBRIDEMENT RIGHT LOWER LEG WITH PLACEMENT WITH PLACEMENT OF SKIN GRAFT AND VAC;  Surgeon: [name], MD;  Location: [facility];  Service: Orthopedics;  Laterality: Right;  . I&D EXTREMITY Right 10/03/2018   Procedure: REPEAT IRRIGATION AND DEBRIDEMENT RIGHT LOWER LEG, VAC PLACEMENT;  Surgeon: [name], MD;  Location: [facility];  Service: Orthopedics;  Laterality: Right;  . INCISE AND DRAIN ABCESS Right 07/14/2016  . INCISION AND DRAINAGE Right 09/10/2008   leg:  skin and soft tissue and muscle/notes 09/15/2010  . INCISION AND DRAINAGE Right 01/01/2008   Chronic venous stasis insufficiency ulcer,/notes 09/14/2010/  .  INCISION AND DRAINAGE Right 10/2692   calf w/application wound vac/notes 07/20/2010  . INCISION AND DRAINAGE Right 07/25/2016   IRRIGATION AND DEBRIDEMENT RIGHT LEG ULCER,  . LAPAROSCOPIC GASTRIC BYPASS  ~ 2007  . MULTIPLE TOOTH EXTRACTIONS    . SKIN GRAFT SPLIT THICKNESS LEG / FOOT Right 07/25/2016   LEG  . SKIN SPLIT GRAFT Right 07/27/2016   Procedure: SKIN GRAFT RIGHT LEG WITH THERASKIN APPLICATION;  Surgeon: [name], MD;  Location: [facility];  Service: Orthopedics;  Laterality: Right;  . TONSILLECTOMY     Social History   Occupational History  . Not on file  Tobacco Use  . Smoking status: Never Smoker  . Smokeless tobacco: Never Used  Substance and Sexual Activity  . Alcohol use: No  . Drug use: No  . Sexual activity: Yes    Partners: Male    Birth control/protection: I.U.D.

## 2018-11-21 NOTE — Progress Notes (Signed)
 .  Numeric Pain Rating Scale and Functional Assessment Average Pain 0   In the last MONTH (on 0-10 scale) has pain interfered with the following?  1. General activity like being  able to carry out your everyday physical activities such as walking, climbing stairs, carrying groceries, or moving a chair?  Rating(6)

## 2018-11-22 DIAGNOSIS — J42 Unspecified chronic bronchitis: Secondary | ICD-10-CM | POA: Diagnosis not present

## 2018-11-22 DIAGNOSIS — N189 Chronic kidney disease, unspecified: Secondary | ICD-10-CM | POA: Diagnosis not present

## 2018-11-22 DIAGNOSIS — I878 Other specified disorders of veins: Secondary | ICD-10-CM | POA: Diagnosis not present

## 2018-11-22 DIAGNOSIS — L97211 Non-pressure chronic ulcer of right calf limited to breakdown of skin: Secondary | ICD-10-CM | POA: Diagnosis not present

## 2018-11-22 DIAGNOSIS — D509 Iron deficiency anemia, unspecified: Secondary | ICD-10-CM | POA: Diagnosis not present

## 2018-11-22 DIAGNOSIS — I129 Hypertensive chronic kidney disease with stage 1 through stage 4 chronic kidney disease, or unspecified chronic kidney disease: Secondary | ICD-10-CM | POA: Diagnosis not present

## 2018-11-22 NOTE — Progress Notes (Signed)
Alice [name] - 48 y.o. female MRN [id number]  Date of birth: 05-Dec-1970  Office Visit Note: Visit Date: 11/21/2018 PCP: Benito Mccreedy, MD Referred by: Benito Mccreedy, MD  Subjective: Chief Complaint  Patient presents with  . Right Hand - Numbness, Tingling   HPI:  Alice [name] is a 48 y.o. female who comes in today At the request of Shawn [place], PA-C for electrodiagnostic study of the right upper limb.  Patient is right-hand dominant with history of chronic worsening pain numbness and tingling in the right hand particularly in the thumb index middle and ring finger.  She denies any tingling in the fifth digit.  She reports 4 months of worsening despite using Lyrica and heat and other medications.  She denies any left-sided complaints.  Her case is complicated by morbid obesity as well as depression anxiety and history of idiopathic chronic venous hypertension with ulcers.  She has not had prior electrodiagnostic study.  No prior carpal tunnel surgery and she denies radicular complaints.  ROS Otherwise per HPI.  Assessment & Plan: Visit Diagnoses:  1. Paresthesia of skin     Plan: Impression: The above electrodiagnostic study is ABNORMAL and reveals evidence of a moderate to severe right median nerve entrapment at the wrist (carpal tunnel syndrome) affecting sensory and motor components.   There is no significant electrodiagnostic evidence of any other focal nerve entrapment, brachial plexopathy or cervical radiculopathy.   Recommendations: 1.  Follow-up with referring physician. 2.  Continue current management of symptoms. 3.  Continue use of resting splint at night-time and as needed during the day. 4.  Suggest surgical evaluation.   Meds & Orders: No orders of the defined types were placed in this encounter.   Orders Placed This Encounter  Procedures  . NCV with EMG (electromyography)    Follow-up: Return for  Dondra Prader, FN-P.   Procedures: No procedures  performed  EMG & NCV Findings: Evaluation of the right median motor nerve showed prolonged distal onset latency (6.6 ms) and decreased conduction velocity (Elbow-Wrist, 47 m/s).  The right median (across palm) sensory nerve showed no response (Wrist) and prolonged distal peak latency (Palm, 3.8 ms).  All remaining nerves (as indicated in the following tables) were within normal limits.    All examined muscles (as indicated in the following table) showed no evidence of electrical instability.    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   There is no significant electrodiagnostic evidence of any other focal nerve entrapment, brachial plexopathy or cervical radiculopathy.   Recommendations: 1.  Follow-up with referring physician. 2.  Continue current management of symptoms. 3.  Continue use of resting splint at night-time and as needed during the day. 4.  Suggest surgical evaluation.  ___________________________ Laurence Spates FAAPMR Board Certified, American Board of Physical Medicine and Rehabilitation    Nerve Conduction Studies Anti Sensory Summary Table   Stim Site NR Peak (ms) Norm Peak (ms) P-T Amp (V) Norm P-T Amp Site1 Site2 Delta-P (ms) Dist (cm) Vel (m/s) Norm Vel (m/s)  Right Median Acr Palm Anti Sensory (2nd Digit)  31.9C  Wrist *NR  <3.6  >10 Wrist Palm  0.0    Palm    *3.8 <2.0 0.0         Right Radial Anti Sensory (Base 1st Digit)  32.8C  Wrist    2.1 <  3.1 21.5  Wrist Base 1st Digit 2.1 0.0    Right Ulnar Anti Sensory (5th Digit)  32.2C  Wrist    3.3 <3.7 21.9 >15.0 Wrist 5th Digit 3.3 14.0 42 >38   Motor Summary Table   Stim Site NR Onset (ms) Norm Onset (ms) O-P Amp (mV) Norm O-P Amp Site1 Site2 Delta-0 (ms) Dist (cm) Vel (m/s) Norm Vel (m/s)  Right Median Motor (Abd Poll Brev)  33C  Wrist     *6.6 <4.2 5.3 >5 Elbow Wrist 5.3 25.0 *47 >50  Elbow    11.9  4.3         Right Ulnar Motor (Abd Dig Min)  32.9C  Wrist    3.1 <4.2 9.7 >3 B Elbow Wrist 4.1 24.0 59 >53  B Elbow    7.2  9.3  A Elbow B Elbow 1.4 11.0 79 >53  A Elbow    8.6  9.3          EMG   Side Muscle Nerve Root Ins Act Fibs Psw Amp Dur Poly Recrt Int Fraser Din Comment  Right Abd Poll Brev Median C8-T1 Nml Nml Nml Nml Nml 0 Nml Nml   Right 1stDorInt Ulnar C8-T1 Nml Nml Nml Nml Nml 0 Nml Nml   Right PronatorTeres Median C6-7 Nml Nml Nml Nml Nml 0 Nml Nml   Right Biceps Musculocut C5-6 Nml Nml Nml Nml Nml 0 Nml Nml   Right Deltoid Axillary C5-6 Nml Nml Nml Nml Nml 0 Nml Nml     Nerve Conduction Studies Anti Sensory Left/Right Comparison   Stim Site L Lat (ms) R Lat (ms) L-R Lat (ms) L Amp (V) R Amp (V) L-R Amp (%) Site1 Site2 L Vel (m/s) R Vel (m/s) L-R Vel (m/s)  Median Acr Palm Anti Sensory (2nd Digit)  31.9C  Wrist       Wrist Palm     Palm  *3.8   0.0        Radial Anti Sensory (Base 1st Digit)  32.8C  Wrist  2.1   21.5  Wrist Base 1st Digit     Ulnar Anti Sensory (5th Digit)  32.2C  Wrist  3.3   21.9  Wrist 5th Digit  42    Motor Left/Right Comparison   Stim Site L Lat (ms) R Lat (ms) L-R Lat (ms) L Amp (mV) R Amp (mV) L-R Amp (%) Site1 Site2 L Vel (m/s) R Vel (m/s) L-R Vel (m/s)  Median Motor (Abd Poll Brev)  33C  Wrist  *6.6   5.3  Elbow Wrist  *47   Elbow  11.9   4.3        Ulnar Motor (Abd Dig Min)  32.9C  Wrist  3.1   9.7  B Elbow Wrist  59   B Elbow  7.2   9.3  A Elbow B Elbow  79   A Elbow  8.6   9.3           Waveforms:            Clinical History: MRI LUMBAR SPINE WITHOUT CONTRAST  TECHNIQUE: Multiplanar, multisequence MR imaging of the lumbar spine was performed. No intravenous contrast was administered.  COMPARISON:  10/10/2017 lumbar spine radiographs  FINDINGS: Segmentation:  Standard.  Alignment:  L3-4 grade 1 anterolisthesis.  Vertebrae: No fracture, evidence of  discitis, or bone lesion. L4-5 and L5-S1 Modic 2 degenerative changes.  Conus medullaris and cauda equina: Conus extends to the L2 level. Conus and cauda  equina appear normal.  Paraspinal and other soft tissues: Negative.  Disc levels:  L1-2: No significant disc displacement, foraminal stenosis, or canal stenosis.  L2-3: Minimal disc bulge. No significant foraminal or canal stenosis.  L3-4: Grade 1 anterolisthesis with small uncovered disc bulge and left-greater-than-right facet hypertrophy. Moderate bilateral foraminal stenosis. Moderate to severe canal stenosis. Small bilateral facet effusions.  L4-5: Diffuse disc bulge with endplate marginal osteophytes, small central disc protrusion, moderate bilateral facet hypertrophy, and ligamentum flavum hypertrophy. Moderate bilateral foraminal stenosis. Moderate canal stenosis. The central protrusion narrows the lateral recesses with contact on descending L5 nerve roots.  L5-S1: Diffuse disc bulge and endplate marginal osteophytes with mild bilateral facet hypertrophy. Moderate bilateral foraminal stenosis. No significant canal stenosis.  IMPRESSION: 1. L3-4 grade 1 anterolisthesis with prominent facet hypertrophy. No acute osseous abnormality. 2. Age advanced lumbar spondylosis predominantly at the L3-4 through L5-S1 levels. 3. L3-4 moderate to severe and L4-5 moderate multifactorial canal stenosis. 4. Moderate bilateral foraminal stenosis at the L3-4, L4-5, and L5-S1 levels.   Electronically Signed   By: Kristine Garbe M.D.   On: 11/15/2017 04:38     Objective:  VS:  HT:    WT:   BMI:     BP:   HR: bpm  TEMP: ( )  RESP:  Physical Exam Musculoskeletal:        General: No swelling, tenderness or deformity.     Comments: Inspection reveals flattening of the right APB but no atrophy of the bilateral APB or FDI or hand intrinsics. There is no swelling, color changes, allodynia or dystrophic  changes. There is 5 out of 5 strength in the bilateral wrist extension, finger abduction and long finger flexion. There is intact sensation to light touch in all dermatomal and peripheral nerve distributions.  There is a negative Hoffmann's test bilaterally.  Skin:    General: Skin is warm and dry.     Findings: No erythema or rash.  Neurological:     General: No focal deficit present.     Mental Status: She is alert and oriented to person, place, and time.     Motor: No weakness or abnormal muscle tone.     Coordination: Coordination normal.  Psychiatric:        Mood and Affect: Mood normal.        Behavior: Behavior normal.     Ortho Exam Imaging: No results found.

## 2018-11-22 NOTE — Procedures (Signed)
EMG & NCV Findings: Evaluation of the right median motor nerve showed prolonged distal onset latency (6.6 ms) and decreased conduction velocity (Elbow-Wrist, 47 m/s).  The right median (across palm) sensory nerve showed no response (Wrist) and prolonged distal peak latency (Palm, 3.8 ms).  All remaining nerves (as indicated in the following tables) were within normal limits.    All examined muscles (as indicated in the following table) showed no evidence of electrical instability.    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   There is no significant electrodiagnostic evidence of any other focal nerve entrapment, brachial plexopathy or cervical radiculopathy.   Recommendations: 1.  Follow-up with referring physician. 2.  Continue current management of symptoms. 3.  Continue use of resting splint at night-time and as needed during the day. 4.  Suggest surgical evaluation.  ___________________________ Laurence Spates FAAPMR Board Certified, American Board of Physical Medicine and Rehabilitation    Nerve Conduction Studies Anti Sensory Summary Table   Stim Site NR Peak (ms) Norm Peak (ms) P-T Amp (V) Norm P-T Amp Site1 Site2 Delta-P (ms) Dist (cm) Vel (m/s) Norm Vel (m/s)  Right Median Acr Palm Anti Sensory (2nd Digit)  31.9C  Wrist *NR  <3.6  >10 Wrist Palm  0.0    Palm    *3.8 <2.0 0.0         Right Radial Anti Sensory (Base 1st Digit)  32.8C  Wrist    2.1 <3.1 21.5  Wrist Base 1st Digit 2.1 0.0    Right Ulnar Anti Sensory (5th Digit)  32.2C  Wrist    3.3 <3.7 21.9 >15.0 Wrist 5th Digit 3.3 14.0 42 >38   Motor Summary Table   Stim Site NR Onset (ms) Norm Onset (ms) O-P Amp (mV) Norm O-P Amp Site1 Site2 Delta-0 (ms) Dist (cm) Vel (m/s) Norm Vel (m/s)  Right Median Motor (Abd Poll Brev)  33C  Wrist    *6.6 <4.2 5.3  >5 Elbow Wrist 5.3 25.0 *47 >50  Elbow    11.9  4.3         Right Ulnar Motor (Abd Dig Min)  32.9C  Wrist    3.1 <4.2 9.7 >3 B Elbow Wrist 4.1 24.0 59 >53  B Elbow    7.2  9.3  A Elbow B Elbow 1.4 11.0 79 >53  A Elbow    8.6  9.3          EMG   Side Muscle Nerve Root Ins Act Fibs Psw Amp Dur Poly Recrt Int Fraser Din Comment  Right Abd Poll Brev Median C8-T1 Nml Nml Nml Nml Nml 0 Nml Nml   Right 1stDorInt Ulnar C8-T1 Nml Nml Nml Nml Nml 0 Nml Nml   Right PronatorTeres Median C6-7 Nml Nml Nml Nml Nml 0 Nml Nml   Right Biceps Musculocut C5-6 Nml Nml Nml Nml Nml 0 Nml Nml   Right Deltoid Axillary C5-6 Nml Nml Nml Nml Nml 0 Nml Nml     Nerve Conduction Studies Anti Sensory Left/Right Comparison   Stim Site L Lat (ms) R Lat (ms) L-R Lat (ms) L Amp (V) R Amp (V) L-R Amp (%) Site1 Site2 L Vel (m/s) R Vel (m/s) L-R Vel (m/s)  Median Acr Palm Anti Sensory (2nd Digit)  31.9C  Wrist       Wrist Palm     Palm  *3.8  0.0        Radial Anti Sensory (Base 1st Digit)  32.8C  Wrist  2.1   21.5  Wrist Base 1st Digit     Ulnar Anti Sensory (5th Digit)  32.2C  Wrist  3.3   21.9  Wrist 5th Digit  42    Motor Left/Right Comparison   Stim Site L Lat (ms) R Lat (ms) L-R Lat (ms) L Amp (mV) R Amp (mV) L-R Amp (%) Site1 Site2 L Vel (m/s) R Vel (m/s) L-R Vel (m/s)  Median Motor (Abd Poll Brev)  33C  Wrist  *6.6   5.3  Elbow Wrist  *47   Elbow  11.9   4.3        Ulnar Motor (Abd Dig Min)  32.9C  Wrist  3.1   9.7  B Elbow Wrist  59   B Elbow  7.2   9.3  A Elbow B Elbow  79   A Elbow  8.6   9.3           Waveforms:

## 2018-11-27 ENCOUNTER — Ambulatory Visit (INDEPENDENT_AMBULATORY_CARE_PROVIDER_SITE_OTHER): Payer: Medicare Other | Admitting: Family

## 2018-11-27 ENCOUNTER — Encounter: Payer: Self-pay | Admitting: Family

## 2018-11-27 ENCOUNTER — Other Ambulatory Visit: Payer: Self-pay

## 2018-11-27 DIAGNOSIS — I87331 Chronic venous hypertension (idiopathic) with ulcer and inflammation of right lower extremity: Secondary | ICD-10-CM

## 2018-11-27 DIAGNOSIS — L97913 Non-pressure chronic ulcer of unspecified part of right lower leg with necrosis of muscle: Secondary | ICD-10-CM

## 2018-11-27 DIAGNOSIS — Z945 Skin transplant status: Secondary | ICD-10-CM

## 2018-11-27 DIAGNOSIS — L97919 Non-pressure chronic ulcer of unspecified part of right lower leg with unspecified severity: Secondary | ICD-10-CM

## 2018-11-27 MED ORDER — OXYCODONE HCL 10 MG PO TABS: 10.0000 mg | ORAL_TABLET | Freq: Four times a day (QID) | ORAL | 0 refills | Status: DC | PRN

## 2018-11-28 ENCOUNTER — Encounter: Payer: Self-pay | Admitting: Family

## 2018-11-28 DIAGNOSIS — J42 Unspecified chronic bronchitis: Secondary | ICD-10-CM | POA: Diagnosis not present

## 2018-11-28 DIAGNOSIS — N189 Chronic kidney disease, unspecified: Secondary | ICD-10-CM | POA: Diagnosis not present

## 2018-11-28 DIAGNOSIS — L97211 Non-pressure chronic ulcer of right calf limited to breakdown of skin: Secondary | ICD-10-CM | POA: Diagnosis not present

## 2018-11-28 DIAGNOSIS — I129 Hypertensive chronic kidney disease with stage 1 through stage 4 chronic kidney disease, or unspecified chronic kidney disease: Secondary | ICD-10-CM | POA: Diagnosis not present

## 2018-11-28 DIAGNOSIS — D509 Iron deficiency anemia, unspecified: Secondary | ICD-10-CM | POA: Diagnosis not present

## 2018-11-28 DIAGNOSIS — I878 Other specified disorders of veins: Secondary | ICD-10-CM | POA: Diagnosis not present

## 2018-11-28 NOTE — Progress Notes (Signed)
Office Visit Note   Patient: Alice [name]           Date of Birth: 1971-01-17           MRN: [id number] Visit Date: 11/27/2018              Requested by: Benito Mccreedy, MD [address],  [place] [id number] PCP: Benito Mccreedy, MD  Chief Complaint  Patient presents with  . Right Leg - Follow-up      HPI: The patient is a 48 year old woman who is seen for postoperative follow-up following debridement of her right lower extremity ulcer and placement of a split-thickness skin graft on 09/28/2018.  She has been undergoing Dynaflex multilayer compression wrap dressing changes twice a week.  Once in office and once at home by her.  She is pleased with the progress she has made since last visit.  Assessment & Plan: Visit Diagnoses:  1. Chronic ulcer of leg, right, with necrosis of muscle ([place])   2. Hx of skin graft   3. 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 ([place])     Plan: We will continue Dynaflex wraps for compression with Vaseline gauze over the graft area.  Was provided with supplies so she can do her own dressings for 2 weeks.  She will follow-up in the office in 2 weeks.  Sooner should she have any concerns.  Did refill her pain medication.  Follow-Up Instructions: Return in about 2 weeks (around 12/11/2018).   Ortho Exam  Patient is alert, oriented, no adenopathy, well-dressed, normal affect, normal respiratory effort. Excellent epithelialization to the wound borders.  Much improved since last viewing.  There is granulation and 90% of the wound bed.  Thin exudative tissue..  She has scant serosanguinous drainage from the wound.  There is no peri-wound irritation or signs of infection or cellulitis.  Imaging: No results found.   Labs: Lab Results  Component Value Date   REPTSTATUS 10/06/2018 FINAL 10/05/2018   GRAMSTAIN  09/28/2018    FEW WBC PRESENT, PREDOMINANTLY PMN FEW GRAM POSITIVE  COCCI IN PAIRS FEW GRAM POSITIVE RODS    CULT 20,000 COLONIES/mL YEAST (A) 10/05/2018   LABORGA PROTEUS MIRABILIS 09/28/2018   LABORGA STAPHYLOCOCCUS CAPRAE 09/28/2018   LABORGA SERRATIA MARCESCENS 09/28/2018     Lab Results  Component Value Date   ALBUMIN 2.5 (L) 10/08/2018   ALBUMIN 3.2 (L) 01/29/2018   ALBUMIN 2.2 (L) 01/03/2017    There is no height or weight on file to calculate BMI.  Orders:  No orders of the defined types were placed in this encounter.  Meds ordered this encounter  Medications  . Oxycodone HCl 10 MG TABS    Sig: Take 1 tablet (10 mg total) by mouth 4 (four) times daily as needed.    Dispense:  28 tablet    Refill:  0     Procedures: No procedures performed  Clinical Data: No additional findings.  ROS:  All other systems negative, except as noted in the HPI. Review of Systems  Constitutional: Negative for chills and fever.  Cardiovascular: Negative for leg swelling.  Skin: Positive for wound. Negative for color change.    Objective: Vital Signs: There were no vitals taken for this visit.  Specialty Comments:  No specialty comments available.  PMFS History: Patient Active Problem List   Diagnosis Date Noted  . Subtherapeutic international normalized ratio (INR)   . Postoperative pain   .  Hypoalbuminemia   . Acute on chronic kidney failure ([place])   . Acute blood loss anemia   . Benign essential HTN   . Debility 10/05/2018  . Chronic ulcer of right leg, with fat layer exposed ([place]) 09/28/2018  . Chronic ulcer of leg, right, with necrosis of muscle ([place])   . Iron deficiency anemia 01/29/2018  . Pulmonary embolus ([place]) 01/09/2018  . Encounter for therapeutic drug monitoring 01/09/2018  . Severe malnutrition ([place]) 08/24/2017  . Acute kidney injury superimposed on CKD ([place]) 12/28/2016  . Hx of skin graft 08/01/2016  . 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 ([place]) 07/25/2016  . Idiopathic chronic venous hypertension  of right lower extremity with ulcer and inflammation ([place]) 07/05/2016  . Trichomonal infection 11/24/2015  . Abdominal pain 05/02/2015  . Symptomatic cholelithiasis 04/16/2015  . Acute bilateral upper abdominal pain 04/16/2015  . Microcytic anemia 05/18/2013  . Hypotension, unspecified 05/17/2013  . CKD (chronic kidney disease), stage III ([place]) 05/17/2013  . Hypokalemia 05/17/2013  . Anal fissure 02/06/2013  . Anal skin tag 02/06/2013  . ALLERGIC RHINITIS 03/05/2010  . LOW BACK PAIN SYNDROME 12/13/2007  . Local infection of skin and subcutaneous tissue 10/10/2007  . OBESITY, MORBID 12/02/2006  . HTN (hypertension) 12/02/2006  . History of pulmonary embolism 12/02/2006  . History of DVT (deep vein thrombosis) 12/02/2006  . 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 GASTROESOPHAGEAL REFLUX DISEASE 12/02/2006  . OSA (obstructive sleep apnea) 12/02/2006   Past Medical History:  Diagnosis Date  . Anemia   . Anxiety   . Arthritis   . Chronic bronchitis ([place])   . Chronic upper back pain   . Depression   . DVT (deep venous thrombosis) ([facility])    BLE  . GERD (gastroesophageal reflux disease)   . Headache    "weekly" (04/16/2015)  . Hypertension   . Migraine    "monthly" (04/16/2015)  . Morbid obesity with BMI of 50.0-59.9, adult ([place])   . Obesity   . Pneumonia 2014  . Pulmonary embolism ([place])   . Sinusitis nasal   . Sleep apnea    "I'm suppose to wear a mask but I don't" (04/16/2015)  . Varicose veins of right lower extremity   . Venous stasis of lower extremity    right  . Wears dentures   . Wears glasses     Family History  Problem Relation Age of Onset  . Kidney disease Mother        kidney transplant    Past Surgical History:  Procedure Laterality Date  . CHOLECYSTECTOMY N/A 04/18/2015   Procedure: LAPAROSCOPIC CHOLECYSTECTOMY;  Surgeon: Ralene Ok, MD;  Location: [facility];  Service: General;  Laterality: N/A;  . HYSTEROSCOPY W/D&C  12/24/2001   Archie Endo  09/28/2010  . I&D EXTREMITY Right 07/25/2016   Procedure: IRRIGATION AND DEBRIDEMENT RIGHT LEG ULCER, APPLY VERAFLO VAC;  Surgeon: [name], MD;  Location: [facility];  Service: Orthopedics;  Laterality: Right;  . I&D EXTREMITY Right 09/28/2018   Procedure: DEBRIDEMENT RIGHT LOWER LEG WITH PLACEMENT WITH PLACEMENT OF SKIN GRAFT AND VAC;  Surgeon: [name], MD;  Location: [facility];  Service: Orthopedics;  Laterality: Right;  . I&D EXTREMITY Right 10/03/2018   Procedure: REPEAT IRRIGATION AND DEBRIDEMENT RIGHT LOWER LEG, VAC PLACEMENT;  Surgeon: [name], MD;  Location: [facility];  Service: Orthopedics;  Laterality: Right;  . INCISE AND DRAIN ABCESS Right 07/14/2016  . INCISION AND DRAINAGE Right 09/10/2008  leg:  skin and soft tissue and muscle/notes 09/15/2010  . INCISION AND DRAINAGE Right 01/01/2008   Chronic venous stasis insufficiency ulcer,/notes 09/14/2010/  . INCISION AND DRAINAGE Right 09/91   calf w/application wound vac/notes 07/20/2010  . INCISION AND DRAINAGE Right 07/25/2016   IRRIGATION AND DEBRIDEMENT RIGHT LEG ULCER,  . LAPAROSCOPIC GASTRIC BYPASS  ~ 2007  . MULTIPLE TOOTH EXTRACTIONS    . SKIN GRAFT SPLIT THICKNESS LEG / FOOT Right 07/25/2016   LEG  . SKIN SPLIT GRAFT Right 07/27/2016   Procedure: SKIN GRAFT RIGHT LEG WITH THERASKIN APPLICATION;  Surgeon: [name], MD;  Location: [facility];  Service: Orthopedics;  Laterality: Right;  . TONSILLECTOMY     Social History   Occupational History  . Not on file  Tobacco Use  . Smoking status: Never Smoker  . Smokeless tobacco: Never Used  Substance and Sexual Activity  . Alcohol use: No  . Drug use: No  . Sexual activity: Yes    Partners: Male    Birth control/protection: I.U.D.

## 2018-12-03 DIAGNOSIS — R7303 Prediabetes: Secondary | ICD-10-CM | POA: Diagnosis not present

## 2018-12-03 DIAGNOSIS — E559 Vitamin D deficiency, unspecified: Secondary | ICD-10-CM | POA: Diagnosis not present

## 2018-12-03 DIAGNOSIS — I10 Essential (primary) hypertension: Secondary | ICD-10-CM | POA: Diagnosis not present

## 2018-12-03 DIAGNOSIS — G629 Polyneuropathy, unspecified: Secondary | ICD-10-CM | POA: Diagnosis not present

## 2018-12-03 DIAGNOSIS — R1013 Epigastric pain: Secondary | ICD-10-CM | POA: Diagnosis not present

## 2018-12-03 DIAGNOSIS — G4733 Obstructive sleep apnea (adult) (pediatric): Secondary | ICD-10-CM | POA: Diagnosis not present

## 2018-12-03 DIAGNOSIS — F329 Major depressive disorder, single episode, unspecified: Secondary | ICD-10-CM | POA: Diagnosis not present

## 2018-12-04 ENCOUNTER — Other Ambulatory Visit: Payer: Self-pay | Admitting: Family

## 2018-12-04 DIAGNOSIS — Z945 Skin transplant status: Secondary | ICD-10-CM

## 2018-12-04 DIAGNOSIS — L97919 Non-pressure chronic ulcer of unspecified part of right lower leg with unspecified severity: Secondary | ICD-10-CM

## 2018-12-04 DIAGNOSIS — I87331 Chronic venous hypertension (idiopathic) with ulcer and inflammation of right lower extremity: Secondary | ICD-10-CM

## 2018-12-04 MED ORDER — OXYCODONE HCL 10 MG PO TABS: 10.0000 mg | ORAL_TABLET | Freq: Four times a day (QID) | ORAL | 0 refills | Status: DC | PRN

## 2018-12-06 DIAGNOSIS — I878 Other specified disorders of veins: Secondary | ICD-10-CM | POA: Diagnosis not present

## 2018-12-06 DIAGNOSIS — L97211 Non-pressure chronic ulcer of right calf limited to breakdown of skin: Secondary | ICD-10-CM | POA: Diagnosis not present

## 2018-12-06 DIAGNOSIS — D509 Iron deficiency anemia, unspecified: Secondary | ICD-10-CM | POA: Diagnosis not present

## 2018-12-06 DIAGNOSIS — J42 Unspecified chronic bronchitis: Secondary | ICD-10-CM | POA: Diagnosis not present

## 2018-12-06 DIAGNOSIS — N189 Chronic kidney disease, unspecified: Secondary | ICD-10-CM | POA: Diagnosis not present

## 2018-12-06 DIAGNOSIS — I129 Hypertensive chronic kidney disease with stage 1 through stage 4 chronic kidney disease, or unspecified chronic kidney disease: Secondary | ICD-10-CM | POA: Diagnosis not present

## 2018-12-10 ENCOUNTER — Other Ambulatory Visit: Payer: Self-pay

## 2018-12-10 ENCOUNTER — Encounter: Payer: Self-pay | Admitting: Physical Medicine & Rehabilitation

## 2018-12-10 ENCOUNTER — Encounter: Payer: Medicare Other | Attending: Physical Medicine & Rehabilitation | Admitting: Physical Medicine & Rehabilitation

## 2018-12-10 VITALS — BP 115/74 | HR 85 | Temp 97.5°F | Ht 66.0 in | Wt 304.6 lb

## 2018-12-10 DIAGNOSIS — R269 Unspecified abnormalities of gait and mobility: Secondary | ICD-10-CM | POA: Diagnosis not present

## 2018-12-10 DIAGNOSIS — I10 Essential (primary) hypertension: Secondary | ICD-10-CM | POA: Insufficient documentation

## 2018-12-10 DIAGNOSIS — Z9114 Patient's other noncompliance with medication regimen: Secondary | ICD-10-CM

## 2018-12-10 DIAGNOSIS — G8918 Other acute postprocedural pain: Secondary | ICD-10-CM | POA: Insufficient documentation

## 2018-12-10 DIAGNOSIS — R5381 Other malaise: Secondary | ICD-10-CM

## 2018-12-10 DIAGNOSIS — D62 Acute posthemorrhagic anemia: Secondary | ICD-10-CM | POA: Diagnosis not present

## 2018-12-10 DIAGNOSIS — D508 Other iron deficiency anemias: Secondary | ICD-10-CM | POA: Diagnosis not present

## 2018-12-10 DIAGNOSIS — G4733 Obstructive sleep apnea (adult) (pediatric): Secondary | ICD-10-CM | POA: Diagnosis not present

## 2018-12-10 DIAGNOSIS — L97913 Non-pressure chronic ulcer of unspecified part of right lower leg with necrosis of muscle: Secondary | ICD-10-CM | POA: Insufficient documentation

## 2018-12-10 NOTE — Progress Notes (Signed)
Subjective:    Patient ID: Alice [name], female    DOB: [birth year], 48 y.o.   MRN: [id number]  HPI Female with history of HTN, obesity RLE ulcers, presents for follow for debility.    Last clinic visit on 10/25/2018, seen by NP, notes reviewed.  Since that time,  She states she been elevating her leg. She has is having her PCP check blood work. She continues to use Mepitel. She sees Ortho tomorrow.  Denies falls. She is in therapies 1/week. BP is controlled.    Pain Inventory Average Pain 10 Pain Right Now 10 My pain is sharp, stabbing and aching  In the last 24 hours, has pain interfered with the following? General activity 0 Relation with others 4 Enjoyment of life 0 What TIME of day is your pain at its worst? morning daytime and night Sleep (in general) Fair  Pain is worse with: walking, bending, sitting and standing Pain improves with: rest, heat/ice, therapy/exercise and medication Relief from Meds: 8  Mobility walk with assistance use a walker how many minutes can you walk? 1 ability to climb steps?  no do you drive?  no  Function Do you have any goals in this area?  yes  Neuro/Psych weakness trouble walking dizziness  Prior Studies Any changes since last visit?  no  Physicians involved in your care Any changes since last visit?  no   Family History  Problem Relation Age of Onset  . Kidney disease Mother        kidney transplant   Social History   Socioeconomic History  . Marital status: Single    Spouse name: Not on file  . Number of children: Not on file  . Years of education: Not on file  . Highest education level: Not on file  Occupational History  . Not on file  Social Needs  . Financial resource strain: Not on file  . Food insecurity    Worry: Not on file    Inability: Not on file  . Transportation needs    Medical: Not on file    Non-medical: Not on file  Tobacco Use  . Smoking status: Never Smoker  . Smokeless tobacco: Never Used   Substance and Sexual Activity  . Alcohol use: No  . Drug use: No  . Sexual activity: Yes    Partners: Male    Birth control/protection: I.U.D.  Lifestyle  . Physical activity    Days per week: Not on file    Minutes per session: Not on file  . Stress: Not on file  Relationships  . Social Herbalist on phone: Not on file    Gets together: Not on file    Attends religious service: Not on file    Active member of club or organization: Not on file    Attends meetings of clubs or organizations: Not on file    Relationship status: Not on file  Other Topics Concern  . Not on file  Social History Narrative  . Not on file   Past Surgical History:  Procedure Laterality Date  . CHOLECYSTECTOMY N/A 04/18/2015   Procedure: LAPAROSCOPIC CHOLECYSTECTOMY;  Surgeon: Ralene Ok, MD;  Location: [facility];  Service: General;  Laterality: N/A;  . HYSTEROSCOPY W/D&C  12/24/2001   Archie Endo 09/28/2010  . I&D EXTREMITY Right 07/25/2016   Procedure: IRRIGATION AND DEBRIDEMENT RIGHT LEG ULCER, APPLY VERAFLO VAC;  Surgeon: [name], MD;  Location: [facility];  Service: Orthopedics;  Laterality:  Right;  . I&D EXTREMITY Right 09/28/2018   Procedure: DEBRIDEMENT RIGHT LOWER LEG WITH PLACEMENT WITH PLACEMENT OF SKIN GRAFT AND VAC;  Surgeon: [name], MD;  Location: [facility];  Service: Orthopedics;  Laterality: Right;  . I&D EXTREMITY Right 10/03/2018   Procedure: REPEAT IRRIGATION AND DEBRIDEMENT RIGHT LOWER LEG, VAC PLACEMENT;  Surgeon: [name], MD;  Location: [facility];  Service: Orthopedics;  Laterality: Right;  . INCISE AND DRAIN ABCESS Right 07/14/2016  . INCISION AND DRAINAGE Right 09/10/2008   leg:  skin and soft tissue and muscle/notes 09/15/2010  . INCISION AND DRAINAGE Right 01/01/2008   Chronic venous stasis insufficiency ulcer,/notes 09/14/2010/  . INCISION AND DRAINAGE Right 10/6597   calf w/application wound vac/notes 07/20/2010  . INCISION AND DRAINAGE Right 07/25/2016   IRRIGATION AND  DEBRIDEMENT RIGHT LEG ULCER,  . LAPAROSCOPIC GASTRIC BYPASS  ~ 2007  . MULTIPLE TOOTH EXTRACTIONS    . SKIN GRAFT SPLIT THICKNESS LEG / FOOT Right 07/25/2016   LEG  . SKIN SPLIT GRAFT Right 07/27/2016   Procedure: SKIN GRAFT RIGHT LEG WITH THERASKIN APPLICATION;  Surgeon: [name], MD;  Location: [facility];  Service: Orthopedics;  Laterality: Right;  . TONSILLECTOMY     Past Medical History:  Diagnosis Date  . Anemia   . Anxiety   . Arthritis   . Chronic bronchitis ([place])   . Chronic upper back pain   . Depression   . DVT (deep venous thrombosis) ([facility])    BLE  . GERD (gastroesophageal reflux disease)   . Headache    "weekly" (04/16/2015)  . Hypertension   . Migraine    "monthly" (04/16/2015)  . Morbid obesity with BMI of 50.0-59.9, adult ([place])   . Obesity   . Pneumonia 2014  . Pulmonary embolism ([place])   . Sinusitis nasal   . Sleep apnea    "I'm suppose to wear a mask but I don't" (04/16/2015)  . Varicose veins of right lower extremity   . Venous stasis of lower extremity    right  . Wears dentures   . Wears glasses    BP 115/74   Pulse 85   Temp (!) 97.5 F (36.4 C)   Ht 5\' 6"  (1.676 m)   Wt (!) 304 lb 9.6 oz (138.2 kg)   SpO2 98%   BMI 49.16 kg/m   Opioid Risk Score:   Fall Risk Score:  `1  Depression screen PHQ 2/9  Depression screen [facility] 2/9 12/10/2018 10/25/2018  Decreased Interest 0 0  Down, Depressed, Hopeless 0 0  PHQ - 2 Score 0 0  Altered sleeping - 1  Tired, decreased energy - 1  Change in appetite - 1  Feeling bad or failure about yourself  - 1  Trouble concentrating - 1  Moving slowly or fidgety/restless - 1  Suicidal thoughts - 2  PHQ-9 Score - 8  Difficult doing work/chores - Not difficult at all  Some recent data might be hidden    Review of Systems  Constitutional: Negative.   HENT: Negative.   Eyes: Negative.   Respiratory: Negative.   Cardiovascular: Negative.   Gastrointestinal: Negative.   Endocrine: Negative.   Genitourinary:  Negative.   Musculoskeletal: Positive for gait problem.  Skin: Negative.   Allergic/Immunologic: Negative.   Neurological: Positive for dizziness and weakness.  Hematological: Negative.   Psychiatric/Behavioral: Positive for dysphoric mood.  All other systems reviewed and are negative.      Objective:   Physical Exam  Constitutional: No distress . Vital signs reviewed. Obese HENT: Normocephalic.  Atraumatic. Eyes: EOMI. No discharge. Cardiovascular: No JVD. Respiratory: Normal effort. GI: Non-distended. Musc: RLE with edema and tenderness, improving Gait: Antalgic Neurological: She is alert and oriented Motor: B/l UE 5/5.  RLE: Hip flexion, knee extension 4-/5, ankle dorsiflexion 3+/5, some pain inhibition  LLE: 4+-5/5 proximal to distal Skin: RLE with dressing C/D/I. Psychiatric: She has a normal mood and affect. Her behavior is normal. Judgment and thought content normal.     Assessment & Plan:  Female with history of HTN, obesity RLE ulcers, presents for follow for debility.    1. Functional deficits secondary to debility related to right leg wound with infection and associated complications             Cont therapies  Cont follow up with Ortho  2.  H/o DVT/Antithrombotics:   Transitioned to Eliquis  3. Pain Management:   Meds per Ortho  4. HTN:   Cont meds  Controlled at present  5. Morbid obesity:   Encouraged weight loss  6. OSA:   Noncompliant with CPAP, encouraged compliance  7. Gait abnormality  Cont walker for safety  Cont therapies

## 2018-12-11 ENCOUNTER — Other Ambulatory Visit: Payer: Self-pay | Admitting: Family

## 2018-12-11 ENCOUNTER — Encounter: Payer: Self-pay | Admitting: Family

## 2018-12-11 ENCOUNTER — Ambulatory Visit (INDEPENDENT_AMBULATORY_CARE_PROVIDER_SITE_OTHER): Payer: Medicare Other | Admitting: Family

## 2018-12-11 DIAGNOSIS — L97919 Non-pressure chronic ulcer of unspecified part of right lower leg with unspecified severity: Secondary | ICD-10-CM

## 2018-12-11 DIAGNOSIS — I87331 Chronic venous hypertension (idiopathic) with ulcer and inflammation of right lower extremity: Secondary | ICD-10-CM

## 2018-12-11 DIAGNOSIS — Z945 Skin transplant status: Secondary | ICD-10-CM | POA: Diagnosis not present

## 2018-12-11 MED ORDER — OXYCODONE HCL 10 MG PO TABS: 10.0000 mg | ORAL_TABLET | Freq: Four times a day (QID) | ORAL | 0 refills | Status: DC | PRN

## 2018-12-11 NOTE — Progress Notes (Signed)
Office Visit Note   Patient: Alice [name]           Date of Birth: 1971-04-21           MRN: [id number] Visit Date: 12/11/2018              Requested by: Benito Mccreedy, MD [facility] [address],  [place] [address] PCP: Benito Mccreedy, MD  Chief Complaint  Patient presents with  . Right Leg - Follow-up      HPI: The patient is a 48 year old woman seen today in follow-up for large ulceration to her right lower extremity.  She is status post skin grafting in May of this year.  She has been undergoing twice weekly Dynaflex compression wrap changes.  She and I are quite pleased with the sudden improvement in her ulcer.  Assessment & Plan: Visit Diagnoses:  1. Hx of skin graft   2. 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  She will do her own dressing changes for the next 3 cycles and follow-up in the office in 2 weeks.  Did refill her pain medication.  Follow-Up Instructions: Return in about 2 weeks (around 12/25/2018).   Ortho Exam  Patient is alert, oriented, no adenopathy, well-dressed, normal affect, normal respiratory effort.  On examination of the right lower extremity there is further epithelialization to the wound edges.  See the attached image of her wound.  There is 80% granulation 20% dried fibrinous tissue there is no active drainage no erythema no rash no sign of infection   Imaging: No results found.   Labs: Lab Results  Component Value Date   REPTSTATUS 10/06/2018 FINAL 10/05/2018   GRAMSTAIN  09/28/2018    FEW WBC PRESENT, PREDOMINANTLY PMN FEW GRAM POSITIVE COCCI IN PAIRS FEW GRAM POSITIVE RODS    CULT 20,000 COLONIES/mL YEAST (A) 10/05/2018   LABORGA PROTEUS MIRABILIS 09/28/2018   LABORGA STAPHYLOCOCCUS CAPRAE 09/28/2018   LABORGA SERRATIA MARCESCENS 09/28/2018     Lab Results  Component Value Date   ALBUMIN 2.5 (L) 10/08/2018   ALBUMIN 3.2 (L) 01/29/2018   ALBUMIN 2.2 (L) 01/03/2017    Body mass index is 49.07 kg/m.  Orders:  No orders of the defined types were placed in this encounter.  No orders of the defined types were placed in this encounter.    Procedures: No procedures performed  Clinical Data: No additional findings.  ROS:  All other systems negative, except as noted in the HPI. Review of Systems  Constitutional: Negative for chills and fever.  Cardiovascular: Negative for leg swelling.  Skin: Positive for wound. Negative for color change.    Objective: Vital Signs: Ht 5\' 6"  (1.676 m)   Wt (!) 304 lb (137.9 kg)   BMI 49.07 kg/m   Specialty Comments:  No specialty comments available.  PMFS History: Patient Active Problem List   Diagnosis Date Noted  . Subtherapeutic international normalized ratio (INR)   . Postoperative pain   . Hypoalbuminemia   . Acute on chronic kidney failure ([place])   . Acute blood loss anemia   . Benign essential HTN   . Debility 10/05/2018  . Chronic ulcer of right leg, with fat layer exposed ([place]) 09/28/2018  . Chronic ulcer of leg, right, with necrosis of muscle ([place])   .  Iron deficiency anemia 01/29/2018  . Pulmonary embolus ([place]) 01/09/2018  . Encounter for therapeutic drug monitoring 01/09/2018  . Severe malnutrition ([place]) 08/24/2017  . Acute kidney injury superimposed on CKD ([place]) 12/28/2016  . Hx of skin graft 08/01/2016  . 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 ([place]) 07/25/2016  . 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 Trichomonal infection 11/24/2015  . Abdominal pain 05/02/2015  . Symptomatic cholelithiasis 04/16/2015  . Acute bilateral upper abdominal pain 04/16/2015  . Microcytic anemia 05/18/2013  . Hypotension, unspecified  05/17/2013  . CKD (chronic kidney disease), stage III ([place]) 05/17/2013  . Hypokalemia 05/17/2013  . Anal fissure 02/06/2013  . Anal skin tag 02/06/2013  . ALLERGIC RHINITIS 03/05/2010  . LOW BACK PAIN SYNDROME 12/13/2007  . OBESITY, MORBID 12/02/2006  . HTN (hypertension) 12/02/2006  . History of pulmonary embolism 12/02/2006  . History of DVT (deep vein thrombosis) 12/02/2006  . 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 GASTROESOPHAGEAL REFLUX DISEASE 12/02/2006  . OSA (obstructive sleep apnea) 12/02/2006   Past Medical History:  Diagnosis Date  . Anemia   . Anxiety   . Arthritis   . Chronic bronchitis ([place])   . Chronic upper back pain   . Depression   . DVT (deep venous thrombosis) (HCC)    BLE  . GERD (gastroesophageal reflux disease)   . Headache    "weekly" (04/16/2015)  . Hypertension   . Migraine    "monthly" (04/16/2015)  . Morbid obesity with BMI of 50.0-59.9, adult ([place])   . Obesity   . Pneumonia 2014  . Pulmonary embolism ([place])   . Sinusitis nasal   . Sleep apnea    "I'm suppose to wear a mask but I don't" (04/16/2015)  . Varicose veins of right lower extremity   . Venous stasis of lower extremity    right  . Wears dentures   . Wears glasses     Family History  Problem Relation Age of Onset  . Kidney disease Mother        kidney transplant    Past Surgical History:  Procedure Laterality Date  . CHOLECYSTECTOMY N/A 04/18/2015   Procedure: LAPAROSCOPIC CHOLECYSTECTOMY;  Surgeon: Ralene Ok, MD;  Location: [facility];  Service: General;  Laterality: N/A;  . HYSTEROSCOPY W/D&C  12/24/2001   Archie Endo 09/28/2010  . I&D EXTREMITY Right 07/25/2016   Procedure: IRRIGATION AND DEBRIDEMENT RIGHT LEG ULCER, APPLY VERAFLO VAC;  Surgeon: [name], MD;  Location: [facility];  Service: Orthopedics;  Laterality: Right;  . I&D EXTREMITY Right 09/28/2018   Procedure: DEBRIDEMENT RIGHT LOWER LEG WITH PLACEMENT WITH PLACEMENT OF SKIN GRAFT AND VAC;  Surgeon: [name], MD;  Location: [facility];  Service: Orthopedics;  Laterality: Right;  . I&D EXTREMITY Right 10/03/2018   Procedure: REPEAT IRRIGATION AND DEBRIDEMENT RIGHT LOWER LEG, VAC PLACEMENT;  Surgeon: [name], MD;  Location: [facility];  Service: Orthopedics;  Laterality: Right;  . INCISE AND DRAIN ABCESS Right 07/14/2016  . INCISION AND DRAINAGE Right 09/10/2008   leg:  skin and soft tissue and muscle/notes 09/15/2010  . INCISION AND DRAINAGE Right 01/01/2008   Chronic venous stasis insufficiency ulcer,/notes 09/14/2010/  . INCISION AND DRAINAGE Right 09/4648   calf w/application wound vac/notes 07/20/2010  . INCISION AND DRAINAGE Right 07/25/2016   IRRIGATION AND DEBRIDEMENT RIGHT LEG ULCER,  . LAPAROSCOPIC GASTRIC BYPASS  ~ 2007  . MULTIPLE TOOTH EXTRACTIONS    .  SKIN GRAFT SPLIT THICKNESS LEG / FOOT Right 07/25/2016   LEG  . SKIN SPLIT GRAFT Right 07/27/2016   Procedure: SKIN GRAFT RIGHT LEG WITH THERASKIN APPLICATION;  Surgeon: [name], MD;  Location: [facility];  Service: Orthopedics;  Laterality: Right;  . TONSILLECTOMY     Social History   Occupational History  . Not on file  Tobacco Use  . Smoking status: Never Smoker  . Smokeless tobacco: Never Used  Substance and Sexual Activity  . Alcohol use: No  . Drug use: No  . Sexual activity: Yes    Partners: Male    Birth control/protection: I.U.D.

## 2018-12-14 ENCOUNTER — Telehealth: Payer: Self-pay

## 2018-12-14 DIAGNOSIS — M546 Pain in thoracic spine: Secondary | ICD-10-CM | POA: Diagnosis not present

## 2018-12-14 DIAGNOSIS — F419 Anxiety disorder, unspecified: Secondary | ICD-10-CM | POA: Diagnosis not present

## 2018-12-14 DIAGNOSIS — K219 Gastro-esophageal reflux disease without esophagitis: Secondary | ICD-10-CM | POA: Diagnosis not present

## 2018-12-14 DIAGNOSIS — G8929 Other chronic pain: Secondary | ICD-10-CM | POA: Diagnosis not present

## 2018-12-14 DIAGNOSIS — Z6841 Body Mass Index (BMI) 40.0 and over, adult: Secondary | ICD-10-CM | POA: Diagnosis not present

## 2018-12-14 DIAGNOSIS — Z7901 Long term (current) use of anticoagulants: Secondary | ICD-10-CM | POA: Diagnosis not present

## 2018-12-14 DIAGNOSIS — L97211 Non-pressure chronic ulcer of right calf limited to breakdown of skin: Secondary | ICD-10-CM | POA: Diagnosis not present

## 2018-12-14 DIAGNOSIS — I83012 Varicose veins of right lower extremity with ulcer of calf: Secondary | ICD-10-CM | POA: Diagnosis not present

## 2018-12-14 DIAGNOSIS — J42 Unspecified chronic bronchitis: Secondary | ICD-10-CM | POA: Diagnosis not present

## 2018-12-14 DIAGNOSIS — G4730 Sleep apnea, unspecified: Secondary | ICD-10-CM | POA: Diagnosis not present

## 2018-12-14 DIAGNOSIS — M6281 Muscle weakness (generalized): Secondary | ICD-10-CM | POA: Diagnosis not present

## 2018-12-14 DIAGNOSIS — D649 Anemia, unspecified: Secondary | ICD-10-CM | POA: Diagnosis not present

## 2018-12-14 DIAGNOSIS — Z86718 Personal history of other venous thrombosis and embolism: Secondary | ICD-10-CM | POA: Diagnosis not present

## 2018-12-14 DIAGNOSIS — F329 Major depressive disorder, single episode, unspecified: Secondary | ICD-10-CM | POA: Diagnosis not present

## 2018-12-14 DIAGNOSIS — I10 Essential (primary) hypertension: Secondary | ICD-10-CM | POA: Diagnosis not present

## 2018-12-14 DIAGNOSIS — Z86711 Personal history of pulmonary embolism: Secondary | ICD-10-CM | POA: Diagnosis not present

## 2018-12-14 DIAGNOSIS — M1990 Unspecified osteoarthritis, unspecified site: Secondary | ICD-10-CM | POA: Diagnosis not present

## 2018-12-14 DIAGNOSIS — Z9181 History of falling: Secondary | ICD-10-CM | POA: Diagnosis not present

## 2018-12-14 NOTE — Telephone Encounter (Signed)
Alice [name] [facility] called requesting verbal orders for 1xwk X 1wk followed by 1xwk X 2wks for balance and strength training.  Called her back and approved verbal orders.

## 2018-12-18 ENCOUNTER — Encounter: Payer: Self-pay | Admitting: Family

## 2018-12-18 ENCOUNTER — Ambulatory Visit (INDEPENDENT_AMBULATORY_CARE_PROVIDER_SITE_OTHER): Payer: Medicare Other | Admitting: Family

## 2018-12-18 ENCOUNTER — Other Ambulatory Visit: Payer: Self-pay | Admitting: Family

## 2018-12-18 ENCOUNTER — Other Ambulatory Visit: Payer: Self-pay

## 2018-12-18 VITALS — Ht 66.0 in | Wt 304.0 lb

## 2018-12-18 DIAGNOSIS — L97912 Non-pressure chronic ulcer of unspecified part of right lower leg with fat layer exposed: Secondary | ICD-10-CM | POA: Diagnosis not present

## 2018-12-18 DIAGNOSIS — I87331 Chronic venous hypertension (idiopathic) with ulcer and inflammation of right lower extremity: Secondary | ICD-10-CM

## 2018-12-18 DIAGNOSIS — Z945 Skin transplant status: Secondary | ICD-10-CM

## 2018-12-18 MED ORDER — OXYCODONE HCL 10 MG PO TABS: 10.0000 mg | ORAL_TABLET | Freq: Four times a day (QID) | ORAL | 0 refills | Status: DC | PRN

## 2018-12-18 NOTE — Progress Notes (Signed)
Office Visit Note   Patient: Alice [name]           Date of Birth: 1971/01/08           MRN: [id number] Visit Date: 12/18/2018              Requested by: Benito Mccreedy, MD [facility] [address],  [place] [address] PCP: Benito Mccreedy, MD  Chief Complaint  Patient presents with  . Right Leg - Routine Post Op    09/28/2018 Repeat Deb RLE STSG      HPI: The patient is a 48 year old woman seen today in follow-up for venous ulceration to her right lower extremity.  This most recently was grafted in May of this year.  She has been undergoing twice weekly Dynaflex compression wraps she continues to be quite pleased with her progress.    Assessment & Plan: Visit Diagnoses:  1. Hx of skin graft   2. Chronic ulcer of right leg, with fat layer exposed ([place])     Plan: We will reapply the Dynaflex today show continue doing her own home compression wraps for 2 more weeks we will see her in the office in 2 weeks.    Follow-Up Instructions: Return in about 2 weeks (around 01/01/2019).   Ortho Exam  Patient is alert, oriented, no adenopathy, well-dressed, normal affect, normal respiratory effort.  On examination of the right lower extremity there continues to be epithelialization to the wound edges especially posteriorly.  There is some dried edematous tissues around the edges scattered patches in the wound bed.  There is granulation in the wound bed.  No serous drainage no bleeding.  There is no surrounding erythema no maceration no sign of infection.      Imaging: No results found.   Labs: Lab Results  Component Value Date   REPTSTATUS 10/06/2018 FINAL 10/05/2018   GRAMSTAIN  09/28/2018    FEW WBC PRESENT, PREDOMINANTLY PMN FEW GRAM POSITIVE COCCI IN PAIRS FEW GRAM POSITIVE RODS    CULT 20,000 COLONIES/mL YEAST (A) 10/05/2018   LABORGA PROTEUS MIRABILIS 09/28/2018   LABORGA STAPHYLOCOCCUS CAPRAE 09/28/2018   LABORGA SERRATIA MARCESCENS 09/28/2018      Lab Results  Component Value Date   ALBUMIN 2.5 (L) 10/08/2018   ALBUMIN 3.2 (L) 01/29/2018   ALBUMIN 2.2 (L) 01/03/2017    Body mass index is 49.07 kg/m.  Orders:  No orders of the defined types were placed in this encounter.  No orders of the defined types were placed in this encounter.    Procedures: No procedures performed  Clinical Data: No additional findings.  ROS:  All other systems negative, except as noted in the HPI. Review of Systems  Constitutional: Negative for chills and fever.  Cardiovascular: Negative for leg swelling.  Skin: Positive for wound. Negative for color change.    Objective: Vital Signs: Ht 5\' 6"  (1.676 m)   Wt (!) 304 lb (137.9 kg)   BMI 49.07 kg/m   Specialty Comments:  No specialty comments available.  PMFS History: Patient Active Problem List   Diagnosis Date Noted  . Subtherapeutic international normalized ratio (INR)   . Postoperative pain   . Hypoalbuminemia   . Acute on chronic kidney failure ([place])   . Acute blood loss anemia   . Benign essential HTN   . Debility 10/05/2018  . Chronic ulcer of right leg, with fat layer exposed ([place]) 09/28/2018  . Chronic ulcer of leg, right, with necrosis of muscle ([place])   .  Iron deficiency anemia 01/29/2018  . Pulmonary embolus ([place]) 01/09/2018  . Encounter for therapeutic drug monitoring 01/09/2018  . Severe malnutrition ([place]) 08/24/2017  . Acute kidney injury superimposed on CKD ([place]) 12/28/2016  . Hx of skin graft 08/01/2016  . 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 ([place]) 07/25/2016  . 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 Trichomonal infection 11/24/2015  . Abdominal pain 05/02/2015  . Symptomatic cholelithiasis 04/16/2015  . Acute bilateral upper abdominal pain 04/16/2015  . Microcytic anemia 05/18/2013  . Hypotension, unspecified 05/17/2013  . CKD (chronic kidney disease), stage III  ([place]) 05/17/2013  . Hypokalemia 05/17/2013  . Anal fissure 02/06/2013  . Anal skin tag 02/06/2013  . ALLERGIC RHINITIS 03/05/2010  . LOW BACK PAIN SYNDROME 12/13/2007  . OBESITY, MORBID 12/02/2006  . HTN (hypertension) 12/02/2006  . History of pulmonary embolism 12/02/2006  . History of DVT (deep vein thrombosis) 12/02/2006  . 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 GASTROESOPHAGEAL REFLUX DISEASE 12/02/2006  . OSA (obstructive sleep apnea) 12/02/2006   Past Medical History:  Diagnosis Date  . Anemia   . Anxiety   . Arthritis   . Chronic bronchitis ([place])   . Chronic upper back pain   . Depression   . DVT (deep venous thrombosis) (HCC)    BLE  . GERD (gastroesophageal reflux disease)   . Headache    "weekly" (04/16/2015)  . Hypertension   . Migraine    "monthly" (04/16/2015)  . Morbid obesity with BMI of 50.0-59.9, adult ([place])   . Obesity   . Pneumonia 2014  . Pulmonary embolism ([place])   . Sinusitis nasal   . Sleep apnea    "I'm suppose to wear a mask but I don't" (04/16/2015)  . Varicose veins of right lower extremity   . Venous stasis of lower extremity    right  . Wears dentures   . Wears glasses     Family History  Problem Relation Age of Onset  . Kidney disease Mother        kidney transplant    Past Surgical History:  Procedure Laterality Date  . CHOLECYSTECTOMY N/A 04/18/2015   Procedure: LAPAROSCOPIC CHOLECYSTECTOMY;  Surgeon: Ralene Ok, MD;  Location: [facility];  Service: General;  Laterality: N/A;  . HYSTEROSCOPY W/D&C  12/24/2001   Archie Endo 09/28/2010  . I&D EXTREMITY Right 07/25/2016   Procedure: IRRIGATION AND DEBRIDEMENT RIGHT LEG ULCER, APPLY VERAFLO VAC;  Surgeon: [name], MD;  Location: [facility];  Service: Orthopedics;  Laterality: Right;  . I&D EXTREMITY Right 09/28/2018   Procedure: DEBRIDEMENT RIGHT LOWER LEG WITH PLACEMENT WITH PLACEMENT OF SKIN GRAFT AND VAC;  Surgeon: [name], MD;  Location: [facility];  Service: Orthopedics;   Laterality: Right;  . I&D EXTREMITY Right 10/03/2018   Procedure: REPEAT IRRIGATION AND DEBRIDEMENT RIGHT LOWER LEG, VAC PLACEMENT;  Surgeon: [name], MD;  Location: [facility];  Service: Orthopedics;  Laterality: Right;  . INCISE AND DRAIN ABCESS Right 07/14/2016  . INCISION AND DRAINAGE Right 09/10/2008   leg:  skin and soft tissue and muscle/notes 09/15/2010  . INCISION AND DRAINAGE Right 01/01/2008   Chronic venous stasis insufficiency ulcer,/notes 09/14/2010/  . INCISION AND DRAINAGE Right 06/945   calf w/application wound vac/notes 07/20/2010  . INCISION AND DRAINAGE Right 07/25/2016   IRRIGATION AND DEBRIDEMENT RIGHT LEG ULCER,  . LAPAROSCOPIC GASTRIC BYPASS  ~ 2007  . MULTIPLE TOOTH EXTRACTIONS    .  SKIN GRAFT SPLIT THICKNESS LEG / FOOT Right 07/25/2016   LEG  . SKIN SPLIT GRAFT Right 07/27/2016   Procedure: SKIN GRAFT RIGHT LEG WITH THERASKIN APPLICATION;  Surgeon: [name], MD;  Location: [facility];  Service: Orthopedics;  Laterality: Right;  . TONSILLECTOMY     Social History   Occupational History  . Not on file  Tobacco Use  . Smoking status: Never Smoker  . Smokeless tobacco: Never Used  Substance and Sexual Activity  . Alcohol use: No  . Drug use: No  . Sexual activity: Yes    Partners: Male    Birth control/protection: I.U.D.

## 2018-12-19 DIAGNOSIS — L97211 Non-pressure chronic ulcer of right calf limited to breakdown of skin: Secondary | ICD-10-CM | POA: Diagnosis not present

## 2018-12-19 DIAGNOSIS — M546 Pain in thoracic spine: Secondary | ICD-10-CM | POA: Diagnosis not present

## 2018-12-19 DIAGNOSIS — M6281 Muscle weakness (generalized): Secondary | ICD-10-CM | POA: Diagnosis not present

## 2018-12-19 DIAGNOSIS — I83012 Varicose veins of right lower extremity with ulcer of calf: Secondary | ICD-10-CM | POA: Diagnosis not present

## 2018-12-19 DIAGNOSIS — G8929 Other chronic pain: Secondary | ICD-10-CM | POA: Diagnosis not present

## 2018-12-19 DIAGNOSIS — M1990 Unspecified osteoarthritis, unspecified site: Secondary | ICD-10-CM | POA: Diagnosis not present

## 2018-12-26 DIAGNOSIS — M6281 Muscle weakness (generalized): Secondary | ICD-10-CM | POA: Diagnosis not present

## 2018-12-26 DIAGNOSIS — I83012 Varicose veins of right lower extremity with ulcer of calf: Secondary | ICD-10-CM | POA: Diagnosis not present

## 2018-12-26 DIAGNOSIS — L97211 Non-pressure chronic ulcer of right calf limited to breakdown of skin: Secondary | ICD-10-CM | POA: Diagnosis not present

## 2018-12-26 DIAGNOSIS — G8929 Other chronic pain: Secondary | ICD-10-CM | POA: Diagnosis not present

## 2018-12-26 DIAGNOSIS — M1990 Unspecified osteoarthritis, unspecified site: Secondary | ICD-10-CM | POA: Diagnosis not present

## 2018-12-26 DIAGNOSIS — M546 Pain in thoracic spine: Secondary | ICD-10-CM | POA: Diagnosis not present

## 2019-01-01 ENCOUNTER — Ambulatory Visit (INDEPENDENT_AMBULATORY_CARE_PROVIDER_SITE_OTHER): Payer: Medicare Other | Admitting: Family

## 2019-01-01 ENCOUNTER — Encounter: Payer: Self-pay | Admitting: Family

## 2019-01-01 ENCOUNTER — Telehealth: Payer: Self-pay

## 2019-01-01 ENCOUNTER — Other Ambulatory Visit: Payer: Self-pay | Admitting: Orthopedic Surgery

## 2019-01-01 DIAGNOSIS — I87331 Chronic venous hypertension (idiopathic) with ulcer and inflammation of right lower extremity: Secondary | ICD-10-CM | POA: Diagnosis not present

## 2019-01-01 DIAGNOSIS — L97919 Non-pressure chronic ulcer of unspecified part of right lower leg with unspecified severity: Secondary | ICD-10-CM

## 2019-01-01 DIAGNOSIS — Z945 Skin transplant status: Secondary | ICD-10-CM

## 2019-01-01 MED ORDER — OXYCODONE HCL 10 MG PO TABS: 10.0000 mg | ORAL_TABLET | Freq: Four times a day (QID) | ORAL | 0 refills | Status: DC | PRN

## 2019-01-01 NOTE — Telephone Encounter (Signed)
rx sent

## 2019-01-01 NOTE — Progress Notes (Addendum)
Office Visit Note   Patient: Alice [name]           Date of Birth: 11-Sep-1970           MRN: [id number] Visit Date: 01/01/2019              Requested by: Benito Mccreedy, MD [id number] [place],  [place] [address] PCP: Benito Mccreedy, MD  No chief complaint on file.     HPI: Patient is a 48 year old woman with chronic venous insufficiency right leg status post multiple skin grafts she is currently undergoing 3 layer compression wraps complains of increased pain.  Assessment & Plan: Visit Diagnoses:  1. 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  Follow-Up Instructions: Return in about 2 weeks (around 01/15/2019).   Ortho Exam  Patient is alert, oriented, no adenopathy, well-dressed, normal affect, normal respiratory effort. Examination patient's wound shows excellent improvement measures 6 x 11 cm 1 mm deep there is no redness no cellulitis no drainage no odor no signs of infection.  Imaging: No results found. No images are attached to the encounter.  Labs: Lab Results  Component Value Date   REPTSTATUS 10/06/2018 FINAL 10/05/2018   GRAMSTAIN  09/28/2018    FEW WBC PRESENT, PREDOMINANTLY PMN FEW GRAM POSITIVE COCCI IN PAIRS FEW GRAM POSITIVE RODS    CULT 20,000 COLONIES/mL YEAST (A) 10/05/2018   LABORGA PROTEUS MIRABILIS 09/28/2018   LABORGA STAPHYLOCOCCUS CAPRAE 09/28/2018   LABORGA SERRATIA MARCESCENS 09/28/2018     Lab Results  Component Value Date   ALBUMIN 2.5 (L) 10/08/2018   ALBUMIN 3.2 (L) 01/29/2018   ALBUMIN 2.2 (L) 01/03/2017    Lab Results  Component Value Date   MG 1.8 12/29/2016   MG 1.9 12/28/2016   MG 1.9 05/17/2013   No results found for: VD25OH  No results  found for: PREALBUMIN CBC EXTENDED Latest Ref Rng & Units 10/13/2018 10/12/2018 10/12/2018  WBC 4.0 - 10.5 K/uL 8.4 - 7.8  RBC 3.87 - 5.11 MIL/uL 3.92 2.91(L) 2.70(L)  HGB 12.0 - 15.0 g/dL 9.9(L) 7.4(L) 6.6(LL)  HCT 36.0 - 46.0 % 30.3(L) 22.7(L) 20.6(L)  PLT 150 - 400 K/uL 389 - 333  NEUTROABS 1.7 - 7.7 K/uL - - -  LYMPHSABS 0.7 - 4.0 K/uL - - -     There is no height or weight on file to calculate BMI.  Orders:  No orders of the defined types were placed in this encounter.  No orders of the defined types were placed in this encounter.    Procedures: No procedures performed  Clinical Data: No additional findings.  ROS:  All other systems negative, except as noted in the HPI. Review of Systems  Objective: Vital Signs: There were no vitals taken for this visit.  Specialty Comments:  No specialty comments available.  PMFS History: Patient Active Problem List   Diagnosis Date Noted  . Subtherapeutic international normalized ratio (INR)   . Postoperative pain   . Hypoalbuminemia   . Acute on chronic kidney failure ([place])   . Acute blood loss anemia   . Benign essential HTN   . Debility 10/05/2018  . Chronic ulcer of right leg, with fat layer exposed ([place]) 09/28/2018  . Chronic ulcer of  leg, right, with necrosis of muscle ([place])   . Iron deficiency anemia 01/29/2018  . Pulmonary embolus ([place]) 01/09/2018  . Encounter for therapeutic drug monitoring 01/09/2018  . Severe malnutrition ([place]) 08/24/2017  . Acute kidney injury superimposed on CKD ([place]) 12/28/2016  . Hx of skin graft 08/01/2016  . 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 ([place]) 07/25/2016  . 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 Trichomonal infection 11/24/2015  . Abdominal pain 05/02/2015  . Symptomatic cholelithiasis 04/16/2015  . Acute bilateral upper abdominal pain 04/16/2015  . Microcytic anemia 05/18/2013  .  Hypotension, unspecified 05/17/2013  . CKD (chronic kidney disease), stage III ([place]) 05/17/2013  . Hypokalemia 05/17/2013  . Anal fissure 02/06/2013  . Anal skin tag 02/06/2013  . ALLERGIC RHINITIS 03/05/2010  . LOW BACK PAIN SYNDROME 12/13/2007  . OBESITY, MORBID 12/02/2006  . HTN (hypertension) 12/02/2006  . History of pulmonary embolism 12/02/2006  . History of DVT (deep vein thrombosis) 12/02/2006  . 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 GASTROESOPHAGEAL REFLUX DISEASE 12/02/2006  . OSA (obstructive sleep apnea) 12/02/2006   Past Medical History:  Diagnosis Date  . Anemia   . Anxiety   . Arthritis   . Chronic bronchitis ([place])   . Chronic upper back pain   . Depression   . DVT (deep venous thrombosis) (HCC)    BLE  . GERD (gastroesophageal reflux disease)   . Headache    "weekly" (04/16/2015)  . Hypertension   . Migraine    "monthly" (04/16/2015)  . Morbid obesity with BMI of 50.0-59.9, adult ([place])   . Obesity   . Pneumonia 2014  . Pulmonary embolism ([place])   . Sinusitis nasal   . Sleep apnea    "I'm suppose to wear a mask but I don't" (04/16/2015)  . Varicose veins of right lower extremity   . Venous stasis of lower extremity    right  . Wears dentures   . Wears glasses     Family History  Problem Relation Age of Onset  . Kidney disease Mother        kidney transplant    Past Surgical History:  Procedure Laterality Date  . CHOLECYSTECTOMY N/A 04/18/2015   Procedure: LAPAROSCOPIC CHOLECYSTECTOMY;  Surgeon: Ralene Ok, MD;  Location: [facility];  Service: General;  Laterality: N/A;  . HYSTEROSCOPY W/D&C  12/24/2001   Archie Endo 09/28/2010  . I&D EXTREMITY Right 07/25/2016   Procedure: IRRIGATION AND DEBRIDEMENT RIGHT LEG ULCER, APPLY VERAFLO VAC;  Surgeon: [name], MD;  Location: [facility];  Service: Orthopedics;  Laterality: Right;  . I&D EXTREMITY Right 09/28/2018   Procedure: DEBRIDEMENT RIGHT LOWER LEG WITH PLACEMENT WITH PLACEMENT OF SKIN GRAFT  AND VAC;  Surgeon: [name], MD;  Location: [facility];  Service: Orthopedics;  Laterality: Right;  . I&D EXTREMITY Right 10/03/2018   Procedure: REPEAT IRRIGATION AND DEBRIDEMENT RIGHT LOWER LEG, VAC PLACEMENT;  Surgeon: [name], MD;  Location: [facility];  Service: Orthopedics;  Laterality: Right;  . INCISE AND DRAIN ABCESS Right 07/14/2016  . INCISION AND DRAINAGE Right 09/10/2008   leg:  skin and soft tissue and muscle/notes 09/15/2010  . INCISION AND DRAINAGE Right 01/01/2008   Chronic venous stasis insufficiency ulcer,/notes 09/14/2010/  . INCISION AND DRAINAGE Right [id number]   calf w/application wound vac/notes 07/20/2010  . INCISION AND DRAINAGE Right 07/25/2016   IRRIGATION AND DEBRIDEMENT RIGHT LEG ULCER,  . LAPAROSCOPIC  GASTRIC BYPASS  ~ 2007  . MULTIPLE TOOTH EXTRACTIONS    . SKIN GRAFT SPLIT THICKNESS LEG / FOOT Right 07/25/2016   LEG  . SKIN SPLIT GRAFT Right 07/27/2016   Procedure: SKIN GRAFT RIGHT LEG WITH THERASKIN APPLICATION;  Surgeon: [name], MD;  Location: [facility];  Service: Orthopedics;  Laterality: Right;  . TONSILLECTOMY     Social History   Occupational History  . Not on file  Tobacco Use  . Smoking status: Never Smoker  . Smokeless tobacco: Never Used  Substance and Sexual Activity  . Alcohol use: No  . Drug use: No  . Sexual activity: Yes    Partners: Male    Birth control/protection: I.U.D.

## 2019-01-01 NOTE — Telephone Encounter (Signed)
Pt called and advised that you were supposed to call in rx for pain medication for her and that she pharmacy advised it was not sent in.

## 2019-01-02 DIAGNOSIS — M1990 Unspecified osteoarthritis, unspecified site: Secondary | ICD-10-CM | POA: Diagnosis not present

## 2019-01-02 DIAGNOSIS — M6281 Muscle weakness (generalized): Secondary | ICD-10-CM | POA: Diagnosis not present

## 2019-01-02 DIAGNOSIS — M546 Pain in thoracic spine: Secondary | ICD-10-CM | POA: Diagnosis not present

## 2019-01-02 DIAGNOSIS — L97211 Non-pressure chronic ulcer of right calf limited to breakdown of skin: Secondary | ICD-10-CM | POA: Diagnosis not present

## 2019-01-02 DIAGNOSIS — G8929 Other chronic pain: Secondary | ICD-10-CM | POA: Diagnosis not present

## 2019-01-02 DIAGNOSIS — I83012 Varicose veins of right lower extremity with ulcer of calf: Secondary | ICD-10-CM | POA: Diagnosis not present

## 2019-01-08 ENCOUNTER — Other Ambulatory Visit: Payer: Self-pay | Admitting: Family

## 2019-01-08 ENCOUNTER — Telehealth: Payer: Self-pay | Admitting: Family

## 2019-01-08 DIAGNOSIS — I87331 Chronic venous hypertension (idiopathic) with ulcer and inflammation of right lower extremity: Secondary | ICD-10-CM

## 2019-01-08 DIAGNOSIS — L97919 Non-pressure chronic ulcer of unspecified part of right lower leg with unspecified severity: Secondary | ICD-10-CM

## 2019-01-08 DIAGNOSIS — Z945 Skin transplant status: Secondary | ICD-10-CM

## 2019-01-08 MED ORDER — OXYCODONE HCL 10 MG PO TABS: 10.0000 mg | ORAL_TABLET | Freq: Four times a day (QID) | ORAL | 0 refills | Status: DC | PRN

## 2019-01-08 NOTE — Telephone Encounter (Signed)
Pt was given rx last week for this # 20 requesting refill please advise.

## 2019-01-08 NOTE — Telephone Encounter (Signed)
I called pt and advised that rx is at the pharm.

## 2019-01-08 NOTE — Telephone Encounter (Signed)
Rx Oxycodone HCI 10 mg  [facility] @ [address]

## 2019-01-08 NOTE — Telephone Encounter (Signed)
Too soon for refill.

## 2019-01-13 DIAGNOSIS — F329 Major depressive disorder, single episode, unspecified: Secondary | ICD-10-CM | POA: Diagnosis not present

## 2019-01-13 DIAGNOSIS — F419 Anxiety disorder, unspecified: Secondary | ICD-10-CM | POA: Diagnosis not present

## 2019-01-13 DIAGNOSIS — M6281 Muscle weakness (generalized): Secondary | ICD-10-CM | POA: Diagnosis not present

## 2019-01-13 DIAGNOSIS — Z86718 Personal history of other venous thrombosis and embolism: Secondary | ICD-10-CM | POA: Diagnosis not present

## 2019-01-13 DIAGNOSIS — M546 Pain in thoracic spine: Secondary | ICD-10-CM | POA: Diagnosis not present

## 2019-01-13 DIAGNOSIS — I10 Essential (primary) hypertension: Secondary | ICD-10-CM | POA: Diagnosis not present

## 2019-01-13 DIAGNOSIS — Z86711 Personal history of pulmonary embolism: Secondary | ICD-10-CM | POA: Diagnosis not present

## 2019-01-13 DIAGNOSIS — Z7901 Long term (current) use of anticoagulants: Secondary | ICD-10-CM | POA: Diagnosis not present

## 2019-01-13 DIAGNOSIS — Z6841 Body Mass Index (BMI) 40.0 and over, adult: Secondary | ICD-10-CM | POA: Diagnosis not present

## 2019-01-13 DIAGNOSIS — K219 Gastro-esophageal reflux disease without esophagitis: Secondary | ICD-10-CM | POA: Diagnosis not present

## 2019-01-13 DIAGNOSIS — J42 Unspecified chronic bronchitis: Secondary | ICD-10-CM | POA: Diagnosis not present

## 2019-01-13 DIAGNOSIS — L97211 Non-pressure chronic ulcer of right calf limited to breakdown of skin: Secondary | ICD-10-CM | POA: Diagnosis not present

## 2019-01-13 DIAGNOSIS — Z9181 History of falling: Secondary | ICD-10-CM | POA: Diagnosis not present

## 2019-01-13 DIAGNOSIS — G8929 Other chronic pain: Secondary | ICD-10-CM | POA: Diagnosis not present

## 2019-01-13 DIAGNOSIS — M1990 Unspecified osteoarthritis, unspecified site: Secondary | ICD-10-CM | POA: Diagnosis not present

## 2019-01-13 DIAGNOSIS — D649 Anemia, unspecified: Secondary | ICD-10-CM | POA: Diagnosis not present

## 2019-01-13 DIAGNOSIS — I83012 Varicose veins of right lower extremity with ulcer of calf: Secondary | ICD-10-CM | POA: Diagnosis not present

## 2019-01-13 DIAGNOSIS — G4730 Sleep apnea, unspecified: Secondary | ICD-10-CM | POA: Diagnosis not present

## 2019-01-15 ENCOUNTER — Encounter: Payer: Self-pay | Admitting: Family

## 2019-01-15 ENCOUNTER — Ambulatory Visit (INDEPENDENT_AMBULATORY_CARE_PROVIDER_SITE_OTHER): Payer: Medicare Other | Admitting: Family

## 2019-01-15 VITALS — Ht 66.0 in | Wt 304.0 lb

## 2019-01-15 DIAGNOSIS — Z945 Skin transplant status: Secondary | ICD-10-CM

## 2019-01-15 DIAGNOSIS — I87331 Chronic venous hypertension (idiopathic) with ulcer and inflammation of right lower extremity: Secondary | ICD-10-CM | POA: Diagnosis not present

## 2019-01-15 DIAGNOSIS — L97919 Non-pressure chronic ulcer of unspecified part of right lower leg with unspecified severity: Secondary | ICD-10-CM | POA: Diagnosis not present

## 2019-01-15 DIAGNOSIS — G5601 Carpal tunnel syndrome, right upper limb: Secondary | ICD-10-CM | POA: Diagnosis not present

## 2019-01-15 MED ORDER — OXYCODONE HCL 10 MG PO TABS: 10.0000 mg | ORAL_TABLET | Freq: Four times a day (QID) | ORAL | 0 refills | Status: DC | PRN

## 2019-01-15 NOTE — Progress Notes (Signed)
Office Visit Note   Patient: Alice [name]           Date of Birth: 1970-07-07           MRN: [id number] Visit Date: 01/15/2019              Requested by: Benito Mccreedy, MD [place] [id number] [place],  [place] [address] PCP: Benito Mccreedy, MD  Chief Complaint  Patient presents with  . Right Leg - Follow-up    09/28/18 RLE deb      HPI: Patient is a 48 year old woman with chronic venous insufficiency ulcer right leg.  Patient also is status post nerve conduction studies which shows moderate to severe carpal tunnel symptoms of the right wrist.  Patient states she still has numbness in the thumb index and long finger and decreased grip strength.  Assessment & Plan: Visit Diagnoses:  1. Carpal tunnel syndrome, right upper limb   2. 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 (HCC)     Plan: Patient's wound bed continues to improve we will continue with the 3 layer compression wrap.  Patient was also advised regarding treatment for carpal tunnel syndrome.  Discussed that she can continue with conservative care versus open carpal tunnel release that she would need to minimize use of her hand for 2 weeks until the incision healed.  Discussed risks and benefits of surgery including infection neurovascular injury persistent pain persistent weakness need for additional surgery.  Patient states she understands and will call us when she wants to proceed with carpal tunnel surgery.  Follow-Up Instructions: Return in about 2 weeks (around 01/29/2019).   Ortho Exam  Patient is alert, oriented, no adenopathy, well-dressed, normal affect, normal respiratory effort. Examination patient does have grip strength weakness on the right she does have tenderness to palpation over the transverse carpal ligament flexion reproduces her pain nerve conduction studies were reviewed which showed moderate to severe nerve conduction velocity changes.   Examination of the right leg patient has excellent improvement in the wound bed there is good epithelization around the wound edges there is an excellent beefy granulation tissue the wound measures 4.5 x 10 cm and is 0.1 mm deep.  Imaging: No results found. No images are attached to the encounter.  Labs: Lab Results  Component Value Date   REPTSTATUS 10/06/2018 FINAL 10/05/2018   GRAMSTAIN  09/28/2018    FEW WBC PRESENT, PREDOMINANTLY PMN FEW GRAM POSITIVE COCCI IN PAIRS FEW GRAM POSITIVE RODS    CULT 20,000 COLONIES/mL YEAST (A) 10/05/2018   LABORGA PROTEUS MIRABILIS 09/28/2018   LABORGA STAPHYLOCOCCUS CAPRAE 09/28/2018   LABORGA SERRATIA MARCESCENS 09/28/2018     Lab Results  Component Value Date   ALBUMIN 2.5 (L) 10/08/2018   ALBUMIN 3.2 (L) 01/29/2018   ALBUMIN 2.2 (L) 01/03/2017    Lab Results  Component Value Date   MG 1.8 12/29/2016   MG 1.9 12/28/2016   MG 1.9 05/17/2013   No results found for: VD25OH  No results found for: PREALBUMIN CBC EXTENDED Latest Ref Rng & Units 10/13/2018 10/12/2018 10/12/2018  WBC 4.0 - 10.5 K/uL 8.4 - 7.8  RBC 3.87 - 5.11 MIL/uL 3.92 2.91(L) 2.70(L)  HGB 12.0 - 15.0 g/dL 9.9(L) 7.4(L) 6.6(LL)  HCT 36.0 - 46.0 % 30.3(L) 22.7(L) 20.6(L)  PLT 150 - 400 K/uL 389 - 333  NEUTROABS 1.7 - 7.7 K/uL - - -  LYMPHSABS 0.7 - 4.0 K/uL - - -  Body mass index is 49.07 kg/m.  Orders:  No orders of the defined types were placed in this encounter.  No orders of the defined types were placed in this encounter.    Procedures: No procedures performed  Clinical Data: No additional findings.  ROS:  All other systems negative, except as noted in the HPI. Review of Systems  Objective: Vital Signs: Ht 5\' 6"  (1.676 m)   Wt (!) 304 lb (137.9 kg)   BMI 49.07 kg/m   Specialty Comments:  No specialty comments available.  PMFS History: Patient Active Problem List   Diagnosis Date Noted  . Subtherapeutic international normalized ratio  (INR)   . Postoperative pain   . Hypoalbuminemia   . Acute on chronic kidney failure ([place])   . Acute blood loss anemia   . Benign essential HTN   . Debility 10/05/2018  . Chronic ulcer of right leg, with fat layer exposed ([place]) 09/28/2018  . Chronic ulcer of leg, right, with necrosis of muscle ([place])   . Iron deficiency anemia 01/29/2018  . Pulmonary embolus ([place]) 01/09/2018  . Encounter for therapeutic drug monitoring 01/09/2018  . Severe malnutrition ([place]) 08/24/2017  . Acute kidney injury superimposed on CKD ([place]) 12/28/2016  . Hx of skin graft 08/01/2016  . 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 ([place]) 07/25/2016  . 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 ([place]) 07/05/2016  . Trichomonal infection 11/24/2015  . Abdominal pain 05/02/2015  . Symptomatic cholelithiasis 04/16/2015  . Acute bilateral upper abdominal pain 04/16/2015  . Microcytic anemia 05/18/2013  . Hypotension, unspecified 05/17/2013  . CKD (chronic kidney disease), stage III ([place]) 05/17/2013  . Hypokalemia 05/17/2013  . Anal fissure 02/06/2013  . Anal skin tag 02/06/2013  . ALLERGIC RHINITIS 03/05/2010  . LOW BACK PAIN SYNDROME 12/13/2007  . OBESITY, MORBID 12/02/2006  . HTN (hypertension) 12/02/2006  . History of pulmonary embolism 12/02/2006  . History of DVT (deep vein thrombosis) 12/02/2006  . 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 GASTROESOPHAGEAL REFLUX DISEASE 12/02/2006  . OSA (obstructive sleep apnea) 12/02/2006   Past Medical History:  Diagnosis Date  . Anemia   . Anxiety   . Arthritis   . Chronic bronchitis ([place])   . Chronic upper back pain   . Depression   . DVT (deep venous thrombosis) (HCC)    BLE  . GERD (gastroesophageal reflux disease)   . Headache    "weekly" (04/16/2015)  . Hypertension   . Migraine    "monthly" (04/16/2015)  . Morbid obesity with BMI of 50.0-59.9, adult ([place])   . Obesity   .  Pneumonia 2014  . Pulmonary embolism ([place])   . Sinusitis nasal   . Sleep apnea    "I'm suppose to wear a mask but I don't" (04/16/2015)  . Varicose veins of right lower extremity   . Venous stasis of lower extremity    right  . Wears dentures   . Wears glasses     Family History  Problem Relation Age of Onset  . Kidney disease Mother        kidney transplant    Past Surgical History:  Procedure Laterality Date  . CHOLECYSTECTOMY N/A 04/18/2015   Procedure: LAPAROSCOPIC CHOLECYSTECTOMY;  Surgeon: Ralene Ok, MD;  Location: [facility];  Service: General;  Laterality: N/A;  . HYSTEROSCOPY W/D&C  12/24/2001   Archie Endo 09/28/2010  . I&D EXTREMITY Right 07/25/2016   Procedure: IRRIGATION AND DEBRIDEMENT RIGHT LEG  ULCER, APPLY VERAFLO VAC;  Surgeon: [name], MD;  Location: [facility];  Service: Orthopedics;  Laterality: Right;  . I&D EXTREMITY Right 09/28/2018   Procedure: DEBRIDEMENT RIGHT LOWER LEG WITH PLACEMENT WITH PLACEMENT OF SKIN GRAFT AND VAC;  Surgeon: [name], MD;  Location: [facility];  Service: Orthopedics;  Laterality: Right;  . I&D EXTREMITY Right 10/03/2018   Procedure: REPEAT IRRIGATION AND DEBRIDEMENT RIGHT LOWER LEG, VAC PLACEMENT;  Surgeon: [name], MD;  Location: [facility];  Service: Orthopedics;  Laterality: Right;  . INCISE AND DRAIN ABCESS Right 07/14/2016  . INCISION AND DRAINAGE Right 09/10/2008   leg:  skin and soft tissue and muscle/notes 09/15/2010  . INCISION AND DRAINAGE Right 01/01/2008   Chronic venous stasis insufficiency ulcer,/notes 09/14/2010/  . INCISION AND DRAINAGE Right [id number]   calf w/application wound vac/notes 07/20/2010  . INCISION AND DRAINAGE Right 07/25/2016   IRRIGATION AND DEBRIDEMENT RIGHT LEG ULCER,  . LAPAROSCOPIC GASTRIC BYPASS  ~ 2007  . MULTIPLE TOOTH EXTRACTIONS    . SKIN GRAFT SPLIT THICKNESS LEG / FOOT Right 07/25/2016   LEG  . SKIN SPLIT GRAFT Right 07/27/2016   Procedure: SKIN GRAFT RIGHT LEG WITH THERASKIN APPLICATION;  Surgeon:  [name], MD;  Location: [facility];  Service: Orthopedics;  Laterality: Right;  . TONSILLECTOMY     Social History   Occupational History  . Not on file  Tobacco Use  . Smoking status: Never Smoker  . Smokeless tobacco: Never Used  Substance and Sexual Activity  . Alcohol use: No  . Drug use: No  . Sexual activity: Yes    Partners: Male    Birth control/protection: I.U.D.

## 2019-01-15 NOTE — Addendum Note (Signed)
Addended by: Meridee Score on: 01/15/2019 12:27 PM   Modules accepted: Orders

## 2019-01-22 DIAGNOSIS — M546 Pain in thoracic spine: Secondary | ICD-10-CM | POA: Diagnosis not present

## 2019-01-22 DIAGNOSIS — G8929 Other chronic pain: Secondary | ICD-10-CM | POA: Diagnosis not present

## 2019-01-22 DIAGNOSIS — M6281 Muscle weakness (generalized): Secondary | ICD-10-CM | POA: Diagnosis not present

## 2019-01-22 DIAGNOSIS — I83012 Varicose veins of right lower extremity with ulcer of calf: Secondary | ICD-10-CM | POA: Diagnosis not present

## 2019-01-22 DIAGNOSIS — L97211 Non-pressure chronic ulcer of right calf limited to breakdown of skin: Secondary | ICD-10-CM | POA: Diagnosis not present

## 2019-01-22 DIAGNOSIS — M1990 Unspecified osteoarthritis, unspecified site: Secondary | ICD-10-CM | POA: Diagnosis not present

## 2019-01-23 ENCOUNTER — Telehealth: Payer: Self-pay

## 2019-01-23 ENCOUNTER — Other Ambulatory Visit: Payer: Self-pay | Admitting: Orthopaedic Surgery

## 2019-01-23 DIAGNOSIS — I87331 Chronic venous hypertension (idiopathic) with ulcer and inflammation of right lower extremity: Secondary | ICD-10-CM

## 2019-01-23 DIAGNOSIS — L97919 Non-pressure chronic ulcer of unspecified part of right lower leg with unspecified severity: Secondary | ICD-10-CM

## 2019-01-23 DIAGNOSIS — Z945 Skin transplant status: Secondary | ICD-10-CM

## 2019-01-23 MED ORDER — OXYCODONE HCL 10 MG PO TABS: 10.0000 mg | ORAL_TABLET | Freq: Four times a day (QID) | ORAL | 0 refills | Status: DC | PRN

## 2019-01-23 NOTE — Telephone Encounter (Signed)
I'll send in some more.

## 2019-01-23 NOTE — Telephone Encounter (Signed)
Can we advise for [facility]

## 2019-01-23 NOTE — Telephone Encounter (Signed)
Pt last refill 01/15/19 #20 Oxycodone 10 mg and is asking for another refill. S/p STSG RLE and is having significant leg pain CB # [phone]

## 2019-01-25 DIAGNOSIS — L97211 Non-pressure chronic ulcer of right calf limited to breakdown of skin: Secondary | ICD-10-CM | POA: Diagnosis not present

## 2019-01-25 DIAGNOSIS — M6281 Muscle weakness (generalized): Secondary | ICD-10-CM | POA: Diagnosis not present

## 2019-01-25 DIAGNOSIS — G8929 Other chronic pain: Secondary | ICD-10-CM | POA: Diagnosis not present

## 2019-01-25 DIAGNOSIS — M1990 Unspecified osteoarthritis, unspecified site: Secondary | ICD-10-CM | POA: Diagnosis not present

## 2019-01-25 DIAGNOSIS — I83012 Varicose veins of right lower extremity with ulcer of calf: Secondary | ICD-10-CM | POA: Diagnosis not present

## 2019-01-25 DIAGNOSIS — M546 Pain in thoracic spine: Secondary | ICD-10-CM | POA: Diagnosis not present

## 2019-01-29 ENCOUNTER — Ambulatory Visit (INDEPENDENT_AMBULATORY_CARE_PROVIDER_SITE_OTHER): Payer: Medicare Other | Admitting: Family

## 2019-01-29 ENCOUNTER — Encounter: Payer: Self-pay | Admitting: Family

## 2019-01-29 VITALS — Ht 66.0 in | Wt 304.0 lb

## 2019-01-29 DIAGNOSIS — I87331 Chronic venous hypertension (idiopathic) with ulcer and inflammation of right lower extremity: Secondary | ICD-10-CM

## 2019-01-29 DIAGNOSIS — L97919 Non-pressure chronic ulcer of unspecified part of right lower leg with unspecified severity: Secondary | ICD-10-CM | POA: Diagnosis not present

## 2019-01-29 DIAGNOSIS — Z945 Skin transplant status: Secondary | ICD-10-CM | POA: Diagnosis not present

## 2019-01-29 NOTE — Progress Notes (Signed)
Office Visit Note   Patient: Alice [name]           Date of Birth: February 25, 1971           MRN: [id number] Visit Date: 01/29/2019              Requested by: Benito Mccreedy, MD [place] [id number] [place],  [place] [address] PCP: Benito Mccreedy, MD  Chief Complaint  Patient presents with  . Right Leg - Follow-up    RLE I&D 09/2018      HPI: Patient is a 48 year old woman with chronic venous insufficiency ulcer right leg.  Is quite pleased with continued improvement of ulcer. Has been doing some dressing changes herself. Assessment & Plan: Visit Diagnoses:  No diagnosis found.  Plan: Patient's wound bed continues to improve we will continue with the 3 layer compression wrap.  Follow up in office in 2 weeks.  Follow-Up Instructions: No follow-ups on file.   Ortho Exam  Patient is alert, oriented, no adenopathy, well-dressed, normal affect, normal respiratory effort. Examination of the right leg patient has excellent improvement in the wound bed there is good epithelization around the wound edges there is an excellent beefy granulation tissue. No drainage. No surrounding erythema.  Imaging: No results found. No images are attached to the encounter.  Labs: Lab Results  Component Value Date   REPTSTATUS 10/06/2018 FINAL 10/05/2018   GRAMSTAIN  09/28/2018    FEW WBC PRESENT, PREDOMINANTLY PMN FEW GRAM POSITIVE COCCI IN PAIRS FEW GRAM POSITIVE RODS    CULT 20,000 COLONIES/mL YEAST (A) 10/05/2018   LABORGA PROTEUS MIRABILIS 09/28/2018   LABORGA STAPHYLOCOCCUS CAPRAE 09/28/2018   LABORGA SERRATIA MARCESCENS 09/28/2018     Lab Results  Component Value Date   ALBUMIN 2.5 (L) 10/08/2018   ALBUMIN 3.2 (L) 01/29/2018   ALBUMIN 2.2 (L) 01/03/2017    Lab Results  Component Value Date   MG 1.8 12/29/2016   MG 1.9 12/28/2016   MG 1.9 05/17/2013   No results found for: VD25OH  No results found for: PREALBUMIN CBC EXTENDED Latest Ref Rng & Units 10/13/2018  10/12/2018 10/12/2018  WBC 4.0 - 10.5 K/uL 8.4 - 7.8  RBC 3.87 - 5.11 MIL/uL 3.92 2.91(L) 2.70(L)  HGB 12.0 - 15.0 g/dL 9.9(L) 7.4(L) 6.6(LL)  HCT 36.0 - 46.0 % 30.3(L) 22.7(L) 20.6(L)  PLT 150 - 400 K/uL 389 - 333  NEUTROABS 1.7 - 7.7 K/uL - - -  LYMPHSABS 0.7 - 4.0 K/uL - - -     Body mass index is 49.07 kg/m.  Orders:  No orders of the defined types were placed in this encounter.  No orders of the defined types were placed in this encounter.    Procedures: No procedures performed  Clinical Data: No additional findings.  ROS:  All other systems negative, except as noted in the HPI. Review of Systems  Constitutional: Negative for chills and fever.  Skin: Positive for wound. Negative for color change.    Objective: Vital Signs: Ht 5\' 6"  (1.676 m)   Wt (!) 304 lb (137.9 kg)   BMI 49.07 kg/m   Specialty Comments:  No specialty comments available.  PMFS History: Patient Active Problem List   Diagnosis Date Noted  . Subtherapeutic international normalized ratio (INR)   . Postoperative pain   . Hypoalbuminemia   . Acute on chronic kidney failure ([place])   . Acute blood loss anemia   . Benign essential HTN   . Debility 10/05/2018  .  Chronic ulcer of right leg, with fat layer exposed ([place]) 09/28/2018  . Chronic ulcer of leg, right, with necrosis of muscle ([place])   . Iron deficiency anemia 01/29/2018  . Pulmonary embolus ([place]) 01/09/2018  . Encounter for therapeutic drug monitoring 01/09/2018  . Severe malnutrition ([place]) 08/24/2017  . Acute kidney injury superimposed on CKD ([place]) 12/28/2016  . Hx of skin graft 08/01/2016  . 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 ([employer]) 07/25/2016  . 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 Trichomonal infection 11/24/2015  . Abdominal pain 05/02/2015  . Symptomatic cholelithiasis 04/16/2015  . Acute bilateral upper abdominal pain 04/16/2015   . Microcytic anemia 05/18/2013  . Hypotension, unspecified 05/17/2013  . CKD (chronic kidney disease), stage III ([place]) 05/17/2013  . Hypokalemia 05/17/2013  . Anal fissure 02/06/2013  . Anal skin tag 02/06/2013  . ALLERGIC RHINITIS 03/05/2010  . LOW BACK PAIN SYNDROME 12/13/2007  . OBESITY, MORBID 12/02/2006  . HTN (hypertension) 12/02/2006  . History of pulmonary embolism 12/02/2006  . History of DVT (deep vein thrombosis) 12/02/2006  . 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 GASTROESOPHAGEAL REFLUX DISEASE 12/02/2006  . OSA (obstructive sleep apnea) 12/02/2006   Past Medical History:  Diagnosis Date  . Anemia   . Anxiety   . Arthritis   . Chronic bronchitis ([place])   . Chronic upper back pain   . Depression   . DVT (deep venous thrombosis) (HCC)    BLE  . GERD (gastroesophageal reflux disease)   . Headache    "weekly" (04/16/2015)  . Hypertension   . Migraine    "monthly" (04/16/2015)  . Morbid obesity with BMI of 50.0-59.9, adult ([place])   . Obesity   . Pneumonia 2014  . Pulmonary embolism ([place])   . Sinusitis nasal   . Sleep apnea    "I'm suppose to wear a mask but I don't" (04/16/2015)  . Varicose veins of right lower extremity   . Venous stasis of lower extremity    right  . Wears dentures   . Wears glasses     Family History  Problem Relation Age of Onset  . Kidney disease Mother        kidney transplant    Past Surgical History:  Procedure Laterality Date  . CHOLECYSTECTOMY N/A 04/18/2015   Procedure: LAPAROSCOPIC CHOLECYSTECTOMY;  Surgeon: Ralene Ok, MD;  Location: [facility];  Service: General;  Laterality: N/A;  . HYSTEROSCOPY W/D&C  12/24/2001   Archie Endo 09/28/2010  . I&D EXTREMITY Right 07/25/2016   Procedure: IRRIGATION AND DEBRIDEMENT RIGHT LEG ULCER, APPLY VERAFLO VAC;  Surgeon: [name], MD;  Location: [facility];  Service: Orthopedics;  Laterality: Right;  . I&D EXTREMITY Right 09/28/2018   Procedure: DEBRIDEMENT RIGHT LOWER LEG WITH  PLACEMENT WITH PLACEMENT OF SKIN GRAFT AND VAC;  Surgeon: [name], MD;  Location: [facility];  Service: Orthopedics;  Laterality: Right;  . I&D EXTREMITY Right 10/03/2018   Procedure: REPEAT IRRIGATION AND DEBRIDEMENT RIGHT LOWER LEG, VAC PLACEMENT;  Surgeon: [name], MD;  Location: [facility];  Service: Orthopedics;  Laterality: Right;  . INCISE AND DRAIN ABCESS Right 07/14/2016  . INCISION AND DRAINAGE Right 09/10/2008   leg:  skin and soft tissue and muscle/notes 09/15/2010  . INCISION AND DRAINAGE Right 01/01/2008   Chronic venous stasis insufficiency ulcer,/notes 09/14/2010/  . INCISION AND DRAINAGE Right [id number]   calf w/application wound vac/notes 07/20/2010  .  INCISION AND DRAINAGE Right 07/25/2016   IRRIGATION AND DEBRIDEMENT RIGHT LEG ULCER,  . LAPAROSCOPIC GASTRIC BYPASS  ~ 2007  . MULTIPLE TOOTH EXTRACTIONS    . SKIN GRAFT SPLIT THICKNESS LEG / FOOT Right 07/25/2016   LEG  . SKIN SPLIT GRAFT Right 07/27/2016   Procedure: SKIN GRAFT RIGHT LEG WITH THERASKIN APPLICATION;  Surgeon: [name], MD;  Location: [facility];  Service: Orthopedics;  Laterality: Right;  . TONSILLECTOMY     Social History   Occupational History  . Not on file  Tobacco Use  . Smoking status: Never Smoker  . Smokeless tobacco: Never Used  Substance and Sexual Activity  . Alcohol use: No  . Drug use: No  . Sexual activity: Yes    Partners: Male    Birth control/protection: I.U.D.

## 2019-01-30 ENCOUNTER — Other Ambulatory Visit: Payer: Self-pay | Admitting: Orthopedic Surgery

## 2019-01-30 ENCOUNTER — Telehealth: Payer: Self-pay | Admitting: Family

## 2019-01-30 DIAGNOSIS — Z945 Skin transplant status: Secondary | ICD-10-CM

## 2019-01-30 DIAGNOSIS — I87331 Chronic venous hypertension (idiopathic) with ulcer and inflammation of right lower extremity: Secondary | ICD-10-CM

## 2019-01-30 MED ORDER — OXYCODONE HCL 10 MG PO TABS: 10.0000 mg | ORAL_TABLET | Freq: Two times a day (BID) | ORAL | 0 refills | Status: DC | PRN

## 2019-01-30 NOTE — Telephone Encounter (Signed)
rx sent

## 2019-01-30 NOTE — Telephone Encounter (Signed)
Were you going to call in an rx for the pt yesterday for pain medication?

## 2019-01-30 NOTE — Telephone Encounter (Signed)
Patient called this morning stating that her pharmacy did not receive her RX that Junie Panning was going to send in for her.  CB#[phone].  Thank you.

## 2019-02-06 ENCOUNTER — Ambulatory Visit: Payer: Medicare Other | Admitting: Family

## 2019-02-06 ENCOUNTER — Telehealth: Payer: Self-pay

## 2019-02-06 DIAGNOSIS — Z945 Skin transplant status: Secondary | ICD-10-CM

## 2019-02-06 DIAGNOSIS — I87331 Chronic venous hypertension (idiopathic) with ulcer and inflammation of right lower extremity: Secondary | ICD-10-CM

## 2019-02-06 MED ORDER — OXYCODONE HCL 10 MG PO TABS: 10.0000 mg | ORAL_TABLET | Freq: Two times a day (BID) | ORAL | 0 refills | Status: DC | PRN

## 2019-02-06 NOTE — Telephone Encounter (Signed)
Pt would like a refill on her Oxycodone 10 mg she states that she is only getting a 5 day supply and that she is hurting had a fall this weekend on her left leg and is having pain.

## 2019-02-06 NOTE — Addendum Note (Signed)
Addended by: Dondra Prader R on: 02/06/2019 10:52 AM   Modules accepted: Orders

## 2019-02-07 DIAGNOSIS — M1990 Unspecified osteoarthritis, unspecified site: Secondary | ICD-10-CM | POA: Diagnosis not present

## 2019-02-07 DIAGNOSIS — M546 Pain in thoracic spine: Secondary | ICD-10-CM | POA: Diagnosis not present

## 2019-02-07 DIAGNOSIS — L97211 Non-pressure chronic ulcer of right calf limited to breakdown of skin: Secondary | ICD-10-CM | POA: Diagnosis not present

## 2019-02-07 DIAGNOSIS — M6281 Muscle weakness (generalized): Secondary | ICD-10-CM | POA: Diagnosis not present

## 2019-02-07 DIAGNOSIS — G8929 Other chronic pain: Secondary | ICD-10-CM | POA: Diagnosis not present

## 2019-02-07 DIAGNOSIS — I83012 Varicose veins of right lower extremity with ulcer of calf: Secondary | ICD-10-CM | POA: Diagnosis not present

## 2019-02-12 ENCOUNTER — Ambulatory Visit (INDEPENDENT_AMBULATORY_CARE_PROVIDER_SITE_OTHER): Payer: Medicare Other | Admitting: Family

## 2019-02-12 ENCOUNTER — Encounter: Payer: Self-pay | Admitting: Family

## 2019-02-12 ENCOUNTER — Ambulatory Visit: Payer: Medicare Other | Admitting: Family

## 2019-02-12 VITALS — Ht 66.0 in | Wt 304.0 lb

## 2019-02-12 DIAGNOSIS — L97919 Non-pressure chronic ulcer of unspecified part of right lower leg with unspecified severity: Secondary | ICD-10-CM | POA: Diagnosis not present

## 2019-02-12 DIAGNOSIS — I87331 Chronic venous hypertension (idiopathic) with ulcer and inflammation of right lower extremity: Secondary | ICD-10-CM | POA: Diagnosis not present

## 2019-02-12 DIAGNOSIS — Z945 Skin transplant status: Secondary | ICD-10-CM

## 2019-02-12 NOTE — Progress Notes (Signed)
Office Visit Note   Patient: Alice [name]           Date of Birth: 04-Oct-1970           MRN: [id number] Visit Date: 02/12/2019              Requested by: Benito Mccreedy, MD [place] [id number] [place],  [place] [address] PCP: Benito Mccreedy, MD  Chief Complaint  Patient presents with  . Right Leg - Follow-up    09/28/18 STSG LLE      HPI: Patient is a 48 year old woman with chronic venous insufficiency ulcer right leg. Is status post skin graft. Continues to be pleased with the improvement of her ulcer. Has been doing some dressing changes herself between visits.   Assessment & Plan: Visit Diagnoses:  1. 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 Hx of skin graft     Plan: Patient's wound bed continues to improve we will continue with the 3 layer compression wrap, vaseline gauze against wound bed.  Follow up in office in 2 weeks.  Follow-Up Instructions: Return in about 2 weeks (around 02/26/2019).   Ortho Exam  Patient is alert, oriented, no adenopathy, well-dressed, normal affect, normal respiratory effort. Examination of the right leg patient has excellent improvement in the wound bed there is good epithelization around the wound edges. There is granulation tissue and surrounding dried drainage. No active drainage. No surrounding erythema.  Imaging: No results found. No images are attached to the encounter.  Labs: Lab Results  Component Value Date   REPTSTATUS 10/06/2018 FINAL 10/05/2018   GRAMSTAIN  09/28/2018    FEW WBC PRESENT, PREDOMINANTLY PMN FEW GRAM POSITIVE COCCI IN PAIRS FEW GRAM POSITIVE RODS    CULT 20,000 COLONIES/mL YEAST (A) 10/05/2018   LABORGA PROTEUS MIRABILIS 09/28/2018   LABORGA STAPHYLOCOCCUS CAPRAE 09/28/2018   LABORGA SERRATIA MARCESCENS 09/28/2018     Lab Results  Component Value Date   ALBUMIN 2.5 (L) 10/08/2018   ALBUMIN 3.2 (L) 01/29/2018   ALBUMIN 2.2  (L) 01/03/2017    Lab Results  Component Value Date   MG 1.8 12/29/2016   MG 1.9 12/28/2016   MG 1.9 05/17/2013   No results found for: VD25OH  No results found for: PREALBUMIN CBC EXTENDED Latest Ref Rng & Units 10/13/2018 10/12/2018 10/12/2018  WBC 4.0 - 10.5 K/uL 8.4 - 7.8  RBC 3.87 - 5.11 MIL/uL 3.92 2.91(L) 2.70(L)  HGB 12.0 - 15.0 g/dL 9.9(L) 7.4(L) 6.6(LL)  HCT 36.0 - 46.0 % 30.3(L) 22.7(L) 20.6(L)  PLT 150 - 400 K/uL 389 - 333  NEUTROABS 1.7 - 7.7 K/uL - - -  LYMPHSABS 0.7 - 4.0 K/uL - - -     Body mass index is 49.07 kg/m.  Orders:  No orders of the defined types were placed in this encounter.  No orders of the defined types were placed in this encounter.    Procedures: No procedures performed  Clinical Data: No additional findings.  ROS:  All other systems negative, except as noted in the HPI. Review of Systems  Constitutional: Negative for chills and fever.  Skin: Positive for wound. Negative for color change.    Objective: Vital Signs: Ht 5\' 6"  (1.676 m)   Wt (!) 304 lb (137.9 kg)   BMI 49.07 kg/m   Specialty Comments:  No specialty comments available.  PMFS History: Patient Active Problem List   Diagnosis Date Noted  . Subtherapeutic  international normalized ratio (INR)   . Postoperative pain   . Hypoalbuminemia   . Acute on chronic kidney failure ([place])   . Acute blood loss anemia   . Benign essential HTN   . Debility 10/05/2018  . Chronic ulcer of right leg, with fat layer exposed ([place]) 09/28/2018  . Chronic ulcer of leg, right, with necrosis of muscle ([place])   . Iron deficiency anemia 01/29/2018  . Pulmonary embolus ([place]) 01/09/2018  . Encounter for therapeutic drug monitoring 01/09/2018  . Severe malnutrition ([place]) 08/24/2017  . Acute kidney injury superimposed on CKD ([place]) 12/28/2016  . Hx of skin graft 08/01/2016  . 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 ([place]) 07/25/2016  . Idiopathic chronic venous  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 ([place]) 07/05/2016  . Trichomonal infection 11/24/2015  . Abdominal pain 05/02/2015  . Symptomatic cholelithiasis 04/16/2015  . Acute bilateral upper abdominal pain 04/16/2015  . Microcytic anemia 05/18/2013  . Hypotension, unspecified 05/17/2013  . CKD (chronic kidney disease), stage III ([place]) 05/17/2013  . Hypokalemia 05/17/2013  . Anal fissure 02/06/2013  . Anal skin tag 02/06/2013  . ALLERGIC RHINITIS 03/05/2010  . LOW BACK PAIN SYNDROME 12/13/2007  . OBESITY, MORBID 12/02/2006  . HTN (hypertension) 12/02/2006  . History of pulmonary embolism 12/02/2006  . History of DVT (deep vein thrombosis) 12/02/2006  . 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 GASTROESOPHAGEAL REFLUX DISEASE 12/02/2006  . OSA (obstructive sleep apnea) 12/02/2006   Past Medical History:  Diagnosis Date  . Anemia   . Anxiety   . Arthritis   . Chronic bronchitis ([place])   . Chronic upper back pain   . Depression   . DVT (deep venous thrombosis) (HCC)    BLE  . GERD (gastroesophageal reflux disease)   . Headache    "weekly" (04/16/2015)  . Hypertension   . Migraine    "monthly" (04/16/2015)  . Morbid obesity with BMI of 50.0-59.9, adult ([place])   . Obesity   . Pneumonia 2014  . Pulmonary embolism ([place])   . Sinusitis nasal   . Sleep apnea    "I'm suppose to wear a mask but I don't" (04/16/2015)  . Varicose veins of right lower extremity   . Venous stasis of lower extremity    right  . Wears dentures   . Wears glasses     Family History  Problem Relation Age of Onset  . Kidney disease Mother        kidney transplant    Past Surgical History:  Procedure Laterality Date  . CHOLECYSTECTOMY N/A 04/18/2015   Procedure: LAPAROSCOPIC CHOLECYSTECTOMY;  Surgeon: Ralene Ok, MD;  Location: [facility];  Service: General;  Laterality: N/A;  . HYSTEROSCOPY W/D&C  12/24/2001   Archie Endo 09/28/2010  . I&D EXTREMITY Right 07/25/2016    Procedure: IRRIGATION AND DEBRIDEMENT RIGHT LEG ULCER, APPLY VERAFLO VAC;  Surgeon: [name], MD;  Location: [facility];  Service: Orthopedics;  Laterality: Right;  . I&D EXTREMITY Right 09/28/2018   Procedure: DEBRIDEMENT RIGHT LOWER LEG WITH PLACEMENT WITH PLACEMENT OF SKIN GRAFT AND VAC;  Surgeon: [name], MD;  Location: [facility];  Service: Orthopedics;  Laterality: Right;  . I&D EXTREMITY Right 10/03/2018   Procedure: REPEAT IRRIGATION AND DEBRIDEMENT RIGHT LOWER LEG, VAC PLACEMENT;  Surgeon: [name], MD;  Location[place] [facility];  Service: Orthopedics;  Laterality: Right;  . INCISE AND DRAIN ABCESS Right 07/14/2016  . INCISION AND DRAINAGE Right 09/10/2008  leg:  skin and soft tissue and muscle/notes 09/15/2010  . INCISION AND DRAINAGE Right 01/01/2008   Chronic venous stasis insufficiency ulcer,/notes 09/14/2010/  . INCISION AND DRAINAGE Right 09/91   calf w/application wound vac/notes 07/20/2010  . INCISION AND DRAINAGE Right 07/25/2016   IRRIGATION AND DEBRIDEMENT RIGHT LEG ULCER,  . LAPAROSCOPIC GASTRIC BYPASS  ~ 2007  . MULTIPLE TOOTH EXTRACTIONS    . SKIN GRAFT SPLIT THICKNESS LEG / FOOT Right 07/25/2016   LEG  . SKIN SPLIT GRAFT Right 07/27/2016   Procedure: SKIN GRAFT RIGHT LEG WITH THERASKIN APPLICATION;  Surgeon: [name], MD;  Location: [facility];  Service: Orthopedics;  Laterality: Right;  . TONSILLECTOMY     Social History   Occupational History  . Not on file  Tobacco Use  . Smoking status: Never Smoker  . Smokeless tobacco: Never Used  Substance and Sexual Activity  . Alcohol use: No  . Drug use: No  . Sexual activity: Yes    Partners: Male    Birth control/protection: I.U.D.

## 2019-02-18 ENCOUNTER — Ambulatory Visit: Payer: Medicare Other | Admitting: Physical Medicine & Rehabilitation

## 2019-02-18 ENCOUNTER — Telehealth: Payer: Self-pay | Admitting: Family

## 2019-02-18 ENCOUNTER — Other Ambulatory Visit: Payer: Self-pay | Admitting: Orthopedic Surgery

## 2019-02-18 DIAGNOSIS — L97919 Non-pressure chronic ulcer of unspecified part of right lower leg with unspecified severity: Secondary | ICD-10-CM

## 2019-02-18 DIAGNOSIS — I87331 Chronic venous hypertension (idiopathic) with ulcer and inflammation of right lower extremity: Secondary | ICD-10-CM

## 2019-02-18 DIAGNOSIS — Z945 Skin transplant status: Secondary | ICD-10-CM

## 2019-02-18 MED ORDER — OXYCODONE HCL 10 MG PO TABS: 10.0000 mg | ORAL_TABLET | Freq: Two times a day (BID) | ORAL | 0 refills | Status: DC | PRN

## 2019-02-18 NOTE — Telephone Encounter (Signed)
Patient called advised her Rx for (oxycodone) was not sent to the pharmacy. The number to contact patient is [phone]

## 2019-02-18 NOTE — Telephone Encounter (Signed)
Dr Duda, please advise, thank you 

## 2019-02-18 NOTE — Telephone Encounter (Signed)
rx sent now

## 2019-02-20 ENCOUNTER — Encounter: Payer: Medicare Other | Attending: Physical Medicine & Rehabilitation | Admitting: Physical Medicine & Rehabilitation

## 2019-02-20 DIAGNOSIS — G8918 Other acute postprocedural pain: Secondary | ICD-10-CM | POA: Insufficient documentation

## 2019-02-20 DIAGNOSIS — L97913 Non-pressure chronic ulcer of unspecified part of right lower leg with necrosis of muscle: Secondary | ICD-10-CM | POA: Insufficient documentation

## 2019-02-20 DIAGNOSIS — R5381 Other malaise: Secondary | ICD-10-CM | POA: Insufficient documentation

## 2019-02-20 DIAGNOSIS — D508 Other iron deficiency anemias: Secondary | ICD-10-CM | POA: Insufficient documentation

## 2019-02-20 DIAGNOSIS — D62 Acute posthemorrhagic anemia: Secondary | ICD-10-CM | POA: Insufficient documentation

## 2019-02-20 DIAGNOSIS — I10 Essential (primary) hypertension: Secondary | ICD-10-CM | POA: Insufficient documentation

## 2019-02-26 ENCOUNTER — Other Ambulatory Visit: Payer: Self-pay

## 2019-02-26 ENCOUNTER — Ambulatory Visit (INDEPENDENT_AMBULATORY_CARE_PROVIDER_SITE_OTHER): Payer: Medicare Other | Admitting: Family

## 2019-02-26 ENCOUNTER — Encounter: Payer: Self-pay | Admitting: Family

## 2019-02-26 VITALS — Ht 66.0 in | Wt 304.0 lb

## 2019-02-26 DIAGNOSIS — L97919 Non-pressure chronic ulcer of unspecified part of right lower leg with unspecified severity: Secondary | ICD-10-CM

## 2019-02-26 DIAGNOSIS — Z945 Skin transplant status: Secondary | ICD-10-CM

## 2019-02-26 DIAGNOSIS — I87331 Chronic venous hypertension (idiopathic) with ulcer and inflammation of right lower extremity: Secondary | ICD-10-CM

## 2019-02-26 NOTE — Progress Notes (Signed)
Office Visit Note   Patient: Alice [name]           Date of Birth: 1971-02-25           MRN: [id number] Visit Date: 02/26/2019              Requested by: Benito Mccreedy, MD [place] [id number] [place],  [place] [id number] PCP: Benito Mccreedy, MD  Chief Complaint  Patient presents with  . Right Leg - Routine Post Op    10/03/2018 Repeat I&D SG      HPI: Patient is a 48 year old woman with chronic venous insufficiency ulcer right leg. Is status post skin graft. Continues to be pleased with the improvement of her ulcer. Has been doing some dressing changes herself between visits. Seeing [place] every other week.   Assessment & Plan: Visit Diagnoses:  No diagnosis found.  Plan: Patient's wound bed continues to improve we will continue with the 3 layer compression wrap, vaseline gauze against wound bed.  Follow up in office in 2 weeks.  Follow-Up Instructions: No follow-ups on file.   Ortho Exam  Patient is alert, oriented, no adenopathy, well-dressed, normal affect, normal respiratory effort. Examination of the right leg patient has excellent improvement in the wound bed there is increased epithelization around the wound edges. There is granulation tissue and surrounding dried drainage. No active drainage. No surrounding erythema.  Patient declines debridement today.  Imaging: No results found. No images are attached to the encounter.  Labs: Lab Results  Component Value Date   REPTSTATUS 10/06/2018 FINAL 10/05/2018   GRAMSTAIN  09/28/2018    FEW WBC PRESENT, PREDOMINANTLY PMN FEW GRAM POSITIVE COCCI IN PAIRS FEW GRAM POSITIVE RODS    CULT 20,000 COLONIES/mL YEAST (A) 10/05/2018   LABORGA PROTEUS MIRABILIS 09/28/2018   LABORGA STAPHYLOCOCCUS CAPRAE 09/28/2018   LABORGA SERRATIA MARCESCENS 09/28/2018     Lab Results  Component Value Date   ALBUMIN 2.5 (L) 10/08/2018   ALBUMIN 3.2 (L) 01/29/2018   ALBUMIN 2.2 (L) 01/03/2017    Lab Results  Component  Value Date   MG 1.8 12/29/2016   MG 1.9 12/28/2016   MG 1.9 05/17/2013   No results found for: VD25OH  No results found for: PREALBUMIN CBC EXTENDED Latest Ref Rng & Units 10/13/2018 10/12/2018 10/12/2018  WBC 4.0 - 10.5 K/uL 8.4 - 7.8  RBC 3.87 - 5.11 MIL/uL 3.92 2.91(L) 2.70(L)  HGB 12.0 - 15.0 g/dL 9.9(L) 7.4(L) 6.6(LL)  HCT 36.0 - 46.0 % 30.3(L) 22.7(L) 20.6(L)  PLT 150 - 400 K/uL 389 - 333  NEUTROABS 1.7 - 7.7 K/uL - - -  LYMPHSABS 0.7 - 4.0 K/uL - - -     Body mass index is 49.07 kg/m.  Orders:  No orders of the defined types were placed in this encounter.  No orders of the defined types were placed in this encounter.    Procedures: No procedures performed  Clinical Data: No additional findings.  ROS:  All other systems negative, except as noted in the HPI. Review of Systems  Constitutional: Negative for chills and fever.  Skin: Positive for wound. Negative for color change.    Objective: Vital Signs: Ht 5\' 6"  (1.676 m)   Wt (!) 304 lb (137.9 kg)   BMI 49.07 kg/m   Specialty Comments:  No specialty comments available.  PMFS History: Patient Active Problem List   Diagnosis Date Noted  . Subtherapeutic international normalized ratio (INR)   . Postoperative pain   .  Hypoalbuminemia   . Acute on chronic kidney failure ([place])   . Acute blood loss anemia   . Benign essential HTN   . Debility 10/05/2018  . Chronic ulcer of right leg, with fat layer exposed ([place]) 09/28/2018  . Chronic ulcer of leg, right, with necrosis of muscle ([place])   . Iron deficiency anemia 01/29/2018  . Pulmonary embolus ([place]) 01/09/2018  . Encounter for therapeutic drug monitoring 01/09/2018  . Severe malnutrition ([place]) 08/24/2017  . Acute kidney injury superimposed on CKD ([place]) 12/28/2016  . Hx of skin graft 08/01/2016  . 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 ([place]) 07/25/2016  . Idiopathic chronic venous hypertension of right lower extremity with ulcer and  inflammation ([place]) 07/05/2016  . Trichomonal infection 11/24/2015  . Abdominal pain 05/02/2015  . Symptomatic cholelithiasis 04/16/2015  . Acute bilateral upper abdominal pain 04/16/2015  . Microcytic anemia 05/18/2013  . Hypotension, unspecified 05/17/2013  . CKD (chronic kidney disease), stage III 05/17/2013  . Hypokalemia 05/17/2013  . Anal fissure 02/06/2013  . Anal skin tag 02/06/2013  . ALLERGIC RHINITIS 03/05/2010  . LOW BACK PAIN SYNDROME 12/13/2007  . OBESITY, MORBID 12/02/2006  . HTN (hypertension) 12/02/2006  . History of pulmonary embolism 12/02/2006  . History of DVT (deep vein thrombosis) 12/02/2006  . 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 GASTROESOPHAGEAL REFLUX DISEASE 12/02/2006  . OSA (obstructive sleep apnea) 12/02/2006   Past Medical History:  Diagnosis Date  . Anemia   . Anxiety   . Arthritis   . Chronic bronchitis ([place])   . Chronic upper back pain   . Depression   . DVT (deep venous thrombosis) (HCC)    BLE  . GERD (gastroesophageal reflux disease)   . Headache    "weekly" (04/16/2015)  . Hypertension   . Migraine    "monthly" (04/16/2015)  . Morbid obesity with BMI of 50.0-59.9, adult ([place])   . Obesity   . Pneumonia 2014  . Pulmonary embolism ([place])   . Sinusitis nasal   . Sleep apnea    "I'm suppose to wear a mask but I don't" (04/16/2015)  . Varicose veins of right lower extremity   . Venous stasis of lower extremity    right  . Wears dentures   . Wears glasses     Family History  Problem Relation Age of Onset  . Kidney disease Mother        kidney transplant    Past Surgical History:  Procedure Laterality Date  . CHOLECYSTECTOMY N/A 04/18/2015   Procedure: LAPAROSCOPIC CHOLECYSTECTOMY;  Surgeon: Ralene Ok, MD;  Location: [facility];  Service: General;  Laterality: N/A;  . HYSTEROSCOPY W/D&C  12/24/2001   Archie Endo 09/28/2010  . I&D EXTREMITY Right 07/25/2016   Procedure: IRRIGATION AND DEBRIDEMENT RIGHT LEG ULCER, APPLY  VERAFLO VAC;  Surgeon: [name], MD;  Location: [facility];  Service: Orthopedics;  Laterality: Right;  . I&D EXTREMITY Right 09/28/2018   Procedure: DEBRIDEMENT RIGHT LOWER LEG WITH PLACEMENT WITH PLACEMENT OF SKIN GRAFT AND VAC;  Surgeon: [name], MD;  Location: [facility];  Service: Orthopedics;  Laterality: Right;  . I&D EXTREMITY Right 10/03/2018   Procedure: REPEAT IRRIGATION AND DEBRIDEMENT RIGHT LOWER LEG, VAC PLACEMENT;  Surgeon: [name], MD;  Location: [facility];  Service: Orthopedics;  Laterality: Right;  . INCISE AND DRAIN ABCESS Right 07/14/2016  . INCISION AND DRAINAGE Right 09/10/2008   leg:  skin and soft tissue and muscle/notes 09/15/2010  .  INCISION AND DRAINAGE Right 01/01/2008   Chronic venous stasis insufficiency ulcer,/notes 09/14/2010/  . INCISION AND DRAINAGE Right [id number]   calf w/application wound vac/notes 07/20/2010  . INCISION AND DRAINAGE Right 07/25/2016   IRRIGATION AND DEBRIDEMENT RIGHT LEG ULCER,  . LAPAROSCOPIC GASTRIC BYPASS  ~ 2007  . MULTIPLE TOOTH EXTRACTIONS    . SKIN GRAFT SPLIT THICKNESS LEG / FOOT Right 07/25/2016   LEG  . SKIN SPLIT GRAFT Right 07/27/2016   Procedure: SKIN GRAFT RIGHT LEG WITH THERASKIN APPLICATION;  Surgeon: [name], MD;  Location: [facility];  Service: Orthopedics;  Laterality: Right;  . TONSILLECTOMY     Social History   Occupational History  . Not on file  Tobacco Use  . Smoking status: Never Smoker  . Smokeless tobacco: Never Used  Substance and Sexual Activity  . Alcohol use: No  . Drug use: No  . Sexual activity: Yes    Partners: Male    Birth control/protection: I.U.D.

## 2019-02-28 ENCOUNTER — Other Ambulatory Visit: Payer: Self-pay | Admitting: Orthopedic Surgery

## 2019-02-28 ENCOUNTER — Telehealth: Payer: Self-pay | Admitting: Family

## 2019-02-28 DIAGNOSIS — L97919 Non-pressure chronic ulcer of unspecified part of right lower leg with unspecified severity: Secondary | ICD-10-CM

## 2019-02-28 DIAGNOSIS — I87331 Chronic venous hypertension (idiopathic) with ulcer and inflammation of right lower extremity: Secondary | ICD-10-CM

## 2019-02-28 DIAGNOSIS — Z945 Skin transplant status: Secondary | ICD-10-CM

## 2019-02-28 MED ORDER — OXYCODONE HCL 10 MG PO TABS: 10.0000 mg | ORAL_TABLET | Freq: Two times a day (BID) | ORAL | 0 refills | Status: DC | PRN

## 2019-02-28 NOTE — Telephone Encounter (Signed)
Dr Sharol Given please advise.

## 2019-02-28 NOTE — Telephone Encounter (Signed)
Pt called in said she came in to see Alice [name] on 02-26-2019 and she was suppose to refill her oxycodone. Please send that to [facility] on [address].

## 2019-02-28 NOTE — Telephone Encounter (Signed)
rx written

## 2019-03-06 ENCOUNTER — Other Ambulatory Visit: Payer: Self-pay | Admitting: Internal Medicine

## 2019-03-06 DIAGNOSIS — Z1231 Encounter for screening mammogram for malignant neoplasm of breast: Secondary | ICD-10-CM

## 2019-03-07 ENCOUNTER — Ambulatory Visit
Admission: RE | Admit: 2019-03-07 | Discharge: 2019-03-07 | Disposition: A | Discharge status: Home / Self Care | Payer: Medicare Other | Source: Ambulatory Visit | Attending: Internal Medicine | Admitting: Internal Medicine

## 2019-03-07 ENCOUNTER — Other Ambulatory Visit: Payer: Self-pay

## 2019-03-07 ENCOUNTER — Other Ambulatory Visit: Payer: Self-pay | Admitting: Internal Medicine

## 2019-03-07 DIAGNOSIS — Z1231 Encounter for screening mammogram for malignant neoplasm of breast: Secondary | ICD-10-CM | POA: Diagnosis not present

## 2019-03-08 DIAGNOSIS — Z124 Encounter for screening for malignant neoplasm of cervix: Secondary | ICD-10-CM | POA: Diagnosis not present

## 2019-03-08 DIAGNOSIS — G629 Polyneuropathy, unspecified: Secondary | ICD-10-CM | POA: Diagnosis not present

## 2019-03-08 DIAGNOSIS — R7303 Prediabetes: Secondary | ICD-10-CM | POA: Diagnosis not present

## 2019-03-08 DIAGNOSIS — R600 Localized edema: Secondary | ICD-10-CM | POA: Diagnosis not present

## 2019-03-08 DIAGNOSIS — R1013 Epigastric pain: Secondary | ICD-10-CM | POA: Diagnosis not present

## 2019-03-08 DIAGNOSIS — I10 Essential (primary) hypertension: Secondary | ICD-10-CM | POA: Diagnosis not present

## 2019-03-08 DIAGNOSIS — G4733 Obstructive sleep apnea (adult) (pediatric): Secondary | ICD-10-CM | POA: Diagnosis not present

## 2019-03-08 DIAGNOSIS — D508 Other iron deficiency anemias: Secondary | ICD-10-CM | POA: Diagnosis not present

## 2019-03-08 DIAGNOSIS — F329 Major depressive disorder, single episode, unspecified: Secondary | ICD-10-CM | POA: Diagnosis not present

## 2019-03-08 DIAGNOSIS — E559 Vitamin D deficiency, unspecified: Secondary | ICD-10-CM | POA: Diagnosis not present

## 2019-03-12 ENCOUNTER — Ambulatory Visit: Payer: Medicare Other | Admitting: Family

## 2019-03-13 ENCOUNTER — Telehealth: Payer: Self-pay

## 2019-03-13 ENCOUNTER — Other Ambulatory Visit: Payer: Self-pay | Admitting: Orthopedic Surgery

## 2019-03-13 DIAGNOSIS — Z945 Skin transplant status: Secondary | ICD-10-CM

## 2019-03-13 DIAGNOSIS — L97919 Non-pressure chronic ulcer of unspecified part of right lower leg with unspecified severity: Secondary | ICD-10-CM

## 2019-03-13 DIAGNOSIS — I87331 Chronic venous hypertension (idiopathic) with ulcer and inflammation of right lower extremity: Secondary | ICD-10-CM

## 2019-03-13 MED ORDER — OXYCODONE HCL 10 MG PO TABS: 10.0000 mg | ORAL_TABLET | Freq: Two times a day (BID) | ORAL | 0 refills | Status: DC | PRN

## 2019-03-13 NOTE — Telephone Encounter (Signed)
Pt stopped into the office today to make a request for refill on her pain medication. Please advise.

## 2019-03-13 NOTE — Telephone Encounter (Signed)
rx written

## 2019-03-15 DIAGNOSIS — D508 Other iron deficiency anemias: Secondary | ICD-10-CM | POA: Diagnosis not present

## 2019-03-15 DIAGNOSIS — F329 Major depressive disorder, single episode, unspecified: Secondary | ICD-10-CM | POA: Diagnosis not present

## 2019-03-15 DIAGNOSIS — R7303 Prediabetes: Secondary | ICD-10-CM | POA: Diagnosis not present

## 2019-03-15 DIAGNOSIS — Z0001 Encounter for general adult medical examination with abnormal findings: Secondary | ICD-10-CM | POA: Diagnosis not present

## 2019-03-15 DIAGNOSIS — Z1389 Encounter for screening for other disorder: Secondary | ICD-10-CM | POA: Diagnosis not present

## 2019-03-15 DIAGNOSIS — I10 Essential (primary) hypertension: Secondary | ICD-10-CM | POA: Diagnosis not present

## 2019-03-15 DIAGNOSIS — R1013 Epigastric pain: Secondary | ICD-10-CM | POA: Diagnosis not present

## 2019-03-15 DIAGNOSIS — G4733 Obstructive sleep apnea (adult) (pediatric): Secondary | ICD-10-CM | POA: Diagnosis not present

## 2019-03-15 DIAGNOSIS — E559 Vitamin D deficiency, unspecified: Secondary | ICD-10-CM | POA: Diagnosis not present

## 2019-03-15 DIAGNOSIS — R600 Localized edema: Secondary | ICD-10-CM | POA: Diagnosis not present

## 2019-03-15 DIAGNOSIS — G629 Polyneuropathy, unspecified: Secondary | ICD-10-CM | POA: Diagnosis not present

## 2019-03-19 ENCOUNTER — Encounter: Payer: Self-pay | Admitting: Family

## 2019-03-19 ENCOUNTER — Ambulatory Visit (INDEPENDENT_AMBULATORY_CARE_PROVIDER_SITE_OTHER): Payer: Medicare Other | Admitting: Family

## 2019-03-19 ENCOUNTER — Other Ambulatory Visit: Payer: Self-pay

## 2019-03-19 DIAGNOSIS — I87331 Chronic venous hypertension (idiopathic) with ulcer and inflammation of right lower extremity: Secondary | ICD-10-CM

## 2019-03-19 DIAGNOSIS — L97919 Non-pressure chronic ulcer of unspecified part of right lower leg with unspecified severity: Secondary | ICD-10-CM

## 2019-03-19 DIAGNOSIS — Z945 Skin transplant status: Secondary | ICD-10-CM | POA: Diagnosis not present

## 2019-03-19 MED ORDER — OXYCODONE HCL 10 MG PO TABS: 10.0000 mg | ORAL_TABLET | Freq: Two times a day (BID) | ORAL | 0 refills | Status: DC | PRN

## 2019-03-19 NOTE — Progress Notes (Signed)
Office Visit Note   Patient: Alice [name]           Date of Birth: May 22, 1970           MRN: [id number] Visit Date: 03/19/2019              Requested by: Benito Mccreedy, MD [place] [id number] [place],  [place] [id number] PCP: Benito Mccreedy, MD  Chief Complaint  Patient presents with  . Right Leg - Follow-up    10/03/2018 RLE SG      HPI: Patient is a 48 year old woman with chronic venous insufficiency ulcer right leg. Is status post skin graft. Continues to be pleased with the improvement of her ulcer. Has been doing some dressing changes herself between visits. Does report some increase in her swelling.    Assessment & Plan: Visit Diagnoses:  1. Hx of skin graft   2. 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 (HCC)     Plan: Patient's wound bed continues to improve we will continue with the 3 layer compression wrap, Silver cell to wound for increased drainage.   Follow-Up Instructions: Return in about 3 weeks (around 04/09/2019).   Ortho Exam  Patient is alert, oriented, no adenopathy, well-dressed, normal affect, normal respiratory effort. Examination of the right leg patient has excellent improvement in the wound bed there is further epithelization around the wound edges. There is granulation tissue and surrounding dried drainage. No active drainage. No surrounding erythema.  Imaging: No results found. No images are attached to the encounter.  Labs: Lab Results  Component Value Date   REPTSTATUS 10/06/2018 FINAL 10/05/2018   GRAMSTAIN  09/28/2018    FEW WBC PRESENT, PREDOMINANTLY PMN FEW GRAM POSITIVE COCCI IN PAIRS FEW GRAM POSITIVE RODS    CULT 20,000 COLONIES/mL YEAST (A) 10/05/2018   LABORGA PROTEUS MIRABILIS 09/28/2018   LABORGA STAPHYLOCOCCUS CAPRAE 09/28/2018   LABORGA SERRATIA MARCESCENS 09/28/2018     Lab Results  Component Value Date   ALBUMIN 2.5 (L) 10/08/2018   ALBUMIN  3.2 (L) 01/29/2018   ALBUMIN 2.2 (L) 01/03/2017    Lab Results  Component Value Date   MG 1.8 12/29/2016   MG 1.9 12/28/2016   MG 1.9 05/17/2013   No results found for: VD25OH  No results found for: PREALBUMIN CBC EXTENDED Latest Ref Rng & Units 10/13/2018 10/12/2018 10/12/2018  WBC 4.0 - 10.5 K/uL 8.4 - 7.8  RBC 3.87 - 5.11 MIL/uL 3.92 2.91(L) 2.70(L)  HGB 12.0 - 15.0 g/dL 9.9(L) 7.4(L) 6.6(LL)  HCT 36.0 - 46.0 % 30.3(L) 22.7(L) 20.6(L)  PLT 150 - 400 K/uL 389 - 333  NEUTROABS 1.7 - 7.7 K/uL - - -  LYMPHSABS 0.7 - 4.0 K/uL - - -     Body mass index is 49.07 kg/m.  Orders:  No orders of the defined types were placed in this encounter.  Meds ordered this encounter  Medications  . Oxycodone HCl 10 MG TABS    Sig: Take 1 tablet (10 mg total) by mouth 2 (two) times daily as needed.    Dispense:  20 tablet    Refill:  0     Procedures: No procedures performed  Clinical Data: No additional findings.  ROS:  All other systems negative, except as noted in the HPI. Review of Systems  Constitutional: Negative for chills and fever.  Skin: Positive for wound. Negative for color change.    Objective: Vital Signs: Ht 5\' 6"  (1.676 m)  Wt (!) 304 lb (137.9 kg)   BMI 49.07 kg/m   Specialty Comments:  No specialty comments available.  PMFS History: Patient Active Problem List   Diagnosis Date Noted  . Subtherapeutic international normalized ratio (INR)   . Postoperative pain   . Hypoalbuminemia   . Acute on chronic kidney failure ([place])   . Acute blood loss anemia   . Benign essential HTN   . Debility 10/05/2018  . Chronic ulcer of right leg, with fat layer exposed ([place]) 09/28/2018  . Chronic ulcer of leg, right, with necrosis of muscle ([place])   . Iron deficiency anemia 01/29/2018  . Pulmonary embolus ([place]) 01/09/2018  . Encounter for therapeutic drug monitoring 01/09/2018  . Severe malnutrition ([place]) 08/24/2017  . Acute kidney injury superimposed on CKD ([place])  12/28/2016  . Hx of skin graft 08/01/2016  . 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 ([place]) 07/25/2016  . 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 ([place]) 07/05/2016  . Trichomonal infection 11/24/2015  . Abdominal pain 05/02/2015  . Symptomatic cholelithiasis 04/16/2015  . Acute bilateral upper abdominal pain 04/16/2015  . Microcytic anemia 05/18/2013  . Hypotension, unspecified 05/17/2013  . CKD (chronic kidney disease), stage III 05/17/2013  . Hypokalemia 05/17/2013  . Anal fissure 02/06/2013  . Anal skin tag 02/06/2013  . ALLERGIC RHINITIS 03/05/2010  . LOW BACK PAIN SYNDROME 12/13/2007  . OBESITY, MORBID 12/02/2006  . HTN (hypertension) 12/02/2006  . History of pulmonary embolism 12/02/2006  . History of DVT (deep vein thrombosis) 12/02/2006  . 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 GASTROESOPHAGEAL REFLUX DISEASE 12/02/2006  . OSA (obstructive sleep apnea) 12/02/2006   Past Medical History:  Diagnosis Date  . Anemia   . Anxiety   . Arthritis   . Chronic bronchitis ([place])   . Chronic upper back pain   . Depression   . DVT (deep venous thrombosis) (HCC)    BLE  . GERD (gastroesophageal reflux disease)   . Headache    "weekly" (04/16/2015)  . Hypertension   . Migraine    "monthly" (04/16/2015)  . Morbid obesity with BMI of 50.0-59.9, adult ([place])   . Obesity   . Pneumonia 2014  . Pulmonary embolism ([place])   . Sinusitis nasal   . Sleep apnea    "I'm suppose to wear a mask but I don't" (04/16/2015)  . Varicose veins of right lower extremity   . Venous stasis of lower extremity    right  . Wears dentures   . Wears glasses     Family History  Problem Relation Age of Onset  . Kidney disease Mother        kidney transplant    Past Surgical History:  Procedure Laterality Date  . CHOLECYSTECTOMY N/A 04/18/2015   Procedure: LAPAROSCOPIC CHOLECYSTECTOMY;  Surgeon: Ralene Ok, MD;   Location: [facility];  Service: General;  Laterality: N/A;  . HYSTEROSCOPY W/D&C  12/24/2001   Archie Endo 09/28/2010  . I&D EXTREMITY Right 07/25/2016   Procedure: IRRIGATION AND DEBRIDEMENT RIGHT LEG ULCER, APPLY VERAFLO VAC;  Surgeon: [name], MD;  Location: [facility];  Service: Orthopedics;  Laterality: Right;  . I&D EXTREMITY Right 09/28/2018   Procedure: DEBRIDEMENT RIGHT LOWER LEG WITH PLACEMENT WITH PLACEMENT OF SKIN GRAFT AND VAC;  Surgeon: [name], MD;  Location: [facility];  Service: Orthopedics;  Laterality: Right;  . I&D EXTREMITY Right 10/03/2018   Procedure: REPEAT IRRIGATION AND DEBRIDEMENT RIGHT LOWER  LEG, VAC PLACEMENT;  Surgeon: [name], MD;  Location: [facility];  Service: Orthopedics;  Laterality: Right;  . INCISE AND DRAIN ABCESS Right 07/14/2016  . INCISION AND DRAINAGE Right 09/10/2008   leg:  skin and soft tissue and muscle/notes 09/15/2010  . INCISION AND DRAINAGE Right 01/01/2008   Chronic venous stasis insufficiency ulcer,/notes 09/14/2010/  . INCISION AND DRAINAGE Right [id number]   calf w/application wound vac/notes 07/20/2010  . INCISION AND DRAINAGE Right 07/25/2016   IRRIGATION AND DEBRIDEMENT RIGHT LEG ULCER,  . LAPAROSCOPIC GASTRIC BYPASS  ~ 2007  . MULTIPLE TOOTH EXTRACTIONS    . SKIN GRAFT SPLIT THICKNESS LEG / FOOT Right 07/25/2016   LEG  . SKIN SPLIT GRAFT Right 07/27/2016   Procedure: SKIN GRAFT RIGHT LEG WITH THERASKIN APPLICATION;  Surgeon: [name], MD;  Location: [facility];  Service: Orthopedics;  Laterality: Right;  . TONSILLECTOMY     Social History   Occupational History  . Not on file  Tobacco Use  . Smoking status: Never Smoker  . Smokeless tobacco: Never Used  Substance and Sexual Activity  . Alcohol use: No  . Drug use: No  . Sexual activity: Yes    Partners: Male    Birth control/protection: I.U.D.

## 2019-04-02 ENCOUNTER — Ambulatory Visit: Payer: Medicare Other | Admitting: Family

## 2019-04-02 ENCOUNTER — Telehealth: Payer: Self-pay | Admitting: Family

## 2019-04-02 DIAGNOSIS — Z945 Skin transplant status: Secondary | ICD-10-CM

## 2019-04-02 DIAGNOSIS — I87331 Chronic venous hypertension (idiopathic) with ulcer and inflammation of right lower extremity: Secondary | ICD-10-CM

## 2019-04-02 DIAGNOSIS — L97919 Non-pressure chronic ulcer of unspecified part of right lower leg with unspecified severity: Secondary | ICD-10-CM

## 2019-04-02 MED ORDER — OXYCODONE HCL 10 MG PO TABS: 10.0000 mg | ORAL_TABLET | Freq: Two times a day (BID) | ORAL | 0 refills | Status: DC | PRN

## 2019-04-02 NOTE — Telephone Encounter (Signed)
Do you want to refill this medication?

## 2019-04-02 NOTE — Telephone Encounter (Signed)
Pt called in requesting a refill on her pain medication.   [phone]

## 2019-04-09 ENCOUNTER — Ambulatory Visit (INDEPENDENT_AMBULATORY_CARE_PROVIDER_SITE_OTHER): Payer: Medicare Other | Admitting: Family

## 2019-04-09 ENCOUNTER — Other Ambulatory Visit: Payer: Self-pay

## 2019-04-09 ENCOUNTER — Encounter: Payer: Self-pay | Admitting: Family

## 2019-04-09 VITALS — Ht 66.0 in | Wt 304.0 lb

## 2019-04-09 DIAGNOSIS — Z945 Skin transplant status: Secondary | ICD-10-CM

## 2019-04-09 DIAGNOSIS — I87331 Chronic venous hypertension (idiopathic) with ulcer and inflammation of right lower extremity: Secondary | ICD-10-CM

## 2019-04-09 NOTE — Progress Notes (Signed)
Office Visit Note   Patient: Alice [name]           Date of Birth: 1971/04/01           MRN: [id number] Visit Date: 04/09/2019              Requested by: Benito Mccreedy, MD [place] [id number] [place],  [place] [id number] PCP: Benito Mccreedy, MD  Chief Complaint  Patient presents with  . Right Leg - Follow-up    10/03/18 RLE STSG       HPI: Patient is a 48 year old woman with chronic venous insufficiency ulcer right leg. Is status post skin graft.  Has been showing marked improvement in ulcer. Doing dressing changes twice weekly at home.   Assessment & Plan: Visit Diagnoses:  1. Hx of skin graft   2. 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 (HCC)     Plan: Patient's wound bed continues to improve. we will continue with the 3 layer compression wrap, Silver cell to wound as this is working quite well.  Follow-Up Instructions: Return in about 3 weeks (around 04/30/2019).   Ortho Exam  Patient is alert, oriented, no adenopathy, well-dressed, normal affect, normal respiratory effort. Examination of the right leg patient has excellent improvement in the wound bed there is further epithelization around the wound edges. There is granulation tissue and surrounding dried drainage. No active drainage. No surrounding erythema.  Imaging: No results found. No images are attached to the encounter.  Labs: Lab Results  Component Value Date   REPTSTATUS 10/06/2018 FINAL 10/05/2018   GRAMSTAIN  09/28/2018    FEW WBC PRESENT, PREDOMINANTLY PMN FEW GRAM POSITIVE COCCI IN PAIRS FEW GRAM POSITIVE RODS    CULT 20,000 COLONIES/mL YEAST (A) 10/05/2018   LABORGA PROTEUS MIRABILIS 09/28/2018   LABORGA STAPHYLOCOCCUS CAPRAE 09/28/2018   LABORGA SERRATIA MARCESCENS 09/28/2018     Lab Results  Component Value Date   ALBUMIN 2.5 (L) 10/08/2018   ALBUMIN 3.2 (L) 01/29/2018   ALBUMIN 2.2 (L) 01/03/2017    Lab Results   Component Value Date   MG 1.8 12/29/2016   MG 1.9 12/28/2016   MG 1.9 05/17/2013   No results found for: VD25OH  No results found for: PREALBUMIN CBC EXTENDED Latest Ref Rng & Units 10/13/2018 10/12/2018 10/12/2018  WBC 4.0 - 10.5 K/uL 8.4 - 7.8  RBC 3.87 - 5.11 MIL/uL 3.92 2.91(L) 2.70(L)  HGB 12.0 - 15.0 g/dL 9.9(L) 7.4(L) 6.6(LL)  HCT 36.0 - 46.0 % 30.3(L) 22.7(L) 20.6(L)  PLT 150 - 400 K/uL 389 - 333  NEUTROABS 1.7 - 7.7 K/uL - - -  LYMPHSABS 0.7 - 4.0 K/uL - - -     Body mass index is 49.07 kg/m.  Orders:  No orders of the defined types were placed in this encounter.  No orders of the defined types were placed in this encounter.    Procedures: No procedures performed  Clinical Data: No additional findings.  ROS:  All other systems negative, except as noted in the HPI. Review of Systems  Constitutional: Negative for chills and fever.  Skin: Positive for wound. Negative for color change.    Objective: Vital Signs: Ht 5\' 6"  (1.676 m)   Wt (!) 304 lb (137.9 kg)   BMI 49.07 kg/m   Specialty Comments:  No specialty comments available.  PMFS History: Patient Active Problem List   Diagnosis Date Noted  . Subtherapeutic international normalized ratio (INR)   .  Postoperative pain   . Hypoalbuminemia   . Acute on chronic kidney failure ([place])   . Acute blood loss anemia   . Benign essential HTN   . Debility 10/05/2018  . Chronic ulcer of right leg, with fat layer exposed ([place]) 09/28/2018  . Chronic ulcer of leg, right, with necrosis of muscle ([place])   . Iron deficiency anemia 01/29/2018  . Pulmonary embolus ([place]) 01/09/2018  . Encounter for therapeutic drug monitoring 01/09/2018  . Severe malnutrition ([place]) 08/24/2017  . Acute kidney injury superimposed on CKD ([place]) 12/28/2016  . Hx of skin graft 08/01/2016  . 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 ([place]) 07/25/2016  . Idiopathic chronic venous hypertension of right lower extremity with  ulcer and inflammation ([place]) 07/05/2016  . Trichomonal infection 11/24/2015  . Abdominal pain 05/02/2015  . Symptomatic cholelithiasis 04/16/2015  . Acute bilateral upper abdominal pain 04/16/2015  . Microcytic anemia 05/18/2013  . Hypotension, unspecified 05/17/2013  . CKD (chronic kidney disease), stage III 05/17/2013  . Hypokalemia 05/17/2013  . Anal fissure 02/06/2013  . Anal skin tag 02/06/2013  . ALLERGIC RHINITIS 03/05/2010  . LOW BACK PAIN SYNDROME 12/13/2007  . OBESITY, MORBID 12/02/2006  . HTN (hypertension) 12/02/2006  . History of pulmonary embolism 12/02/2006  . History of DVT (deep vein thrombosis) 12/02/2006  . 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 GASTROESOPHAGEAL REFLUX DISEASE 12/02/2006  . OSA (obstructive sleep apnea) 12/02/2006   Past Medical History:  Diagnosis Date  . Anemia   . Anxiety   . Arthritis   . Chronic bronchitis ([place])   . Chronic upper back pain   . Depression   . DVT (deep venous thrombosis) (HCC)    BLE  . GERD (gastroesophageal reflux disease)   . Headache    "weekly" (04/16/2015)  . Hypertension   . Migraine    "monthly" (04/16/2015)  . Morbid obesity with BMI of 50.0-59.9, adult ([place])   . Obesity   . Pneumonia 2014  . Pulmonary embolism ([place])   . Sinusitis nasal   . Sleep apnea    "I'm suppose to wear a mask but I don't" (04/16/2015)  . Varicose veins of right lower extremity   . Venous stasis of lower extremity    right  . Wears dentures   . Wears glasses     Family History  Problem Relation Age of Onset  . Kidney disease Mother        kidney transplant    Past Surgical History:  Procedure Laterality Date  . CHOLECYSTECTOMY N/A 04/18/2015   Procedure: LAPAROSCOPIC CHOLECYSTECTOMY;  Surgeon: Ralene Ok, MD;  Location: [facility];  Service: General;  Laterality: N/A;  . HYSTEROSCOPY W/D&C  12/24/2001   Archie Endo 09/28/2010  . I&D EXTREMITY Right 07/25/2016   Procedure: IRRIGATION AND DEBRIDEMENT RIGHT LEG ULCER,  APPLY VERAFLO VAC;  Surgeon: [name], MD;  Location: [facility];  Service: Orthopedics;  Laterality: Right;  . I&D EXTREMITY Right 09/28/2018   Procedure: DEBRIDEMENT RIGHT LOWER LEG WITH PLACEMENT WITH PLACEMENT OF SKIN GRAFT AND VAC;  Surgeon: [name], MD;  Location: [facility];  Service: Orthopedics;  Laterality: Right;  . I&D EXTREMITY Right 10/03/2018   Procedure: REPEAT IRRIGATION AND DEBRIDEMENT RIGHT LOWER LEG, VAC PLACEMENT;  Surgeon: [name], MD;  Location: [facility];  Service: Orthopedics;  Laterality: Right;  . INCISE AND DRAIN ABCESS Right 07/14/2016  . INCISION AND DRAINAGE Right 09/10/2008   leg:  skin and soft tissue  and muscle/notes 09/15/2010  . INCISION AND DRAINAGE Right 01/01/2008   Chronic venous stasis insufficiency ulcer,/notes 09/14/2010/  . INCISION AND DRAINAGE Right [id number]   calf w/application wound vac/notes 07/20/2010  . INCISION AND DRAINAGE Right 07/25/2016   IRRIGATION AND DEBRIDEMENT RIGHT LEG ULCER,  . LAPAROSCOPIC GASTRIC BYPASS  ~ 2007  . MULTIPLE TOOTH EXTRACTIONS    . SKIN GRAFT SPLIT THICKNESS LEG / FOOT Right 07/25/2016   LEG  . SKIN SPLIT GRAFT Right 07/27/2016   Procedure: SKIN GRAFT RIGHT LEG WITH THERASKIN APPLICATION;  Surgeon: [name], MD;  Location: [facility];  Service: Orthopedics;  Laterality: Right;  . TONSILLECTOMY     Social History   Occupational History  . Not on file  Tobacco Use  . Smoking status: Never Smoker  . Smokeless tobacco: Never Used  Substance and Sexual Activity  . Alcohol use: No  . Drug use: No  . Sexual activity: Yes    Partners: Male    Birth control/protection: I.U.D.

## 2019-04-10 ENCOUNTER — Telehealth: Payer: Self-pay | Admitting: Family

## 2019-04-10 DIAGNOSIS — I87331 Chronic venous hypertension (idiopathic) with ulcer and inflammation of right lower extremity: Secondary | ICD-10-CM

## 2019-04-10 DIAGNOSIS — Z945 Skin transplant status: Secondary | ICD-10-CM

## 2019-04-10 MED ORDER — OXYCODONE HCL 10 MG PO TABS: 10.0000 mg | ORAL_TABLET | Freq: Two times a day (BID) | ORAL | 0 refills | Status: DC | PRN

## 2019-04-10 NOTE — Telephone Encounter (Signed)
Patient called advised the Rx for Oxycodone is not showing received at the pharmacy yet.  The number to contact patient is [phone]

## 2019-04-10 NOTE — Telephone Encounter (Signed)
Did you refill this for the pt yesterday?

## 2019-04-15 ENCOUNTER — Telehealth: Payer: Self-pay | Admitting: Orthopedic Surgery

## 2019-04-15 DIAGNOSIS — I87331 Chronic venous hypertension (idiopathic) with ulcer and inflammation of right lower extremity: Secondary | ICD-10-CM

## 2019-04-15 DIAGNOSIS — Z945 Skin transplant status: Secondary | ICD-10-CM

## 2019-04-15 NOTE — Telephone Encounter (Signed)
Pt called in very upset said she really needs her pain medication Oxycodone, she hasnt had her pain medication since last Monday 11/23, pt states she's been trying to contact erin but hasnt heard anything. Please have that sent to [facility] on [address].    [phone]

## 2019-04-16 ENCOUNTER — Telehealth: Payer: Self-pay | Admitting: Family

## 2019-04-16 MED ORDER — OXYCODONE HCL 10 MG PO TABS: 10.0000 mg | ORAL_TABLET | Freq: Two times a day (BID) | ORAL | 0 refills | Status: DC | PRN

## 2019-04-16 NOTE — Telephone Encounter (Signed)
Resent

## 2019-04-16 NOTE — Telephone Encounter (Signed)
Patient called about refill of Oxycodone. Stated called before and no responded.  Please cal patient @ [phone]

## 2019-04-16 NOTE — Telephone Encounter (Signed)
Can you please resubmit this rx. The pt states that this was not received by pharmacy and is very upset.

## 2019-04-23 ENCOUNTER — Other Ambulatory Visit: Payer: Self-pay | Admitting: Family

## 2019-04-23 ENCOUNTER — Ambulatory Visit: Payer: Medicare Other | Admitting: Family

## 2019-04-23 DIAGNOSIS — Z945 Skin transplant status: Secondary | ICD-10-CM

## 2019-04-23 DIAGNOSIS — L97919 Non-pressure chronic ulcer of unspecified part of right lower leg with unspecified severity: Secondary | ICD-10-CM

## 2019-04-23 DIAGNOSIS — I87331 Chronic venous hypertension (idiopathic) with ulcer and inflammation of right lower extremity: Secondary | ICD-10-CM

## 2019-04-25 NOTE — Telephone Encounter (Signed)
Made in error

## 2019-04-30 ENCOUNTER — Ambulatory Visit: Payer: Medicare Other | Admitting: Family

## 2019-05-06 ENCOUNTER — Other Ambulatory Visit: Payer: Self-pay | Admitting: Cardiology

## 2019-05-06 NOTE — Telephone Encounter (Signed)
Pt is on Eliquis for long term VTE prevention - Eliquis 2.5mg  BID dosing as most recent VTE event was years ago.  Pt last saw Dr Meda Coffee 03/09/17, Overdue for follow-up. Age 48, weight 137.9, based on indication and specified criteria pt is on appropriate dosage.  Will refill with note to call office to schedule MD visit for future refills.

## 2019-05-08 ENCOUNTER — Encounter: Payer: Self-pay | Admitting: Physician Assistant

## 2019-05-08 ENCOUNTER — Ambulatory Visit (INDEPENDENT_AMBULATORY_CARE_PROVIDER_SITE_OTHER): Payer: Medicare Other | Admitting: Physician Assistant

## 2019-05-08 ENCOUNTER — Other Ambulatory Visit: Payer: Self-pay

## 2019-05-08 VITALS — Ht 66.0 in | Wt 304.0 lb

## 2019-05-08 DIAGNOSIS — Z945 Skin transplant status: Secondary | ICD-10-CM | POA: Diagnosis not present

## 2019-05-08 NOTE — Progress Notes (Signed)
Office Visit Note   Patient: Alice [name]           Date of Birth: 1970/09/05           MRN: [id number] Visit Date: 05/08/2019              Requested by: Benito Mccreedy, MD [facility] [id number],  [place] [address] PCP: Benito Mccreedy, MD  Chief Complaint  Patient presents with  . Right Leg - Follow-up      HPI: Patient presents today in follow-up for her lower extremity and for her Dynaflex wrap.  She has had significant improvement in excellent ingrowth of her skin on her lower extremity    Assessment & Plan: Visit Diagnoses: No diagnosis found.  Plan: She will continue to do wrapping on her own and will follow up with [place] in 3 weeks we discussed beginning to use compression socks at that time  Follow-Up Instructions: No follow-ups on file.   Ortho Exam  Patient is alert, oriented, no adenopathy, well-dressed, normal affect, normal respiratory effort. Focused examination demonstrates some serous drainage no foul odor she is having healthy skin ingrowth into the ulcer the ulcer is not deep  Imaging: No results found.   Labs: Lab Results  Component Value Date   REPTSTATUS 10/06/2018 FINAL 10/05/2018   GRAMSTAIN  09/28/2018    FEW WBC PRESENT, PREDOMINANTLY PMN FEW GRAM POSITIVE COCCI IN PAIRS FEW GRAM POSITIVE RODS    CULT 20,000 COLONIES/mL YEAST (A) 10/05/2018   LABORGA PROTEUS MIRABILIS 09/28/2018   LABORGA STAPHYLOCOCCUS CAPRAE 09/28/2018   LABORGA SERRATIA MARCESCENS 09/28/2018     Lab Results  Component Value Date   ALBUMIN 2.5 (L) 10/08/2018   ALBUMIN 3.2 (L) 01/29/2018   ALBUMIN 2.2 (L) 01/03/2017    Lab Results  Component Value Date   MG 1.8 12/29/2016   MG 1.9 12/28/2016   MG 1.9 05/17/2013   No results found for: VD25OH  No results found for: PREALBUMIN CBC EXTENDED Latest Ref Rng & Units 10/13/2018 10/12/2018 10/12/2018  WBC 4.0 - 10.5 K/uL 8.4 - 7.8  RBC 3.87 - 5.11 MIL/uL 3.92 2.91(L) 2.70(L)  HGB 12.0 -  15.0 g/dL 9.9(L) 7.4(L) 6.6(LL)  HCT 36.0 - 46.0 % 30.3(L) 22.7(L) 20.6(L)  PLT 150 - 400 K/uL 389 - 333  NEUTROABS 1.7 - 7.7 K/uL - - -  LYMPHSABS 0.7 - 4.0 K/uL - - -     Body mass index is 49.07 kg/m.  Orders:  No orders of the defined types were placed in this encounter.  No orders of the defined types were placed in this encounter.    Procedures: No procedures performed  Clinical Data: No additional findings.  ROS:  All other systems negative, except as noted in the HPI. Review of Systems  Objective: Vital Signs: Ht 5\' 6"  (1.676 m)   Wt (!) 304 lb (137.9 kg)   BMI 49.07 kg/m   Specialty Comments:  No specialty comments available.  PMFS History: Patient Active Problem List   Diagnosis Date Noted  . Subtherapeutic international normalized ratio (INR)   . Postoperative pain   . Hypoalbuminemia   . Acute on chronic kidney failure ([place])   . Acute blood loss anemia   . Benign essential HTN   . Debility 10/05/2018  . Chronic ulcer of right leg, with fat layer exposed ([place]) 09/28/2018  . Chronic ulcer of leg, right, with necrosis of muscle ([place])   . Iron deficiency anemia 01/29/2018  .  Pulmonary embolus ([place]) 01/09/2018  . Encounter for therapeutic drug monitoring 01/09/2018  . Severe malnutrition ([place]) 08/24/2017  . Acute kidney injury superimposed on CKD ([place]) 12/28/2016  . Hx of skin graft 08/01/2016  . 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 ([place]) 07/25/2016  . 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 ([place]) 07/05/2016  . Trichomonal infection 11/24/2015  . Abdominal pain 05/02/2015  . Symptomatic cholelithiasis 04/16/2015  . Acute bilateral upper abdominal pain 04/16/2015  . Microcytic anemia 05/18/2013  . Hypotension, unspecified 05/17/2013  . CKD (chronic kidney disease), stage III 05/17/2013  . Hypokalemia 05/17/2013  . Anal fissure 02/06/2013  . Anal skin tag 02/06/2013  .  ALLERGIC RHINITIS 03/05/2010  . LOW BACK PAIN SYNDROME 12/13/2007  . OBESITY, MORBID 12/02/2006  . HTN (hypertension) 12/02/2006  . History of pulmonary embolism 12/02/2006  . History of DVT (deep vein thrombosis) 12/02/2006  . 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 GASTROESOPHAGEAL REFLUX DISEASE 12/02/2006  . OSA (obstructive sleep apnea) 12/02/2006   Past Medical History:  Diagnosis Date  . Anemia   . Anxiety   . Arthritis   . Chronic bronchitis ([place] [facility])   . Chronic upper back pain   . Depression   . DVT (deep venous thrombosis) ([facility])    BLE  . GERD (gastroesophageal reflux disease)   . Headache    "weekly" (04/16/2015)  . Hypertension   . Migraine    "monthly" (04/16/2015)  . Morbid obesity with BMI of 50.0-59.9, adult ([place])   . Obesity   . Pneumonia 2014  . Pulmonary embolism ([facility])   . Sinusitis nasal   . Sleep apnea    "I'm suppose to wear a mask but I don't" (04/16/2015)  . Varicose veins of right lower extremity   . Venous stasis of lower extremity    right  . Wears dentures   . Wears glasses     Family History  Problem Relation Age of Onset  . Kidney disease Mother        kidney transplant    Past Surgical History:  Procedure Laterality Date  . CHOLECYSTECTOMY N/A 04/18/2015   Procedure: LAPAROSCOPIC CHOLECYSTECTOMY;  Surgeon: Ralene Ok, MD;  Location: [facility];  Service: General;  Laterality: N/A;  . HYSTEROSCOPY WITH D & C  12/24/2001   Archie Endo 09/28/2010  . I & D EXTREMITY Right 07/25/2016   Procedure: IRRIGATION AND DEBRIDEMENT RIGHT LEG ULCER, APPLY VERAFLO VAC;  Surgeon: [name], MD;  Location: [facility];  Service: Orthopedics;  Laterality: Right;  . I & D EXTREMITY Right 09/28/2018   Procedure: DEBRIDEMENT RIGHT LOWER LEG WITH PLACEMENT WITH PLACEMENT OF SKIN GRAFT AND VAC;  Surgeon: [name], MD;  Location: [facility];  Service: Orthopedics;  Laterality: Right;  . I & D EXTREMITY Right 10/03/2018   Procedure: REPEAT IRRIGATION AND  DEBRIDEMENT RIGHT LOWER LEG, VAC PLACEMENT;  Surgeon: [name], MD;  Location: [facility];  Service: Orthopedics;  Laterality: Right;  . INCISE AND DRAIN ABCESS Right 07/14/2016  . INCISION AND DRAINAGE Right 09/10/2008   leg:  skin and soft tissue and muscle/notes 09/15/2010  . INCISION AND DRAINAGE Right 01/01/2008   Chronic venous stasis insufficiency ulcer,/notes 09/14/2010/  . INCISION AND DRAINAGE Right [id number]   calf w/application wound vac/notes 07/20/2010  . INCISION AND DRAINAGE Right 07/25/2016   IRRIGATION AND DEBRIDEMENT RIGHT LEG ULCER,  . LAPAROSCOPIC GASTRIC BYPASS  ~ 2007  . MULTIPLE  TOOTH EXTRACTIONS    . SKIN GRAFT SPLIT THICKNESS LEG / FOOT Right 07/25/2016   LEG  . SKIN SPLIT GRAFT Right 07/27/2016   Procedure: SKIN GRAFT RIGHT LEG WITH THERASKIN APPLICATION;  Surgeon: [name], MD;  Location: [facility];  Service: Orthopedics;  Laterality: Right;  . TONSILLECTOMY     Social History   Occupational History  . Not on file  Tobacco Use  . Smoking status: Never Smoker  . Smokeless tobacco: Never Used  Substance and Sexual Activity  . Alcohol use: No  . Drug use: No  . Sexual activity: Yes    Partners: Male    Birth control/protection: I.U.D.

## 2019-05-12 ENCOUNTER — Other Ambulatory Visit: Payer: Self-pay

## 2019-05-12 ENCOUNTER — Encounter (HOSPITAL_COMMUNITY): Payer: Self-pay | Admitting: Emergency Medicine

## 2019-05-12 ENCOUNTER — Emergency Department (HOSPITAL_COMMUNITY): Payer: Medicare Other

## 2019-05-12 ENCOUNTER — Emergency Department (HOSPITAL_COMMUNITY)
Admission: EM | Admit: 2019-05-12 | Discharge: 2019-05-13 | Disposition: A | Discharge status: Home / Self Care | Payer: Medicare Other | Attending: Emergency Medicine | Admitting: Emergency Medicine

## 2019-05-12 DIAGNOSIS — R197 Diarrhea, unspecified: Secondary | ICD-10-CM | POA: Diagnosis not present

## 2019-05-12 DIAGNOSIS — R079 Chest pain, unspecified: Secondary | ICD-10-CM | POA: Diagnosis not present

## 2019-05-12 DIAGNOSIS — N183 Chronic kidney disease, stage 3 unspecified: Secondary | ICD-10-CM | POA: Insufficient documentation

## 2019-05-12 DIAGNOSIS — Z79899 Other long term (current) drug therapy: Secondary | ICD-10-CM | POA: Diagnosis not present

## 2019-05-12 DIAGNOSIS — Z7901 Long term (current) use of anticoagulants: Secondary | ICD-10-CM | POA: Diagnosis not present

## 2019-05-12 DIAGNOSIS — Z20828 Contact with and (suspected) exposure to other viral communicable diseases: Secondary | ICD-10-CM | POA: Insufficient documentation

## 2019-05-12 DIAGNOSIS — R109 Unspecified abdominal pain: Secondary | ICD-10-CM | POA: Diagnosis not present

## 2019-05-12 DIAGNOSIS — I129 Hypertensive chronic kidney disease with stage 1 through stage 4 chronic kidney disease, or unspecified chronic kidney disease: Secondary | ICD-10-CM | POA: Diagnosis not present

## 2019-05-12 DIAGNOSIS — R112 Nausea with vomiting, unspecified: Secondary | ICD-10-CM

## 2019-05-12 DIAGNOSIS — R1110 Vomiting, unspecified: Secondary | ICD-10-CM | POA: Diagnosis present

## 2019-05-12 DIAGNOSIS — R0602 Shortness of breath: Secondary | ICD-10-CM | POA: Insufficient documentation

## 2019-05-12 LAB — COMPREHENSIVE METABOLIC PANEL
ALT: 18 U/L (ref 0–44)
AST: 25 U/L (ref 15–41)
Albumin: 3.4 g/dL — ABNORMAL LOW (ref 3.5–5.0)
Alkaline Phosphatase: 120 U/L (ref 38–126)
Anion gap: 9 (ref 5–15)
BUN: 18 mg/dL (ref 6–20)
CO2: 24 mmol/L (ref 22–32)
Calcium: 9.5 mg/dL (ref 8.9–10.3)
Chloride: 109 mmol/L (ref 98–111)
Creatinine, Ser: 1.6 mg/dL — ABNORMAL HIGH (ref 0.44–1.00)
GFR calc Af Amer: 44 mL/min — ABNORMAL LOW (ref >60)
GFR calc non Af Amer: 38 mL/min — ABNORMAL LOW (ref >60)
Glucose, Bld: 98 mg/dL (ref 70–99)
Potassium: 4 mmol/L (ref 3.5–5.1)
Sodium: 142 mmol/L (ref 135–145)
Total Bilirubin: 0.4 mg/dL (ref 0.3–1.2)
Total Protein: 7.5 g/dL (ref 6.5–8.1)

## 2019-05-12 LAB — CBC
HCT: 43.1 % (ref 36.0–46.0)
Hemoglobin: 14.4 g/dL (ref 12.0–15.0)
MCH: 27.7 pg (ref 26.0–34.0)
MCHC: 33.4 g/dL (ref 30.0–36.0)
MCV: 82.9 fL (ref 80.0–100.0)
Platelets: 240 10*3/uL (ref 150–400)
RBC: 5.2 MIL/uL — ABNORMAL HIGH (ref 3.87–5.11)
RDW: 14.4 % (ref 11.5–15.5)
WBC: 5.3 10*3/uL (ref 4.0–10.5)
nRBC: 0 % (ref 0.0–0.2)

## 2019-05-12 LAB — LIPASE, BLOOD: Lipase: 33 U/L (ref 11–51)

## 2019-05-12 MED ORDER — ONDANSETRON 4 MG PO TBDP
4.0000 mg | ORAL_TABLET | Freq: Once | ORAL | Status: AC | PRN
  Administered 2019-05-12: 19:00:00 4 mg via ORAL
  Filled 2019-05-12: qty 1

## 2019-05-12 MED ORDER — MORPHINE SULFATE (PF) 4 MG/ML IV SOLN
4.0000 mg | Freq: Once | INTRAVENOUS | Status: AC
  Administered 2019-05-13: 01:00:00 4 mg via INTRAVENOUS
  Filled 2019-05-12: qty 1

## 2019-05-12 MED ORDER — SODIUM CHLORIDE 0.9% FLUSH
3.0000 mL | Freq: Once | INTRAVENOUS | Status: AC
  Administered 2019-05-13: 01:00:00 3 mL via INTRAVENOUS

## 2019-05-12 MED ORDER — IBUPROFEN 400 MG PO TABS
400.0000 mg | ORAL_TABLET | Freq: Once | ORAL | Status: AC | PRN
  Administered 2019-05-12: 400 mg via ORAL
  Filled 2019-05-12: qty 1

## 2019-05-12 NOTE — ED Triage Notes (Signed)
Pt here for abdominal pain located in right lower abdomen radiating up to the right flank since christmas. Pt also vomiting and reports shortness of breath. States the pain can radiate to her upper back and chest too. Denies fevers.

## 2019-05-12 NOTE — ED Provider Notes (Signed)
Alice [name]   CSN: [id number] Arrival date & time: 05/12/19  [id number]     History Chief Complaint  Patient presents with  . Emesis  . Shortness of Breath  . Abdominal Pain    Alice [name] is a 48 y.o. female presenting for evaluation of nausea, vomiting, abdominal pain.  Patient states symptoms began 2 days ago.  She had acute onset right-sided abdominal pain which radiates to her flank.  It has been constant since.  She has associated nausea, has been up x2 today.  Patient states she has been alternating between diarrhea and constipation.  She has had 2 loose bowel movements today, no blood in her stool.  She has not taken anything for pain except a gas pill which did not help.  Eating makes her pain worse, nothing makes it better.  No change with urination or bowel movements.  She reports hot and cold chills, but no documented fever.  Patient states she is having chest pain and back pain, but that this has been present for several months.  There is no change in this recently.  She continues to take her blood thinner as prescribed. Reports a history of a cholecystectomy, no other abdominal surgeries.  HPI     Past Medical History:  Diagnosis Date  . Anemia   . Anxiety   . Arthritis   . Chronic bronchitis ([facility])   . Chronic upper back pain   . Depression   . DVT (deep venous thrombosis) (H[facility])    BLE  . GERD (gastroesophageal reflux disease)   . Headache    "weekly" (04/16/2015)  . Hypertension   . Migraine    "monthly" (04/16/2015)  . Morbid obesity with BMI of 50.0-59.9, adult ([place])   . Obesity   . Pneumonia 2014  . Pulmonary embolism ([facility] [place])   . Sinusitis nasal   . Sleep apnea    "I'm suppose to wear a mask but I don't" (04/16/2015)  . Varicose veins of right lower extremity   . Venous stasis of lower extremity    right  . Wears dentures   . Wears glasses     Patient Active Problem List   Diagnosis Date Noted    . Subtherapeutic international normalized ratio (INR)   . Postoperative pain   . Hypoalbuminemia   . Acute on chronic kidney failure ([facility])   . Acute blood loss anemia   . Benign essential HTN   . Debility 10/05/2018  . Chronic ulcer of right leg, with fat layer exposed ([place]) 09/28/2018  . Chronic ulcer of leg, right, with necrosis of muscle ([place])   . Iron deficiency anemia 01/29/2018  . Pulmonary embolus ([place]) 01/09/2018  . Encounter for therapeutic drug monitoring 01/09/2018  . Severe malnutrition ([place]) 08/24/2017  . Acute kidney injury superimposed on CKD ([place]) 12/28/2016  . Hx of skin graft 08/01/2016  . 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 ([place]) 07/25/2016  . 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 ([place]) 07/05/2016  . Trichomonal infection 11/24/2015  . Abdominal pain 05/02/2015  . Symptomatic cholelithiasis 04/16/2015  . Acute bilateral upper abdominal pain 04/16/2015  . Microcytic anemia 05/18/2013  . Hypotension, unspecified 05/17/2013  . CKD (chronic kidney disease), stage III 05/17/2013  . Hypokalemia 05/17/2013  . Anal fissure 02/06/2013  . Anal skin tag 02/06/2013  . ALLERGIC RHINITIS 03/05/2010  . LOW BACK PAIN SYNDROME 12/13/2007  . OBESITY, MORBID  12/02/2006  . HTN (hypertension) 12/02/2006  . History of pulmonary embolism 12/02/2006  . History of DVT (deep vein thrombosis) 12/02/2006  . 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 GASTROESOPHAGEAL REFLUX DISEASE 12/02/2006  . OSA (obstructive sleep apnea) 12/02/2006    Past Surgical History:  Procedure Laterality Date  . CHOLECYSTECTOMY N/A 04/18/2015   Procedure: LAPAROSCOPIC CHOLECYSTECTOMY;  Surgeon: Ralene Ok, MD;  Location: [facility];  Service: General;  Laterality: N/A;  . HYSTEROSCOPY WITH D & C  12/24/2001   Archie Endo 09/28/2010  . I & D EXTREMITY Right 07/25/2016   Procedure: IRRIGATION AND DEBRIDEMENT RIGHT LEG ULCER,  APPLY VERAFLO VAC;  Surgeon: [name], MD;  Location: [facility];  Service: Orthopedics;  Laterality: Right;  . I & D EXTREMITY Right 09/28/2018   Procedure: DEBRIDEMENT RIGHT LOWER LEG WITH PLACEMENT WITH PLACEMENT OF SKIN GRAFT AND VAC;  Surgeon: [name], MD;  Location: [facility];  Service: Orthopedics;  Laterality: Right;  . I & D EXTREMITY Right 10/03/2018   Procedure: REPEAT IRRIGATION AND DEBRIDEMENT RIGHT LOWER LEG, VAC PLACEMENT;  Surgeon: [name], MD;  Location: [facility];  Service: Orthopedics;  Laterality: Right;  . INCISE AND DRAIN ABCESS Right 07/14/2016  . INCISION AND DRAINAGE Right 09/10/2008   leg:  skin and soft tissue and muscle/notes 09/15/2010  . INCISION AND DRAINAGE Right 01/01/2008   Chronic venous stasis insufficiency ulcer,/notes 09/14/2010/  . INCISION AND DRAINAGE Right [id number]   calf w/application wound vac/notes 07/20/2010  . INCISION AND DRAINAGE Right 07/25/2016   IRRIGATION AND DEBRIDEMENT RIGHT LEG ULCER,  . LAPAROSCOPIC GASTRIC BYPASS  ~ 2007  . MULTIPLE TOOTH EXTRACTIONS    . SKIN GRAFT SPLIT THICKNESS LEG / FOOT Right 07/25/2016   LEG  . SKIN SPLIT GRAFT Right 07/27/2016   Procedure: SKIN GRAFT RIGHT LEG WITH THERASKIN APPLICATION;  Surgeon: [name], MD;  Location: [facility];  Service: Orthopedics;  Laterality: Right;  . TONSILLECTOMY       OB History    Gravida  0   Para  0   Term  0   Preterm  0   AB  0   Living  0     SAB  0   TAB  0   Ectopic  0   Multiple  0   Live Births              Family History  Problem Relation Age of Onset  . Kidney disease Mother        kidney transplant    Social History   Tobacco Use  . Smoking status: Never Smoker  . Smokeless tobacco: Never Used  Substance Use Topics  . Alcohol use: No  . Drug use: No    Home Medications Prior to Admission medications   Medication Sig Start Date End Date Taking? Authorizing Provider  acetaminophen (TYLENOL) 325 MG tablet Take 1-2 tablets (325-650  mg total) by mouth every 4 (four) hours as needed for mild pain. 10/12/18   Love, Ivan Anchors, PA-C  albuterol (PROVENTIL HFA;VENTOLIN HFA) 108 (90 Base) MCG/ACT inhaler Inhale 2 puffs into the lungs every 6 (six) hours as needed for wheezing or shortness of breath.     [provider]  amLODipine (NORVASC) 10 MG tablet Take 1 tablet (10 mg total) by mouth daily. 05/20/13   [name], MD  apixaban (ELIQUIS) 2.5 MG TABS tablet Take 1 tablet (2.5 mg total) by mouth 2 (two) times  daily. Pt is overdue for follow-up, MUST MAKE APPT WITH MD FOR FUTURE REFILLS. 05/06/19   Dorothy Spark, MD  buPROPion (WELLBUTRIN XL) 300 MG 24 hr tablet Take 300 mg by mouth daily.    [provider]  docusate sodium (COLACE) 100 MG capsule Take 1 capsule (100 mg total) by mouth 2 (two) times daily. 10/12/18   Love, Ivan Anchors, PA-C  ferrous sulfate 325 (65 FE) MG tablet Take 1 tablet (325 mg total) by mouth daily with breakfast. 10/13/18   Love, Ivan Anchors, PA-C  hydrocerin (EUCERIN) CREA Apply 1 application topically 2 (two) times daily. 10/12/18   Love, Ivan Anchors, PA-C  hydrochlorothiazide (HYDRODIURIL) 25 MG tablet Take 25 mg by mouth daily.    [provider]  Multiple Vitamin (MULTIVITAMIN WITH MINERALS) TABS tablet Take 1 tablet by mouth daily.    [provider]  nitroGLYCERIN (NITROSTAT) 0.4 MG SL tablet Place 1 tablet (0.4 mg total) under the tongue every 5 (five) minutes x 3 doses as needed for chest pain. 01/03/17   Rai, Ripudeep Raliegh Ip, MD  Oxycodone HCl 10 MG TABS Take 1 tablet (10 mg total) by mouth 2 (two) times daily as needed. 04/16/19   Suzan Slick, NP  pantoprazole (PROTONIX) 40 MG tablet Take 1 tablet (40 mg total) by mouth daily. For acid reflux 01/03/17   Rai, Ripudeep K, MD  pentoxifylline (TRENTAL) 400 MG CR tablet TAKE 1 TABLET(400 MG) BY MOUTH THREE TIMES DAILY WITH MEALS 02/14/18   [name], MD  polyethylene glycol (MIRALAX / GLYCOLAX) 17 g packet Take 17 g by  mouth daily. 10/13/18   Love, Ivan Anchors, PA-C  pregabalin (LYRICA) 200 MG capsule Take 200 mg by mouth 2 (two) times a day. 08/24/18   [provider]  senna-docusate (SENOKOT-S) 8.6-50 MG tablet Take 2 tablets by mouth 2 (two) times daily. 10/12/18   Love, Ivan Anchors, PA-C  tiZANidine (ZANAFLEX) 4 MG tablet Take 4-8 mg by mouth every 8 (eight) hours as needed for muscle spasms (pain).    [provider]  traMADol (ULTRAM) 50 MG tablet Take 1 tablet (50 mg total) by mouth every 6 (six) hours as needed for moderate pain. 10/12/18   Bary Leriche, PA-C    Allergies    Vicodin [hydrocodone-acetaminophen]  Review of Systems   Review of Systems  Constitutional: Positive for chills.  Cardiovascular: Positive for chest pain.  Gastrointestinal: Positive for abdominal pain, constipation, diarrhea, nausea and vomiting.  All other systems reviewed and are negative.   Physical Exam Updated Vital Signs BP (!) 141/78 (BP Location: Right Arm)   Pulse (!) 42   Temp 98.7 F (37.1 C) (Oral)   Resp (!) 28   SpO2 99%   Physical Exam Vitals and nursing [name] reviewed.  Constitutional:      General: She is not in acute distress.    Appearance: She is well-developed.     Comments: Obese female who appears very anxious, but nontoxic in appearance  HENT:     Head: Normocephalic and atraumatic.  Eyes:     Extraocular Movements: Extraocular movements intact.     Conjunctiva/sclera: Conjunctivae normal.     Pupils: Pupils are equal, round, and reactive to light.  Cardiovascular:     Rate and Rhythm: Normal rate and regular rhythm.     Pulses: Normal pulses.  Pulmonary:     Effort: Pulmonary effort is normal. No respiratory distress.     Breath sounds: Normal breath  sounds. No wheezing.     Comments: Speaking in full sentences.  Clear lung sounds no fields.  Hyperventilating due to pain. Abdominal:     General: There is no distension.     Palpations: Abdomen is soft. There is no mass.       Tenderness: There is abdominal tenderness. There is no guarding or rebound.     Comments: generalized abd tenderness. No cva tenderness. No rigidity or distention. Negative rebound  Musculoskeletal:        General: Normal range of motion.     Cervical back: Normal range of motion and neck supple.  Skin:    General: Skin is warm and dry.     Capillary Refill: Capillary refill takes less than 2 seconds.  Neurological:     Mental Status: She is alert and oriented to person, place, and time.     ED Results / Procedures / Treatments   Labs (all labs ordered are listed, but only abnormal results are displayed) Labs Reviewed  COMPREHENSIVE METABOLIC PANEL - Abnormal; Notable for the following components:      Result Value   Creatinine, Ser 1.60 (*)    Albumin 3.4 (*)    GFR calc non Af Amer 38 (*)    GFR calc Af Amer 44 (*)    All other components within normal limits  CBC - Abnormal; Notable for the following components:   RBC 5.20 (*)    All other components within normal limits  LIPASE, BLOOD  URINALYSIS, ROUTINE W REFLEX MICROSCOPIC  I-STAT BETA HCG BLOOD, ED ([facility], [facility], [facility] ONLY)    EKG EKG Interpretation  Date/Time:  Sunday May 12 2019 18:39:05 EST Ventricular Rate:  98 PR Interval:  124 QRS Duration: 92 QT Interval:  364 QTC Calculation: 464 R Axis:   118 Text Interpretation: Sinus rhythm with Premature supraventricular complexes Left posterior fascicular block Cannot rule out Anterior infarct , age undetermined Abnormal ECG similar to previous Confirmed by Theotis Burrow [phone]) on 05/12/2019 9:42:52 PM   Radiology DG Chest 2 View  Result Date: 05/12/2019 CLINICAL DATA:  Chest pain, back pain EXAM: CHEST - 2 VIEW COMPARISON:  Radiograph December 28, 2016, CT chest 02/02/2010 FINDINGS: Low volumes with streaky atelectatic change. No consolidation, features of edema, pneumothorax, or effusion. The cardiomediastinal contours are unremarkable. No acute osseous or  soft tissue abnormality. Degenerative changes are present in the imaged spine and shoulders. IMPRESSION: Low volumes and atelectasis. Otherwise no acute cardiopulmonary abnormality. Electronically Signed   By: Lovena Le M.D.   On: 05/12/2019 22:34    Procedures Procedures (including critical care time)  Medications Ordered in ED Medications  sodium chloride flush (NS) 0.9 % injection 3 mL (has no administration in time range)  morphine 4 MG/ML injection 4 mg (has no administration in time range)  ondansetron (ZOFRAN-ODT) disintegrating tablet 4 mg (4 mg Oral Given 05/12/19 1849)  ibuprofen (ADVIL) tablet 400 mg (400 mg Oral Given 05/12/19 1849)    ED Course  I have reviewed the triage vital signs and the nursing notes.  Pertinent labs & imaging results that were available during my care of the patient were reviewed by me and considered in my medical decision making (see chart for details).    MDM Rules/Calculators/A&P                      Patient presenting for evaluation abdominal pain, nausea, vomiting.  Physical exam shows patient who appears anxious and is  hyperventilating due to pain, but nontoxic in appearance.  Abdominal exam shows generalized tenderness, but no rigidity or signs of peritonitis.  Labs obtained from triage are reassuring, normocytosis, joint stable.  However, will obtain CT scan for further evaluation.  Consider kidney stone versus appendicitis versus abnormal presentation of bowel obstruction versus enteritis.  Will obtain x-ray due to reports of chest pain, however she is on blood thinners and doubt PE.  Exam is not consistent with ACS.  And pain has been present for months, doubt acute intervention.   Pt signed out to Andris Baumann, PA-C for f/u on CT. If negative, pt can likely be d/c with symptomatic tx.   Final Clinical Impression(s) / ED Diagnoses Final diagnoses:  None    Rx / DC Orders ED Discharge Orders    None       Franchot Heidelberg,  PA-C 05/12/19 [id number]    Little, Wenda Overland, MD 05/14/19 1256

## 2019-05-12 NOTE — ED Notes (Signed)
Pt satting 100% on RA, states she feels SOB.

## 2019-05-13 ENCOUNTER — Emergency Department (HOSPITAL_COMMUNITY): Payer: Medicare Other

## 2019-05-13 DIAGNOSIS — R112 Nausea with vomiting, unspecified: Secondary | ICD-10-CM | POA: Diagnosis not present

## 2019-05-13 LAB — SARS CORONAVIRUS 2 (TAT 6-24 HRS): SARS Coronavirus 2: NEGATIVE

## 2019-05-13 LAB — URINALYSIS, ROUTINE W REFLEX MICROSCOPIC
Bacteria, UA: NONE SEEN
Bilirubin Urine: NEGATIVE
Glucose, UA: NEGATIVE mg/dL
Hgb urine dipstick: NEGATIVE
Ketones, ur: NEGATIVE mg/dL
Leukocytes,Ua: NEGATIVE
Nitrite: NEGATIVE
Protein, ur: 100 mg/dL — AB
Specific Gravity, Urine: 1.038 — ABNORMAL HIGH (ref 1.005–1.030)
pH: 6 (ref 5.0–8.0)

## 2019-05-13 LAB — I-STAT BETA HCG BLOOD, ED (MC, WL, AP ONLY): I-stat hCG, quantitative: <5 m[IU]/mL (ref <5)

## 2019-05-13 MED ORDER — ONDANSETRON 4 MG PO TBDP: 4.0000 mg | ORAL_TABLET | Freq: Three times a day (TID) | ORAL | 0 refills | Status: DC | PRN

## 2019-05-13 MED ORDER — ONDANSETRON HCL 4 MG/2ML IJ SOLN
4.0000 mg | Freq: Once | INTRAMUSCULAR | Status: AC
  Administered 2019-05-13: 4 mg via INTRAVENOUS
  Filled 2019-05-13: qty 2

## 2019-05-13 MED ORDER — FENTANYL CITRATE (PF) 100 MCG/2ML IJ SOLN
50.0000 ug | Freq: Once | INTRAMUSCULAR | Status: AC
  Administered 2019-05-13: 50 ug via INTRAVENOUS
  Filled 2019-05-13: qty 2

## 2019-05-13 MED ORDER — IOHEXOL 300 MG/ML  SOLN
100.0000 mL | Freq: Once | INTRAMUSCULAR | Status: AC | PRN
  Administered 2019-05-13: 100 mL via INTRAVENOUS

## 2019-05-13 MED ORDER — SODIUM CHLORIDE 0.9 % IV BOLUS
1000.0000 mL | Freq: Once | INTRAVENOUS | Status: AC
  Administered 2019-05-13: 1000 mL via INTRAVENOUS

## 2019-05-13 MED ORDER — SODIUM CHLORIDE 0.9 % IV BOLUS: 1000.0000 mL | Freq: Once | INTRAVENOUS | Status: DC

## 2019-05-13 NOTE — ED Notes (Signed)
540mL remaining on pt's first bolus. Will administer via pump 949mL/hr

## 2019-05-13 NOTE — ED Notes (Signed)
IV team at bedside 

## 2019-05-13 NOTE — ED Notes (Signed)
Patient transported to CT 

## 2019-05-13 NOTE — ED Provider Notes (Signed)
Patient with nausea, vomiting, diarrhea.   Continues to feel nauseated.  CT negative.  Labs are reassuring.  No clear cause for the patient's symptoms.  DC to home with return precautions.   Alithea, [place], PA-C 05/13/19 [id number]    Veryl Speak, MD 05/13/19 [phone]

## 2019-05-13 NOTE — Discharge Instructions (Addendum)
Return to the ER if your symptoms worsen.  No clear cause for your symptoms was found tonight.  Your COVID test is still pending.

## 2019-05-22 ENCOUNTER — Ambulatory Visit (INDEPENDENT_AMBULATORY_CARE_PROVIDER_SITE_OTHER): Payer: Medicare Other | Admitting: Family

## 2019-05-22 ENCOUNTER — Other Ambulatory Visit: Payer: Self-pay

## 2019-05-22 ENCOUNTER — Encounter: Payer: Self-pay | Admitting: Family

## 2019-05-22 DIAGNOSIS — I87331 Chronic venous hypertension (idiopathic) with ulcer and inflammation of right lower extremity: Secondary | ICD-10-CM

## 2019-05-22 DIAGNOSIS — Z945 Skin transplant status: Secondary | ICD-10-CM | POA: Diagnosis not present

## 2019-05-22 DIAGNOSIS — L97919 Non-pressure chronic ulcer of unspecified part of right lower leg with unspecified severity: Secondary | ICD-10-CM

## 2019-05-22 MED ORDER — OXYCODONE HCL 5 MG PO TABS: 5.0000 mg | ORAL_TABLET | Freq: Three times a day (TID) | ORAL | 0 refills | Status: DC | PRN

## 2019-05-22 NOTE — Progress Notes (Signed)
Office Visit Note   Patient: Alice [name]           Date of Birth: 25-Aug-1970           MRN: [id number] Visit Date: 05/22/2019              Requested by: Benito Mccreedy, MD [id number] [place],  [place] [address] PCP: Benito Mccreedy, MD  No chief complaint on file.     HPI: Patient is a 49 year old woman with chronic venous insufficiency ulcer right leg. Is status post skin graft.  Has been showing marked improvement in ulcer. Unfortunately her healing has stalled. Doing dressing changes twice weekly at home. shes been using silvercell. Is considering transistioning to vive compression stocking, feels she has too much drainage to do so.  Assessment & Plan: Visit Diagnoses:  No diagnosis found.  Plan: will transition to vive compression stockings. abd pad over sock for drainage.   Follow-Up Instructions: No follow-ups on file.   Ortho Exam  Patient is alert, oriented, no adenopathy, well-dressed, normal affect, normal respiratory effort. Examination of the right leg patient has excellent granulation in the wound bed there is further epithelization around the wound edges. Wound is now 6 cm x 2.5. There is granulation tissue and surrounding dried drainage. No active drainage. No surrounding erythema.  See attached images.  Imaging: No results found. No images are attached to the encounter.  Labs: Lab Results  Component Value Date   REPTSTATUS 10/06/2018 FINAL 10/05/2018   GRAMSTAIN  09/28/2018    FEW WBC PRESENT, PREDOMINANTLY PMN FEW GRAM POSITIVE COCCI IN PAIRS FEW GRAM POSITIVE RODS    CULT 20,000 COLONIES/mL YEAST (A) 10/05/2018   LABORGA PROTEUS MIRABILIS 09/28/2018   LABORGA STAPHYLOCOCCUS CAPRAE 09/28/2018   LABORGA SERRATIA MARCESCENS 09/28/2018     Lab Results  Component Value Date   ALBUMIN 3.4 (L) 05/12/2019   ALBUMIN 2.5 (L) 10/08/2018   ALBUMIN 3.2 (L) 01/29/2018    Lab Results  Component Value Date   MG 1.8  12/29/2016   MG 1.9 12/28/2016   MG 1.9 05/17/2013   No results found for: VD25OH  No results found for: PREALBUMIN CBC EXTENDED Latest Ref Rng & Units 05/12/2019 10/13/2018 10/12/2018  WBC 4.0 - 10.5 K/uL 5.3 8.4 -  RBC 3.87 - 5.11 MIL/uL 5.20(H) 3.92 2.91(L)  HGB 12.0 - 15.0 g/dL 14.4 9.9(L) 7.4(L)  HCT 36.0 - 46.0 % 43.1 30.3(L) 22.7(L)  PLT 150 - 400 K/uL 240 389 -  NEUTROABS 1.7 - 7.7 K/uL - - -  LYMPHSABS 0.7 - 4.0 K/uL - - -     There is no height or weight on file to calculate BMI.  Orders:  No orders of the defined types were placed in this encounter.  No orders of the defined types were placed in this encounter.    Procedures: No procedures performed  Clinical Data: No additional findings.  ROS:  All other systems negative, except as noted in the HPI. Review of Systems  Constitutional: Negative for chills and fever.  Skin: Positive for wound. Negative for color change.    Objective: Vital Signs: There were no vitals taken for this visit.  Specialty Comments:  No specialty comments available.  PMFS History: Patient Active Problem List   Diagnosis Date Noted  . Subtherapeutic international normalized ratio (INR)   . Postoperative pain   . Hypoalbuminemia   . Acute on chronic kidney failure ([place])   . Acute blood loss  anemia   . Benign essential HTN   . Debility 10/05/2018  . Chronic ulcer of right leg, with fat layer exposed ([place]) 09/28/2018  . Chronic ulcer of leg, right, with necrosis of muscle ([place])   . Iron deficiency anemia 01/29/2018  . Pulmonary embolus ([place]) 01/09/2018  . Encounter for therapeutic drug monitoring 01/09/2018  . Severe malnutrition ([place]) 08/24/2017  . Acute kidney injury superimposed on CKD ([place]) 12/28/2016  . Hx of skin graft 08/01/2016  . 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 ([place]) 07/25/2016  . 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 ([place]) 07/05/2016    . Trichomonal infection 11/24/2015  . Abdominal pain 05/02/2015  . Symptomatic cholelithiasis 04/16/2015  . Acute bilateral upper abdominal pain 04/16/2015  . Microcytic anemia 05/18/2013  . Hypotension, unspecified 05/17/2013  . CKD (chronic kidney disease), stage III 05/17/2013  . Hypokalemia 05/17/2013  . Anal fissure 02/06/2013  . Anal skin tag 02/06/2013  . ALLERGIC RHINITIS 03/05/2010  . LOW BACK PAIN SYNDROME 12/13/2007  . OBESITY, MORBID 12/02/2006  . HTN (hypertension) 12/02/2006  . History of pulmonary embolism 12/02/2006  . History of DVT (deep vein thrombosis) 12/02/2006  . 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 GASTROESOPHAGEAL REFLUX DISEASE 12/02/2006  . OSA (obstructive sleep apnea) 12/02/2006   Past Medical History:  Diagnosis Date  . Anemia   . Anxiety   . Arthritis   . Chronic bronchitis ([place])   . Chronic upper back pain   . Depression   . DVT (deep venous thrombosis) (H[facility])    BLE  . GERD (gastroesophageal reflux disease)   . Headache    "weekly" (04/16/2015)  . Hypertension   . Migraine    "monthly" (04/16/2015)  . Morbid obesity with BMI of 50.0-59.9, adult ([place])   . Obesity   . Pneumonia 2014  . Pulmonary embolism ([place])   . Sinusitis nasal   . Sleep apnea    "I'm suppose to wear a mask but I don't" (04/16/2015)  . Varicose veins of right lower extremity   . Venous stasis of lower extremity    right  . Wears dentures   . Wears glasses     Family History  Problem Relation Age of Onset  . Kidney disease Mother        kidney transplant    Past Surgical History:  Procedure Laterality Date  . CHOLECYSTECTOMY N/A 04/18/2015   Procedure: LAPAROSCOPIC CHOLECYSTECTOMY;  Surgeon: Ralene Ok, MD;  Location: [facility];  Service: General;  Laterality: N/A;  . HYSTEROSCOPY WITH D & C  12/24/2001   Archie Endo 09/28/2010  . I & D EXTREMITY Right 07/25/2016   Procedure: IRRIGATION AND DEBRIDEMENT RIGHT LEG ULCER, APPLY VERAFLO VAC;  Surgeon:  [name], MD;  Location: [facility];  Service: Orthopedics;  Laterality: Right;  . I & D EXTREMITY Right 09/28/2018   Procedure: DEBRIDEMENT RIGHT LOWER LEG WITH PLACEMENT WITH PLACEMENT OF SKIN GRAFT AND VAC;  Surgeon: [name], MD;  Location: [facility];  Service: Orthopedics;  Laterality: Right;  . I & D EXTREMITY Right 10/03/2018   Procedure: REPEAT IRRIGATION AND DEBRIDEMENT RIGHT LOWER LEG, VAC PLACEMENT;  Surgeon: [name], MD;  Location: [facility];  Service: Orthopedics;  Laterality: Right;  . INCISE AND DRAIN ABCESS Right 07/14/2016  . INCISION AND DRAINAGE Right 09/10/2008   leg:  skin and soft tissue and muscle/notes 09/15/2010  . INCISION AND DRAINAGE Right 01/01/2008  Chronic venous stasis insufficiency ulcer,/notes 09/14/2010/  . INCISION AND DRAINAGE Right [id number]   calf w/application wound vac/notes 07/20/2010  . INCISION AND DRAINAGE Right 07/25/2016   IRRIGATION AND DEBRIDEMENT RIGHT LEG ULCER,  . LAPAROSCOPIC GASTRIC BYPASS  ~ 2007  . MULTIPLE TOOTH EXTRACTIONS    . SKIN GRAFT SPLIT THICKNESS LEG / FOOT Right 07/25/2016   LEG  . SKIN SPLIT GRAFT Right 07/27/2016   Procedure: SKIN GRAFT RIGHT LEG WITH THERASKIN APPLICATION;  Surgeon: [name], MD;  Location: [facility];  Service: Orthopedics;  Laterality: Right;  . TONSILLECTOMY     Social History   Occupational History  . Not on file  Tobacco Use  . Smoking status: Never Smoker  . Smokeless tobacco: Never Used  Substance and Sexual Activity  . Alcohol use: No  . Drug use: No  . Sexual activity: Yes    Partners: Male    Birth control/protection: I.U.D.

## 2019-06-17 DIAGNOSIS — R600 Localized edema: Secondary | ICD-10-CM | POA: Diagnosis not present

## 2019-06-17 DIAGNOSIS — E559 Vitamin D deficiency, unspecified: Secondary | ICD-10-CM | POA: Diagnosis not present

## 2019-06-17 DIAGNOSIS — F329 Major depressive disorder, single episode, unspecified: Secondary | ICD-10-CM | POA: Diagnosis not present

## 2019-06-17 DIAGNOSIS — G4733 Obstructive sleep apnea (adult) (pediatric): Secondary | ICD-10-CM | POA: Diagnosis not present

## 2019-06-17 DIAGNOSIS — R1013 Epigastric pain: Secondary | ICD-10-CM | POA: Diagnosis not present

## 2019-06-17 DIAGNOSIS — R7303 Prediabetes: Secondary | ICD-10-CM | POA: Diagnosis not present

## 2019-06-17 DIAGNOSIS — D508 Other iron deficiency anemias: Secondary | ICD-10-CM | POA: Diagnosis not present

## 2019-06-17 DIAGNOSIS — I10 Essential (primary) hypertension: Secondary | ICD-10-CM | POA: Diagnosis not present

## 2019-06-17 DIAGNOSIS — G629 Polyneuropathy, unspecified: Secondary | ICD-10-CM | POA: Diagnosis not present

## 2019-06-18 ENCOUNTER — Encounter: Payer: Self-pay | Admitting: Family

## 2019-06-18 ENCOUNTER — Other Ambulatory Visit: Payer: Self-pay

## 2019-06-18 ENCOUNTER — Ambulatory Visit (INDEPENDENT_AMBULATORY_CARE_PROVIDER_SITE_OTHER): Payer: Medicare Other | Admitting: Family

## 2019-06-18 VITALS — Ht 66.0 in | Wt 304.0 lb

## 2019-06-18 DIAGNOSIS — G5601 Carpal tunnel syndrome, right upper limb: Secondary | ICD-10-CM | POA: Diagnosis not present

## 2019-06-18 DIAGNOSIS — L97912 Non-pressure chronic ulcer of unspecified part of right lower leg with fat layer exposed: Secondary | ICD-10-CM

## 2019-06-18 DIAGNOSIS — Z945 Skin transplant status: Secondary | ICD-10-CM

## 2019-06-18 MED ORDER — OXYCODONE HCL 10 MG PO TABS: 10.0000 mg | ORAL_TABLET | Freq: Every morning | ORAL | 0 refills | Status: AC

## 2019-06-18 NOTE — Addendum Note (Signed)
Addended by: Suzan Slick on: 06/18/2019 03:57 PM   Modules accepted: Orders

## 2019-06-18 NOTE — Progress Notes (Signed)
Office Visit Note   Patient: Alice [name]           Date of Birth: 19-Feb-1971           MRN: [id number] Visit Date: 06/18/2019              Requested by: Benito Mccreedy, MD [id number] [place],  [place] [address] PCP: Benito Mccreedy, MD  Chief Complaint  Patient presents with  . Right Leg - Follow-up    09/2018 repeat I&D RLE [facility]      HPI: Patient is a 49 year old woman with chronic venous insufficiency ulcer right leg. Is status post skin graft.  Has been showing marked improvement in ulcer. Unfortunately her healing has stalled. Doing dressing changes twice weekly at home. shes been using silvercell. Is considering transistioning to vive compression stocking, feels she has too much drainage to do so.  Assessment & Plan: Visit Diagnoses:  1. Hx of skin graft   2. Chronic ulcer of right leg, with fat layer exposed ([place])     Plan: will continue vive compression stockings. Wrist splint at night.  Follow-Up Instructions: Return in about 4 weeks (around 07/16/2019).   Right Hand Exam   Tenderness  The patient is experiencing tenderness in the palmar area.  Range of Motion  The patient has normal right wrist ROM.   Muscle Strength  The patient has normal right wrist strength.  Tests  Phalen's Sign: negative Tinel's sign (median nerve): positive  Other  Erythema: absent Pulse: present  Comments:  Mild edema to wrist       Patient is alert, oriented, no adenopathy, well-dressed, normal affect, normal respiratory effort. Examination of the right leg patient has excellent granulation in the wound bed there is further epithelization around the wound edges. There is granulation tissue and surrounding dried drainage. No active drainage. No surrounding erythema.  See attached images.  Imaging: No results found. No images are attached to the encounter.  Labs: Lab Results  Component Value Date   REPTSTATUS 10/06/2018 FINAL 10/05/2018     GRAMSTAIN  09/28/2018    FEW WBC PRESENT, PREDOMINANTLY PMN FEW GRAM POSITIVE COCCI IN PAIRS FEW GRAM POSITIVE RODS    CULT 20,000 COLONIES/mL YEAST (A) 10/05/2018   LABORGA PROTEUS MIRABILIS 09/28/2018   LABORGA STAPHYLOCOCCUS CAPRAE 09/28/2018   LABORGA SERRATIA MARCESCENS 09/28/2018     Lab Results  Component Value Date   ALBUMIN 3.4 (L) 05/12/2019   ALBUMIN 2.5 (L) 10/08/2018   ALBUMIN 3.2 (L) 01/29/2018    Lab Results  Component Value Date   MG 1.8 12/29/2016   MG 1.9 12/28/2016   MG 1.9 05/17/2013   No results found for: VD25OH  No results found for: PREALBUMIN CBC EXTENDED Latest Ref Rng & Units 05/12/2019 10/13/2018 10/12/2018  WBC 4.0 - 10.5 K/uL 5.3 8.4 -  RBC 3.87 - 5.11 MIL/uL 5.20(H) 3.92 2.91(L)  HGB 12.0 - 15.0 g/dL 14.4 9.9(L) 7.4(L)  HCT 36.0 - 46.0 % 43.1 30.3(L) 22.7(L)  PLT 150 - 400 K/uL 240 389 -  NEUTROABS 1.7 - 7.7 K/uL - - -  LYMPHSABS 0.7 - 4.0 K/uL - - -     Body mass index is 49.07 kg/m.  Orders:  No orders of the defined types were placed in this encounter.  No orders of the defined types were placed in this encounter.    Procedures: No procedures performed  Clinical Data: No additional findings.  ROS:  All other systems negative,  except as noted in the HPI. Review of Systems  Constitutional: Negative for chills and fever.  Skin: Positive for wound. Negative for color change.    Objective: Vital Signs: Ht 5\' 6"  (1.676 m)   Wt (!) 304 lb (137.9 kg)   BMI 49.07 kg/m   Specialty Comments:  No specialty comments available.  PMFS History: Patient Active Problem List   Diagnosis Date Noted  . Subtherapeutic international normalized ratio (INR)   . Postoperative pain   . Hypoalbuminemia   . Acute on chronic kidney failure ([place])   . Acute blood loss anemia   . Benign essential HTN   . Debility 10/05/2018  . Chronic ulcer of right leg, with fat layer exposed ([place]) 09/28/2018  . Chronic ulcer of leg, right, with  necrosis of muscle ([place])   . Iron deficiency anemia 01/29/2018  . Pulmonary embolus ([place]) 01/09/2018  . Encounter for therapeutic drug monitoring 01/09/2018  . Severe malnutrition ([place]) 08/24/2017  . Acute kidney injury superimposed on CKD ([place]) 12/28/2016  . Hx of skin graft 08/01/2016  . 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 ([place]) 07/25/2016  . 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 ([place]) 07/05/2016  . Trichomonal infection 11/24/2015  . Abdominal pain 05/02/2015  . Symptomatic cholelithiasis 04/16/2015  . Acute bilateral upper abdominal pain 04/16/2015  . Microcytic anemia 05/18/2013  . Hypotension, unspecified 05/17/2013  . CKD (chronic kidney disease), stage III 05/17/2013  . Hypokalemia 05/17/2013  . Anal fissure 02/06/2013  . Anal skin tag 02/06/2013  . ALLERGIC RHINITIS 03/05/2010  . LOW BACK PAIN SYNDROME 12/13/2007  . OBESITY, MORBID 12/02/2006  . HTN (hypertension) 12/02/2006  . History of pulmonary embolism 12/02/2006  . History of DVT (deep vein thrombosis) 12/02/2006  . 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 GASTROESOPHAGEAL REFLUX DISEASE 12/02/2006  . OSA (obstructive sleep apnea) 12/02/2006   Past Medical History:  Diagnosis Date  . Anemia   . Anxiety   . Arthritis   . Chronic bronchitis ([place])   . Chronic upper back pain   . Depression   . DVT (deep venous thrombosis) ([facility])    BLE  . GERD (gastroesophageal reflux disease)   . Headache    "weekly" (04/16/2015)  . Hypertension   . Migraine    "monthly" (04/16/2015)  . Morbid obesity with BMI of 50.0-59.9, adult ([place])   . Obesity   . Pneumonia 2014  . Pulmonary embolism ([name])   . Sinusitis nasal   . Sleep apnea    "I'm suppose to wear a mask but I don't" (04/16/2015)  . Varicose veins of right lower extremity   . Venous stasis of lower extremity    right  . Wears dentures   . Wears glasses     Family History    Problem Relation Age of Onset  . Kidney disease Mother        kidney transplant    Past Surgical History:  Procedure Laterality Date  . CHOLECYSTECTOMY N/A 04/18/2015   Procedure: LAPAROSCOPIC CHOLECYSTECTOMY;  Surgeon: Ralene Ok, MD;  Location: [facility];  Service: General;  Laterality: N/A;  . HYSTEROSCOPY WITH D & C  12/24/2001   Archie Endo 09/28/2010  . I & D EXTREMITY Right 07/25/2016   Procedure: IRRIGATION AND DEBRIDEMENT RIGHT LEG ULCER, APPLY VERAFLO VAC;  Surgeon: [name], MD;  Location: [facility];  Service: Orthopedics;  Laterality: Right;  . I & D EXTREMITY Right 09/28/2018  Procedure: DEBRIDEMENT RIGHT LOWER LEG WITH PLACEMENT WITH PLACEMENT OF SKIN GRAFT AND VAC;  Surgeon: [name], MD;  Location: [facility];  Service: Orthopedics;  Laterality: Right;  . I & D EXTREMITY Right 10/03/2018   Procedure: REPEAT IRRIGATION AND DEBRIDEMENT RIGHT LOWER LEG, VAC PLACEMENT;  Surgeon: [name], MD;  Location: [facility];  Service: Orthopedics;  Laterality: Right;  . INCISE AND DRAIN ABCESS Right 07/14/2016  . INCISION AND DRAINAGE Right 09/10/2008   leg:  skin and soft tissue and muscle/notes 09/15/2010  . INCISION AND DRAINAGE Right 01/01/2008   Chronic venous stasis insufficiency ulcer,/notes 09/14/2010/  . INCISION AND DRAINAGE Right [id number]   calf w/application wound vac/notes 07/20/2010  . INCISION AND DRAINAGE Right 07/25/2016   IRRIGATION AND DEBRIDEMENT RIGHT LEG ULCER,  . LAPAROSCOPIC GASTRIC BYPASS  ~ 2007  . MULTIPLE TOOTH EXTRACTIONS    . SKIN GRAFT SPLIT THICKNESS LEG / FOOT Right 07/25/2016   LEG  . SKIN SPLIT GRAFT Right 07/27/2016   Procedure: SKIN GRAFT RIGHT LEG WITH THERASKIN APPLICATION;  Surgeon: [name], MD;  Location: [facility];  Service: Orthopedics;  Laterality: Right;  . TONSILLECTOMY     Social History   Occupational History  . Not on file  Tobacco Use  . Smoking status: Never Smoker  . Smokeless tobacco: Never Used  Substance and Sexual Activity  .  Alcohol use: No  . Drug use: No  . Sexual activity: Yes    Partners: Male    Birth control/protection: I.U.D.

## 2019-07-19 ENCOUNTER — Ambulatory Visit (INDEPENDENT_AMBULATORY_CARE_PROVIDER_SITE_OTHER): Payer: Medicare Other | Admitting: Family

## 2019-07-19 ENCOUNTER — Encounter: Payer: Self-pay | Admitting: Family

## 2019-07-19 ENCOUNTER — Other Ambulatory Visit: Payer: Self-pay

## 2019-07-19 VITALS — Ht 66.0 in | Wt 304.0 lb

## 2019-07-19 DIAGNOSIS — D508 Other iron deficiency anemias: Secondary | ICD-10-CM | POA: Diagnosis not present

## 2019-07-19 DIAGNOSIS — R7303 Prediabetes: Secondary | ICD-10-CM | POA: Diagnosis not present

## 2019-07-19 DIAGNOSIS — Z945 Skin transplant status: Secondary | ICD-10-CM | POA: Diagnosis not present

## 2019-07-19 DIAGNOSIS — N1831 Chronic kidney disease, stage 3a: Secondary | ICD-10-CM | POA: Diagnosis not present

## 2019-07-19 DIAGNOSIS — E559 Vitamin D deficiency, unspecified: Secondary | ICD-10-CM | POA: Diagnosis not present

## 2019-07-19 DIAGNOSIS — I10 Essential (primary) hypertension: Secondary | ICD-10-CM | POA: Diagnosis not present

## 2019-07-19 DIAGNOSIS — R1013 Epigastric pain: Secondary | ICD-10-CM | POA: Diagnosis not present

## 2019-07-19 DIAGNOSIS — G4733 Obstructive sleep apnea (adult) (pediatric): Secondary | ICD-10-CM | POA: Diagnosis not present

## 2019-07-19 DIAGNOSIS — G629 Polyneuropathy, unspecified: Secondary | ICD-10-CM | POA: Diagnosis not present

## 2019-07-19 DIAGNOSIS — F329 Major depressive disorder, single episode, unspecified: Secondary | ICD-10-CM | POA: Diagnosis not present

## 2019-07-19 MED ORDER — OXYCODONE HCL 10 MG PO TABS: 10.0000 mg | ORAL_TABLET | Freq: Two times a day (BID) | ORAL | 0 refills | Status: DC | PRN

## 2019-07-19 NOTE — Progress Notes (Signed)
Office Visit Note   Patient: Alice [name]           Date of Birth: [birth year]           MRN: [id number] Visit Date: 07/19/2019              Requested by: Benito Mccreedy, MD [address] [place],  [place] [address] PCP: Benito Mccreedy, MD  Chief Complaint  Patient presents with  . Right Leg - Follow-up      HPI: Patient is a 49 year old woman with chronic venous insufficiency ulcer right leg. Is status post skin graft. Has been showing marked improvement in ulcer. Has been in vive compression stockings with direct skin contact. Marked improvement since last month  Assessment & Plan: Visit Diagnoses:  No diagnosis found.  Plan: will continue vive compression stockings.   Follow-Up Instructions: No follow-ups on file.      Patient is alert, oriented, no adenopathy, well-dressed, normal affect, normal respiratory effort. Examination of the right leg patient has excellent granulation in the wound bed there is further epithelization around the wound edges. Wound is 2.5 x 1 cm in size. There is granulation tissue and surrounding dried drainage. 50% fibrinous tissue. No active drainage. No surrounding erythema See attached images.  Imaging: No results found. No images are attached to the encounter.  Labs: Lab Results  Component Value Date   REPTSTATUS 10/06/2018 FINAL 10/05/2018   GRAMSTAIN  09/28/2018    FEW WBC PRESENT, PREDOMINANTLY PMN FEW GRAM POSITIVE COCCI IN PAIRS FEW GRAM POSITIVE RODS    CULT 20,000 COLONIES/mL YEAST (A) 10/05/2018   LABORGA PROTEUS MIRABILIS 09/28/2018   LABORGA STAPHYLOCOCCUS CAPRAE 09/28/2018   LABORGA SERRATIA MARCESCENS 09/28/2018     Lab Results  Component Value Date   ALBUMIN 3.4 (L) 05/12/2019   ALBUMIN 2.5 (L) 10/08/2018   ALBUMIN 3.2 (L) 01/29/2018    Lab Results  Component Value Date   MG 1.8 12/29/2016   MG 1.9 12/28/2016   MG 1.9 05/17/2013   No results found for: VD25OH  No results  found for: PREALBUMIN CBC EXTENDED Latest Ref Rng & Units 05/12/2019 10/13/2018 10/12/2018  WBC 4.0 - 10.5 K/uL 5.3 8.4 -  RBC 3.87 - 5.11 MIL/uL 5.20(H) 3.92 2.91(L)  HGB 12.0 - 15.0 g/dL 14.4 9.9(L) 7.4(L)  HCT 36.0 - 46.0 % 43.1 30.3(L) 22.7(L)  PLT 150 - 400 K/uL 240 389 -  NEUTROABS 1.7 - 7.7 K/uL - - -  LYMPHSABS 0.7 - 4.0 K/uL - - -     Body mass index is 49.07 kg/m.  Orders:  No orders of the defined types were placed in this encounter.  No orders of the defined types were placed in this encounter.    Procedures: No procedures performed  Clinical Data: No additional findings.  ROS:  All other systems negative, except as noted in the HPI. Review of Systems  Constitutional: Negative for chills and fever.  Skin: Positive for wound. Negative for color change.    Objective: Vital Signs: Ht 5\' 6"  (1.676 m)   Wt (!) 304 lb (137.9 kg)   BMI 49.07 kg/m   Specialty Comments:  No specialty comments available.  PMFS History: Patient Active Problem List   Diagnosis Date Noted  . Subtherapeutic international normalized ratio (INR)   . Postoperative pain   . Hypoalbuminemia   . Acute on chronic kidney failure ([place])   . Acute blood loss anemia   . Benign essential HTN   .  Debility 10/05/2018  . Chronic ulcer of right leg, with fat layer exposed ([place]) 09/28/2018  . Chronic ulcer of leg, right, with necrosis of muscle ([place])   . Iron deficiency anemia 01/29/2018  . Pulmonary embolus ([place]) 01/09/2018  . Encounter for therapeutic drug monitoring 01/09/2018  . Severe malnutrition ([place]) 08/24/2017  . Acute kidney injury superimposed on CKD ([place]) 12/28/2016  . Hx of skin graft 08/01/2016  . 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 ([place]) 07/25/2016  . 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 ([place]) 07/05/2016  . Trichomonal infection 11/24/2015  . Abdominal pain 05/02/2015  . Symptomatic cholelithiasis  04/16/2015  . Acute bilateral upper abdominal pain 04/16/2015  . Microcytic anemia 05/18/2013  . Hypotension, unspecified 05/17/2013  . CKD (chronic kidney disease), stage III 05/17/2013  . Hypokalemia 05/17/2013  . Anal fissure 02/06/2013  . Anal skin tag 02/06/2013  . ALLERGIC RHINITIS 03/05/2010  . LOW BACK PAIN SYNDROME 12/13/2007  . OBESITY, MORBID 12/02/2006  . HTN (hypertension) 12/02/2006  . History of pulmonary embolism 12/02/2006  . History of DVT (deep vein thrombosis) 12/02/2006  . 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 GASTROESOPHAGEAL REFLUX DISEASE 12/02/2006  . OSA (obstructive sleep apnea) 12/02/2006   Past Medical History:  Diagnosis Date  . Anemia   . Anxiety   . Arthritis   . Chronic bronchitis ([place])   . Chronic upper back pain   . Depression   . DVT (deep venous thrombosis) ([facility])    BLE  . GERD (gastroesophageal reflux disease)   . Headache    "weekly" (04/16/2015)  . Hypertension   . Migraine    "monthly" (04/16/2015)  . Morbid obesity with BMI of 50.0-59.9, adult ([place])   . Obesity   . Pneumonia 2014  . Pulmonary embolism ([place])   . Sinusitis nasal   . Sleep apnea    "I'm suppose to wear a mask but I don't" (04/16/2015)  . Varicose veins of right lower extremity   . Venous stasis of lower extremity    right  . Wears dentures   . Wears glasses     Family History  Problem Relation Age of Onset  . Kidney disease Mother        kidney transplant    Past Surgical History:  Procedure Laterality Date  . CHOLECYSTECTOMY N/A 04/18/2015   Procedure: LAPAROSCOPIC CHOLECYSTECTOMY;  Surgeon: Ralene Ok, MD;  Location: [facility];  Service: General;  Laterality: N/A;  . HYSTEROSCOPY WITH D & C  12/24/2001   Archie Endo 09/28/2010  . I & D EXTREMITY Right 07/25/2016   Procedure: IRRIGATION AND DEBRIDEMENT RIGHT LEG ULCER, APPLY VERAFLO VAC;  Surgeon: [name], MD;  Location: [facility];  Service: Orthopedics;  Laterality: Right;  . I & D EXTREMITY  Right 09/28/2018   Procedure: DEBRIDEMENT RIGHT LOWER LEG WITH PLACEMENT WITH PLACEMENT OF SKIN GRAFT AND VAC;  Surgeon: [name], MD;  Location: [facility];  Service: Orthopedics;  Laterality: Right;  . I & D EXTREMITY Right 10/03/2018   Procedure: REPEAT IRRIGATION AND DEBRIDEMENT RIGHT LOWER LEG, VAC PLACEMENT;  Surgeon: [name], MD;  Location: [facility];  Service: Orthopedics;  Laterality: Right;  . INCISE AND DRAIN ABCESS Right 07/14/2016  . INCISION AND DRAINAGE Right 09/10/2008   leg:  skin and soft tissue and muscle/notes 09/15/2010  . INCISION AND DRAINAGE Right 01/01/2008   Chronic venous stasis insufficiency ulcer,/notes 09/14/2010/  . INCISION AND  DRAINAGE Right 09/6718   calf w/application wound vac/notes 07/20/2010  . INCISION AND DRAINAGE Right 07/25/2016   IRRIGATION AND DEBRIDEMENT RIGHT LEG ULCER,  . LAPAROSCOPIC GASTRIC BYPASS  ~ 2007  . MULTIPLE TOOTH EXTRACTIONS    . SKIN GRAFT SPLIT THICKNESS LEG / FOOT Right 07/25/2016   LEG  . SKIN SPLIT GRAFT Right 07/27/2016   Procedure: SKIN GRAFT RIGHT LEG WITH THERASKIN APPLICATION;  Surgeon: [name], MD;  Location: [facility];  Service: Orthopedics;  Laterality: Right;  . TONSILLECTOMY     Social History   Occupational History  . Not on file  Tobacco Use  . Smoking status: Never Smoker  . Smokeless tobacco: Never Used  Substance and Sexual Activity  . Alcohol use: No  . Drug use: No  . Sexual activity: Yes    Partners: Male    Birth control/protection: I.U.D.

## 2019-08-03 ENCOUNTER — Other Ambulatory Visit: Payer: Self-pay | Admitting: Cardiology

## 2019-08-05 NOTE — Telephone Encounter (Signed)
Prescription refill request for Eliquis received.  Last office visit: Alice [name] Scr: 1.60, 05/12/2019 Age: 48 y.o. Weight: 137.9 kg   Pt is overdue for an office visit.

## 2019-08-06 NOTE — Telephone Encounter (Signed)
Called and spoke to pt. Made her aware for [place] to keep refilling her medication pt would need to schedule an appointment with a cardiologist transferred pt to the main line to schedule an appointment.

## 2019-08-07 NOTE — Telephone Encounter (Signed)
Pt is aware needs to see MD for refills on her Eliquis.  Refilled rx x 3 months in 04/2019 with note to call office to schedule OV overdue for follow-up, which she has not done.  Pt was contacted again on 08/06/19 by our office made aware must see MD for refills, transferred to schedulers to make appt, but pt failed to make appt.  No appointments pending for pt.  Will deny pt refill at pharmacy as she has not seen Dr Meda Coffee or a provider in our office since 03/09/2017. Will refill rx at appt when pt schedules.

## 2019-08-19 ENCOUNTER — Emergency Department (HOSPITAL_COMMUNITY)
Admission: EM | Admit: 2019-08-19 | Discharge: 2019-08-19 | Disposition: A | Discharge status: Home / Self Care | Payer: Medicare Other | Attending: Emergency Medicine | Admitting: Emergency Medicine

## 2019-08-19 ENCOUNTER — Emergency Department (HOSPITAL_COMMUNITY): Payer: Medicare Other

## 2019-08-19 ENCOUNTER — Other Ambulatory Visit: Payer: Self-pay

## 2019-08-19 DIAGNOSIS — Z79899 Other long term (current) drug therapy: Secondary | ICD-10-CM | POA: Diagnosis not present

## 2019-08-19 DIAGNOSIS — T24011A Burn of unspecified degree of right thigh, initial encounter: Secondary | ICD-10-CM | POA: Diagnosis not present

## 2019-08-19 DIAGNOSIS — I129 Hypertensive chronic kidney disease with stage 1 through stage 4 chronic kidney disease, or unspecified chronic kidney disease: Secondary | ICD-10-CM | POA: Insufficient documentation

## 2019-08-19 DIAGNOSIS — T24212A Burn of second degree of left thigh, initial encounter: Secondary | ICD-10-CM | POA: Diagnosis not present

## 2019-08-19 DIAGNOSIS — T310 Burns involving less than 10% of body surface: Secondary | ICD-10-CM | POA: Diagnosis not present

## 2019-08-19 DIAGNOSIS — R008 Other abnormalities of heart beat: Secondary | ICD-10-CM | POA: Diagnosis not present

## 2019-08-19 DIAGNOSIS — Y92010 Kitchen of single-family (private) house as the place of occurrence of the external cause: Secondary | ICD-10-CM | POA: Diagnosis not present

## 2019-08-19 DIAGNOSIS — T24211A Burn of second degree of right thigh, initial encounter: Secondary | ICD-10-CM | POA: Diagnosis not present

## 2019-08-19 DIAGNOSIS — Y93G1 Activity, food preparation and clean up: Secondary | ICD-10-CM | POA: Insufficient documentation

## 2019-08-19 DIAGNOSIS — N183 Chronic kidney disease, stage 3 unspecified: Secondary | ICD-10-CM | POA: Insufficient documentation

## 2019-08-19 DIAGNOSIS — Z23 Encounter for immunization: Secondary | ICD-10-CM | POA: Insufficient documentation

## 2019-08-19 DIAGNOSIS — T24012A Burn of unspecified degree of left thigh, initial encounter: Secondary | ICD-10-CM | POA: Diagnosis not present

## 2019-08-19 DIAGNOSIS — Y998 Other external cause status: Secondary | ICD-10-CM | POA: Diagnosis not present

## 2019-08-19 DIAGNOSIS — X118XXA Contact with other hot tap-water, initial encounter: Secondary | ICD-10-CM | POA: Insufficient documentation

## 2019-08-19 DIAGNOSIS — Z7901 Long term (current) use of anticoagulants: Secondary | ICD-10-CM | POA: Diagnosis not present

## 2019-08-19 DIAGNOSIS — R079 Chest pain, unspecified: Secondary | ICD-10-CM | POA: Diagnosis not present

## 2019-08-19 DIAGNOSIS — I498 Other specified cardiac arrhythmias: Secondary | ICD-10-CM | POA: Diagnosis not present

## 2019-08-19 DIAGNOSIS — I959 Hypotension, unspecified: Secondary | ICD-10-CM | POA: Diagnosis not present

## 2019-08-19 DIAGNOSIS — E876 Hypokalemia: Secondary | ICD-10-CM | POA: Diagnosis not present

## 2019-08-19 DIAGNOSIS — I10 Essential (primary) hypertension: Secondary | ICD-10-CM | POA: Diagnosis not present

## 2019-08-19 DIAGNOSIS — R531 Weakness: Secondary | ICD-10-CM | POA: Diagnosis not present

## 2019-08-19 DIAGNOSIS — T300 Burn of unspecified body region, unspecified degree: Secondary | ICD-10-CM

## 2019-08-19 DIAGNOSIS — R52 Pain, unspecified: Secondary | ICD-10-CM | POA: Diagnosis not present

## 2019-08-19 LAB — BASIC METABOLIC PANEL
Anion gap: 11 (ref 5–15)
BUN: 28 mg/dL — ABNORMAL HIGH (ref 6–20)
CO2: 23 mmol/L (ref 22–32)
Calcium: 8.5 mg/dL — ABNORMAL LOW (ref 8.9–10.3)
Chloride: 104 mmol/L (ref 98–111)
Creatinine, Ser: 1.88 mg/dL — ABNORMAL HIGH (ref 0.44–1.00)
GFR calc Af Amer: 36 mL/min — ABNORMAL LOW (ref >60)
GFR calc non Af Amer: 31 mL/min — ABNORMAL LOW (ref >60)
Glucose, Bld: 121 mg/dL — ABNORMAL HIGH (ref 70–99)
Potassium: 3.1 mmol/L — ABNORMAL LOW (ref 3.5–5.1)
Sodium: 138 mmol/L (ref 135–145)

## 2019-08-19 LAB — CBC WITH DIFFERENTIAL/PLATELET
Abs Immature Granulocytes: 0.03 10*3/uL (ref 0.00–0.07)
Basophils Absolute: 0 10*3/uL (ref 0.0–0.1)
Basophils Relative: 1 %
Eosinophils Absolute: 0.1 10*3/uL (ref 0.0–0.5)
Eosinophils Relative: 2 %
HCT: 36.7 % (ref 36.0–46.0)
Hemoglobin: 12.3 g/dL (ref 12.0–15.0)
Immature Granulocytes: 0 %
Lymphocytes Relative: 20 %
Lymphs Abs: 1.5 10*3/uL (ref 0.7–4.0)
MCH: 28.1 pg (ref 26.0–34.0)
MCHC: 33.5 g/dL (ref 30.0–36.0)
MCV: 84 fL (ref 80.0–100.0)
Monocytes Absolute: 0.7 10*3/uL (ref 0.1–1.0)
Monocytes Relative: 9 %
Neutro Abs: 5.2 10*3/uL (ref 1.7–7.7)
Neutrophils Relative %: 68 %
Platelets: 236 10*3/uL (ref 150–400)
RBC: 4.37 MIL/uL (ref 3.87–5.11)
RDW: 14.1 % (ref 11.5–15.5)
WBC: 7.5 10*3/uL (ref 4.0–10.5)
nRBC: 0 % (ref 0.0–0.2)

## 2019-08-19 LAB — I-STAT BETA HCG BLOOD, ED (MC, WL, AP ONLY): I-stat hCG, quantitative: <5 m[IU]/mL (ref <5)

## 2019-08-19 LAB — LACTIC ACID, PLASMA
Lactic Acid, Venous: 0.9 mmol/L (ref 0.5–1.9)
Lactic Acid, Venous: 1.1 mmol/L (ref 0.5–1.9)

## 2019-08-19 LAB — MAGNESIUM: Magnesium: 1.9 mg/dL (ref 1.7–2.4)

## 2019-08-19 MED ORDER — TETANUS-DIPHTH-ACELL PERTUSSIS 5-2.5-18.5 LF-MCG/0.5 IM SUSP
0.5000 mL | Freq: Once | INTRAMUSCULAR | Status: AC
  Administered 2019-08-19: 0.5 mL via INTRAMUSCULAR
  Filled 2019-08-19: qty 0.5

## 2019-08-19 MED ORDER — FENTANYL CITRATE (PF) 100 MCG/2ML IJ SOLN
50.0000 ug | Freq: Once | INTRAMUSCULAR | Status: AC
  Administered 2019-08-19: 50 ug via INTRAVENOUS
  Filled 2019-08-19: qty 2

## 2019-08-19 MED ORDER — FENTANYL CITRATE (PF) 100 MCG/2ML IJ SOLN
INTRAMUSCULAR | Status: AC
  Administered 2019-08-19: 100 ug
  Filled 2019-08-19: qty 2

## 2019-08-19 MED ORDER — SODIUM CHLORIDE 0.9 % IV BOLUS
1000.0000 mL | Freq: Once | INTRAVENOUS | Status: AC
  Administered 2019-08-19: 1000 mL via INTRAVENOUS

## 2019-08-19 MED ORDER — ALUM & MAG HYDROXIDE-SIMETH 200-200-20 MG/5ML PO SUSP
30.0000 mL | Freq: Once | ORAL | Status: AC
  Administered 2019-08-19: 30 mL via ORAL
  Filled 2019-08-19: qty 30

## 2019-08-19 MED ORDER — OXYCODONE-ACETAMINOPHEN 5-325 MG PO TABS: 1.0000 | ORAL_TABLET | ORAL | 0 refills | Status: DC | PRN

## 2019-08-19 MED ORDER — POTASSIUM CHLORIDE CRYS ER 20 MEQ PO TBCR
40.0000 meq | EXTENDED_RELEASE_TABLET | Freq: Once | ORAL | Status: AC
  Administered 2019-08-19: 40 meq via ORAL
  Filled 2019-08-19: qty 2

## 2019-08-19 MED ORDER — LIDOCAINE VISCOUS HCL 2 % MT SOLN
15.0000 mL | Freq: Once | OROMUCOSAL | Status: AC
  Administered 2019-08-19: 15 mL via ORAL
  Filled 2019-08-19: qty 15

## 2019-08-19 MED ORDER — ONDANSETRON HCL 4 MG/2ML IJ SOLN
4.0000 mg | Freq: Once | INTRAMUSCULAR | Status: AC
  Administered 2019-08-19: 4 mg via INTRAVENOUS
  Filled 2019-08-19: qty 2

## 2019-08-19 NOTE — ED Notes (Signed)
I informed EDMD of pt request. EDMD stated he will go talk with her.
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
## 2019-08-19 NOTE — ED Notes (Signed)
Pt had called out and stated she wanted to talk to the doctor. I told the pt he was in with another pt and if there was anything I can tell him for her. Pt stated that she wanted to go home. I told pt I will let him know. Pt then wanted me to help her take off her gown. Pt didn't want another gown. She wanted her clothes instead. I offered pt a blanket pt said no. Pt is now in room with nothing on but socks.
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
## 2019-08-20 ENCOUNTER — Ambulatory Visit: Payer: Self-pay

## 2019-08-20 ENCOUNTER — Ambulatory Visit (INDEPENDENT_AMBULATORY_CARE_PROVIDER_SITE_OTHER): Payer: Medicare Other | Admitting: Family

## 2019-08-20 ENCOUNTER — Encounter: Payer: Self-pay | Admitting: Family

## 2019-08-20 VITALS — Ht 66.0 in | Wt 304.0 lb

## 2019-08-20 DIAGNOSIS — F329 Major depressive disorder, single episode, unspecified: Secondary | ICD-10-CM | POA: Diagnosis not present

## 2019-08-20 DIAGNOSIS — L97912 Non-pressure chronic ulcer of unspecified part of right lower leg with fat layer exposed: Secondary | ICD-10-CM

## 2019-08-20 DIAGNOSIS — I10 Essential (primary) hypertension: Secondary | ICD-10-CM | POA: Diagnosis not present

## 2019-08-20 DIAGNOSIS — N1831 Chronic kidney disease, stage 3a: Secondary | ICD-10-CM | POA: Diagnosis not present

## 2019-08-20 DIAGNOSIS — R1013 Epigastric pain: Secondary | ICD-10-CM | POA: Diagnosis not present

## 2019-08-20 DIAGNOSIS — G629 Polyneuropathy, unspecified: Secondary | ICD-10-CM | POA: Diagnosis not present

## 2019-08-20 DIAGNOSIS — R7303 Prediabetes: Secondary | ICD-10-CM | POA: Diagnosis not present

## 2019-08-20 DIAGNOSIS — M5416 Radiculopathy, lumbar region: Secondary | ICD-10-CM | POA: Diagnosis not present

## 2019-08-20 DIAGNOSIS — D508 Other iron deficiency anemias: Secondary | ICD-10-CM | POA: Diagnosis not present

## 2019-08-20 DIAGNOSIS — E559 Vitamin D deficiency, unspecified: Secondary | ICD-10-CM | POA: Diagnosis not present

## 2019-08-20 DIAGNOSIS — G4733 Obstructive sleep apnea (adult) (pediatric): Secondary | ICD-10-CM | POA: Diagnosis not present

## 2019-08-20 DIAGNOSIS — T300 Burn of unspecified body region, unspecified degree: Secondary | ICD-10-CM | POA: Diagnosis not present

## 2019-08-20 MED ORDER — PREDNISONE 50 MG PO TABS: ORAL_TABLET | ORAL | 0 refills | Status: DC
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
## 2019-08-21 ENCOUNTER — Other Ambulatory Visit: Payer: Self-pay

## 2019-08-21 ENCOUNTER — Ambulatory Visit (INDEPENDENT_AMBULATORY_CARE_PROVIDER_SITE_OTHER): Payer: Medicare Other | Admitting: Internal Medicine

## 2019-08-21 DIAGNOSIS — T24202A Burn of second degree of unspecified site of left lower limb, except ankle and foot, initial encounter: Secondary | ICD-10-CM

## 2019-08-21 MED ORDER — SILVER SULFADIAZINE 1 % EX CREA: 1.0000 "application " | TOPICAL_CREAM | Freq: Every day | CUTANEOUS | 0 refills | Status: DC

## 2019-08-21 NOTE — Patient Instructions (Signed)
Alice [name],  It was a pleasure meeting you today.  Please use the silvadene cream that was sent to your pharmacy on your wounds daily with dressing changes.  Please follow up in 1 week
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
## 2019-08-28 ENCOUNTER — Encounter: Payer: Self-pay | Admitting: Internal Medicine

## 2019-08-28 ENCOUNTER — Ambulatory Visit (INDEPENDENT_AMBULATORY_CARE_PROVIDER_SITE_OTHER): Payer: Medicare Other | Admitting: Internal Medicine

## 2019-08-28 ENCOUNTER — Other Ambulatory Visit: Payer: Self-pay

## 2019-08-28 VITALS — BP 126/86 | HR 67 | Temp 98.4°F | Ht 68.0 in

## 2019-08-28 DIAGNOSIS — T24202D Burn of second degree of unspecified site of left lower limb, except ankle and foot, subsequent encounter: Secondary | ICD-10-CM

## 2019-08-28 MED ORDER — OXYCODONE HCL 10 MG PO TABS: 10.0000 mg | ORAL_TABLET | Freq: Four times a day (QID) | ORAL | 0 refills | Status: AC | PRN

## 2019-08-28 MED ORDER — SILVER SULFADIAZINE 1 % EX CREA: 1.0000 "application " | TOPICAL_CREAM | Freq: Every day | CUTANEOUS | 0 refills | Status: DC
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
## 2019-08-28 NOTE — Patient Instructions (Signed)
Ms. Gilchrest,  Please continue to use silvadene on the wound daily.  Please follow up in 1 week

## 2019-08-29 ENCOUNTER — Telehealth: Payer: Self-pay | Admitting: *Deleted

## 2019-08-29 NOTE — Telephone Encounter (Signed)
Received Order Status Notification from [facility] stating that [facility] has provided service for the patient; no further action is required.//AB/CMA

## 2019-08-29 NOTE — Telephone Encounter (Signed)
Faxed order on (08/28/19) to [facility] for supplies for the patient.  Confirmation received and copy scanned into the chart.//AB/CMA

## 2019-09-04 ENCOUNTER — Ambulatory Visit (INDEPENDENT_AMBULATORY_CARE_PROVIDER_SITE_OTHER): Payer: Medicare Other | Admitting: Internal Medicine

## 2019-09-04 ENCOUNTER — Encounter: Payer: Self-pay | Admitting: Internal Medicine

## 2019-09-04 ENCOUNTER — Other Ambulatory Visit: Payer: Self-pay

## 2019-09-04 VITALS — BP 122/82 | HR 49 | Temp 97.1°F | Ht 68.0 in | Wt 304.0 lb

## 2019-09-04 DIAGNOSIS — T24202D Burn of second degree of unspecified site of left lower limb, except ankle and foot, subsequent encounter: Secondary | ICD-10-CM
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
## 2019-09-06 ENCOUNTER — Telehealth: Payer: Self-pay | Admitting: *Deleted

## 2019-09-06 NOTE — Telephone Encounter (Signed)
Faxed order on (09/04/19) to [facility] for supplies for the patient.  Confirmation received.    On (09/05/19) received Order Status Notification from [facility] stating:[facility] has provided service for the patient; no further action is required.//AB/CMA
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
## 2019-09-10 ENCOUNTER — Encounter: Payer: Self-pay | Admitting: Internal Medicine

## 2019-09-10 ENCOUNTER — Ambulatory Visit (INDEPENDENT_AMBULATORY_CARE_PROVIDER_SITE_OTHER): Payer: Medicare Other | Admitting: Internal Medicine

## 2019-09-10 ENCOUNTER — Other Ambulatory Visit: Payer: Self-pay

## 2019-09-10 VITALS — BP 132/90 | HR 83 | Temp 97.1°F | Ht 68.0 in | Wt 300.0 lb

## 2019-09-10 DIAGNOSIS — T24202D Burn of second degree of unspecified site of left lower limb, except ankle and foot, subsequent encounter: Secondary | ICD-10-CM

## 2019-09-13 ENCOUNTER — Encounter: Payer: Self-pay | Admitting: Family

## 2019-09-13 ENCOUNTER — Ambulatory Visit (INDEPENDENT_AMBULATORY_CARE_PROVIDER_SITE_OTHER): Payer: Medicare Other | Admitting: Family

## 2019-09-13 ENCOUNTER — Other Ambulatory Visit: Payer: Self-pay

## 2019-09-13 DIAGNOSIS — L97919 Non-pressure chronic ulcer of unspecified part of right lower leg with unspecified severity: Secondary | ICD-10-CM

## 2019-09-13 DIAGNOSIS — Z945 Skin transplant status: Secondary | ICD-10-CM

## 2019-09-13 DIAGNOSIS — I87331 Chronic venous hypertension (idiopathic) with ulcer and inflammation of right lower extremity: Secondary | ICD-10-CM | POA: Diagnosis not present

## 2019-09-13 DIAGNOSIS — L97912 Non-pressure chronic ulcer of unspecified part of right lower leg with fat layer exposed: Secondary | ICD-10-CM

## 2019-09-13 DIAGNOSIS — M5416 Radiculopathy, lumbar region: Secondary | ICD-10-CM

## 2019-09-13 MED ORDER — OXYCODONE-ACETAMINOPHEN 5-325 MG PO TABS: 1.0000 | ORAL_TABLET | Freq: Four times a day (QID) | ORAL | 0 refills | Status: DC | PRN
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
## 2019-09-20 ENCOUNTER — Telehealth: Payer: Self-pay | Admitting: Family

## 2019-09-20 ENCOUNTER — Ambulatory Visit: Payer: Medicare Other | Admitting: Family

## 2019-09-20 NOTE — Telephone Encounter (Signed)
Patient called this morning requesting an RX refill on her Oxycodone.  She also stated that the Percocet does nothing for her pain.  She is wanting to know if she can get the 10's.  Patient missed her appointment this morning due to her back hurting so much.  CB#[phone].  Thank you.

## 2019-09-20 NOTE — Telephone Encounter (Signed)
Pt was in the office for LBP last week referral amde for Dr. Ernestina Patches. Please see message below about pain medication request.

## 2019-09-23 ENCOUNTER — Other Ambulatory Visit: Payer: Self-pay

## 2019-09-23 MED ORDER — PREDNISONE 10 MG PO TABS: ORAL_TABLET | ORAL | 0 refills | Status: DC

## 2019-09-23 NOTE — Telephone Encounter (Signed)
Recommend 20 mg of prednisone with breakfast a day for the back pain.  Increase narcotics will not help the back symptoms.

## 2019-09-23 NOTE — Telephone Encounter (Signed)
Called pt to advise of message below. Voiced understanding and fax rx to pharm. The pt has an appt with FN later this month. Will call with any questions.

## 2019-10-07 ENCOUNTER — Other Ambulatory Visit: Payer: Self-pay

## 2019-10-07 ENCOUNTER — Ambulatory Visit: Payer: Self-pay

## 2019-10-07 ENCOUNTER — Ambulatory Visit (INDEPENDENT_AMBULATORY_CARE_PROVIDER_SITE_OTHER): Payer: Medicare Other | Admitting: Physical Medicine and Rehabilitation

## 2019-10-07 ENCOUNTER — Encounter: Payer: Self-pay | Admitting: Physical Medicine and Rehabilitation

## 2019-10-07 VITALS — BP 119/61 | HR 45

## 2019-10-07 DIAGNOSIS — M5416 Radiculopathy, lumbar region: Secondary | ICD-10-CM

## 2019-10-07 DIAGNOSIS — M48062 Spinal stenosis, lumbar region with neurogenic claudication: Secondary | ICD-10-CM

## 2019-10-07 MED ORDER — METHYLPREDNISOLONE ACETATE 80 MG/ML IJ SUSP
40.0000 mg | Freq: Once | INTRAMUSCULAR | Status: AC
  Administered 2019-10-07: 40 mg

## 2019-10-07 NOTE — Progress Notes (Signed)
Numeric Pain Rating Scale and Functional Assessment Average Pain 10   In the last MONTH (on 0-10 scale) has pain interfered with the following?  1. General activity like being  able to carry out your everyday physical activities such as walking, climbing stairs, carrying groceries, or moving a chair?  Rating(10)   +Driver, +BT, -Dye Allergies. --taking coumadin  Pain in low back and into hips and down legs to knees

## 2019-10-08 NOTE — Procedures (Signed)
Lumbosacral Transforaminal Epidural Steroid Injection - Sub-Pedicular Approach with Fluoroscopic Guidance  Patient: Alice [name]      Date of Birth: 11-Jul-1970 MRN: [id number] PCP: Benito Mccreedy, MD      Visit Date: 10/07/2019   Universal Protocol:    Date/Time: 10/07/2019  Consent Given By: the patient  Position: PRONE  Additional Comments: Vital signs were monitored before and after the procedure. Patient was prepped and draped in the usual sterile fashion. The correct patient, procedure, and site was verified.   Injection Procedure Details:  Procedure Site One Meds Administered:  Meds ordered this encounter  Medications  . methylPREDNISolone acetate (DEPO-MEDROL) injection 40 mg    Laterality: Right  Location/Site:  L4-L5  Needle size: 22 G  Needle type: Spinal  Needle Placement: Transforaminal  Findings:    -Comments: Excellent flow of contrast along the nerve and into the epidural space.  Procedure Details: After squaring off the end-plates to get a true AP view, the C-arm was positioned so that an oblique view of the foramen as noted above was visualized. The target area is just inferior to the "nose of the scotty dog" or sub pedicular. The soft tissues overlying this structure were infiltrated with 2-3 ml. of 1% Lidocaine without Epinephrine.  The spinal needle was inserted toward the target using a "trajectory" view along the fluoroscope beam.  Under AP and lateral visualization, the needle was advanced so it did not puncture dura and was located close the 6 O'Clock position of the pedical in AP tracterory. Biplanar projections were used to confirm position. Aspiration was confirmed to be negative for CSF and/or blood. A 1-2 ml. volume of Isovue-250 was injected and flow of contrast was noted at each level. Radiographs were obtained for documentation purposes.   After attaining the desired flow of contrast documented above, a 0.5 to 1.0 ml test dose  of 0.25% Marcaine was injected into each respective transforaminal space.  The patient was observed for 90 seconds post injection.  After no sensory deficits were reported, and normal lower extremity motor function was noted,   the above injectate was administered so that equal amounts of the injectate were placed at each foramen (level) into the transforaminal epidural space.   Additional Comments:  The patient tolerated the procedure well Dressing: 2 x 2 sterile gauze and Band-Aid    Post-procedure details: Patient was observed during the procedure. Post-procedure instructions were reviewed.  Patient left the clinic in stable condition.

## 2019-10-08 NOTE — Progress Notes (Signed)
Alice [name] - 49 y.o. female MRN [id number]  Date of birth: [birth year]  Office Visit Note: Visit Date: 10/07/2019 PCP: Benito Mccreedy, MD Referred by: Benito Mccreedy, MD  Subjective: Chief Complaint  Patient presents with  . Lower Back - Pain   HPI: Alice [name] is a 49 y.o. female who comes in today for planned Right L4-L5 lumbar epidural steroid injection with fluoroscopic guidance.  The patient has failed conservative care including home exercise, medications, time and activity modification.  This injection will be diagnostic and hopefully therapeutic.  Please see requesting physician notes for further details and justification.   Patient with several week history of right radicular leg pain in the setting of prior MRI showing moderately severe severe stenosis multifactorial 3-4 with listhesis and moderate stenosis at L4-5.  Prior injection 2019 was very beneficial at the L3 position.  ROS Otherwise per HPI.  Assessment & Plan: Visit Diagnoses:  1. Lumbar radiculopathy   2. Spinal stenosis of lumbar region with neurogenic claudication     Plan: No additional findings.   Meds & Orders:  Meds ordered this encounter  Medications  . methylPREDNISolone acetate (DEPO-MEDROL) injection 40 mg    Orders Placed This Encounter  Procedures  . XR C-ARM NO REPORT  . Epidural Steroid injection    Follow-up: Return if symptoms worsen or fail to improve, for Consider L3 transforaminal epidural steroid injection..   Procedures: No procedures performed  Lumbosacral Transforaminal Epidural Steroid Injection - Sub-Pedicular Approach with Fluoroscopic Guidance  Patient: Alice [name]      Date of Birth: [birth year] MRN: [id number] PCP: Benito Mccreedy, MD      Visit Date: 10/07/2019   Universal Protocol:    Date/Time: 10/07/2019  Consent Given By: the patient  Position: PRONE  Additional Comments: Vital signs were monitored before and after the  procedure. Patient was prepped and draped in the usual sterile fashion. The correct patient, procedure, and site was verified.   Injection Procedure Details:  Procedure Site One Meds Administered:  Meds ordered this encounter  Medications  . methylPREDNISolone acetate (DEPO-MEDROL) injection 40 mg    Laterality: Right  Location/Site:  L4-L5  Needle size: 22 G  Needle type: Spinal  Needle Placement: Transforaminal  Findings:    -Comments: Excellent flow of contrast along the nerve and into the epidural space.  Procedure Details: After squaring off the end-plates to get a true AP view, the C-arm was positioned so that an oblique view of the foramen as noted above was visualized. The target area is just inferior to the "nose of the scotty dog" or sub pedicular. The soft tissues overlying this structure were infiltrated with 2-3 ml. of 1% Lidocaine without Epinephrine.  The spinal needle was inserted toward the target using a "trajectory" view along the fluoroscope beam.  Under AP and lateral visualization, the needle was advanced so it did not puncture dura and was located close the 6 O'Clock position of the pedical in AP tracterory. Biplanar projections were used to confirm position. Aspiration was confirmed to be negative for CSF and/or blood. A 1-2 ml. volume of Isovue-250 was injected and flow of contrast was noted at each level. Radiographs were obtained for documentation purposes.   After attaining the desired flow of contrast documented above, a 0.5 to 1.0 ml test dose of 0.25% Marcaine was injected into each respective transforaminal space.  The patient was observed for 90 seconds post injection.  After no sensory deficits were reported, and normal  lower extremity motor function was noted,   the above injectate was administered so that equal amounts of the injectate were placed at each foramen (level) into the transforaminal epidural space.   Additional Comments:  The  patient tolerated the procedure well Dressing: 2 x 2 sterile gauze and Band-Aid    Post-procedure details: Patient was observed during the procedure. Post-procedure instructions were reviewed.  Patient left the clinic in stable condition.      Clinical History: MRI LUMBAR SPINE WITHOUT CONTRAST  TECHNIQUE: Multiplanar, multisequence MR imaging of the lumbar spine was performed. No intravenous contrast was administered.  COMPARISON:  10/10/2017 lumbar spine radiographs  FINDINGS: Segmentation:  Standard.  Alignment:  L3-4 grade 1 anterolisthesis.  Vertebrae: No fracture, evidence of discitis, or bone lesion. L4-5 and L5-S1 Modic 2 degenerative changes.  Conus medullaris and cauda equina: Conus extends to the L2 level. Conus and cauda equina appear normal.  Paraspinal and other soft tissues: Negative.  Disc levels:  L1-2: No significant disc displacement, foraminal stenosis, or canal stenosis.  L2-3: Minimal disc bulge. No significant foraminal or canal stenosis.  L3-4: Grade 1 anterolisthesis with small uncovered disc bulge and left-greater-than-right facet hypertrophy. Moderate bilateral foraminal stenosis. Moderate to severe canal stenosis. Small bilateral facet effusions.  L4-5: Diffuse disc bulge with endplate marginal osteophytes, small central disc protrusion, moderate bilateral facet hypertrophy, and ligamentum flavum hypertrophy. Moderate bilateral foraminal stenosis. Moderate canal stenosis. The central protrusion narrows the lateral recesses with contact on descending L5 nerve roots.  L5-S1: Diffuse disc bulge and endplate marginal osteophytes with mild bilateral facet hypertrophy. Moderate bilateral foraminal stenosis. No significant canal stenosis.  IMPRESSION: 1. L3-4 grade 1 anterolisthesis with prominent facet hypertrophy. No acute osseous abnormality. 2. Age advanced lumbar spondylosis predominantly at the L3-4 through L5-S1  levels. 3. L3-4 moderate to severe and L4-5 moderate multifactorial canal stenosis. 4. Moderate bilateral foraminal stenosis at the L3-4, L4-5, and L5-S1 levels.   Electronically Signed   By: Kristine Garbe M.D.   On: 11/15/2017 04:38   She reports that she has never smoked. She has never used smokeless tobacco. No results for input(s): HGBA1C, LABURIC in the last 8760 hours.  Objective:  VS:  HT:    WT:   BMI:     BP:119/61  HR:(!) 45bpm  TEMP: ( )  RESP:  Physical Exam Constitutional:      General: She is not in acute distress.    Appearance: Normal appearance. She is not ill-appearing.  HENT:     Head: Normocephalic and atraumatic.     Right Ear: External ear normal.     Left Ear: External ear normal.  Eyes:     Extraocular Movements: Extraocular movements intact.  Cardiovascular:     Rate and Rhythm: Normal rate.     Pulses: Normal pulses.  Musculoskeletal:     Right lower leg: No edema.     Left lower leg: No edema.     Comments: Patient has good distal strength with no pain over the greater trochanters.  No clonus or focal weakness.  Skin:    Findings: No erythema, lesion or rash.  Neurological:     General: No focal deficit present.     Mental Status: She is alert and oriented to person, place, and time.     Sensory: No sensory deficit.     Motor: No weakness or abnormal muscle tone.     Coordination: Coordination normal.  Psychiatric:        Mood  and Affect: Mood normal.        Behavior: Behavior normal.     Ortho Exam  Imaging: XR C-ARM NO REPORT  Result Date: 10/07/2019 Please see Notes tab for imaging impression.   Past Medical/Family/Surgical/Social History: Medications & Allergies reviewed per EMR, new medications updated. Patient Active Problem List   Diagnosis Date Noted  . Subtherapeutic international normalized ratio (INR)   . Postoperative pain   . Hypoalbuminemia   . Acute on chronic kidney failure ([place])   . Acute blood  loss anemia   . Benign essential HTN   . Debility 10/05/2018  . Chronic ulcer of right leg, with fat layer exposed ([place]) 09/28/2018  . Chronic ulcer of leg, right, with necrosis of muscle ([place])   . Iron deficiency anemia 01/29/2018  . Pulmonary embolus ([place]) 01/09/2018  . Encounter for therapeutic drug monitoring 01/09/2018  . Severe malnutrition ([place]) 08/24/2017  . Acute kidney injury superimposed on CKD ([place]) 12/28/2016  . Hx of skin graft 08/01/2016  . 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 ([place]) 07/25/2016  . 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 ([place]) 07/05/2016  . Trichomonal infection 11/24/2015  . Abdominal pain 05/02/2015  . Symptomatic cholelithiasis 04/16/2015  . Acute bilateral upper abdominal pain 04/16/2015  . Microcytic anemia 05/18/2013  . Hypotension, unspecified 05/17/2013  . CKD (chronic kidney disease), stage III 05/17/2013  . Hypokalemia 05/17/2013  . Anal fissure 02/06/2013  . Anal skin tag 02/06/2013  . ALLERGIC RHINITIS 03/05/2010  . LOW BACK PAIN SYNDROME 12/13/2007  . OBESITY, MORBID 12/02/2006  . HTN (hypertension) 12/02/2006  . History of pulmonary embolism 12/02/2006  . History of DVT (deep vein thrombosis) 12/02/2006  . 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 GASTROESOPHAGEAL REFLUX DISEASE 12/02/2006  . OSA (obstructive sleep apnea) 12/02/2006   Past Medical History:  Diagnosis Date  . Anemia   . Anxiety   . Arthritis   . Chronic bronchitis ([place])   . Chronic upper back pain   . Depression   . DVT (deep venous thrombosis) ([facility])    BLE  . GERD (gastroesophageal reflux disease)   . Headache    "weekly" (04/16/2015)  . Hypertension   . Migraine    "monthly" (04/16/2015)  . Morbid obesity with BMI of 50.0-59.9, adult ([place])   . Obesity   . Pneumonia 2014  . Pulmonary embolism ([place])   . Sinusitis nasal   . Sleep apnea    "I'm suppose to wear a mask but I  don't" (04/16/2015)  . Varicose veins of right lower extremity   . Venous stasis of lower extremity    right  . Wears dentures   . Wears glasses    Family History  Problem Relation Age of Onset  . Kidney disease Mother        kidney transplant   Past Surgical History:  Procedure Laterality Date  . CHOLECYSTECTOMY N/A 04/18/2015   Procedure: LAPAROSCOPIC CHOLECYSTECTOMY;  Surgeon: Ralene Ok, MD;  Location: [facility];  Service: General;  Laterality: N/A;  . HYSTEROSCOPY WITH D & C  12/24/2001   Archie Endo 09/28/2010  . I & D EXTREMITY Right 07/25/2016   Procedure: IRRIGATION AND DEBRIDEMENT RIGHT LEG ULCER, APPLY VERAFLO VAC;  Surgeon: [name], MD;  Location: [facility];  Service: Orthopedics;  Laterality: Right;  . I & D EXTREMITY Right 09/28/2018   Procedure: DEBRIDEMENT RIGHT LOWER LEG WITH PLACEMENT WITH PLACEMENT OF SKIN GRAFT  AND VAC;  Surgeon: [name], MD;  Location: [facility];  Service: Orthopedics;  Laterality: Right;  . I & D EXTREMITY Right 10/03/2018   Procedure: REPEAT IRRIGATION AND DEBRIDEMENT RIGHT LOWER LEG, VAC PLACEMENT;  Surgeon: [name], MD;  Location: [facility];  Service: Orthopedics;  Laterality: Right;  . INCISE AND DRAIN ABCESS Right 07/14/2016  . INCISION AND DRAINAGE Right 09/10/2008   leg:  skin and soft tissue and muscle/notes 09/15/2010  . INCISION AND DRAINAGE Right 01/01/2008   Chronic venous stasis insufficiency ulcer,/notes 09/14/2010/  . INCISION AND DRAINAGE Right 12/7562   calf w/application wound vac/notes 07/20/2010  . INCISION AND DRAINAGE Right 07/25/2016   IRRIGATION AND DEBRIDEMENT RIGHT LEG ULCER,  . LAPAROSCOPIC GASTRIC BYPASS  ~ 2007  . MULTIPLE TOOTH EXTRACTIONS    . SKIN GRAFT SPLIT THICKNESS LEG / FOOT Right 07/25/2016   LEG  . SKIN SPLIT GRAFT Right 07/27/2016   Procedure: SKIN GRAFT RIGHT LEG WITH THERASKIN APPLICATION;  Surgeon: [name], MD;  Location: [facility];  Service: Orthopedics;  Laterality: Right;  . TONSILLECTOMY     Social  History   Occupational History  . Not on file  Tobacco Use  . Smoking status: Never Smoker  . Smokeless tobacco: Never Used  Substance and Sexual Activity  . Alcohol use: No  . Drug use: No  . Sexual activity: Yes    Partners: Male    Birth control/protection: I.U.D.

## 2019-11-13 ENCOUNTER — Other Ambulatory Visit: Payer: Self-pay | Admitting: Cardiology

## 2019-11-13 DIAGNOSIS — Z23 Encounter for immunization: Secondary | ICD-10-CM | POA: Diagnosis not present

## 2019-11-13 NOTE — Telephone Encounter (Signed)
Pt was able to obtain an appt on 12/24/2019 with Alice [name]; therefore will send in refill to requested pharmacy.  Eliquis 2.5mg  refill request received. Patient is 49 years old, weight-136.1kg, Crea-1.88, Diagnosis-DVT, PE-long term VTE prevention (was on warfarin for years then switched to Eliquis 10/25/2018) confirmed with [facility] Pharmacist, and last seen by Dr.Nelson on 03/09/2017-appt with [name] on 12/24/2019. Dose is appropriate based on dosing criteria. Will send in refill to requested pharmacy.

## 2019-11-13 NOTE — Telephone Encounter (Signed)
Pt was last seen by Dr Meda Coffee 03/09/17, overdue for follow-up.  Last labs 08/19/19 Creat 1.88, age 49, weight 136.1kg, based on specified criteria pt is on appropriate dosage of Eliquis 5mg  BID.  Pt needs appt to refill rx, called pt and made her aware she is overdue for follow-up.  Will send message to scheduling to contact pt to schedule f/u appt with Dr Meda Coffee. Will refill rx to get pt to appt, once appt has been made.

## 2019-11-20 DIAGNOSIS — I10 Essential (primary) hypertension: Secondary | ICD-10-CM | POA: Diagnosis not present

## 2019-11-20 DIAGNOSIS — R7303 Prediabetes: Secondary | ICD-10-CM | POA: Diagnosis not present

## 2019-11-20 DIAGNOSIS — G4733 Obstructive sleep apnea (adult) (pediatric): Secondary | ICD-10-CM | POA: Diagnosis not present

## 2019-11-20 DIAGNOSIS — N1831 Chronic kidney disease, stage 3a: Secondary | ICD-10-CM | POA: Diagnosis not present

## 2019-11-20 DIAGNOSIS — E559 Vitamin D deficiency, unspecified: Secondary | ICD-10-CM | POA: Diagnosis not present

## 2019-11-20 DIAGNOSIS — R1013 Epigastric pain: Secondary | ICD-10-CM | POA: Diagnosis not present

## 2019-11-20 DIAGNOSIS — D508 Other iron deficiency anemias: Secondary | ICD-10-CM | POA: Diagnosis not present

## 2019-11-20 DIAGNOSIS — F329 Major depressive disorder, single episode, unspecified: Secondary | ICD-10-CM | POA: Diagnosis not present

## 2019-11-20 DIAGNOSIS — G629 Polyneuropathy, unspecified: Secondary | ICD-10-CM | POA: Diagnosis not present

## 2019-11-25 ENCOUNTER — Ambulatory Visit: Payer: Medicare Other | Admitting: Orthopedic Surgery

## 2019-11-27 ENCOUNTER — Other Ambulatory Visit: Payer: Self-pay

## 2019-11-27 ENCOUNTER — Ambulatory Visit (INDEPENDENT_AMBULATORY_CARE_PROVIDER_SITE_OTHER): Payer: Medicare Other | Admitting: Physician Assistant

## 2019-11-27 ENCOUNTER — Encounter: Payer: Self-pay | Admitting: Physician Assistant

## 2019-11-27 VITALS — BP 130/60 | HR 91 | Ht 68.0 in | Wt 308.6 lb

## 2019-11-27 DIAGNOSIS — I10 Essential (primary) hypertension: Secondary | ICD-10-CM | POA: Diagnosis not present

## 2019-11-27 DIAGNOSIS — I493 Ventricular premature depolarization: Secondary | ICD-10-CM

## 2019-11-27 DIAGNOSIS — N1832 Chronic kidney disease, stage 3b: Secondary | ICD-10-CM

## 2019-11-27 DIAGNOSIS — Z86711 Personal history of pulmonary embolism: Secondary | ICD-10-CM

## 2019-11-27 DIAGNOSIS — I428 Other cardiomyopathies: Secondary | ICD-10-CM

## 2019-11-27 DIAGNOSIS — E876 Hypokalemia: Secondary | ICD-10-CM

## 2019-11-27 MED ORDER — APIXABAN 2.5 MG PO TABS: 2.5000 mg | ORAL_TABLET | Freq: Two times a day (BID) | ORAL | 3 refills | Status: DC

## 2019-11-27 NOTE — Progress Notes (Signed)
Cardiology Office Note:    Date:  11/27/2019   ID:  Alice [name], DOB 04/10/1971, MRN [id number]  PCP:  Benito Mccreedy, MD  Cardiologist:  Ena Dawley, MD  Electrophysiologist:  None   Referring MD: Benito Mccreedy, MD   Chief Complaint:  Follow-up (Cardiomyopathy)    Patient Profile:    Alice [name] is a 49 y.o. female with:   DVT, prior pulmonary embolism   Warfarin Rx  Hypertension   Obesity   Anemia   Venous insufficiency   Hx of venous stasis ulcer  Chronic kidney disease   Dilated CM  Noted during admit for urosepsis in 2018  Echo 8/18: EF 45-50  Echo 2/19: EF 50-55; no ischemic work-up pursued due to improved LV function  Prior CV studies: Echocardiogram 06/27/2017 Mild focal basal septal hypertrophy, EF 50-55, normal wall motion, mild-moderate LAE, moderate RVE, moderate RAE  Echocardiogram 12/29/2016 EF 45-50, diffuse HK, mild LAE, moderate RAE, moderate RVE  History of Present Illness:    Alice [name] was last seen by Dr. Meda Coffee in October 2018.  She had been admitted during that year with urosepsis with associated acute kidney injury.  Her troponin levels were minimally elevated and flat without clear trend.  This was felt to be related to demand ischemia.  She had an EF of 45%.  It was felt that this may be stress-induced cardiomyopathy.  An outpatient ischemic evaluation was to be pursued.  However, follow-up echocardiogram in 2/19 demonstrated normal LV function.  Therefore, no further testing was indicated.   She returns for follow up.  She is here alone.  She has chronic lower extremity swelling with the right being greater than the left.  This is unchanged.  She has not had significant shortness of breath, chest pain, syncope, orthopnea.  Last year, she was changed from warfarin to Apixaban.  She did not realize that she was supposed to take this twice a day.  She knows to take it twice a day now.     Past Medical History:    Diagnosis Date  . Anemia   . Anxiety   . Arthritis   . Chronic bronchitis ([place])   . Chronic upper back pain   . Depression   . DVT (deep venous thrombosis) ([facility])    BLE  . GERD (gastroesophageal reflux disease)   . Headache    "weekly" (04/16/2015)  . Hypertension   . Migraine    "monthly" (04/16/2015)  . Morbid obesity with BMI of 50.0-59.9, adult ([place])   . Obesity   . Pneumonia 2014  . Pulmonary embolism ([place])   . Sinusitis nasal   . Sleep apnea    "I'm suppose to wear a mask but I don't" (04/16/2015)  . Varicose veins of right lower extremity   . Venous stasis of lower extremity    right  . Wears dentures   . Wears glasses     Current Medications: Current Meds  Medication Sig  . acetaminophen (TYLENOL) 325 MG tablet Take 1-2 tablets (325-650 mg total) by mouth every 4 (four) hours as needed for mild pain.  Marland Kitchen albuterol (PROVENTIL HFA;VENTOLIN HFA) 108 (90 Base) MCG/ACT inhaler Inhale 2 puffs into the lungs every 6 (six) hours as needed for wheezing or shortness of breath.   Marland Kitchen amLODipine (NORVASC) 10 MG tablet Take 1 tablet (10 mg total) by mouth daily.  Marland Kitchen apixaban (ELIQUIS) 2.5 MG TABS tablet Take 1 tablet (2.5 mg total) by mouth 2 (two) times daily.  Marland Kitchen  buPROPion (WELLBUTRIN XL) 300 MG 24 hr tablet Take 300 mg by mouth daily.  . ferrous sulfate 325 (65 FE) MG tablet Take 1 tablet (325 mg total) by mouth daily with breakfast.  . furosemide (LASIX) 20 MG tablet Take 20 mg by mouth 2 (two) times daily.   . hydrocerin (EUCERIN) CREA Apply 1 application topically 2 (two) times daily.  . hydrochlorothiazide (HYDRODIURIL) 25 MG tablet Take 25 mg by mouth daily.  . nitroGLYCERIN (NITROSTAT) 0.4 MG SL tablet Place 1 tablet (0.4 mg total) under the tongue every 5 (five) minutes x 3 doses as needed for chest pain.  . pantoprazole (PROTONIX) 40 MG tablet Take 1 tablet (40 mg total) by mouth daily. For acid reflux  . pregabalin (LYRICA) 200 MG capsule Take 200 mg by mouth 2 (two) times  a day.  . [DISCONTINUED] ELIQUIS 2.5 MG TABS tablet TAKE 1 TABLET(2.5 MG) BY MOUTH TWICE DAILY. FOLLOW-UP NEEDED FOR MORE REFILLS     Allergies:   Vicodin [hydrocodone-acetaminophen]   Social History   Tobacco Use  . Smoking status: Never Smoker  . Smokeless tobacco: Never Used  Vaping Use  . Vaping Use: Never used  Substance Use Topics  . Alcohol use: No  . Drug use: No     Family Hx: The patient's family history includes Kidney disease in her mother.  Review of Systems  Respiratory: Negative for hemoptysis.   Gastrointestinal: Negative for hematochezia and melena.  Genitourinary: Negative for hematuria.     EKGs/Labs/Other Test Reviewed:    EKG:  EKG is  ordered today.  The ekg ordered today demonstrates normal sinus rhythm, heart rate 91, normal axis, PVCs, nonspecific ST-T wave changes, QTC 469  Recent Labs: 05/12/2019: ALT 18 08/19/2019: BUN 28; Creatinine, Ser 1.88; Hemoglobin 12.3; Magnesium 1.9; Platelets 236; Potassium 3.1; Sodium 138   Recent Lipid Panel Lab Results  Component Value Date/Time   CHOL 153 09/14/2007 10:19 PM   TRIG 46 09/14/2007 10:19 PM   HDL 52 09/14/2007 10:19 PM   CHOLHDL 2.9 Ratio 09/14/2007 10:19 PM   LDLCALC 92 09/14/2007 10:19 PM    Physical Exam:    VS:  BP 130/60   Pulse 91   Ht 5\' 8"  (1.727 m)   Wt (!) 308 lb 9.6 oz (140 kg)   BMI 46.92 kg/m     Wt Readings from Last 3 Encounters:  11/27/19 (!) 308 lb 9.6 oz (140 kg)  09/10/19 300 lb (136.1 kg)  09/04/19 (!) 304 lb (137.9 kg)     Constitutional:      Appearance: Healthy appearance. Not in distress.  Neck:     Thyroid: No thyromegaly.     Vascular: JVD normal.  Pulmonary:     Effort: Pulmonary effort is normal.     Breath sounds: No wheezing. No rales.  Cardiovascular:     Normal rate. Regularly irregular rhythm. Normal S1. Normal S2.     Murmurs: There is no murmur.  Edema:    Pretibial: trace edema of the left pretibial area and 1+ edema of the right pretibial  area.    Ankle: trace edema of the left ankle and 1+ edema of the right ankle. Abdominal:     Palpations: Abdomen is soft. There is no hepatomegaly.  Skin:    General: Skin is warm and dry.  Neurological:     Mental Status: Alert and oriented to person, place and time.     Cranial Nerves: Cranial nerves are intact.  ASSESSMENT & PLAN:    1. NICM (nonischemic cardiomyopathy) ([place]) EF during hospitalization in 2018 for urosepsis and AKI was 45-50%.  Repeat echo in February 2019 demonstrated normal LV function.  She has not had any symptoms to suggest worsening LV function.  She has not had anginal symptoms.  She currently does not take beta-blocker or ACE inhibitor/ARB.  If she has a decrease in LV function over time, we will need to consider starting these medications.  2. Essential hypertension The patient's blood pressure is controlled on her current regimen.  Continue current therapy.   3. History of pulmonary embolism She was previously on warfarin.  This was changed to Apixaban last year and is managed by our clinic.  Refill Apixaban.  She understands that she needs to take it twice daily.  Recent hemoglobin normal.  Repeat BMET will be obtained today.  4. Stage 3b chronic kidney disease Most recent creatinine 1.88.  Obtain follow-up BMET today.  Continue follow-up with primary care.  5. PVC's (premature ventricular contractions) She has several PVCs on electrocardiogram today.  She had a recent potassium of 3.1 in April 2021.  Obtain follow-up edema today.  6. Hypokalemia As noted, her potassium in April 2021 was 3.1.  Repeat BMET today.    Dispo:  Return in about 1 year (around 11/26/2020) for Routine Follow Up w/ Dr Meda Coffee, in person.   Medication Adjustments/Labs and Tests Ordered: Current medicines are reviewed at length with the patient today.  Concerns regarding medicines are outlined above.  Tests Ordered: Orders Placed This Encounter  Procedures  . Basic  metabolic panel  . EKG 12-Lead   Medication Changes: Meds ordered this encounter  Medications  . apixaban (ELIQUIS) 2.5 MG TABS tablet    Sig: Take 1 tablet (2.5 mg total) by mouth 2 (two) times daily.    Dispense:  180 tablet    Refill:  3    Signed, Richardson Dopp, PA-C  11/27/2019 2:35 PM    [facility] [place], [place], [place]  [address] Phone: [phone]; Fax: [phone]

## 2019-11-27 NOTE — Patient Instructions (Signed)
Medication Instructions:  Your physician recommends that you continue on your current medications as directed. Please refer to the Current Medication list given to you today.  *If you need a refill on your cardiac medications before your next appointment, please call your pharmacy*  Lab Work: You will have labs drawn today: BMET  Testing/Procedures: None ordered today  Follow-Up: At [facility], you and your health needs are our priority.  As part of our continuing mission to provide you with exceptional heart care, we have created designated Provider Care Teams.  These Care Teams include your primary Cardiologist (physician) and Advanced Practice Providers (APPs -  Physician Assistants and Nurse Practitioners) who all work together to provide you with the care you need, when you need it.  We recommend signing up for the patient portal called "MyChart".  Sign up information is provided on this After Visit Summary.  MyChart is used to connect with patients for Virtual Visits (Telemedicine).  Patients are able to view lab/test results, encounter notes, upcoming appointments, etc.  Non-urgent messages can be sent to your provider as well.   To learn more about what you can do with MyChart, go to [email or web address].    Your next appointment:   12 month(s)  The format for your next appointment:   In Person  Provider:   Ena Dawley, MD

## 2019-11-28 ENCOUNTER — Telehealth: Payer: Self-pay | Admitting: *Deleted

## 2019-11-28 DIAGNOSIS — I10 Essential (primary) hypertension: Secondary | ICD-10-CM

## 2019-11-28 DIAGNOSIS — I428 Other cardiomyopathies: Secondary | ICD-10-CM

## 2019-11-28 DIAGNOSIS — N1832 Chronic kidney disease, stage 3b: Secondary | ICD-10-CM

## 2019-11-28 LAB — BASIC METABOLIC PANEL
BUN/Creatinine Ratio: 15 (ref 9–23)
BUN: 30 mg/dL — ABNORMAL HIGH (ref 6–24)
CO2: 24 mmol/L (ref 20–29)
Calcium: 8.7 mg/dL (ref 8.7–10.2)
Chloride: 101 mmol/L (ref 96–106)
Creatinine, Ser: 2.04 mg/dL — ABNORMAL HIGH (ref 0.57–1.00)
GFR calc Af Amer: 33 mL/min/{1.73_m2} — ABNORMAL LOW (ref >59)
GFR calc non Af Amer: 28 mL/min/{1.73_m2} — ABNORMAL LOW (ref >59)
Glucose: 89 mg/dL (ref 65–99)
Potassium: 3.5 mmol/L (ref 3.5–5.2)
Sodium: 140 mmol/L (ref 134–144)

## 2019-11-28 MED ORDER — POTASSIUM CHLORIDE CRYS ER 20 MEQ PO TBCR: 20.0000 meq | EXTENDED_RELEASE_TABLET | Freq: Every day | ORAL | 3 refills | Status: DC

## 2019-11-28 NOTE — Telephone Encounter (Signed)
Patient notified. Will send prescription for potassium to [facility] on [address].   Patient will come in for lab work on July 22,2021. She has access to a BP cuff and will contact us with readings in 2 weeks.

## 2019-11-28 NOTE — Telephone Encounter (Signed)
-----   Message from Liliane Shi, PA-C sent at 11/28/2019 10:43 AM EDT ----- Creatinine worse.  K+ borderline low.   PLAN:   - DC HCTZ  - Start K+ 20 mEq once daily   - BMET 1 week  - Monitor BP over next 2 weeks (if possible) and send readings  - If no BP cuff, arrange follow up with APP on Dr. Francesca Oman team or me for BP Richardson Dopp, PA-C    11/28/2019 10:40 AM

## 2019-12-02 ENCOUNTER — Ambulatory Visit: Payer: Medicare Other | Admitting: Nurse Practitioner

## 2019-12-02 ENCOUNTER — Encounter: Payer: Self-pay | Admitting: Nurse Practitioner

## 2019-12-05 ENCOUNTER — Other Ambulatory Visit: Payer: Medicare Other

## 2019-12-09 ENCOUNTER — Other Ambulatory Visit: Payer: Medicare Other | Admitting: *Deleted

## 2019-12-09 ENCOUNTER — Other Ambulatory Visit: Payer: Self-pay

## 2019-12-09 DIAGNOSIS — I428 Other cardiomyopathies: Secondary | ICD-10-CM | POA: Diagnosis not present

## 2019-12-09 DIAGNOSIS — I10 Essential (primary) hypertension: Secondary | ICD-10-CM | POA: Diagnosis not present

## 2019-12-09 DIAGNOSIS — N1832 Chronic kidney disease, stage 3b: Secondary | ICD-10-CM

## 2019-12-09 LAB — BASIC METABOLIC PANEL
BUN/Creatinine Ratio: 10 (ref 9–23)
BUN: 20 mg/dL (ref 6–24)
CO2: 20 mmol/L (ref 20–29)
Calcium: 8.8 mg/dL (ref 8.7–10.2)
Chloride: 105 mmol/L (ref 96–106)
Creatinine, Ser: 1.91 mg/dL — ABNORMAL HIGH (ref 0.57–1.00)
GFR calc Af Amer: 35 mL/min/{1.73_m2} — ABNORMAL LOW (ref >59)
GFR calc non Af Amer: 31 mL/min/{1.73_m2} — ABNORMAL LOW (ref >59)
Glucose: 57 mg/dL — ABNORMAL LOW (ref 65–99)
Potassium: 4.8 mmol/L (ref 3.5–5.2)
Sodium: 139 mmol/L (ref 134–144)

## 2019-12-10 ENCOUNTER — Telehealth: Payer: Self-pay

## 2019-12-10 DIAGNOSIS — N1832 Chronic kidney disease, stage 3b: Secondary | ICD-10-CM

## 2019-12-10 NOTE — Telephone Encounter (Signed)
Salt restriction is imperative.  Can increase her Lasix to 40 mg in the AM and 20 mg in the PM for 3 days - then back to usual dose.   BMET in one week - order under [place] please.   Needs to send in BP readings as he had requested.   Cecille Rubin

## 2019-12-10 NOTE — Telephone Encounter (Signed)
-----   Message from Liliane Shi, [place] sent at 12/09/2019  4:44 PM EDT ----- Creatinine improved.  K+ normal. Glucose low.  PLAN:   - Continue current medications/treatment plan and follow up as scheduled. Richardson Dopp, PA-C    12/09/2019 4:41 PM

## 2019-12-10 NOTE — Telephone Encounter (Signed)
I called and spoke with patient, she is aware of lab results. She states HCTZ was stopped and that she has started swelling again in lowers legs and feet, patient has also gained 6 pounds since the last office visit. Patient is on Furosemide 20 mg, twice a day.

## 2019-12-11 DIAGNOSIS — Z23 Encounter for immunization: Secondary | ICD-10-CM | POA: Diagnosis not present

## 2019-12-12 NOTE — Telephone Encounter (Signed)
I called and spoke with patient, she states that she does not want to increase Furosemide. She states that she took HCTZ 25 mg and the swelling is now gone. She would like to go back on HCTZ since this helped swelling and she feels better. She is agreeable to coming back next week for blood work. Patient would like to wait on Scott to make this decision about HCTZ, patient is aware Nicki Reaper will not be back in the office until Monday.

## 2019-12-14 NOTE — Telephone Encounter (Signed)
Restart HCTZ 25 mg once daily  Reduce Furosemide to 20 mg once daily  BMET 1 week Richardson Dopp, PA-C    12/14/2019 10:27 PM

## 2019-12-16 MED ORDER — HYDROCHLOROTHIAZIDE 25 MG PO TABS: 25.0000 mg | ORAL_TABLET | Freq: Every day | ORAL | 3 refills | Status: DC

## 2019-12-16 MED ORDER — FUROSEMIDE 20 MG PO TABS: 20.0000 mg | ORAL_TABLET | Freq: Every day | ORAL | 1 refills | Status: DC

## 2019-12-16 NOTE — Telephone Encounter (Signed)
I called and spoke with patient, she is aware to restart HCTZ 25 mg, 1 tablet by mouth once a day and to reduce Furosemide to 20 mg, 1 tablet by mouth once a day. Patient verbalized understanding and thanked me for the call, she will come back on 12/23/19 for repeat BMET.

## 2019-12-16 NOTE — Addendum Note (Signed)
Addended by: Mady Haagensen on: 12/16/2019 12:45 PM   Modules accepted: Orders

## 2019-12-23 ENCOUNTER — Other Ambulatory Visit: Payer: Medicare Other

## 2019-12-24 ENCOUNTER — Ambulatory Visit: Payer: Medicare Other | Admitting: Physician Assistant

## 2020-01-07 ENCOUNTER — Ambulatory Visit: Payer: Medicare Other | Admitting: Obstetrics and Gynecology

## 2020-02-14 ENCOUNTER — Other Ambulatory Visit: Payer: Self-pay | Admitting: Cardiology

## 2020-02-14 NOTE — Telephone Encounter (Signed)
Prescription refill request for Eliquis received.  Last office visit: Alice [name] 11/27/2019 Scr: 1.91, 12/09/2019 Age: 49 y.o. Weight: 140 kg    Long term VTE prevention. Prescription refill sent.

## 2020-02-19 DIAGNOSIS — F329 Major depressive disorder, single episode, unspecified: Secondary | ICD-10-CM | POA: Diagnosis not present

## 2020-02-19 DIAGNOSIS — N1831 Chronic kidney disease, stage 3a: Secondary | ICD-10-CM | POA: Diagnosis not present

## 2020-02-19 DIAGNOSIS — E559 Vitamin D deficiency, unspecified: Secondary | ICD-10-CM | POA: Diagnosis not present

## 2020-02-19 DIAGNOSIS — G4733 Obstructive sleep apnea (adult) (pediatric): Secondary | ICD-10-CM | POA: Diagnosis not present

## 2020-02-19 DIAGNOSIS — R7303 Prediabetes: Secondary | ICD-10-CM | POA: Diagnosis not present

## 2020-02-19 DIAGNOSIS — D508 Other iron deficiency anemias: Secondary | ICD-10-CM | POA: Diagnosis not present

## 2020-02-19 DIAGNOSIS — R1013 Epigastric pain: Secondary | ICD-10-CM | POA: Diagnosis not present

## 2020-02-19 DIAGNOSIS — I10 Essential (primary) hypertension: Secondary | ICD-10-CM | POA: Diagnosis not present

## 2020-02-19 DIAGNOSIS — G629 Polyneuropathy, unspecified: Secondary | ICD-10-CM | POA: Diagnosis not present

## 2020-04-20 ENCOUNTER — Other Ambulatory Visit: Payer: Self-pay | Admitting: Internal Medicine

## 2020-04-20 DIAGNOSIS — Z1231 Encounter for screening mammogram for malignant neoplasm of breast: Secondary | ICD-10-CM

## 2020-04-22 DIAGNOSIS — R1013 Epigastric pain: Secondary | ICD-10-CM | POA: Diagnosis not present

## 2020-04-22 DIAGNOSIS — N1831 Chronic kidney disease, stage 3a: Secondary | ICD-10-CM | POA: Diagnosis not present

## 2020-04-22 DIAGNOSIS — E559 Vitamin D deficiency, unspecified: Secondary | ICD-10-CM | POA: Diagnosis not present

## 2020-04-22 DIAGNOSIS — F329 Major depressive disorder, single episode, unspecified: Secondary | ICD-10-CM | POA: Diagnosis not present

## 2020-04-22 DIAGNOSIS — R7303 Prediabetes: Secondary | ICD-10-CM | POA: Diagnosis not present

## 2020-04-22 DIAGNOSIS — I10 Essential (primary) hypertension: Secondary | ICD-10-CM | POA: Diagnosis not present

## 2020-04-22 DIAGNOSIS — G629 Polyneuropathy, unspecified: Secondary | ICD-10-CM | POA: Diagnosis not present

## 2020-04-22 DIAGNOSIS — D508 Other iron deficiency anemias: Secondary | ICD-10-CM | POA: Diagnosis not present

## 2020-04-22 DIAGNOSIS — G4733 Obstructive sleep apnea (adult) (pediatric): Secondary | ICD-10-CM | POA: Diagnosis not present

## 2020-04-22 DIAGNOSIS — Z23 Encounter for immunization: Secondary | ICD-10-CM | POA: Diagnosis not present

## 2020-05-25 DIAGNOSIS — Z1152 Encounter for screening for COVID-19: Secondary | ICD-10-CM | POA: Diagnosis not present

## 2020-05-28 DIAGNOSIS — R059 Cough, unspecified: Secondary | ICD-10-CM | POA: Diagnosis not present

## 2020-05-28 DIAGNOSIS — Z20822 Contact with and (suspected) exposure to covid-19: Secondary | ICD-10-CM | POA: Diagnosis not present

## 2020-06-02 ENCOUNTER — Ambulatory Visit: Payer: Medicare Other

## 2020-06-22 ENCOUNTER — Ambulatory Visit: Payer: Medicare Other

## 2020-07-13 ENCOUNTER — Encounter: Payer: Self-pay | Admitting: Orthopedic Surgery

## 2020-07-13 ENCOUNTER — Ambulatory Visit (INDEPENDENT_AMBULATORY_CARE_PROVIDER_SITE_OTHER): Payer: Medicare Other | Admitting: Orthopedic Surgery

## 2020-07-13 DIAGNOSIS — L97919 Non-pressure chronic ulcer of unspecified part of right lower leg with unspecified severity: Secondary | ICD-10-CM | POA: Diagnosis not present

## 2020-07-13 DIAGNOSIS — I87331 Chronic venous hypertension (idiopathic) with ulcer and inflammation of right lower extremity: Secondary | ICD-10-CM | POA: Diagnosis not present

## 2020-07-13 NOTE — Progress Notes (Signed)
Office Visit Note   Patient: Alice [name]           Date of Birth: November 18, 1970           MRN: [id number] Visit Date: 07/13/2020              Requested by: Benito Mccreedy, MD [place] [id number] [place],  [place] [id number] PCP: Benito Mccreedy, MD  Chief Complaint  Patient presents with  . Right Leg - Open Wound      HPI: Patient is a 50 year old woman who presents with a recurrent ulcer of the right lower extremity.  Patient states she has not been using any compression or dressing.  Assessment & Plan: Visit Diagnoses:  1. 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  Follow-Up Instructions: Return in about 2 weeks (around 07/27/2020).   Ortho Exam  Patient is alert, oriented, no adenopathy, well-dressed, normal affect, normal respiratory effort. Examination patient has epithelialization around the ulcer without melanin pigmentation.  The ulcer is 3 x 5 cm which is larger than it was a year ago.  There is no cellulitis no tenderness to palpation around the wound.  The wound is 2 mm deep with a very thin layer of fibrinous exudative tissue.  No exposed bone or tendon.  Imaging: No results found. No images are attached to the encounter.  Labs: Lab Results  Component Value Date   REPTSTATUS 10/06/2018 FINAL 10/05/2018   GRAMSTAIN  09/28/2018    FEW WBC PRESENT, PREDOMINANTLY PMN FEW GRAM POSITIVE COCCI IN PAIRS FEW GRAM POSITIVE RODS    CULT 20,000 COLONIES/mL YEAST (A) 10/05/2018   LABORGA PROTEUS MIRABILIS 09/28/2018   LABORGA STAPHYLOCOCCUS CAPRAE 09/28/2018   LABORGA SERRATIA MARCESCENS 09/28/2018     Lab Results  Component Value Date   ALBUMIN 3.4 (L) 05/12/2019   ALBUMIN 2.5 (L) 10/08/2018   ALBUMIN 3.2 (L) 01/29/2018    Lab  Results  Component Value Date   MG 1.9 08/19/2019   MG 1.8 12/29/2016   MG 1.9 12/28/2016   No results found for: VD25OH  No results found for: PREALBUMIN CBC EXTENDED Latest Ref Rng & Units 08/19/2019 05/12/2019 10/13/2018  WBC 4.0 - 10.5 K/uL 7.5 5.3 8.4  RBC 3.87 - 5.11 MIL/uL 4.37 5.20(H) 3.92  HGB 12.0 - 15.0 g/dL 12.3 14.4 9.9(L)  HCT 36.0 - 46.0 % 36.7 43.1 30.3(L)  PLT 150 - 400 K/uL 236 240 389  NEUTROABS 1.7 - 7.7 K/uL 5.2 - -  LYMPHSABS 0.7 - 4.0 K/uL 1.5 - -     There is no height or weight on file to calculate BMI.  Orders:  No orders of the defined types were placed in this encounter.  No orders of the defined types were placed in this encounter.    Procedures: No procedures performed  Clinical Data: No additional findings.  ROS:  All other systems negative, except as noted in the HPI. Review of Systems  Objective: Vital Signs: There were no vitals taken for this visit.  Specialty Comments:  No specialty comments available.  PMFS History: Patient Active Problem List   Diagnosis Date Noted  . Subtherapeutic international normalized ratio (INR)   . Postoperative pain   . Hypoalbuminemia   . Acute on chronic kidney failure (  [place])   . Acute blood loss anemia   . Benign essential HTN   . Debility 10/05/2018  . Chronic ulcer of right leg, with fat layer exposed ([place]) 09/28/2018  . Chronic ulcer of leg, right, with necrosis of muscle ([place])   . Iron deficiency anemia 01/29/2018  . Pulmonary embolus ([place]) 01/09/2018  . Encounter for therapeutic drug monitoring 01/09/2018  . Severe malnutrition ([place]) 08/24/2017  . Acute kidney injury superimposed on CKD ([place]) 12/28/2016  . Hx of skin graft 08/01/2016  . 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 ([place]) 07/25/2016  . 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 Trichomonal infection 11/24/2015  . Abdominal pain 05/02/2015   . Symptomatic cholelithiasis 04/16/2015  . Acute bilateral upper abdominal pain 04/16/2015  . Microcytic anemia 05/18/2013  . Hypotension, unspecified 05/17/2013  . CKD (chronic kidney disease), stage III ([place]) 05/17/2013  . Hypokalemia 05/17/2013  . Anal fissure 02/06/2013  . Anal skin tag 02/06/2013  . ALLERGIC RHINITIS 03/05/2010  . LOW BACK PAIN SYNDROME 12/13/2007  . OBESITY, MORBID 12/02/2006  . HTN (hypertension) 12/02/2006  . History of pulmonary embolism 12/02/2006  . History of DVT (deep vein thrombosis) 12/02/2006  . 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 GASTROESOPHAGEAL REFLUX DISEASE 12/02/2006  . OSA (obstructive sleep apnea) 12/02/2006   Past Medical History:  Diagnosis Date  . Anemia   . Anxiety   . Arthritis   . Chronic bronchitis ([place])   . Chronic upper back pain   . Depression   . DVT (deep venous thrombosis) (HCC)    BLE  . GERD (gastroesophageal reflux disease)   . Headache    "weekly" (04/16/2015)  . Hypertension   . Migraine    "monthly" (04/16/2015)  . Morbid obesity with BMI of 50.0-59.9, adult ([place])   . Obesity   . Pneumonia 2014  . Pulmonary embolism ([place])   . Sinusitis nasal   . Sleep apnea    "I'm suppose to wear a mask but I don't" (04/16/2015)  . Varicose veins of right lower extremity   . Venous stasis of lower extremity    right  . Wears dentures   . Wears glasses     Family History  Problem Relation Age of Onset  . Kidney disease Mother        kidney transplant    Past Surgical History:  Procedure Laterality Date  . CHOLECYSTECTOMY N/A 04/18/2015   Procedure: LAPAROSCOPIC CHOLECYSTECTOMY;  Surgeon: Ralene Ok, MD;  Location: [facility];  Service: General;  Laterality: N/A;  . HYSTEROSCOPY WITH D & C  12/24/2001   Archie Endo 09/28/2010  . I & D EXTREMITY Right 07/25/2016   Procedure: IRRIGATION AND DEBRIDEMENT RIGHT LEG ULCER, APPLY VERAFLO VAC;  Surgeon: [name], MD;  Location: [facility];  Service: Orthopedics;   Laterality: Right;  . I & D EXTREMITY Right 09/28/2018   Procedure: DEBRIDEMENT RIGHT LOWER LEG WITH PLACEMENT WITH PLACEMENT OF SKIN GRAFT AND VAC;  Surgeon: [name], MD;  Location: [facility];  Service: Orthopedics;  Laterality: Right;  . I & D EXTREMITY Right 10/03/2018   Procedure: REPEAT IRRIGATION AND DEBRIDEMENT RIGHT LOWER LEG, VAC PLACEMENT;  Surgeon: [name], MD;  Location: [facility];  Service: Orthopedics;  Laterality: Right;  . INCISE AND DRAIN ABCESS Right 07/14/2016  . INCISION AND DRAINAGE Right 09/10/2008   leg:  skin and soft tissue and muscle/notes 09/15/2010  .  INCISION AND DRAINAGE Right 01/01/2008   Chronic venous stasis insufficiency ulcer,/notes 09/14/2010/  . INCISION AND DRAINAGE Right [id number]   calf w/application wound vac/notes 07/20/2010  . INCISION AND DRAINAGE Right 07/25/2016   IRRIGATION AND DEBRIDEMENT RIGHT LEG ULCER,  . LAPAROSCOPIC GASTRIC BYPASS  ~ 2007  . MULTIPLE TOOTH EXTRACTIONS    . SKIN GRAFT SPLIT THICKNESS LEG / FOOT Right 07/25/2016   LEG  . SKIN SPLIT GRAFT Right 07/27/2016   Procedure: SKIN GRAFT RIGHT LEG WITH THERASKIN APPLICATION;  Surgeon: [name], MD;  Location: [facility];  Service: Orthopedics;  Laterality: Right;  . TONSILLECTOMY     Social History   Occupational History  . Not on file  Tobacco Use  . Smoking status: Never Smoker  . Smokeless tobacco: Never Used  Vaping Use  . Vaping Use: Never used  Substance and Sexual Activity  . Alcohol use: No  . Drug use: No  . Sexual activity: Yes    Partners: Male    Birth control/protection: I.U.D.

## 2020-07-14 ENCOUNTER — Ambulatory Visit
Admission: RE | Admit: 2020-07-14 | Discharge: 2020-07-14 | Disposition: A | Discharge status: Home / Self Care | Payer: Medicare Other | Source: Ambulatory Visit | Attending: Internal Medicine | Admitting: Internal Medicine

## 2020-07-14 ENCOUNTER — Other Ambulatory Visit: Payer: Self-pay

## 2020-07-14 DIAGNOSIS — Z1231 Encounter for screening mammogram for malignant neoplasm of breast: Secondary | ICD-10-CM | POA: Diagnosis not present

## 2020-07-16 DIAGNOSIS — R7303 Prediabetes: Secondary | ICD-10-CM | POA: Diagnosis not present

## 2020-07-16 DIAGNOSIS — D508 Other iron deficiency anemias: Secondary | ICD-10-CM | POA: Diagnosis not present

## 2020-07-16 DIAGNOSIS — N1831 Chronic kidney disease, stage 3a: Secondary | ICD-10-CM | POA: Diagnosis not present

## 2020-07-16 DIAGNOSIS — G629 Polyneuropathy, unspecified: Secondary | ICD-10-CM | POA: Diagnosis not present

## 2020-07-16 DIAGNOSIS — E119 Type 2 diabetes mellitus without complications: Secondary | ICD-10-CM | POA: Diagnosis not present

## 2020-07-16 DIAGNOSIS — E559 Vitamin D deficiency, unspecified: Secondary | ICD-10-CM | POA: Diagnosis not present

## 2020-07-16 DIAGNOSIS — G4733 Obstructive sleep apnea (adult) (pediatric): Secondary | ICD-10-CM | POA: Diagnosis not present

## 2020-07-16 DIAGNOSIS — F329 Major depressive disorder, single episode, unspecified: Secondary | ICD-10-CM | POA: Diagnosis not present

## 2020-07-16 DIAGNOSIS — R1013 Epigastric pain: Secondary | ICD-10-CM | POA: Diagnosis not present

## 2020-07-16 DIAGNOSIS — I10 Essential (primary) hypertension: Secondary | ICD-10-CM | POA: Diagnosis not present

## 2020-07-27 ENCOUNTER — Ambulatory Visit: Payer: Medicare Other | Admitting: Orthopedic Surgery

## 2020-07-31 ENCOUNTER — Other Ambulatory Visit: Payer: Self-pay

## 2020-07-31 ENCOUNTER — Ambulatory Visit (INDEPENDENT_AMBULATORY_CARE_PROVIDER_SITE_OTHER): Payer: Medicare Other | Admitting: Family

## 2020-07-31 DIAGNOSIS — I87331 Chronic venous hypertension (idiopathic) with ulcer and inflammation of right lower extremity: Secondary | ICD-10-CM | POA: Diagnosis not present

## 2020-07-31 DIAGNOSIS — L97919 Non-pressure chronic ulcer of unspecified part of right lower leg with unspecified severity: Secondary | ICD-10-CM | POA: Diagnosis not present

## 2020-08-04 ENCOUNTER — Encounter: Payer: Self-pay | Admitting: Family

## 2020-08-04 NOTE — Progress Notes (Signed)
Office Visit Note   Patient: Alice [name]           Date of Birth: [birth year]           MRN: [id number] Visit Date: 07/31/2020              Requested by: Benito Mccreedy, MD [address] [id number] [place],  [place] [address] PCP: Benito Mccreedy, MD  Chief Complaint  Patient presents with  . Right Leg - Follow-up      HPI: Patient is a 50 year old woman who presents with a recurrent ulcer of the right lower extremity. Has been using the medical compression stocking with direct skin contact.  Assessment & Plan: Visit Diagnoses:  No diagnosis found.  Plan: Patient is to continue wearing her compression stockings around-the-clock. And will wash the wound with soap and water daily. Call for any changes in wound.   Follow-Up Instructions: Return in about 2 weeks (around 08/14/2020).   Ortho Exam  Patient is alert, oriented, no adenopathy, well-dressed, normal affect, normal respiratory effort. Examination patient has epithelialization around the ulcer without melanin pigmentation.  The ulcer is unchanged, 3 x 5 cm. There is no cellulitis no tenderness to palpation around the wound.  The wound is 2 mm deep with near full granulation. No exposed bone or tendon.  Imaging: No results found. No images are attached to the encounter.  Labs: Lab Results  Component Value Date   REPTSTATUS 10/06/2018 FINAL 10/05/2018   GRAMSTAIN  09/28/2018    FEW WBC PRESENT, PREDOMINANTLY PMN FEW GRAM POSITIVE COCCI IN PAIRS FEW GRAM POSITIVE RODS    CULT 20,000 COLONIES/mL YEAST (A) 10/05/2018   LABORGA PROTEUS MIRABILIS 09/28/2018   LABORGA STAPHYLOCOCCUS CAPRAE 09/28/2018   LABORGA SERRATIA MARCESCENS 09/28/2018     Lab Results  Component Value Date   ALBUMIN 3.4 (L) 05/12/2019   ALBUMIN 2.5 (L) 10/08/2018   ALBUMIN 3.2 (L) 01/29/2018    Lab Results  Component Value Date   MG 1.9 08/19/2019   MG 1.8 12/29/2016   MG 1.9 12/28/2016   No results found for:  VD25OH  No results found for: PREALBUMIN CBC EXTENDED Latest Ref Rng & Units 08/19/2019 05/12/2019 10/13/2018  WBC 4.0 - 10.5 K/uL 7.5 5.3 8.4  RBC 3.87 - 5.11 MIL/uL 4.37 5.20(H) 3.92  HGB 12.0 - 15.0 g/dL 12.3 14.4 9.9(L)  HCT 36.0 - 46.0 % 36.7 43.1 30.3(L)  PLT 150 - 400 K/uL 236 240 389  NEUTROABS 1.7 - 7.7 K/uL 5.2 - -  LYMPHSABS 0.7 - 4.0 K/uL 1.5 - -     There is no height or weight on file to calculate BMI.  Orders:  No orders of the defined types were placed in this encounter.  No orders of the defined types were placed in this encounter.    Procedures: No procedures performed  Clinical Data: No additional findings.  ROS:  All other systems negative, except as noted in the HPI. Review of Systems  Constitutional: Negative for chills and fever.  Skin: Positive for wound. Negative for color change.    Objective: Vital Signs: There were no vitals taken for this visit.  Specialty Comments:  No specialty comments available.  PMFS History: Patient Active Problem List   Diagnosis Date Noted  . Subtherapeutic international normalized ratio (INR)   . Postoperative pain   . Hypoalbuminemia   . Acute on chronic kidney failure ([place])   . Acute blood loss anemia   . Benign essential  HTN   . Debility 10/05/2018  . Chronic ulcer of right leg, with fat layer exposed ([place]) 09/28/2018  . Chronic ulcer of leg, right, with necrosis of muscle ([place])   . Iron deficiency anemia 01/29/2018  . Pulmonary embolus ([place]) 01/09/2018  . Encounter for therapeutic drug monitoring 01/09/2018  . Severe malnutrition ([place]) 08/24/2017  . Acute kidney injury superimposed on CKD ([place]) 12/28/2016  . Hx of skin graft 08/01/2016  . 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 ([place]) 07/25/2016  . 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 Trichomonal infection 11/24/2015  . Abdominal pain 05/02/2015  .  Symptomatic cholelithiasis 04/16/2015  . Acute bilateral upper abdominal pain 04/16/2015  . Microcytic anemia 05/18/2013  . Hypotension, unspecified 05/17/2013  . CKD (chronic kidney disease), stage III ([place]) 05/17/2013  . Hypokalemia 05/17/2013  . Anal fissure 02/06/2013  . Anal skin tag 02/06/2013  . ALLERGIC RHINITIS 03/05/2010  . LOW BACK PAIN SYNDROME 12/13/2007  . OBESITY, MORBID 12/02/2006  . HTN (hypertension) 12/02/2006  . History of pulmonary embolism 12/02/2006  . History of DVT (deep vein thrombosis) 12/02/2006  . 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 GASTROESOPHAGEAL REFLUX DISEASE 12/02/2006  . OSA (obstructive sleep apnea) 12/02/2006   Past Medical History:  Diagnosis Date  . Anemia   . Anxiety   . Arthritis   . Chronic bronchitis ([place])   . Chronic upper back pain   . Depression   . DVT (deep venous thrombosis) (HCC)    BLE  . GERD (gastroesophageal reflux disease)   . Headache    "weekly" (04/16/2015)  . Hypertension   . Migraine    "monthly" (04/16/2015)  . Morbid obesity with BMI of 50.0-59.9, adult ([place])   . Obesity   . Pneumonia 2014  . Pulmonary embolism ([place])   . Sinusitis nasal   . Sleep apnea    "I'm suppose to wear a mask but I don't" (04/16/2015)  . Varicose veins of right lower extremity   . Venous stasis of lower extremity    right  . Wears dentures   . Wears glasses     Family History  Problem Relation Age of Onset  . Kidney disease Mother        kidney transplant    Past Surgical History:  Procedure Laterality Date  . CHOLECYSTECTOMY N/A 04/18/2015   Procedure: LAPAROSCOPIC CHOLECYSTECTOMY;  Surgeon: Ralene Ok, MD;  Location: [facility];  Service: General;  Laterality: N/A;  . HYSTEROSCOPY WITH D & C  12/24/2001   Archie Endo 09/28/2010  . I & D EXTREMITY Right 07/25/2016   Procedure: IRRIGATION AND DEBRIDEMENT RIGHT LEG ULCER, APPLY VERAFLO VAC;  Surgeon: [name], MD;  Location: [facility];  Service: Orthopedics;   Laterality: Right;  . I & D EXTREMITY Right 09/28/2018   Procedure: DEBRIDEMENT RIGHT LOWER LEG WITH PLACEMENT WITH PLACEMENT OF SKIN GRAFT AND VAC;  Surgeon: [name], MD;  Location: [facility];  Service: Orthopedics;  Laterality: Right;  . I & D EXTREMITY Right 10/03/2018   Procedure: REPEAT IRRIGATION AND DEBRIDEMENT RIGHT LOWER LEG, VAC PLACEMENT;  Surgeon: [name], MD;  Location: [facility];  Service: Orthopedics;  Laterality: Right;  . INCISE AND DRAIN ABCESS Right 07/14/2016  . INCISION AND DRAINAGE Right 09/10/2008   leg:  skin and soft tissue and muscle/notes 09/15/2010  . INCISION AND DRAINAGE Right 01/01/2008   Chronic venous stasis insufficiency ulcer,/notes  09/14/2010/  . INCISION AND DRAINAGE Right [id number]   calf w/application wound vac/notes 07/20/2010  . INCISION AND DRAINAGE Right 07/25/2016   IRRIGATION AND DEBRIDEMENT RIGHT LEG ULCER,  . LAPAROSCOPIC GASTRIC BYPASS  ~ 2007  . MULTIPLE TOOTH EXTRACTIONS    . SKIN GRAFT SPLIT THICKNESS LEG / FOOT Right 07/25/2016   LEG  . SKIN SPLIT GRAFT Right 07/27/2016   Procedure: SKIN GRAFT RIGHT LEG WITH THERASKIN APPLICATION;  Surgeon: [name], MD;  Location: [facility];  Service: Orthopedics;  Laterality: Right;  . TONSILLECTOMY     Social History   Occupational History  . Not on file  Tobacco Use  . Smoking status: Never Smoker  . Smokeless tobacco: Never Used  Vaping Use  . Vaping Use: Never used  Substance and Sexual Activity  . Alcohol use: No  . Drug use: No  . Sexual activity: Yes    Partners: Male    Birth control/protection: I.U.D.

## 2020-08-14 ENCOUNTER — Ambulatory Visit: Payer: Medicare Other | Admitting: Family

## 2020-08-14 ENCOUNTER — Ambulatory Visit: Payer: Medicare Other | Admitting: Physician Assistant

## 2020-08-20 DIAGNOSIS — G629 Polyneuropathy, unspecified: Secondary | ICD-10-CM | POA: Diagnosis not present

## 2020-08-20 DIAGNOSIS — I10 Essential (primary) hypertension: Secondary | ICD-10-CM | POA: Diagnosis not present

## 2020-08-20 DIAGNOSIS — D508 Other iron deficiency anemias: Secondary | ICD-10-CM | POA: Diagnosis not present

## 2020-08-20 DIAGNOSIS — N1831 Chronic kidney disease, stage 3a: Secondary | ICD-10-CM | POA: Diagnosis not present

## 2020-08-20 DIAGNOSIS — G4733 Obstructive sleep apnea (adult) (pediatric): Secondary | ICD-10-CM | POA: Diagnosis not present

## 2020-08-20 DIAGNOSIS — R7303 Prediabetes: Secondary | ICD-10-CM | POA: Diagnosis not present

## 2020-08-20 DIAGNOSIS — F329 Major depressive disorder, single episode, unspecified: Secondary | ICD-10-CM | POA: Diagnosis not present

## 2020-08-20 DIAGNOSIS — E559 Vitamin D deficiency, unspecified: Secondary | ICD-10-CM | POA: Diagnosis not present

## 2020-08-20 DIAGNOSIS — R1013 Epigastric pain: Secondary | ICD-10-CM | POA: Diagnosis not present

## 2020-08-20 DIAGNOSIS — R52 Pain, unspecified: Secondary | ICD-10-CM | POA: Diagnosis not present

## 2020-08-24 ENCOUNTER — Other Ambulatory Visit: Payer: Self-pay | Admitting: Cardiology

## 2020-08-24 ENCOUNTER — Ambulatory Visit (INDEPENDENT_AMBULATORY_CARE_PROVIDER_SITE_OTHER): Payer: Medicare Other | Admitting: Orthopedic Surgery

## 2020-08-24 DIAGNOSIS — L97919 Non-pressure chronic ulcer of unspecified part of right lower leg with unspecified severity: Secondary | ICD-10-CM | POA: Diagnosis not present

## 2020-08-24 DIAGNOSIS — I87331 Chronic venous hypertension (idiopathic) with ulcer and inflammation of right lower extremity: Secondary | ICD-10-CM

## 2020-08-24 NOTE — Telephone Encounter (Signed)
Pt's age 50, wt 140 kg, SCr 1.91, CrCl 79.61, last ov w/ SW 11/27/19. On low dose for long term VTE prevention.

## 2020-08-25 ENCOUNTER — Encounter: Payer: Self-pay | Admitting: Orthopedic Surgery

## 2020-08-25 NOTE — Progress Notes (Signed)
Office Visit Note   Patient: Alice [name]           Date of Birth: 31-Mar-1971           MRN: [id number] Visit Date: 08/24/2020              Requested by: Benito Mccreedy, MD [id number] [place],  [place] [address] PCP: Benito Mccreedy, MD  Chief Complaint  Patient presents with  . Right Leg - Follow-up      HPI: Patient is seen in follow-up for chronic venous ulcer right lower extremity.  Patient states she was pulling off part of a scab when she had acute bleeding that would not stop she applied a compression dressing and is seen today for evaluation.  She states she has been wearing her knee-high compression stockings daily.  Assessment & Plan: Visit Diagnoses:  1. 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  Follow-Up Instructions: Return in about 4 weeks (around 09/21/2020).   Ortho Exam  Patient is alert, oriented, no adenopathy, well-dressed, normal affect, normal respiratory effort. Examination patient has a skin tear inferiorly.  A 10 blade knife was used to debride the scab and torn skin silver nitrate was used hemostasis pressure was applied she has excellent granulation tissue over the wound bed.  6 cm in diameter and 1 mm deep.  There is no cellulitis no signs of infection.  Imaging: No results found. No images are attached to the encounter.  Labs: Lab Results  Component Value Date   REPTSTATUS 10/06/2018 FINAL 10/05/2018   GRAMSTAIN  09/28/2018    FEW WBC PRESENT, PREDOMINANTLY PMN FEW GRAM POSITIVE COCCI IN PAIRS FEW GRAM POSITIVE RODS    CULT 20,000 COLONIES/mL YEAST (A) 10/05/2018   LABORGA PROTEUS MIRABILIS 09/28/2018   LABORGA STAPHYLOCOCCUS CAPRAE 09/28/2018   LABORGA SERRATIA MARCESCENS 09/28/2018     Lab Results  Component Value Date   ALBUMIN 3.4 (L) 05/12/2019   ALBUMIN 2.5  (L) 10/08/2018   ALBUMIN 3.2 (L) 01/29/2018    Lab Results  Component Value Date   MG 1.9 08/19/2019   MG 1.8 12/29/2016   MG 1.9 12/28/2016   No results found for: VD25OH  No results found for: PREALBUMIN CBC EXTENDED Latest Ref Rng & Units 08/19/2019 05/12/2019 10/13/2018  WBC 4.0 - 10.5 K/uL 7.5 5.3 8.4  RBC 3.87 - 5.11 MIL/uL 4.37 5.20(H) 3.92  HGB 12.0 - 15.0 g/dL 12.3 14.4 9.9(L)  HCT 36.0 - 46.0 % 36.7 43.1 30.3(L)  PLT 150 - 400 K/uL 236 240 389  NEUTROABS 1.7 - 7.7 K/uL 5.2 - -  LYMPHSABS 0.7 - 4.0 K/uL 1.5 - -     There is no height or weight on file to calculate BMI.  Orders:  No orders of the defined types were placed in this encounter.  No orders of the defined types were placed in this encounter.    Procedures: No procedures performed  Clinical Data: No additional findings.  ROS:  All other systems negative, except as noted in the HPI. Review of Systems  Objective: Vital Signs: There were no vitals taken for this visit.  Specialty Comments:  No specialty comments available.  PMFS History: Patient Active Problem List   Diagnosis Date Noted  . Subtherapeutic international normalized ratio (INR)   . Postoperative pain   . Hypoalbuminemia   .  Acute on chronic kidney failure ([place])   . Acute blood loss anemia   . Benign essential HTN   . Debility 10/05/2018  . Chronic ulcer of right leg, with fat layer exposed ([place]) 09/28/2018  . Chronic ulcer of leg, right, with necrosis of muscle ([place])   . Iron deficiency anemia 01/29/2018  . Pulmonary embolus ([place]) 01/09/2018  . Encounter for therapeutic drug monitoring 01/09/2018  . Severe malnutrition ([place]) 08/24/2017  . Acute kidney injury superimposed on CKD ([place]) 12/28/2016  . Hx of skin graft 08/01/2016  . 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 ([place]) 07/25/2016  . 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  Trichomonal infection 11/24/2015  . Abdominal pain 05/02/2015  . Symptomatic cholelithiasis 04/16/2015  . Acute bilateral upper abdominal pain 04/16/2015  . Microcytic anemia 05/18/2013  . Hypotension, unspecified 05/17/2013  . CKD (chronic kidney disease), stage III ([place]) 05/17/2013  . Hypokalemia 05/17/2013  . Anal fissure 02/06/2013  . Anal skin tag 02/06/2013  . ALLERGIC RHINITIS 03/05/2010  . LOW BACK PAIN SYNDROME 12/13/2007  . OBESITY, MORBID 12/02/2006  . HTN (hypertension) 12/02/2006  . History of pulmonary embolism 12/02/2006  . History of DVT (deep vein thrombosis) 12/02/2006  . 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 GASTROESOPHAGEAL REFLUX DISEASE 12/02/2006  . OSA (obstructive sleep apnea) 12/02/2006   Past Medical History:  Diagnosis Date  . Anemia   . Anxiety   . Arthritis   . Chronic bronchitis ([place])   . Chronic upper back pain   . Depression   . DVT (deep venous thrombosis) (HCC)    BLE  . GERD (gastroesophageal reflux disease)   . Headache    "weekly" (04/16/2015)  . Hypertension   . Migraine    "monthly" (04/16/2015)  . Morbid obesity with BMI of 50.0-59.9, adult ([place] )   . Obesity   . Pneumonia 2014  . Pulmonary embolism ([place])   . Sinusitis nasal   . Sleep apnea    "I'm suppose to wear a mask but I don't" (04/16/2015)  . Varicose veins of right lower extremity   . Venous stasis of lower extremity    right  . Wears dentures   . Wears glasses     Family History  Problem Relation Age of Onset  . Kidney disease Mother        kidney transplant    Past Surgical History:  Procedure Laterality Date  . CHOLECYSTECTOMY N/A 04/18/2015   Procedure: LAPAROSCOPIC CHOLECYSTECTOMY;  Surgeon: Ralene Ok, MD;  Location: [facility];  Service: General;  Laterality: N/A;  . HYSTEROSCOPY WITH D & C  12/24/2001   Archie Endo 09/28/2010  . I & D EXTREMITY Right 07/25/2016   Procedure: IRRIGATION AND DEBRIDEMENT RIGHT LEG ULCER, APPLY VERAFLO VAC;  Surgeon:  [name], MD;  Location: [facility];  Service: Orthopedics;  Laterality: Right;  . I & D EXTREMITY Right 09/28/2018   Procedure: DEBRIDEMENT RIGHT LOWER LEG WITH PLACEMENT WITH PLACEMENT OF SKIN GRAFT AND VAC;  Surgeon: [name], MD;  Location: [facility];  Service: Orthopedics;  Laterality: Right;  . I & D EXTREMITY Right 10/03/2018   Procedure: REPEAT IRRIGATION AND DEBRIDEMENT RIGHT LOWER LEG, VAC PLACEMENT;  Surgeon: [name], MD;  Location: [facility];  Service: Orthopedics;  Laterality: Right;  . INCISE AND DRAIN ABCESS Right 07/14/2016  . INCISION AND DRAINAGE Right 09/10/2008   leg:  skin and soft  tissue and muscle/notes 09/15/2010  . INCISION AND DRAINAGE Right 01/01/2008   Chronic venous stasis insufficiency ulcer,/notes 09/14/2010/  . INCISION AND DRAINAGE Right [id number]   calf w/application wound vac/notes 07/20/2010  . INCISION AND DRAINAGE Right 07/25/2016   IRRIGATION AND DEBRIDEMENT RIGHT LEG ULCER,  . LAPAROSCOPIC GASTRIC BYPASS  ~ 2007  . MULTIPLE TOOTH EXTRACTIONS    . SKIN GRAFT SPLIT THICKNESS LEG / FOOT Right 07/25/2016   LEG  . SKIN SPLIT GRAFT Right 07/27/2016   Procedure: SKIN GRAFT RIGHT LEG WITH THERASKIN APPLICATION;  Surgeon: [name], MD;  Location: [facility];  Service: Orthopedics;  Laterality: Right;  . TONSILLECTOMY     Social History   Occupational History  . Not on file  Tobacco Use  . Smoking status: Never Smoker  . Smokeless tobacco: Never Used  Vaping Use  . Vaping Use: Never used  Substance and Sexual Activity  . Alcohol use: No  . Drug use: No  . Sexual activity: Yes    Partners: Male    Birth control/protection: I.U.D.

## 2020-09-01 DIAGNOSIS — H524 Presbyopia: Secondary | ICD-10-CM | POA: Diagnosis not present

## 2020-09-01 DIAGNOSIS — H0288A Meibomian gland dysfunction right eye, upper and lower eyelids: Secondary | ICD-10-CM | POA: Diagnosis not present

## 2020-09-01 DIAGNOSIS — H5213 Myopia, bilateral: Secondary | ICD-10-CM | POA: Diagnosis not present

## 2020-09-01 DIAGNOSIS — H0288B Meibomian gland dysfunction left eye, upper and lower eyelids: Secondary | ICD-10-CM | POA: Diagnosis not present

## 2020-09-01 DIAGNOSIS — H35033 Hypertensive retinopathy, bilateral: Secondary | ICD-10-CM | POA: Diagnosis not present

## 2020-09-01 DIAGNOSIS — H33322 Round hole, left eye: Secondary | ICD-10-CM | POA: Diagnosis not present

## 2020-09-01 DIAGNOSIS — H52223 Regular astigmatism, bilateral: Secondary | ICD-10-CM | POA: Diagnosis not present

## 2020-09-07 NOTE — Progress Notes (Addendum)
[facility] Note  09/08/2020     CHIEF COMPLAINT Patient presents for Retina Follow Up   HISTORY OF PRESENT ILLNESS: Alice [name] is a 50 y.o. female who presents to the clinic today for:   HPI    Retina Follow Up    Patient presents with  Other.  In left eye.  Duration of 1 week.  I, the attending physician,  performed the HPI with the patient and updated documentation appropriately.          Comments    Retina eval per Dr. Katy Fitch for possible retinal hole OS- Patient seen Dr. Katy Fitch last Monday.  She states she was told she had a red dot in the bottom of OS.  No symptoms associated with this states patient.         Last edited by Bernarda Caffey, MD on 09/08/2020 11:13 PM. (History)    pt is here on the referral of Shirleen Schirmer, PA-C for concern of retinal hole OS, pt saw him for a routine eye exam, she states Lundquist saw a "red dot" in her left eye, pt states she is on Eliquis for blood clots and takes medication for HTN  Referring physician: [facility] [address] [place],  [place] [address]  HISTORICAL INFORMATION:   Selected notes from the MEDICAL RECORD NUMBER Referred by Dr. Katy Fitch for questionable inferior retinal hole   CURRENT MEDICATIONS: No current outpatient medications on file. (Ophthalmic Drugs)   No current facility-administered medications for this visit. (Ophthalmic Drugs)   Current Outpatient Medications (Other)  Medication Sig  . acetaminophen (TYLENOL) 325 MG tablet Take 1-2 tablets (325-650 mg total) by mouth every 4 (four) hours as needed for mild pain.  Marland Kitchen albuterol (PROVENTIL HFA;VENTOLIN HFA) 108 (90 Base) MCG/ACT inhaler Inhale 2 puffs into the lungs every 6 (six) hours as needed for wheezing or shortness of breath.   Marland Kitchen amLODipine (NORVASC) 10 MG tablet Take 1 tablet (10 mg total) by mouth daily.  Marland Kitchen buPROPion (WELLBUTRIN XL) 300 MG 24 hr tablet Take 300 mg by mouth daily.  Marland Kitchen ELIQUIS 2.5  MG TABS tablet TAKE 1 TABLET(2.5 MG) BY MOUTH TWICE DAILY. FOLLOW-UP AS NEEDED FOR MORE REFILLS  . ferrous sulfate 325 (65 FE) MG tablet Take 1 tablet (325 mg total) by mouth daily with breakfast.  . furosemide (LASIX) 20 MG tablet Take 1 tablet (20 mg total) by mouth daily.  . hydrocerin (EUCERIN) CREA Apply 1 application topically 2 (two) times daily.  . nitroGLYCERIN (NITROSTAT) 0.4 MG SL tablet Place 1 tablet (0.4 mg total) under the tongue every 5 (five) minutes x 3 doses as needed for chest pain.  . pantoprazole (PROTONIX) 40 MG tablet Take 1 tablet (40 mg total) by mouth daily. For acid reflux  . potassium chloride SA (KLOR-CON) 20 MEQ tablet Take 1 tablet (20 mEq total) by mouth daily.  . pregabalin (LYRICA) 200 MG capsule Take 200 mg by mouth 2 (two) times a day.  . hydrochlorothiazide (HYDRODIURIL) 25 MG tablet Take 1 tablet (25 mg total) by mouth daily.   No current facility-administered medications for this visit. (Other)      REVIEW OF SYSTEMS: ROS    Positive for: Gastrointestinal, Genitourinary, Eyes, Respiratory, Psychiatric, Allergic/Imm   Negative for: Constitutional, Neurological, Skin, Musculoskeletal, HENT, Endocrine, Cardiovascular, Heme/Lymph   Last edited by Leonie Douglas, COA on 09/08/2020  8:52 AM. (History)       ALLERGIES Allergies  Allergen Reactions  . Vicodin [Hydrocodone-Acetaminophen] Nausea And Vomiting    PAST MEDICAL HISTORY Past Medical History:  Diagnosis Date  . Anemia   . Anxiety   . Arthritis   . Chronic bronchitis ([place])   . Chronic upper back pain   . Depression   . DVT (deep venous thrombosis) ([facility])    BLE  . GERD (gastroesophageal reflux disease)   . Headache    "weekly" (04/16/2015)  . Hypertension   . Migraine    "monthly" (04/16/2015)  . Morbid obesity with BMI of 50.0-59.9, adult ([place])   . Obesity   . Pneumonia 2014  . Pulmonary embolism ([place])   . Sinusitis nasal   . Sleep apnea    "I'm suppose to wear a mask but I  don't" (04/16/2015)  . Varicose veins of right lower extremity   . Venous stasis of lower extremity    right  . Wears dentures   . Wears glasses    Past Surgical History:  Procedure Laterality Date  . CHOLECYSTECTOMY N/A 04/18/2015   Procedure: LAPAROSCOPIC CHOLECYSTECTOMY;  Surgeon: Ralene Ok, MD;  Location: [place];  Service: General;  Laterality: N/A;  . HYSTEROSCOPY WITH D & C  12/24/2001   Archie Endo 09/28/2010  . I & D EXTREMITY Right 07/25/2016   Procedure: IRRIGATION AND DEBRIDEMENT RIGHT LEG ULCER, APPLY VERAFLO VAC;  Surgeon: [name], MD;  Location: [place];  Service: Orthopedics;  Laterality: Right;  . I & D EXTREMITY Right 09/28/2018   Procedure: DEBRIDEMENT RIGHT LOWER LEG WITH PLACEMENT WITH PLACEMENT OF SKIN GRAFT AND VAC;  Surgeon: [name], MD;  Location: [place];  Service: Orthopedics;  Laterality: Right;  . I & D EXTREMITY Right 10/03/2018   Procedure: REPEAT IRRIGATION AND DEBRIDEMENT RIGHT LOWER LEG, VAC PLACEMENT;  Surgeon: [name], MD;  Location: [place];  Service: Orthopedics;  Laterality: Right;  . INCISE AND DRAIN ABCESS Right 07/14/2016  . INCISION AND DRAINAGE Right 09/10/2008   leg:  skin and soft tissue and muscle/notes 09/15/2010  . INCISION AND DRAINAGE Right 01/01/2008   Chronic venous stasis insufficiency ulcer,/notes 09/14/2010/  . INCISION AND DRAINAGE Right [id number]   calf w/application wound vac/notes 07/20/2010  . INCISION AND DRAINAGE Right 07/25/2016   IRRIGATION AND DEBRIDEMENT RIGHT LEG ULCER,  . LAPAROSCOPIC GASTRIC BYPASS  ~ 2007  . MULTIPLE TOOTH EXTRACTIONS    . SKIN GRAFT SPLIT THICKNESS LEG / FOOT Right 07/25/2016   LEG  . SKIN SPLIT GRAFT Right 07/27/2016   Procedure: SKIN GRAFT RIGHT LEG WITH THERASKIN APPLICATION;  Surgeon: [name], MD;  Location: [place];  Service: Orthopedics;  Laterality: Right;  . TONSILLECTOMY      FAMILY HISTORY Family History  Problem Relation Age of Onset  . Kidney disease Mother        kidney  transplant    SOCIAL HISTORY Social History   Tobacco Use  . Smoking status: Never Smoker  . Smokeless tobacco: Never Used  Vaping Use  . Vaping Use: Never used  Substance Use Topics  . Alcohol use: No  . Drug use: No         OPHTHALMIC EXAM:  Base Eye Exam    Visual Acuity (Snellen - Linear)      Right Left   Dist cc 20/25 -1 20/30 +2   Dist ph cc NI NI       Tonometry (Tonopen, 9:04 AM)      Right Left   Pressure  18 17       Pupils      Dark Light Shape React APD   Right 3 2 Round Brisk None   Left 3 2 Round Brisk None       Visual Fields      Left Right    Full Full       Extraocular Movement      Right Left    Full Full       Neuro/Psych    Oriented x3: Yes   Mood/Affect: Normal       Dilation    Both eyes: 1.0% Mydriacyl, 2.5% Phenylephrine @ 9:04 AM        Slit Lamp and Fundus Exam    Slit Lamp Exam      Right Left   Lids/Lashes Normal Normal   Conjunctiva/Sclera mild melanosis mild melanosis   Cornea 1+ Punctate epithelial erosions, mild tear film debris 1+ Punctate epithelial erosions, mild tear film debris   Anterior Chamber Deep and quiet Deep and quiet   Iris Round and dilated Round and dilated   Lens Trace Cortical cataract Trace Cortical cataract   Vitreous Vitreous syneresis Vitreous syneresis       Fundus Exam      Right Left   Disc Pink and Sharp, mild PPA Pink and Sharp, Thin superior rim   C/D Ratio 0.5 0.6   Macula Flat, Good foveal reflex, RPE mottling, No heme or edema Flat, Good foveal reflex, RPE mottling, No heme or edema   Vessels mild attenuation mild attenuation   Periphery Attached, focal pigmented lattice from 0430-0500 equator, White without pressure temporally Attached, mild White without pressure temporally, mild focal pigmented lattice from 0600-0700 equator        Refraction    Wearing Rx      Sphere Cylinder Axis   Right -6.00 +1.00 098   Left -6.00 Sphere        Manifest Refraction      Sphere  Cylinder Axis Dist VA   Right -6.25 +1.00 098 NI   Left -6.00 Sphere  NI          IMAGING AND PROCEDURES  Imaging and Procedures for 09/08/2020  OCT, Retina - OU - Both Eyes       Right Eye Quality was good. Central Foveal Thickness: 235. Progression has no prior data. Findings include normal foveal contour, no IRF, no SRF, myopic contour.   Left Eye Quality was good. Central Foveal Thickness: 235. Progression has no prior data. Findings include normal foveal contour, no IRF, no SRF, myopic contour.   Notes *Images captured and stored on drive  Diagnosis / Impression:  NFP; no IRF/SRF OU   Clinical management:  See below  Abbreviations: NFP - Normal foveal profile. CME - cystoid macular edema. PED - pigment epithelial detachment. IRF - intraretinal fluid. SRF - subretinal fluid. EZ - ellipsoid zone. ERM - epiretinal membrane. ORA - outer retinal atrophy. ORT - outer retinal tubulation. SRHM - subretinal hyper-reflective material. IRHM - intraretinal hyper-reflective material               ASSESSMENT/PLAN:    ICD-10-CM   1. Bilateral retinal lattice degeneration  H35.413   2. Retinal hole of both eyes  H33.323   3. Retinal edema  H35.81 OCT, Retina - OU - Both Eyes  4. Essential hypertension  I10   5. Hypertensive retinopathy of both eyes  H35.033   6. Nuclear sclerosis of both eyes  H25.13  1-3. Lattice degeneration w/ atrophic holes, both eyes - OD: focal pigmented lattice from 0430-0500 equator - OS: mild focal pigmented lattice from 0700-0730 - discussed findings, prognosis, and treatment options including observation - recommend laser retinopexy OU, OS first - pt wishes to schedule laser for next week - f/u 09/16/20 -- Laser retinopexy OS  4,5. Hypertensive retinopathy OU - discussed importance of tight BP control - monitor  6. Nuclear Sclerosis OU - The symptoms of cataract, surgical options, and treatments and risks were discussed with patient. -  discussed diagnosis and progression - not yet visually significant - monitor for now  Ophthalmic Meds Ordered this visit:  No orders of the defined types were placed in this encounter.      Return in about 8 days (around 09/16/2020) for Laser retinopexy OS.  There are no Patient Instructions on file for this visit.   Explained the diagnoses, plan, and follow up with the patient and they expressed understanding.  Patient expressed understanding of the importance of proper follow up care.   This document serves as a record of services personally performed by Gardiner Sleeper, MD, PhD. It was created on their behalf by Estill Bakes, COT an ophthalmic technician. The creation of this record is the provider's dictation and/or activities during the visit.    Electronically signed by: Estill Bakes, COT 4.25.22 @ 11:38 PM   This document serves as a record of services personally performed by Gardiner Sleeper, MD, PhD. It was created on their behalf by San Jetty. Owens Shark, OA an ophthalmic technician. The creation of this record is the provider's dictation and/or activities during the visit.    Electronically signed by: San Jetty. [place], [place] 04.26.2022 11:38 PM   Gardiner Sleeper, M.D., Ph.D. Diseases & Surgery of the Retina and Vitreous [facility]  I have reviewed the above documentation for accuracy and completeness, and I agree with the above. Gardiner Sleeper, M.D., Ph.D. 09/08/20 11:38 PM   Abbreviations: M myopia (nearsighted); A astigmatism; H hyperopia (farsighted); P presbyopia; Mrx spectacle prescription;  CTL contact lenses; OD right eye; OS left eye; OU both eyes  XT exotropia; ET esotropia; PEK punctate epithelial keratitis; PEE punctate epithelial erosions; DES dry eye syndrome; MGD meibomian gland dysfunction; ATs artificial tears; PFAT's preservative free artificial tears; [place] nuclear sclerotic cataract; PSC posterior subcapsular cataract; ERM epi-retinal  membrane; PVD posterior vitreous detachment; RD retinal detachment; DM diabetes mellitus; DR diabetic retinopathy; NPDR non-proliferative diabetic retinopathy; PDR proliferative diabetic retinopathy; CSME clinically significant macular edema; DME diabetic macular edema; dbh dot blot hemorrhages; CWS cotton wool spot; POAG primary open angle glaucoma; C/D cup-to-disc ratio; HVF humphrey visual field; GVF goldmann visual field; OCT optical coherence tomography; IOP intraocular pressure; BRVO Branch retinal vein occlusion; CRVO central retinal vein occlusion; CRAO central retinal artery occlusion; BRAO branch retinal artery occlusion; RT retinal tear; SB scleral buckle; PPV pars plana vitrectomy; VH Vitreous hemorrhage; PRP panretinal laser photocoagulation; IVK intravitreal kenalog; VMT vitreomacular traction; MH Macular hole;  NVD neovascularization of the disc; NVE neovascularization elsewhere; AREDS age related eye disease study; ARMD age related macular degeneration; POAG primary open angle glaucoma; EBMD epithelial/anterior basement membrane dystrophy; ACIOL anterior chamber intraocular lens; IOL intraocular lens; PCIOL posterior chamber intraocular lens; Phaco/IOL phacoemulsification with intraocular lens placement; [place] photorefractive keratectomy; LASIK laser assisted in situ keratomileusis; HTN hypertension; DM diabetes mellitus; COPD chronic obstructive pulmonary disease

## 2020-09-08 ENCOUNTER — Encounter (INDEPENDENT_AMBULATORY_CARE_PROVIDER_SITE_OTHER): Payer: Self-pay | Admitting: Ophthalmology

## 2020-09-08 ENCOUNTER — Other Ambulatory Visit: Payer: Self-pay

## 2020-09-08 ENCOUNTER — Ambulatory Visit (INDEPENDENT_AMBULATORY_CARE_PROVIDER_SITE_OTHER): Payer: Medicare Other | Admitting: Ophthalmology

## 2020-09-08 DIAGNOSIS — H35033 Hypertensive retinopathy, bilateral: Secondary | ICD-10-CM

## 2020-09-08 DIAGNOSIS — H33323 Round hole, bilateral: Secondary | ICD-10-CM

## 2020-09-08 DIAGNOSIS — H2513 Age-related nuclear cataract, bilateral: Secondary | ICD-10-CM

## 2020-09-08 DIAGNOSIS — I10 Essential (primary) hypertension: Secondary | ICD-10-CM

## 2020-09-08 DIAGNOSIS — H3581 Retinal edema: Secondary | ICD-10-CM

## 2020-09-08 DIAGNOSIS — H35413 Lattice degeneration of retina, bilateral: Secondary | ICD-10-CM

## 2020-09-15 ENCOUNTER — Ambulatory Visit (INDEPENDENT_AMBULATORY_CARE_PROVIDER_SITE_OTHER): Payer: Medicare Other | Admitting: Family Medicine

## 2020-09-15 ENCOUNTER — Other Ambulatory Visit: Payer: Self-pay

## 2020-09-15 ENCOUNTER — Encounter (INDEPENDENT_AMBULATORY_CARE_PROVIDER_SITE_OTHER): Payer: Self-pay | Admitting: Family Medicine

## 2020-09-15 VITALS — BP 131/84 | HR 72 | Temp 97.9°F | Ht 68.0 in | Wt 289.0 lb

## 2020-09-15 DIAGNOSIS — E559 Vitamin D deficiency, unspecified: Secondary | ICD-10-CM | POA: Diagnosis not present

## 2020-09-15 DIAGNOSIS — Z6841 Body Mass Index (BMI) 40.0 and over, adult: Secondary | ICD-10-CM | POA: Diagnosis not present

## 2020-09-15 DIAGNOSIS — D508 Other iron deficiency anemias: Secondary | ICD-10-CM | POA: Diagnosis not present

## 2020-09-15 DIAGNOSIS — N1832 Chronic kidney disease, stage 3b: Secondary | ICD-10-CM | POA: Diagnosis not present

## 2020-09-15 DIAGNOSIS — R739 Hyperglycemia, unspecified: Secondary | ICD-10-CM | POA: Diagnosis not present

## 2020-09-15 DIAGNOSIS — D631 Anemia in chronic kidney disease: Secondary | ICD-10-CM | POA: Diagnosis not present

## 2020-09-15 DIAGNOSIS — R0602 Shortness of breath: Secondary | ICD-10-CM | POA: Insufficient documentation

## 2020-09-15 DIAGNOSIS — R5383 Other fatigue: Secondary | ICD-10-CM | POA: Diagnosis not present

## 2020-09-15 DIAGNOSIS — D518 Other vitamin B12 deficiency anemias: Secondary | ICD-10-CM

## 2020-09-15 DIAGNOSIS — Z1331 Encounter for screening for depression: Secondary | ICD-10-CM

## 2020-09-15 DIAGNOSIS — Z0289 Encounter for other administrative examinations: Secondary | ICD-10-CM

## 2020-09-15 DIAGNOSIS — I10 Essential (primary) hypertension: Secondary | ICD-10-CM | POA: Diagnosis not present

## 2020-09-15 NOTE — Progress Notes (Signed)
[facility] Note  09/16/2020     CHIEF COMPLAINT Patient presents for Retina Follow Up   HISTORY OF PRESENT ILLNESS: Alice [name] is a 50 y.o. female who presents to the clinic today for:   HPI    Retina Follow Up    Patient presents with  Other.  In both eyes.  This started 1 week ago.  I, the attending physician,  performed the HPI with the patient and updated documentation appropriately.          Comments    Patient here for 1 week retina follow up for lattice degeneration OU. Patient states vision doing ok. Has new glasses on order. No eye pain.        Last edited by Bernarda Caffey, MD on 09/16/2020 11:55 AM. (History)    pt here for laser retinopexy OS  Referring physician: Benito Mccreedy, MD [id number] [place],  [place] [address]  HISTORICAL INFORMATION:   Selected notes from the MEDICAL RECORD NUMBER Referred by Dr. Katy Fitch for questionable inferior retinal hole   CURRENT MEDICATIONS: Current Outpatient Medications (Ophthalmic Drugs)  Medication Sig  . prednisoLONE acetate (PRED FORTE) 1 % ophthalmic suspension Place 1 drop into the left eye 4 (four) times daily for 7 days.   No current facility-administered medications for this visit. (Ophthalmic Drugs)   Current Outpatient Medications (Other)  Medication Sig  . acetaminophen (TYLENOL) 325 MG tablet Take 1-2 tablets (325-650 mg total) by mouth every 4 (four) hours as needed for mild pain.  Marland Kitchen albuterol (PROVENTIL HFA;VENTOLIN HFA) 108 (90 Base) MCG/ACT inhaler Inhale 2 puffs into the lungs every 6 (six) hours as needed for wheezing or shortness of breath.   Marland Kitchen amLODipine (NORVASC) 10 MG tablet Take 1 tablet (10 mg total) by mouth daily.  Marland Kitchen buPROPion (WELLBUTRIN XL) 300 MG 24 hr tablet Take 300 mg by mouth daily.  Marland Kitchen ELIQUIS 2.5 MG TABS tablet TAKE 1 TABLET(2.5 MG) BY MOUTH TWICE DAILY. FOLLOW-UP AS NEEDED FOR MORE REFILLS  . ferrous sulfate 325 (65 FE) MG tablet Take  1 tablet (325 mg total) by mouth daily with breakfast.  . furosemide (LASIX) 20 MG tablet Take 1 tablet (20 mg total) by mouth daily.  . hydrochlorothiazide (HYDRODIURIL) 25 MG tablet Take 1 tablet (25 mg total) by mouth daily.  . hydrochlorothiazide (HYDRODIURIL) 25 MG tablet Take 25 mg by mouth daily.  . nitroGLYCERIN (NITROSTAT) 0.4 MG SL tablet Place 1 tablet (0.4 mg total) under the tongue every 5 (five) minutes x 3 doses as needed for chest pain.  . pantoprazole (PROTONIX) 40 MG tablet Take 1 tablet (40 mg total) by mouth daily. For acid reflux  . potassium chloride SA (KLOR-CON) 20 MEQ tablet Take 1 tablet (20 mEq total) by mouth daily.  . pregabalin (LYRICA) 200 MG capsule Take 200 mg by mouth 2 (two) times a day.  Marland Kitchen tiZANidine (ZANAFLEX) 4 MG tablet Take 4 mg by mouth every 6 (six) hours as needed for muscle spasms.   No current facility-administered medications for this visit. (Other)      REVIEW OF SYSTEMS: ROS    Positive for: Gastrointestinal, Genitourinary, Eyes, Respiratory, Psychiatric, Allergic/Imm   Negative for: Constitutional, Neurological, Skin, Musculoskeletal, HENT, Endocrine, Cardiovascular, Heme/Lymph   Last edited by Theodore Demark, COA on 09/16/2020 10:22 AM. (History)       ALLERGIES Allergies  Allergen Reactions  . Vicodin [Hydrocodone-Acetaminophen] Nausea And Vomiting    PAST  MEDICAL HISTORY Past Medical History:  Diagnosis Date  . Anemia   . Anxiety   . Arthritis   . Chest pain   . Chronic bronchitis ([place])   . Chronic upper back pain   . Depression   . Depression   . DVT (deep venous thrombosis) (HCC)    BLE  . GERD (gastroesophageal reflux disease)   . Headache    "weekly" (04/16/2015)  . Hyperlipidemia   . Hypertension   . Joint pain   . Migraine    "monthly" (04/16/2015)  . Morbid obesity with BMI of 50.0-59.9, adult ([facility])   . Obesity   . Other fatigue   . Pneumonia 2014  . Pulmonary embolism ([place])   . Sinusitis nasal   .  Sleep apnea    "I'm suppose to wear a mask but I don't" (04/16/2015)  . SOB (shortness of breath) on exertion   . Varicose veins of right lower extremity   . Venous stasis of lower extremity    right  . Wears dentures   . Wears glasses    Past Surgical History:  Procedure Laterality Date  . CHOLECYSTECTOMY N/A 04/18/2015   Procedure: LAPAROSCOPIC CHOLECYSTECTOMY;  Surgeon: Ralene Ok, MD;  Location: [facility];  Service: General;  Laterality: N/A;  . HYSTEROSCOPY WITH D & C  12/24/2001   Archie Endo 09/28/2010  . I & D EXTREMITY Right 07/25/2016   Procedure: IRRIGATION AND DEBRIDEMENT RIGHT LEG ULCER, APPLY VERAFLO VAC;  Surgeon: [name], MD;  Location: [facility];  Service: Orthopedics;  Laterality: Right;  . I & D EXTREMITY Right 09/28/2018   Procedure: DEBRIDEMENT RIGHT LOWER LEG WITH PLACEMENT WITH PLACEMENT OF SKIN GRAFT AND VAC;  Surgeon: [name], MD;  Location: [facility];  Service: Orthopedics;  Laterality: Right;  . I & D EXTREMITY Right 10/03/2018   Procedure: REPEAT IRRIGATION AND DEBRIDEMENT RIGHT LOWER LEG, VAC PLACEMENT;  Surgeon: [name], MD;  Location: [facility];  Service: Orthopedics;  Laterality: Right;  . INCISE AND DRAIN ABCESS Right 07/14/2016  . INCISION AND DRAINAGE Right 09/10/2008   leg:  skin and soft tissue and muscle/notes 09/15/2010  . INCISION AND DRAINAGE Right 01/01/2008   Chronic venous stasis insufficiency ulcer,/notes 09/14/2010/  . INCISION AND DRAINAGE Right [id number]   calf w/application wound vac/notes 07/20/2010  . INCISION AND DRAINAGE Right 07/25/2016   IRRIGATION AND DEBRIDEMENT RIGHT LEG ULCER,  . LAPAROSCOPIC GASTRIC BYPASS  ~ 2007  . MULTIPLE TOOTH EXTRACTIONS    . SKIN GRAFT SPLIT THICKNESS LEG / FOOT Right 07/25/2016   LEG  . SKIN SPLIT GRAFT Right 07/27/2016   Procedure: SKIN GRAFT RIGHT LEG WITH THERASKIN APPLICATION;  Surgeon: [name], MD;  Location: [facility];  Service: Orthopedics;  Laterality: Right;  . TONSILLECTOMY      FAMILY  HISTORY Family History  Problem Relation Age of Onset  . Kidney disease Mother        kidney transplant  . Diabetes Mother   . Hyperlipidemia Mother   . Hypertension Mother   . Stroke Mother   . Diabetes Father   . Hypertension Father   . Hyperlipidemia Father   . Stroke Father   . Kidney disease Father   . Schizophrenia Father   . Alcoholism Father     SOCIAL HISTORY Social History   Tobacco Use  . Smoking status: Never Smoker  . Smokeless tobacco: Never Used  Vaping Use  . Vaping Use: Never used  Substance Use  Topics  . Alcohol use: No  . Drug use: No         OPHTHALMIC EXAM:  Base Eye Exam    Visual Acuity (Snellen - Linear)      Right Left   Dist cc 20/25 -1 20/30 +1   Dist ph cc NI NI   Correction: Glasses       Tonometry (Tonopen, 10:19 AM)      Right Left   Pressure 21 21       Pupils      Dark Light Shape React APD   Right 3 2 Round Brisk None   Left 3 2 Round Brisk None       Visual Fields (Counting fingers)      Left Right    Full Full       Extraocular Movement      Right Left    Full Full       Neuro/Psych    Oriented x3: Yes   Mood/Affect: Normal       Dilation    Both eyes: 1.0% Mydriacyl, 2.5% Phenylephrine @ 10:19 AM        Slit Lamp and Fundus Exam    Slit Lamp Exam      Right Left   Lids/Lashes Normal Normal   Conjunctiva/Sclera mild melanosis mild melanosis   Cornea 1+ Punctate epithelial erosions, mild tear film debris 1+ Punctate epithelial erosions, mild tear film debris   Anterior Chamber Deep and quiet Deep and quiet   Iris Round and dilated Round and dilated   Lens Trace Cortical cataract Trace Cortical cataract   Vitreous Vitreous syneresis Vitreous syneresis       Fundus Exam      Right Left   Disc Pink and Sharp, mild PPA Pink and Sharp, Thin superior rim   C/D Ratio 0.5 0.6   Macula Flat, Good foveal reflex, RPE mottling, No heme or edema Flat, Good foveal reflex, RPE mottling, No heme or edema    Vessels mild attenuation mild attenuation   Periphery Attached, focal pigmented lattice from 0430-0500 equator, White without pressure temporally Attached, mild White without pressure temporally, mild focal pigmented lattice from 0500-0600 equator        Refraction    Wearing Rx      Sphere Cylinder Axis   Right -6.00 +1.00 098   Left -6.00 Sphere           IMAGING AND PROCEDURES  Imaging and Procedures for 09/16/2020  OCT, Retina - OU - Both Eyes       Right Eye Quality was good. Central Foveal Thickness: 249. Progression has been stable. Findings include normal foveal contour, no IRF, no SRF, myopic contour.   Left Eye Quality was good. Central Foveal Thickness: 241. Progression has been stable. Findings include normal foveal contour, no IRF, no SRF, myopic contour.   Notes *Images captured and stored on drive  Diagnosis / Impression:  NFP; no IRF/SRF OU  Clinical management:  See below  Abbreviations: NFP - Normal foveal profile. CME - cystoid macular edema. PED - pigment epithelial detachment. IRF - intraretinal fluid. SRF - subretinal fluid. EZ - ellipsoid zone. ERM - epiretinal membrane. ORA - outer retinal atrophy. ORT - outer retinal tubulation. SRHM - subretinal hyper-reflective material. IRHM - intraretinal hyper-reflective material        Repair Retinal Breaks, Laser - OS - Left Eye       LASER PROCEDURE NOTE  Procedure:  Barrier laser retinopexy using  slit lamp laser, LEFT eye   Diagnosis:   Lattice degeneration w/ atrophic holes, LEFT eye                     Patches of lattice: 0500-0600 equator  Surgeon: Bernarda Caffey, MD, PhD  Anesthesia: Topical  Informed consent obtained, operative eye marked, and time out performed prior to initiation of laser.   Laser settings:  Lumenis [id number] laser, slit lamp Lens: Mainster PRP 165 Power: 250 mW Spot size: 200 microns Duration: 30 msec  # spots: 301  Placement of laser: Using a Mainster PRP 165  contact lens at the slit lamp, laser was placed in three confluent rows around patches of lattice w/ atrophic holes from 5-6 oclock equator.  Complications: None.  Patient tolerated the procedure well and received written and verbal post-procedure care information/education.               ASSESSMENT/PLAN:    ICD-10-CM   1. Bilateral retinal lattice degeneration  H35.413 Repair Retinal Breaks, Laser - OS - Left Eye  2. Retinal hole of both eyes  H33.323 Repair Retinal Breaks, Laser - OS - Left Eye  3. Retinal edema  H35.81 OCT, Retina - OU - Both Eyes  4. Essential hypertension  I10   5. Hypertensive retinopathy of both eyes  H35.033   6. Nuclear sclerosis of both eyes  H25.13     1-3. Lattice degeneration w/ atrophic holes, both eyes - OD: focal pigmented lattice from 0430-0500 equator - OS: mild focal pigmented lattice from 0500-0600 - recommend laser retinopexy OU, OS first today, 05.04.22 - pt wishes to proceed with laser OS  - RBA of procedure discussed, questions answered  - informed consent obtained and signed - see procedure note - start PF QID OS x7 days - f/u 3 wks for POV OS and laser retinopexy OD  4,5. Hypertensive retinopathy OU - discussed importance of tight BP control - monitor  6. Nuclear Sclerosis OU - The symptoms of cataract, surgical options, and treatments and risks were discussed with patient. - discussed diagnosis and progression - not yet visually significant - monitor for now  Ophthalmic Meds Ordered this visit:  Meds ordered this encounter  Medications  . prednisoLONE acetate (PRED FORTE) 1 % ophthalmic suspension    Sig: Place 1 drop into the left eye 4 (four) times daily for 7 days.    Dispense:  10 mL    Refill:  0      Return in about 3 weeks (around 10/07/2020) for f/u lattice degeneration OU, DFE, OCT.  There are no Patient Instructions on file for this visit.   Explained the diagnoses, plan, and follow up with the patient  and they expressed understanding.  Patient expressed understanding of the importance of proper follow up care.   This document serves as a record of services personally performed by Gardiner Sleeper, MD, PhD. It was created on their behalf by Roselee Nova, COMT. The creation of this record is the provider's dictation and/or activities during the visit.  Electronically signed by: Roselee Nova, COMT 09/16/20 12:40 PM  This document serves as a record of services personally performed by Gardiner Sleeper, MD, PhD. It was created on their behalf by San Jetty. Owens Shark, OA an ophthalmic technician. The creation of this record is the provider's dictation and/or activities during the visit.    Electronically signed by: San Jetty. [place], [place] 05.04.2022 12:40 PM  Gardiner Sleeper, M.D., Ph.D. Diseases &  Surgery of the Retina and Vitreous [facility]  I have reviewed the above documentation for accuracy and completeness, and I agree with the above. Gardiner Sleeper, M.D., Ph.D. 09/16/20 12:40 PM   Abbreviations: M myopia (nearsighted); A astigmatism; H hyperopia (farsighted); P presbyopia; Mrx spectacle prescription;  CTL contact lenses; OD right eye; OS left eye; OU both eyes  XT exotropia; ET esotropia; PEK punctate epithelial keratitis; PEE punctate epithelial erosions; DES dry eye syndrome; MGD meibomian gland dysfunction; ATs artificial tears; PFAT's preservative free artificial tears; [place] nuclear sclerotic cataract; PSC posterior subcapsular cataract; ERM epi-retinal membrane; PVD posterior vitreous detachment; RD retinal detachment; DM diabetes mellitus; DR diabetic retinopathy; NPDR non-proliferative diabetic retinopathy; PDR proliferative diabetic retinopathy; CSME clinically significant macular edema; DME diabetic macular edema; dbh dot blot hemorrhages; CWS cotton wool spot; POAG primary open angle glaucoma; C/D cup-to-disc ratio; HVF humphrey visual field; GVF goldmann visual field;  OCT optical coherence tomography; IOP intraocular pressure; BRVO Branch retinal vein occlusion; CRVO central retinal vein occlusion; CRAO central retinal artery occlusion; BRAO branch retinal artery occlusion; RT retinal tear; SB scleral buckle; PPV pars plana vitrectomy; VH Vitreous hemorrhage; PRP panretinal laser photocoagulation; IVK intravitreal kenalog; VMT vitreomacular traction; MH Macular hole;  NVD neovascularization of the disc; NVE neovascularization elsewhere; AREDS age related eye disease study; ARMD age related macular degeneration; POAG primary open angle glaucoma; EBMD epithelial/anterior basement membrane dystrophy; ACIOL anterior chamber intraocular lens; IOL intraocular lens; PCIOL posterior chamber intraocular lens; Phaco/IOL phacoemulsification with intraocular lens placement; Gravity photorefractive keratectomy; LASIK laser assisted in situ keratomileusis; HTN hypertension; DM diabetes mellitus; COPD chronic obstructive pulmonary disease

## 2020-09-16 ENCOUNTER — Encounter (INDEPENDENT_AMBULATORY_CARE_PROVIDER_SITE_OTHER): Payer: Self-pay | Admitting: Ophthalmology

## 2020-09-16 ENCOUNTER — Ambulatory Visit (INDEPENDENT_AMBULATORY_CARE_PROVIDER_SITE_OTHER): Payer: Medicare Other | Admitting: Ophthalmology

## 2020-09-16 DIAGNOSIS — I10 Essential (primary) hypertension: Secondary | ICD-10-CM

## 2020-09-16 DIAGNOSIS — H35413 Lattice degeneration of retina, bilateral: Secondary | ICD-10-CM

## 2020-09-16 DIAGNOSIS — H35033 Hypertensive retinopathy, bilateral: Secondary | ICD-10-CM | POA: Diagnosis not present

## 2020-09-16 DIAGNOSIS — H3581 Retinal edema: Secondary | ICD-10-CM

## 2020-09-16 DIAGNOSIS — H33323 Round hole, bilateral: Secondary | ICD-10-CM

## 2020-09-16 DIAGNOSIS — H2513 Age-related nuclear cataract, bilateral: Secondary | ICD-10-CM

## 2020-09-16 LAB — LIPID PANEL WITH LDL/HDL RATIO
Cholesterol, Total: 213 mg/dL — ABNORMAL HIGH (ref 100–199)
HDL: 78 mg/dL (ref >39)
LDL Chol Calc (NIH): 123 mg/dL — ABNORMAL HIGH (ref 0–99)
LDL/HDL Ratio: 1.6 ratio (ref 0.0–3.2)
Triglycerides: 67 mg/dL (ref 0–149)
VLDL Cholesterol Cal: 12 mg/dL (ref 5–40)

## 2020-09-16 LAB — FERRITIN: Ferritin: 106 ng/mL (ref 15–150)

## 2020-09-16 LAB — COMPREHENSIVE METABOLIC PANEL
ALT: 16 IU/L (ref 0–32)
AST: 25 IU/L (ref 0–40)
Albumin/Globulin Ratio: 1.2 (ref 1.2–2.2)
Albumin: 4.2 g/dL (ref 3.8–4.8)
Alkaline Phosphatase: 150 IU/L — ABNORMAL HIGH (ref 44–121)
BUN/Creatinine Ratio: 12 (ref 9–23)
BUN: 29 mg/dL — ABNORMAL HIGH (ref 6–24)
Bilirubin Total: 0.4 mg/dL (ref 0.0–1.2)
CO2: 25 mmol/L (ref 20–29)
Calcium: 9.4 mg/dL (ref 8.7–10.2)
Chloride: 104 mmol/L (ref 96–106)
Creatinine, Ser: 2.33 mg/dL — ABNORMAL HIGH (ref 0.57–1.00)
Globulin, Total: 3.6 g/dL (ref 1.5–4.5)
Glucose: 83 mg/dL (ref 65–99)
Potassium: 4.2 mmol/L (ref 3.5–5.2)
Sodium: 144 mmol/L (ref 134–144)
Total Protein: 7.8 g/dL (ref 6.0–8.5)
eGFR: 25 mL/min/{1.73_m2} — ABNORMAL LOW (ref >59)

## 2020-09-16 LAB — CBC WITH DIFFERENTIAL
Basophils Absolute: 0 10*3/uL (ref 0.0–0.2)
Basos: 1 %
EOS (ABSOLUTE): 0.2 10*3/uL (ref 0.0–0.4)
Eos: 4 %
Hematocrit: 40 % (ref 34.0–46.6)
Hemoglobin: 13.2 g/dL (ref 11.1–15.9)
Immature Grans (Abs): 0 10*3/uL (ref 0.0–0.1)
Immature Granulocytes: 0 %
Lymphocytes Absolute: 1.3 10*3/uL (ref 0.7–3.1)
Lymphs: 29 %
MCH: 27.3 pg (ref 26.6–33.0)
MCHC: 33 g/dL (ref 31.5–35.7)
MCV: 83 fL (ref 79–97)
Monocytes Absolute: 0.4 10*3/uL (ref 0.1–0.9)
Monocytes: 10 %
Neutrophils Absolute: 2.5 10*3/uL (ref 1.4–7.0)
Neutrophils: 56 %
RBC: 4.83 x10E6/uL (ref 3.77–5.28)
RDW: 14.5 % (ref 11.7–15.4)
WBC: 4.5 10*3/uL (ref 3.4–10.8)

## 2020-09-16 LAB — FOLATE: Folate: 6.8 ng/mL (ref >3.0)

## 2020-09-16 LAB — IRON AND TIBC
Iron Saturation: 25 % (ref 15–55)
Iron: 78 ug/dL (ref 27–159)
Total Iron Binding Capacity: 310 ug/dL (ref 250–450)
UIBC: 232 ug/dL (ref 131–425)

## 2020-09-16 LAB — TSH: TSH: 4.54 u[IU]/mL — ABNORMAL HIGH (ref 0.450–4.500)

## 2020-09-16 LAB — VITAMIN D 25 HYDROXY (VIT D DEFICIENCY, FRACTURES): Vit D, 25-Hydroxy: 12 ng/mL — ABNORMAL LOW (ref 30.0–100.0)

## 2020-09-16 LAB — HEMOGLOBIN A1C
Est. average glucose Bld gHb Est-mCnc: 103 mg/dL
Hgb A1c MFr Bld: 5.2 % (ref 4.8–5.6)

## 2020-09-16 LAB — T4: T4, Total: 7.3 ug/dL (ref 4.5–12.0)

## 2020-09-16 LAB — INSULIN, RANDOM: INSULIN: 8.8 u[IU]/mL (ref 2.6–24.9)

## 2020-09-16 LAB — T3: T3, Total: 100 ng/dL (ref 71–180)

## 2020-09-16 LAB — PARATHYROID HORMONE, INTACT (NO CA): PTH: 104 pg/mL — ABNORMAL HIGH (ref 15–65)

## 2020-09-16 LAB — VITAMIN B12: Vitamin B-12: 369 pg/mL (ref 232–1245)

## 2020-09-16 MED ORDER — PREDNISOLONE ACETATE 1 % OP SUSP: 1.0000 [drp] | Freq: Four times a day (QID) | OPHTHALMIC | 0 refills | Status: AC

## 2020-09-16 NOTE — Progress Notes (Signed)
Chief Complaint:   OBESITY Alice [name] (MR# [id number]) is a 50 y.o. female who presents for evaluation and treatment of obesity and related comorbidities. Current BMI is Body mass index is 43.94 kg/m. Alice [name] has been struggling with her weight for many years and has been unsuccessful in either losing weight, maintaining weight loss, or reaching her healthy weight goal.  Alice [name] is status post gastric bypass approximately 10 years ago. She started at 450 lbs and went down to 319 lbs in 5 months. She is uncertain if she had dumping syndrome, but she often has GI upset and fluctuating glucose. She had multiple deep vein thrombosis and pulmonary embolism after surgery.  Alice [name] is currently in the action stage of change and ready to dedicate time achieving and maintaining a healthier weight. Alice [name] is interested in becoming our patient and working on intensive lifestyle modifications including (but not limited to) diet and exercise for weight loss.  Alice [name]'s habits were reviewed today and are as follows: her desired weight loss is 89 lbs, she has been heavy most of her life, she started gaining weight when she was younger, her heaviest weight ever was 450 pounds, she has significant food cravings issues, she snacks frequently in the evenings, she skips meals frequently, she frequently makes poor food choices and she struggles with emotional eating.  Depression Screen Alice [name]'s Food and Mood (modified PHQ-9) score was 27.  Depression screen PHQ 2/9 09/15/2020  Decreased Interest 3  Down, Depressed, Hopeless 2  PHQ - 2 Score 5  Altered sleeping 0  Tired, decreased energy 0  Change in appetite 3  Feeling bad or failure about yourself  0  Trouble concentrating 0  Moving slowly or fidgety/restless 0  Suicidal thoughts 0  PHQ-9 Score 8  Difficult doing work/chores Somewhat difficult  Some recent data might be hidden   Subjective:   1. Other fatigue Alice [name] admits to daytime somnolence and admits  to waking up still tired. Patent has a history of symptoms of daytime fatigue. Alice [name] generally gets different times hours of sleep per night, and states that she has generally restful sleep. Snoring (unknown) present. Apneic episodes are not present. Epworth Sleepiness Score is 9.  2. SOB (shortness of breath) on exertion Alice [name] notes increasing shortness of breath with exercising and seems to be worsening over time with weight gain. She notes getting out of breath sooner with activity than she used to. This has not gotten worse recently. Alice [name] denies shortness of breath at rest or orthopnea.  3. Anemia due to stage 3b chronic kidney disease (Alice [name]) Alice [name] has a history of gastric bypass, which often leads to B12 and Vit D deficiency, but she also has chronic kidney disease which causes anemia.  4. Stage 3b chronic kidney disease (Alice [name]) Alice [name] has a history of hypertension and obstructive sleep apnea. She is working on weight loss to help delay worsening of kidney function.  5. Vitamin D deficiency Alice [name] has a history of Vit D deficiency, and she has no recent labs.  6. Hyperglycemia Alice [name] does not have a history of diabetes mellitus, but she has a history of both low and high glucose readings and fatigue.  Assessment/Plan:   1. Other fatigue Alice [name] does feel that her weight is causing her energy to be lower than it should be. Fatigue may be related to obesity, depression or many other causes. Labs will be ordered, and in the meanwhile, Alice [name] will focus on self care including making healthy food  choices, increasing physical activity and focusing on stress reduction.  - TSH - Lipid Panel With LDL/HDL Ratio - T3 - T4 - EKG 12-Lead - Parathyroid hormone, intact (no Ca)  2. SOB (shortness of breath) on exertion Alice [name] does feel that she gets out of breath more easily that she used to when she exercises. Alice [name]'s shortness of breath appears to be obesity related and exercise induced. She has agreed to work on  weight loss and gradually increase exercise to treat her exercise induced shortness of breath. Will continue to monitor closely.  3. Anemia due to stage 3b chronic kidney disease (HCC) We will check labs today. Alice [name] will start on her iron and B12 rich diet, and will follow up as directed. Orders and follow up as documented in patient record.  - Vitamin B12 - CBC With Differential - Folate - Iron and TIBC - Ferritin  4. Stage 3b chronic kidney disease (Alice [name]) We will check labs today. Alice [name] will start on her eating plan, and will follow up as directed.  - Comprehensive metabolic panel  5. Vitamin D deficiency Low Vitamin D level contributes to fatigue and are associated with obesity, breast, and colon cancer. We will check labs today. Alice [name] will follow-up for routine testing of Vitamin D, at least 2-3 times per year to avoid over-replacement.  - VITAMIN D 25 Hydroxy (Vit-D Deficiency, Fractures)  6. Hyperglycemia Fasting labs will be obtained and results with be discussed with Alice [name] in 2 weeks at her follow up visit. In the meanwhile Alice [name] will start her Category 1 plan and will work on weight loss efforts.  - Insulin, random - Hemoglobin A1c  7. Screening for depression Alice [name] had a positive depression screening. Depression is commonly associated with obesity and often results in emotional eating behaviors. We will monitor this closely and work on CBT to help improve the non-hunger eating patterns. Referral to Psychology may be required if no improvement is seen as she continues in our clinic.  8. Class 3 severe obesity with serious comorbidity and body mass index (BMI) of 40.0 to 44.9 in adult, unspecified obesity type (HCC) Alice [name] is currently in the action stage of change and her goal is to continue with weight loss efforts. I recommend Alice [name] begin the structured treatment plan as follows:  She has agreed to the Category 1 Plan.  We will do a work up and plan to refer back to [facility] to be  evaluated for revisional surgery.   Exercise goals: No exercise has been prescribed for now, while we concentrate on nutritional changes.  Behavioral modification strategies: increasing lean protein intake and no skipping meals.  She was informed of the importance of frequent follow-up visits to maximize her success with intensive lifestyle modifications for her multiple health conditions. She was informed we would discuss her lab results at her next visit unless there is a critical issue that needs to be addressed sooner. Alice [name] agreed to keep her next visit at the agreed upon time to discuss these results.  Objective:   Blood pressure 131/84, pulse 72, temperature 97.9 F (36.6 C), height '5\' 8"'$  (1.727 m), weight 289 lb (131.1 kg), SpO2 100 %. Body mass index is 43.94 kg/m.  EKG: Normal sinus rhythm, rate 73 BPM.  Indirect Calorimeter completed today shows a VO2 of 213 and a REE of 1486.  Her calculated basal metabolic rate is [id number] thus her basal metabolic rate is worse than expected.  General: Cooperative, alert, well developed, in no acute distress.  HEENT: Conjunctivae and lids unremarkable. Cardiovascular: Regular rhythm.  Lungs: Normal work of breathing. Neurologic: No focal deficits.   Lab Results  Component Value Date   CREATININE 2.33 (H) 09/15/2020   BUN 29 (H) 09/15/2020   NA 144 09/15/2020   K 4.2 09/15/2020   CL 104 09/15/2020   CO2 25 09/15/2020   Lab Results  Component Value Date   ALT 16 09/15/2020   AST 25 09/15/2020   ALKPHOS 150 (H) 09/15/2020   BILITOT 0.4 09/15/2020   Lab Results  Component Value Date   HGBA1C 5.2 09/15/2020   Lab Results  Component Value Date   INSULIN 8.8 09/15/2020   Lab Results  Component Value Date   TSH 4.540 (H) 09/15/2020   Lab Results  Component Value Date   CHOL 213 (H) 09/15/2020   HDL 78 09/15/2020   LDLCALC 123 (H) 09/15/2020   TRIG 67 09/15/2020   CHOLHDL 2.9 Ratio 09/14/2007   Lab Results  Component Value  Date   WBC 4.5 09/15/2020   HGB 13.2 09/15/2020   HCT 40.0 09/15/2020   MCV 83 09/15/2020   PLT 236 08/19/2019   Lab Results  Component Value Date   IRON 78 09/15/2020   TIBC 310 09/15/2020   FERRITIN 106 09/15/2020   Obesity Behavioral Intervention:   Approximately 15 minutes were spent on the discussion below.  ASK: We discussed the diagnosis of obesity with Alice [name] today and Alice [name] agreed to give [place] permission to discuss obesity behavioral modification therapy today.  ASSESS: Alice [name] has the diagnosis of obesity and her BMI today is 43.95. Alice [name] is in the action stage of change.   ADVISE: Alice [name] was educated on the multiple health risks of obesity as well as the benefit of weight loss to improve her health. She was advised of the need for long term treatment and the importance of lifestyle modifications to improve her current health and to decrease her risk of future health problems.  AGREE: Multiple dietary modification options and treatment options were discussed and Emilynn agreed to follow the recommendations documented in the above note.  ARRANGE: Tiegan was educated on the importance of frequent visits to treat obesity as outlined per CMS and USPSTF guidelines and agreed to schedule her next follow up appointment today.  Attestation Statements:   Reviewed by clinician on day of visit: allergies, medications, problem list, medical history, surgical history, family history, social history, and previous encounter notes.   I, Trixie Dredge, am acting as transcriptionist for Dennard Nip, MD.  I have reviewed the above documentation for accuracy and completeness, and I agree with the above. - Dennard Nip, MD

## 2020-09-21 ENCOUNTER — Ambulatory Visit: Payer: Medicare Other | Admitting: Orthopedic Surgery

## 2020-09-29 ENCOUNTER — Ambulatory Visit (INDEPENDENT_AMBULATORY_CARE_PROVIDER_SITE_OTHER): Payer: Medicare Other | Admitting: Family Medicine

## 2020-10-02 NOTE — Progress Notes (Signed)
[facility] Note  10/07/2020     CHIEF COMPLAINT Patient presents for Retina Follow Up   HISTORY OF PRESENT ILLNESS: Alice [name] is a 50 y.o. female who presents to the clinic today for:   HPI    Retina Follow Up    Patient presents with  Other.  In both eyes.  Duration of 3 weeks.  Since onset it is stable.  I, the attending physician,  performed the HPI with the patient and updated documentation appropriately.          Comments    3 week follow up lattice degeneration OU- Patient is doing well.  Denies new floaters, FOLs, or decreased vision. Patient states she has finished her eye drops OS.        Last edited by Bernarda Caffey, MD on 10/07/2020 12:30 PM. (History)      Referring physician: Benito Mccreedy, MD [id number] [place],  [place] [address]  HISTORICAL INFORMATION:   Selected notes from the MEDICAL RECORD NUMBER Referred by Dr. Katy Fitch for questionable inferior retinal hole   CURRENT MEDICATIONS: No current outpatient medications on file. (Ophthalmic Drugs)   No current facility-administered medications for this visit. (Ophthalmic Drugs)   Current Outpatient Medications (Other)  Medication Sig  . acetaminophen (TYLENOL) 325 MG tablet Take 1-2 tablets (325-650 mg total) by mouth every 4 (four) hours as needed for mild pain.  Marland Kitchen albuterol (PROVENTIL HFA;VENTOLIN HFA) 108 (90 Base) MCG/ACT inhaler Inhale 2 puffs into the lungs every 6 (six) hours as needed for wheezing or shortness of breath.   Marland Kitchen amLODipine (NORVASC) 10 MG tablet Take 1 tablet (10 mg total) by mouth daily.  Marland Kitchen buPROPion (WELLBUTRIN XL) 300 MG 24 hr tablet Take 300 mg by mouth daily.  Marland Kitchen ELIQUIS 2.5 MG TABS tablet TAKE 1 TABLET(2.5 MG) BY MOUTH TWICE DAILY. FOLLOW-UP AS NEEDED FOR MORE REFILLS  . ferrous sulfate 325 (65 FE) MG tablet Take 1 tablet (325 mg total) by mouth daily with breakfast.  . furosemide (LASIX) 20 MG tablet Take 1 tablet (20 mg  total) by mouth daily.  . hydrochlorothiazide (HYDRODIURIL) 25 MG tablet Take 25 mg by mouth daily.  . nitroGLYCERIN (NITROSTAT) 0.4 MG SL tablet Place 1 tablet (0.4 mg total) under the tongue every 5 (five) minutes x 3 doses as needed for chest pain.  . pantoprazole (PROTONIX) 40 MG tablet Take 1 tablet (40 mg total) by mouth daily. For acid reflux  . potassium chloride SA (KLOR-CON) 20 MEQ tablet Take 1 tablet (20 mEq total) by mouth daily.  . pregabalin (LYRICA) 200 MG capsule Take 200 mg by mouth 2 (two) times a day.  Marland Kitchen tiZANidine (ZANAFLEX) 4 MG tablet Take 4 mg by mouth every 6 (six) hours as needed for muscle spasms.  . hydrochlorothiazide (HYDRODIURIL) 25 MG tablet Take 1 tablet (25 mg total) by mouth daily.   No current facility-administered medications for this visit. (Other)      REVIEW OF SYSTEMS: ROS    Positive for: Gastrointestinal, Genitourinary, Eyes, Respiratory, Psychiatric, Allergic/Imm   Negative for: Constitutional, Neurological, Skin, Musculoskeletal, HENT, Endocrine, Cardiovascular, Heme/Lymph   Last edited by Leonie Douglas, COA on 10/07/2020 10:07 AM. (History)       ALLERGIES Allergies  Allergen Reactions  . Vicodin [Hydrocodone-Acetaminophen] Nausea And Vomiting    PAST MEDICAL HISTORY Past Medical History:  Diagnosis Date  . Anemia   . Anxiety   . Arthritis   .  Chest pain   . Chronic bronchitis ([place])   . Chronic upper back pain   . Depression   . Depression   . DVT (deep venous thrombosis) (HCC)    BLE  . GERD (gastroesophageal reflux disease)   . Headache    "weekly" (04/16/2015)  . Hyperlipidemia   . Hypertension   . Joint pain   . Migraine    "monthly" (04/16/2015)  . Morbid obesity with BMI of 50.0-59.9, adult ([place])   . Obesity   . Other fatigue   . Pneumonia 2014  . Pulmonary embolism ([place])   . Sinusitis nasal   . Sleep apnea    "I'm suppose to wear a mask but I don't" (04/16/2015)  . SOB (shortness of breath) on exertion   .  Varicose veins of right lower extremity   . Venous stasis of lower extremity    right  . Wears dentures   . Wears glasses    Past Surgical History:  Procedure Laterality Date  . CHOLECYSTECTOMY N/A 04/18/2015   Procedure: LAPAROSCOPIC CHOLECYSTECTOMY;  Surgeon: Ralene Ok, MD;  Location: [facility];  Service: General;  Laterality: N/A;  . HYSTEROSCOPY WITH D & C  12/24/2001   Archie Endo 09/28/2010  . I & D EXTREMITY Right 07/25/2016   Procedure: IRRIGATION AND DEBRIDEMENT RIGHT LEG ULCER, APPLY VERAFLO VAC;  Surgeon: [name], MD;  Location: [facility];  Service: Orthopedics;  Laterality: Right;  . I & D EXTREMITY Right 09/28/2018   Procedure: DEBRIDEMENT RIGHT LOWER LEG WITH PLACEMENT WITH PLACEMENT OF SKIN GRAFT AND VAC;  Surgeon: [name], MD;  Location: [facility];  Service: Orthopedics;  Laterality: Right;  . I & D EXTREMITY Right 10/03/2018   Procedure: REPEAT IRRIGATION AND DEBRIDEMENT RIGHT LOWER LEG, VAC PLACEMENT;  Surgeon: [name], MD;  Location: [facility];  Service: Orthopedics;  Laterality: Right;  . INCISE AND DRAIN ABCESS Right 07/14/2016  . INCISION AND DRAINAGE Right 09/10/2008   leg:  skin and soft tissue and muscle/notes 09/15/2010  . INCISION AND DRAINAGE Right 01/01/2008   Chronic venous stasis insufficiency ulcer,/notes 09/14/2010/  . INCISION AND DRAINAGE Right [id number]   calf w/application wound vac/notes 07/20/2010  . INCISION AND DRAINAGE Right 07/25/2016   IRRIGATION AND DEBRIDEMENT RIGHT LEG ULCER,  . LAPAROSCOPIC GASTRIC BYPASS  ~ 2007  . MULTIPLE TOOTH EXTRACTIONS    . SKIN GRAFT SPLIT THICKNESS LEG / FOOT Right 07/25/2016   LEG  . SKIN SPLIT GRAFT Right 07/27/2016   Procedure: SKIN GRAFT RIGHT LEG WITH THERASKIN APPLICATION;  Surgeon: [name], MD;  Location: [facility];  Service: Orthopedics;  Laterality: Right;  . TONSILLECTOMY      FAMILY HISTORY Family History  Problem Relation Age of Onset  . Kidney disease Mother        kidney transplant  . Diabetes Mother    . Hyperlipidemia Mother   . Hypertension Mother   . Stroke Mother   . Diabetes Father   . Hypertension Father   . Hyperlipidemia Father   . Stroke Father   . Kidney disease Father   . Schizophrenia Father   . Alcoholism Father     SOCIAL HISTORY Social History   Tobacco Use  . Smoking status: Never Smoker  . Smokeless tobacco: Never Used  Vaping Use  . Vaping Use: Never used  Substance Use Topics  . Alcohol use: No  . Drug use: No         OPHTHALMIC EXAM:  Base Eye Exam    Visual Acuity (Snellen - Linear)      Right Left   Dist cc 20/25 -2 20/30 +1   Dist ph cc NI NI   Correction: Glasses       Tonometry (Tonopen, 10:12 AM)      Right Left   Pressure 18 15       Pupils      Dark Light Shape React APD   Right 3 2 Round Brisk None   Left 3 2 Round Brisk None       Visual Fields (Counting fingers)      Left Right    Full Full       Extraocular Movement      Right Left    Full Full       Neuro/Psych    Oriented x3: Yes   Mood/Affect: Normal       Dilation    Both eyes: 1.0% Mydriacyl, 2.5% Phenylephrine @ 10:12 AM        Slit Lamp and Fundus Exam    Slit Lamp Exam      Right Left   Lids/Lashes Normal Normal   Conjunctiva/Sclera mild melanosis mild melanosis   Cornea 1+ Punctate epithelial erosions, mild tear film debris 1+ Punctate epithelial erosions, mild tear film debris   Anterior Chamber Deep and quiet Deep and quiet   Iris Round and dilated Round and dilated   Lens Trace Cortical cataract Trace Cortical cataract   Vitreous Vitreous syneresis Vitreous syneresis       Fundus Exam      Right Left   Disc Pink and Sharp, mild PPA Pink and Sharp, Thin superior rim   C/D Ratio 0.5 0.6   Macula Flat, Good foveal reflex, RPE mottling, No heme or edema Flat, Good foveal reflex, RPE mottling, No heme or edema   Vessels mild attenuation mild attenuation   Periphery Attached, focal pigmented lattice from 0600-0630 equator, White without  pressure temporally Attached, mild White without pressure temporally, mild focal pigmented lattice from 0500-0600 equator        Refraction    Wearing Rx      Sphere Cylinder Axis   Right -6.00 +1.00 098   Left -6.00 Sphere           IMAGING AND PROCEDURES  Imaging and Procedures for 10/07/2020  OCT, Retina - OU - Both Eyes       Right Eye Quality was good. Central Foveal Thickness: 236. Progression has been stable. Findings include normal foveal contour, no IRF, no SRF, myopic contour.   Left Eye Quality was good. Central Foveal Thickness: 240. Progression has been stable. Findings include normal foveal contour, no IRF, no SRF, myopic contour.   Notes *Images captured and stored on drive  Diagnosis / Impression:  NFP; no IRF/SRF OU  Clinical management:  See below  Abbreviations: NFP - Normal foveal profile. CME - cystoid macular edema. PED - pigment epithelial detachment. IRF - intraretinal fluid. SRF - subretinal fluid. EZ - ellipsoid zone. ERM - epiretinal membrane. ORA - outer retinal atrophy. ORT - outer retinal tubulation. SRHM - subretinal hyper-reflective material. IRHM - intraretinal hyper-reflective material        Repair Retinal Breaks, Laser - OD - Right Eye       Time Out Confirmed correct patient, procedure, site, and patient consented.   Anesthesia Topical anesthesia was used. Anesthetic medications included Proparacaine 0.5%.   Post-op The patient tolerated the procedure well. There  were no complications. The patient received written and verbal post procedure care education.   Notes LASER PROCEDURE NOTE  Procedure:  Barrier laser retinopexy using slit lamp laser, RIGHT eye   Diagnosis:   Lattice degeneration w/ atrophic holes, RIGHT eye                     Patches of lattice: [id number] inferiorly  Surgeon: Bernarda Caffey, MD, PhD  Anesthesia: Topical  Informed consent obtained, operative eye marked, and time out performed prior to  initiation of laser.   Laser settings:  Lumenis [id number] laser, slit lamp Lens: Mainster PRP 165 Power: 240 mW Spot size: 200 microns Duration: 30 msec  # spots: 135  Placement of laser: Using a Mainster PRP 165 contact lens at the slit lamp, laser was placed in three confluent rows around patches of lattice w/ atrophic holes from 6-630 oclock equator  Complications: None.  Patient tolerated the procedure well and received written and verbal post-procedure care information/education.               ASSESSMENT/PLAN:    ICD-10-CM   1. Bilateral retinal lattice degeneration  H35.413 Repair Retinal Breaks, Laser - OD - Right Eye  2. Retinal hole of both eyes  H33.323 Repair Retinal Breaks, Laser - OD - Right Eye  3. Retinal edema  H35.81 OCT, Retina - OU - Both Eyes  4. Essential hypertension  I10   5. Hypertensive retinopathy of both eyes  H35.033   6. Nuclear sclerosis of both eyes  H25.13     1-3. Lattice degeneration w/ atrophic holes, both eyes - OD: focal pigmented lattice from 0600-0630 equator - OS: mild focal pigmented lattice from 0500-0600 - s/p laser retinopexy OS 05.04.22 - recommend laser retinopexy OD today, 5.25.22 - pt wishes to proceed with laser retinopexy OD  - RBA of procedure discussed, questions answered  - informed consent obtained and signed - see procedure note - start PF QID OD x7 days  4,5. Hypertensive retinopathy OU - discussed importance of tight BP control - monitor  6. Nuclear Sclerosis OU - The symptoms of cataract, surgical options, and treatments and risks were discussed with patient. - discussed diagnosis and progression - not yet visually significant - monitor for now  Ophthalmic Meds Ordered this visit:  No orders of the defined types were placed in this encounter.     Return for f/u 4-6 weeks, lattice degeneration OU.  There are no Patient Instructions on file for this visit.   Explained the diagnoses, plan, and  follow up with the patient and they expressed understanding.  Patient expressed understanding of the importance of proper follow up care.   This document serves as a record of services personally performed by Gardiner Sleeper, MD, PhD. It was created on their behalf by Roselee Nova, COMT. The creation of this record is the provider's dictation and/or activities during the visit.  Electronically signed by: Roselee Nova, COMT 10/07/20 12:45 PM  This document serves as a record of services personally performed by Gardiner Sleeper, MD, PhD. It was created on their behalf by Estill Bakes, COT an ophthalmic technician. The creation of this record is the provider's dictation and/or activities during the visit.    Electronically signed by: Estill Bakes, COT 5.25.22 @ 12:45 PM  Gardiner Sleeper, M.D., Ph.D. Diseases & Surgery of the Retina and [place] 5.25.22  I have reviewed the above documentation for accuracy and  completeness, and I agree with the above. Gardiner Sleeper, M.D., Ph.D. 10/07/20 12:45 PM   Abbreviations: M myopia (nearsighted); A astigmatism; H hyperopia (farsighted); P presbyopia; Mrx spectacle prescription;  CTL contact lenses; OD right eye; OS left eye; OU both eyes  XT exotropia; ET esotropia; PEK punctate epithelial keratitis; PEE punctate epithelial erosions; DES dry eye syndrome; MGD meibomian gland dysfunction; ATs artificial tears; PFAT's preservative free artificial tears; [place] nuclear sclerotic cataract; PSC posterior subcapsular cataract; ERM epi-retinal membrane; PVD posterior vitreous detachment; RD retinal detachment; DM diabetes mellitus; DR diabetic retinopathy; NPDR non-proliferative diabetic retinopathy; PDR proliferative diabetic retinopathy; CSME clinically significant macular edema; DME diabetic macular edema; dbh dot blot hemorrhages; CWS cotton wool spot; POAG primary open angle glaucoma; C/D cup-to-disc ratio; HVF humphrey visual  field; GVF goldmann visual field; OCT optical coherence tomography; IOP intraocular pressure; BRVO Branch retinal vein occlusion; CRVO central retinal vein occlusion; CRAO central retinal artery occlusion; BRAO branch retinal artery occlusion; RT retinal tear; SB scleral buckle; PPV pars plana vitrectomy; VH Vitreous hemorrhage; PRP panretinal laser photocoagulation; IVK intravitreal kenalog; VMT vitreomacular traction; MH Macular hole;  NVD neovascularization of the disc; NVE neovascularization elsewhere; AREDS age related eye disease study; ARMD age related macular degeneration; POAG primary open angle glaucoma; EBMD epithelial/anterior basement membrane dystrophy; ACIOL anterior chamber intraocular lens; IOL intraocular lens; PCIOL posterior chamber intraocular lens; Phaco/IOL phacoemulsification with intraocular lens placement; Sims photorefractive keratectomy; LASIK laser assisted in situ keratomileusis; HTN hypertension; DM diabetes mellitus; COPD chronic obstructive pulmonary disease

## 2020-10-07 ENCOUNTER — Encounter (INDEPENDENT_AMBULATORY_CARE_PROVIDER_SITE_OTHER): Payer: Self-pay | Admitting: Ophthalmology

## 2020-10-07 ENCOUNTER — Other Ambulatory Visit: Payer: Self-pay

## 2020-10-07 ENCOUNTER — Ambulatory Visit (INDEPENDENT_AMBULATORY_CARE_PROVIDER_SITE_OTHER): Payer: Medicare Other | Admitting: Ophthalmology

## 2020-10-07 DIAGNOSIS — H35413 Lattice degeneration of retina, bilateral: Secondary | ICD-10-CM

## 2020-10-07 DIAGNOSIS — H3581 Retinal edema: Secondary | ICD-10-CM

## 2020-10-07 DIAGNOSIS — H2513 Age-related nuclear cataract, bilateral: Secondary | ICD-10-CM

## 2020-10-07 DIAGNOSIS — I10 Essential (primary) hypertension: Secondary | ICD-10-CM

## 2020-10-07 DIAGNOSIS — H35033 Hypertensive retinopathy, bilateral: Secondary | ICD-10-CM

## 2020-10-07 DIAGNOSIS — H33323 Round hole, bilateral: Secondary | ICD-10-CM

## 2020-11-05 DIAGNOSIS — D508 Other iron deficiency anemias: Secondary | ICD-10-CM | POA: Diagnosis not present

## 2020-11-05 DIAGNOSIS — G4733 Obstructive sleep apnea (adult) (pediatric): Secondary | ICD-10-CM | POA: Diagnosis not present

## 2020-11-05 DIAGNOSIS — R1013 Epigastric pain: Secondary | ICD-10-CM | POA: Diagnosis not present

## 2020-11-05 DIAGNOSIS — R109 Unspecified abdominal pain: Secondary | ICD-10-CM | POA: Diagnosis not present

## 2020-11-05 DIAGNOSIS — I10 Essential (primary) hypertension: Secondary | ICD-10-CM | POA: Diagnosis not present

## 2020-11-05 DIAGNOSIS — F329 Major depressive disorder, single episode, unspecified: Secondary | ICD-10-CM | POA: Diagnosis not present

## 2020-11-05 DIAGNOSIS — E559 Vitamin D deficiency, unspecified: Secondary | ICD-10-CM | POA: Diagnosis not present

## 2020-11-05 DIAGNOSIS — N1831 Chronic kidney disease, stage 3a: Secondary | ICD-10-CM | POA: Diagnosis not present

## 2020-11-05 DIAGNOSIS — G629 Polyneuropathy, unspecified: Secondary | ICD-10-CM | POA: Diagnosis not present

## 2020-11-05 DIAGNOSIS — R7303 Prediabetes: Secondary | ICD-10-CM | POA: Diagnosis not present

## 2020-11-10 ENCOUNTER — Encounter (INDEPENDENT_AMBULATORY_CARE_PROVIDER_SITE_OTHER): Payer: Medicare Other | Admitting: Ophthalmology

## 2020-11-11 ENCOUNTER — Encounter (INDEPENDENT_AMBULATORY_CARE_PROVIDER_SITE_OTHER): Payer: Medicare Other | Admitting: Ophthalmology

## 2020-11-11 ENCOUNTER — Other Ambulatory Visit: Payer: Self-pay | Admitting: Physician Assistant

## 2020-11-11 NOTE — Progress Notes (Deleted)
Cardiology Office Note:    Date:  11/11/2020   ID:  Alice [name], DOB [birth year], MRN [id number]  PCP:  Benito Mccreedy, MD   [facility] Providers Cardiologist:  Ena Dawley, MD (Inactive) {   Referring MD: Benito Mccreedy, MD    History of Present Illness:    Alice [name] is a 50 y.o. 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 Meda [name] who now returns to clinic for follow-up.  Patient was last seen by Dr. Meda [name] in 02/2017. She had been admitted during that year with urosepsis with associated acute kidney injury.  Her troponin levels were minimally elevated and flat without clear trend.  This was felt to be related to demand ischemia.  She had an EF of 45%.  It was felt that this may be stress-induced cardiomyopathy.  An outpatient ischemic evaluation was to be pursued.  However, follow-up echocardiogram in 2/19 demonstrated normal LV function.  Therefore, no further testing was indicated.  She last saw Alice Dopp, PA-C where she was doing well with no significant CV complaints.  Today,  Past Medical History:  Diagnosis Date   Anemia    Anxiety    Arthritis    Chest pain    Chronic bronchitis (HCC)    Chronic upper back pain    Depression    Depression    DVT (deep venous thrombosis) (HCC)    BLE   GERD (gastroesophageal reflux disease)    Headache    "weekly" (04/16/2015)   Hyperlipidemia    Hypertension    Joint pain    Migraine    "monthly" (04/16/2015)   Morbid obesity with BMI of 50.0-59.9, adult ([place])    Obesity    Other fatigue    Pneumonia 2014   Pulmonary embolism (HCC)    Sinusitis nasal    Sleep apnea    "I'm suppose to wear a mask but I don't" (04/16/2015)   SOB (shortness of breath) on exertion    Varicose veins of right lower extremity    Venous stasis of lower extremity    right   Wears dentures    Wears glasses      Past Surgical History:  Procedure Laterality Date   CHOLECYSTECTOMY N/A 04/18/2015   Procedure: LAPAROSCOPIC CHOLECYSTECTOMY;  Surgeon: Ralene Ok, MD;  Location: [facility];  Service: General;  Laterality: N/A;   HYSTEROSCOPY WITH D & C  12/24/2001   Archie Endo 09/28/2010   I & D EXTREMITY Right 07/25/2016   Procedure: IRRIGATION AND DEBRIDEMENT RIGHT LEG ULCER, APPLY [place];  Surgeon: [name], MD;  Location: [facility];  Service: Orthopedics;  Laterality: Right;   I & D EXTREMITY Right 09/28/2018   Procedure: DEBRIDEMENT RIGHT LOWER LEG WITH PLACEMENT WITH PLACEMENT OF SKIN GRAFT AND VAC;  Surgeon: [name], MD;  Location: [facility];  Service: Orthopedics;  Laterality: Right;   I & D EXTREMITY Right 10/03/2018   Procedure: REPEAT IRRIGATION AND DEBRIDEMENT RIGHT LOWER LEG, VAC PLACEMENT;  Surgeon: [name], MD;  Location: [facility];  Service: Orthopedics;  Laterality: Right;   INCISE AND DRAIN ABCESS Right 07/14/2016   INCISION AND DRAINAGE Right 09/10/2008   leg:  skin and soft tissue and muscle/notes 09/15/2010   INCISION AND DRAINAGE Right 01/01/2008   Chronic venous stasis insufficiency ulcer,/notes 09/14/2010/   INCISION AND DRAINAGE Right 07/2010  calf w/application wound vac/notes 07/20/2010   INCISION AND DRAINAGE Right 07/25/2016   IRRIGATION AND DEBRIDEMENT RIGHT LEG ULCER,   LAPAROSCOPIC GASTRIC BYPASS  ~ 2007   MULTIPLE TOOTH EXTRACTIONS     SKIN GRAFT SPLIT THICKNESS LEG / FOOT Right 07/25/2016   LEG   SKIN SPLIT GRAFT Right 07/27/2016   Procedure: SKIN GRAFT RIGHT LEG WITH THERASKIN APPLICATION;  Surgeon: [name], MD;  Location: [facility];  Service: Orthopedics;  Laterality: Right;   TONSILLECTOMY      Current Medications: No outpatient medications have been marked as taking for the 11/13/20 encounter (Appointment) with Freada Bergeron, MD.     Allergies:   Vicodin [hydrocodone-acetaminophen]   Social History   Socioeconomic History   Marital status: Single     Spouse name: Not on file   Number of children: Not on file   Years of education: Not on file   Highest education level: Not on file  Occupational History   Occupation: unemployed  Tobacco Use   Smoking status: Never   Smokeless tobacco: Never  Vaping Use   Vaping Use: Never used  Substance and Sexual Activity   Alcohol use: No   Drug use: No   Sexual activity: Yes    Partners: Male    Birth control/protection: I.U.D.  Other Topics Concern   Not on file  Social History Narrative   Not on file   Social Determinants of Health   Financial Resource Strain: Not on file  Food Insecurity: Not on file  Transportation Needs: Not on file  Physical Activity: Not on file  Stress: Not on file  Social Connections: Not on file     Family History: The patient's ***family history includes Alcoholism in her father; Diabetes in her father and mother; Hyperlipidemia in her father and mother; Hypertension in her father and mother; Kidney disease in her father and mother; Schizophrenia in her father; Stroke in her father and mother.  ROS:   Please see the history of present illness.    *** All other systems reviewed and are negative.  EKGs/Labs/Other Studies Reviewed:    The following studies were reviewed today: Echocardiogram 06/27/2017 Mild focal basal septal hypertrophy, EF 50-55, normal wall motion, mild-moderate LAE, moderate RVE, moderate RAE   Echocardiogram 12/29/2016 EF 45-50, diffuse HK, mild LAE, moderate RAE, moderate RVE  EKG:  EKG is *** ordered today.  The ekg ordered today demonstrates ***  Recent Labs: 09/15/2020: ALT 16; BUN 29; Creatinine, Ser 2.33; Hemoglobin 13.2; Potassium 4.2; Sodium 144; TSH 4.540  Recent Lipid Panel    Component Value Date/Time   CHOL 213 (H) 09/15/2020 1103   TRIG 67 09/15/2020 1103   HDL 78 09/15/2020 1103   CHOLHDL 2.9 Ratio 09/14/2007 2219   VLDL 9 09/14/2007 2219   LDLCALC 123 (H) 09/15/2020 1103     Risk Assessment/Calculations:    {Does this patient have ATRIAL FIBRILLATION?[phone]       Physical Exam:    VS:  There were no vitals taken for this visit.    Wt Readings from Last 3 Encounters:  09/15/20 289 lb (131.1 kg)  11/27/19 (!) 308 lb 9.6 oz (140 kg)  09/10/19 300 lb (136.1 kg)     GEN: *** Well nourished, well developed in no acute distress HEENT: Normal NECK: No JVD; No carotid bruits LYMPHATICS: No lymphadenopathy CARDIAC: ***RRR, no murmurs, rubs, gallops RESPIRATORY:  Clear to auscultation without rales, wheezing or rhonchi  ABDOMEN: Soft, non-tender, non-distended MUSCULOSKELETAL:  No edema; No deformity  SKIN: Warm and dry NEUROLOGIC:  Alert and oriented x 3 PSYCHIATRIC:  Normal affect   ASSESSMENT:    No diagnosis found. 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 Currently without anginal or significant HF symptoms.   #History of DVT/PE: -Continue apixaban '5mg'$  BID  #CKD Stage IIIB: -Follow-up with primary care as scheduled  #HTN:  #History of PVCs:    {Are you ordering a CV Procedure (e.g. stress test, cath, DCCV, TEE, etc)?   Press F2        :[id number]    Medication Adjustments/Labs and Tests Ordered: Current medicines are reviewed at length with the patient today.  Concerns regarding medicines are outlined above.  No orders of the defined types were placed in this encounter.  No orders of the defined types were placed in this encounter.   There are no Patient Instructions on file for this visit.   Signed, Freada Bergeron, MD  11/11/2020 9:02 PM

## 2020-11-13 ENCOUNTER — Ambulatory Visit (HOSPITAL_BASED_OUTPATIENT_CLINIC_OR_DEPARTMENT_OTHER): Payer: Medicare Other | Admitting: Cardiology

## 2020-11-17 DIAGNOSIS — M545 Low back pain, unspecified: Secondary | ICD-10-CM | POA: Diagnosis not present

## 2020-11-17 DIAGNOSIS — M4316 Spondylolisthesis, lumbar region: Secondary | ICD-10-CM | POA: Diagnosis not present

## 2020-11-17 DIAGNOSIS — M47816 Spondylosis without myelopathy or radiculopathy, lumbar region: Secondary | ICD-10-CM | POA: Diagnosis not present

## 2020-11-20 ENCOUNTER — Encounter (INDEPENDENT_AMBULATORY_CARE_PROVIDER_SITE_OTHER): Payer: Medicare Other | Admitting: Ophthalmology

## 2020-11-24 NOTE — Progress Notes (Signed)
[facility] Note  11/25/2020     CHIEF COMPLAINT Patient presents for Retina Follow Up   HISTORY OF PRESENT ILLNESS: Alice [name] is a 50 y.o. female who presents to the clinic today for:   HPI     Retina Follow Up   Patient presents with  Other.  In both eyes.  Duration of 7 weeks.  Since onset it is stable.  I, the attending physician,  performed the HPI with the patient and updated documentation appropriately.        Comments   Pt here for 7 week ret f/u lattice degen. OU. Pt states vision is about the same, has new specs x 2 wks.       Last edited by Bernarda Caffey, MD on 11/25/2020 11:21 PM.       Referring physician: [facility] [address] [place],  [place] [phone]  HISTORICAL INFORMATION:   Selected notes from the MEDICAL RECORD NUMBER Referred by Dr. Katy Fitch for questionable inferior retinal hole   CURRENT MEDICATIONS: No current outpatient medications on file. (Ophthalmic Drugs)   No current facility-administered medications for this visit. (Ophthalmic Drugs)   Current Outpatient Medications (Other)  Medication Sig   acetaminophen (TYLENOL) 325 MG tablet Take 1-2 tablets (325-650 mg total) by mouth every 4 (four) hours as needed for mild pain.   albuterol (PROVENTIL HFA;VENTOLIN HFA) 108 (90 Base) MCG/ACT inhaler Inhale 2 puffs into the lungs every 6 (six) hours as needed for wheezing or shortness of breath.    amLODipine (NORVASC) 10 MG tablet Take 1 tablet (10 mg total) by mouth daily.   buPROPion (WELLBUTRIN XL) 300 MG 24 hr tablet Take 300 mg by mouth daily.   ELIQUIS 2.5 MG TABS tablet TAKE 1 TABLET(2.5 MG) BY MOUTH TWICE DAILY. FOLLOW-UP AS NEEDED FOR MORE REFILLS   ferrous sulfate 325 (65 FE) MG tablet Take 1 tablet (325 mg total) by mouth daily with breakfast.   furosemide (LASIX) 20 MG tablet Take 1 tablet (20 mg total) by mouth daily.   hydrochlorothiazide (HYDRODIURIL) 25 MG tablet Take 1  tablet (25 mg total) by mouth daily.   hydrochlorothiazide (HYDRODIURIL) 25 MG tablet Take 25 mg by mouth daily.   nitroGLYCERIN (NITROSTAT) 0.4 MG SL tablet Place 1 tablet (0.4 mg total) under the tongue every 5 (five) minutes x 3 doses as needed for chest pain.   pantoprazole (PROTONIX) 40 MG tablet Take 1 tablet (40 mg total) by mouth daily. For acid reflux   potassium chloride SA (KLOR-CON) 20 MEQ tablet TAKE 1 TABLET(20 MEQ) BY MOUTH DAILY   pregabalin (LYRICA) 200 MG capsule Take 200 mg by mouth 2 (two) times a day.   tiZANidine (ZANAFLEX) 4 MG tablet Take 4 mg by mouth every 6 (six) hours as needed for muscle spasms.   No current facility-administered medications for this visit. (Other)      REVIEW OF SYSTEMS: ROS   Positive for: Gastrointestinal, Genitourinary, Eyes, Respiratory, Psychiatric, Allergic/Imm Negative for: Constitutional, Neurological, Skin, Musculoskeletal, HENT, Endocrine, Cardiovascular, Heme/Lymph Last edited by Kingsley Spittle, COT on 11/25/2020  2:56 PM.        ALLERGIES Allergies  Allergen Reactions   Vicodin [Hydrocodone-Acetaminophen] Nausea And Vomiting    PAST MEDICAL HISTORY Past Medical History:  Diagnosis Date   Anemia    Anxiety    Arthritis    Chest pain    Chronic bronchitis (HCC)    Chronic upper  back pain    Depression    Depression    DVT (deep venous thrombosis) (HCC)    BLE   GERD (gastroesophageal reflux disease)    Headache    "weekly" (04/16/2015)   Hyperlipidemia    Hypertension    Joint pain    Migraine    "monthly" (04/16/2015)   Morbid obesity with BMI of 50.0-59.9, adult ([place])    Obesity    Other fatigue    Pneumonia 2014   Pulmonary embolism (HCC)    Sinusitis nasal    Sleep apnea    "I'm suppose to wear a mask but I don't" (04/16/2015)   SOB (shortness of breath) on exertion    Varicose veins of right lower extremity    Venous stasis of lower extremity    right   Wears dentures    Wears glasses     Past Surgical History:  Procedure Laterality Date   CHOLECYSTECTOMY N/A 04/18/2015   Procedure: LAPAROSCOPIC CHOLECYSTECTOMY;  Surgeon: Ralene Ok, MD;  Location: [place];  Service: General;  Laterality: N/A;   HYSTEROSCOPY WITH D & C  12/24/2001   Archie Endo 09/28/2010   I & D EXTREMITY Right 07/25/2016   Procedure: IRRIGATION AND DEBRIDEMENT RIGHT LEG ULCER, APPLY VERAFLO VAC;  Surgeon: [name], MD;  Location: [place];  Service: Orthopedics;  Laterality: Right;   I & D EXTREMITY Right 09/28/2018   Procedure: DEBRIDEMENT RIGHT LOWER LEG WITH PLACEMENT WITH PLACEMENT OF SKIN GRAFT AND VAC;  Surgeon: [name], MD;  Location: [place];  Service: Orthopedics;  Laterality: Right;   I & D EXTREMITY Right 10/03/2018   Procedure: REPEAT IRRIGATION AND DEBRIDEMENT RIGHT LOWER LEG, VAC PLACEMENT;  Surgeon: [name], MD;  Location: [place];  Service: Orthopedics;  Laterality: Right;   INCISE AND DRAIN ABCESS Right 07/14/2016   INCISION AND DRAINAGE Right 09/10/2008   leg:  skin and soft tissue and muscle/notes 09/15/2010   INCISION AND DRAINAGE Right 01/01/2008   Chronic venous stasis insufficiency ulcer,/notes 09/14/2010/   INCISION AND DRAINAGE Right 12/5883   calf w/application wound vac/notes 07/20/2010   INCISION AND DRAINAGE Right 07/25/2016   IRRIGATION AND DEBRIDEMENT RIGHT LEG ULCER,   LAPAROSCOPIC GASTRIC BYPASS  ~ 2007   MULTIPLE TOOTH EXTRACTIONS     SKIN GRAFT SPLIT THICKNESS LEG / FOOT Right 07/25/2016   LEG   SKIN SPLIT GRAFT Right 07/27/2016   Procedure: SKIN GRAFT RIGHT LEG WITH THERASKIN APPLICATION;  Surgeon: [name], MD;  Location: [place];  Service: Orthopedics;  Laterality: Right;   TONSILLECTOMY      FAMILY HISTORY Family History  Problem Relation Age of Onset   Kidney disease Mother        kidney transplant   Diabetes Mother    Hyperlipidemia Mother    Hypertension Mother    Stroke Mother    Diabetes Father    Hypertension Father    Hyperlipidemia Father     Stroke Father    Kidney disease Father    Schizophrenia Father    Alcoholism Father     SOCIAL HISTORY Social History   Tobacco Use   Smoking status: Never   Smokeless tobacco: Never  Vaping Use   Vaping Use: Never used  Substance Use Topics   Alcohol use: No   Drug use: No         OPHTHALMIC EXAM:  Base Eye Exam     Visual Acuity (Snellen - Linear)  Right Left   Dist cc 20/25 20/25   Dist ph cc 20/25 +1 20/20 -1    Correction: Glasses         Tonometry (Tonopen, 3:06 PM)       Right Left   Pressure 13 10         Pupils       Dark Light Shape React APD   Right 3 2 Round Brisk None   Left 3 2 Round Brisk None         Visual Fields (Counting fingers)       Left Right    Full Full         Neuro/Psych     Oriented x3: Yes   Mood/Affect: Normal         Dilation     Both eyes: 1.0% Mydriacyl, 2.5% Phenylephrine @ 3:06 PM           Slit Lamp and Fundus Exam     Slit Lamp Exam       Right Left   Lids/Lashes Normal Normal   Conjunctiva/Sclera mild melanosis mild melanosis   Cornea Trace Punctate epithelial erosions, mild tear film debris 1+ Punctate epithelial erosions, mild tear film debris   Anterior Chamber Deep and quiet Deep and quiet   Iris Round and dilated Round and dilated   Lens Trace Cortical cataract Trace Cortical cataract   Vitreous Vitreous syneresis Vitreous syneresis         Fundus Exam       Right Left   Disc Pink and Sharp, mild PPA Pink and Sharp, Thin superior rim   C/D Ratio 0.5 0.6   Macula Flat, Good foveal reflex, RPE mottling, No heme or edema Flat, Good foveal reflex, RPE mottling, No heme or edema   Vessels mild attenuation mild attenuation   Periphery Attached, focal pigmented lattice from 0600-0630 equator--good laser surrounding, White without pressure temporally, no new RT/RD or lattice Attached, mild White without pressure temporally, mild focal pigmented lattice from 0500-0600 equator,  good laser surrounding, no new RT/RD or Lattice.           Refraction     Wearing Rx       Sphere Cylinder Axis   Right -6.75 +0.75 081   Left -6.25 +1.25 132            IMAGING AND PROCEDURES  Imaging and Procedures for 11/25/2020  OCT, Retina - OU - Both Eyes       Right Eye Quality was good. Central Foveal Thickness: 237. Progression has been stable. Findings include normal foveal contour, no IRF, no SRF, myopic contour.   Left Eye Quality was good. Central Foveal Thickness: 244. Progression has been stable. Findings include normal foveal contour, no IRF, no SRF, myopic contour.   Notes *Images captured and stored on drive  Diagnosis / Impression:  NFP; no IRF/SRF OU  Clinical management:  See below  Abbreviations: NFP - Normal foveal profile. CME - cystoid macular edema. PED - pigment epithelial detachment. IRF - intraretinal fluid. SRF - subretinal fluid. EZ - ellipsoid zone. ERM - epiretinal membrane. ORA - outer retinal atrophy. ORT - outer retinal tubulation. SRHM - subretinal hyper-reflective material. IRHM - intraretinal hyper-reflective material             ASSESSMENT/PLAN:    ICD-10-CM   1. Bilateral retinal lattice degeneration  H35.413     2. Retinal hole of both eyes  H33.323 OCT, Retina - OU - Both  Eyes    3. Retinal edema  H35.81     4. Essential hypertension  I10     5. Hypertensive retinopathy of both eyes  H35.033     6. Nuclear sclerosis of both eyes  H25.13       1-3. Lattice degeneration w/ atrophic holes, both eyes - OD: focal pigmented lattice from 0600-0630 equator - OS: mild focal pigmented lattice from 0500-0600 - s/p laser retinopexy OS (05.04.22), OD (05.25.22) - good laser in place OU - no new RT/RD or lattice - pt is cleared from a retina standpoint for release to [facility] and resumption of primary eye care - can f/u here PRN  4,5. Hypertensive retinopathy OU - discussed importance of tight BP  control - monitor  6. Nuclear Sclerosis OU - The symptoms of cataract, surgical options, and treatments and risks were discussed with patient. - discussed diagnosis and progression - not yet visually significant - monitor for now  Ophthalmic Meds Ordered this visit:  No orders of the defined types were placed in this encounter.     Return if symptoms worsen or fail to improve.  There are no Patient Instructions on file for this visit.   Explained the diagnoses, plan, and follow up with the patient and they expressed understanding.  Patient expressed understanding of the importance of proper follow up care.   This document serves as a record of services personally performed by Gardiner Sleeper, MD, PhD. It was created on their behalf by Roselee Nova, COMT. The creation of this record is the provider's dictation and/or activities during the visit.  Electronically signed by: Roselee Nova, COMT 11/25/20 11:47 PM  This document serves as a record of services personally performed by Gardiner Sleeper, MD, PhD. It was created on their behalf by San Jetty. Owens Shark, OA an ophthalmic technician. The creation of this record is the provider's dictation and/or activities during the visit.    Electronically signed by: San Jetty. Quanesha, Klimaszewski 07.13.2022 11:47 PM  Gardiner Sleeper, M.D., Ph.D. Diseases & Surgery of the Retina and Vitreous [facility]  I have reviewed the above documentation for accuracy and completeness, and I agree with the above. Gardiner Sleeper, M.D., Ph.D. 11/25/20 11:47 PM   Abbreviations: M myopia (nearsighted); A astigmatism; H hyperopia (farsighted); P presbyopia; Mrx spectacle prescription;  CTL contact lenses; OD right eye; OS left eye; OU both eyes  XT exotropia; ET esotropia; PEK punctate epithelial keratitis; PEE punctate epithelial erosions; DES dry eye syndrome; MGD meibomian gland dysfunction; ATs artificial tears; PFAT's preservative free artificial  tears; Long nuclear sclerotic cataract; PSC posterior subcapsular cataract; ERM epi-retinal membrane; PVD posterior vitreous detachment; RD retinal detachment; DM diabetes mellitus; DR diabetic retinopathy; NPDR non-proliferative diabetic retinopathy; PDR proliferative diabetic retinopathy; CSME clinically significant macular edema; DME diabetic macular edema; dbh dot blot hemorrhages; CWS cotton wool spot; POAG primary open angle glaucoma; C/D cup-to-disc ratio; HVF humphrey visual field; GVF goldmann visual field; OCT optical coherence tomography; IOP intraocular pressure; BRVO Branch retinal vein occlusion; CRVO central retinal vein occlusion; CRAO central retinal artery occlusion; BRAO branch retinal artery occlusion; RT retinal tear; SB scleral buckle; PPV pars plana vitrectomy; VH Vitreous hemorrhage; PRP panretinal laser photocoagulation; IVK intravitreal kenalog; VMT vitreomacular traction; MH Macular hole;  NVD neovascularization of the disc; NVE neovascularization elsewhere; AREDS age related eye disease study; ARMD age related macular degeneration; POAG primary open angle glaucoma; EBMD epithelial/anterior basement membrane dystrophy; ACIOL anterior chamber  intraocular lens; IOL intraocular lens; PCIOL posterior chamber intraocular lens; Phaco/IOL phacoemulsification with intraocular lens placement; [place] photorefractive keratectomy; LASIK laser assisted in situ keratomileusis; HTN hypertension; DM diabetes mellitus; COPD chronic obstructive pulmonary disease

## 2020-11-25 ENCOUNTER — Ambulatory Visit (INDEPENDENT_AMBULATORY_CARE_PROVIDER_SITE_OTHER): Payer: Medicare Other | Admitting: Ophthalmology

## 2020-11-25 ENCOUNTER — Other Ambulatory Visit: Payer: Self-pay | Admitting: Rehabilitation

## 2020-11-25 ENCOUNTER — Encounter (INDEPENDENT_AMBULATORY_CARE_PROVIDER_SITE_OTHER): Payer: Self-pay | Admitting: Ophthalmology

## 2020-11-25 ENCOUNTER — Other Ambulatory Visit: Payer: Self-pay

## 2020-11-25 DIAGNOSIS — H3581 Retinal edema: Secondary | ICD-10-CM

## 2020-11-25 DIAGNOSIS — H2513 Age-related nuclear cataract, bilateral: Secondary | ICD-10-CM | POA: Diagnosis not present

## 2020-11-25 DIAGNOSIS — H33323 Round hole, bilateral: Secondary | ICD-10-CM | POA: Diagnosis not present

## 2020-11-25 DIAGNOSIS — H35413 Lattice degeneration of retina, bilateral: Secondary | ICD-10-CM

## 2020-11-25 DIAGNOSIS — H35033 Hypertensive retinopathy, bilateral: Secondary | ICD-10-CM | POA: Diagnosis not present

## 2020-11-25 DIAGNOSIS — M4316 Spondylolisthesis, lumbar region: Secondary | ICD-10-CM

## 2020-11-25 DIAGNOSIS — I10 Essential (primary) hypertension: Secondary | ICD-10-CM | POA: Diagnosis not present

## 2020-12-01 ENCOUNTER — Other Ambulatory Visit: Payer: Self-pay

## 2020-12-01 ENCOUNTER — Ambulatory Visit: Payer: Medicare Other | Attending: *Deleted

## 2020-12-01 DIAGNOSIS — G8929 Other chronic pain: Secondary | ICD-10-CM | POA: Diagnosis not present

## 2020-12-01 DIAGNOSIS — R262 Difficulty in walking, not elsewhere classified: Secondary | ICD-10-CM | POA: Diagnosis not present

## 2020-12-01 DIAGNOSIS — M5442 Lumbago with sciatica, left side: Secondary | ICD-10-CM | POA: Diagnosis not present

## 2020-12-01 DIAGNOSIS — M6281 Muscle weakness (generalized): Secondary | ICD-10-CM | POA: Diagnosis not present

## 2020-12-01 NOTE — Therapy (Signed)
[facility] [place] [place], [place], [address] Phone: [phone]   Fax:  [phone]  Physical Therapy Evaluation  Patient Details  Name: Alice [name] MRN: [id number] Date of Birth: [birth year] Referring Provider (PT): Allie Bossier, [place]   Encounter Date: 12/01/2020   PT End of Session - 12/01/20 1525     Visit Number 1    Number of Visits 13    Date for PT Re-Evaluation 01/12/21    Authorization Type MCR    Progress Note Due on Visit 10    PT Start Time 1530    PT Stop Time [id number]    PT Time Calculation (min) 45 min    Activity Tolerance Patient limited by pain    Behavior During Therapy [facility] for tasks assessed/performed             Past Medical History:  Diagnosis Date   Anemia    Anxiety    Arthritis    Chest pain    Chronic bronchitis (HCC)    Chronic upper back pain    Depression    Depression    DVT (deep venous thrombosis) (HCC)    BLE   GERD (gastroesophageal reflux disease)    Headache    "weekly" (04/16/2015)   Hyperlipidemia    Hypertension    Joint pain    Migraine    "monthly" (04/16/2015)   Morbid obesity with BMI of 50.0-59.9, adult ([place])    Obesity    Other fatigue    Pneumonia 2014   Pulmonary embolism ([place])    Sinusitis nasal    Sleep apnea    "I'm suppose to wear a mask but I don't" (04/16/2015)   SOB (shortness of breath) on exertion    Varicose veins of right lower extremity    Venous stasis of lower extremity    right   Wears dentures    Wears glasses     Past Surgical History:  Procedure Laterality Date   CHOLECYSTECTOMY N/A 04/18/2015   Procedure: LAPAROSCOPIC CHOLECYSTECTOMY;  Surgeon: Ralene Ok, MD;  Location: [facility];  Service: General;  Laterality: N/A;   HYSTEROSCOPY WITH D & C  12/24/2001   Archie Endo 09/28/2010   I & D EXTREMITY Right 07/25/2016   Procedure: IRRIGATION AND DEBRIDEMENT RIGHT LEG ULCER, APPLY VERAFLO VAC;  Surgeon: [name], MD;   Location: [place] [facility];  Service: Orthopedics;  Laterality: Right;   I & D EXTREMITY Right 09/28/2018   Procedure: DEBRIDEMENT RIGHT LOWER LEG WITH PLACEMENT WITH PLACEMENT OF SKIN GRAFT AND VAC;  Surgeon: [name], MD;  Location: [place];  Service: Orthopedics;  Laterality: Right;   I & D EXTREMITY Right 10/03/2018   Procedure: REPEAT IRRIGATION AND DEBRIDEMENT RIGHT LOWER LEG, VAC PLACEMENT;  Surgeon: [name], MD;  Location: [place];  Service: Orthopedics;  Laterality: Right;   INCISE AND DRAIN ABCESS Right 07/14/2016   INCISION AND DRAINAGE Right 09/10/2008   leg:  skin and soft tissue and muscle/notes 09/15/2010   INCISION AND DRAINAGE Right 01/01/2008   Chronic venous stasis insufficiency ulcer,/notes 09/14/2010/   INCISION AND DRAINAGE Right [id number]   calf w/application wound vac/notes 07/20/2010   INCISION AND DRAINAGE Right 07/25/2016   IRRIGATION AND DEBRIDEMENT RIGHT LEG ULCER,   LAPAROSCOPIC GASTRIC BYPASS  ~ 2007   MULTIPLE TOOTH EXTRACTIONS     SKIN GRAFT SPLIT THICKNESS LEG / FOOT Right 07/25/2016   LEG   SKIN SPLIT GRAFT Right 07/27/2016  Procedure: SKIN GRAFT RIGHT LEG WITH THERASKIN APPLICATION;  Surgeon: [name], MD;  Location: [facility] [place];  Service: Orthopedics;  Laterality: Right;   TONSILLECTOMY      There were no vitals filed for this visit.    Subjective Assessment - 12/01/20 1533     Subjective Patient reports the pain began along the left side of her low back and travels to the posterior aspect of the Lt knee. She reports the pain began about a year ago with recent worsening in May 2022. She reports the pain could be due to cleaning and bending more and stress. She has MRI scheduled for Friday. She reports tingling along the posterior LLE that stops at the back of the knee. She reports frequent urination, but attributes this to her medications. She does report some vaginal numbness that is intermittent that she began to notice soon after her back pain initially began.     Pertinent History see extensive PMH above; chronic venous ulcer Rt lower leg    Limitations Sitting;Standing;Walking;Lifting;House hold activities    How long can you sit comfortably? 20 minutes    How long can you stand comfortably? needs to shift to her Rt to tolerate standing    How long can you walk comfortably? household distances    Patient Stated Goals "to get my back, back right"    Currently in Pain? Yes    Pain Score 9     Pain Location Back    Pain Orientation Left;Lower    Pain Descriptors / Indicators Stabbing    Pain Type Chronic pain    Pain Radiating Towards posterior LLE stops at popliteal fossa    Pain Onset More than a month ago    Pain Frequency Intermittent    Aggravating Factors  bending and laying on the left side    Pain Relieving Factors sitting, lyrica                [facility] PT Assessment - 12/01/20 0001       Assessment   Medical Diagnosis M43.16 (ICD-10-CM) - Spondylolisthesis, lumbar region    Referring Provider (PT) Czinsky, Stephani Police, PA    Hand Dominance Right    Next MD Visit nothing scheduled    Prior Therapy yes for her Rt leg      Precautions   Precautions None      Restrictions   Weight Bearing Restrictions No      Balance Screen   Has the patient fallen in the past 6 months No      [place] residence    Living Arrangements Alone    Type of Home Apartment    Additional Comments stairs      Prior Function   Level of Independence Independent    Vocation On disability    Leisure watch TV      Cognition   Overall Cognitive Status Within Functional Limits for tasks assessed      Observation/Other Assessments   Observations shifted to the Rt in sitting and standing    Focus on Therapeutic Outcomes (FOTO)  52% function to 68% predicted      Sensation   Light Touch Appears Intact      Coordination   Gross Motor Movements are Fluid and Coordinated Yes      AROM   Overall AROM Comments  most pain with lumbar flexion; gower sign present when transitioning from flexion to neutral    Lumbar Flexion  fingertips to patella, pain low back    Lumbar Extension 75% limited pain low back    Lumbar - Right Side Bend 50% limited pain low back    Lumbar - Left Side Bend 50% limited pain low back    Lumbar - Right Rotation 50% limited pain low back    Lumbar - Left Rotation 50% limited pain low back      Strength   Overall Strength Comments bilateral ankle strength 5/5 in seated; pain in low back with bilateral hip/knee MMT    Right Hip Flexion 4-/5    Left Hip Flexion 3+/5    Right Knee Flexion 4-/5    Right Knee Extension 4-/5    Left Knee Flexion 3+/5    Left Knee Extension 3+/5      Special Tests    Special Tests --   unable to tolerate positioning necessary for further lumbar/hip special tests   Other special tests (+) SLR LLE      Transfers   Comments significant use of BUE for sit to stand transfer      Ambulation/Gait   Ambulation/Gait Yes    Gait Comments WBOS, limited heel strike and push off bilaterally                        Objective measurements completed on examination: See above findings.       [facility] Adult PT Treatment/Exercise - 12/01/20 0001       Self-Care   Self-Care Other Self-Care Comments    Other Self-Care Comments  see patient education                    PT Education - 12/01/20 1553     Education Details Education on evaluation findings, POC, FOTO score, and HEP. f/u with referring provider due to complaints of intermittent vaginal numbness    Person(s) Educated Patient    Methods Explanation;Demonstration;Verbal cues;Handout    Comprehension Verbalized understanding;Returned demonstration              PT Short Term Goals - 12/01/20 1648       PT SHORT TERM GOAL #1   Title STG=LTG               PT Long Term Goals - 12/01/20 1648       PT LONG TERM GOAL #1   Title Patient will demonstrate at  least 25% improvement in lumbar AROM in all planes without significant increase in pain.    Baseline 50%-75% limitation in all planes with significant pain    Time 6    Period Weeks    Status New    Target Date 01/12/21      PT LONG TERM GOAL #2   Title Patient will demonstrate at least 4/5 strength in assessed BLE musculature to improve stability necessary for prolonged walking/standing.    Baseline see flowsheet    Time 6    Period Weeks    Status New    Target Date 01/12/21      PT LONG TERM GOAL #3   Title Patient will tolerate at least 10 minutes of standing activity without compensation to improve tolerance to complete household tasks.    Baseline Unable to tolerate standing upright, must shift to the Rt due to pain.    Time 6    Period Weeks    Status New    Target Date 01/12/21      PT LONG  TERM GOAL #4   Title Patient will report pain at worst as 4/10 to improve her overall quality of life.    Baseline 9/10    Time 6    Period Weeks    Status New    Target Date 01/12/21      PT LONG TERM GOAL #5   Title Patient will score at least 68% function on FOTO to signify clinically meaningful improvement in functional abilities.    Baseline 52%    Time 6    Period Weeks    Status New    Target Date 01/12/21                    Plan - 12/01/20 1553     Clinical Impression Statement Patient is a 50 y/o female who presents to OPPT with referral for lumbar spondylolisthesis with chief complaint of chronic left sided low back pain with referral to the posterior LLE that radiates to the popliteal fossa. She reports the pain began about a year ago of insidious onset with recent worsening over the past two months attributed to increased cleaning and bending activity. She reports intermittent vaginal numbness that has been ongoing since her back pain began a year ago. Overall evaluation was limited due to patient's high pain level and inability to tolerate various  positioning throughout evaluation. She is noted to have significant lumbar AROM limitations in all planes with increased low back pain reported with all movements, though flexion causing the most increased pain. She has pain with all seated BLE MMT with notable weakness throughout and has poor tolerance to maintaining neutral seated position as she shifts her weight to the Rt due to pain. She is unable to tolerate supine positioning for >1 minute, though does report an increase in pain with SLR on the LLE. She was unable to assume sidelying or prone positioning due to pain. Will trial directional preference at future sessions, though given her high irritability of pain with minimal testing procedures today this was deferred. She will potentially benefit from skilled PT to address her ROM deficits, BLE weakness, and pain in order to improve her overall functional mobility. In addition it was recommended that she f/u with physician as soon as possible in regards to her reports of intermittent chronic vaginal numbness with patient verbalizing understanding.    Personal Factors and Comorbidities Age;Comorbidity 3+;Fitness;Time since onset of injury/illness/exacerbation    Comorbidities see PMH above    Examination-Activity Limitations Bed Mobility;Sit;Squat;Stairs;Stand;Lift;Locomotion Level;Transfers    Examination-Participation Restrictions Cleaning;Shop    Stability/Clinical Decision Making Evolving/Moderate complexity    Clinical Decision Making Moderate    Rehab Potential Fair    PT Frequency --   1-2/week   PT Duration 6 weeks    PT Treatment/Interventions ADLs/Self Care Home Management;Cryotherapy;Electrical Stimulation;Moist Heat;Traction;Gait Scientist, forensic;Therapeutic activities;Therapeutic exercise;Balance training;Neuromuscular re-education;Patient/family education;Manual techniques;Passive range of motion;Dry needling;Taping    PT Next Visit Plan assess for directional preference,  palpation of L-spine, education on body mechanics/posture    PT Home Exercise Plan Access Code: [email or web address]    Recommended Other Services f/u with referring provider due to complaints of intermittent vaginal numbness    Consulted and Agree with Plan of Care Patient             Patient will benefit from skilled therapeutic intervention in order to improve the following deficits and impairments:  Abnormal gait, Decreased range of motion, Difficulty walking, Decreased endurance, Decreased activity tolerance, Pain, Improper body mechanics, Decreased strength, Postural  dysfunction  Visit Diagnosis: Chronic left-sided low back pain with left-sided sciatica  Muscle weakness (generalized)  Difficulty in walking, not elsewhere classified     Problem List Patient Active Problem List   Diagnosis Date Noted   SOB (shortness of breath) on exertion 09/15/2020   Vitamin D deficiency 09/15/2020   Hyperglycemia 09/15/2020   Subtherapeutic international normalized ratio (INR)    Postoperative pain    Hypoalbuminemia    Acute on chronic kidney failure (HCC)    Acute blood loss anemia    Benign essential HTN    Other fatigue 10/05/2018   Chronic ulcer of right leg, with fat layer exposed ([place]) 09/28/2018   Chronic ulcer of leg, right, with necrosis of muscle ([place])    Iron deficiency anemia 01/29/2018   Pulmonary embolus ([place]) 01/09/2018   Encounter for therapeutic drug monitoring 01/09/2018   Severe malnutrition ([place]) 08/24/2017   Acute kidney injury superimposed on CKD ([place]) 12/28/2016   Hx of skin graft 08/01/2016   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 ([place]) 07/25/2016   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 ([place]) 07/05/2016   Trichomonal infection 11/24/2015   Abdominal pain 05/02/2015   Symptomatic cholelithiasis 04/16/2015   Acute bilateral upper abdominal pain 04/16/2015   Microcytic anemia 05/18/2013    Hypotension, unspecified 05/17/2013   Anemia due to stage 3b chronic kidney disease ([place]) 05/17/2013   Hypokalemia 05/17/2013   Anal fissure 02/06/2013   Anal skin tag 02/06/2013   ALLERGIC RHINITIS 03/05/2010   LOW BACK PAIN SYNDROME 12/13/2007   OBESITY, MORBID 12/02/2006   HTN (hypertension) 12/02/2006   History of pulmonary embolism 12/02/2006   History of DVT (deep vein thrombosis) 12/02/2006   SYNDROME, POSTPHLEBITIC W/ULCER & INFLM 12/02/2006   GASTROESOPHAGEAL REFLUX DISEASE 12/02/2006   OSA (obstructive sleep apnea) 12/02/2006   Gwendolyn Grant, PT, DPT, ATC 12/01/20 5:18 PM   [facility] [address] [place], [place], [address] Phone: [phone]   Fax:  [phone]  Name: Dayrin Scofield MRN: [id number] Date of Birth: Aug 10, 1970

## 2020-12-02 DIAGNOSIS — F329 Major depressive disorder, single episode, unspecified: Secondary | ICD-10-CM | POA: Diagnosis not present

## 2020-12-02 DIAGNOSIS — G629 Polyneuropathy, unspecified: Secondary | ICD-10-CM | POA: Diagnosis not present

## 2020-12-02 DIAGNOSIS — R1013 Epigastric pain: Secondary | ICD-10-CM | POA: Diagnosis not present

## 2020-12-02 DIAGNOSIS — I10 Essential (primary) hypertension: Secondary | ICD-10-CM | POA: Diagnosis not present

## 2020-12-02 DIAGNOSIS — R7303 Prediabetes: Secondary | ICD-10-CM | POA: Diagnosis not present

## 2020-12-02 DIAGNOSIS — Z9229 Personal history of other drug therapy: Secondary | ICD-10-CM | POA: Diagnosis not present

## 2020-12-02 DIAGNOSIS — E559 Vitamin D deficiency, unspecified: Secondary | ICD-10-CM | POA: Diagnosis not present

## 2020-12-02 DIAGNOSIS — D508 Other iron deficiency anemias: Secondary | ICD-10-CM | POA: Diagnosis not present

## 2020-12-02 DIAGNOSIS — N1831 Chronic kidney disease, stage 3a: Secondary | ICD-10-CM | POA: Diagnosis not present

## 2020-12-02 DIAGNOSIS — G4733 Obstructive sleep apnea (adult) (pediatric): Secondary | ICD-10-CM | POA: Diagnosis not present

## 2020-12-02 DIAGNOSIS — R600 Localized edema: Secondary | ICD-10-CM | POA: Diagnosis not present

## 2020-12-03 ENCOUNTER — Other Ambulatory Visit: Payer: Medicare Other

## 2020-12-03 ENCOUNTER — Other Ambulatory Visit: Payer: Self-pay

## 2020-12-03 ENCOUNTER — Ambulatory Visit
Admission: RE | Admit: 2020-12-03 | Discharge: 2020-12-03 | Disposition: A | Discharge status: Home / Self Care | Payer: Medicare Other | Source: Ambulatory Visit | Attending: Rehabilitation | Admitting: Rehabilitation

## 2020-12-03 DIAGNOSIS — M545 Low back pain, unspecified: Secondary | ICD-10-CM | POA: Diagnosis not present

## 2020-12-03 DIAGNOSIS — M48061 Spinal stenosis, lumbar region without neurogenic claudication: Secondary | ICD-10-CM | POA: Diagnosis not present

## 2020-12-03 DIAGNOSIS — M4316 Spondylolisthesis, lumbar region: Secondary | ICD-10-CM

## 2020-12-04 ENCOUNTER — Other Ambulatory Visit: Payer: Medicare Other

## 2020-12-09 ENCOUNTER — Other Ambulatory Visit: Payer: Self-pay

## 2020-12-09 ENCOUNTER — Ambulatory Visit: Payer: Medicare Other

## 2020-12-09 DIAGNOSIS — M5442 Lumbago with sciatica, left side: Secondary | ICD-10-CM | POA: Diagnosis not present

## 2020-12-09 DIAGNOSIS — G8929 Other chronic pain: Secondary | ICD-10-CM | POA: Diagnosis not present

## 2020-12-09 DIAGNOSIS — M6281 Muscle weakness (generalized): Secondary | ICD-10-CM

## 2020-12-09 DIAGNOSIS — R262 Difficulty in walking, not elsewhere classified: Secondary | ICD-10-CM | POA: Diagnosis not present

## 2020-12-09 NOTE — Patient Instructions (Signed)

## 2020-12-09 NOTE — Therapy (Signed)
[place], [place], [address] Phone: [phone]   Fax:  [phone]  Physical Therapy Treatment  Patient Details  Name: Alice [name] MRN: [id number] Date of Birth: [birth year] Referring Provider (PT): Allie Bossier, [place]   Encounter Date: 12/09/2020   PT End of Session - 12/09/20 1602     Visit Number 2    Number of Visits 13    Date for PT Re-Evaluation 01/12/21    Authorization Type MCR    Progress Note Due on Visit 10    PT Start Time 1602    PT Stop Time [id number]    PT Time Calculation (min) 41 min    Activity Tolerance Patient limited by pain    Behavior During Therapy [facility] for tasks assessed/performed             Past Medical History:  Diagnosis Date   Anemia    Anxiety    Arthritis    Chest pain    Chronic bronchitis (HCC)    Chronic upper back pain    Depression    Depression    DVT (deep venous thrombosis) (HCC)    BLE   GERD (gastroesophageal reflux disease)    Headache    "weekly" (04/16/2015)   Hyperlipidemia    Hypertension    Joint pain    Migraine    "monthly" (04/16/2015)   Morbid obesity with BMI of 50.0-59.9, adult ([place])    Obesity    Other fatigue    Pneumonia 2014   Pulmonary embolism ([place])    Sinusitis nasal    Sleep apnea    "I'm suppose to wear a mask but I don't" (04/16/2015)   SOB (shortness of breath) on exertion    Varicose veins of right lower extremity    Venous stasis of lower extremity    right   Wears dentures    Wears glasses     Past Surgical History:  Procedure Laterality Date   CHOLECYSTECTOMY N/A 04/18/2015   Procedure: LAPAROSCOPIC CHOLECYSTECTOMY;  Surgeon: Ralene Ok, MD;  Location: [place];  Service: General;  Laterality: N/A;   HYSTEROSCOPY WITH D & C  12/24/2001   Archie Endo 09/28/2010   I & D EXTREMITY Right 07/25/2016   Procedure: IRRIGATION AND DEBRIDEMENT RIGHT LEG ULCER, APPLY VERAFLO VAC;  Surgeon: [name], MD;   Location: [place];  Service: Orthopedics;  Laterality: Right;   I & D EXTREMITY Right 09/28/2018   Procedure: DEBRIDEMENT RIGHT LOWER LEG WITH PLACEMENT WITH PLACEMENT OF SKIN GRAFT AND VAC;  Surgeon: [name], MD;  Location: [place];  Service: Orthopedics;  Laterality: Right;   I & D EXTREMITY Right 10/03/2018   Procedure: REPEAT IRRIGATION AND DEBRIDEMENT RIGHT LOWER LEG, VAC PLACEMENT;  Surgeon: [name], MD;  Location: [place];  Service: Orthopedics;  Laterality: Right;   INCISE AND DRAIN ABCESS Right 07/14/2016   INCISION AND DRAINAGE Right 09/10/2008   leg:  skin and soft tissue and muscle/notes 09/15/2010   INCISION AND DRAINAGE Right 01/01/2008   Chronic venous stasis insufficiency ulcer,/notes 09/14/2010/   INCISION AND DRAINAGE Right [id number]   calf w/application wound vac/notes 07/20/2010   INCISION AND DRAINAGE Right 07/25/2016   IRRIGATION AND DEBRIDEMENT RIGHT LEG ULCER,   LAPAROSCOPIC GASTRIC BYPASS  ~ 2007   MULTIPLE TOOTH EXTRACTIONS     SKIN GRAFT SPLIT THICKNESS LEG / FOOT Right 07/25/2016   LEG   SKIN SPLIT GRAFT Right 07/27/2016  Procedure: SKIN GRAFT RIGHT LEG WITH THERASKIN APPLICATION;  Surgeon: [name], MD;  Location: [place];  Service: Orthopedics;  Laterality: Right;   TONSILLECTOMY      There were no vitals filed for this visit.   Subjective Assessment - 12/09/20 1602     Subjective "I am feeling so/so. This morning was rough." She reports completing some of the exercises.    Pertinent History see extensive PMH above; chronic venous ulcer Rt lower leg    Limitations Sitting;Standing;Walking;Lifting;House hold activities    How long can you sit comfortably? 20 minutes    How long can you stand comfortably? needs to shift to her Rt to tolerate standing    How long can you walk comfortably? household distances    Patient Stated Goals "to get my back, back right"    Currently in Pain? Yes    Pain Score 9     Pain Location Back    Pain Orientation Left;Lower     Pain Descriptors / Indicators Stabbing    Pain Type Chronic pain    Pain Radiating Towards posterior LLE stops at popliteal fossa    Pain Onset More than a month ago    Pain Frequency Intermittent    Aggravating Factors  bending and laying on left side    Pain Relieving Factors sitting, lyrica                               [facility] Adult PT Treatment/Exercise - 12/09/20 0001       Self-Care   Other Self-Care Comments  see patient education      Lumbar Exercises: Standing   Other Standing Lumbar Exercises repeated lumbar extension 2 x 10, centralize pain      Lumbar Exercises: Prone   Other Prone Lumbar Exercises prone lying 5 minutes, no effect on pain                    PT Education - 12/09/20 1644     Education Details Education on lumbar anatomy and MRI. Education on posture, body mechanics, and sleep positioning. Education on centralization phenomenon. Updated HEP. Recommended to f/u with referring provider regarding MRI results.    Person(s) Educated Patient    Methods Explanation;Demonstration;Verbal cues;Handout    Comprehension Verbalized understanding;Returned demonstration;Verbal cues required              PT Short Term Goals - 12/01/20 1648       PT SHORT TERM GOAL #1   Title STG=LTG               PT Long Term Goals - 12/01/20 1648       PT LONG TERM GOAL #1   Title Patient will demonstrate at least 25% improvement in lumbar AROM in all planes without significant increase in pain.    Baseline 50%-75% limitation in all planes with significant pain    Time 6    Period Weeks    Status New    Target Date 01/12/21      PT LONG TERM GOAL #2   Title Patient will demonstrate at least 4/5 strength in assessed BLE musculature to improve stability necessary for prolonged walking/standing.    Baseline see flowsheet    Time 6    Period Weeks    Status New    Target Date 01/12/21      PT LONG TERM GOAL #3   Title  Patient  will tolerate at least 10 minutes of standing activity without compensation to improve tolerance to complete household tasks.    Baseline Unable to tolerate standing upright, must shift to the Rt due to pain.    Time 6    Period Weeks    Status New    Target Date 01/12/21      PT LONG TERM GOAL #4   Title Patient will report pain at worst as 4/10 to improve her overall quality of life.    Baseline 9/10    Time 6    Period Weeks    Status New    Target Date 01/12/21      PT LONG TERM GOAL #5   Title Patient will score at least 68% function on FOTO to signify clinically meaningful improvement in functional abilities.    Baseline 52%    Time 6    Period Weeks    Status New    Target Date 01/12/21                   Plan - 12/09/20 1645     Clinical Impression Statement Patient arrives with high pain levels about the left side of the low back and buttocks. Time spent discussing lumbar anatomy and MRI results and educating patient on role of therapy in treatment of current condition. She demonstrates initial positive response to standing lumbar extension as pain was centralized from the buttock to the center of her low back. She was encouraged to complete repeated extension as part of HEP. Time spent educating patient on body mechanics with lifting and appropriate posture with handout provided. Though location of pain was manipulated with extension biased movement, patient reported no change in pain intensity at end of session.    Personal Factors and Comorbidities Age;Comorbidity 3+;Fitness;Time since onset of injury/illness/exacerbation    Comorbidities see PMH above    Examination-Activity Limitations Bed Mobility;Sit;Squat;Stairs;Stand;Lift;Locomotion Level;Transfers    Examination-Participation Restrictions Cleaning;Shop    Stability/Clinical Decision Making Evolving/Moderate complexity    Rehab Potential Fair    PT Frequency --   1-2/week   PT Duration 6 weeks    PT  Treatment/Interventions ADLs/Self Care Home Management;Cryotherapy;Electrical Stimulation;Moist Heat;Traction;Gait Scientist, forensic;Therapeutic activities;Therapeutic exercise;Balance training;Neuromuscular re-education;Patient/family education;Manual techniques;Passive range of motion;Dry needling;Taping    PT Next Visit Plan core strengthening, hip strengthening, hip hinge, palpation of L-spine, education on body mechanics/posture    PT Home Exercise Plan Access Code: [email or web address]    Recommended Other Services f/u with referring provider regarding MRI results.    Consulted and Agree with Plan of Care Patient             Patient will benefit from skilled therapeutic intervention in order to improve the following deficits and impairments:  Abnormal gait, Decreased range of motion, Difficulty walking, Decreased endurance, Decreased activity tolerance, Pain, Improper body mechanics, Decreased strength, Postural dysfunction  Visit Diagnosis: Chronic left-sided low back pain with left-sided sciatica  Muscle weakness (generalized)  Difficulty in walking, not elsewhere classified     Problem List Patient Active Problem List   Diagnosis Date Noted   SOB (shortness of breath) on exertion 09/15/2020   Vitamin D deficiency 09/15/2020   Hyperglycemia 09/15/2020   Subtherapeutic international normalized ratio (INR)    Postoperative pain    Hypoalbuminemia    Acute on chronic kidney failure (HCC)    Acute blood loss anemia    Benign essential HTN    Other fatigue 10/05/2018   Chronic ulcer of right  leg, with fat layer exposed ([place]) 09/28/2018   Chronic ulcer of leg, right, with necrosis of muscle ([place])    Iron deficiency anemia 01/29/2018   Pulmonary embolus ([place]) 01/09/2018   Encounter for therapeutic drug monitoring 01/09/2018   Severe malnutrition ([place]) 08/24/2017   Acute kidney injury superimposed on CKD ([place]) 12/28/2016   Hx of skin graft 08/01/2016   Venous ulcer of right  lower extremity with varicose veins ([place]) 07/25/2016   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 ([place]) 07/05/2016   Trichomonal infection 11/24/2015   Abdominal pain 05/02/2015   Symptomatic cholelithiasis 04/16/2015   Acute bilateral upper abdominal pain 04/16/2015   Microcytic anemia 05/18/2013   Hypotension, unspecified 05/17/2013   Anemia due to stage 3b chronic kidney disease ([place]) 05/17/2013   Hypokalemia 05/17/2013   Anal fissure 02/06/2013   Anal skin tag 02/06/2013   ALLERGIC RHINITIS 03/05/2010   LOW BACK PAIN SYNDROME 12/13/2007   OBESITY, MORBID 12/02/2006   HTN (hypertension) 12/02/2006   History of pulmonary embolism 12/02/2006   History of DVT (deep vein thrombosis) 12/02/2006   SYNDROME, POSTPHLEBITIC W/ULCER & INFLM 12/02/2006   GASTROESOPHAGEAL REFLUX DISEASE 12/02/2006   OSA (obstructive sleep apnea) 12/02/2006   Gwendolyn Grant, PT, DPT, ATC 12/09/20 4:52 PM  [facility] [address] [place], [place], [address] Phone: [phone]   Fax:  [phone]  Name: Alice [name] MRN: [id number] Date of Birth: 03/01/1971

## 2020-12-16 ENCOUNTER — Ambulatory Visit: Payer: Medicare Other | Attending: *Deleted

## 2020-12-16 ENCOUNTER — Other Ambulatory Visit: Payer: Self-pay

## 2020-12-16 DIAGNOSIS — G8929 Other chronic pain: Secondary | ICD-10-CM | POA: Insufficient documentation

## 2020-12-16 DIAGNOSIS — M5442 Lumbago with sciatica, left side: Secondary | ICD-10-CM | POA: Diagnosis not present

## 2020-12-16 DIAGNOSIS — M6281 Muscle weakness (generalized): Secondary | ICD-10-CM | POA: Insufficient documentation

## 2020-12-16 DIAGNOSIS — R262 Difficulty in walking, not elsewhere classified: Secondary | ICD-10-CM | POA: Insufficient documentation

## 2020-12-16 NOTE — Therapy (Signed)
Alice [name], [place], [address] Phone: [phone]   Fax:  [phone]  Physical Therapy Treatment  Patient Details  Name: Alice [name] MRN: [id number] Date of Birth: Apr 23, 1971 Referring Provider (PT): Allie Bossier, [place]   Encounter Date: 12/16/2020   PT End of Session - 12/16/20 1617     Visit Number 3    Number of Visits 13    Date for PT Re-Evaluation 01/12/21    Authorization Type MCR    Progress Note Due on Visit 10    PT Start Time [id number]    PT Stop Time 1656    PT Time Calculation (min) 39 min    Activity Tolerance Patient limited by pain    Behavior During Therapy [facility] for tasks assessed/performed             Past Medical History:  Diagnosis Date   Anemia    Anxiety    Arthritis    Chest pain    Chronic bronchitis (HCC)    Chronic upper back pain    Depression    Depression    DVT (deep venous thrombosis) (HCC)    BLE   GERD (gastroesophageal reflux disease)    Headache    "weekly" (04/16/2015)   Hyperlipidemia    Hypertension    Joint pain    Migraine    "monthly" (04/16/2015)   Morbid obesity with BMI of 50.0-59.9, adult ([place])    Obesity    Other fatigue    Pneumonia 2014   Pulmonary embolism ([place])    Sinusitis nasal    Sleep apnea    "I'm suppose to wear a mask but I don't" (04/16/2015)   SOB (shortness of breath) on exertion    Varicose veins of right lower extremity    Venous stasis of lower extremity    right   Wears dentures    Wears glasses     Past Surgical History:  Procedure Laterality Date   CHOLECYSTECTOMY N/A 04/18/2015   Procedure: LAPAROSCOPIC CHOLECYSTECTOMY;  Surgeon: Ralene Ok, MD;  Location: [place];  Service: General;  Laterality: N/A;   HYSTEROSCOPY WITH D & C  12/24/2001   Archie Endo 09/28/2010   I & D EXTREMITY Right 07/25/2016   Procedure: IRRIGATION AND DEBRIDEMENT RIGHT LEG ULCER, APPLY VERAFLO VAC;  Surgeon: [name], MD;   Location: [place];  Service: Orthopedics;  Laterality: Right;   I & D EXTREMITY Right 09/28/2018   Procedure: DEBRIDEMENT RIGHT LOWER LEG WITH PLACEMENT WITH PLACEMENT OF SKIN GRAFT AND VAC;  Surgeon: [name], MD;  Location: [place];  Service: Orthopedics;  Laterality: Right;   I & D EXTREMITY Right 10/03/2018   Procedure: REPEAT IRRIGATION AND DEBRIDEMENT RIGHT LOWER LEG, VAC PLACEMENT;  Surgeon: [name], MD;  Location: [place];  Service: Orthopedics;  Laterality: Right;   INCISE AND DRAIN ABCESS Right 07/14/2016   INCISION AND DRAINAGE Right 09/10/2008   leg:  skin and soft tissue and muscle/notes 09/15/2010   INCISION AND DRAINAGE Right 01/01/2008   Chronic venous stasis insufficiency ulcer,/notes 09/14/2010/   INCISION AND DRAINAGE Right [id number]   calf w/application wound vac/notes 07/20/2010   INCISION AND DRAINAGE Right 07/25/2016   IRRIGATION AND DEBRIDEMENT RIGHT LEG ULCER,   LAPAROSCOPIC GASTRIC BYPASS  ~ 2007   MULTIPLE TOOTH EXTRACTIONS     SKIN GRAFT SPLIT THICKNESS LEG / FOOT Right 07/25/2016   LEG   SKIN SPLIT GRAFT Right 07/27/2016  Procedure: SKIN GRAFT RIGHT LEG WITH THERASKIN APPLICATION;  Surgeon: [name], MD;  Location: [place];  Service: Orthopedics;  Laterality: Right;   TONSILLECTOMY      There were no vitals filed for this visit.   Subjective Assessment - 12/16/20 1618     Subjective "I am feeling ok." She was unable to get an earlier appointment regarding her imaging results, but has an appointment scheduled sometime this month. She felt like doing lumbar extension just aggravated her nerves. She feels that her pain is getting worse.    Pertinent History see extensive PMH above; chronic venous ulcer Rt lower leg    Limitations Sitting;Standing;Walking;Lifting;House hold activities    How long can you sit comfortably? 20 minutes    How long can you stand comfortably? needs to shift to her Rt to tolerate standing    How long can you walk comfortably? household  distances    Patient Stated Goals "to get my back, back right"    Currently in Pain? Yes    Pain Score 8     Pain Location Back    Pain Orientation Lower    Pain Descriptors / Indicators Nagging    Pain Type Chronic pain    Pain Onset More than a month ago    Pain Frequency Intermittent                [facility] PT Assessment - 12/16/20 0001       AROM   Lumbar Flexion fingertips to patella, pain low back    Lumbar Extension 75% limited pain low back                           [facility] Adult PT Treatment/Exercise - 12/16/20 0001       Self-Care   Other Self-Care Comments  see patient educaiton      Lumbar Exercises: Stretches   Lower Trunk Rotation 60 seconds    Figure 4 Stretch 20 seconds      Lumbar Exercises: Seated   Long Arc Quad on Chair 10 reps    LAQ on Chair Limitations bilateral      Lumbar Exercises: Supine   Pelvic Tilt 10 reps    Pelvic Tilt Limitations x2    Bent Knee Raise 10 reps    Bent Knee Raise Limitations x2    Other Supine Lumbar Exercises hip adduction isometric 2  x10                    PT Education - 12/16/20 1654     Education Details Updated HEP.    Person(s) Educated Patient    Methods Explanation;Demonstration;Verbal cues;Tactile cues;Handout    Comprehension Verbalized understanding;Returned demonstration;Verbal cues required;Tactile cues required              PT Short Term Goals - 12/01/20 1648       PT SHORT TERM GOAL #1   Title STG=LTG               PT Long Term Goals - 12/01/20 1648       PT LONG TERM GOAL #1   Title Patient will demonstrate at least 25% improvement in lumbar AROM in all planes without significant increase in pain.    Baseline 50%-75% limitation in all planes with significant pain    Time 6    Period Weeks    Status New    Target Date 01/12/21  PT LONG TERM GOAL #2   Title Patient will demonstrate at least 4/5 strength in assessed BLE musculature to improve  stability necessary for prolonged walking/standing.    Baseline see flowsheet    Time 6    Period Weeks    Status New    Target Date 01/12/21      PT LONG TERM GOAL #3   Title Patient will tolerate at least 10 minutes of standing activity without compensation to improve tolerance to complete household tasks.    Baseline Unable to tolerate standing upright, must shift to the Rt due to pain.    Time 6    Period Weeks    Status New    Target Date 01/12/21      PT LONG TERM GOAL #4   Title Patient will report pain at worst as 4/10 to improve her overall quality of life.    Baseline 9/10    Time 6    Period Weeks    Status New    Target Date 01/12/21      PT LONG TERM GOAL #5   Title Patient will score at least 68% function on FOTO to signify clinically meaningful improvement in functional abilities.    Baseline 52%    Time 6    Period Weeks    Status New    Target Date 01/12/21                   Plan - 12/16/20 1630     Clinical Impression Statement Patient continues to report high pain levels upon arrival. Her lumbar flexion and extension AROM have remained unchanged since initial evaluation. Began gentle lumbar mobility and core strengthening with patient having fair tolerance to ther ex. She has difficulty maintaining neutral alignment in hooklying as she has tendency to shift her weight to the Rt hip. She has difficulty with targeted hip abductor and quadricep strengthening on the LLE requiring constant cueing for proper activation. No change in pain at end of session.    Personal Factors and Comorbidities Age;Comorbidity 3+;Fitness;Time since onset of injury/illness/exacerbation    Comorbidities see PMH above    Examination-Activity Limitations Bed Mobility;Sit;Squat;Stairs;Stand;Lift;Locomotion Level;Transfers    Examination-Participation Restrictions Cleaning;Shop    Stability/Clinical Decision Making --    Rehab Potential --    PT Frequency --   1-2/week   PT  Duration --    PT Treatment/Interventions ADLs/Self Care Home Management;Cryotherapy;Electrical Stimulation;Moist Heat;Traction;Gait Scientist, forensic;Therapeutic activities;Therapeutic exercise;Balance training;Neuromuscular re-education;Patient/family education;Manual techniques;Passive range of motion;Dry needling;Taping    PT Next Visit Plan core strengthening, hip strengthening, hip hinge, palpation of L-spine, education on body mechanics/posture    PT Home Exercise Plan Access Code: [email or web address]    Consulted and Agree with Plan of Care Patient             Patient will benefit from skilled therapeutic intervention in order to improve the following deficits and impairments:  Abnormal gait, Decreased range of motion, Difficulty walking, Decreased endurance, Decreased activity tolerance, Pain, Improper body mechanics, Decreased strength, Postural dysfunction  Visit Diagnosis: Chronic left-sided low back pain with left-sided sciatica  Muscle weakness (generalized)  Difficulty in walking, not elsewhere classified     Problem List Patient Active Problem List   Diagnosis Date Noted   SOB (shortness of breath) on exertion 09/15/2020   Vitamin D deficiency 09/15/2020   Hyperglycemia 09/15/2020   Subtherapeutic international normalized ratio (INR)    Postoperative pain    Hypoalbuminemia    Acute on chronic  kidney failure (HCC)    Acute blood loss anemia    Benign essential HTN    Other fatigue 10/05/2018   Chronic ulcer of right leg, with fat layer exposed ([place]) 09/28/2018   Chronic ulcer of leg, right, with necrosis of muscle ([place])    Iron deficiency anemia 01/29/2018   Pulmonary embolus ([place]) 01/09/2018   Encounter for therapeutic drug monitoring 01/09/2018   Severe malnutrition ([place]) 08/24/2017   Acute kidney injury superimposed on CKD ([place]) 12/28/2016   Hx of skin graft 08/01/2016   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 ([place]) 07/25/2016   Idiopathic  chronic venous 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 ([place]) 07/05/2016   Trichomonal infection 11/24/2015   Abdominal pain 05/02/2015   Symptomatic cholelithiasis 04/16/2015   Acute bilateral upper abdominal pain 04/16/2015   Microcytic anemia 05/18/2013   Hypotension, unspecified 05/17/2013   Anemia due to stage 3b chronic kidney disease ([place]) 05/17/2013   Hypokalemia 05/17/2013   Anal fissure 02/06/2013   Anal skin tag 02/06/2013   ALLERGIC RHINITIS 03/05/2010   LOW BACK PAIN SYNDROME 12/13/2007   OBESITY, MORBID 12/02/2006   HTN (hypertension) 12/02/2006   History of pulmonary embolism 12/02/2006   History of DVT (deep vein thrombosis) 12/02/2006   SYNDROME, POSTPHLEBITIC W/ULCER & INFLM 12/02/2006   GASTROESOPHAGEAL REFLUX DISEASE 12/02/2006   OSA (obstructive sleep apnea) 12/02/2006   Gwendolyn Grant, PT, DPT, ATC 12/16/20 5:10 PM  [facility] [address] [place], [place], [address] Phone: [phone]   Fax:  [phone]  Name: Kelly-Ann Eveleth MRN: [id number] Date of Birth: [birth year]

## 2020-12-23 ENCOUNTER — Ambulatory Visit: Payer: Medicare Other

## 2020-12-23 ENCOUNTER — Other Ambulatory Visit: Payer: Self-pay

## 2020-12-23 DIAGNOSIS — M5442 Lumbago with sciatica, left side: Secondary | ICD-10-CM

## 2020-12-23 DIAGNOSIS — G8929 Other chronic pain: Secondary | ICD-10-CM | POA: Diagnosis not present

## 2020-12-23 DIAGNOSIS — M6281 Muscle weakness (generalized): Secondary | ICD-10-CM | POA: Diagnosis not present

## 2020-12-23 DIAGNOSIS — R262 Difficulty in walking, not elsewhere classified: Secondary | ICD-10-CM

## 2020-12-23 NOTE — Therapy (Signed)
[place], [place], [address] Phone: [phone]   Fax:  [phone]  Physical Therapy Treatment/Discharge   Patient Details  Name: Alice [name] MRN: [id number] Date of Birth: 1971-01-11 Referring Provider (PT): Allie Bossier, [place]   Encounter Date: 12/23/2020   PT End of Session - 12/23/20 1531     Visit Number 4    Number of Visits 13    Date for PT Re-Evaluation 01/12/21    Authorization Type MCR    Progress Note Due on Visit 10    PT Start Time 1962    PT Stop Time 2297    PT Time Calculation (min) 44 min    Activity Tolerance Patient limited by pain    Behavior During Therapy [facility] for tasks assessed/performed             Past Medical History:  Diagnosis Date   Anemia    Anxiety    Arthritis    Chest pain    Chronic bronchitis (HCC)    Chronic upper back pain    Depression    Depression    DVT (deep venous thrombosis) (HCC)    BLE   GERD (gastroesophageal reflux disease)    Headache    "weekly" (04/16/2015)   Hyperlipidemia    Hypertension    Joint pain    Migraine    "monthly" (04/16/2015)   Morbid obesity with BMI of 50.0-59.9, adult ([place])    Obesity    Other fatigue    Pneumonia 2014   Pulmonary embolism ([place])    Sinusitis nasal    Sleep apnea    "I'm suppose to wear a mask but I don't" (04/16/2015)   SOB (shortness of breath) on exertion    Varicose veins of right lower extremity    Venous stasis of lower extremity    right   Wears dentures    Wears glasses     Past Surgical History:  Procedure Laterality Date   CHOLECYSTECTOMY N/A 04/18/2015   Procedure: LAPAROSCOPIC CHOLECYSTECTOMY;  Surgeon: Ralene Ok, MD;  Location: [place];  Service: General;  Laterality: N/A;   HYSTEROSCOPY WITH D & C  12/24/2001   Archie Endo 09/28/2010   I & D EXTREMITY Right 07/25/2016   Procedure: IRRIGATION AND DEBRIDEMENT RIGHT LEG ULCER, APPLY VERAFLO VAC;  Surgeon: [name],  MD;  Location: [place];  Service: Orthopedics;  Laterality: Right;   I & D EXTREMITY Right 09/28/2018   Procedure: DEBRIDEMENT RIGHT LOWER LEG WITH PLACEMENT WITH PLACEMENT OF SKIN GRAFT AND VAC;  Surgeon: [name], MD;  Location: [place];  Service: Orthopedics;  Laterality: Right;   I & D EXTREMITY Right 10/03/2018   Procedure: REPEAT IRRIGATION AND DEBRIDEMENT RIGHT LOWER LEG, VAC PLACEMENT;  Surgeon: [name], MD;  Location: [facility];  Service: Orthopedics;  Laterality: Right;   INCISE AND DRAIN ABCESS Right 07/14/2016   INCISION AND DRAINAGE Right 09/10/2008   leg:  skin and soft tissue and muscle/notes 09/15/2010   INCISION AND DRAINAGE Right 01/01/2008   Chronic venous stasis insufficiency ulcer,/notes 09/14/2010/   INCISION AND DRAINAGE Right 01/8920   calf w/application wound vac/notes 07/20/2010   INCISION AND DRAINAGE Right 07/25/2016   IRRIGATION AND DEBRIDEMENT RIGHT LEG ULCER,   LAPAROSCOPIC GASTRIC BYPASS  ~ 2007   MULTIPLE TOOTH EXTRACTIONS     SKIN GRAFT SPLIT THICKNESS LEG / FOOT Right 07/25/2016   LEG   SKIN SPLIT GRAFT Right 07/27/2016  Procedure: SKIN GRAFT RIGHT LEG WITH THERASKIN APPLICATION;  Surgeon: [name], MD;  Location: [place];  Service: Orthopedics;  Laterality: Right;   TONSILLECTOMY      There were no vitals filed for this visit.    Subjective Assessment - 12/23/20 1532     Subjective Patient her left low back is hurting. She feels as though her back pain is worsening since beginning PT. She reports compliance with HEP.    Pertinent History see extensive PMH above; chronic venous ulcer Rt lower leg    Limitations Sitting;Standing;Walking;Lifting;House hold activities    How long can you sit comfortably? has to constantly readjust    How long can you stand comfortably? needs to shift to her Rt to tolerate standing    How long can you walk comfortably? household distances    Patient Stated Goals "to get my back, back right"    Currently in Pain? Yes     Pain Score 10-Worst pain ever    Pain Location Back    Pain Orientation Left;Lower    Pain Descriptors / Indicators Tightness   knot   Pain Type Chronic pain    Pain Radiating Towards posterior LLE stops at politeal fossa    Pain Onset More than a month ago    Pain Frequency Constant    Aggravating Factors  bending, standing, walking, laying on left side    Pain Relieving Factors sitting, medicaton    Effect of Pain on Daily Activities significant difficulty                [facility] PT Assessment - 12/23/20 0001       Assessment   Medical Diagnosis M43.16 (ICD-10-CM) - Spondylolisthesis, lumbar region    Referring Provider (PT) Czinsky, Stephani Police, PA      Observation/Other Assessments   Focus on Therapeutic Outcomes (FOTO)  38% function      AROM   Overall AROM Comments pain with all lumbar AROM    Lumbar Flexion fingertips to patella; shifts to the Rt    Lumbar Extension 75% limited    Lumbar - Right Side Bend 50% limited    Lumbar - Left Side Bend 50% limited    Lumbar - Right Rotation 50% limited    Lumbar - Left Rotation 50% limited      Strength   Right Hip Flexion 4-/5    Left Hip Flexion 3+/5    Right Knee Flexion 4-/5    Right Knee Extension 4-/5    Left Knee Flexion 3+/5    Left Knee Extension 3+/5                        Objective measurements completed on examination: See above findings.       [place] Adult PT Treatment/Exercise - 12/23/20 0001       Self-Care   Other Self-Care Comments  see patient educaiton      Lumbar Exercises: Seated   Long Arc Quad on Chair 10 reps    LAQ on Chair Limitations bilateral    Other Seated Lumbar Exercises seated pelvic tilts 2 x 10    Other Seated Lumbar Exercises seated march 2  x10      Manual Therapy   Manual therapy comments Lt LAD 1 min, STM to Lt lumbar paraspinals, QL, glute max                    PT Education - 12/23/20  1627     Education Details Education on re-assessment  findings, D/C education, recommendation to f/u with referring provider, posture and body mechanics education, reviewed HEP    Person(s) Educated Patient    Methods Explanation    Comprehension Verbalized understanding              PT Short Term Goals - 12/01/20 1648       PT SHORT TERM GOAL #1   Title STG=LTG               PT Long Term Goals - 12/23/20 1611       PT LONG TERM GOAL #1   Title Patient will demonstrate at least 25% improvement in lumbar AROM in all planes without significant increase in pain.    Baseline 50%-75% limitation in all planes with significant pain    Time 6    Period Weeks    Status Not Met      PT LONG TERM GOAL #2   Title Patient will demonstrate at least 4/5 strength in assessed BLE musculature to improve stability necessary for prolonged walking/standing.    Baseline see flowsheet    Time 6    Period Weeks    Status Not Met      PT LONG TERM GOAL #3   Title Patient will tolerate at least 10 minutes of standing activity without compensation to improve tolerance to complete household tasks.    Baseline Unable to tolerate standing upright, must shift to the Rt due to pain.    Time 6    Period Weeks    Status Not Met      PT LONG TERM GOAL #4   Title Patient will report pain at worst as 4/10 to improve her overall quality of life.    Baseline 9/10    Time 6    Period Weeks    Status Not Met      PT LONG TERM GOAL #5   Title Patient will score at least 68% function on FOTO to signify clinically meaningful improvement in functional abilities.    Baseline 52%    Time 6    Period Weeks    Status Not Met                    Plan - 12/23/20 1620     Clinical Impression Statement Patient has attended 4 PT sessions since start of care with little functional improvement. She feels as though her back pain has worsened since beginning PT, becoming tearful when expressing her frustration with her ongoing pain. Her lumbar AROM is  unchanged compared to baseline and she continues to report low back pain with all planes of movement and there is no change in her LE strength compared to initial evaluation. She has not responded to manual techniques, various positioning techniques, or ther ex in regards to reducing her back pain. She continues to have poor tolerance to any position and has difficulty with standing and walking due to her pain. Due to the lack of overall progress in PT she is appropriate for discharge at this time with recommendation to f/u with referring provider for further evaluation and treatment.    Personal Factors and Comorbidities Age;Comorbidity 3+;Fitness;Time since onset of injury/illness/exacerbation    Comorbidities see PMH above    Examination-Activity Limitations Bed Mobility;Sit;Squat;Stairs;Stand;Lift;Locomotion Level;Transfers    Examination-Participation Restrictions Cleaning;Shop    PT Frequency --    PT Treatment/Interventions ADLs/Self Care Home Management;Cryotherapy;Electrical Stimulation;Moist Heat;Traction;Gait training;Stair  training;Therapeutic activities;Therapeutic exercise;Balance training;Neuromuscular re-education;Patient/family education;Manual techniques;Passive range of motion;Dry needling;Taping    PT Next Visit Plan --    PT Home Exercise Plan Access Code: [email or web address]    Recommended Other Services f/u with referring provider for further assessment/treatment    Consulted and Agree with Plan of Care Patient             Patient will benefit from skilled therapeutic intervention in order to improve the following deficits and impairments:  Abnormal gait, Decreased range of motion, Difficulty walking, Decreased endurance, Decreased activity tolerance, Pain, Improper body mechanics, Decreased strength, Postural dysfunction  Visit Diagnosis: Chronic left-sided low back pain with left-sided sciatica  Muscle weakness (generalized)  Difficulty in walking, not elsewhere  classified     Problem List Patient Active Problem List   Diagnosis Date Noted   SOB (shortness of breath) on exertion 09/15/2020   Vitamin D deficiency 09/15/2020   Hyperglycemia 09/15/2020   Subtherapeutic international normalized ratio (INR)    Postoperative pain    Hypoalbuminemia    Acute on chronic kidney failure ([place])    Acute blood loss anemia    Benign essential HTN    Other fatigue 10/05/2018   Chronic ulcer of right leg, with fat layer exposed ([place]) 09/28/2018   Chronic ulcer of leg, right, with necrosis of muscle ([place])    Iron deficiency anemia 01/29/2018   Pulmonary embolus ([place]) 01/09/2018   Encounter for therapeutic drug monitoring 01/09/2018   Severe malnutrition ([place]) 08/24/2017   Acute kidney injury superimposed on CKD ([place]) 12/28/2016   Hx of skin graft 08/01/2016   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 ([place]) 07/25/2016   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 ([place]) 07/05/2016   Trichomonal infection 11/24/2015   Abdominal pain 05/02/2015   Symptomatic cholelithiasis 04/16/2015   Acute bilateral upper abdominal pain 04/16/2015   Microcytic anemia 05/18/2013   Hypotension, unspecified 05/17/2013   Anemia due to stage 3b chronic kidney disease ([place]) 05/17/2013   Hypokalemia 05/17/2013   Anal fissure 02/06/2013   Anal skin tag 02/06/2013   ALLERGIC RHINITIS 03/05/2010   LOW BACK PAIN SYNDROME 12/13/2007   OBESITY, MORBID 12/02/2006   HTN (hypertension) 12/02/2006   History of pulmonary embolism 12/02/2006   History of DVT (deep vein thrombosis) 12/02/2006   SYNDROME, POSTPHLEBITIC W/ULCER & INFLM 12/02/2006   GASTROESOPHAGEAL REFLUX DISEASE 12/02/2006   OSA (obstructive sleep apnea) 12/02/2006   PHYSICAL THERAPY DISCHARGE SUMMARY  Visits from Start of Care: 4  Current functional level related to goals / functional outcomes: Not met   Remaining deficits: See impression  above   Education / Equipment: See education above   Patient agrees to discharge. Patient goals were not met. Patient is being discharged due to lack of progress. Gwendolyn Grant, PT, DPT, ATC 12/23/20 5:13 PM  [facility] [address] [place], [place], [address] Phone: [phone]   Fax:  [phone]  Name: Alice [name] MRN: [id number] Date of Birth: 04-06-1971

## 2020-12-24 ENCOUNTER — Other Ambulatory Visit: Payer: Self-pay | Admitting: Physician Assistant

## 2020-12-29 ENCOUNTER — Encounter: Payer: Self-pay | Admitting: Oncology

## 2020-12-29 ENCOUNTER — Ambulatory Visit: Payer: Medicare Other | Admitting: Clinical

## 2020-12-29 ENCOUNTER — Other Ambulatory Visit: Payer: Self-pay

## 2020-12-29 ENCOUNTER — Other Ambulatory Visit (HOSPITAL_COMMUNITY)
Admission: RE | Admit: 2020-12-29 | Discharge: 2020-12-29 | Disposition: A | Discharge status: Home / Self Care | Payer: Medicare Other | Source: Ambulatory Visit | Attending: Family Medicine | Admitting: Family Medicine

## 2020-12-29 ENCOUNTER — Encounter: Payer: Self-pay | Admitting: Family Medicine

## 2020-12-29 ENCOUNTER — Ambulatory Visit (INDEPENDENT_AMBULATORY_CARE_PROVIDER_SITE_OTHER): Payer: Medicare Other | Admitting: Family Medicine

## 2020-12-29 ENCOUNTER — Encounter: Payer: Medicare Other | Admitting: Clinical

## 2020-12-29 VITALS — BP 134/99 | HR 84 | Wt 303.0 lb

## 2020-12-29 DIAGNOSIS — Z114 Encounter for screening for human immunodeficiency virus [HIV]: Secondary | ICD-10-CM

## 2020-12-29 DIAGNOSIS — M47816 Spondylosis without myelopathy or radiculopathy, lumbar region: Secondary | ICD-10-CM | POA: Diagnosis not present

## 2020-12-29 DIAGNOSIS — Z113 Encounter for screening for infections with a predominantly sexual mode of transmission: Secondary | ICD-10-CM

## 2020-12-29 DIAGNOSIS — N898 Other specified noninflammatory disorders of vagina: Secondary | ICD-10-CM | POA: Diagnosis not present

## 2020-12-29 DIAGNOSIS — Z1151 Encounter for screening for human papillomavirus (HPV): Secondary | ICD-10-CM | POA: Insufficient documentation

## 2020-12-29 DIAGNOSIS — Z01411 Encounter for gynecological examination (general) (routine) with abnormal findings: Secondary | ICD-10-CM | POA: Insufficient documentation

## 2020-12-29 DIAGNOSIS — Z124 Encounter for screening for malignant neoplasm of cervix: Secondary | ICD-10-CM | POA: Diagnosis not present

## 2020-12-29 DIAGNOSIS — Z5941 Food insecurity: Secondary | ICD-10-CM

## 2020-12-29 DIAGNOSIS — M4316 Spondylolisthesis, lumbar region: Secondary | ICD-10-CM | POA: Diagnosis not present

## 2020-12-29 DIAGNOSIS — R300 Dysuria: Secondary | ICD-10-CM

## 2020-12-29 DIAGNOSIS — Z1331 Encounter for screening for depression: Secondary | ICD-10-CM

## 2020-12-29 LAB — POCT URINALYSIS DIP (DEVICE)
Bilirubin Urine: NEGATIVE
Glucose, UA: NEGATIVE mg/dL
Ketones, ur: NEGATIVE mg/dL
Nitrite: NEGATIVE
Protein, ur: NEGATIVE mg/dL
Specific Gravity, Urine: 1.015 (ref 1.005–1.030)
Urobilinogen, UA: 0.2 mg/dL (ref 0.0–1.0)
pH: 5.5 (ref 5.0–8.0)

## 2020-12-29 NOTE — Progress Notes (Signed)
Liletta IUD placed 03/29/2017; explained to patient this is not due for removal until 7 years from insertion. Patient reports pelvic pain and pressure. Also, endorses burning with urination. Patient is concerned these symptoms are related to IUD. Clean catch urine collected for UA. UA positive for trace leukocytes and trace hemoglobin.   Last PAP completed 03/29/2017; agreeable to PAP today. Would like STD testing including blood work. Last mammogram 07/14/20.  PHQ-9 positive for SI. History significant for depression. Currently taking 200 mg Wellbutrin BID. Report given to [facility] who will come to bedside to see patient.   Apolonio Schneiders RN 12/29/20

## 2020-12-29 NOTE — BH Specialist Note (Signed)
[place] Initial In-Person Visit  MRN: [id number] Name: Alice [name]  Less than 15 minute introduction to [facility] services; pt attributes positive depression screening to physical pain in her back and life stress; agrees to call back Roselyn Reef at [phone] to schedule virtual consult visit, and will call MyChart Help desk to gain access to her MyChart account. Pt also given information about [place], to use as needed 24/7. Passive SI with no intent and no plan.   Garlan Fair, LCSW  Depression screen [facility] 2/9 12/29/2020 09/15/2020 12/10/2018 10/25/2018  Decreased Interest 0 3 0 0  Down, Depressed, Hopeless 1 2 0 0  PHQ - 2 Score 1 5 0 0  Altered sleeping 1 0 - 1  Tired, decreased energy 1 0 - 1  Change in appetite (No Data) 3 - 1  Feeling bad or failure about yourself  0 0 - 1  Trouble concentrating - 0 - 1  Moving slowly or fidgety/restless 1 0 - 1  Suicidal thoughts 1 0 - 2  PHQ-9 Score 5 8 - 8  Difficult doing work/chores - Somewhat difficult - Not difficult at all  Some recent data might be hidden   GAD 7 : Generalized Anxiety Score 12/29/2020  Nervous, Anxious, on Edge 1  Control/stop worrying 1  Worry too much - different things 1  Trouble relaxing 1  Restless 1  Easily annoyed or irritable 0  Afraid - awful might happen 1  Total GAD 7 Score 6

## 2020-12-29 NOTE — Progress Notes (Signed)
   GYNECOLOGY OFFICE VISIT NOTE  History:  50 y.o. G0P0000 here today for PAP, Urinary frequency, and STI screening. She denies any abnormal vaginal discharge, bleeding, or other concerns.   The following portions of the patient's history were reviewed and updated as appropriate: allergies, current medications, past family history, past medical history, past social history, past surgical history and problem list.   Health Maintenance:  PAP done today  Review of Systems:  Pertinent items noted in HPI Review of Systems  Constitutional:  Negative for chills.  Gastrointestinal:  Negative for abdominal pain, nausea and vomiting.  Genitourinary:  Positive for frequency and urgency. Negative for flank pain and hematuria.   Objective:  Physical Exam BP (!) 134/99   Pulse 84   Wt (!) 303 lb (137.4 kg)   BMI 46.07 kg/m  Physical Exam Vitals and nursing note reviewed.  Constitutional:      Appearance: Normal appearance.  Cardiovascular:     Rate and Rhythm: Normal rate.     Pulses: Normal pulses.  Pulmonary:     Effort: No respiratory distress.  Abdominal:     Tenderness: There is no abdominal tenderness.  Genitourinary:    General: Normal vulva.     Comments: On speculum exam liletta strings visualized at os, PAP smear collected Neurological:     Mental Status: She is alert.    Labs and Imaging Results for orders placed or performed in visit on 12/29/20 (from the past 168 hour(s))  POCT urinalysis dip (device)   Collection Time: 12/29/20  2:09 PM  Result Value Ref Range   Glucose, UA NEGATIVE NEGATIVE mg/dL   Bilirubin Urine NEGATIVE NEGATIVE   Ketones, ur NEGATIVE NEGATIVE mg/dL   Specific Gravity, Urine 1.015 1.005 - 1.030   Hgb urine dipstick TRACE (A) NEGATIVE   pH 5.5 5.0 - 8.0   Protein, ur NEGATIVE NEGATIVE mg/dL   Urobilinogen, UA 0.2 0.0 - 1.0 mg/dL   Nitrite NEGATIVE NEGATIVE   Leukocytes,Ua TRACE (A) NEGATIVE     Assessment & Plan:  1. Papanicolaou smear  for cervical cancer screening Due for screening, normal previous PAPs - Cytology - PAP( [place])  2. Screening examination for STD (sexually transmitted disease) - HIV Antibody (routine testing w rflx) - Hepatitis C Antibody - Hepatitis B Surface AntiGEN - RPR  3. Burning with urination Patient with dysuria without hematuria and no systemic symptoms. UA with trace leuk without nitrites. Will send culture - Urine Culture  4. Food insecurity - AMBULATORY REFERRAL TO [facility]  5. Vaginal discharge Reports having some vaginal discharge. No itching - Cervicovaginal ancillary only( [place])   Routine preventative health maintenance measures emphasized. Please refer to After Visit Summary for other counseling recommendations.     Total face-to-face time with patient: 30 minutes.  Over 50% of encounter was spent on counseling and coordination of care.  Renard Matter, MD, MPH OB Fellow, [place] for [facility], [place]

## 2020-12-30 LAB — RPR: RPR Ser Ql: NONREACTIVE

## 2020-12-30 LAB — CERVICOVAGINAL ANCILLARY ONLY
Bacterial Vaginitis (gardnerella): POSITIVE — AB
Candida Glabrata: NEGATIVE
Candida Vaginitis: NEGATIVE
Comment: NEGATIVE
Comment: NEGATIVE
Comment: NEGATIVE

## 2020-12-30 LAB — HEPATITIS C ANTIBODY: Hep C Virus Ab: <0.1 s/co ratio (ref 0.0–0.9)

## 2020-12-30 LAB — HIV ANTIBODY (ROUTINE TESTING W REFLEX): HIV Screen 4th Generation wRfx: NONREACTIVE

## 2020-12-30 LAB — HEPATITIS B SURFACE ANTIGEN: Hepatitis B Surface Ag: NEGATIVE

## 2020-12-31 LAB — URINE CULTURE

## 2021-01-01 LAB — CYTOLOGY - PAP
Chlamydia: NEGATIVE
Comment: NEGATIVE
Comment: NEGATIVE
Comment: NEGATIVE
Comment: NORMAL
Diagnosis: NEGATIVE
Diagnosis: REACTIVE
High risk HPV: NEGATIVE
Neisseria Gonorrhea: NEGATIVE
Trichomonas: POSITIVE — AB

## 2021-01-04 ENCOUNTER — Telehealth: Payer: Self-pay

## 2021-01-04 NOTE — Telephone Encounter (Signed)
-----   Message from Renard Matter, MD sent at 01/02/2021  1:52 AM EDT ----- Regarding: Request to call patient with abnormal lab results Hi Could you call this patient and discuss with her the following: 1. Negative Hep C, Hep B s ag, RPR and urine culture 2. PAP did not show any abnormal cells.  3. Her PAP did show she has Trichomonas. Could you confirm what pharmacy she wants her treatment sent to and any allergies she has? She will need to take the antibiotic Flagyl twice a day for 7 days. She should also discuss the results with any partners and they should get treated in order to prevent re-infection. 4. Her swab also showed that she has BV - which will also be treated by same antibiotic as above.  Thank you! Dr. Cy Blamer ----- Message ----- From: Interface, [facility] Lab Results In Sent: 12/30/2020   6:37 AM EDT To: Renard Matter, MD

## 2021-01-04 NOTE — Telephone Encounter (Signed)
Called Pt to advise that she tested + for Trich and BV and that Flagyl was sent to pharmacy on file. Pt stated that it was no way but did advise to have partner Tested and Tx, no sex until 2 weeks after last person is Treated. Pt verbalized understanding.

## 2021-01-05 ENCOUNTER — Other Ambulatory Visit: Payer: Self-pay | Admitting: Family Medicine

## 2021-01-05 MED ORDER — METRONIDAZOLE 500 MG PO TABS: 500.0000 mg | ORAL_TABLET | Freq: Two times a day (BID) | ORAL | 0 refills | Status: AC

## 2021-01-05 NOTE — Progress Notes (Signed)
Sent Metronidazole '500mg'$  BID to patient's pharmacy on file.  Renard Matter, MD, MPH OB Fellow, [facility]'

## 2021-01-11 DIAGNOSIS — M47816 Spondylosis without myelopathy or radiculopathy, lumbar region: Secondary | ICD-10-CM | POA: Diagnosis not present

## 2021-02-03 DIAGNOSIS — M5116 Intervertebral disc disorders with radiculopathy, lumbar region: Secondary | ICD-10-CM | POA: Diagnosis not present

## 2021-02-17 DIAGNOSIS — M5116 Intervertebral disc disorders with radiculopathy, lumbar region: Secondary | ICD-10-CM | POA: Diagnosis not present

## 2021-02-17 DIAGNOSIS — M47816 Spondylosis without myelopathy or radiculopathy, lumbar region: Secondary | ICD-10-CM | POA: Diagnosis not present

## 2021-02-17 DIAGNOSIS — M4316 Spondylolisthesis, lumbar region: Secondary | ICD-10-CM | POA: Diagnosis not present

## 2021-02-22 DIAGNOSIS — G8929 Other chronic pain: Secondary | ICD-10-CM | POA: Diagnosis not present

## 2021-02-22 DIAGNOSIS — D508 Other iron deficiency anemias: Secondary | ICD-10-CM | POA: Diagnosis not present

## 2021-02-22 DIAGNOSIS — G4733 Obstructive sleep apnea (adult) (pediatric): Secondary | ICD-10-CM | POA: Diagnosis not present

## 2021-02-22 DIAGNOSIS — I10 Essential (primary) hypertension: Secondary | ICD-10-CM | POA: Diagnosis not present

## 2021-02-22 DIAGNOSIS — G629 Polyneuropathy, unspecified: Secondary | ICD-10-CM | POA: Diagnosis not present

## 2021-02-22 DIAGNOSIS — R1013 Epigastric pain: Secondary | ICD-10-CM | POA: Diagnosis not present

## 2021-02-22 DIAGNOSIS — R600 Localized edema: Secondary | ICD-10-CM | POA: Diagnosis not present

## 2021-02-22 DIAGNOSIS — R7303 Prediabetes: Secondary | ICD-10-CM | POA: Diagnosis not present

## 2021-02-22 DIAGNOSIS — F329 Major depressive disorder, single episode, unspecified: Secondary | ICD-10-CM | POA: Diagnosis not present

## 2021-02-22 DIAGNOSIS — M5442 Lumbago with sciatica, left side: Secondary | ICD-10-CM | POA: Diagnosis not present

## 2021-02-22 DIAGNOSIS — N1831 Chronic kidney disease, stage 3a: Secondary | ICD-10-CM | POA: Diagnosis not present

## 2021-03-03 DIAGNOSIS — M5116 Intervertebral disc disorders with radiculopathy, lumbar region: Secondary | ICD-10-CM | POA: Diagnosis not present

## 2021-03-03 DIAGNOSIS — M4316 Spondylolisthesis, lumbar region: Secondary | ICD-10-CM | POA: Diagnosis not present

## 2021-03-03 DIAGNOSIS — M47816 Spondylosis without myelopathy or radiculopathy, lumbar region: Secondary | ICD-10-CM | POA: Diagnosis not present

## 2021-03-03 DIAGNOSIS — Z6841 Body Mass Index (BMI) 40.0 and over, adult: Secondary | ICD-10-CM | POA: Diagnosis not present

## 2021-03-15 DIAGNOSIS — Z0000 Encounter for general adult medical examination without abnormal findings: Secondary | ICD-10-CM | POA: Diagnosis not present

## 2021-03-15 DIAGNOSIS — I10 Essential (primary) hypertension: Secondary | ICD-10-CM | POA: Diagnosis not present

## 2021-03-15 DIAGNOSIS — G4733 Obstructive sleep apnea (adult) (pediatric): Secondary | ICD-10-CM | POA: Diagnosis not present

## 2021-03-15 DIAGNOSIS — Z0001 Encounter for general adult medical examination with abnormal findings: Secondary | ICD-10-CM | POA: Diagnosis not present

## 2021-03-15 DIAGNOSIS — F329 Major depressive disorder, single episode, unspecified: Secondary | ICD-10-CM | POA: Diagnosis not present

## 2021-03-15 DIAGNOSIS — G8929 Other chronic pain: Secondary | ICD-10-CM | POA: Diagnosis not present

## 2021-03-15 DIAGNOSIS — N1831 Chronic kidney disease, stage 3a: Secondary | ICD-10-CM | POA: Diagnosis not present

## 2021-03-15 DIAGNOSIS — R600 Localized edema: Secondary | ICD-10-CM | POA: Diagnosis not present

## 2021-03-15 DIAGNOSIS — M5442 Lumbago with sciatica, left side: Secondary | ICD-10-CM | POA: Diagnosis not present

## 2021-03-15 DIAGNOSIS — R7303 Prediabetes: Secondary | ICD-10-CM | POA: Diagnosis not present

## 2021-03-15 DIAGNOSIS — D508 Other iron deficiency anemias: Secondary | ICD-10-CM | POA: Diagnosis not present

## 2021-03-15 DIAGNOSIS — R1013 Epigastric pain: Secondary | ICD-10-CM | POA: Diagnosis not present

## 2021-03-15 DIAGNOSIS — G629 Polyneuropathy, unspecified: Secondary | ICD-10-CM | POA: Diagnosis not present

## 2021-03-22 ENCOUNTER — Other Ambulatory Visit: Payer: Self-pay | Admitting: Pain Medicine

## 2021-03-22 DIAGNOSIS — M5416 Radiculopathy, lumbar region: Secondary | ICD-10-CM | POA: Diagnosis not present

## 2021-03-22 DIAGNOSIS — M546 Pain in thoracic spine: Secondary | ICD-10-CM

## 2021-03-22 DIAGNOSIS — M545 Low back pain, unspecified: Secondary | ICD-10-CM | POA: Diagnosis not present

## 2021-03-22 DIAGNOSIS — G8929 Other chronic pain: Secondary | ICD-10-CM | POA: Diagnosis not present

## 2021-03-24 DIAGNOSIS — F330 Major depressive disorder, recurrent, mild: Secondary | ICD-10-CM | POA: Diagnosis not present

## 2021-04-12 ENCOUNTER — Other Ambulatory Visit: Payer: Medicare Other

## 2021-04-14 DIAGNOSIS — R112 Nausea with vomiting, unspecified: Secondary | ICD-10-CM | POA: Diagnosis not present

## 2021-04-14 DIAGNOSIS — I10 Essential (primary) hypertension: Secondary | ICD-10-CM | POA: Diagnosis not present

## 2021-04-14 DIAGNOSIS — R600 Localized edema: Secondary | ICD-10-CM | POA: Diagnosis not present

## 2021-04-14 DIAGNOSIS — G629 Polyneuropathy, unspecified: Secondary | ICD-10-CM | POA: Diagnosis not present

## 2021-04-14 DIAGNOSIS — G4733 Obstructive sleep apnea (adult) (pediatric): Secondary | ICD-10-CM | POA: Diagnosis not present

## 2021-04-14 DIAGNOSIS — R1013 Epigastric pain: Secondary | ICD-10-CM | POA: Diagnosis not present

## 2021-04-14 DIAGNOSIS — G8929 Other chronic pain: Secondary | ICD-10-CM | POA: Diagnosis not present

## 2021-04-14 DIAGNOSIS — F329 Major depressive disorder, single episode, unspecified: Secondary | ICD-10-CM | POA: Diagnosis not present

## 2021-04-14 DIAGNOSIS — D508 Other iron deficiency anemias: Secondary | ICD-10-CM | POA: Diagnosis not present

## 2021-04-14 DIAGNOSIS — M5442 Lumbago with sciatica, left side: Secondary | ICD-10-CM | POA: Diagnosis not present

## 2021-04-14 DIAGNOSIS — N1831 Chronic kidney disease, stage 3a: Secondary | ICD-10-CM | POA: Diagnosis not present

## 2021-04-14 DIAGNOSIS — R7303 Prediabetes: Secondary | ICD-10-CM | POA: Diagnosis not present

## 2021-06-28 ENCOUNTER — Other Ambulatory Visit: Payer: Self-pay | Admitting: Internal Medicine

## 2021-06-28 DIAGNOSIS — Z1231 Encounter for screening mammogram for malignant neoplasm of breast: Secondary | ICD-10-CM

## 2021-07-02 DIAGNOSIS — G8929 Other chronic pain: Secondary | ICD-10-CM | POA: Diagnosis not present

## 2021-07-02 DIAGNOSIS — M5442 Lumbago with sciatica, left side: Secondary | ICD-10-CM | POA: Diagnosis not present

## 2021-07-02 DIAGNOSIS — G4733 Obstructive sleep apnea (adult) (pediatric): Secondary | ICD-10-CM | POA: Diagnosis not present

## 2021-07-02 DIAGNOSIS — G629 Polyneuropathy, unspecified: Secondary | ICD-10-CM | POA: Diagnosis not present

## 2021-07-02 DIAGNOSIS — D508 Other iron deficiency anemias: Secondary | ICD-10-CM | POA: Diagnosis not present

## 2021-07-02 DIAGNOSIS — R7303 Prediabetes: Secondary | ICD-10-CM | POA: Diagnosis not present

## 2021-07-02 DIAGNOSIS — F329 Major depressive disorder, single episode, unspecified: Secondary | ICD-10-CM | POA: Diagnosis not present

## 2021-07-02 DIAGNOSIS — R600 Localized edema: Secondary | ICD-10-CM | POA: Diagnosis not present

## 2021-07-02 DIAGNOSIS — N1831 Chronic kidney disease, stage 3a: Secondary | ICD-10-CM | POA: Diagnosis not present

## 2021-07-02 DIAGNOSIS — R1013 Epigastric pain: Secondary | ICD-10-CM | POA: Diagnosis not present

## 2021-07-02 DIAGNOSIS — I10 Essential (primary) hypertension: Secondary | ICD-10-CM | POA: Diagnosis not present

## 2021-07-06 ENCOUNTER — Ambulatory Visit: Payer: Medicare Other | Admitting: Family

## 2021-07-06 ENCOUNTER — Encounter: Payer: Self-pay | Admitting: Orthopedic Surgery

## 2021-07-06 ENCOUNTER — Ambulatory Visit (INDEPENDENT_AMBULATORY_CARE_PROVIDER_SITE_OTHER): Payer: Medicare Other | Admitting: Orthopedic Surgery

## 2021-07-06 ENCOUNTER — Other Ambulatory Visit: Payer: Self-pay

## 2021-07-06 DIAGNOSIS — I87331 Chronic venous hypertension (idiopathic) with ulcer and inflammation of right lower extremity: Secondary | ICD-10-CM | POA: Diagnosis not present

## 2021-07-06 DIAGNOSIS — L97919 Non-pressure chronic ulcer of unspecified part of right lower leg with unspecified severity: Secondary | ICD-10-CM

## 2021-07-06 NOTE — Progress Notes (Signed)
Office Visit Note   Patient: Alice [name]           Date of Birth: 1970-10-08           MRN: [id number] Visit Date: 07/06/2021              Requested by: Benito Mccreedy, MD [address] [place],  [place] [address] PCP: Benito Mccreedy, MD  Chief Complaint  Patient presents with   Right Leg - Follow-up      HPI: Patient is a 51 year old woman who has had chronic venous insufficiency ulceration right leg.  Patient has undergone skin grafting compression and still has a persistent ulcer.  Most recent irrigation debridement was in May 2020.  Patient complains of pain at the area of the wound she is wearing knee-high compression socks.  Assessment & Plan: Visit Diagnoses:  1. 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  Outpatient surgery with discharge with a Praveena plus portable wound VAC pump.  Follow-Up Instructions: Return in about 3 months (around 10/03/2021).   Ortho Exam  Patient is alert, oriented, no adenopathy, well-dressed, normal affect, normal respiratory effort. Examination patient still has some swelling in the right leg with brawny skin color changes.  She has a persistent venous insufficiency ulcer medial right calf.  The wound measures 3 x 5 cm and is 1 mm deep.  There is healthy granulation tissue in the wound bed.  With approximately 50% fibrinous exudative tissue.  There is no dermatitis or cellulitis no drainage.  Imaging: No results found.   Labs: Lab Results  Component Value Date   HGBA1C 5.2 09/15/2020   REPTSTATUS 10/06/2018 FINAL 10/05/2018   GRAMSTAIN  09/28/2018    FEW WBC PRESENT, PREDOMINANTLY PMN FEW GRAM POSITIVE COCCI IN PAIRS FEW GRAM POSITIVE RODS    CULT 20,000 COLONIES/mL YEAST (A) 10/05/2018   LABORGA PROTEUS MIRABILIS 09/28/2018   LABORGA STAPHYLOCOCCUS  CAPRAE 09/28/2018   LABORGA SERRATIA MARCESCENS 09/28/2018     Lab Results  Component Value Date   ALBUMIN 4.2 09/15/2020   ALBUMIN 3.4 (L) 05/12/2019   ALBUMIN 2.5 (L) 10/08/2018    Lab Results  Component Value Date   MG 1.9 08/19/2019   MG 1.8 12/29/2016   MG 1.9 12/28/2016   Lab Results  Component Value Date   VD25OH 12.0 (L) 09/15/2020    No results found for: PREALBUMIN CBC EXTENDED Latest Ref Rng & Units 09/15/2020 08/19/2019 05/12/2019  WBC 3.4 - 10.8 x10E3/uL 4.5 7.5 5.3  RBC 3.77 - 5.28 x10E6/uL 4.83 4.37 5.20(H)  HGB 11.1 - 15.9 g/dL 13.2 12.3 14.4  HCT 34.0 - 46.6 % 40.0 36.7 43.1  PLT 150 - 400 K/uL - 236 240  NEUTROABS 1.4 - 7.0 x10E3/uL 2.5 5.2 -  LYMPHSABS 0.7 - 3.1 x10E3/uL 1.3 1.5 -     There is no height or weight on file to calculate BMI.  Orders:  No orders of the defined types were placed in this encounter.  No orders of the defined types were placed in this encounter.    Procedures: No procedures performed  Clinical Data: No additional findings.  ROS:  All other systems negative, except as noted in the HPI. Review of Systems  Objective: Vital Signs: There were no vitals taken for this visit.  Specialty Comments:  No specialty comments available.  PMFS History: Patient Active Problem List   Diagnosis Date Noted   SOB (shortness of breath) on exertion 09/15/2020   Vitamin D deficiency 09/15/2020   Hyperglycemia 09/15/2020   Subtherapeutic international normalized ratio (INR)    Postoperative pain    Hypoalbuminemia    Acute on chronic kidney failure (HCC)    Acute blood loss anemia    Benign essential HTN    Other fatigue 10/05/2018   Chronic ulcer of right leg, with fat layer exposed ([place]) 09/28/2018   Chronic ulcer of leg, right, with necrosis of muscle ([place])    Iron deficiency anemia 01/29/2018   Pulmonary embolus ([place]) 01/09/2018   Encounter for therapeutic drug monitoring 01/09/2018   Severe malnutrition ([place])  08/24/2017   Acute kidney injury superimposed on CKD ([place]) 12/28/2016   Hx of skin graft 08/01/2016   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 ([place]) 07/25/2016   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 ([place]) 07/05/2016   Trichomonal infection 11/24/2015   Abdominal pain 05/02/2015   Symptomatic cholelithiasis 04/16/2015   Acute bilateral upper abdominal pain 04/16/2015   Microcytic anemia 05/18/2013   Hypotension, unspecified 05/17/2013   Anemia due to stage 3b chronic kidney disease ([place]) 05/17/2013   Hypokalemia 05/17/2013   Anal fissure 02/06/2013   Anal skin tag 02/06/2013   ALLERGIC RHINITIS 03/05/2010   LOW BACK PAIN SYNDROME 12/13/2007   OBESITY, MORBID 12/02/2006   HTN (hypertension) 12/02/2006   History of pulmonary embolism 12/02/2006   History of DVT (deep vein thrombosis) 12/02/2006   SYNDROME, POSTPHLEBITIC W/ULCER & INFLM 12/02/2006   GASTROESOPHAGEAL REFLUX DISEASE 12/02/2006   OSA (obstructive sleep apnea) 12/02/2006   Past Medical History:  Diagnosis Date   Anemia    Anxiety    Arthritis    Chest pain    Chronic bronchitis (HCC)    Chronic upper back pain    Depression    Depression    DVT (deep venous thrombosis) (HCC)    BLE   GERD (gastroesophageal reflux disease)    Headache    "weekly" (04/16/2015)   Hyperlipidemia    Hypertension    Joint pain    Migraine    "monthly" (04/16/2015)   Morbid obesity with BMI of 50.0-59.9, adult ([place])    Obesity    Other fatigue    Pneumonia 2014   Pulmonary embolism (HCC)    Sinusitis nasal    Sleep apnea    "I'm suppose to wear a mask but I don't" (04/16/2015)   SOB (shortness of breath) on exertion    Varicose veins of right lower extremity    Venous stasis of lower extremity    right   Wears dentures    Wears glasses     Family History  Problem Relation Age of Onset   Kidney disease Mother        kidney transplant   Diabetes Mother     Hyperlipidemia Mother    Hypertension Mother    Stroke Mother    Diabetes Father    Hypertension Father    Hyperlipidemia Father    Stroke Father    Kidney disease Father    Schizophrenia Father    Alcoholism Father     Past Surgical History:  Procedure Laterality Date   CHOLECYSTECTOMY N/A 04/18/2015   Procedure: LAPAROSCOPIC CHOLECYSTECTOMY;  Surgeon: Ralene Ok, MD;  Location: [facility];  Service: General;  Laterality: N/A;   HYSTEROSCOPY WITH D & C  12/24/2001   Archie Endo 09/28/2010   I & D EXTREMITY Right 07/25/2016   Procedure: IRRIGATION AND DEBRIDEMENT RIGHT LEG ULCER, APPLY VERAFLO VAC;  Surgeon: [name], MD;  Location: [facility];  Service: Orthopedics;  Laterality: Right;   I & D EXTREMITY Right 09/28/2018   Procedure: DEBRIDEMENT RIGHT LOWER LEG WITH PLACEMENT WITH PLACEMENT OF SKIN GRAFT AND VAC;  Surgeon: [name], MD;  Location: [facility];  Service: Orthopedics;  Laterality: Right;   I & D EXTREMITY Right 10/03/2018   Procedure: REPEAT IRRIGATION AND DEBRIDEMENT RIGHT LOWER LEG, VAC PLACEMENT;  Surgeon: [name], MD;  Location: [facility];  Service: Orthopedics;  Laterality: Right;   INCISE AND DRAIN ABCESS Right 07/14/2016   INCISION AND DRAINAGE Right 09/10/2008   leg:  skin and soft tissue and muscle/notes 09/15/2010   INCISION AND DRAINAGE Right 01/01/2008   Chronic venous stasis insufficiency ulcer,/notes 09/14/2010/   INCISION AND DRAINAGE Right 12/6379   calf w/application wound vac/notes 07/20/2010   INCISION AND DRAINAGE Right 07/25/2016   IRRIGATION AND DEBRIDEMENT RIGHT LEG ULCER,   LAPAROSCOPIC GASTRIC BYPASS  ~ 2007   MULTIPLE TOOTH EXTRACTIONS     SKIN GRAFT SPLIT THICKNESS LEG / FOOT Right 07/25/2016   LEG   SKIN SPLIT GRAFT Right 07/27/2016   Procedure: SKIN GRAFT RIGHT LEG WITH THERASKIN APPLICATION;  Surgeon: [name], MD;  Location: [facility];  Service: Orthopedics;  Laterality: Right;   TONSILLECTOMY     Social History   Occupational History    Occupation: unemployed  Tobacco Use   Smoking status: Never   Smokeless tobacco: Never  Vaping Use   Vaping Use: Never used  Substance and Sexual Activity   Alcohol use: No   Drug use: No   Sexual activity: Yes    Partners: Male    Birth control/protection: I.U.D.

## 2021-07-07 ENCOUNTER — Ambulatory Visit: Payer: Medicare Other | Admitting: Family

## 2021-07-14 DIAGNOSIS — I129 Hypertensive chronic kidney disease with stage 1 through stage 4 chronic kidney disease, or unspecified chronic kidney disease: Secondary | ICD-10-CM | POA: Diagnosis not present

## 2021-07-14 DIAGNOSIS — D631 Anemia in chronic kidney disease: Secondary | ICD-10-CM | POA: Diagnosis not present

## 2021-07-14 DIAGNOSIS — N2581 Secondary hyperparathyroidism of renal origin: Secondary | ICD-10-CM | POA: Diagnosis not present

## 2021-07-14 DIAGNOSIS — N184 Chronic kidney disease, stage 4 (severe): Secondary | ICD-10-CM | POA: Diagnosis not present

## 2021-07-14 DIAGNOSIS — N1831 Chronic kidney disease, stage 3a: Secondary | ICD-10-CM | POA: Diagnosis not present

## 2021-07-14 DIAGNOSIS — R809 Proteinuria, unspecified: Secondary | ICD-10-CM | POA: Diagnosis not present

## 2021-07-15 ENCOUNTER — Ambulatory Visit: Payer: Medicare Other

## 2021-07-15 ENCOUNTER — Other Ambulatory Visit: Payer: Self-pay | Admitting: Nephrology

## 2021-07-15 DIAGNOSIS — I129 Hypertensive chronic kidney disease with stage 1 through stage 4 chronic kidney disease, or unspecified chronic kidney disease: Secondary | ICD-10-CM

## 2021-07-15 DIAGNOSIS — N184 Chronic kidney disease, stage 4 (severe): Secondary | ICD-10-CM

## 2021-07-15 DIAGNOSIS — R809 Proteinuria, unspecified: Secondary | ICD-10-CM

## 2021-07-15 DIAGNOSIS — N189 Chronic kidney disease, unspecified: Secondary | ICD-10-CM

## 2021-07-15 DIAGNOSIS — D631 Anemia in chronic kidney disease: Secondary | ICD-10-CM

## 2021-07-15 DIAGNOSIS — N2581 Secondary hyperparathyroidism of renal origin: Secondary | ICD-10-CM

## 2021-07-16 ENCOUNTER — Ambulatory Visit
Admission: RE | Admit: 2021-07-16 | Discharge: 2021-07-16 | Disposition: A | Discharge status: Home / Self Care | Payer: Medicare Other | Source: Ambulatory Visit | Attending: Nephrology | Admitting: Nephrology

## 2021-07-16 DIAGNOSIS — I129 Hypertensive chronic kidney disease with stage 1 through stage 4 chronic kidney disease, or unspecified chronic kidney disease: Secondary | ICD-10-CM

## 2021-07-16 DIAGNOSIS — N184 Chronic kidney disease, stage 4 (severe): Secondary | ICD-10-CM | POA: Diagnosis not present

## 2021-07-16 DIAGNOSIS — N189 Chronic kidney disease, unspecified: Secondary | ICD-10-CM

## 2021-07-16 DIAGNOSIS — N2581 Secondary hyperparathyroidism of renal origin: Secondary | ICD-10-CM

## 2021-07-16 DIAGNOSIS — R809 Proteinuria, unspecified: Secondary | ICD-10-CM

## 2021-07-21 ENCOUNTER — Ambulatory Visit
Admission: RE | Admit: 2021-07-21 | Discharge: 2021-07-21 | Disposition: A | Discharge status: Home / Self Care | Payer: Medicare Other | Source: Ambulatory Visit | Attending: Internal Medicine | Admitting: Internal Medicine

## 2021-07-21 ENCOUNTER — Encounter: Payer: Self-pay | Admitting: Oncology

## 2021-07-21 DIAGNOSIS — Z1231 Encounter for screening mammogram for malignant neoplasm of breast: Secondary | ICD-10-CM

## 2021-09-15 DIAGNOSIS — R1013 Epigastric pain: Secondary | ICD-10-CM | POA: Diagnosis not present

## 2021-09-15 DIAGNOSIS — M5442 Lumbago with sciatica, left side: Secondary | ICD-10-CM | POA: Diagnosis not present

## 2021-09-15 DIAGNOSIS — R7303 Prediabetes: Secondary | ICD-10-CM | POA: Diagnosis not present

## 2021-09-15 DIAGNOSIS — G4733 Obstructive sleep apnea (adult) (pediatric): Secondary | ICD-10-CM | POA: Diagnosis not present

## 2021-09-15 DIAGNOSIS — D508 Other iron deficiency anemias: Secondary | ICD-10-CM | POA: Diagnosis not present

## 2021-09-15 DIAGNOSIS — I10 Essential (primary) hypertension: Secondary | ICD-10-CM | POA: Diagnosis not present

## 2021-09-15 DIAGNOSIS — G629 Polyneuropathy, unspecified: Secondary | ICD-10-CM | POA: Diagnosis not present

## 2021-09-15 DIAGNOSIS — N1831 Chronic kidney disease, stage 3a: Secondary | ICD-10-CM | POA: Diagnosis not present

## 2021-09-15 DIAGNOSIS — G8929 Other chronic pain: Secondary | ICD-10-CM | POA: Diagnosis not present

## 2021-10-14 DIAGNOSIS — Z0000 Encounter for general adult medical examination without abnormal findings: Secondary | ICD-10-CM | POA: Diagnosis not present

## 2021-10-14 DIAGNOSIS — G4733 Obstructive sleep apnea (adult) (pediatric): Secondary | ICD-10-CM | POA: Diagnosis not present

## 2021-10-14 DIAGNOSIS — G629 Polyneuropathy, unspecified: Secondary | ICD-10-CM | POA: Diagnosis not present

## 2021-10-14 DIAGNOSIS — R7303 Prediabetes: Secondary | ICD-10-CM | POA: Diagnosis not present

## 2021-10-14 DIAGNOSIS — N1831 Chronic kidney disease, stage 3a: Secondary | ICD-10-CM | POA: Diagnosis not present

## 2021-10-14 DIAGNOSIS — M5442 Lumbago with sciatica, left side: Secondary | ICD-10-CM | POA: Diagnosis not present

## 2021-10-14 DIAGNOSIS — I10 Essential (primary) hypertension: Secondary | ICD-10-CM | POA: Diagnosis not present

## 2021-10-14 DIAGNOSIS — R1013 Epigastric pain: Secondary | ICD-10-CM | POA: Diagnosis not present

## 2021-10-14 DIAGNOSIS — D508 Other iron deficiency anemias: Secondary | ICD-10-CM | POA: Diagnosis not present

## 2021-10-14 DIAGNOSIS — G8929 Other chronic pain: Secondary | ICD-10-CM | POA: Diagnosis not present

## 2021-10-29 DIAGNOSIS — R809 Proteinuria, unspecified: Secondary | ICD-10-CM | POA: Diagnosis not present

## 2021-10-29 DIAGNOSIS — D631 Anemia in chronic kidney disease: Secondary | ICD-10-CM | POA: Diagnosis not present

## 2021-10-29 DIAGNOSIS — N2581 Secondary hyperparathyroidism of renal origin: Secondary | ICD-10-CM | POA: Diagnosis not present

## 2021-10-29 DIAGNOSIS — I129 Hypertensive chronic kidney disease with stage 1 through stage 4 chronic kidney disease, or unspecified chronic kidney disease: Secondary | ICD-10-CM | POA: Diagnosis not present

## 2021-10-29 DIAGNOSIS — N184 Chronic kidney disease, stage 4 (severe): Secondary | ICD-10-CM | POA: Diagnosis not present

## 2021-11-02 ENCOUNTER — Telehealth: Payer: Self-pay | Admitting: Orthopedic Surgery

## 2021-11-02 NOTE — Telephone Encounter (Signed)
Alice [name], she was seen 06/2021 and surgery was discussed but pt wasn't ready. She is now and would like to proceed. Can you please get her scheduled? Thank you!

## 2021-11-02 NOTE — Telephone Encounter (Signed)
Pt called and is ready to schedule surgery

## 2021-11-04 ENCOUNTER — Encounter: Payer: Self-pay | Admitting: Oncology

## 2021-11-09 ENCOUNTER — Encounter (HOSPITAL_COMMUNITY): Payer: Self-pay | Admitting: Orthopedic Surgery

## 2021-11-09 ENCOUNTER — Other Ambulatory Visit: Payer: Self-pay

## 2021-11-09 NOTE — Pre-Procedure Instructions (Signed)
[facility] - [place], [place] - [address] [place] [address] Phone: [phone] Fax: [phone]  [id number] [place], [place] - [address] AT [facility] [address] [place] [address] Phone: [phone] Fax: [phone]   PCP -Jackie Plum, MD Cardiologist -Lars Masson, MD  EKG - DOS ECHO - 06/27/17  CPAP - wears it "here and there'  Blood Thinner Instructions: Eliquis last dose: 11/08/21. Will office to let them know  ERAS Protcol - Clears until 0430  COVID TEST- N  Anesthesia review: N  Patient verbally denies any shortness of breath, fever, cough and chest pain during phone call   -------------  SDW INSTRUCTIONS given:  Your procedure is scheduled on 11/10/21.  Report to [facility]tr[facility] "A" at 0530 A.M., and check in at the Admitting office.  Call this number if you have problems the morning of surgery:  [phone]   Remember:  Do not eat after midnight the night before your surgery  You may drink clear liquids until 0430 the morning of your surgery.   Clear liquids allowed are: Water, Non-Citrus Juices (without pulp), Carbonated Beverages, Clear Tea, Black Coffee Only, and Gatorade    Take these medicines the morning of surgery with A SIP OF WATER  amLODipine (NORVASC)  gabapentin (NEURONTIN) pantoprazole (PROTONIX) acetaminophen (TYLENOL)-if needed albuterol (PROVENTIL HFA;VENTOLIN HFA)-if needed nitroGLYCERIN (NITROSTAT)-if needed tiZANidine (ZANAFLEX)-if needed   As of today, STOP taking any Aspirin (unless otherwise instructed by your surgeon) Aleve, Naproxen, Ibuprofen, Motrin, Advil, Goody's, BC's, all herbal medications, fish oil, and all vitamins.                      Do not wear jewelry, make up, or nail polish            Do not wear lotions, powders, perfumes/colognes, or deodorant.            Do not shave 48 hours prior to surgery.  Men may shave face  and neck.            Do not bring valuables to the hospital.            [facility] is not responsible for any belongings or valuables.  Do NOT Smoke (Tobacco/Vaping) 24 hours prior to your procedure If you use a CPAP at night, you may bring all equipment for your overnight stay.   Contacts, glasses, dentures or bridgework may not be worn into surgery.      For patients admitted to the hospital, discharge time will be determined by your treatment team.   Patients discharged the day of surgery will not be allowed to drive home, and someone needs to stay with them for 24 hours.    Special instructions:   [facility]- Preparing For Surgery  Before surgery, you can play an important role. Because skin is not sterile, your skin needs to be as free of germs as possible. You can reduce the number of germs on your skin by washing with CHG (chlorahexidine gluconate) Soap before surgery.  CHG is an antiseptic cleaner which kills germs and bonds with the skin to continue killing germs even after washing.    Oral Hygiene is also important to reduce your risk of infection.  Remember - BRUSH YOUR TEETH THE MORNING OF SURGERY WITH YOUR REGULAR TOOTHPASTE  Please do not use if you have an allergy to CHG or antibacterial  soaps. If your skin becomes reddened/irritated stop using the CHG.  Do not shave (including legs and underarms) for at least 48 hours prior to first CHG shower. It is OK to shave your face.  Please follow these instructions carefully.   Shower the NIGHT BEFORE SURGERY and the MORNING OF SURGERY with DIAL Soap.   Pat yourself dry with a CLEAN TOWEL.  Wear CLEAN PAJAMAS to bed the night before surgery  Place CLEAN SHEETS on your bed the night of your first shower and DO NOT SLEEP WITH PETS.   Day of Surgery: Please shower morning of surgery  Wear Clean/Comfortable clothing the morning of surgery Do not apply any deodorants/lotions.   Remember to brush your teeth WITH YOUR  REGULAR TOOTHPASTE.   Questions were answered. Patient verbalized understanding of instructions.

## 2021-11-10 ENCOUNTER — Ambulatory Visit (HOSPITAL_COMMUNITY): Payer: Medicare Other | Admitting: Certified Registered Nurse Anesthetist

## 2021-11-10 ENCOUNTER — Ambulatory Visit (HOSPITAL_BASED_OUTPATIENT_CLINIC_OR_DEPARTMENT_OTHER): Payer: Medicare Other | Admitting: Certified Registered Nurse Anesthetist

## 2021-11-10 ENCOUNTER — Other Ambulatory Visit: Payer: Self-pay

## 2021-11-10 ENCOUNTER — Encounter (HOSPITAL_COMMUNITY)
Admission: RE | Disposition: A | Discharge status: Home / Self Care | Payer: Self-pay | Source: Home / Self Care | Attending: Orthopedic Surgery

## 2021-11-10 ENCOUNTER — Ambulatory Visit (HOSPITAL_COMMUNITY)
Admission: RE | Admit: 2021-11-10 | Discharge: 2021-11-11 | Disposition: A | Discharge status: Home / Self Care | Payer: Medicare Other | Attending: Orthopedic Surgery | Admitting: Orthopedic Surgery

## 2021-11-10 ENCOUNTER — Encounter (HOSPITAL_COMMUNITY): Payer: Self-pay | Admitting: Orthopedic Surgery

## 2021-11-10 DIAGNOSIS — L97919 Non-pressure chronic ulcer of unspecified part of right lower leg with unspecified severity: Secondary | ICD-10-CM

## 2021-11-10 DIAGNOSIS — I10 Essential (primary) hypertension: Secondary | ICD-10-CM | POA: Diagnosis not present

## 2021-11-10 DIAGNOSIS — G4730 Sleep apnea, unspecified: Secondary | ICD-10-CM | POA: Diagnosis not present

## 2021-11-10 DIAGNOSIS — I87331 Chronic venous hypertension (idiopathic) with ulcer and inflammation of right lower extremity: Secondary | ICD-10-CM | POA: Insufficient documentation

## 2021-11-10 DIAGNOSIS — I872 Venous insufficiency (chronic) (peripheral): Secondary | ICD-10-CM | POA: Insufficient documentation

## 2021-11-10 DIAGNOSIS — E119 Type 2 diabetes mellitus without complications: Secondary | ICD-10-CM | POA: Insufficient documentation

## 2021-11-10 DIAGNOSIS — L02415 Cutaneous abscess of right lower limb: Secondary | ICD-10-CM

## 2021-11-10 DIAGNOSIS — Z6841 Body Mass Index (BMI) 40.0 and over, adult: Secondary | ICD-10-CM | POA: Diagnosis not present

## 2021-11-10 DIAGNOSIS — Z945 Skin transplant status: Secondary | ICD-10-CM

## 2021-11-10 HISTORY — PX: APPLICATION OF WOUND VAC: SHX5189

## 2021-11-10 HISTORY — PX: I & D EXTREMITY: SHX5045

## 2021-11-10 LAB — CBC
HCT: 31.8 % — ABNORMAL LOW (ref 36.0–46.0)
Hemoglobin: 10.6 g/dL — ABNORMAL LOW (ref 12.0–15.0)
MCH: 28.1 pg (ref 26.0–34.0)
MCHC: 33.3 g/dL (ref 30.0–36.0)
MCV: 84.4 fL (ref 80.0–100.0)
Platelets: 195 10*3/uL (ref 150–400)
RBC: 3.77 MIL/uL — ABNORMAL LOW (ref 3.87–5.11)
RDW: 14.6 % (ref 11.5–15.5)
WBC: 5.2 10*3/uL (ref 4.0–10.5)
nRBC: 0 % (ref 0.0–0.2)

## 2021-11-10 LAB — BASIC METABOLIC PANEL
Anion gap: 10 (ref 5–15)
BUN: 41 mg/dL — ABNORMAL HIGH (ref 6–20)
CO2: 21 mmol/L — ABNORMAL LOW (ref 22–32)
Calcium: 8.3 mg/dL — ABNORMAL LOW (ref 8.9–10.3)
Chloride: 109 mmol/L (ref 98–111)
Creatinine, Ser: 3.24 mg/dL — ABNORMAL HIGH (ref 0.44–1.00)
GFR, Estimated: 17 mL/min — ABNORMAL LOW (ref >60)
Glucose, Bld: 96 mg/dL (ref 70–99)
Potassium: 4 mmol/L (ref 3.5–5.1)
Sodium: 140 mmol/L (ref 135–145)

## 2021-11-10 LAB — POCT PREGNANCY, URINE: Preg Test, Ur: NEGATIVE

## 2021-11-10 LAB — GLUCOSE, CAPILLARY: Glucose-Capillary: 91 mg/dL (ref 70–99)

## 2021-11-10 SURGERY — IRRIGATION AND DEBRIDEMENT EXTREMITY
Anesthesia: General | Laterality: Right

## 2021-11-10 MED ORDER — CHLORHEXIDINE GLUCONATE 0.12 % MT SOLN
15.0000 mL | OROMUCOSAL | Status: AC
Start: 2021-11-10 — End: 2021-11-10
  Filled 2021-11-10: qty 15

## 2021-11-10 MED ORDER — CEFAZOLIN IN SODIUM CHLORIDE 3-0.9 GM/100ML-% IV SOLN
3.0000 g | INTRAVENOUS | Status: AC
  Administered 2021-11-10: 3 g via INTRAVENOUS
  Filled 2021-11-10: qty 100

## 2021-11-10 MED ORDER — OXYCODONE HCL 5 MG PO TABS
5.0000 mg | ORAL_TABLET | ORAL | Status: DC | PRN
  Administered 2021-11-10: 10 mg via ORAL

## 2021-11-10 MED ORDER — ONDANSETRON HCL 4 MG PO TABS: 4.0000 mg | ORAL_TABLET | Freq: Four times a day (QID) | ORAL | Status: DC | PRN

## 2021-11-10 MED ORDER — ACETAMINOPHEN 10 MG/ML IV SOLN
INTRAVENOUS | Status: AC
  Administered 2021-11-10: 1000 mg via INTRAVENOUS
  Filled 2021-11-10: qty 100

## 2021-11-10 MED ORDER — DOCUSATE SODIUM 100 MG PO CAPS
100.0000 mg | ORAL_CAPSULE | Freq: Two times a day (BID) | ORAL | Status: DC
Start: 2021-11-10 — End: 2021-11-11
  Administered 2021-11-10 – 2021-11-11 (×2): 100 mg via ORAL
  Filled 2021-11-10 (×2): qty 1

## 2021-11-10 MED ORDER — POLYETHYLENE GLYCOL 3350 17 G PO PACK: 17.0000 g | PACK | Freq: Every day | ORAL | Status: DC | PRN

## 2021-11-10 MED ORDER — CALCIUM CARBONATE ANTACID 500 MG PO CHEW
400.0000 mg | CHEWABLE_TABLET | Freq: Four times a day (QID) | ORAL | Status: DC | PRN
  Administered 2021-11-10: 400 mg via ORAL
  Filled 2021-11-10: qty 2

## 2021-11-10 MED ORDER — ONDANSETRON HCL 4 MG/2ML IJ SOLN
INTRAMUSCULAR | Status: DC | PRN
  Administered 2021-11-10: 4 mg via INTRAVENOUS

## 2021-11-10 MED ORDER — BISACODYL 10 MG RE SUPP: 10.0000 mg | Freq: Every day | RECTAL | Status: DC | PRN

## 2021-11-10 MED ORDER — ACETAMINOPHEN 10 MG/ML IV SOLN: 1000.0000 mg | Freq: Four times a day (QID) | INTRAVENOUS | Status: DC

## 2021-11-10 MED ORDER — AMLODIPINE BESYLATE 10 MG PO TABS
10.0000 mg | ORAL_TABLET | Freq: Every day | ORAL | Status: DC
  Administered 2021-11-11: 10 mg via ORAL
  Filled 2021-11-10: qty 1

## 2021-11-10 MED ORDER — HYDROMORPHONE HCL 1 MG/ML IJ SOLN
INTRAMUSCULAR | Status: AC
  Administered 2021-11-10: 0.5 mg via INTRAVENOUS
  Filled 2021-11-10: qty 1

## 2021-11-10 MED ORDER — FENTANYL CITRATE (PF) 250 MCG/5ML IJ SOLN
INTRAMUSCULAR | Status: AC
  Filled 2021-11-10: qty 5

## 2021-11-10 MED ORDER — LIDOCAINE 2% (20 MG/ML) 5 ML SYRINGE
INTRAMUSCULAR | Status: DC | PRN
  Administered 2021-11-10: 50 mg via INTRAVENOUS

## 2021-11-10 MED ORDER — LACTATED RINGERS IV SOLN: INTRAVENOUS | Status: DC

## 2021-11-10 MED ORDER — PROPOFOL 10 MG/ML IV BOLUS
INTRAVENOUS | Status: DC | PRN
  Administered 2021-11-10: 200 mg via INTRAVENOUS

## 2021-11-10 MED ORDER — METHOCARBAMOL 1000 MG/10ML IJ SOLN: 500.0000 mg | Freq: Four times a day (QID) | INTRAVENOUS | Status: DC | PRN

## 2021-11-10 MED ORDER — MIDAZOLAM HCL 2 MG/2ML IJ SOLN
INTRAMUSCULAR | Status: AC
  Filled 2021-11-10: qty 2

## 2021-11-10 MED ORDER — PROMETHAZINE HCL 25 MG/ML IJ SOLN
6.2500 mg | INTRAMUSCULAR | Status: AC | PRN
  Administered 2021-11-10: 6.25 mg via INTRAVENOUS

## 2021-11-10 MED ORDER — DEXAMETHASONE SODIUM PHOSPHATE 10 MG/ML IJ SOLN
INTRAMUSCULAR | Status: DC | PRN
  Administered 2021-11-10: 8 mg via INTRAVENOUS

## 2021-11-10 MED ORDER — CHLORHEXIDINE GLUCONATE 0.12 % MT SOLN
OROMUCOSAL | Status: AC
  Administered 2021-11-10: 15 mL via OROMUCOSAL
  Filled 2021-11-10: qty 15

## 2021-11-10 MED ORDER — METHOCARBAMOL 500 MG PO TABS
500.0000 mg | ORAL_TABLET | Freq: Four times a day (QID) | ORAL | Status: DC | PRN
  Administered 2021-11-10: 500 mg via ORAL
  Filled 2021-11-10 (×2): qty 1

## 2021-11-10 MED ORDER — HYDROMORPHONE HCL 1 MG/ML IJ SOLN
0.2500 mg | INTRAMUSCULAR | Status: DC | PRN
  Administered 2021-11-10 (×2): 0.5 mg via INTRAVENOUS

## 2021-11-10 MED ORDER — LOSARTAN POTASSIUM 50 MG PO TABS
25.0000 mg | ORAL_TABLET | Freq: Every day | ORAL | Status: DC
  Administered 2021-11-10 – 2021-11-11 (×2): 25 mg via ORAL
  Filled 2021-11-10 (×2): qty 1

## 2021-11-10 MED ORDER — MIDAZOLAM HCL 2 MG/2ML IJ SOLN
INTRAMUSCULAR | Status: DC | PRN
  Administered 2021-11-10: 2 mg via INTRAVENOUS

## 2021-11-10 MED ORDER — FENTANYL CITRATE (PF) 100 MCG/2ML IJ SOLN: 25.0000 ug | INTRAMUSCULAR | Status: DC | PRN

## 2021-11-10 MED ORDER — OXYCODONE HCL 5 MG PO TABS: 10.0000 mg | ORAL_TABLET | ORAL | Status: DC | PRN

## 2021-11-10 MED ORDER — FENTANYL CITRATE (PF) 250 MCG/5ML IJ SOLN
INTRAMUSCULAR | Status: DC | PRN
  Administered 2021-11-10: 150 ug via INTRAVENOUS
  Administered 2021-11-10: 100 ug via INTRAVENOUS

## 2021-11-10 MED ORDER — 0.9 % SODIUM CHLORIDE (POUR BTL) OPTIME
TOPICAL | Status: DC | PRN
  Administered 2021-11-10: 1000 mL

## 2021-11-10 MED ORDER — ACETAMINOPHEN 325 MG PO TABS: 325.0000 mg | ORAL_TABLET | Freq: Four times a day (QID) | ORAL | Status: DC | PRN

## 2021-11-10 MED ORDER — OXYCODONE HCL 5 MG/5ML PO SOLN: 5.0000 mg | Freq: Once | ORAL | Status: DC | PRN

## 2021-11-10 MED ORDER — ONDANSETRON HCL 4 MG/2ML IJ SOLN: 4.0000 mg | Freq: Four times a day (QID) | INTRAMUSCULAR | Status: DC | PRN

## 2021-11-10 MED ORDER — METOCLOPRAMIDE HCL 5 MG PO TABS: 5.0000 mg | ORAL_TABLET | Freq: Three times a day (TID) | ORAL | Status: DC | PRN

## 2021-11-10 MED ORDER — GABAPENTIN 400 MG PO CAPS
400.0000 mg | ORAL_CAPSULE | Freq: Three times a day (TID) | ORAL | Status: DC
  Administered 2021-11-10 – 2021-11-11 (×3): 400 mg via ORAL
  Filled 2021-11-10 (×3): qty 1

## 2021-11-10 MED ORDER — OXYCODONE HCL 5 MG PO TABS: 5.0000 mg | ORAL_TABLET | Freq: Once | ORAL | Status: DC | PRN

## 2021-11-10 MED ORDER — ONDANSETRON HCL 4 MG/2ML IJ SOLN
4.0000 mg | Freq: Four times a day (QID) | INTRAMUSCULAR | Status: DC | PRN
  Administered 2021-11-11: 4 mg via INTRAVENOUS
  Filled 2021-11-10: qty 2

## 2021-11-10 MED ORDER — METHOCARBAMOL 500 MG PO TABS
ORAL_TABLET | ORAL | Status: AC
  Administered 2021-11-10: 500 mg via ORAL
  Filled 2021-11-10: qty 1

## 2021-11-10 MED ORDER — CEFAZOLIN SODIUM-DEXTROSE 2-4 GM/100ML-% IV SOLN
2.0000 g | Freq: Four times a day (QID) | INTRAVENOUS | Status: AC
  Administered 2021-11-10 (×2): 2 g via INTRAVENOUS
  Filled 2021-11-10 (×2): qty 100

## 2021-11-10 MED ORDER — PROMETHAZINE HCL 25 MG/ML IJ SOLN
INTRAMUSCULAR | Status: AC
  Administered 2021-11-10: 6.25 mg via INTRAVENOUS
  Filled 2021-11-10: qty 1

## 2021-11-10 MED ORDER — APIXABAN 2.5 MG PO TABS
2.5000 mg | ORAL_TABLET | Freq: Two times a day (BID) | ORAL | Status: DC
  Administered 2021-11-10 – 2021-11-11 (×2): 2.5 mg via ORAL
  Filled 2021-11-10 (×2): qty 1

## 2021-11-10 MED ORDER — SODIUM CHLORIDE 0.9 % IV SOLN: INTRAVENOUS | Status: DC

## 2021-11-10 MED ORDER — HYDROMORPHONE HCL 1 MG/ML IJ SOLN
0.5000 mg | INTRAMUSCULAR | Status: DC | PRN
  Administered 2021-11-10: 1 mg via INTRAVENOUS
  Filled 2021-11-10 (×2): qty 1

## 2021-11-10 MED ORDER — OXYCODONE HCL 5 MG PO TABS
ORAL_TABLET | ORAL | Status: AC
  Filled 2021-11-10: qty 2

## 2021-11-10 MED ORDER — METOCLOPRAMIDE HCL 5 MG/ML IJ SOLN: 5.0000 mg | Freq: Three times a day (TID) | INTRAMUSCULAR | Status: DC | PRN

## 2021-11-10 MED ORDER — ONDANSETRON HCL 4 MG/2ML IJ SOLN
INTRAMUSCULAR | Status: AC
  Filled 2021-11-10: qty 2

## 2021-11-10 SURGICAL SUPPLY — 38 items
BAG COUNTER SPONGE SURGICOUNT (BAG) IMPLANT
BAG SPNG CNTER NS LX DISP (BAG)
BLADE SURG 21 STRL SS (BLADE) ×3 IMPLANT
BNDG COHESIVE 6X5 TAN STRL LF (GAUZE/BANDAGES/DRESSINGS) IMPLANT
BNDG GAUZE ELAST 4 BULKY (GAUZE/BANDAGES/DRESSINGS) ×4 IMPLANT
COVER SURGICAL LIGHT HANDLE (MISCELLANEOUS) ×6 IMPLANT
DRAPE U-SHAPE 47X51 STRL (DRAPES) ×3 IMPLANT
DRESSING VERAFLO CLEANS CC MED (GAUZE/BANDAGES/DRESSINGS) IMPLANT
DRSG ADAPTIC 3X8 NADH LF (GAUZE/BANDAGES/DRESSINGS) ×2 IMPLANT
DRSG VERAFLO CLEANSE CC MED (GAUZE/BANDAGES/DRESSINGS) ×3
DURAPREP 26ML APPLICATOR (WOUND CARE) ×3 IMPLANT
ELECT REM PT RETURN 9FT ADLT (ELECTROSURGICAL)
ELECTRODE REM PT RTRN 9FT ADLT (ELECTROSURGICAL) IMPLANT
GAUZE SPONGE 4X4 12PLY STRL (GAUZE/BANDAGES/DRESSINGS) ×2 IMPLANT
GLOVE BIOGEL PI IND STRL 9 (GLOVE) ×2 IMPLANT
GLOVE BIOGEL PI INDICATOR 9 (GLOVE) ×1
GLOVE SURG ORTHO 9.0 STRL STRW (GLOVE) ×3 IMPLANT
GOWN STRL REUS W/ TWL XL LVL3 (GOWN DISPOSABLE) ×4 IMPLANT
GOWN STRL REUS W/TWL XL LVL3 (GOWN DISPOSABLE) ×6
GRAFT SKIN WND MICRO 38 (Tissue) ×1 IMPLANT
GRAFT SKIN WND SURGIBIND 3X7 (Tissue) ×1 IMPLANT
HANDPIECE INTERPULSE COAX TIP (DISPOSABLE)
KIT BASIN OR (CUSTOM PROCEDURE TRAY) ×3 IMPLANT
KIT DRSG PREVENA PLUS 7DAY 125 (MISCELLANEOUS) ×1 IMPLANT
KIT TURNOVER KIT B (KITS) ×3 IMPLANT
MANIFOLD NEPTUNE II (INSTRUMENTS) ×3 IMPLANT
NS IRRIG 1000ML POUR BTL (IV SOLUTION) ×3 IMPLANT
PACK ORTHO EXTREMITY (CUSTOM PROCEDURE TRAY) ×3 IMPLANT
PAD ARMBOARD 7.5X6 YLW CONV (MISCELLANEOUS) ×6 IMPLANT
PAD NEG PRESSURE SENSATRAC (MISCELLANEOUS) ×1 IMPLANT
SET HNDPC FAN SPRY TIP SCT (DISPOSABLE) IMPLANT
STOCKINETTE IMPERVIOUS 9X36 MD (GAUZE/BANDAGES/DRESSINGS) IMPLANT
SUT ETHILON 2 0 PSLX (SUTURE) ×3 IMPLANT
SWAB COLLECTION DEVICE MRSA (MISCELLANEOUS) ×2 IMPLANT
SWAB CULTURE ESWAB REG 1ML (MISCELLANEOUS) IMPLANT
TOWEL GREEN STERILE (TOWEL DISPOSABLE) ×3 IMPLANT
TUBE CONNECTING 12X1/4 (SUCTIONS) ×3 IMPLANT
YANKAUER SUCT BULB TIP NO VENT (SUCTIONS) ×3 IMPLANT

## 2021-11-10 NOTE — H&P (Signed)
Alice [name] is an 51 y.o. female.   Chief Complaint: Painful venous ulceration right leg HPI: Patient is a 51 year old woman who has had chronic venous insufficiency ulceration right leg.  Patient has undergone skin grafting compression and still has a persistent ulcer.  Most recent irrigation debridement was in May 2020.  Patient complains of pain at the area of the wound she is wearing knee-high compression socks.  Past Medical History:  Diagnosis Date   Anemia    Anxiety    Arthritis    Chest pain    Chronic bronchitis (HCC)    Chronic upper back pain    Depression    Depression    DVT (deep venous thrombosis) (HCC)    BLE   GERD (gastroesophageal reflux disease)    Headache    "weekly" (04/16/2015)   Hyperlipidemia    Hypertension    Joint pain    Migraine    "monthly" (04/16/2015)   Morbid obesity with BMI of 50.0-59.9, adult ([place])    Obesity    Other fatigue    Pneumonia 2014   Pulmonary embolism (HCC)    Sinusitis nasal    Sleep apnea    "I'm suppose to wear a mask but I don't" (04/16/2015)   SOB (shortness of breath) on exertion    Varicose veins of right lower extremity    Venous stasis of lower extremity    right   Wears dentures    Wears glasses     Past Surgical History:  Procedure Laterality Date   CHOLECYSTECTOMY N/A 04/18/2015   Procedure: LAPAROSCOPIC CHOLECYSTECTOMY;  Surgeon: Ralene Ok, MD;  Location: [facility];  Service: General;  Laterality: N/A;   HYSTEROSCOPY WITH D & C  12/24/2001   Archie Endo 09/28/2010   I & D EXTREMITY Right 07/25/2016   Procedure: IRRIGATION AND DEBRIDEMENT RIGHT LEG ULCER, APPLY [place];  Surgeon: [name], MD;  Location: [facility];  Service: Orthopedics;  Laterality: Right;   I & D EXTREMITY Right 09/28/2018   Procedure: DEBRIDEMENT RIGHT LOWER LEG WITH PLACEMENT WITH PLACEMENT OF SKIN GRAFT AND VAC;  Surgeon: [name], MD;  Location: [facility];  Service: Orthopedics;  Laterality: Right;   I & D EXTREMITY Right  10/03/2018   Procedure: REPEAT IRRIGATION AND DEBRIDEMENT RIGHT LOWER LEG, VAC PLACEMENT;  Surgeon: [name], MD;  Location: [facility];  Service: Orthopedics;  Laterality: Right;   INCISE AND DRAIN ABCESS Right 07/14/2016   INCISION AND DRAINAGE Right 09/10/2008   leg:  skin and soft tissue and muscle/notes 09/15/2010   INCISION AND DRAINAGE Right 01/01/2008   Chronic venous stasis insufficiency ulcer,/notes 09/14/2010/   INCISION AND DRAINAGE Right 0/6269   calf w/application wound vac/notes 07/20/2010   INCISION AND DRAINAGE Right 07/25/2016   IRRIGATION AND DEBRIDEMENT RIGHT LEG ULCER,   LAPAROSCOPIC GASTRIC BYPASS  ~ 2007   MULTIPLE TOOTH EXTRACTIONS     SKIN GRAFT SPLIT THICKNESS LEG / FOOT Right 07/25/2016   LEG   SKIN SPLIT GRAFT Right 07/27/2016   Procedure: SKIN GRAFT RIGHT LEG WITH THERASKIN APPLICATION;  Surgeon: [name], MD;  Location: [facility];  Service: Orthopedics;  Laterality: Right;   TONSILLECTOMY      Family History  Problem Relation Age of Onset   Kidney disease Mother        kidney transplant   Diabetes Mother    Hyperlipidemia Mother    Hypertension Mother    Stroke Mother    Diabetes  Father    Hypertension Father    Hyperlipidemia Father    Stroke Father    Kidney disease Father    Schizophrenia Father    Alcoholism Father    Social History:  reports that she has never smoked. She has never used smokeless tobacco. She reports that she does not drink alcohol and does not use drugs.  Allergies:  Allergies  Allergen Reactions   Vicodin [Hydrocodone-Acetaminophen] Nausea And Vomiting    Medications Prior to Admission  Medication Sig Dispense Refill   acetaminophen (TYLENOL) 325 MG tablet Take 1-2 tablets (325-650 mg total) by mouth every 4 (four) hours as needed for mild pain.     albuterol (PROVENTIL HFA;VENTOLIN HFA) 108 (90 Base) MCG/ACT inhaler Inhale 2 puffs into the lungs every 6 (six) hours as needed for wheezing or shortness of breath.       amLODipine (NORVASC) 10 MG tablet Take 1 tablet (10 mg total) by mouth daily. 31 tablet 0   ELIQUIS 2.5 MG TABS tablet TAKE 1 TABLET(2.5 MG) BY MOUTH TWICE DAILY. FOLLOW-UP AS NEEDED FOR MORE REFILLS 180 tablet 1   gabapentin (NEURONTIN) 400 MG capsule Take 400 mg by mouth 3 (three) times daily.     losartan (COZAAR) 25 MG tablet Take 25 mg by mouth daily.     nitroGLYCERIN (NITROSTAT) 0.4 MG SL tablet Place 1 tablet (0.4 mg total) under the tongue every 5 (five) minutes x 3 doses as needed for chest pain. 30 tablet 4   OZEMPIC, 0.25 OR 0.5 MG/DOSE, 2 MG/3ML SOPN Inject 0.25 mg into the skin every Thursday.     pantoprazole (PROTONIX) 40 MG tablet Take 1 tablet (40 mg total) by mouth daily. For acid reflux 30 tablet 0   tiZANidine (ZANAFLEX) 4 MG tablet Take 4-8 mg by mouth every 8 (eight) hours as needed for muscle spasms.      Results for orders placed or performed during the hospital encounter of 11/10/21 (from the past 48 hour(s))  CBC     Status: Abnormal   Collection Time: 11/10/21  5:54 AM  Result Value Ref Range   WBC 5.2 4.0 - 10.5 K/uL   RBC 3.77 (L) 3.87 - 5.11 MIL/uL   Hemoglobin 10.6 (L) 12.0 - 15.0 g/dL   HCT 31.8 (L) 36.0 - 46.0 %   MCV 84.4 80.0 - 100.0 fL   MCH 28.1 26.0 - 34.0 pg   MCHC 33.3 30.0 - 36.0 g/dL   RDW 14.6 11.5 - 15.5 %   Platelets 195 150 - 400 K/uL   nRBC 0.0 0.0 - 0.2 %    Comment: Performed at [facility], [place] [address] [place], [place] [address]  Basic metabolic panel     Status: Abnormal   Collection Time: 11/10/21  5:54 AM  Result Value Ref Range   Sodium 140 135 - 145 mmol/L   Potassium 4.0 3.5 - 5.1 mmol/L   Chloride 109 98 - 111 mmol/L   CO2 21 (L) 22 - 32 mmol/L   Glucose, Bld 96 70 - 99 mg/dL    Comment: Glucose reference range applies only to samples taken after fasting for at least 8 hours.   BUN 41 (H) 6 - 20 mg/dL   Creatinine, Ser 3.24 (H) 0.44 - 1.00 mg/dL   Calcium 8.3 (L) 8.9 - 10.3 mg/dL   GFR, Estimated 17 (L) >60  mL/min    Comment: (NOTE) Calculated using the CKD-EPI Creatinine Equation (2021)    Anion gap  10 5 - 15    Comment: Performed at [facility], [place] [address] [place], [place] [address]  Pregnancy, urine POC     Status: None   Collection Time: 11/10/21  6:26 AM  Result Value Ref Range   Preg Test, Ur NEGATIVE NEGATIVE    Comment:        THE SENSITIVITY OF THIS METHODOLOGY IS >24 mIU/mL    No results found.  Review of Systems  All other systems reviewed and are negative.   Blood pressure 110/80, pulse 62, temperature 97.9 F (36.6 C), temperature source Oral, resp. rate 17, height 5\' 8"  (1.727 m), weight (!) 137.4 kg, SpO2 97 %. Physical Exam  Patient is alert, oriented, no adenopathy, well-dressed, normal affect, normal respiratory effort. Examination patient still has some swelling in the right leg with brawny skin color changes.  She has a persistent venous insufficiency ulcer medial right calf.  The wound measures 3 x 5 cm and is 1 mm deep.  There is healthy granulation tissue in the wound bed.  With approximately 50% fibrinous exudative tissue.  There is no dermatitis or cellulitis no drainage. Assessment/Plan 1. 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  Outpatient surgery with discharge with a Praveena plus portable wound VAC pump.  [name], MD 11/10/2021, 6:46 AM

## 2021-11-10 NOTE — Anesthesia Procedure Notes (Signed)
Procedure Name: LMA Insertion Date/Time: 11/10/2021 7:35 AM  Performed by: Minerva Ends, CRNAPre-anesthesia Checklist: Patient identified, Emergency Drugs available, Suction available and Patient being monitored Patient Re-evaluated:Patient Re-evaluated prior to induction Oxygen Delivery Method: Circle system utilized Preoxygenation: Pre-oxygenation with 100% oxygen Induction Type: IV induction Ventilation: Mask ventilation without difficulty LMA Size: 4.0 Tube type: Oral Number of attempts: 1 Placement Confirmation: positive ETCO2 and breath sounds checked- equal and bilateral Tube secured with: Tape Dental Injury: Teeth and Oropharynx as per pre-operative assessment

## 2021-11-10 NOTE — Transfer of Care (Signed)
Immediate Anesthesia Transfer of Care Note  Patient: Alice [name]  Procedure(s) Performed: RIGHT LEG DEBRIDEMENT AND APPLY SKIN GRAFT (Right) APPLICATION OF WOUND VAC  Patient Location: PACU  Anesthesia Type:General  Level of Consciousness: awake, alert  and oriented  Airway & Oxygen Therapy: Patient Spontanous Breathing and Patient connected to nasal cannula oxygen  Post-op Assessment: Report given to RN and Post -op Vital signs reviewed and stable  Post vital signs: Reviewed and stable  Last Vitals:  Vitals Value Taken Time  BP 124/78 11/10/21 0808  Temp    Pulse 71 11/10/21 0810  Resp 11 11/10/21 0810  SpO2 97 % 11/10/21 0810  Vitals shown include unvalidated device data.  Last Pain:  Vitals:   11/10/21 0620  TempSrc:   PainSc: 4       Patients Stated Pain Goal: 0 (99/14/44 5848)  Complications: No notable events documented.

## 2021-11-10 NOTE — Op Note (Signed)
11/10/2021  8:07 AM  PATIENT:  Alice [name]    PRE-OPERATIVE DIAGNOSIS:  Right Lower Extremity Chronic Ulcer  POST-OPERATIVE DIAGNOSIS:  Same  PROCEDURE:  RIGHT LEG EXCISIONAL DEBRIDEMENT DEBRIDEMENT ULCER AND APPLY SKIN GRAFT, APPLICATION OF WOUND VAC  SURGEON:  [name], MD  PHYSICIAN ASSISTANT:None ANESTHESIA:   General  PREOPERATIVE INDICATIONS:  Alice [name] is a  51 y.o. female with a diagnosis of Right Lower Extremity Chronic Ulcer who failed conservative measures and elected for surgical management.    The risks benefits and alternatives were discussed with the patient preoperatively including but not limited to the risks of infection, bleeding, nerve injury, cardiopulmonary complications, the need for revision surgery, among others, and the patient was willing to proceed.  OPERATIVE IMPLANTS: Kerecis micro 38 cm and Kerecis sheet 3 x 7 cm.  @ENCIMAGES @  OPERATIVE FINDINGS: After debridement wound bed had healthy bleeding granulation tissue tissue was sent for cultures.  Final wound measurement 10 x 10 cm.  OPERATIVE PROCEDURE: Patient brought the operating room and underwent general anesthetic.  After adequate levels anesthesia were obtained patient's right lower extremity was prepped using DuraPrep draped into a sterile field a timeout was called.  A 21 blade knife was used to surgically excise the skin and soft tissue and fascia down to bleeding viable granulation tissue.  Wound bed was 10 x 10 cm after debridement.  The wound was irrigated the Kerecis 38 cm micro powder was applied followed by a 3 x 7 cm graft of Kerecis.  This was secured with staples covered with a cleanse choice sponge derma tack [place] and a Coban wrap this had a good suction fit patient was extubated taken the PACU in stable condition.  Debridement type: Excisional Debridement  Side: right  Body Location: leg   Tools used for debridement: scalpel  Pre-debridement Wound size (cm):    Length: 3        Width: 5     Depth: 1   Post-debridement Wound size (cm):   Length: 10        Width: 10     Depth: 1   Debridement depth beyond dead/damaged tissue down to healthy viable tissue: yes  Tissue layer involved: skin, subcutaneous tissue, muscle / fascia  Nature of tissue removed: Wellsite geologist Tissue  Irrigation volume: 1 liter     Irrigation fluid type: Normal Saline     DISCHARGE PLANNING:  Antibiotic duration: 24 hours IV antibiotics  Weightbearing: Weightbearing as tolerated  Pain medication: Opioid pathway  Dressing care/ Wound VAC: Continue wound VAC  Ambulatory devices: Walker or crutches  Discharge to: Anticipate discharge to home  Follow-up: In the office 1 week post operative.

## 2021-11-10 NOTE — Anesthesia Preprocedure Evaluation (Signed)
Anesthesia Evaluation  Patient identified by MRN, date of birth, ID band Patient awake    Reviewed: Allergy & Precautions, H&P , NPO status , Patient's Chart, lab work & pertinent test results  Airway Mallampati: II   Neck ROM: full    Dental   Pulmonary sleep apnea ,    breath sounds clear to auscultation       Cardiovascular hypertension,  Rhythm:regular Rate:Normal     Neuro/Psych  Headaches, PSYCHIATRIC DISORDERS Anxiety Depression  Neuromuscular disease    GI/Hepatic GERD  ,  Endo/Other  Morbid obesity  Renal/GU Renal InsufficiencyRenal disease     Musculoskeletal  (+) Arthritis ,   Abdominal   Peds  Hematology   Anesthesia Other Findings   Reproductive/Obstetrics                             Anesthesia Physical Anesthesia Plan  ASA: 3  Anesthesia Plan: General   Post-op Pain Management:    Induction: Intravenous  PONV Risk Score and Plan: 3 and Ondansetron, Dexamethasone, Midazolam and Treatment may vary due to age or medical condition  Airway Management Planned: LMA  Additional Equipment:   Intra-op Plan:   Post-operative Plan: Extubation in OR  Informed Consent: I have reviewed the patients History and Physical, chart, labs and discussed the procedure including the risks, benefits and alternatives for the proposed anesthesia with the patient or authorized representative who has indicated his/her understanding and acceptance.     Dental advisory given  Plan Discussed with: CRNA, Anesthesiologist and Surgeon  Anesthesia Plan Comments:         Anesthesia Quick Evaluation

## 2021-11-11 ENCOUNTER — Encounter (HOSPITAL_COMMUNITY): Payer: Self-pay | Admitting: Orthopedic Surgery

## 2021-11-11 DIAGNOSIS — E119 Type 2 diabetes mellitus without complications: Secondary | ICD-10-CM | POA: Diagnosis not present

## 2021-11-11 DIAGNOSIS — I87331 Chronic venous hypertension (idiopathic) with ulcer and inflammation of right lower extremity: Secondary | ICD-10-CM | POA: Diagnosis not present

## 2021-11-11 DIAGNOSIS — I872 Venous insufficiency (chronic) (peripheral): Secondary | ICD-10-CM | POA: Diagnosis not present

## 2021-11-11 MED ORDER — HYDROCODONE-ACETAMINOPHEN 5-325 MG PO TABS: 1.0000 | ORAL_TABLET | ORAL | 0 refills | Status: DC | PRN

## 2021-11-11 NOTE — Progress Notes (Signed)
Referral made with [employer] for RW. Pt declined RW. Explained per [employer] liaison it will be an out of pocket expense. Pt with rollator @ home. Whitman Hero RN,BSN,CM

## 2021-11-11 NOTE — Anesthesia Postprocedure Evaluation (Signed)
Anesthesia Post Note  Patient: Alice [name]  Procedure(s) Performed: RIGHT LEG DEBRIDEMENT AND APPLY SKIN GRAFT (Right) APPLICATION OF WOUND VAC     Patient location during evaluation: PACU Anesthesia Type: General Level of consciousness: awake and alert Pain management: pain level controlled Vital Signs Assessment: post-procedure vital signs reviewed and stable Respiratory status: spontaneous breathing, nonlabored ventilation, respiratory function stable and patient connected to nasal cannula oxygen Cardiovascular status: blood pressure returned to baseline and stable Postop Assessment: no apparent nausea or vomiting Anesthetic complications: no   No notable events documented.  Last Vitals:  Vitals:   11/10/21 2007 11/11/21 0513  BP: (!) 142/106 (!) 153/87  Pulse: 67 79  Resp: 16 15  Temp: 36.9 C 36.8 C  SpO2: 96% 96%    Last Pain:  Vitals:   11/11/21 0513  TempSrc: Oral  PainSc:                  [place]

## 2021-11-11 NOTE — Progress Notes (Signed)
Patient ID: Alice [name], female   DOB: 03-11-1971, 51 y.o.   MRN: [id number] Patient is postoperative day 1 status post Kerecis tissue graft for chronic venous insufficiency ulcer.  Patient states the last night the wound VAC stopped working.  She states she called out to the front desk to notify nursing that the wound VAC was not working she states that no one came by to check on this.  I plugged in the wound VAC this morning.  Plan for discharge today.  Cultures pending.

## 2021-11-11 NOTE — Evaluation (Addendum)
Physical Therapy Evaluation Patient Details Name: Alice [name] MRN: [id number] DOB: 10/02/1970 Today's Date: 11/11/2021  History of Present Illness  Pt is a 51 y/o female who presents to acute care physical therapy s/p R leg excisional debridement of ulcer and application of skin graft as well as wound vac. Pt's PMH includes DVT, anemia, GERD, obesity, HTN, pneumonia, and depression.  Clinical Impression  Pt agreeable to physical therapy evaluation/treatment session. Pt performing bed mobility independently, transfers at modified independent level, and CGA level for ambulation. Pt also performing stairs at CGA level. Pt discharging home today with help from sister. No further acute care needs.     Recommendations for follow up therapy are one component of a multi-disciplinary discharge planning process, led by the attending physician.  Recommendations may be updated based on patient status, additional functional criteria and insurance authorization.  Follow Up Recommendations Follow physician's recommendations for discharge plan and follow up therapies      Assistance Recommended at Discharge PRN  Patient can return home with the following  A little help with walking and/or transfers;A little help with bathing/dressing/bathroom;Assist for transportation;Help with stairs or ramp for entrance    Equipment Recommendations Rolling walker (2 wheels)  Recommendations for Other Services       Functional Status Assessment Patient has had a recent decline in their functional status and demonstrates the ability to make significant improvements in function in a reasonable and predictable amount of time.     Precautions / Restrictions Precautions Precautions: Fall (wound vac) Restrictions Weight Bearing Restrictions: Yes RLE Weight Bearing: Weight bearing as tolerated      Mobility  Bed Mobility Overal bed mobility: Independent             General bed mobility comments: Pt  sitting on BSC upon arrival.    Transfers Overall transfer level: Modified independent Equipment used: Rolling walker (2 wheels)                    Ambulation/Gait Ambulation/Gait assistance: Min guard Gait Distance (Feet): 100 Feet (50 feet x 2 (stair training between)) Assistive device: Rolling walker (2 wheels) Gait Pattern/deviations: Step-to pattern, Decreased step length - right, Decreased step length - left, Decreased stance time - right, Antalgic   Gait velocity interpretation: 1.31 - 2.62 ft/sec, indicative of limited community ambulator   General Gait Details: Pt instructed in and performed modified three point gait pattern. Pt with decreased weight acceptance on R LE.  Stairs Stairs: Yes Stairs assistance: Min guard Stair Management: One rail Left, Forwards, Step to pattern Number of Stairs: 3 General stair comments: Pt ascended and descended 3 steps respectively. Pt utilized L handrail with B UE's (ascending view) when performing to be specific to her home environment.  Wheelchair Mobility    Modified Rankin (Stroke Patients Only)       Balance Overall balance assessment: Mild deficits observed, not formally tested                                           Pertinent Vitals/Pain Pain Assessment Pain Assessment: 0-10 Pain Score: 8  Pain Location: right medial lower leg Pain Descriptors / Indicators: Operative site guarding, Throbbing Pain Intervention(s): Limited activity within patient's tolerance, Monitored during session    Home Living Family/patient expects to be discharged to:: Private residence Living Arrangements: Alone Available Help at Discharge: Available 24 hours/day (  sister available to help) Type of Home: Apartment Home Access: Stairs to enter Entrance Stairs-Rails: Left Entrance Stairs-Number of Steps: 4   Home Layout: One level Home Equipment: None      Prior Function Prior Level of Function :  Working/employed;Driving;Independent/Modified Independent             Mobility Comments: Not use AD; required rail use with stairs. ADLs Comments: Independent with all ADL's     Hand Dominance   Dominant Hand: Right    Extremity/Trunk Assessment   Upper Extremity Assessment Upper Extremity Assessment: Overall WFL for tasks assessed    Lower Extremity Assessment Lower Extremity Assessment:  (painful weakness of R LE; otherwise WFL's)       Communication   Communication: No difficulties  Cognition Arousal/Alertness: Awake/alert Behavior During Therapy: WFL for tasks assessed/performed Overall Cognitive Status: Within Functional Limits for tasks assessed                                          General Comments General comments (skin integrity, edema, etc.): 97% SpO2 and 94 HR    Exercises     Assessment/Plan    PT Assessment Patient does not need any further PT services  PT Problem List         PT Treatment Interventions      PT Goals (Current goals can be found in the Care Plan section)       Frequency       Co-evaluation               AM-PAC PT "6 Clicks" Mobility  Outcome Measure Help needed turning from your back to your side while in a flat bed without using bedrails?: None Help needed moving from lying on your back to sitting on the side of a flat bed without using bedrails?: None Help needed moving to and from a bed to a chair (including a wheelchair)?: None Help needed standing up from a chair using your arms (e.g., wheelchair or bedside chair)?: None Help needed to walk in hospital room?: A Little Help needed climbing 3-5 steps with a railing? : A Little 6 Click Score: 22    End of Session Equipment Utilized During Treatment: Gait belt Activity Tolerance: Patient tolerated treatment well;Patient limited by pain Patient left: in chair;with call bell/phone within reach Nurse Communication: Patient requests pain  meds;Mobility status;Weight bearing status PT Visit Diagnosis: Pain;Difficulty in walking, not elsewhere classified (R26.2)    Time: 1610-9604 PT Time Calculation (min) (ACUTE ONLY): 29 min   Charges:   PT Evaluation $PT Eval Low Complexity: 1 Low PT Treatments $Gait Training: 8-22 mins        Donna Bernard, PT   [facility] 11/11/2021, 12:05 PM

## 2021-11-11 NOTE — Discharge Summary (Signed)
Discharge Diagnoses:  Principal Problem:   H/O skin graft Active Problems:   Abscess of right lower leg   Surgeries: Procedure(s): RIGHT LEG DEBRIDEMENT AND APPLY SKIN GRAFT APPLICATION OF WOUND VAC on 11/10/2021    Consultants:   Discharged Condition: Improved  Hospital Course: Alice [name] is an 51 y.o. female who was admitted 11/10/2021 with a chief complaint of right lower extremity chronic ulcer, with a final diagnosis of Right Lower Extremity Chronic Ulcer.  Patient was brought to the operating room on 11/10/2021 and underwent Procedure(s): RIGHT LEG DEBRIDEMENT AND APPLY SKIN GRAFT APPLICATION OF WOUND VAC.    Patient was given perioperative antibiotics:  Anti-infectives (From admission, onward)    Start     Dose/Rate Route Frequency Ordered Stop   11/10/21 1115  ceFAZolin (ANCEF) IVPB 2g/100 mL premix        2 g 200 mL/hr over 30 Minutes Intravenous Every 6 hours 11/10/21 1100 11/11/21 0514   11/10/21 0600  ceFAZolin (ANCEF) IVPB 3g/100 mL premix        3 g 200 mL/hr over 30 Minutes Intravenous On call to O.R. 11/10/21 [id number] 11/10/21 0743     .  Patient was given sequential compression devices, early ambulation, and aspirin for DVT prophylaxis.  Recent vital signs: Patient Vitals for the past 24 hrs:  BP Temp Temp src Pulse Resp SpO2  11/11/21 0513 (!) 153/87 98.3 F (36.8 C) Oral 79 15 96 %  11/10/21 2007 (!) 142/106 98.5 F (36.9 C) -- 67 16 96 %  11/10/21 1528 (!) 155/121 97.7 F (36.5 C) Oral 99 -- 93 %  11/10/21 1430 (!) 144/96 -- -- 78 15 95 %  11/10/21 1400 (!) 130/100 -- -- 80 12 95 %  11/10/21 1300 137/84 -- -- 93 (!) 21 95 %  11/10/21 1130 130/82 -- -- 70 17 96 %  11/10/21 1030 123/77 -- -- 71 (!) 9 96 %  11/10/21 1000 125/79 -- -- 70 (!) 9 95 %  11/10/21 0945 130/75 -- -- 75 10 93 %  11/10/21 0930 137/88 (!) 97 F (36.1 C) -- 79 12 96 %  11/10/21 0845 122/75 -- -- 81 12 96 %  11/10/21 0830 (!) 139/99 -- -- 82 (!) 21 97 %  11/10/21 0815 136/88  -- -- 72 10 97 %  11/10/21 0810 124/78 97.6 F (36.4 C) -- 71 11 97 %  .  Recent laboratory studies: No results found.  Discharge Medications:   Allergies as of 11/11/2021       Reactions   Vicodin [hydrocodone-acetaminophen] Nausea And Vomiting        Medication List     TAKE these medications    acetaminophen 325 MG tablet Commonly known as: TYLENOL Take 1-2 tablets (325-650 mg total) by mouth every 4 (four) hours as needed for mild pain.   albuterol 108 (90 Base) MCG/ACT inhaler Commonly known as: VENTOLIN HFA Inhale 2 puffs into the lungs every 6 (six) hours as needed for wheezing or shortness of breath.   amLODipine 10 MG tablet Commonly known as: NORVASC Take 1 tablet (10 mg total) by mouth daily.   Eliquis 2.5 MG Tabs tablet Generic drug: apixaban TAKE 1 TABLET(2.5 MG) BY MOUTH TWICE DAILY. FOLLOW-UP AS NEEDED FOR MORE REFILLS   gabapentin 400 MG capsule Commonly known as: NEURONTIN Take 400 mg by mouth 3 (three) times daily.   HYDROcodone-acetaminophen 5-325 MG tablet Commonly known as: NORCO/VICODIN Take 1 tablet by mouth every 4 (four) hours  as needed.   losartan 25 MG tablet Commonly known as: COZAAR Take 25 mg by mouth daily.   nitroGLYCERIN 0.4 MG SL tablet Commonly known as: NITROSTAT Place 1 tablet (0.4 mg total) under the tongue every 5 (five) minutes x 3 doses as needed for chest pain.   Ozempic (0.25 or 0.5 MG/DOSE) 2 MG/3ML Sopn Generic drug: Semaglutide(0.25 or 0.5MG /DOS) Inject 0.25 mg into the skin every Thursday.   pantoprazole 40 MG tablet Commonly known as: PROTONIX Take 1 tablet (40 mg total) by mouth daily. For acid reflux   tiZANidine 4 MG tablet Commonly known as: ZANAFLEX Take 4-8 mg by mouth every 8 (eight) hours as needed for muscle spasms.               Discharge Care Instructions  (From admission, onward)           Start     Ordered   11/11/21 0000  Weight bearing as tolerated       Question Answer  Comment  Laterality bilateral   Extremity Lower      11/11/21 0741            Diagnostic Studies: No results found.  Patient benefited maximally from their hospital stay and there were no complications.     Disposition: Discharge disposition: 01-Home or Self Care      Discharge Instructions     Call MD / Call 911   Complete by: As directed    If you experience chest pain or shortness of breath, CALL 911 and be transported to the hospital emergency room.  If you develope a fever above 101 F, pus (white drainage) or increased drainage or redness at the wound, or calf pain, call your surgeon's office.   Constipation Prevention   Complete by: As directed    Drink plenty of fluids.  Prune juice may be helpful.  You may use a stool softener, such as Colace (over the counter) 100 mg twice a day.  Use MiraLax (over the counter) for constipation as needed.   Diet - low sodium heart healthy   Complete by: As directed    Elevate operative extremity   Complete by: As directed    Increase activity slowly as tolerated   Complete by: As directed    Negative Pressure Wound Therapy - Incisional   Complete by: As directed    Post-operative opioid taper instructions:   Complete by: As directed    POST-OPERATIVE OPIOID TAPER INSTRUCTIONS: It is important to wean off of your opioid medication as soon as possible. If you do not need pain medication after your surgery it is ok to stop day one. Opioids include: Codeine, Hydrocodone(Norco, Vicodin), Oxycodone(Percocet, oxycontin) and hydromorphone amongst others.  Long term and even short term use of opiods can cause: Increased pain response Dependence Constipation Depression Respiratory depression And more.  Withdrawal symptoms can include Flu like symptoms Nausea, vomiting And more Techniques to manage these symptoms Hydrate well Eat regular healthy meals Stay active Use relaxation techniques(deep breathing, meditating, yoga) Do  Not substitute Alcohol to help with tapering If you have been on opioids for less than two weeks and do not have pain than it is ok to stop all together.  Plan to wean off of opioids This plan should start within one week post op of your joint replacement. Maintain the same interval or time between taking each dose and first decrease the dose.  Cut the total daily intake of opioids by one tablet  each day Next start to increase the time between doses. The last dose that should be eliminated is the evening dose.      Weight bearing as tolerated   Complete by: As directed    Laterality: bilateral   Extremity: Lower       Follow-up Information     [name], MD Follow up in 1 week(s).   Specialty: Orthopedic Surgery Contact information: [address] [place] [address] [phone]                  Signed: [name] 11/11/2021, 7:41 AM

## 2021-11-11 NOTE — Plan of Care (Signed)
  Problem: Health Behavior/Discharge Planning: Goal: Ability to manage health-related needs will improve Outcome: Progressing   Problem: Clinical Measurements: Goal: Ability to maintain clinical measurements within normal limits will improve Outcome: Progressing   Problem: Pain Managment: Goal: General experience of comfort will improve Outcome: Progressing   Problem: Safety: Goal: Ability to remain free from injury will improve Outcome: Progressing   Problem: Skin Integrity: Goal: Risk for impaired skin integrity will decrease Outcome: Progressing   

## 2021-11-12 ENCOUNTER — Other Ambulatory Visit: Payer: Self-pay | Admitting: Orthopedic Surgery

## 2021-11-12 MED ORDER — DOXYCYCLINE HYCLATE 100 MG PO TABS: 100.0000 mg | ORAL_TABLET | Freq: Two times a day (BID) | ORAL | 0 refills | Status: DC

## 2021-11-15 LAB — AEROBIC/ANAEROBIC CULTURE W GRAM STAIN (SURGICAL/DEEP WOUND): Gram Stain: NONE SEEN

## 2021-11-17 ENCOUNTER — Encounter: Payer: Self-pay | Admitting: Family

## 2021-11-17 ENCOUNTER — Ambulatory Visit (INDEPENDENT_AMBULATORY_CARE_PROVIDER_SITE_OTHER): Payer: Medicare Other | Admitting: Family

## 2021-11-17 DIAGNOSIS — Z9889 Other specified postprocedural states: Secondary | ICD-10-CM

## 2021-11-17 NOTE — Progress Notes (Signed)
Post-Op Visit Note   Patient: Alice [name]           Date of Birth: 1970/10/13           MRN: [id number] Visit Date: 11/17/2021 PCP: Benito Mccreedy, MD  Chief Complaint: No chief complaint on file.   HPI:  HPI The patient is a 51 year old woman who presents today 1 week status post irrigation debridement with Kerecis skin grafting of the right lower extremity. wound VAC removed today Ortho Exam On examination of the right lower extremity the graft is in place with staples there is control of her lower extremity edema.  There is bleeding granulation through the graft material no surrounding erythema no active drainage  Visit Diagnoses:  1. History of artificial skin graft     Plan: Dynaflex compression garment to the right lower extremity we will reevaluate in 1 week.  [place] to transition to compression stockings.  Follow-Up Instructions: No follow-ups on file.   Imaging: No results found.  Orders:  No orders of the defined types were placed in this encounter.  No orders of the defined types were placed in this encounter.    PMFS History: Patient Active Problem List   Diagnosis Date Noted   H/O skin graft 11/10/2021   Abscess of right lower leg    SOB (shortness of breath) on exertion 09/15/2020   Vitamin D deficiency 09/15/2020   Hyperglycemia 09/15/2020   Subtherapeutic international normalized ratio (INR)    Postoperative pain    Hypoalbuminemia    Acute on chronic kidney failure ([place])    Acute blood loss anemia    Benign essential HTN    Other fatigue 10/05/2018   Chronic ulcer of right leg, with fat layer exposed ([place]) 09/28/2018   Chronic ulcer of leg, right, with necrosis of muscle ([place])    Iron deficiency anemia 01/29/2018   Pulmonary embolus ([place]) 01/09/2018   Encounter for therapeutic drug monitoring 01/09/2018   Severe malnutrition ([place]) 08/24/2017   Acute kidney injury superimposed on CKD ([place]) 12/28/2016   Hx of skin graft 08/01/2016    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 ([place]) 07/25/2016   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 ([place]) 07/05/2016   Trichomonal infection 11/24/2015   Abdominal pain 05/02/2015   Symptomatic cholelithiasis 04/16/2015   Acute bilateral upper abdominal pain 04/16/2015   Microcytic anemia 05/18/2013   Hypotension, unspecified 05/17/2013   Anemia due to stage 3b chronic kidney disease ([place]) 05/17/2013   Hypokalemia 05/17/2013   Anal fissure 02/06/2013   Anal skin tag 02/06/2013   ALLERGIC RHINITIS 03/05/2010   LOW BACK PAIN SYNDROME 12/13/2007   OBESITY, MORBID 12/02/2006   HTN (hypertension) 12/02/2006   History of pulmonary embolism 12/02/2006   History of DVT (deep vein thrombosis) 12/02/2006   SYNDROME, POSTPHLEBITIC W/ULCER & INFLM 12/02/2006   GASTROESOPHAGEAL REFLUX DISEASE 12/02/2006   OSA (obstructive sleep apnea) 12/02/2006   Past Medical History:  Diagnosis Date   Anemia    Anxiety    Arthritis    Chest pain    Chronic bronchitis (HCC)    Chronic upper back pain    Depression    Depression    DVT (deep venous thrombosis) (HCC)    BLE   GERD (gastroesophageal reflux disease)    Headache    "weekly" (04/16/2015)   Hyperlipidemia    Hypertension    Joint pain    Migraine    "monthly" (04/16/2015)  Morbid obesity with BMI of 50.0-59.9, adult (HCC)    Obesity    Other fatigue    Pneumonia 2014   Pulmonary embolism (HCC)    Sinusitis nasal    Sleep apnea    "I'm suppose to wear a mask but I don't" (04/16/2015)   SOB (shortness of breath) on exertion    Varicose veins of right lower extremity    Venous stasis of lower extremity    right   Wears dentures    Wears glasses     Family History  Problem Relation Age of Onset   Kidney disease Mother        kidney transplant   Diabetes Mother    Hyperlipidemia Mother    Hypertension Mother    Stroke Mother    Diabetes Father    Hypertension  Father    Hyperlipidemia Father    Stroke Father    Kidney disease Father    Schizophrenia Father    Alcoholism Father     Past Surgical History:  Procedure Laterality Date   APPLICATION OF WOUND VAC  11/10/2021   Procedure: APPLICATION OF WOUND VAC;  Surgeon: [name], MD;  Location: [facility];  Service: Orthopedics;;   CHOLECYSTECTOMY N/A 04/18/2015   Procedure: LAPAROSCOPIC CHOLECYSTECTOMY;  Surgeon: Ralene Ok, MD;  Location: [facility];  Service: General;  Laterality: N/A;   HYSTEROSCOPY WITH D & C  12/24/2001   Archie Endo 09/28/2010   I & D EXTREMITY Right 07/25/2016   Procedure: IRRIGATION AND DEBRIDEMENT RIGHT LEG ULCER, APPLY VERAFLO VAC;  Surgeon: [name], MD;  Location: [facility];  Service: Orthopedics;  Laterality: Right;   I & D EXTREMITY Right 09/28/2018   Procedure: DEBRIDEMENT RIGHT LOWER LEG WITH PLACEMENT WITH PLACEMENT OF SKIN GRAFT AND VAC;  Surgeon: [name], MD;  Location: [facility];  Service: Orthopedics;  Laterality: Right;   I & D EXTREMITY Right 10/03/2018   Procedure: REPEAT IRRIGATION AND DEBRIDEMENT RIGHT LOWER LEG, VAC PLACEMENT;  Surgeon: [name], MD;  Location: [facility];  Service: Orthopedics;  Laterality: Right;   I & D EXTREMITY Right 11/10/2021   Procedure: RIGHT LEG DEBRIDEMENT AND APPLY SKIN GRAFT;  Surgeon: [name], MD;  Location: [facility];  Service: Orthopedics;  Laterality: Right;   INCISE AND DRAIN ABCESS Right 07/14/2016   INCISION AND DRAINAGE Right 09/10/2008   leg:  skin and soft tissue and muscle/notes 09/15/2010   INCISION AND DRAINAGE Right 01/01/2008   Chronic venous stasis insufficiency ulcer,/notes 09/14/2010/   INCISION AND DRAINAGE Right 01/5092   calf w/application wound vac/notes 07/20/2010   INCISION AND DRAINAGE Right 07/25/2016   IRRIGATION AND DEBRIDEMENT RIGHT LEG ULCER,   LAPAROSCOPIC GASTRIC BYPASS  ~ 2007   MULTIPLE TOOTH EXTRACTIONS     SKIN GRAFT SPLIT THICKNESS LEG / FOOT Right 07/25/2016   LEG   SKIN SPLIT GRAFT Right  07/27/2016   Procedure: SKIN GRAFT RIGHT LEG WITH THERASKIN APPLICATION;  Surgeon: [name], MD;  Location: [facility];  Service: Orthopedics;  Laterality: Right;   TONSILLECTOMY     Social History   Occupational History   Occupation: unemployed  Tobacco Use   Smoking status: Never   Smokeless tobacco: Never  Vaping Use   Vaping Use: Never used  Substance and Sexual Activity   Alcohol use: No   Drug use: No   Sexual activity: Yes    Partners: Male    Birth control/protection: I.U.D.

## 2021-11-24 ENCOUNTER — Encounter: Payer: Self-pay | Admitting: Family

## 2021-11-24 ENCOUNTER — Ambulatory Visit (INDEPENDENT_AMBULATORY_CARE_PROVIDER_SITE_OTHER): Payer: Medicare Other | Admitting: Family

## 2021-11-24 DIAGNOSIS — Z9889 Other specified postprocedural states: Secondary | ICD-10-CM

## 2021-11-24 DIAGNOSIS — I87331 Chronic venous hypertension (idiopathic) with ulcer and inflammation of right lower extremity: Secondary | ICD-10-CM

## 2021-11-24 NOTE — Progress Notes (Signed)
Post-Op Visit Note   Patient: Alice [name]           Date of Birth: 1970/08/29           MRN: [id number] Visit Date: 11/24/2021 PCP: Benito Mccreedy, MD  Chief Complaint:  Chief Complaint  Patient presents with   Right Leg - Routine Post Op    11/10/21 RLE I&D w/ kerecis    HPI:  HPI The patient is a 51 year old woman who presents today in postoperative follow-up status post right lower extremity irrigation with debridement and Kerecis graft placement she has been in a Dynaflex compression wrap for the last 1 week she has changed this once in the interim Ortho Exam On examination of the right lower extremity there is controlled edema wrinkling from compression.  In the wound bed there is good uptake of the graft material there are islands of granulation staples are in place there is no surrounding erythema no active drainage no odor  Visit Diagnoses: No diagnosis found.  Plan: We will continue with current care Dynaflex for compression with nonadherent dressing ABD over this the patient prefers to apply her own dressings  Has supplies to do to dressing changes at home she will follow-up with Dr. Sharol Given on July 24.  Follow-Up Instructions: No follow-ups on file.   Imaging: No results found.  Orders:  No orders of the defined types were placed in this encounter.  No orders of the defined types were placed in this encounter.    PMFS History: Patient Active Problem List   Diagnosis Date Noted   H/O skin graft 11/10/2021   Abscess of right lower leg    SOB (shortness of breath) on exertion 09/15/2020   Vitamin D deficiency 09/15/2020   Hyperglycemia 09/15/2020   Subtherapeutic international normalized ratio (INR)    Postoperative pain    Hypoalbuminemia    Acute on chronic kidney failure ([place])    Acute blood loss anemia    Benign essential HTN    Other fatigue 10/05/2018   Chronic ulcer of right leg, with fat layer exposed ([place]) 09/28/2018   Chronic ulcer of  leg, right, with necrosis of muscle ([place])    Iron deficiency anemia 01/29/2018   Pulmonary embolus ([place]) 01/09/2018   Encounter for therapeutic drug monitoring 01/09/2018   Severe malnutrition ([place]) 08/24/2017   Acute kidney injury superimposed on CKD ([place]) 12/28/2016   Hx of skin graft 08/01/2016   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 ([place]) 07/25/2016   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 ([place]) 07/05/2016   Trichomonal infection 11/24/2015   Abdominal pain 05/02/2015   Symptomatic cholelithiasis 04/16/2015   Acute bilateral upper abdominal pain 04/16/2015   Microcytic anemia 05/18/2013   Hypotension, unspecified 05/17/2013   Anemia due to stage 3b chronic kidney disease ([place]) 05/17/2013   Hypokalemia 05/17/2013   Anal fissure 02/06/2013   Anal skin tag 02/06/2013   ALLERGIC RHINITIS 03/05/2010   LOW BACK PAIN SYNDROME 12/13/2007   OBESITY, MORBID 12/02/2006   HTN (hypertension) 12/02/2006   History of pulmonary embolism 12/02/2006   History of DVT (deep vein thrombosis) 12/02/2006   SYNDROME, POSTPHLEBITIC W/ULCER & INFLM 12/02/2006   GASTROESOPHAGEAL REFLUX DISEASE 12/02/2006   OSA (obstructive sleep apnea) 12/02/2006   Past Medical History:  Diagnosis Date   Anemia    Anxiety    Arthritis    Chest pain    Chronic bronchitis (HCC)    Chronic  upper back pain    Depression    Depression    DVT (deep venous thrombosis) (HCC)    BLE   GERD (gastroesophageal reflux disease)    Headache    "weekly" (04/16/2015)   Hyperlipidemia    Hypertension    Joint pain    Migraine    "monthly" (04/16/2015)   Morbid obesity with BMI of 50.0-59.9, adult ([place])    Obesity    Other fatigue    Pneumonia 2014   Pulmonary embolism (HCC)    Sinusitis nasal    Sleep apnea    "I'm suppose to wear a mask but I don't" (04/16/2015)   SOB (shortness of breath) on exertion    Varicose veins of right lower extremity     Venous stasis of lower extremity    right   Wears dentures    Wears glasses     Family History  Problem Relation Age of Onset   Kidney disease Mother        kidney transplant   Diabetes Mother    Hyperlipidemia Mother    Hypertension Mother    Stroke Mother    Diabetes Father    Hypertension Father    Hyperlipidemia Father    Stroke Father    Kidney disease Father    Schizophrenia Father    Alcoholism Father     Past Surgical History:  Procedure Laterality Date   APPLICATION OF WOUND VAC  11/10/2021   Procedure: APPLICATION OF WOUND VAC;  Surgeon: [name], MD;  Location: [facility];  Service: Orthopedics;;   CHOLECYSTECTOMY N/A 04/18/2015   Procedure: LAPAROSCOPIC CHOLECYSTECTOMY;  Surgeon: Ralene Ok, MD;  Location: [facility];  Service: General;  Laterality: N/A;   HYSTEROSCOPY WITH D & C  12/24/2001   Archie Endo 09/28/2010   I & D EXTREMITY Right 07/25/2016   Procedure: IRRIGATION AND DEBRIDEMENT RIGHT LEG ULCER, APPLY VERAFLO [place];  Surgeon: [name], MD;  Location: [place] [facility];  Service: Orthopedics;  Laterality: Right;   I & D EXTREMITY Right 09/28/2018   Procedure: DEBRIDEMENT RIGHT LOWER LEG WITH PLACEMENT WITH PLACEMENT OF SKIN GRAFT AND VAC;  Surgeon: [name], MD;  Location: [facility];  Service: Orthopedics;  Laterality: Right;   I & D EXTREMITY Right 10/03/2018   Procedure: REPEAT IRRIGATION AND DEBRIDEMENT RIGHT LOWER LEG, VAC PLACEMENT;  Surgeon: [name], MD;  Location: [facility];  Service: Orthopedics;  Laterality: Right;   I & D EXTREMITY Right 11/10/2021   Procedure: RIGHT LEG DEBRIDEMENT AND APPLY SKIN GRAFT;  Surgeon: [name], MD;  Location: [facility] [facility];  Service: Orthopedics;  Laterality: Right;   INCISE AND DRAIN ABCESS Right 07/14/2016   INCISION AND DRAINAGE Right 09/10/2008   leg:  skin and soft tissue and muscle/notes 09/15/2010   INCISION AND DRAINAGE Right 01/01/2008   Chronic venous stasis insufficiency ulcer,/notes 09/14/2010/   INCISION AND DRAINAGE  Right 07/2200   calf w/application wound vac/notes 07/20/2010   INCISION AND DRAINAGE Right 07/25/2016   IRRIGATION AND DEBRIDEMENT RIGHT LEG ULCER,   LAPAROSCOPIC GASTRIC BYPASS  ~ 2007   MULTIPLE TOOTH EXTRACTIONS     SKIN GRAFT SPLIT THICKNESS LEG / FOOT Right 07/25/2016   LEG   SKIN SPLIT GRAFT Right 07/27/2016   Procedure: SKIN GRAFT RIGHT LEG WITH THERASKIN APPLICATION;  Surgeon: [name], MD;  Location: [facility];  Service: Orthopedics;  Laterality: Right;   TONSILLECTOMY     Social History   Occupational History  Occupation: unemployed  Tobacco Use   Smoking status: Never   Smokeless tobacco: Never  Vaping Use   Vaping Use: Never used  Substance and Sexual Activity   Alcohol use: No   Drug use: No   Sexual activity: Yes    Partners: Male    Birth control/protection: I.U.D.

## 2021-12-03 DIAGNOSIS — D508 Other iron deficiency anemias: Secondary | ICD-10-CM | POA: Diagnosis not present

## 2021-12-03 DIAGNOSIS — N1831 Chronic kidney disease, stage 3a: Secondary | ICD-10-CM | POA: Diagnosis not present

## 2021-12-03 DIAGNOSIS — Z7901 Long term (current) use of anticoagulants: Secondary | ICD-10-CM | POA: Diagnosis not present

## 2021-12-03 DIAGNOSIS — M5442 Lumbago with sciatica, left side: Secondary | ICD-10-CM | POA: Diagnosis not present

## 2021-12-03 DIAGNOSIS — G4733 Obstructive sleep apnea (adult) (pediatric): Secondary | ICD-10-CM | POA: Diagnosis not present

## 2021-12-03 DIAGNOSIS — G629 Polyneuropathy, unspecified: Secondary | ICD-10-CM | POA: Diagnosis not present

## 2021-12-03 DIAGNOSIS — I10 Essential (primary) hypertension: Secondary | ICD-10-CM | POA: Diagnosis not present

## 2021-12-03 DIAGNOSIS — G8929 Other chronic pain: Secondary | ICD-10-CM | POA: Diagnosis not present

## 2021-12-03 DIAGNOSIS — R1013 Epigastric pain: Secondary | ICD-10-CM | POA: Diagnosis not present

## 2021-12-03 DIAGNOSIS — R7303 Prediabetes: Secondary | ICD-10-CM | POA: Diagnosis not present

## 2021-12-06 ENCOUNTER — Encounter: Payer: Self-pay | Admitting: Orthopedic Surgery

## 2021-12-06 ENCOUNTER — Ambulatory Visit (INDEPENDENT_AMBULATORY_CARE_PROVIDER_SITE_OTHER): Payer: Medicare Other | Admitting: Orthopedic Surgery

## 2021-12-06 DIAGNOSIS — I87331 Chronic venous hypertension (idiopathic) with ulcer and inflammation of right lower extremity: Secondary | ICD-10-CM | POA: Diagnosis not present

## 2021-12-06 NOTE — Progress Notes (Signed)
Office Visit Note   Patient: Alice [name]           Date of Birth: [birth year]           MRN: [id number] Visit Date: 12/06/2021              Requested by: Benito Mccreedy, MD [place] [address],  [place] [address] PCP: Benito Mccreedy, MD  Chief Complaint  Patient presents with   Right Leg - Routine Post Op    11/10/2021 RLE I&D with kerecis graft       HPI: Patient is a 51 year old woman who presents status post right lower extremity Kerecis tissue graft.  She has been in a compression wrap.  Assessment & Plan: Visit Diagnoses:  1. 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  Will remove some of the remaining staples.  Follow-Up Instructions: Return in about 4 weeks (around 01/03/2022).   Ortho Exam  Patient is alert, oriented, no adenopathy, well-dressed, normal affect, normal respiratory effort. Examination the wound has excellent granulation tissue it is 6 x 5 cm with approximately 75% healthy granulation tissue there is no redness cellulitis or drainage.  Imaging: No results found.    Labs: Lab Results  Component Value Date   HGBA1C 5.2 09/15/2020   REPTSTATUS 11/15/2021 FINAL 11/10/2021   GRAMSTAIN  11/10/2021    NO WBC SEEN RARE GRAM POSITIVE COCCI RARE GRAM POSITIVE RODS    CULT  11/10/2021    FEW METHICILLIN RESISTANT STAPHYLOCOCCUS AUREUS MODERATE CORYNEBACTERIUM STRIATUM Standardized susceptibility testing for this organism is not available. NO ANAEROBES ISOLATED Performed at [facility], [place] [address], [place], [place] [address]    LABORGA METHICILLIN RESISTANT STAPHYLOCOCCUS AUREUS 11/10/2021     Lab Results  Component Value Date   ALBUMIN 4.2 09/15/2020   ALBUMIN 3.4 (L) 05/12/2019   ALBUMIN 2.5 (L) 10/08/2018    Lab Results  Component Value Date   MG 1.9 08/19/2019   MG  1.8 12/29/2016   MG 1.9 12/28/2016   Lab Results  Component Value Date   VD25OH 12.0 (L) 09/15/2020    No results found for: "PREALBUMIN"    Latest Ref Rng & Units 11/10/2021    5:54 AM 09/15/2020   11:03 AM 08/19/2019    7:24 AM  CBC EXTENDED  WBC 4.0 - 10.5 K/uL 5.2  4.5  7.5   RBC 3.87 - 5.11 MIL/uL 3.77  4.83  4.37   Hemoglobin 12.0 - 15.0 g/dL 10.6  13.2  12.3   HCT 36.0 - 46.0 % 31.8  40.0  36.7   Platelets 150 - 400 K/uL 195   236   NEUT# 1.4 - 7.0 x10E3/uL  2.5  5.2   Lymph# 0.7 - 3.1 x10E3/uL  1.3  1.5      There is no height or weight on file to calculate BMI.  Orders:  No orders of the defined types were placed in this encounter.  No orders of the defined types were placed in this encounter.    Procedures: No procedures performed  Clinical Data: No additional findings.  ROS:  All other systems negative, except as noted in the HPI. Review of Systems  Objective: Vital Signs: There were no vitals taken for this visit.  Specialty Comments:  No specialty comments available.  PMFS History: Patient Active Problem List  Diagnosis Date Noted   H/O skin graft 11/10/2021   Abscess of right lower leg    SOB (shortness of breath) on exertion 09/15/2020   Vitamin D deficiency 09/15/2020   Hyperglycemia 09/15/2020   Subtherapeutic international normalized ratio (INR)    Postoperative pain    Hypoalbuminemia    Acute on chronic kidney failure ([place])    Acute blood loss anemia    Benign essential HTN    Other fatigue 10/05/2018   Chronic ulcer of right leg, with fat layer exposed ([place]) 09/28/2018   Chronic ulcer of leg, right, with necrosis of muscle ([place])    Iron deficiency anemia 01/29/2018   Pulmonary embolus ([place]) 01/09/2018   Encounter for therapeutic drug monitoring 01/09/2018   Severe malnutrition ([place]) 08/24/2017   Acute kidney injury superimposed on CKD ([place]) 12/28/2016   Hx of skin graft 08/01/2016   Venous ulcer of right lower extremity with  varicose veins ([place]) 07/25/2016   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 ([place]) 07/05/2016   Trichomonal infection 11/24/2015   Abdominal pain 05/02/2015   Symptomatic cholelithiasis 04/16/2015   Acute bilateral upper abdominal pain 04/16/2015   Microcytic anemia 05/18/2013   Hypotension, unspecified 05/17/2013   Anemia due to stage 3b chronic kidney disease ([place]) 05/17/2013   Hypokalemia 05/17/2013   Anal fissure 02/06/2013   Anal skin tag 02/06/2013   ALLERGIC RHINITIS 03/05/2010   LOW BACK PAIN SYNDROME 12/13/2007   OBESITY, MORBID 12/02/2006   HTN (hypertension) 12/02/2006   History of pulmonary embolism 12/02/2006   History of DVT (deep vein thrombosis) 12/02/2006   SYNDROME, POSTPHLEBITIC W/ULCER & INFLM 12/02/2006   GASTROESOPHAGEAL REFLUX DISEASE 12/02/2006   OSA (obstructive sleep apnea) 12/02/2006   Past Medical History:  Diagnosis Date   Anemia    Anxiety    Arthritis    Chest pain    Chronic bronchitis (HCC)    Chronic upper back pain    Depression    Depression    DVT (deep venous thrombosis) (HCC)    BLE   GERD (gastroesophageal reflux disease)    Headache    "weekly" (04/16/2015)   Hyperlipidemia    Hypertension    Joint pain    Migraine    "monthly" (04/16/2015)   Morbid obesity with BMI of 50.0-59.9, adult ([place])    Obesity    Other fatigue    Pneumonia 2014   Pulmonary embolism (HCC)    Sinusitis nasal    Sleep apnea    "I'm suppose to wear a mask but I don't" (04/16/2015)   SOB (shortness of breath) on exertion    Varicose veins of right lower extremity    Venous stasis of lower extremity    right   Wears dentures    Wears glasses     Family History  Problem Relation Age of Onset   Kidney disease Mother        kidney transplant   Diabetes Mother    Hyperlipidemia Mother    Hypertension Mother    Stroke Mother    Diabetes Father    Hypertension Father    Hyperlipidemia Father     Stroke Father    Kidney disease Father    Schizophrenia Father    Alcoholism Father     Past Surgical History:  Procedure Laterality Date   APPLICATION OF WOUND VAC  11/10/2021   Procedure: APPLICATION OF WOUND VAC;  Surgeon: [name], MD;  Location: [facility];  Service: Orthopedics;;   CHOLECYSTECTOMY N/A 04/18/2015   Procedure: LAPAROSCOPIC CHOLECYSTECTOMY;  Surgeon: Ralene Ok, MD;  Location: [facility];  Service: General;  Laterality: N/A;   HYSTEROSCOPY WITH D & C  12/24/2001   Archie Endo 09/28/2010   I & D EXTREMITY Right 07/25/2016   Procedure: IRRIGATION AND DEBRIDEMENT RIGHT LEG ULCER, APPLY VERAFLO [place];  Surgeon: [name], MD;  Location: [facility];  Service: Orthopedics;  Laterality: Right;   I & D EXTREMITY Right 09/28/2018   Procedure: DEBRIDEMENT RIGHT LOWER LEG WITH PLACEMENT WITH PLACEMENT OF SKIN GRAFT AND VAC;  Surgeon: [name], MD;  Location: [facility];  Service: Orthopedics;  Laterality: Right;   I & D EXTREMITY Right 10/03/2018   Procedure: REPEAT IRRIGATION AND DEBRIDEMENT RIGHT LOWER LEG, VAC PLACEMENT;  Surgeon: [name], MD;  Location: [facility];  Service: Orthopedics;  Laterality: Right;   I & D EXTREMITY Right 11/10/2021   Procedure: RIGHT LEG DEBRIDEMENT AND APPLY SKIN GRAFT;  Surgeon: [name], MD;  Location: [facility];  Service: Orthopedics;  Laterality: Right;   INCISE AND DRAIN ABCESS Right 07/14/2016   INCISION AND DRAINAGE Right 09/10/2008   leg:  skin and soft tissue and muscle/notes 09/15/2010   INCISION AND DRAINAGE Right 01/01/2008   Chronic venous stasis insufficiency ulcer,/notes 09/14/2010/   INCISION AND DRAINAGE Right 11/2618   calf w/application wound vac/notes 07/20/2010   INCISION AND DRAINAGE Right 07/25/2016   IRRIGATION AND DEBRIDEMENT RIGHT LEG ULCER,   LAPAROSCOPIC GASTRIC BYPASS  ~ 2007   MULTIPLE TOOTH EXTRACTIONS     SKIN GRAFT SPLIT THICKNESS LEG / FOOT Right 07/25/2016   LEG   SKIN SPLIT GRAFT Right 07/27/2016   Procedure: SKIN GRAFT RIGHT  LEG WITH THERASKIN APPLICATION;  Surgeon: [name], MD;  Location: [facility];  Service: Orthopedics;  Laterality: Right;   TONSILLECTOMY     Social History   Occupational History   Occupation: unemployed  Tobacco Use   Smoking status: Never   Smokeless tobacco: Never  Vaping Use   Vaping Use: Never used  Substance and Sexual Activity   Alcohol use: No   Drug use: No   Sexual activity: Yes    Partners: Male    Birth control/protection: I.U.D.

## 2021-12-08 DIAGNOSIS — G629 Polyneuropathy, unspecified: Secondary | ICD-10-CM | POA: Diagnosis not present

## 2021-12-08 DIAGNOSIS — G8929 Other chronic pain: Secondary | ICD-10-CM | POA: Diagnosis not present

## 2021-12-08 DIAGNOSIS — Z7901 Long term (current) use of anticoagulants: Secondary | ICD-10-CM | POA: Diagnosis not present

## 2021-12-08 DIAGNOSIS — G4733 Obstructive sleep apnea (adult) (pediatric): Secondary | ICD-10-CM | POA: Diagnosis not present

## 2021-12-08 DIAGNOSIS — R1013 Epigastric pain: Secondary | ICD-10-CM | POA: Diagnosis not present

## 2021-12-08 DIAGNOSIS — I10 Essential (primary) hypertension: Secondary | ICD-10-CM | POA: Diagnosis not present

## 2021-12-08 DIAGNOSIS — R7303 Prediabetes: Secondary | ICD-10-CM | POA: Diagnosis not present

## 2021-12-08 DIAGNOSIS — N1831 Chronic kidney disease, stage 3a: Secondary | ICD-10-CM | POA: Diagnosis not present

## 2021-12-08 DIAGNOSIS — M5442 Lumbago with sciatica, left side: Secondary | ICD-10-CM | POA: Diagnosis not present

## 2021-12-08 DIAGNOSIS — D508 Other iron deficiency anemias: Secondary | ICD-10-CM | POA: Diagnosis not present

## 2021-12-22 ENCOUNTER — Encounter (INDEPENDENT_AMBULATORY_CARE_PROVIDER_SITE_OTHER): Payer: Self-pay

## 2021-12-31 ENCOUNTER — Encounter: Payer: Self-pay | Admitting: Family

## 2021-12-31 ENCOUNTER — Ambulatory Visit (INDEPENDENT_AMBULATORY_CARE_PROVIDER_SITE_OTHER): Payer: Medicare Other | Admitting: Family

## 2021-12-31 DIAGNOSIS — Z9889 Other specified postprocedural states: Secondary | ICD-10-CM

## 2021-12-31 DIAGNOSIS — I87331 Chronic venous hypertension (idiopathic) with ulcer and inflammation of right lower extremity: Secondary | ICD-10-CM

## 2021-12-31 NOTE — Progress Notes (Signed)
Post-Op Visit Note   Patient: Alice [name]           Date of Birth: 1970-12-14           MRN: [id number] Visit Date: 12/31/2021 PCP: Benito Mccreedy, MD  Chief Complaint: No chief complaint on file.   HPI:  HPI The patient is a 51 year old woman who presents in follow-up after irrigation debridement of an abscess of her right lower leg on June 28.  She has been doing home dressings with compression wrapping and gauze she had trialed course of using medical compression stocking and did not feel this was working well she is pleased with her progress  Ortho Exam On examination of the right lower extremity wound she has excellent granulation tissue this is 5 cm in diameter with 80% granulation there is no active drainage no surrounding erythema no redness no cellulitis No visible staples today Visit Diagnoses: No diagnosis found.  Plan: Profore wrap applied today.  She will follow-up in the office in 1 week for reevaluation  Follow-Up Instructions: No follow-ups on file.   Imaging: No results found.  Orders:  No orders of the defined types were placed in this encounter.  No orders of the defined types were placed in this encounter.    PMFS History: Patient Active Problem List   Diagnosis Date Noted   H/O skin graft 11/10/2021   Abscess of right lower leg    SOB (shortness of breath) on exertion 09/15/2020   Vitamin D deficiency 09/15/2020   Hyperglycemia 09/15/2020   Subtherapeutic international normalized ratio (INR)    Postoperative pain    Hypoalbuminemia    Acute on chronic kidney failure ([place])    Acute blood loss anemia    Benign essential HTN    Other fatigue 10/05/2018   Chronic ulcer of right leg, with fat layer exposed ([place]) 09/28/2018   Chronic ulcer of leg, right, with necrosis of muscle ([place])    Iron deficiency anemia 01/29/2018   Pulmonary embolus ([place]) 01/09/2018   Encounter for therapeutic drug monitoring 01/09/2018   Severe malnutrition  ([place]) 08/24/2017   Acute kidney injury superimposed on CKD ([place]) 12/28/2016   Hx of skin graft 08/01/2016   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 ([place]) 07/25/2016   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 ([place]) 07/05/2016   Trichomonal infection 11/24/2015   Abdominal pain 05/02/2015   Symptomatic cholelithiasis 04/16/2015   Acute bilateral upper abdominal pain 04/16/2015   Microcytic anemia 05/18/2013   Hypotension, unspecified 05/17/2013   Anemia due to stage 3b chronic kidney disease ([place]) 05/17/2013   Hypokalemia 05/17/2013   Anal fissure 02/06/2013   Anal skin tag 02/06/2013   ALLERGIC RHINITIS 03/05/2010   LOW BACK PAIN SYNDROME 12/13/2007   OBESITY, MORBID 12/02/2006   HTN (hypertension) 12/02/2006   History of pulmonary embolism 12/02/2006   History of DVT (deep vein thrombosis) 12/02/2006   SYNDROME, POSTPHLEBITIC W/ULCER & INFLM 12/02/2006   GASTROESOPHAGEAL REFLUX DISEASE 12/02/2006   OSA (obstructive sleep apnea) 12/02/2006   Past Medical History:  Diagnosis Date   Anemia    Anxiety    Arthritis    Chest pain    Chronic bronchitis (HCC)    Chronic upper back pain    Depression    Depression    DVT (deep venous thrombosis) (HCC)    BLE   GERD (gastroesophageal reflux disease)    Headache    "weekly" (04/16/2015)  Hyperlipidemia    Hypertension    Joint pain    Migraine    "monthly" (04/16/2015)   Morbid obesity with BMI of 50.0-59.9, adult (HCC)    Obesity    Other fatigue    Pneumonia 2014   Pulmonary embolism (HCC)    Sinusitis nasal    Sleep apnea    "I'm suppose to wear a mask but I don't" (04/16/2015)   SOB (shortness of breath) on exertion    Varicose veins of right lower extremity    Venous stasis of lower extremity    right   Wears dentures    Wears glasses     Family History  Problem Relation Age of Onset   Kidney disease Mother        kidney transplant   Diabetes  Mother    Hyperlipidemia Mother    Hypertension Mother    Stroke Mother    Diabetes Father    Hypertension Father    Hyperlipidemia Father    Stroke Father    Kidney disease Father    Schizophrenia Father    Alcoholism Father     Past Surgical History:  Procedure Laterality Date   APPLICATION OF WOUND VAC  11/10/2021   Procedure: APPLICATION OF WOUND VAC;  Surgeon: [name], MD;  Location: [facility];  Service: Orthopedics;;   CHOLECYSTECTOMY N/A 04/18/2015   Procedure: LAPAROSCOPIC CHOLECYSTECTOMY;  Surgeon: Ralene Ok, MD;  Location: [facility];  Service: General;  Laterality: N/A;   HYSTEROSCOPY WITH D & C  12/24/2001   Archie Endo 09/28/2010   I & D EXTREMITY Right 07/25/2016   Procedure: IRRIGATION AND DEBRIDEMENT RIGHT LEG ULCER, APPLY VERAFLO [place];  Surgeon: [name], MD;  Location: [facility];  Service: Orthopedics;  Laterality: Right;   I & D EXTREMITY Right 09/28/2018   Procedure: DEBRIDEMENT RIGHT LOWER LEG WITH PLACEMENT WITH PLACEMENT OF SKIN GRAFT AND VAC;  Surgeon: [name], MD;  Location: [place];  Service: Orthopedics;  Laterality: Right;   I & D EXTREMITY Right 10/03/2018   Procedure: REPEAT IRRIGATION AND DEBRIDEMENT RIGHT LOWER LEG, VAC PLACEMENT;  Surgeon: [name], MD;  Location: [place];  Service: Orthopedics;  Laterality: Right;   I & D EXTREMITY Right 11/10/2021   Procedure: RIGHT LEG DEBRIDEMENT AND APPLY SKIN GRAFT;  Surgeon: [name], MD;  Location: [place];  Service: Orthopedics;  Laterality: Right;   INCISE AND DRAIN ABCESS Right 07/14/2016   INCISION AND DRAINAGE Right 09/10/2008   leg:  skin and soft tissue and muscle/notes 09/15/2010   INCISION AND DRAINAGE Right 01/01/2008   Chronic venous stasis insufficiency ulcer,/notes 09/14/2010/   INCISION AND DRAINAGE Right 0/6237   calf w/application wound vac/notes 07/20/2010   INCISION AND DRAINAGE Right 07/25/2016   IRRIGATION AND DEBRIDEMENT RIGHT LEG ULCER,   LAPAROSCOPIC GASTRIC BYPASS  ~ 2007   MULTIPLE  TOOTH EXTRACTIONS     SKIN GRAFT SPLIT THICKNESS LEG / FOOT Right 07/25/2016   LEG   SKIN SPLIT GRAFT Right 07/27/2016   Procedure: SKIN GRAFT RIGHT LEG WITH THERASKIN APPLICATION;  Surgeon: [name], MD;  Location: [facility];  Service: Orthopedics;  Laterality: Right;   TONSILLECTOMY     Social History   Occupational History   Occupation: unemployed  Tobacco Use   Smoking status: Never   Smokeless tobacco: Never  Vaping Use   Vaping Use: Never used  Substance and Sexual Activity   Alcohol use: No   Drug use:  No   Sexual activity: Yes    Partners: Male    Birth control/protection: I.U.D.

## 2022-01-06 DIAGNOSIS — N184 Chronic kidney disease, stage 4 (severe): Secondary | ICD-10-CM | POA: Diagnosis not present

## 2022-01-10 DIAGNOSIS — D631 Anemia in chronic kidney disease: Secondary | ICD-10-CM | POA: Diagnosis not present

## 2022-01-10 DIAGNOSIS — N2581 Secondary hyperparathyroidism of renal origin: Secondary | ICD-10-CM | POA: Diagnosis not present

## 2022-01-10 DIAGNOSIS — N184 Chronic kidney disease, stage 4 (severe): Secondary | ICD-10-CM | POA: Diagnosis not present

## 2022-01-10 DIAGNOSIS — R809 Proteinuria, unspecified: Secondary | ICD-10-CM | POA: Diagnosis not present

## 2022-01-10 DIAGNOSIS — I129 Hypertensive chronic kidney disease with stage 1 through stage 4 chronic kidney disease, or unspecified chronic kidney disease: Secondary | ICD-10-CM | POA: Diagnosis not present

## 2022-01-11 ENCOUNTER — Encounter: Payer: Medicare Other | Admitting: Family

## 2022-01-13 ENCOUNTER — Other Ambulatory Visit: Payer: Self-pay | Admitting: Nephrology

## 2022-01-13 ENCOUNTER — Other Ambulatory Visit (HOSPITAL_COMMUNITY): Payer: Self-pay | Admitting: Nephrology

## 2022-01-13 DIAGNOSIS — N184 Chronic kidney disease, stage 4 (severe): Secondary | ICD-10-CM

## 2022-01-13 DIAGNOSIS — D631 Anemia in chronic kidney disease: Secondary | ICD-10-CM

## 2022-01-13 DIAGNOSIS — R809 Proteinuria, unspecified: Secondary | ICD-10-CM

## 2022-01-14 ENCOUNTER — Ambulatory Visit (INDEPENDENT_AMBULATORY_CARE_PROVIDER_SITE_OTHER): Payer: Medicare Other | Admitting: Family

## 2022-01-14 DIAGNOSIS — I87331 Chronic venous hypertension (idiopathic) with ulcer and inflammation of right lower extremity: Secondary | ICD-10-CM

## 2022-01-14 DIAGNOSIS — Z9889 Other specified postprocedural states: Secondary | ICD-10-CM

## 2022-01-14 NOTE — Progress Notes (Signed)
Office Visit Note   Patient: Alice [name]           Date of Birth: 1971/05/16           MRN: [id number] Visit Date: 01/14/2022              Requested by: Benito Mccreedy, MD [address],  [place] [address] PCP: Benito Mccreedy, MD  Chief Complaint  Patient presents with   Right Leg - Routine Post Op    11/10/21 RIGHT LEG EXCISIONAL DEBRIDEMENT DEBRIDEMENT ULCER AND APPLY SKIN GRAFT,      HPI: The patient is a 51 year old woman who is seen status post right lower extremity irrigation debridement with Kerecis graft placement operatively this was done on June 28.  She presents for weekly follow-up has been doing Dynaflex dressing changes twice weekly  Assessment & Plan: Visit Diagnoses: No diagnosis found.  Plan: Donated Kerecis powder applied after debridement back to granulating bleeding tissue discussed scaffolding dressing she will leave this in place until next visit but change the outer gauze and compression daily  Follow-Up Instructions: No follow-ups on file.   Ortho Exam  Patient is alert, oriented, no adenopathy, well-dressed, normal affect, normal respiratory effort. On examination of the right lower extremity she has a medial calf ulcer this is currently measuring 6 cm in diameter is filled in with 50% fibrinous exudate tissue there has been good incorporation of her prior Kerecis graft.  This was debrided with gauze back to granulating bleeding tissue there is no surrounding erythema no warmth no drainage  Imaging: No results found. No images are attached to the encounter.  Labs: Lab Results  Component Value Date   HGBA1C 5.2 09/15/2020   REPTSTATUS 11/15/2021 FINAL 11/10/2021   GRAMSTAIN  11/10/2021    NO WBC SEEN RARE GRAM POSITIVE COCCI RARE GRAM POSITIVE RODS    CULT  11/10/2021    FEW METHICILLIN RESISTANT STAPHYLOCOCCUS AUREUS MODERATE CORYNEBACTERIUM STRIATUM Standardized susceptibility testing for this organism is  not available. NO ANAEROBES ISOLATED Performed at [facility], [place] [address] , [place] [address]    LABORGA METHICILLIN RESISTANT STAPHYLOCOCCUS AUREUS 11/10/2021     Lab Results  Component Value Date   ALBUMIN 4.2 09/15/2020   ALBUMIN 3.4 (L) 05/12/2019   ALBUMIN 2.5 (L) 10/08/2018    Lab Results  Component Value Date   MG 1.9 08/19/2019   MG 1.8 12/29/2016   MG 1.9 12/28/2016   Lab Results  Component Value Date   VD25OH 12.0 (L) 09/15/2020    No results found for: "PREALBUMIN"    Latest Ref Rng & Units 11/10/2021    5:54 AM 09/15/2020   11:03 AM 08/19/2019    7:24 AM  CBC EXTENDED  WBC 4.0 - 10.5 K/uL 5.2  4.5  7.5   RBC 3.87 - 5.11 MIL/uL 3.77  4.83  4.37   Hemoglobin 12.0 - 15.0 g/dL 10.6  13.2  12.3   HCT 36.0 - 46.0 % 31.8  40.0  36.7   Platelets 150 - 400 K/uL 195   236   NEUT# 1.4 - 7.0 x10E3/uL  2.5  5.2   Lymph# 0.7 - 3.1 x10E3/uL  1.3  1.5      There is no height or weight on file to calculate BMI.  Orders:  No orders of the defined types were placed in this encounter.  No orders of the defined types were placed in this encounter.  Procedures: No procedures performed  Clinical Data: No additional findings.  ROS:  All other systems negative, except as noted in the HPI. Review of Systems  Constitutional: Negative.   Skin:  Positive for wound. Negative for color change.    Objective: Vital Signs: There were no vitals taken for this visit.  Specialty Comments:  No specialty comments available.  PMFS History: Patient Active Problem List   Diagnosis Date Noted   H/O skin graft 11/10/2021   Abscess of right lower leg    SOB (shortness of breath) on exertion 09/15/2020   Vitamin D deficiency 09/15/2020   Hyperglycemia 09/15/2020   Subtherapeutic international normalized ratio (INR)    Postoperative pain    Hypoalbuminemia    Acute on chronic kidney failure ([place])    Acute blood loss anemia    Benign essential HTN     Other fatigue 10/05/2018   Chronic ulcer of right leg, with fat layer exposed ([place]) 09/28/2018   Chronic ulcer of leg, right, with necrosis of muscle ([place])    Iron deficiency anemia 01/29/2018   Pulmonary embolus ([place]) 01/09/2018   Encounter for therapeutic drug monitoring 01/09/2018   Severe malnutrition ([place]) 08/24/2017   Acute kidney injury superimposed on CKD ([place]) 12/28/2016   Hx of skin graft 08/01/2016   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 ([place]) 07/25/2016   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 ([place]) 07/05/2016   Trichomonal infection 11/24/2015   Abdominal pain 05/02/2015   Symptomatic cholelithiasis 04/16/2015   Acute bilateral upper abdominal pain 04/16/2015   Microcytic anemia 05/18/2013   Hypotension, unspecified 05/17/2013   Anemia due to stage 3b chronic kidney disease ([place]) 05/17/2013   Hypokalemia 05/17/2013   Anal fissure 02/06/2013   Anal skin tag 02/06/2013   ALLERGIC RHINITIS 03/05/2010   LOW BACK PAIN SYNDROME 12/13/2007   OBESITY, MORBID 12/02/2006   HTN (hypertension) 12/02/2006   History of pulmonary embolism 12/02/2006   History of DVT (deep vein thrombosis) 12/02/2006   SYNDROME, POSTPHLEBITIC W/ULCER & INFLM 12/02/2006   GASTROESOPHAGEAL REFLUX DISEASE 12/02/2006   OSA (obstructive sleep apnea) 12/02/2006   Past Medical History:  Diagnosis Date   Anemia    Anxiety    Arthritis    Chest pain    Chronic bronchitis (HCC)    Chronic upper back pain    Depression    Depression    DVT (deep venous thrombosis) (HCC)    BLE   GERD (gastroesophageal reflux disease)    Headache    "weekly" (04/16/2015)   Hyperlipidemia    Hypertension    Joint pain    Migraine    "monthly" (04/16/2015)   Morbid obesity with BMI of 50.0-59.9, adult ([place])    Obesity    Other fatigue    Pneumonia 2014   Pulmonary embolism (HCC)    Sinusitis nasal    Sleep apnea    "I'm suppose to wear a  mask but I don't" (04/16/2015)   SOB (shortness of breath) on exertion    Varicose veins of right lower extremity    Venous stasis of lower extremity    right   Wears dentures    Wears glasses     Family History  Problem Relation Age of Onset   Kidney disease Mother        kidney transplant   Diabetes Mother    Hyperlipidemia Mother    Hypertension Mother    Stroke Mother  Diabetes Father    Hypertension Father    Hyperlipidemia Father    Stroke Father    Kidney disease Father    Schizophrenia Father    Alcoholism Father     Past Surgical History:  Procedure Laterality Date   APPLICATION OF WOUND VAC  11/10/2021   Procedure: APPLICATION OF WOUND VAC;  Surgeon: [name], MD;  Location: [facility];  Service: Orthopedics;;   CHOLECYSTECTOMY N/A 04/18/2015   Procedure: LAPAROSCOPIC CHOLECYSTECTOMY;  Surgeon: Ralene Ok, MD;  Location: [facility];  Service: General;  Laterality: N/A;   HYSTEROSCOPY WITH D & C  12/24/2001   Archie Endo 09/28/2010   I & D EXTREMITY Right 07/25/2016   Procedure: IRRIGATION AND DEBRIDEMENT RIGHT LEG ULCER, APPLY VERAFLO [place];  Surgeon: [name], MD;  Location: [facility];  Service: Orthopedics;  Laterality: Right;   I & D EXTREMITY Right 09/28/2018   Procedure: DEBRIDEMENT RIGHT LOWER LEG WITH PLACEMENT WITH PLACEMENT OF SKIN GRAFT AND VAC;  Surgeon: [name], MD;  Location: [facility];  Service: Orthopedics;  Laterality: Right;   I & D EXTREMITY Right 10/03/2018   Procedure: REPEAT IRRIGATION AND DEBRIDEMENT RIGHT LOWER LEG, VAC PLACEMENT;  Surgeon: [name], MD;  Location: [facility];  Service: Orthopedics;  Laterality: Right;   I & D EXTREMITY Right 11/10/2021   Procedure: RIGHT LEG DEBRIDEMENT AND APPLY SKIN GRAFT;  Surgeon: [name], MD;  Location: [facility];  Service: Orthopedics;  Laterality: Right;   INCISE AND DRAIN ABCESS Right 07/14/2016   INCISION AND DRAINAGE Right 09/10/2008   leg:  skin and soft tissue and muscle/notes 09/15/2010   INCISION AND  DRAINAGE Right 01/01/2008   Chronic venous stasis insufficiency ulcer,/notes 09/14/2010/   INCISION AND DRAINAGE Right 05/9756   calf w/application wound vac/notes 07/20/2010   INCISION AND DRAINAGE Right 07/25/2016   IRRIGATION AND DEBRIDEMENT RIGHT LEG ULCER,   LAPAROSCOPIC GASTRIC BYPASS  ~ 2007   MULTIPLE TOOTH EXTRACTIONS     SKIN GRAFT SPLIT THICKNESS LEG / FOOT Right 07/25/2016   LEG   SKIN SPLIT GRAFT Right 07/27/2016   Procedure: SKIN GRAFT RIGHT LEG WITH THERASKIN APPLICATION;  Surgeon: [name], MD;  Location: [facility];  Service: Orthopedics;  Laterality: Right;   TONSILLECTOMY     Social History   Occupational History   Occupation: unemployed  Tobacco Use   Smoking status: Never   Smokeless tobacco: Never  Vaping Use   Vaping Use: Never used  Substance and Sexual Activity   Alcohol use: No   Drug use: No   Sexual activity: Yes    Partners: Male    Birth control/protection: I.U.D.

## 2022-01-21 ENCOUNTER — Ambulatory Visit (INDEPENDENT_AMBULATORY_CARE_PROVIDER_SITE_OTHER): Payer: Medicare Other | Admitting: Family

## 2022-01-21 DIAGNOSIS — Z9889 Other specified postprocedural states: Secondary | ICD-10-CM | POA: Diagnosis not present

## 2022-01-21 DIAGNOSIS — I87331 Chronic venous hypertension (idiopathic) with ulcer and inflammation of right lower extremity: Secondary | ICD-10-CM

## 2022-01-26 ENCOUNTER — Ambulatory Visit (INDEPENDENT_AMBULATORY_CARE_PROVIDER_SITE_OTHER): Payer: Medicare Other | Admitting: Family

## 2022-01-26 ENCOUNTER — Encounter: Payer: Self-pay | Admitting: Family

## 2022-01-26 DIAGNOSIS — Z945 Skin transplant status: Secondary | ICD-10-CM

## 2022-01-26 NOTE — Progress Notes (Signed)
Office Visit Note   Patient: Alice [name]           Date of Birth: 1970/11/05           MRN: [id number] Visit Date: 01/26/2022              Requested by: Benito Mccreedy, MD [address] [place],  [place] [address] PCP: Benito Mccreedy, MD  Chief Complaint  Patient presents with   Right Leg - Routine Post Op    11/10/2021 RLE debridement and Kerecis graft       HPI: The patient is a 51 year old woman who is seen status post right lower extremity irrigation debridement with Kerecis graft placement operatively this was done on June 28.  In office Kerecis placement 10 days ago.  She presents for weekly follow-up has been doing Dynaflex dressing changes twice weekly, once at home and once in office.   Assessment & Plan: Visit Diagnoses: No diagnosis found.  Plan: We will continue ompression wrapping as previously.  Dynaflex applied today.   Follow-Up Instructions: No follow-ups on file.   Ortho Exam  Patient is alert, oriented, no adenopathy, well-dressed, normal affect, normal respiratory effort. On examination of the right lower extremity she has a medial calf ulcer is filled in with granulation mounds increasing fibrinous tissue. there is no surrounding erythema no warmth no drainage  Imaging: No results found. No images are attached to the encounter.  Labs: Lab Results  Component Value Date   HGBA1C 5.2 09/15/2020   REPTSTATUS 11/15/2021 FINAL 11/10/2021   GRAMSTAIN  11/10/2021    NO WBC SEEN RARE GRAM POSITIVE COCCI RARE GRAM POSITIVE RODS    CULT  11/10/2021    FEW METHICILLIN RESISTANT STAPHYLOCOCCUS AUREUS MODERATE CORYNEBACTERIUM STRIATUM Standardized susceptibility testing for this organism is not available. NO ANAEROBES ISOLATED Performed at [facility], [place] [address] [place], [place] [address]    LABORGA METHICILLIN RESISTANT STAPHYLOCOCCUS AUREUS 11/10/2021     Lab Results  Component Value Date   ALBUMIN 4.2  09/15/2020   ALBUMIN 3.4 (L) 05/12/2019   ALBUMIN 2.5 (L) 10/08/2018    Lab Results  Component Value Date   MG 1.9 08/19/2019   MG 1.8 12/29/2016   MG 1.9 12/28/2016   Lab Results  Component Value Date   VD25OH 12.0 (L) 09/15/2020    No results found for: "PREALBUMIN"    Latest Ref Rng & Units 11/10/2021    5:54 AM 09/15/2020   11:03 AM 08/19/2019    7:24 AM  CBC EXTENDED  WBC 4.0 - 10.5 K/uL 5.2  4.5  7.5   RBC 3.87 - 5.11 MIL/uL 3.77  4.83  4.37   Hemoglobin 12.0 - 15.0 g/dL 10.6  13.2  12.3   HCT 36.0 - 46.0 % 31.8  40.0  36.7   Platelets 150 - 400 K/uL 195   236   NEUT# 1.4 - 7.0 x10E3/uL  2.5  5.2   Lymph# 0.7 - 3.1 x10E3/uL  1.3  1.5      There is no height or weight on file to calculate BMI.  Orders:  No orders of the defined types were placed in this encounter.  No orders of the defined types were placed in this encounter.    Procedures: No procedures performed  Clinical Data: No additional findings.  ROS:  All other systems negative, except as noted in the HPI. Review of Systems  Constitutional: Negative.   Skin:  Positive for  wound. Negative for color change.    Objective: Vital Signs: There were no vitals taken for this visit.  Specialty Comments:  No specialty comments available.  PMFS History: Patient Active Problem List   Diagnosis Date Noted   H/O skin graft 11/10/2021   Abscess of right lower leg    SOB (shortness of breath) on exertion 09/15/2020   Vitamin D deficiency 09/15/2020   Hyperglycemia 09/15/2020   Subtherapeutic international normalized ratio (INR)    Postoperative pain    Hypoalbuminemia    Acute on chronic kidney failure ([place])    Acute blood loss anemia    Benign essential HTN    Other fatigue 10/05/2018   Chronic ulcer of right leg, with fat layer exposed ([place]) 09/28/2018   Chronic ulcer of leg, right, with necrosis of muscle ([place])    Iron deficiency anemia 01/29/2018   Pulmonary embolus ([place]) 01/09/2018    Encounter for therapeutic drug monitoring 01/09/2018   Severe malnutrition ([place]) 08/24/2017   Acute kidney injury superimposed on CKD ([place]) 12/28/2016   Hx of skin graft 08/01/2016   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 ([place]) 07/25/2016   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 ([place]) 07/05/2016   Trichomonal infection 11/24/2015   Abdominal pain 05/02/2015   Symptomatic cholelithiasis 04/16/2015   Acute bilateral upper abdominal pain 04/16/2015   Microcytic anemia 05/18/2013   Hypotension, unspecified 05/17/2013   Anemia due to stage 3b chronic kidney disease ([place]) 05/17/2013   Hypokalemia 05/17/2013   Anal fissure 02/06/2013   Anal skin tag 02/06/2013   ALLERGIC RHINITIS 03/05/2010   LOW BACK PAIN SYNDROME 12/13/2007   OBESITY, MORBID 12/02/2006   HTN (hypertension) 12/02/2006   History of pulmonary embolism 12/02/2006   History of DVT (deep vein thrombosis) 12/02/2006   SYNDROME, POSTPHLEBITIC W/ULCER & INFLM 12/02/2006   GASTROESOPHAGEAL REFLUX DISEASE 12/02/2006   OSA (obstructive sleep apnea) 12/02/2006   Past Medical History:  Diagnosis Date   Anemia    Anxiety    Arthritis    Chest pain    Chronic bronchitis (HCC)    Chronic upper back pain    Depression    Depression    DVT (deep venous thrombosis) (HCC)    BLE   GERD (gastroesophageal reflux disease)    Headache    "weekly" (04/16/2015)   Hyperlipidemia    Hypertension    Joint pain    Migraine    "monthly" (04/16/2015)   Morbid obesity with BMI of 50.0-59.9, adult ([place])    Obesity    Other fatigue    Pneumonia 2014   Pulmonary embolism (HCC)    Sinusitis nasal    Sleep apnea    "I'm suppose to wear a mask but I don't" (04/16/2015)   SOB (shortness of breath) on exertion    Varicose veins of right lower extremity    Venous stasis of lower extremity    right   Wears dentures    Wears glasses     Family History  Problem Relation  Age of Onset   Kidney disease Mother        kidney transplant   Diabetes Mother    Hyperlipidemia Mother    Hypertension Mother    Stroke Mother    Diabetes Father    Hypertension Father    Hyperlipidemia Father    Stroke Father    Kidney disease Father    Schizophrenia Father    Alcoholism Father  Past Surgical History:  Procedure Laterality Date   APPLICATION OF WOUND VAC  11/10/2021   Procedure: APPLICATION OF WOUND VAC;  Surgeon: [name], MD;  Location: [facility];  Service: Orthopedics;;   CHOLECYSTECTOMY N/A 04/18/2015   Procedure: LAPAROSCOPIC CHOLECYSTECTOMY;  Surgeon: Ralene Ok, MD;  Location: [facility];  Service: General;  Laterality: N/A;   HYSTEROSCOPY WITH D & C  12/24/2001   Archie Endo 09/28/2010   I & D EXTREMITY Right 07/25/2016   Procedure: IRRIGATION AND DEBRIDEMENT RIGHT LEG ULCER, APPLY VERAFLO [place];  Surgeon: [name], MD;  Location: [facility];  Service: Orthopedics;  Laterality: Right;   I & D EXTREMITY Right 09/28/2018   Procedure: DEBRIDEMENT RIGHT LOWER LEG WITH PLACEMENT WITH PLACEMENT OF SKIN GRAFT AND VAC;  Surgeon: [name], MD;  Location: [facility];  Service: Orthopedics;  Laterality: Right;   I & D EXTREMITY Right 10/03/2018   Procedure: REPEAT IRRIGATION AND DEBRIDEMENT RIGHT LOWER LEG, VAC PLACEMENT;  Surgeon: [name], MD;  Location: [facility];  Service: Orthopedics;  Laterality: Right;   I & D EXTREMITY Right 11/10/2021   Procedure: RIGHT LEG DEBRIDEMENT AND APPLY SKIN GRAFT;  Surgeon: [name], MD;  Location: [facility];  Service: Orthopedics;  Laterality: Right;   INCISE AND DRAIN ABCESS Right 07/14/2016   INCISION AND DRAINAGE Right 09/10/2008   leg:  skin and soft tissue and muscle/notes 09/15/2010   INCISION AND DRAINAGE Right 01/01/2008   Chronic venous stasis insufficiency ulcer,/notes 09/14/2010/   INCISION AND DRAINAGE Right 10/5679   calf w/application wound vac/notes 07/20/2010   INCISION AND DRAINAGE Right 07/25/2016   IRRIGATION AND  DEBRIDEMENT RIGHT LEG ULCER,   LAPAROSCOPIC GASTRIC BYPASS  ~ 2007   MULTIPLE TOOTH EXTRACTIONS     SKIN GRAFT SPLIT THICKNESS LEG / FOOT Right 07/25/2016   LEG   SKIN SPLIT GRAFT Right 07/27/2016   Procedure: SKIN GRAFT RIGHT LEG WITH THERASKIN APPLICATION;  Surgeon: [name], MD;  Location: [facility];  Service: Orthopedics;  Laterality: Right;   TONSILLECTOMY     Social History   Occupational History   Occupation: unemployed  Tobacco Use   Smoking status: Never   Smokeless tobacco: Never  Vaping Use   Vaping Use: Never used  Substance and Sexual Activity   Alcohol use: No   Drug use: No   Sexual activity: Yes    Partners: Male    Birth control/protection: I.U.D.

## 2022-01-26 NOTE — Progress Notes (Signed)
Office Visit Note   Patient: Alice [name]           Date of Birth: 1970/10/22           MRN: [id number] Visit Date: 01/21/2022              Requested by: Benito Mccreedy, MD [address] [place],  [place] [address] PCP: Benito Mccreedy, MD  Chief Complaint  Patient presents with   Right Leg - Routine Post Op    11/10/2021 RLE debridement and Kerecis graft       HPI: The patient is a 51 year old woman who is seen status post right lower extremity irrigation debridement with Kerecis graft placement operatively this was done on June 28.  She presents for weekly follow-up has been doing Dynaflex dressing changes twice weekly, once at home and once in office.  Donated kerecis was applied last visit.   Assessment & Plan: Visit Diagnoses: No diagnosis found.  Plan: We will resume compression wrapping as previously.  Dynaflex applied today.   Follow-Up Instructions: No follow-ups on file.   Ortho Exam  Patient is alert, oriented, no adenopathy, well-dressed, normal affect, normal respiratory effort. On examination of the right lower extremity she has a medial calf ulcer is filled in with granulation mounds. good incorporation of Kerecis powder. there is no surrounding erythema no warmth no drainage  Imaging: No results found. No images are attached to the encounter.  Labs: Lab Results  Component Value Date   HGBA1C 5.2 09/15/2020   REPTSTATUS 11/15/2021 FINAL 11/10/2021   GRAMSTAIN  11/10/2021    NO WBC SEEN RARE GRAM POSITIVE COCCI RARE GRAM POSITIVE RODS    CULT  11/10/2021    FEW METHICILLIN RESISTANT STAPHYLOCOCCUS AUREUS MODERATE CORYNEBACTERIUM STRIATUM Standardized susceptibility testing for this organism is not available. NO ANAEROBES ISOLATED Performed at [facility], [address] [place],  [address]    LABORGA METHICILLIN RESISTANT STAPHYLOCOCCUS AUREUS 11/10/2021     Lab Results  Component Value Date    ALBUMIN 4.2 09/15/2020   ALBUMIN 3.4 (L) 05/12/2019   ALBUMIN 2.5 (L) 10/08/2018    Lab Results  Component Value Date   MG 1.9 08/19/2019   MG 1.8 12/29/2016   MG 1.9 12/28/2016   Lab Results  Component Value Date   VD25OH 12.0 (L) 09/15/2020    No results found for: "PREALBUMIN"    Latest Ref Rng & Units 11/10/2021    5:54 AM 09/15/2020   11:03 AM 08/19/2019    7:24 AM  CBC EXTENDED  WBC 4.0 - 10.5 K/uL 5.2  4.5  7.5   RBC 3.87 - 5.11 MIL/uL 3.77  4.83  4.37   Hemoglobin 12.0 - 15.0 g/dL 10.6  13.2  12.3   HCT 36.0 - 46.0 % 31.8  40.0  36.7   Platelets 150 - 400 K/uL 195   236   NEUT# 1.4 - 7.0 x10E3/uL  2.5  5.2   Lymph# 0.7 - 3.1 x10E3/uL  1.3  1.5      There is no height or weight on file to calculate BMI.  Orders:  No orders of the defined types were placed in this encounter.  No orders of the defined types were placed in this encounter.    Procedures: No procedures performed  Clinical Data: No additional findings.  ROS:  All other systems negative, except as noted in the HPI. Review of Systems  Constitutional: Negative.   Skin:  Positive  for wound. Negative for color change.    Objective: Vital Signs: There were no vitals taken for this visit.  Specialty Comments:  No specialty comments available.  PMFS History: Patient Active Problem List   Diagnosis Date Noted   H/O skin graft 11/10/2021   Abscess of right lower leg    SOB (shortness of breath) on exertion 09/15/2020   Vitamin D deficiency 09/15/2020   Hyperglycemia 09/15/2020   Subtherapeutic international normalized ratio (INR)    Postoperative pain    Hypoalbuminemia    Acute on chronic kidney failure ([place])    Acute blood loss anemia    Benign essential HTN    Other fatigue 10/05/2018   Chronic ulcer of right leg, with fat layer exposed ([place]) 09/28/2018   Chronic ulcer of leg, right, with necrosis of muscle ([place])    Iron deficiency anemia 01/29/2018   Pulmonary embolus ([place])  01/09/2018   Encounter for therapeutic drug monitoring 01/09/2018   Severe malnutrition ([place]) 08/24/2017   Acute kidney injury superimposed on CKD ([place]) 12/28/2016   Hx of skin graft 08/01/2016   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 ([place]) 07/25/2016   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 ([place]) 07/05/2016   Trichomonal infection 11/24/2015   Abdominal pain 05/02/2015   Symptomatic cholelithiasis 04/16/2015   Acute bilateral upper abdominal pain 04/16/2015   Microcytic anemia 05/18/2013   Hypotension, unspecified 05/17/2013   Anemia due to stage 3b chronic kidney disease ([place]) 05/17/2013   Hypokalemia 05/17/2013   Anal fissure 02/06/2013   Anal skin tag 02/06/2013   ALLERGIC RHINITIS 03/05/2010   LOW BACK PAIN SYNDROME 12/13/2007   OBESITY, MORBID 12/02/2006   HTN (hypertension) 12/02/2006   History of pulmonary embolism 12/02/2006   History of DVT (deep vein thrombosis) 12/02/2006   SYNDROME, POSTPHLEBITIC W/ULCER & INFLM 12/02/2006   GASTROESOPHAGEAL REFLUX DISEASE 12/02/2006   OSA (obstructive sleep apnea) 12/02/2006   Past Medical History:  Diagnosis Date   Anemia    Anxiety    Arthritis    Chest pain    Chronic bronchitis (HCC)    Chronic upper back pain    Depression    Depression    DVT (deep venous thrombosis) (HCC)    BLE   GERD (gastroesophageal reflux disease)    Headache    "weekly" (04/16/2015)   Hyperlipidemia    Hypertension    Joint pain    Migraine    "monthly" (04/16/2015)   Morbid obesity with BMI of 50.0-59.9, adult ([place])    Obesity    Other fatigue    Pneumonia 2014   Pulmonary embolism (HCC)    Sinusitis nasal    Sleep apnea    "I'm suppose to wear a mask but I don't" (04/16/2015)   SOB (shortness of breath) on exertion    Varicose veins of right lower extremity    Venous stasis of lower extremity    right   Wears dentures    Wears glasses     Family History   Problem Relation Age of Onset   Kidney disease Mother        kidney transplant   Diabetes Mother    Hyperlipidemia Mother    Hypertension Mother    Stroke Mother    Diabetes Father    Hypertension Father    Hyperlipidemia Father    Stroke Father    Kidney disease Father    Schizophrenia Father    Alcoholism Father  Past Surgical History:  Procedure Laterality Date   APPLICATION OF WOUND VAC  11/10/2021   Procedure: APPLICATION OF WOUND VAC;  Surgeon: [name], MD;  Location: [facility];  Service: Orthopedics;;   CHOLECYSTECTOMY N/A 04/18/2015   Procedure: LAPAROSCOPIC CHOLECYSTECTOMY;  Surgeon: Ralene Ok, MD;  Location: [facility];  Service: General;  Laterality: N/A;   HYSTEROSCOPY WITH D & C  12/24/2001   Archie Endo 09/28/2010   I & D EXTREMITY Right 07/25/2016   Procedure: IRRIGATION AND DEBRIDEMENT RIGHT LEG ULCER, APPLY VERAFLO [place];  Surgeon: [name], MD;  Location: [facility];  Service: Orthopedics;  Laterality: Right;   I & D EXTREMITY Right 09/28/2018   Procedure: DEBRIDEMENT RIGHT LOWER LEG WITH PLACEMENT WITH PLACEMENT OF SKIN GRAFT AND VAC;  Surgeon: [name], MD;  Location: [facility];  Service: Orthopedics;  Laterality: Right;   I & D EXTREMITY Right 10/03/2018   Procedure: REPEAT IRRIGATION AND DEBRIDEMENT RIGHT LOWER LEG, VAC PLACEMENT;  Surgeon: [name], MD;  Location: [facility];  Service: Orthopedics;  Laterality: Right;   I & D EXTREMITY Right 11/10/2021   Procedure: RIGHT LEG DEBRIDEMENT AND APPLY SKIN GRAFT;  Surgeon: [name], MD;  Location: [facility];  Service: Orthopedics;  Laterality: Right;   INCISE AND DRAIN ABCESS Right 07/14/2016   INCISION AND DRAINAGE Right 09/10/2008   leg:  skin and soft tissue and muscle/notes 09/15/2010   INCISION AND DRAINAGE Right 01/01/2008   Chronic venous stasis insufficiency ulcer,/notes 09/14/2010/   INCISION AND DRAINAGE Right 0/9811   calf w/application wound vac/notes 07/20/2010   INCISION AND DRAINAGE Right 07/25/2016    IRRIGATION AND DEBRIDEMENT RIGHT LEG ULCER,   LAPAROSCOPIC GASTRIC BYPASS  ~ 2007   MULTIPLE TOOTH EXTRACTIONS     SKIN GRAFT SPLIT THICKNESS LEG / FOOT Right 07/25/2016   LEG   SKIN SPLIT GRAFT Right 07/27/2016   Procedure: SKIN GRAFT RIGHT LEG WITH THERASKIN APPLICATION;  Surgeon: [name], MD;  Location: [facility];  Service: Orthopedics;  Laterality: Right;   TONSILLECTOMY     Social History   Occupational History   Occupation: unemployed  Tobacco Use   Smoking status: Never   Smokeless tobacco: Never  Vaping Use   Vaping Use: Never used  Substance and Sexual Activity   Alcohol use: No   Drug use: No   Sexual activity: Yes    Partners: Male    Birth control/protection: I.U.D.

## 2022-01-27 DIAGNOSIS — D508 Other iron deficiency anemias: Secondary | ICD-10-CM | POA: Diagnosis not present

## 2022-01-27 DIAGNOSIS — G629 Polyneuropathy, unspecified: Secondary | ICD-10-CM | POA: Diagnosis not present

## 2022-01-27 DIAGNOSIS — Z7901 Long term (current) use of anticoagulants: Secondary | ICD-10-CM | POA: Diagnosis not present

## 2022-01-27 DIAGNOSIS — G4733 Obstructive sleep apnea (adult) (pediatric): Secondary | ICD-10-CM | POA: Diagnosis not present

## 2022-01-27 DIAGNOSIS — I10 Essential (primary) hypertension: Secondary | ICD-10-CM | POA: Diagnosis not present

## 2022-01-27 DIAGNOSIS — N1831 Chronic kidney disease, stage 3a: Secondary | ICD-10-CM | POA: Diagnosis not present

## 2022-01-27 DIAGNOSIS — R1013 Epigastric pain: Secondary | ICD-10-CM | POA: Diagnosis not present

## 2022-01-27 DIAGNOSIS — G8929 Other chronic pain: Secondary | ICD-10-CM | POA: Diagnosis not present

## 2022-01-27 DIAGNOSIS — M5442 Lumbago with sciatica, left side: Secondary | ICD-10-CM | POA: Diagnosis not present

## 2022-01-27 DIAGNOSIS — R7303 Prediabetes: Secondary | ICD-10-CM | POA: Diagnosis not present

## 2022-02-01 DIAGNOSIS — N184 Chronic kidney disease, stage 4 (severe): Secondary | ICD-10-CM | POA: Diagnosis not present

## 2022-02-07 ENCOUNTER — Other Ambulatory Visit (HOSPITAL_COMMUNITY): Payer: Self-pay | Admitting: Physician Assistant

## 2022-02-07 DIAGNOSIS — N189 Chronic kidney disease, unspecified: Secondary | ICD-10-CM

## 2022-02-08 ENCOUNTER — Other Ambulatory Visit (HOSPITAL_COMMUNITY): Payer: Self-pay | Admitting: Nephrology

## 2022-02-08 ENCOUNTER — Ambulatory Visit (HOSPITAL_COMMUNITY)
Admission: RE | Admit: 2022-02-08 | Discharge: 2022-02-08 | Disposition: A | Discharge status: Home / Self Care | Payer: Medicare Other | Source: Ambulatory Visit | Attending: Physician Assistant | Admitting: Physician Assistant

## 2022-02-08 ENCOUNTER — Ambulatory Visit (HOSPITAL_COMMUNITY)
Admission: RE | Admit: 2022-02-08 | Discharge: 2022-02-08 | Disposition: A | Discharge status: Home / Self Care | Payer: Medicare Other | Source: Ambulatory Visit | Attending: Nephrology | Admitting: Nephrology

## 2022-02-08 ENCOUNTER — Encounter (HOSPITAL_COMMUNITY): Payer: Self-pay

## 2022-02-08 ENCOUNTER — Other Ambulatory Visit: Payer: Self-pay

## 2022-02-08 ENCOUNTER — Emergency Department (HOSPITAL_COMMUNITY)
Admission: EM | Admit: 2022-02-08 | Discharge: 2022-02-08 | Discharge status: Against Medical Advice | Payer: Medicare Other

## 2022-02-08 ENCOUNTER — Ambulatory Visit (HOSPITAL_COMMUNITY)
Admission: RE | Admit: 2022-02-08 | Discharge: 2022-02-08 | Disposition: A | Discharge status: Home / Self Care | Payer: Medicare Other | Source: Ambulatory Visit | Attending: Interventional Radiology | Admitting: Interventional Radiology

## 2022-02-08 VITALS — BP 148/107 | HR 88 | Temp 98.4°F | Resp 17 | Ht 68.0 in | Wt 272.0 lb

## 2022-02-08 DIAGNOSIS — N184 Chronic kidney disease, stage 4 (severe): Secondary | ICD-10-CM | POA: Diagnosis not present

## 2022-02-08 DIAGNOSIS — R809 Proteinuria, unspecified: Secondary | ICD-10-CM | POA: Diagnosis not present

## 2022-02-08 DIAGNOSIS — N189 Chronic kidney disease, unspecified: Secondary | ICD-10-CM

## 2022-02-08 DIAGNOSIS — R1012 Left upper quadrant pain: Secondary | ICD-10-CM | POA: Diagnosis not present

## 2022-02-08 DIAGNOSIS — Z7901 Long term (current) use of anticoagulants: Secondary | ICD-10-CM | POA: Insufficient documentation

## 2022-02-08 DIAGNOSIS — G8918 Other acute postprocedural pain: Secondary | ICD-10-CM

## 2022-02-08 DIAGNOSIS — R109 Unspecified abdominal pain: Secondary | ICD-10-CM | POA: Diagnosis not present

## 2022-02-08 DIAGNOSIS — I130 Hypertensive heart and chronic kidney disease with heart failure and stage 1 through stage 4 chronic kidney disease, or unspecified chronic kidney disease: Secondary | ICD-10-CM | POA: Insufficient documentation

## 2022-02-08 DIAGNOSIS — R079 Chest pain, unspecified: Secondary | ICD-10-CM | POA: Diagnosis not present

## 2022-02-08 LAB — POCT I-STAT, CHEM 8
BUN: 56 mg/dL — ABNORMAL HIGH (ref 6–20)
Calcium, Ion: 0.95 mmol/L — ABNORMAL LOW (ref 1.15–1.40)
Chloride: 107 mmol/L (ref 98–111)
Creatinine, Ser: 3.5 mg/dL — ABNORMAL HIGH (ref 0.44–1.00)
Glucose, Bld: 86 mg/dL (ref 70–99)
HCT: 36 % (ref 36.0–46.0)
Hemoglobin: 12.2 g/dL (ref 12.0–15.0)
Potassium: 3.1 mmol/L — ABNORMAL LOW (ref 3.5–5.1)
Sodium: 142 mmol/L (ref 135–145)
TCO2: 25 mmol/L (ref 22–32)

## 2022-02-08 LAB — CBC
HCT: 33.2 % — ABNORMAL LOW (ref 36.0–46.0)
Hemoglobin: 11.2 g/dL — ABNORMAL LOW (ref 12.0–15.0)
MCH: 28.1 pg (ref 26.0–34.0)
MCHC: 33.7 g/dL (ref 30.0–36.0)
MCV: 83.4 fL (ref 80.0–100.0)
Platelets: 240 10*3/uL (ref 150–400)
RBC: 3.98 MIL/uL (ref 3.87–5.11)
RDW: 13.3 % (ref 11.5–15.5)
WBC: 5.3 10*3/uL (ref 4.0–10.5)
nRBC: 0 % (ref 0.0–0.2)

## 2022-02-08 LAB — PROTIME-INR
INR: 1.1 (ref 0.8–1.2)
Prothrombin Time: 14 seconds (ref 11.4–15.2)

## 2022-02-08 MED ORDER — LIDOCAINE HCL 1 % IJ SOLN: 10.0000 mL | Freq: Once | INTRAMUSCULAR | Status: DC

## 2022-02-08 MED ORDER — GELATIN ABSORBABLE 12-7 MM EX MISC: 1.0000 | Freq: Once | CUTANEOUS | Status: DC

## 2022-02-08 MED ORDER — MORPHINE SULFATE (PF) 2 MG/ML IV SOLN
INTRAVENOUS | Status: AC
  Filled 2022-02-08: qty 1

## 2022-02-08 MED ORDER — FENTANYL CITRATE (PF) 100 MCG/2ML IJ SOLN
INTRAMUSCULAR | Status: AC | PRN
  Administered 2022-02-08 (×2): 25 ug via INTRAVENOUS

## 2022-02-08 MED ORDER — IBUPROFEN 200 MG PO TABS
600.0000 mg | ORAL_TABLET | Freq: Once | ORAL | Status: DC
  Filled 2022-02-08 (×3): qty 1
  Filled 2022-02-08: qty 3

## 2022-02-08 MED ORDER — FENTANYL CITRATE (PF) 100 MCG/2ML IJ SOLN
INTRAMUSCULAR | Status: AC
  Filled 2022-02-08: qty 2

## 2022-02-08 MED ORDER — SODIUM CHLORIDE 0.9 % IV SOLN: INTRAVENOUS | Status: DC

## 2022-02-08 MED ORDER — FAMOTIDINE 20 MG PO TABS
20.0000 mg | ORAL_TABLET | Freq: Once | ORAL | Status: AC
  Administered 2022-02-08: 20 mg via ORAL
  Filled 2022-02-08: qty 1

## 2022-02-08 MED ORDER — MORPHINE SULFATE (PF) 2 MG/ML IV SOLN
1.0000 mg | Freq: Once | INTRAVENOUS | Status: AC
  Administered 2022-02-08: 1 mg via INTRAVENOUS
  Filled 2022-02-08: qty 0.5

## 2022-02-08 MED ORDER — MIDAZOLAM HCL 2 MG/2ML IJ SOLN
INTRAMUSCULAR | Status: AC
  Filled 2022-02-08: qty 2

## 2022-02-08 MED ORDER — MIDAZOLAM HCL 2 MG/2ML IJ SOLN
INTRAMUSCULAR | Status: AC | PRN
  Administered 2022-02-08 (×2): .5 mg via INTRAVENOUS

## 2022-02-08 NOTE — Progress Notes (Addendum)
Called by short stay RN due to patient c/o 10/10 chest pain post left renal biopsy today. Per patient the pain is located under her left breast and started when she was turned for the procedure. The pain does not radiate and she denies any numbness or tingling in her hands/arms. She denies arm pain, neck pain, headache, nausea, vomiting, dyspnea, coughing, wheezing, dizziness, vision changes and palpitations.   She denies ever having chest pain before however per chart review she was previously prescribed nitro for chest pain (not currently taking) and has a history of PE which also produced chest pain.   On exam patient is resting quietly with her eyes closed, RN and tech at bedside placing EKG leads. Patient follows commands appropriately, able to give appropriate history - states 10/10 chest pain when asked but otherwise does not offer complaints. States pain feels similar to gas pain but worse.  No diaphoresis noted. Skin is warm to touch.  Lung sounds clear bilaterally.  RRR BP 126/96 mmHg, HR 73 NSR, RR 16, 98-99% on RA  EKG obtained at bedside which shows NSR and no acute abnormalities.  2V CXR shows enlarged cardiac silhouette but no evidence of acute cardiopulmonary disease, no pleural effusion or pneumothorax seen.  Plan: - Pepcid AC 20 mg x 1 - Morphine 1 mg x 1 for acute pain control (patient with allergy to norco, oxycodone, dilaudid) - Bedrest - Will follow up after pain medication to assess symptoms, if 10/10 chest pain continues will consider CT to further evaluate  Candiss Norse, PA-C  ADDENDUM 13:25 Patient re-assessed at bedside with Dr. Dwaine Gale at approximately 12:30 pm, patient reported pain improved to 6-7/10 and noted to be worse with movement. Initial dose of morphine improved pain. Patient noted to have pain on palpation of LUQ and given patient reports of worse with movement/no radiation of pain this was felt to likely be musculoskeletal in nature. A second dose of  morphine was ordered for acute pain as well as ibuprofen 600 mg PO for inflammation. Patient planned for d/c this afternoon however after receiving second dose of morphine patient with significant worsening in pain - now 10/10, writhing in bed per RN, vital signs remain stable.  Discussed above with Dr. Dwaine Gale, will order CT abd/pelvis w/o contrast to further assess. Once CT obtained will review and discuss further plans with patient. RN instructed to send patient to ED for any acute decompensation in the interim.  ADDENDUM 15:27 CT abd/pelvis w/o contrast obtained and reviewed by Dr. Dwaine Gale, no acute abnormalities noted. Patient still c/o 10/10 LUQ and chest pain, unchanged since previous exam. Vital signs remain stable, no new symptoms. Discussed with Dr. Dwaine Gale, will send patient to the ED for further evaluation and pain management.

## 2022-02-08 NOTE — Progress Notes (Signed)
Call received from [facility] and no new orders noted

## 2022-02-08 NOTE — Progress Notes (Signed)
Client c/o 10/10 left chest discomfort states "under left breast"; [facility] notified and orders noted

## 2022-02-08 NOTE — Progress Notes (Signed)
Patient was took to ER. ER charge nurse told patient that she would have to go to triage. Charge nurse stated that it was a 16 hour wait and there was no beds in the ER. That was the reason she would have to go to triage. Patient refused to stay in ER and was brought back to short stay to be discharged.

## 2022-02-08 NOTE — Procedures (Signed)
Interventional Radiology Procedure Note  Procedure: CT guided random renal biopsy  Indication: Chronic renal failure  Findings: Please refer to procedural dictation for full description.  Complications: None  EBL: < 10 mL  Miachel Roux, MD [phone]

## 2022-02-08 NOTE — H&P (Signed)
Chief Complaint: Patient was seen in consultation today for random renal biopsy at the request of Singh,Vikas  Referring Physician(s): Singh,Vikas  Supervising Physician: Mir, Biochemist, clinical  Patient Status: [facility] - Out-pt  History of Present Illness: Alice [name] is a 51 y.o. female    Chronic kidney disease stage 4 Worsening renal function Proteinuria  Long Hx HTN Follows with Dr Loyal Gambler  Scheduled today for random renal biopsy  LD Eliquis Sat pm   Past Medical History:  Diagnosis Date   Anemia    Anxiety    Arthritis    Chest pain    Chronic bronchitis (HCC)    Chronic upper back pain    Depression    Depression    DVT (deep venous thrombosis) ([place])    BLE   GERD (gastroesophageal reflux disease)    Headache    "weekly" (04/16/2015)   Hyperlipidemia    Hypertension    Joint pain    Migraine    "monthly" (04/16/2015)   Morbid obesity with BMI of 50.0-59.9, adult ([place])    Obesity    Other fatigue    Pneumonia 2014   Pulmonary embolism ([place])    Sinusitis nasal    Sleep apnea    "I'm suppose to wear a mask but I don't" (04/16/2015)   SOB (shortness of breath) on exertion    Varicose veins of right lower extremity    Venous stasis of lower extremity    right   Wears dentures    Wears glasses     Past Surgical History:  Procedure Laterality Date   APPLICATION OF WOUND VAC  11/10/2021   Procedure: APPLICATION OF WOUND VAC;  Surgeon: [name], MD;  Location: [facility];  Service: Orthopedics;;   CHOLECYSTECTOMY N/A 04/18/2015   Procedure: LAPAROSCOPIC CHOLECYSTECTOMY;  Surgeon: Ralene Ok, MD;  Location: [facility];  Service: General;  Laterality: N/A;   HYSTEROSCOPY WITH D & C  12/24/2001   Archie Endo 09/28/2010   I & D EXTREMITY Right 07/25/2016   Procedure: IRRIGATION AND DEBRIDEMENT RIGHT LEG ULCER, APPLY VERAFLO [facility];  Surgeon: [name], MD;  Location: [facility];  Service: Orthopedics;  Laterality: Right;   I & D EXTREMITY Right 09/28/2018   Procedure:  DEBRIDEMENT RIGHT LOWER LEG WITH PLACEMENT WITH PLACEMENT OF SKIN GRAFT AND VAC;  Surgeon: [name], MD;  Location: [facility];  Service: Orthopedics;  Laterality: Right;   I & D EXTREMITY Right 10/03/2018   Procedure: REPEAT IRRIGATION AND DEBRIDEMENT RIGHT LOWER LEG, VAC PLACEMENT;  Surgeon: [name], MD;  Location: [facility];  Service: Orthopedics;  Laterality: Right;   I & D EXTREMITY Right 11/10/2021   Procedure: RIGHT LEG DEBRIDEMENT AND APPLY SKIN GRAFT;  Surgeon: [name], MD;  Location: [facility];  Service: Orthopedics;  Laterality: Right;   INCISE AND DRAIN ABCESS Right 07/14/2016   INCISION AND DRAINAGE Right 09/10/2008   leg:  skin and soft tissue and muscle/notes 09/15/2010   INCISION AND DRAINAGE Right 01/01/2008   Chronic venous stasis insufficiency ulcer,/notes 09/14/2010/   INCISION AND DRAINAGE Right 07/5595   calf w/application wound vac/notes 07/20/2010   INCISION AND DRAINAGE Right 07/25/2016   IRRIGATION AND DEBRIDEMENT RIGHT LEG ULCER,   LAPAROSCOPIC GASTRIC BYPASS  ~ 2007   MULTIPLE TOOTH EXTRACTIONS     SKIN GRAFT SPLIT THICKNESS LEG / FOOT Right 07/25/2016   LEG   SKIN SPLIT GRAFT Right 07/27/2016   Procedure: SKIN GRAFT RIGHT LEG WITH  THERASKIN APPLICATION;  Surgeon: [name], MD;  Location: [facility];  Service: Orthopedics;  Laterality: Right;   TONSILLECTOMY      Allergies: Vicodin [hydrocodone-acetaminophen]  Medications: Prior to Admission medications   Medication Sig Start Date End Date Taking? Authorizing Provider  albuterol (PROVENTIL HFA;VENTOLIN HFA) 108 (90 Base) MCG/ACT inhaler Inhale 2 puffs into the lungs every 6 (six) hours as needed for wheezing or shortness of breath.    Yes [provider]  amLODipine (NORVASC) 10 MG tablet Take 1 tablet (10 mg total) by mouth daily. 05/20/13  Yes [name], MD  ELIQUIS 2.5 MG TABS tablet TAKE 1 TABLET(2.5 MG) BY MOUTH TWICE DAILY. FOLLOW-UP AS NEEDED FOR MORE REFILLS 08/24/20  Yes Dorothy Spark, MD  furosemide (LASIX) 40 MG tablet Take 40 mg by mouth 2 (two) times daily. 01/10/22  Yes [provider]  gabapentin (NEURONTIN) 400 MG capsule Take 400 mg by mouth 3 (three) times daily. 10/19/21  Yes [provider]  OZEMPIC, 0.25 OR 0.5 MG/DOSE, 2 MG/3ML SOPN Inject 0.5 mg into the skin every Friday. 10/15/21  Yes [provider]  tiZANidine (ZANAFLEX) 4 MG tablet Take 4-8 mg by mouth every 8 (eight) hours as needed for muscle spasms.   Yes [provider]  doxycycline (VIBRA-TABS) 100 MG tablet Take 1 tablet (100 mg total) by mouth 2 (two) times daily. Patient not taking: Reported on 02/07/2022 11/12/21   [name], MD  HYDROcodone-acetaminophen (NORCO/VICODIN) 5-325 MG tablet Take 1 tablet by mouth every 4 (four) hours as needed. Patient not taking: Reported on 02/07/2022 11/11/21   [name], MD  nitroGLYCERIN (NITROSTAT) 0.4 MG SL tablet Place 1 tablet (0.4 mg total) under the tongue every 5 (five) minutes x 3 doses as needed for chest pain. Patient not taking: Reported on 02/07/2022 01/03/17   Mendel Corning, MD     Family History  Problem Relation Age of Onset   Kidney disease Mother        kidney transplant   Diabetes Mother    Hyperlipidemia Mother    Hypertension Mother    Stroke Mother    Diabetes Father    Hypertension Father    Hyperlipidemia Father    Stroke Father    Kidney disease Father    Schizophrenia Father    Alcoholism Father     Social History   Socioeconomic History   Marital status: Single    Spouse name: Not on file   Number of children: Not on file   Years of education: Not on file   Highest education level: Not on file  Occupational History   Occupation: unemployed  Tobacco Use   Smoking status: Never   Smokeless tobacco: Never  Vaping Use   Vaping Use: Never used  Substance and Sexual Activity   Alcohol use: No   Drug use: No   Sexual activity: Yes    Partners: Male    Birth control/protection:  I.U.D.  Other Topics Concern   Not on file  Social History Narrative   Not on file   Social Determinants of Health   Financial Resource Strain: Not on file  Food Insecurity: Food Insecurity Present (12/29/2020)   Hunger Vital Sign    Worried About Running Out of Food in the Last Year: Often true    Ran Out of Food in the Last Year: Often true  Transportation Needs: Unmet Transportation Needs (12/29/2020)   PRAPARE - Transportation  Lack of Transportation (Medical): Yes    Lack of Transportation (Non-Medical): Yes  Physical Activity: Not on file  Stress: Not on file  Social Connections: Not on file    Review of Systems: A 12 point ROS discussed and pertinent positives are indicated in the HPI above.  All other systems are negative.  Review of Systems  Constitutional:  Negative for activity change, appetite change, fatigue and fever.  Respiratory:  Negative for cough and shortness of breath.   Cardiovascular:  Negative for chest pain and leg swelling.  Gastrointestinal:  Negative for nausea and vomiting.  Psychiatric/Behavioral:  Negative for behavioral problems and confusion.     Vital Signs: BP 119/77   Pulse 78   Temp 98.4 F (36.9 C) (Oral)   Resp 18   Ht 5\' 8"  (1.727 m)   Wt 272 lb (123.4 kg)   SpO2 96%   BMI 41.36 kg/m     Physical Exam Vitals reviewed.  Constitutional:      Comments: Though sleepy this am  HENT:     Mouth/Throat:     Mouth: Mucous membranes are moist.  Cardiovascular:     Rate and Rhythm: Normal rate. Rhythm irregular.     Heart sounds: No murmur heard. Pulmonary:     Effort: Pulmonary effort is normal.     Breath sounds: Normal breath sounds.  Abdominal:     Palpations: Abdomen is soft.     Tenderness: There is no abdominal tenderness.  Musculoskeletal:        General: Normal range of motion.     Right lower leg: No edema.     Left lower leg: No edema.  Skin:    General: Skin is warm.  Neurological:     Mental Status: She is  alert and oriented to person, place, and time.  Psychiatric:        Behavior: Behavior normal.     Imaging: No results found.  Labs:  CBC: Recent Labs    11/10/21 0554  WBC 5.2  HGB 10.6*  HCT 31.8*  PLT 195    COAGS: No results for input(s): "INR", "APTT" in the last 8760 hours.  BMP: Recent Labs    11/10/21 0554  NA 140  K 4.0  CL 109  CO2 21*  GLUCOSE 96  BUN 41*  CALCIUM 8.3*  CREATININE 3.24*  GFRNONAA 17*    LIVER FUNCTION TESTS: No results for input(s): "BILITOT", "AST", "ALT", "ALKPHOS", "PROT", "ALBUMIN" in the last 8760 hours.  TUMOR MARKERS: No results for input(s): "AFPTM", "CEA", "CA199", "CHROMGRNA" in the last 8760 hours.  Assessment and Plan:  Long Hx HTN; proteinuria CKD 4 Worsening renal function-- follows with Dr Loyal Gambler Scheduled for random renal biopsy Risks and benefits of random renal biopsy was discussed with the patient and/or patient's family including, but not limited to bleeding, infection, damage to adjacent structures or low yield requiring additional tests.  All of the questions were answered and there is agreement to proceed.  Consent signed and in chart.   Thank you for this interesting consult.  I greatly enjoyed meeting Alice [name] and look forward to participating in their care.  A copy of this report was sent to the requesting provider on this date.  Electronically Signed: Lavonia Drafts, PA-C 02/08/2022, 6:53 AM   I spent a total of  30 Minutes   in face to face in clinical consultation, greater than 50% of which was counseling/coordinating care for random renal biopsy

## 2022-02-08 NOTE — Progress Notes (Signed)
Client states pain is now worse after morphine; client crying with c/o pain 10/10

## 2022-02-09 DIAGNOSIS — I129 Hypertensive chronic kidney disease with stage 1 through stage 4 chronic kidney disease, or unspecified chronic kidney disease: Secondary | ICD-10-CM | POA: Diagnosis not present

## 2022-02-09 DIAGNOSIS — N2581 Secondary hyperparathyroidism of renal origin: Secondary | ICD-10-CM | POA: Diagnosis not present

## 2022-02-09 DIAGNOSIS — R809 Proteinuria, unspecified: Secondary | ICD-10-CM | POA: Diagnosis not present

## 2022-02-09 DIAGNOSIS — N184 Chronic kidney disease, stage 4 (severe): Secondary | ICD-10-CM | POA: Diagnosis not present

## 2022-02-09 DIAGNOSIS — D631 Anemia in chronic kidney disease: Secondary | ICD-10-CM | POA: Diagnosis not present

## 2022-02-14 ENCOUNTER — Encounter (HOSPITAL_COMMUNITY): Payer: Self-pay

## 2022-02-14 LAB — SURGICAL PATHOLOGY

## 2022-02-23 ENCOUNTER — Ambulatory Visit (INDEPENDENT_AMBULATORY_CARE_PROVIDER_SITE_OTHER): Payer: Medicare Other | Admitting: Family

## 2022-02-23 DIAGNOSIS — I87331 Chronic venous hypertension (idiopathic) with ulcer and inflammation of right lower extremity: Secondary | ICD-10-CM

## 2022-02-23 DIAGNOSIS — Z9889 Other specified postprocedural states: Secondary | ICD-10-CM

## 2022-02-23 NOTE — Progress Notes (Signed)
Office Visit Note   Patient: Alice [name]           Date of Birth: [birth year]           MRN: [id number] Visit Date: 02/23/2022              Requested by: Benito Mccreedy, MD [address] [place],  [place] [address] PCP: Benito Mccreedy, MD  Chief Complaint  Patient presents with   Right Leg - Follow-up    11/10/2021 RLE debridement and Kerecis graft       HPI: The patient is a 51 year old woman seen status post right lower extremity irrigation debridement with Kerecis placement on June 28 in the operation room.  She has been undergoing in office Kerecis since placement and weekly Dynaflex compression wraps  Voices no concerns.   Assessment & Plan: Visit Diagnoses: No diagnosis found.  Plan: Continue with current plan compression wraps with Dynaflex weekly. Will set up for home delivery of wound care supplies so she can apply her Dynaflex and dressing changes at home.  Follow-Up Instructions: No follow-ups on file.   Ortho Exam  Patient is alert, oriented, no adenopathy, well-dressed, normal affect, normal respiratory effort. On examination of the right lower extremity she has a medial calf ulcer is filled in with granulation mounds minimal fibrinous tissue. there is no surrounding erythema no warmth no drainage  Imaging: No results found. No images are attached to the encounter.  Labs: Lab Results  Component Value Date   HGBA1C 5.2 09/15/2020   REPTSTATUS 11/15/2021 FINAL 11/10/2021   GRAMSTAIN  11/10/2021    NO WBC SEEN RARE GRAM POSITIVE COCCI RARE GRAM POSITIVE RODS    CULT  11/10/2021    FEW METHICILLIN RESISTANT STAPHYLOCOCCUS AUREUS MODERATE CORYNEBACTERIUM STRIATUM Standardized susceptibility testing for this organism is not available. NO ANAEROBES ISOLATED Performed at [facility], [place] [address] [place], [place] [address]    LABORGA METHICILLIN RESISTANT STAPHYLOCOCCUS AUREUS 11/10/2021     Lab Results   Component Value Date   ALBUMIN 4.2 09/15/2020   ALBUMIN 3.4 (L) 05/12/2019   ALBUMIN 2.5 (L) 10/08/2018    Lab Results  Component Value Date   MG 1.9 08/19/2019   MG 1.8 12/29/2016   MG 1.9 12/28/2016   Lab Results  Component Value Date   VD25OH 12.0 (L) 09/15/2020    No results found for: "PREALBUMIN"    Latest Ref Rng & Units 02/08/2022    2:10 PM 02/08/2022    6:30 AM 11/10/2021    5:54 AM  CBC EXTENDED  WBC 4.0 - 10.5 K/uL  5.3  5.2   RBC 3.87 - 5.11 MIL/uL  3.98  3.77   Hemoglobin 12.0 - 15.0 g/dL 12.2  11.2  10.6   HCT 36.0 - 46.0 % 36.0  33.2  31.8   Platelets 150 - 400 K/uL  240  195      There is no height or weight on file to calculate BMI.  Orders:  No orders of the defined types were placed in this encounter.  No orders of the defined types were placed in this encounter.    Procedures: No procedures performed  Clinical Data: No additional findings.  ROS:  All other systems negative, except as noted in the HPI. Review of Systems  Constitutional: Negative.   Skin:  Positive for wound. Negative for color change.    Objective: Vital Signs: There were no vitals taken for this visit.  Specialty Comments:  No specialty comments available.  PMFS History: Patient Active Problem List   Diagnosis Date Noted   H/O skin graft 11/10/2021   Abscess of right lower leg    SOB (shortness of breath) on exertion 09/15/2020   Vitamin D deficiency 09/15/2020   Hyperglycemia 09/15/2020   Subtherapeutic international normalized ratio (INR)    Postoperative pain    Hypoalbuminemia    Acute on chronic kidney failure ([place])    Acute blood loss anemia    Benign essential HTN    Other fatigue 10/05/2018   Chronic ulcer of right leg, with fat layer exposed ([place]) 09/28/2018   Chronic ulcer of leg, right, with necrosis of muscle ([place])    Iron deficiency anemia 01/29/2018   Pulmonary embolus ([place]) 01/09/2018   Encounter for therapeutic drug monitoring  01/09/2018   Severe malnutrition ([place]) 08/24/2017   Acute kidney injury superimposed on CKD ([place]) 12/28/2016   Hx of skin graft 08/01/2016   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 ([place]) 07/25/2016   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 ([place]) 07/05/2016   Trichomonal infection 11/24/2015   Abdominal pain 05/02/2015   Symptomatic cholelithiasis 04/16/2015   Acute bilateral upper abdominal pain 04/16/2015   Microcytic anemia 05/18/2013   Hypotension, unspecified 05/17/2013   Anemia due to stage 3b chronic kidney disease ([place]) 05/17/2013   Hypokalemia 05/17/2013   Anal fissure 02/06/2013   Anal skin tag 02/06/2013   ALLERGIC RHINITIS 03/05/2010   LOW BACK PAIN SYNDROME 12/13/2007   OBESITY, MORBID 12/02/2006   HTN (hypertension) 12/02/2006   History of pulmonary embolism 12/02/2006   History of DVT (deep vein thrombosis) 12/02/2006   SYNDROME, POSTPHLEBITIC W/ULCER & INFLM 12/02/2006   GASTROESOPHAGEAL REFLUX DISEASE 12/02/2006   OSA (obstructive sleep apnea) 12/02/2006   Past Medical History:  Diagnosis Date   Anemia    Anxiety    Arthritis    Chest pain    Chronic bronchitis (HCC)    Chronic upper back pain    Depression    Depression    DVT (deep venous thrombosis) (HCC)    BLE   GERD (gastroesophageal reflux disease)    Headache    "weekly" (04/16/2015)   Hyperlipidemia    Hypertension    Joint pain    Migraine    "monthly" (04/16/2015)   Morbid obesity with BMI of 50.0-59.9, adult ([place])    Obesity    Other fatigue    Pneumonia 2014   Pulmonary embolism (HCC)    Sinusitis nasal    Sleep apnea    "I'm suppose to wear a mask but I don't" (04/16/2015)   SOB (shortness of breath) on exertion    Varicose veins of right lower extremity    Venous stasis of lower extremity    right   Wears dentures    Wears glasses     Family History  Problem Relation Age of Onset   Kidney disease Mother         kidney transplant   Diabetes Mother    Hyperlipidemia Mother    Hypertension Mother    Stroke Mother    Diabetes Father    Hypertension Father    Hyperlipidemia Father    Stroke Father    Kidney disease Father    Schizophrenia Father    Alcoholism Father     Past Surgical History:  Procedure Laterality Date   APPLICATION OF WOUND VAC  11/10/2021  Procedure: APPLICATION OF WOUND VAC;  Surgeon: [name], MD;  Location: [facility];  Service: Orthopedics;;   CHOLECYSTECTOMY N/A 04/18/2015   Procedure: LAPAROSCOPIC CHOLECYSTECTOMY;  Surgeon: Ralene Ok, MD;  Location: [facility];  Service: General;  Laterality: N/A;   HYSTEROSCOPY WITH D & C  12/24/2001   Archie Endo 09/28/2010   I & D EXTREMITY Right 07/25/2016   Procedure: IRRIGATION AND DEBRIDEMENT RIGHT LEG ULCER, APPLY VERAFLO [place];  Surgeon: [name], MD;  Location: [facility];  Service: Orthopedics;  Laterality: Right;   I & D EXTREMITY Right 09/28/2018   Procedure: DEBRIDEMENT RIGHT LOWER LEG WITH PLACEMENT WITH PLACEMENT OF SKIN GRAFT AND VAC;  Surgeon: [name], MD;  Location: [facility];  Service: Orthopedics;  Laterality: Right;   I & D EXTREMITY Right 10/03/2018   Procedure: REPEAT IRRIGATION AND DEBRIDEMENT RIGHT LOWER LEG, VAC PLACEMENT;  Surgeon: [name], MD;  Location: [facility];  Service: Orthopedics;  Laterality: Right;   I & D EXTREMITY Right 11/10/2021   Procedure: RIGHT LEG DEBRIDEMENT AND APPLY SKIN GRAFT;  Surgeon: [name], MD;  Location: [facility];  Service: Orthopedics;  Laterality: Right;   INCISE AND DRAIN ABCESS Right 07/14/2016   INCISION AND DRAINAGE Right 09/10/2008   leg:  skin and soft tissue and muscle/notes 09/15/2010   INCISION AND DRAINAGE Right 01/01/2008   Chronic venous stasis insufficiency ulcer,/notes 09/14/2010/   INCISION AND DRAINAGE Right 08/3327   calf w/application wound vac/notes 07/20/2010   INCISION AND DRAINAGE Right 07/25/2016   IRRIGATION AND DEBRIDEMENT RIGHT LEG ULCER,   LAPAROSCOPIC  GASTRIC BYPASS  ~ 2007   MULTIPLE TOOTH EXTRACTIONS     SKIN GRAFT SPLIT THICKNESS LEG / FOOT Right 07/25/2016   LEG   SKIN SPLIT GRAFT Right 07/27/2016   Procedure: SKIN GRAFT RIGHT LEG WITH THERASKIN APPLICATION;  Surgeon: [name], MD;  Location: [facility];  Service: Orthopedics;  Laterality: Right;   TONSILLECTOMY     Social History   Occupational History   Occupation: unemployed  Tobacco Use   Smoking status: Never   Smokeless tobacco: Never  Vaping Use   Vaping Use: Never used  Substance and Sexual Activity   Alcohol use: No   Drug use: No   Sexual activity: Yes    Partners: Male    Birth control/protection: I.U.D.

## 2022-02-28 DIAGNOSIS — N184 Chronic kidney disease, stage 4 (severe): Secondary | ICD-10-CM | POA: Diagnosis not present

## 2022-03-01 DIAGNOSIS — L97214 Non-pressure chronic ulcer of right calf with necrosis of bone: Secondary | ICD-10-CM | POA: Diagnosis not present

## 2022-03-01 DIAGNOSIS — I87311 Chronic venous hypertension (idiopathic) with ulcer of right lower extremity: Secondary | ICD-10-CM | POA: Diagnosis not present

## 2022-03-08 DIAGNOSIS — N2581 Secondary hyperparathyroidism of renal origin: Secondary | ICD-10-CM | POA: Diagnosis not present

## 2022-03-08 DIAGNOSIS — D631 Anemia in chronic kidney disease: Secondary | ICD-10-CM | POA: Diagnosis not present

## 2022-03-08 DIAGNOSIS — I120 Hypertensive chronic kidney disease with stage 5 chronic kidney disease or end stage renal disease: Secondary | ICD-10-CM | POA: Diagnosis not present

## 2022-03-08 DIAGNOSIS — N185 Chronic kidney disease, stage 5: Secondary | ICD-10-CM | POA: Diagnosis not present

## 2022-03-08 DIAGNOSIS — R809 Proteinuria, unspecified: Secondary | ICD-10-CM | POA: Diagnosis not present

## 2022-03-11 ENCOUNTER — Encounter: Payer: Self-pay | Admitting: Family

## 2022-03-22 DIAGNOSIS — M5442 Lumbago with sciatica, left side: Secondary | ICD-10-CM | POA: Diagnosis not present

## 2022-03-22 DIAGNOSIS — Z7901 Long term (current) use of anticoagulants: Secondary | ICD-10-CM | POA: Diagnosis not present

## 2022-03-22 DIAGNOSIS — G8929 Other chronic pain: Secondary | ICD-10-CM | POA: Diagnosis not present

## 2022-03-22 DIAGNOSIS — G4733 Obstructive sleep apnea (adult) (pediatric): Secondary | ICD-10-CM | POA: Diagnosis not present

## 2022-03-22 DIAGNOSIS — G629 Polyneuropathy, unspecified: Secondary | ICD-10-CM | POA: Diagnosis not present

## 2022-03-22 DIAGNOSIS — Z0001 Encounter for general adult medical examination with abnormal findings: Secondary | ICD-10-CM | POA: Diagnosis not present

## 2022-03-22 DIAGNOSIS — R7303 Prediabetes: Secondary | ICD-10-CM | POA: Diagnosis not present

## 2022-03-22 DIAGNOSIS — I10 Essential (primary) hypertension: Secondary | ICD-10-CM | POA: Diagnosis not present

## 2022-03-22 DIAGNOSIS — N1831 Chronic kidney disease, stage 3a: Secondary | ICD-10-CM | POA: Diagnosis not present

## 2022-03-22 DIAGNOSIS — R1013 Epigastric pain: Secondary | ICD-10-CM | POA: Diagnosis not present

## 2022-03-28 DIAGNOSIS — L97214 Non-pressure chronic ulcer of right calf with necrosis of bone: Secondary | ICD-10-CM | POA: Diagnosis not present

## 2022-03-28 DIAGNOSIS — N185 Chronic kidney disease, stage 5: Secondary | ICD-10-CM | POA: Diagnosis not present

## 2022-03-28 DIAGNOSIS — I87311 Chronic venous hypertension (idiopathic) with ulcer of right lower extremity: Secondary | ICD-10-CM | POA: Diagnosis not present

## 2022-03-29 DIAGNOSIS — I87311 Chronic venous hypertension (idiopathic) with ulcer of right lower extremity: Secondary | ICD-10-CM | POA: Diagnosis not present

## 2022-03-29 DIAGNOSIS — L97214 Non-pressure chronic ulcer of right calf with necrosis of bone: Secondary | ICD-10-CM | POA: Diagnosis not present

## 2022-03-30 ENCOUNTER — Encounter: Payer: Self-pay | Admitting: Family

## 2022-03-30 ENCOUNTER — Ambulatory Visit (INDEPENDENT_AMBULATORY_CARE_PROVIDER_SITE_OTHER): Payer: Medicare Other | Admitting: Family

## 2022-03-30 DIAGNOSIS — I87331 Chronic venous hypertension (idiopathic) with ulcer and inflammation of right lower extremity: Secondary | ICD-10-CM | POA: Diagnosis not present

## 2022-03-30 DIAGNOSIS — Z9889 Other specified postprocedural states: Secondary | ICD-10-CM | POA: Diagnosis not present

## 2022-03-30 NOTE — Progress Notes (Addendum)
Office Visit Note   Patient: Alice [name]           Date of Birth: 1970/07/12           MRN: [id number] Visit Date: 03/30/2022              Requested by: Benito Mccreedy, MD [place] [address] [place],  [place] [address] PCP: Benito Mccreedy, MD  Chief Complaint  Patient presents with   Right Leg - Follow-up    11/10/2021 RLE debridement and Kerecis graft          HPI: The patient is a 51 year old woman seen status post right lower extremity irrigation debridement with Kerecis placement on June 28 in OR.   Has undergone 1 in office donation graft placement of Kerecis as well. Has been doing home dynaflex wraps independently for last several weeks.   Voices no concerns.   Assessment & Plan: Visit Diagnoses:  1. 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 History of artificial skin graft     Plan: dynaflex applied today. Continue with current plan compression wraps with Dynaflex twice weekly. Will set up for home delivery of wound care supplies so she can apply her Dynaflex and dressing changes at home.  Follow-Up Instructions: Return in about 4 weeks (around 04/27/2022).   Ortho Exam  Patient is alert, oriented, no adenopathy, well-dressed, normal affect, normal respiratory effort. On examination of the right lower extremity she has a medial calf ulcer is filled in with granulation with circumferential epithelialization. Now measures 4 cm x 2.5 cm. No fibrinous tissue. there is no surrounding erythema no warmth no drainage  Imaging: No results found. No images are attached to the encounter.  Labs: Lab Results  Component Value Date   HGBA1C 5.2 09/15/2020   REPTSTATUS 11/15/2021 FINAL 11/10/2021   GRAMSTAIN  11/10/2021    NO WBC SEEN RARE GRAM POSITIVE COCCI RARE GRAM POSITIVE RODS    CULT  11/10/2021    FEW METHICILLIN RESISTANT STAPHYLOCOCCUS AUREUS MODERATE CORYNEBACTERIUM  STRIATUM Standardized susceptibility testing for this organism is not available. NO ANAEROBES ISOLATED Performed at [facility], [place] [address] [place], [place] [address]    LABORGA METHICILLIN RESISTANT STAPHYLOCOCCUS AUREUS 11/10/2021     Lab Results  Component Value Date   ALBUMIN 4.2 09/15/2020   ALBUMIN 3.4 (L) 05/12/2019   ALBUMIN 2.5 (L) 10/08/2018    Lab Results  Component Value Date   MG 1.9 08/19/2019   MG 1.8 12/29/2016   MG 1.9 12/28/2016   Lab Results  Component Value Date   VD25OH 12.0 (L) 09/15/2020    No results found for: "PREALBUMIN"    Latest Ref Rng & Units 02/08/2022    2:10 PM 02/08/2022    6:30 AM 11/10/2021    5:54 AM  CBC EXTENDED  WBC 4.0 - 10.5 K/uL  5.3  5.2   RBC 3.87 - 5.11 MIL/uL  3.98  3.77   Hemoglobin 12.0 - 15.0 g/dL 12.2  11.2  10.6   HCT 36.0 - 46.0 % 36.0  33.2  31.8   Platelets 150 - 400 K/uL  240  195      There is no height or weight on file to calculate BMI.  Orders:  No orders of the defined types were placed in this encounter.  No orders of the defined types were placed in this encounter.    Procedures: No procedures performed  Clinical Data: No additional findings.  ROS:  All other systems negative, except as noted in the HPI. Review of Systems  Constitutional: Negative.   Skin:  Positive for wound. Negative for color change.    Objective: Vital Signs: There were no vitals taken for this visit.  Specialty Comments:  No specialty comments available.  PMFS History: Patient Active Problem List   Diagnosis Date Noted   H/O skin graft 11/10/2021   Abscess of right lower leg    SOB (shortness of breath) on exertion 09/15/2020   Vitamin D deficiency 09/15/2020   Hyperglycemia 09/15/2020   Subtherapeutic international normalized ratio (INR)    Postoperative pain    Hypoalbuminemia    Acute on chronic kidney failure ([place])    Acute blood loss anemia    Benign essential HTN    Other fatigue  10/05/2018   Chronic ulcer of right leg, with fat layer exposed ([place]) 09/28/2018   Chronic ulcer of leg, right, with necrosis of muscle ([place])    Iron deficiency anemia 01/29/2018   Pulmonary embolus ([place]) 01/09/2018   Encounter for therapeutic drug monitoring 01/09/2018   Severe malnutrition ([place]) 08/24/2017   Acute kidney injury superimposed on CKD ([place]) 12/28/2016   Hx of skin graft 08/01/2016   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 ([place]) 07/25/2016   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 ([place]) 07/05/2016   Trichomonal infection 11/24/2015   Abdominal pain 05/02/2015   Symptomatic cholelithiasis 04/16/2015   Acute bilateral upper abdominal pain 04/16/2015   Microcytic anemia 05/18/2013   Hypotension, unspecified 05/17/2013   Anemia due to stage 3b chronic kidney disease ([place]) 05/17/2013   Hypokalemia 05/17/2013   Anal fissure 02/06/2013   Anal skin tag 02/06/2013   ALLERGIC RHINITIS 03/05/2010   LOW BACK PAIN SYNDROME 12/13/2007   OBESITY, MORBID 12/02/2006   HTN (hypertension) 12/02/2006   History of pulmonary embolism 12/02/2006   History of DVT (deep vein thrombosis) 12/02/2006   SYNDROME, POSTPHLEBITIC W/ULCER & INFLM 12/02/2006   GASTROESOPHAGEAL REFLUX DISEASE 12/02/2006   OSA (obstructive sleep apnea) 12/02/2006   Past Medical History:  Diagnosis Date   Anemia    Anxiety    Arthritis    Chest pain    Chronic bronchitis (HCC)    Chronic upper back pain    Depression    Depression    DVT (deep venous thrombosis) (HCC)    BLE   GERD (gastroesophageal reflux disease)    Headache    "weekly" (04/16/2015)   Hyperlipidemia    Hypertension    Joint pain    Migraine    "monthly" (04/16/2015)   Morbid obesity with BMI of 50.0-59.9, adult ([place])    Obesity    Other fatigue    Pneumonia 2014   Pulmonary embolism (HCC)    Sinusitis nasal    Sleep apnea    "I'm suppose to wear a mask but I  don't" (04/16/2015)   SOB (shortness of breath) on exertion    Varicose veins of right lower extremity    Venous stasis of lower extremity    right   Wears dentures    Wears glasses     Family History  Problem Relation Age of Onset   Kidney disease Mother        kidney transplant   Diabetes Mother    Hyperlipidemia Mother    Hypertension Mother    Stroke Mother    Diabetes Father  Hypertension Father    Hyperlipidemia Father    Stroke Father    Kidney disease Father    Schizophrenia Father    Alcoholism Father     Past Surgical History:  Procedure Laterality Date   APPLICATION OF WOUND VAC  11/10/2021   Procedure: APPLICATION OF WOUND VAC;  Surgeon: [name], MD;  Location: [facility];  Service: Orthopedics;;   CHOLECYSTECTOMY N/A 04/18/2015   Procedure: LAPAROSCOPIC CHOLECYSTECTOMY;  Surgeon: Ralene Ok, MD;  Location: [facility];  Service: General;  Laterality: N/A;   HYSTEROSCOPY WITH D & C  12/24/2001   Archie Endo 09/28/2010   I & D EXTREMITY Right 07/25/2016   Procedure: IRRIGATION AND DEBRIDEMENT RIGHT LEG ULCER, APPLY VERAFLO [place];  Surgeon: [name], MD;  Location: [facility];  Service: Orthopedics;  Laterality: Right;   I & D EXTREMITY Right 09/28/2018   Procedure: DEBRIDEMENT RIGHT LOWER LEG WITH PLACEMENT WITH PLACEMENT OF SKIN GRAFT AND VAC;  Surgeon: [name], MD;  Location: [facility];  Service: Orthopedics;  Laterality: Right;   I & D EXTREMITY Right 10/03/2018   Procedure: REPEAT IRRIGATION AND DEBRIDEMENT RIGHT LOWER LEG, VAC PLACEMENT;  Surgeon: [name], MD;  Location: [facility];  Service: Orthopedics;  Laterality: Right;   I & D EXTREMITY Right 11/10/2021   Procedure: RIGHT LEG DEBRIDEMENT AND APPLY SKIN GRAFT;  Surgeon: [name], MD;  Location: [facility];  Service: Orthopedics;  Laterality: Right;   INCISE AND DRAIN ABCESS Right 07/14/2016   INCISION AND DRAINAGE Right 09/10/2008   leg:  skin and soft tissue and muscle/notes 09/15/2010   INCISION AND DRAINAGE  Right 01/01/2008   Chronic venous stasis insufficiency ulcer,/notes 09/14/2010/   INCISION AND DRAINAGE Right 08/6284   calf w/application wound vac/notes 07/20/2010   INCISION AND DRAINAGE Right 07/25/2016   IRRIGATION AND DEBRIDEMENT RIGHT LEG ULCER,   LAPAROSCOPIC GASTRIC BYPASS  ~ 2007   MULTIPLE TOOTH EXTRACTIONS     SKIN GRAFT SPLIT THICKNESS LEG / FOOT Right 07/25/2016   LEG   SKIN SPLIT GRAFT Right 07/27/2016   Procedure: SKIN GRAFT RIGHT LEG WITH THERASKIN APPLICATION;  Surgeon: [name], MD;  Location: [facility];  Service: Orthopedics;  Laterality: Right;   TONSILLECTOMY     Social History   Occupational History   Occupation: unemployed  Tobacco Use   Smoking status: Never   Smokeless tobacco: Never  Vaping Use   Vaping Use: Never used  Substance and Sexual Activity   Alcohol use: No   Drug use: No   Sexual activity: Yes    Partners: Male    Birth control/protection: I.U.D.

## 2022-04-06 DIAGNOSIS — I120 Hypertensive chronic kidney disease with stage 5 chronic kidney disease or end stage renal disease: Secondary | ICD-10-CM | POA: Diagnosis not present

## 2022-04-06 DIAGNOSIS — N185 Chronic kidney disease, stage 5: Secondary | ICD-10-CM | POA: Diagnosis not present

## 2022-04-06 DIAGNOSIS — D631 Anemia in chronic kidney disease: Secondary | ICD-10-CM | POA: Diagnosis not present

## 2022-04-06 DIAGNOSIS — N2581 Secondary hyperparathyroidism of renal origin: Secondary | ICD-10-CM | POA: Diagnosis not present

## 2022-05-02 DIAGNOSIS — N185 Chronic kidney disease, stage 5: Secondary | ICD-10-CM | POA: Diagnosis not present

## 2022-05-11 DIAGNOSIS — N2581 Secondary hyperparathyroidism of renal origin: Secondary | ICD-10-CM | POA: Diagnosis not present

## 2022-05-11 DIAGNOSIS — N184 Chronic kidney disease, stage 4 (severe): Secondary | ICD-10-CM | POA: Diagnosis not present

## 2022-05-11 DIAGNOSIS — I129 Hypertensive chronic kidney disease with stage 1 through stage 4 chronic kidney disease, or unspecified chronic kidney disease: Secondary | ICD-10-CM | POA: Diagnosis not present

## 2022-05-11 DIAGNOSIS — D631 Anemia in chronic kidney disease: Secondary | ICD-10-CM | POA: Diagnosis not present

## 2022-05-28 ENCOUNTER — Other Ambulatory Visit: Payer: Self-pay

## 2022-05-28 ENCOUNTER — Encounter (HOSPITAL_COMMUNITY): Payer: Self-pay | Admitting: Emergency Medicine

## 2022-05-28 ENCOUNTER — Encounter: Payer: Self-pay | Admitting: Oncology

## 2022-05-28 ENCOUNTER — Ambulatory Visit (INDEPENDENT_AMBULATORY_CARE_PROVIDER_SITE_OTHER): Payer: 59

## 2022-05-28 ENCOUNTER — Ambulatory Visit (HOSPITAL_COMMUNITY)
Admission: EM | Admit: 2022-05-28 | Discharge: 2022-05-28 | Disposition: A | Discharge status: Home / Self Care | Payer: 59 | Attending: Physician Assistant | Admitting: Physician Assistant

## 2022-05-28 DIAGNOSIS — J329 Chronic sinusitis, unspecified: Secondary | ICD-10-CM

## 2022-05-28 DIAGNOSIS — R059 Cough, unspecified: Secondary | ICD-10-CM | POA: Diagnosis not present

## 2022-05-28 DIAGNOSIS — J441 Chronic obstructive pulmonary disease with (acute) exacerbation: Secondary | ICD-10-CM

## 2022-05-28 MED ORDER — PREDNISONE 20 MG PO TABS: 40.0000 mg | ORAL_TABLET | Freq: Every day | ORAL | 0 refills | Status: AC

## 2022-05-28 MED ORDER — DOXYCYCLINE HYCLATE 100 MG PO TABS: 100.0000 mg | ORAL_TABLET | Freq: Two times a day (BID) | ORAL | 0 refills | Status: DC

## 2022-05-28 MED ORDER — ALBUTEROL SULFATE HFA 108 (90 BASE) MCG/ACT IN AERS: 2.0000 | INHALATION_SPRAY | Freq: Four times a day (QID) | RESPIRATORY_TRACT | 0 refills | Status: AC | PRN

## 2022-05-28 NOTE — ED Triage Notes (Addendum)
Symptoms started 05/20/2022.  Cough, back pain, sore throat, sinus drainage, cough hurts chest, headache.    Patient has a pcp.  Office wanted to see her yesterday, but because of rainy weather, she refused to go to pcp office.    Patient has had tylenol, nyquill, sinus medications.    Home covid test was positive, one test negative.  Patient did home testing yesterday

## 2022-05-28 NOTE — Discharge Instructions (Addendum)
Your x-ray did not show any evidence of infection such as pneumonia which is great news.  We are starting you on antibiotic to cover for sinuses/bronchitis.  Start doxycycline 100 mg twice daily for 10 days.  Stay out of the sun while on this medication.  Take prednisone 40 mg for 3 days.  Do not take NSAIDs with this medication including aspirin, ibuprofen/Advil, naproxen/Aleve.  Use albuterol every 4-6 hours as needed for shortness of breath and coughing fits.  You can use over-the-counter medication including Tylenol, Mucinex, Flonase for symptom relief.  Make sure you rest and drink plenty of fluid.  If your symptoms do not improving within 3 to 5 days please return for reevaluation.  If anything worsens and you have worsening cough, shortness of breath, nausea/vomiting, weakness, chest pain you should be seen immediately.

## 2022-05-28 NOTE — ED Provider Notes (Addendum)
[facility]    CSN: [id number] Arrival date & time: 05/28/22  1606      History   Chief Complaint Chief Complaint  Patient presents with   Cough    HPI Alice [name] is a 52 y.o. female.   Patient presents today with an 8 to 9-day history of URI symptoms including cough, body aches, sore throat, sinus drainage, headache.  She denies any fever, nausea, vomiting.  She did take an at-home COVID test earlier that was positive but took another 1 today that was negative.  She has had COVID vaccines.  She has not had influenza vaccine.  Reports approximately 5 years ago she had influenza that led to pneumonia requiring hospitalization and current symptoms are similar to previous episodes of this condition.  She does have a history of chronic bronchitis but does not use maintenance medication.  Denies history of smoking.  Denies any recent antibiotics or steroids.  Denies history of diabetes.  She is having difficulty with daily activities including sleeping through the night as a result of cough and symptoms.    Past Medical History:  Diagnosis Date   Anemia    Anxiety    Arthritis    Chest pain    Chronic bronchitis (HCC)    Chronic upper back pain    Depression    Depression    DVT (deep venous thrombosis) (HCC)    BLE   GERD (gastroesophageal reflux disease)    Headache    "weekly" (04/16/2015)   Hyperlipidemia    Hypertension    Joint pain    Migraine    "monthly" (04/16/2015)   Morbid obesity with BMI of 50.0-59.9, adult ([place])    Obesity    Other fatigue    Pneumonia 2014   Pulmonary embolism (HCC)    Sinusitis nasal    Sleep apnea    "I'm suppose to wear a mask but I don't" (04/16/2015)   SOB (shortness of breath) on exertion    Varicose veins of right lower extremity    Venous stasis of lower extremity    right   Wears dentures    Wears glasses     Patient Active Problem List   Diagnosis Date Noted   H/O skin graft 11/10/2021   Abscess of  right lower leg    SOB (shortness of breath) on exertion 09/15/2020   Vitamin D deficiency 09/15/2020   Hyperglycemia 09/15/2020   Subtherapeutic international normalized ratio (INR)    Postoperative pain    Hypoalbuminemia    Acute on chronic kidney failure ([place])    Acute blood loss anemia    Benign essential HTN    Other fatigue 10/05/2018   Chronic ulcer of right leg, with fat layer exposed ([place]) 09/28/2018   Chronic ulcer of leg, right, with necrosis of muscle ([place])    Iron deficiency anemia 01/29/2018   Pulmonary embolus ([place]) 01/09/2018   Encounter for therapeutic drug monitoring 01/09/2018   Severe malnutrition ([place]) 08/24/2017   Acute kidney injury superimposed on CKD ([place]) 12/28/2016   Hx of skin graft 08/01/2016   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 ([place]) 07/25/2016   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 ([place]) 07/05/2016   Trichomonal infection 11/24/2015   Abdominal pain 05/02/2015   Symptomatic cholelithiasis 04/16/2015   Acute bilateral upper abdominal pain 04/16/2015   Microcytic anemia 05/18/2013   Hypotension, unspecified 05/17/2013   Anemia due to stage 3b  chronic kidney disease ([place]) 05/17/2013   Hypokalemia 05/17/2013   Anal fissure 02/06/2013   Anal skin tag 02/06/2013   ALLERGIC RHINITIS 03/05/2010   LOW BACK PAIN SYNDROME 12/13/2007   OBESITY, MORBID 12/02/2006   HTN (hypertension) 12/02/2006   History of pulmonary embolism 12/02/2006   History of DVT (deep vein thrombosis) 12/02/2006   SYNDROME, POSTPHLEBITIC W/ULCER & INFLM 12/02/2006   GASTROESOPHAGEAL REFLUX DISEASE 12/02/2006   OSA (obstructive sleep apnea) 12/02/2006    Past Surgical History:  Procedure Laterality Date   APPLICATION OF WOUND VAC  11/10/2021   Procedure: APPLICATION OF WOUND VAC;  Surgeon: [name], MD;  Location: [facility];  Service: Orthopedics;;   CHOLECYSTECTOMY N/A 04/18/2015   Procedure:  LAPAROSCOPIC CHOLECYSTECTOMY;  Surgeon: Ralene Ok, MD;  Location: [facility];  Service: General;  Laterality: N/A;   HYSTEROSCOPY WITH D & C  12/24/2001   Archie Endo 09/28/2010   I & D EXTREMITY Right 07/25/2016   Procedure: IRRIGATION AND DEBRIDEMENT RIGHT LEG ULCER, APPLY VERAFLO [place];  Surgeon: [name], MD;  Location: [facility];  Service: Orthopedics;  Laterality: Right;   I & D EXTREMITY Right 09/28/2018   Procedure: DEBRIDEMENT RIGHT LOWER LEG WITH PLACEMENT WITH PLACEMENT OF SKIN GRAFT AND VAC;  Surgeon: [name], MD;  Location: [facility];  Service: Orthopedics;  Laterality: Right;   I & D EXTREMITY Right 10/03/2018   Procedure: REPEAT IRRIGATION AND DEBRIDEMENT RIGHT LOWER LEG, VAC PLACEMENT;  Surgeon: [name], MD;  Location: [facility];  Service: Orthopedics;  Laterality: Right;   I & D EXTREMITY Right 11/10/2021   Procedure: RIGHT LEG DEBRIDEMENT AND APPLY SKIN GRAFT;  Surgeon: [name], MD;  Location: [facility];  Service: Orthopedics;  Laterality: Right;   INCISE AND DRAIN ABCESS Right 07/14/2016   INCISION AND DRAINAGE Right 09/10/2008   leg:  skin and soft tissue and muscle/notes 09/15/2010   INCISION AND DRAINAGE Right 01/01/2008   Chronic venous stasis insufficiency ulcer,/notes 09/14/2010/   INCISION AND DRAINAGE Right 10/107   calf w/application wound vac/notes 07/20/2010   INCISION AND DRAINAGE Right 07/25/2016   IRRIGATION AND DEBRIDEMENT RIGHT LEG ULCER,   LAPAROSCOPIC GASTRIC BYPASS  ~ 2007   MULTIPLE TOOTH EXTRACTIONS     SKIN GRAFT SPLIT THICKNESS LEG / FOOT Right 07/25/2016   LEG   SKIN SPLIT GRAFT Right 07/27/2016   Procedure: SKIN GRAFT RIGHT LEG WITH THERASKIN APPLICATION;  Surgeon: [name], MD;  Location: [facility];  Service: Orthopedics;  Laterality: Right;   TONSILLECTOMY      OB History     Gravida  0   Para  0   Term  0   Preterm  0   AB  0   Living  0      SAB  0   IAB  0   Ectopic  0   Multiple  0   Live Births               Home  Medications    Prior to Admission medications   Medication Sig Start Date End Date Taking? Authorizing Provider  predniSONE (DELTASONE) 20 MG tablet Take 2 tablets (40 mg total) by mouth daily for 3 days. 05/28/22 05/31/22 Yes Magdalene Tardiff K, PA-C  albuterol (VENTOLIN HFA) 108 (90 Base) MCG/ACT inhaler Inhale 2 puffs into the lungs every 6 (six) hours as needed for wheezing or shortness of breath. 05/28/22   Rhythm Wigfall, Derry Skill, PA-C  amLODipine (NORVASC)  10 MG tablet Take 1 tablet (10 mg total) by mouth daily. 05/20/13   [name], MD  doxycycline (VIBRA-TABS) 100 MG tablet Take 1 tablet (100 mg total) by mouth 2 (two) times daily. 05/28/22   Cory Kitt K, PA-C  ELIQUIS 2.5 MG TABS tablet TAKE 1 TABLET(2.5 MG) BY MOUTH TWICE DAILY. FOLLOW-UP AS NEEDED FOR MORE REFILLS 08/24/20   Dorothy Spark, MD  furosemide (LASIX) 40 MG tablet Take 40 mg by mouth 2 (two) times daily. 01/10/22   [provider]  gabapentin (NEURONTIN) 400 MG capsule Take 400 mg by mouth 3 (three) times daily. 10/19/21   [provider]  HYDROcodone-acetaminophen (NORCO/VICODIN) 5-325 MG tablet Take 1 tablet by mouth every 4 (four) hours as needed. Patient not taking: Reported on 02/07/2022 11/11/21   [name], MD  nitroGLYCERIN (NITROSTAT) 0.4 MG SL tablet Place 1 tablet (0.4 mg total) under the tongue every 5 (five) minutes x 3 doses as needed for chest pain. Patient not taking: Reported on 02/07/2022 01/03/17   Rai, Vernelle Emerald, MD  OZEMPIC, 0.25 OR 0.5 MG/DOSE, 2 MG/3ML SOPN Inject 0.5 mg into the skin every Friday. 10/15/21   [provider]  tiZANidine (ZANAFLEX) 4 MG tablet Take 4-8 mg by mouth every 8 (eight) hours as needed for muscle spasms.    [provider]    Family History Family History  Problem Relation Age of Onset   Kidney disease Mother        kidney transplant   Diabetes Mother    Hyperlipidemia Mother    Hypertension Mother    Stroke Mother    Diabetes Father     Hypertension Father    Hyperlipidemia Father    Stroke Father    Kidney disease Father    Schizophrenia Father    Alcoholism Father     Social History Social History   Tobacco Use   Smoking status: Never   Smokeless tobacco: Never  Vaping Use   Vaping Use: Never used  Substance Use Topics   Alcohol use: No   Drug use: No     Allergies   Vicodin [hydrocodone-acetaminophen]   Review of Systems Review of Systems  Constitutional:  Positive for activity change and fatigue. Negative for appetite change and fever.  HENT:  Positive for congestion and sore throat. Negative for sinus pressure and sneezing.   Respiratory:  Positive for cough and chest tightness. Negative for shortness of breath.   Cardiovascular:  Negative for chest pain.  Gastrointestinal:  Negative for abdominal pain, diarrhea, nausea and vomiting.  Musculoskeletal:  Positive for arthralgias and myalgias.  Neurological:  Positive for headaches. Negative for dizziness and light-headedness.     Physical Exam Triage Vital Signs ED Triage Vitals  Enc Vitals Group     BP 05/28/22 1640 119/84     Pulse Rate 05/28/22 1640 82     Resp 05/28/22 1640 (!) 24     Temp 05/28/22 1640 98.3 F (36.8 C)     Temp Source 05/28/22 1640 Oral     SpO2 05/28/22 1640 95 %     Weight --      Height --      Head Circumference --      Peak Flow --      Pain Score 05/28/22 1637 10     Pain Loc --      Pain Edu? --      Excl. in [place]? --    No data  found.  Updated Vital Signs BP 119/84   Pulse 82   Temp 98.3 F (36.8 C) (Oral)   Resp (!) 24   SpO2 95%   Visual Acuity Right Eye Distance:   Left Eye Distance:   Bilateral Distance:    Right Eye Near:   Left Eye Near:    Bilateral Near:     Physical Exam Vitals reviewed.  Constitutional:      General: She is awake. She is not in acute distress.    Appearance: Normal appearance. She is well-developed. She is not ill-appearing.     Comments: Very pleasant female  appears stated age in no acute distress sitting comfortably on exam room table in no acute distress  HENT:     Head: Normocephalic and atraumatic.     Right Ear: Tympanic membrane, ear canal and external ear normal. Tympanic membrane is not erythematous or bulging.     Left Ear: Tympanic membrane, ear canal and external ear normal. Tympanic membrane is not erythematous or bulging.     Nose:     Right Sinus: No maxillary sinus tenderness or frontal sinus tenderness.     Left Sinus: No maxillary sinus tenderness or frontal sinus tenderness.     Mouth/Throat:     Pharynx: Uvula midline. Posterior oropharyngeal erythema present. No oropharyngeal exudate.  Cardiovascular:     Rate and Rhythm: Normal rate and regular rhythm.     Heart sounds: Normal heart sounds, S1 normal and S2 normal. No murmur heard. Pulmonary:     Effort: Pulmonary effort is normal.     Breath sounds: Wheezing present. No rhonchi or rales.     Comments: Scattered wheezing Psychiatric:        Behavior: Behavior is cooperative.      UC Treatments / Results  Labs (all labs ordered are listed, but only abnormal results are displayed) Labs Reviewed - No data to display  EKG   Radiology DG Chest 2 View  Result Date: 05/28/2022 CLINICAL DATA:  Cough EXAM: CHEST - 2 VIEW COMPARISON:  02/08/2022 FINDINGS: The heart size and mediastinal contours are within normal limits. No focal airspace consolidation, pleural effusion, or pneumothorax. The visualized skeletal structures are unremarkable. IMPRESSION: No active cardiopulmonary disease. Electronically Signed   By: Davina Poke D.O.   On: 05/28/2022 17:49    Procedures Procedures (including critical care time)  Medications Ordered in UC Medications - No data to display  Initial Impression / Assessment and Plan / UC Course  I have reviewed the triage vital signs and the nursing notes.  Pertinent labs & imaging results that were available during my care of the  patient were reviewed by me and considered in my medical decision making (see chart for details).     Patient is well-appearing, afebrile, nontoxic, nontachycardic, with oxygen saturation at 95% which is her baseline.  I do suspect her symptoms started as COVID since she had a positive at-home test but do not feel it is worthwhile to retest her today given it has been over a week since her symptoms began.  Chest x-ray was obtained and showed no acute cardiopulmonary disease.  Given prolonged and worsening symptoms concern for secondary bacterial infection.  She was started on doxycycline 100 mg twice daily for 10 days with instruction to avoid prolonged sun exposure due to photosensitivity associated with this medication.  She was started on prednisone burst of 40 mg for 3 days.  Discussed that she is not to take NSAIDs with  this medicine due to risk of GI bleeding.  She can use Tylenol, Mucinex, Flonase for additional symptom relief.  Prescription for albuterol was sent to pharmacy to ensure she has enough of this so she continue to have wheezing symptoms.  Discussed that she can use this every 4-6 hours as needed.  If her symptoms are not improving within 3 to 5 days she is to return for reevaluation.  Recommend close follow-up with her primary care.  Discussed that if she has any worsening symptoms including worsening cough, shortness of breath, nausea/vomiting, weakness, chest pain she needs to be seen immediately.  Strict return precautions given.  Work excuse note provided.  Final Clinical Impressions(s) / UC Diagnoses   Final diagnoses:  Sinobronchitis  Bronchitis, chronic obstructive, with exacerbation [facility])     Discharge Instructions      Your x-ray did not show any evidence of infection such as pneumonia which is great news.  We are starting you on antibiotic to cover for sinuses/bronchitis.  Start doxycycline 100 mg twice daily for 10 days.  Stay out of the sun while on this medication.   Take prednisone 40 mg for 3 days.  Do not take NSAIDs with this medication including aspirin, ibuprofen/Advil, naproxen/Aleve.  Use albuterol every 4-6 hours as needed for shortness of breath and coughing fits.  You can use over-the-counter medication including Tylenol, Mucinex, Flonase for symptom relief.  Make sure you rest and drink plenty of fluid.  If your symptoms do not improving within 3 to 5 days please return for reevaluation.  If anything worsens and you have worsening cough, shortness of breath, nausea/vomiting, weakness, chest pain you should be seen immediately.     ED Prescriptions     Medication Sig Dispense Auth. Provider   albuterol (VENTOLIN HFA) 108 (90 Base) MCG/ACT inhaler Inhale 2 puffs into the lungs every 6 (six) hours as needed for wheezing or shortness of breath. 18 g Parmvir Boomer K, PA-C   doxycycline (VIBRA-TABS) 100 MG tablet Take 1 tablet (100 mg total) by mouth 2 (two) times daily. 20 tablet Melody Cirrincione K, PA-C   predniSONE (DELTASONE) 20 MG tablet Take 2 tablets (40 mg total) by mouth daily for 3 days. 6 tablet Merdith Adan, Derry Skill, PA-C      PDMP not reviewed this encounter.   Terrilee Croak, PA-C 05/28/22 1801    Terrilee Croak, PA-C 05/28/22 1805

## 2022-06-21 DIAGNOSIS — Z7901 Long term (current) use of anticoagulants: Secondary | ICD-10-CM | POA: Diagnosis not present

## 2022-06-21 DIAGNOSIS — G4733 Obstructive sleep apnea (adult) (pediatric): Secondary | ICD-10-CM | POA: Diagnosis not present

## 2022-06-21 DIAGNOSIS — M5442 Lumbago with sciatica, left side: Secondary | ICD-10-CM | POA: Diagnosis not present

## 2022-06-21 DIAGNOSIS — R7303 Prediabetes: Secondary | ICD-10-CM | POA: Diagnosis not present

## 2022-06-21 DIAGNOSIS — N1831 Chronic kidney disease, stage 3a: Secondary | ICD-10-CM | POA: Diagnosis not present

## 2022-06-21 DIAGNOSIS — Z0000 Encounter for general adult medical examination without abnormal findings: Secondary | ICD-10-CM | POA: Diagnosis not present

## 2022-06-21 DIAGNOSIS — R1013 Epigastric pain: Secondary | ICD-10-CM | POA: Diagnosis not present

## 2022-06-21 DIAGNOSIS — I10 Essential (primary) hypertension: Secondary | ICD-10-CM | POA: Diagnosis not present

## 2022-06-21 DIAGNOSIS — R5383 Other fatigue: Secondary | ICD-10-CM | POA: Diagnosis not present

## 2022-06-21 DIAGNOSIS — G629 Polyneuropathy, unspecified: Secondary | ICD-10-CM | POA: Diagnosis not present

## 2022-06-21 DIAGNOSIS — G8929 Other chronic pain: Secondary | ICD-10-CM | POA: Diagnosis not present

## 2022-06-23 DIAGNOSIS — N184 Chronic kidney disease, stage 4 (severe): Secondary | ICD-10-CM | POA: Diagnosis not present

## 2022-06-29 DIAGNOSIS — N2581 Secondary hyperparathyroidism of renal origin: Secondary | ICD-10-CM | POA: Diagnosis not present

## 2022-06-29 DIAGNOSIS — N184 Chronic kidney disease, stage 4 (severe): Secondary | ICD-10-CM | POA: Diagnosis not present

## 2022-06-29 DIAGNOSIS — I129 Hypertensive chronic kidney disease with stage 1 through stage 4 chronic kidney disease, or unspecified chronic kidney disease: Secondary | ICD-10-CM | POA: Diagnosis not present

## 2022-06-29 DIAGNOSIS — D509 Iron deficiency anemia, unspecified: Secondary | ICD-10-CM | POA: Diagnosis not present

## 2022-07-22 ENCOUNTER — Other Ambulatory Visit: Payer: Self-pay | Admitting: Internal Medicine

## 2022-07-22 DIAGNOSIS — Z1231 Encounter for screening mammogram for malignant neoplasm of breast: Secondary | ICD-10-CM

## 2022-08-08 DIAGNOSIS — N184 Chronic kidney disease, stage 4 (severe): Secondary | ICD-10-CM | POA: Diagnosis not present

## 2022-08-11 DIAGNOSIS — Z7901 Long term (current) use of anticoagulants: Secondary | ICD-10-CM | POA: Diagnosis not present

## 2022-08-11 DIAGNOSIS — R7303 Prediabetes: Secondary | ICD-10-CM | POA: Diagnosis not present

## 2022-08-11 DIAGNOSIS — G8929 Other chronic pain: Secondary | ICD-10-CM | POA: Diagnosis not present

## 2022-08-11 DIAGNOSIS — E782 Mixed hyperlipidemia: Secondary | ICD-10-CM | POA: Diagnosis not present

## 2022-08-11 DIAGNOSIS — I10 Essential (primary) hypertension: Secondary | ICD-10-CM | POA: Diagnosis not present

## 2022-08-11 DIAGNOSIS — N1831 Chronic kidney disease, stage 3a: Secondary | ICD-10-CM | POA: Diagnosis not present

## 2022-08-11 DIAGNOSIS — G629 Polyneuropathy, unspecified: Secondary | ICD-10-CM | POA: Diagnosis not present

## 2022-08-11 DIAGNOSIS — M5442 Lumbago with sciatica, left side: Secondary | ICD-10-CM | POA: Diagnosis not present

## 2022-08-11 DIAGNOSIS — G4733 Obstructive sleep apnea (adult) (pediatric): Secondary | ICD-10-CM | POA: Diagnosis not present

## 2022-08-11 DIAGNOSIS — R1013 Epigastric pain: Secondary | ICD-10-CM | POA: Diagnosis not present

## 2022-08-17 DIAGNOSIS — N184 Chronic kidney disease, stage 4 (severe): Secondary | ICD-10-CM | POA: Diagnosis not present

## 2022-08-17 DIAGNOSIS — I129 Hypertensive chronic kidney disease with stage 1 through stage 4 chronic kidney disease, or unspecified chronic kidney disease: Secondary | ICD-10-CM | POA: Diagnosis not present

## 2022-08-17 DIAGNOSIS — D631 Anemia in chronic kidney disease: Secondary | ICD-10-CM | POA: Diagnosis not present

## 2022-08-17 DIAGNOSIS — N2581 Secondary hyperparathyroidism of renal origin: Secondary | ICD-10-CM | POA: Diagnosis not present

## 2022-09-06 DIAGNOSIS — N184 Chronic kidney disease, stage 4 (severe): Secondary | ICD-10-CM | POA: Diagnosis not present

## 2022-09-08 ENCOUNTER — Ambulatory Visit
Admission: RE | Admit: 2022-09-08 | Discharge: 2022-09-08 | Disposition: A | Discharge status: Home / Self Care | Payer: 59 | Source: Ambulatory Visit | Attending: Internal Medicine | Admitting: Internal Medicine

## 2022-09-08 DIAGNOSIS — Z1231 Encounter for screening mammogram for malignant neoplasm of breast: Secondary | ICD-10-CM | POA: Diagnosis not present

## 2022-09-14 DIAGNOSIS — D631 Anemia in chronic kidney disease: Secondary | ICD-10-CM | POA: Diagnosis not present

## 2022-09-14 DIAGNOSIS — N184 Chronic kidney disease, stage 4 (severe): Secondary | ICD-10-CM | POA: Diagnosis not present

## 2022-09-14 DIAGNOSIS — I129 Hypertensive chronic kidney disease with stage 1 through stage 4 chronic kidney disease, or unspecified chronic kidney disease: Secondary | ICD-10-CM | POA: Diagnosis not present

## 2022-09-14 DIAGNOSIS — N2581 Secondary hyperparathyroidism of renal origin: Secondary | ICD-10-CM | POA: Diagnosis not present

## 2022-10-13 DIAGNOSIS — G4733 Obstructive sleep apnea (adult) (pediatric): Secondary | ICD-10-CM | POA: Diagnosis not present

## 2022-10-13 DIAGNOSIS — Z7901 Long term (current) use of anticoagulants: Secondary | ICD-10-CM | POA: Diagnosis not present

## 2022-10-13 DIAGNOSIS — G629 Polyneuropathy, unspecified: Secondary | ICD-10-CM | POA: Diagnosis not present

## 2022-10-13 DIAGNOSIS — G8929 Other chronic pain: Secondary | ICD-10-CM | POA: Diagnosis not present

## 2022-10-13 DIAGNOSIS — N1831 Chronic kidney disease, stage 3a: Secondary | ICD-10-CM | POA: Diagnosis not present

## 2022-10-13 DIAGNOSIS — N184 Chronic kidney disease, stage 4 (severe): Secondary | ICD-10-CM | POA: Diagnosis not present

## 2022-10-13 DIAGNOSIS — E782 Mixed hyperlipidemia: Secondary | ICD-10-CM | POA: Diagnosis not present

## 2022-10-13 DIAGNOSIS — R7303 Prediabetes: Secondary | ICD-10-CM | POA: Diagnosis not present

## 2022-10-13 DIAGNOSIS — R1013 Epigastric pain: Secondary | ICD-10-CM | POA: Diagnosis not present

## 2022-10-13 DIAGNOSIS — M5442 Lumbago with sciatica, left side: Secondary | ICD-10-CM | POA: Diagnosis not present

## 2022-10-13 DIAGNOSIS — I10 Essential (primary) hypertension: Secondary | ICD-10-CM | POA: Diagnosis not present

## 2022-10-18 DIAGNOSIS — N184 Chronic kidney disease, stage 4 (severe): Secondary | ICD-10-CM | POA: Diagnosis not present

## 2022-10-18 DIAGNOSIS — I129 Hypertensive chronic kidney disease with stage 1 through stage 4 chronic kidney disease, or unspecified chronic kidney disease: Secondary | ICD-10-CM | POA: Diagnosis not present

## 2022-10-18 DIAGNOSIS — D631 Anemia in chronic kidney disease: Secondary | ICD-10-CM | POA: Diagnosis not present

## 2022-10-18 DIAGNOSIS — D509 Iron deficiency anemia, unspecified: Secondary | ICD-10-CM | POA: Diagnosis not present

## 2022-10-18 DIAGNOSIS — N2581 Secondary hyperparathyroidism of renal origin: Secondary | ICD-10-CM | POA: Diagnosis not present

## 2022-10-31 ENCOUNTER — Ambulatory Visit (INDEPENDENT_AMBULATORY_CARE_PROVIDER_SITE_OTHER): Payer: 59 | Admitting: Orthopedic Surgery

## 2022-10-31 DIAGNOSIS — I87331 Chronic venous hypertension (idiopathic) with ulcer and inflammation of right lower extremity: Secondary | ICD-10-CM

## 2022-11-01 ENCOUNTER — Encounter: Payer: Self-pay | Admitting: Orthopedic Surgery

## 2022-11-01 NOTE — Progress Notes (Signed)
Office Visit Note   Patient: Alice [name]           Date of Birth: 12-Sep-1970           MRN: [id number] Visit Date: 10/31/2022              Requested by: Jackie Plum, MD [address] [place],  [place] [address] PCP: Jackie Plum, MD  Chief Complaint  Patient presents with   Right Leg - Wound Check      HPI: Patient is a 52 year old woman who is seen in follow-up for her chronic venous ulcer right lower extremity.  Patient states she is now in kidney failure and will require dialysis.  Patient states that in order for her to be on the transplant list the ulcer will need to be healed.  Patient has lost a significant amount of weight since her last evaluation.  Assessment & Plan: Visit Diagnoses:  1. 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  Patient states she does not want to consider the compression wrap she will resume wearing her compression socks.  Recommended starting dialysis to facilitate the healing.  Patient states she does not want to consider dialysis at this time.  Patient may be a candidate for tissue graft.  Follow-Up Instructions: No follow-ups on file.   Ortho Exam  Patient is alert, oriented, no adenopathy, well-dressed, normal affect, normal respiratory effort. Patient reports her creatinine is 13.  Examination of the right leg the ulcer is 3 cm in diameter flat with healthy granulation tissue she has had significant healing of the large venous ulcer.  Imaging: No results found.   Labs: Lab Results  Component Value Date   HGBA1C 5.2 09/15/2020   REPTSTATUS 11/15/2021 FINAL 11/10/2021   GRAMSTAIN  11/10/2021    NO WBC SEEN RARE GRAM POSITIVE COCCI RARE GRAM POSITIVE RODS    CULT  11/10/2021    FEW METHICILLIN RESISTANT STAPHYLOCOCCUS AUREUS MODERATE CORYNEBACTERIUM STRIATUM Standardized  susceptibility testing for this organism is not available. NO ANAEROBES ISOLATED Performed at [facility], [address] [place], [place] [address]    LABORGA METHICILLIN RESISTANT STAPHYLOCOCCUS AUREUS 11/10/2021     Lab Results  Component Value Date   ALBUMIN 4.2 09/15/2020   ALBUMIN 3.4 (L) 05/12/2019   ALBUMIN 2.5 (L) 10/08/2018    Lab Results  Component Value Date   MG 1.9 08/19/2019   MG 1.8 12/29/2016   MG 1.9 12/28/2016   Lab Results  Component Value Date   VD25OH 12.0 (L) 09/15/2020    No results found for: "PREALBUMIN"    Latest Ref Rng & Units 02/08/2022    2:10 PM 02/08/2022    6:30 AM 11/10/2021    5:54 AM  CBC EXTENDED  WBC 4.0 - 10.5 K/uL  5.3  5.2   RBC 3.87 - 5.11 MIL/uL  3.98  3.77   Hemoglobin 12.0 - 15.0 g/dL 56.2  13.0  86.5   HCT 36.0 - 46.0 % 36.0  33.2  31.8   Platelets 150 - 400 K/uL  240  195      There is no height or weight on file to calculate BMI.  Orders:  No orders of the defined types were placed in this encounter.  No orders of the defined types were placed in this encounter.    Procedures: No procedures performed  Clinical Data: No additional findings.  ROS:  All other systems negative, except as noted in the HPI. Review of Systems  Objective: Vital Signs: There were no vitals taken for this visit.  Specialty Comments:  No specialty comments available.  PMFS History: Patient Active Problem List   Diagnosis Date Noted   H/O skin graft 11/10/2021   Abscess of right lower leg    SOB (shortness of breath) on exertion 09/15/2020   Vitamin D deficiency 09/15/2020   Hyperglycemia 09/15/2020   Subtherapeutic international normalized ratio (INR)    Postoperative pain    Hypoalbuminemia    Acute on chronic kidney failure (HCC)    Acute blood loss anemia    Benign essential HTN    Other fatigue 10/05/2018   Chronic ulcer of right leg, with fat layer exposed (HCC) 09/28/2018   Chronic ulcer of leg, right,  with necrosis of muscle (HCC)    Iron deficiency anemia 01/29/2018   Pulmonary embolus (HCC) 01/09/2018   Encounter for therapeutic drug monitoring 01/09/2018   Severe malnutrition (HCC) 08/24/2017   Acute kidney injury superimposed on CKD (HCC) 12/28/2016   Hx of skin graft 08/01/2016   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 (HCC) 07/25/2016   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 (HCC) 07/05/2016   Trichomonal infection 11/24/2015   Abdominal pain 05/02/2015   Symptomatic cholelithiasis 04/16/2015   Acute bilateral upper abdominal pain 04/16/2015   Microcytic anemia 05/18/2013   Hypotension, unspecified 05/17/2013   Anemia due to stage 3b chronic kidney disease (HCC) 05/17/2013   Hypokalemia 05/17/2013   Anal fissure 02/06/2013   Anal skin tag 02/06/2013   ALLERGIC RHINITIS 03/05/2010   LOW BACK PAIN SYNDROME 12/13/2007   OBESITY, MORBID 12/02/2006   HTN (hypertension) 12/02/2006   History of pulmonary embolism 12/02/2006   History of DVT (deep vein thrombosis) 12/02/2006   SYNDROME, POSTPHLEBITIC W/ULCER & INFLM 12/02/2006   GASTROESOPHAGEAL REFLUX DISEASE 12/02/2006   OSA (obstructive sleep apnea) 12/02/2006   Past Medical History:  Diagnosis Date   Anemia    Anxiety    Arthritis    Chest pain    Chronic bronchitis (HCC)    Chronic upper back pain    Depression    Depression    DVT (deep venous thrombosis) (HCC)    BLE   GERD (gastroesophageal reflux disease)    Headache    "weekly" (04/16/2015)   Hyperlipidemia    Hypertension    Joint pain    Migraine    "monthly" (04/16/2015)   Morbid obesity with BMI of 50.0-59.9, adult (HCC)    Obesity    Other fatigue    Pneumonia 2014   Pulmonary embolism (HCC)    Sinusitis nasal    Sleep apnea    "I'm suppose to wear a mask but I don't" (04/16/2015)   SOB (shortness of breath) on exertion    Varicose veins of right lower extremity    Venous  stasis of lower extremity    right   Wears dentures    Wears glasses     Family History  Problem Relation Age of Onset   Kidney disease Mother        kidney transplant   Diabetes Mother    Hyperlipidemia Mother    Hypertension Mother    Stroke Mother    Diabetes Father    Hypertension Father    Hyperlipidemia Father    Stroke Father  Kidney disease Father    Schizophrenia Father    Alcoholism Father     Past Surgical History:  Procedure Laterality Date   APPLICATION OF WOUND VAC  11/10/2021   Procedure: APPLICATION OF WOUND VAC;  Surgeon: Nadara Mustard, MD;  Location: [facility];  Service: Orthopedics;;   CHOLECYSTECTOMY N/A 04/18/2015   Procedure: LAPAROSCOPIC CHOLECYSTECTOMY;  Surgeon: Axel Filler, MD;  Location: [facility];  Service: General;  Laterality: N/A;   HYSTEROSCOPY WITH D & C  12/24/2001   Hattie Perch 09/28/2010   I & D EXTREMITY Right 07/25/2016   Procedure: IRRIGATION AND DEBRIDEMENT RIGHT LEG ULCER, APPLY VERAFLO VAC;  Surgeon: Nadara Mustard, MD;  Location[facility];  Service: Orthopedics;  Laterality: Right;   I & D EXTREMITY Right 09/28/2018   Procedure: DEBRIDEMENT RIGHT LOWER LEG WITH PLACEMENT WITH PLACEMENT OF SKIN GRAFT AND VAC;  Surgeon: Nadara Mustard, MD;  [facility];  Service: Orthopedics;  Laterality: Right;   I & D EXTREMITY Right 10/03/2018   Procedure: REPEAT IRRIGATION AND DEBRIDEMENT RIGHT LOWER LEG, VAC PLACEMENT;  Surgeon: Nadara Mustard, MD;  [facility];  Service: Orthopedics;  Laterality: Right;   I & D EXTREMITY Right 11/10/2021   Procedure: RIGHT LEG DEBRIDEMENT AND APPLY SKIN GRAFT;  Surgeon: Nadara Mustard, MD;  Location: [facility] OR;  Service: Orthopedics;  Laterality: Right;   INCISE AND DRAIN ABCESS Right 07/14/2016   INCISION AND DRAINAGE Right 09/10/2008   leg:  skin and soft tissue and muscle/notes 09/15/2010   INCISION AND DRAINAGE Right 01/01/2008   Chronic venous stasis insufficiency ulcer,/notes 09/14/2010/   INCISION AND DRAINAGE Right 07/2010    calf w/application wound vac/notes 07/20/2010   INCISION AND DRAINAGE Right 07/25/2016   IRRIGATION AND DEBRIDEMENT RIGHT LEG ULCER,   LAPAROSCOPIC GASTRIC BYPASS  ~ 2007   MULTIPLE TOOTH EXTRACTIONS     SKIN GRAFT SPLIT THICKNESS LEG / FOOT Right 07/25/2016   LEG   SKIN SPLIT GRAFT Right 07/27/2016   Procedure: SKIN GRAFT RIGHT LEG WITH THERASKIN APPLICATION;  Surgeon: Nadara Mustard, MD;  Location[facility];  Service: Orthopedics;  Laterality: Right;   TONSILLECTOMY     Social History   Occupational History   Occupation: unemployed  Tobacco Use   Smoking status: Never   Smokeless tobacco: Never  Vaping Use   Vaping Use: Never used  Substance and Sexual Activity   Alcohol use: No   Drug use: No   Sexual activity: Yes    Partners: Male    Birth control/protection: I.U.D.

## 2022-11-03 ENCOUNTER — Ambulatory Visit (INDEPENDENT_AMBULATORY_CARE_PROVIDER_SITE_OTHER): Payer: 59 | Admitting: Orthopedic Surgery

## 2022-11-03 DIAGNOSIS — I87331 Chronic venous hypertension (idiopathic) with ulcer and inflammation of right lower extremity: Secondary | ICD-10-CM

## 2022-11-07 ENCOUNTER — Ambulatory Visit: Payer: 59 | Admitting: Orthopedic Surgery

## 2022-11-09 ENCOUNTER — Other Ambulatory Visit: Payer: Self-pay

## 2022-11-10 ENCOUNTER — Ambulatory Visit (INDEPENDENT_AMBULATORY_CARE_PROVIDER_SITE_OTHER): Payer: 59 | Admitting: Orthopedic Surgery

## 2022-11-10 ENCOUNTER — Encounter: Payer: Self-pay | Admitting: Orthopedic Surgery

## 2022-11-10 DIAGNOSIS — I87331 Chronic venous hypertension (idiopathic) with ulcer and inflammation of right lower extremity: Secondary | ICD-10-CM | POA: Diagnosis not present

## 2022-11-10 NOTE — Progress Notes (Signed)
Office Visit Note   Patient: Alice [name]           Date of Birth: 1970-09-11           MRN: [id number] Visit Date: 11/10/2022              Requested by: Jackie Plum, MD [address] [place],  [place] [address] PCP: Jackie Plum, MD  Chief Complaint  Patient presents with   Right Leg - Wound Check      HPI: Patient is a 52 year old woman who presents follow-up for chronic venous insufficiency ulcer right lower extremity.  Assessment & Plan: Visit Diagnoses:  1. 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  She will start dry dressing changes and leave the Adaptic in place and continue with compression.  Follow-Up Instructions: Return in about 2 weeks (around 11/24/2022).   Ortho Exam  Patient is alert, oriented, no adenopathy, well-dressed, normal affect, normal respiratory effort.  Examination there is flat healthy granulation tissue the wound is debrided the wound measures 2 x 2 cm and is flat.   Imaging: No results found.   Labs: Lab Results  Component Value Date   HGBA1C 5.2 09/15/2020   REPTSTATUS 11/15/2021 FINAL 11/10/2021   GRAMSTAIN  11/10/2021    NO WBC SEEN RARE GRAM POSITIVE COCCI RARE GRAM POSITIVE RODS    CULT  11/10/2021    FEW METHICILLIN RESISTANT STAPHYLOCOCCUS AUREUS MODERATE CORYNEBACTERIUM STRIATUM Standardized susceptibility testing for this organism is not available. NO ANAEROBES ISOLATED Performed at [facility], [address], [place], [place] [address]    LABORGA METHICILLIN RESISTANT STAPHYLOCOCCUS AUREUS 11/10/2021     Lab Results  Component Value Date   ALBUMIN 4.2 09/15/2020   ALBUMIN 3.4 (L) 05/12/2019   ALBUMIN 2.5 (L) 10/08/2018    Lab Results  Component Value Date   MG 1.9 08/19/2019   MG 1.8 12/29/2016   MG 1.9 12/28/2016    Lab Results  Component Value Date   VD25OH 12.0 (L) 09/15/2020    No results found for: "PREALBUMIN"    Latest Ref Rng & Units 02/08/2022    2:10 PM 02/08/2022    6:30 AM 11/10/2021    5:54 AM  CBC EXTENDED  WBC 4.0 - 10.5 K/uL  5.3  5.2   RBC 3.87 - 5.11 MIL/uL  3.98  3.77   Hemoglobin 12.0 - 15.0 g/dL 24.4  01.0  27.2   HCT 36.0 - 46.0 % 36.0  33.2  31.8   Platelets 150 - 400 K/uL  240  195      There is no height or weight on file to calculate BMI.  Orders:  No orders of the defined types were placed in this encounter.  No orders of the defined types were placed in this encounter.    Procedures: No procedures performed  Clinical Data: No additional findings.  ROS:  All other systems negative, except as noted in the HPI. Review of Systems  Objective: Vital Signs: There were no vitals taken for this visit.  Specialty Comments:  No specialty comments available.  PMFS History: Patient Active Problem List   Diagnosis Date Noted   H/O skin graft 11/10/2021   Abscess of right lower leg    SOB (shortness of breath) on exertion 09/15/2020   Vitamin D deficiency 09/15/2020  Hyperglycemia 09/15/2020   Subtherapeutic international normalized ratio (INR)    Postoperative pain    Hypoalbuminemia    Acute on chronic kidney failure (HCC)    Acute blood loss anemia    Benign essential HTN    Other fatigue 10/05/2018   Chronic ulcer of right leg, with fat layer exposed (HCC) 09/28/2018   Chronic ulcer of leg, right, with necrosis of muscle (HCC)    Iron deficiency anemia 01/29/2018   Pulmonary embolus (HCC) 01/09/2018   Encounter for therapeutic drug monitoring 01/09/2018   Severe malnutrition (HCC) 08/24/2017   Acute kidney injury superimposed on CKD (HCC) 12/28/2016   Hx of skin graft 08/01/2016   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 (HCC) 07/25/2016   Idiopathic chronic venous hypertension of right lower extremity with ulcer and  inflammation (HCC) 07/05/2016   Trichomonal infection 11/24/2015   Abdominal pain 05/02/2015   Symptomatic cholelithiasis 04/16/2015   Acute bilateral upper abdominal pain 04/16/2015   Microcytic anemia 05/18/2013   Hypotension, unspecified 05/17/2013   Anemia due to stage 3b chronic kidney disease (HCC) 05/17/2013   Hypokalemia 05/17/2013   Anal fissure 02/06/2013   Anal skin tag 02/06/2013   ALLERGIC RHINITIS 03/05/2010   LOW BACK PAIN SYNDROME 12/13/2007   OBESITY, MORBID 12/02/2006   HTN (hypertension) 12/02/2006   History of pulmonary embolism 12/02/2006   History of DVT (deep vein thrombosis) 12/02/2006   SYNDROME, POSTPHLEBITIC W/ULCER & INFLM 12/02/2006   GASTROESOPHAGEAL REFLUX DISEASE 12/02/2006   OSA (obstructive sleep apnea) 12/02/2006   Past Medical History:  Diagnosis Date   Anemia    Anxiety    Arthritis    Chest pain    Chronic bronchitis (HCC)    Chronic upper back pain    Depression    Depression    DVT (deep venous thrombosis) (HCC)    BLE   GERD (gastroesophageal reflux disease)    Headache    "weekly" (04/16/2015)   Hyperlipidemia    Hypertension    Joint pain    Migraine    "monthly" (04/16/2015)   Morbid obesity with BMI of 50.0-59.9, adult (HCC)    Obesity    Other fatigue    Pneumonia 2014   Pulmonary embolism (HCC)    Sinusitis nasal    Sleep apnea    "I'm suppose to wear a mask but I don't" (04/16/2015)   SOB (shortness of breath) on exertion    Varicose veins of right lower extremity    Venous stasis of lower extremity    right   Wears dentures    Wears glasses     Family History  Problem Relation Age of Onset   Kidney disease Mother        kidney transplant   Diabetes Mother    Hyperlipidemia Mother    Hypertension Mother    Stroke Mother    Diabetes Father    Hypertension Father    Hyperlipidemia Father    Stroke Father    Kidney disease Father    Schizophrenia Father    Alcoholism Father     Past Surgical History:   Procedure Laterality Date   APPLICATION OF WOUND VAC  11/10/2021   Procedure: APPLICATION OF WOUND VAC;  Surgeon: Nadara Mustard, MD;  Location: [facility] OR;  Service: Orthopedics;;   CHOLECYSTECTOMY N/A 04/18/2015   Procedure: LAPAROSCOPIC CHOLECYSTECTOMY;  Surgeon: Axel Filler, MD;  Location: [facility] OR;  Service: General;  Laterality: N/A;   HYSTEROSCOPY WITH D & C  12/24/2001   Hattie Perch 09/28/2010   I & D EXTREMITY Right 07/25/2016   Procedure: IRRIGATION AND DEBRIDEMENT RIGHT LEG ULCER, APPLY VERAFLO VAC;  Surgeon: Nadara Mustard, MD;  Location: [facility] OR;  Service: Orthopedics;  Laterality: Right;   I & D EXTREMITY Right 09/28/2018   Procedure: DEBRIDEMENT RIGHT LOWER LEG WITH PLACEMENT WITH PLACEMENT OF SKIN GRAFT AND VAC;  Surgeon: Nadara Mustard, MD;  Location: [facility] OR;  Service: Orthopedics;  Laterality: Right;   I & D EXTREMITY Right 10/03/2018   Procedure: REPEAT IRRIGATION AND DEBRIDEMENT RIGHT LOWER LEG, VAC PLACEMENT;  Surgeon: Nadara Mustard, MD;  Location: [facility] OR;  Service: Orthopedics;  Laterality: Right;   I & D EXTREMITY Right 11/10/2021   Procedure: RIGHT LEG DEBRIDEMENT AND APPLY SKIN GRAFT;  Surgeon: Nadara Mustard, MD;  Location: [facility];  Service: Orthopedics;  Laterality: Right;   INCISE AND DRAIN ABCESS Right 07/14/2016   INCISION AND DRAINAGE Right 09/10/2008   leg:  skin and soft tissue and muscle/notes 09/15/2010   INCISION AND DRAINAGE Right 01/01/2008   Chronic venous stasis insufficiency ulcer,/notes 09/14/2010/   INCISION AND DRAINAGE Right 07/2010   calf w/application wound vac/notes 07/20/2010   INCISION AND DRAINAGE Right 07/25/2016   IRRIGATION AND DEBRIDEMENT RIGHT LEG ULCER,   LAPAROSCOPIC GASTRIC BYPASS  ~ 2007   MULTIPLE TOOTH EXTRACTIONS     SKIN GRAFT SPLIT THICKNESS LEG / FOOT Right 07/25/2016   LEG   SKIN SPLIT GRAFT Right 07/27/2016   Procedure: SKIN GRAFT RIGHT LEG WITH THERASKIN APPLICATION;  Surgeon: Nadara Mustard, MD;  Location: [facility] OR;  Service: Orthopedics;  Laterality:  Right;   TONSILLECTOMY     Social History   Occupational History   Occupation: unemployed  Tobacco Use   Smoking status: Never   Smokeless tobacco: Never  Vaping Use   Vaping Use: Never used  Substance and Sexual Activity   Alcohol use: No   Drug use: No   Sexual activity: Yes    Partners: Male    Birth control/protection: I.U.D.

## 2022-11-16 DIAGNOSIS — N184 Chronic kidney disease, stage 4 (severe): Secondary | ICD-10-CM | POA: Diagnosis not present

## 2022-11-21 ENCOUNTER — Encounter: Payer: Self-pay | Admitting: Orthopedic Surgery

## 2022-11-21 NOTE — Progress Notes (Signed)
Office Visit Note   Patient: Alice [name]           Date of Birth: [birth year]           MRN: [id number] Visit Date: 11/03/2022              Requested by: Jackie Plum, MD [address] [place],  [place] [address] PCP: Jackie Plum, MD  Chief Complaint  Patient presents with   Right Leg - Follow-up    In office application donation graft today       HPI: Patient is a 52 year old woman who presents in follow-up for chronic venous insufficiency ulceration right lower extremity.  Assessment & Plan: Visit Diagnoses:  1. 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  Follow-Up Instructions: Return in about 1 week (around 11/10/2022).   Ortho Exam  Patient is alert, oriented, no adenopathy, well-dressed, normal affect, normal respiratory effort. Examination the wound bed was debrided there is healthy granulation tissue the wound measures 2 x 4 cm after debridement.  Donated Kerecis tissue graft was applied covered with Adaptic 4 x 4's and an Ace wrap.  Imaging: No results found. No images are attached to the encounter.  Labs: Lab Results  Component Value Date   HGBA1C 5.2 09/15/2020   REPTSTATUS 11/15/2021 FINAL 11/10/2021   GRAMSTAIN  11/10/2021    NO WBC SEEN RARE GRAM POSITIVE COCCI RARE GRAM POSITIVE RODS    CULT  11/10/2021    FEW METHICILLIN RESISTANT STAPHYLOCOCCUS AUREUS MODERATE CORYNEBACTERIUM STRIATUM Standardized susceptibility testing for this organism is not available. NO ANAEROBES ISOLATED Performed at [facility], [address], [place], [place] [address]    LABORGA METHICILLIN RESISTANT STAPHYLOCOCCUS AUREUS 11/10/2021     Lab Results  Component Value Date   ALBUMIN 4.2 09/15/2020   ALBUMIN 3.4 (L) 05/12/2019   ALBUMIN 2.5 (L) 10/08/2018    Lab Results  Component Value Date   MG 1.9  08/19/2019   MG 1.8 12/29/2016   MG 1.9 12/28/2016   Lab Results  Component Value Date   VD25OH 12.0 (L) 09/15/2020    No results found for: "PREALBUMIN"    Latest Ref Rng & Units 02/08/2022    2:10 PM 02/08/2022    6:30 AM 11/10/2021    5:54 AM  CBC EXTENDED  WBC 4.0 - 10.5 K/uL  5.3  5.2   RBC 3.87 - 5.11 MIL/uL  3.98  3.77   Hemoglobin 12.0 - 15.0 g/dL 56.2  13.0  86.5   HCT 36.0 - 46.0 % 36.0  33.2  31.8   Platelets 150 - 400 K/uL  240  195      There is no height or weight on file to calculate BMI.  Orders:  No orders of the defined types were placed in this encounter.  No orders of the defined types were placed in this encounter.    Procedures: No procedures performed  Clinical Data: No additional findings.  ROS:  All other systems negative, except as noted in the HPI. Review of Systems  Objective: Vital Signs: There were no vitals taken for this visit.  Specialty Comments:  No specialty comments available.  PMFS History: Patient Active Problem List   Diagnosis Date Noted   H/O skin graft 11/10/2021   Abscess of right lower leg    SOB (shortness of breath) on exertion  09/15/2020   Vitamin D deficiency 09/15/2020   Hyperglycemia 09/15/2020   Subtherapeutic international normalized ratio (INR)    Postoperative pain    Hypoalbuminemia    Acute on chronic kidney failure (HCC)    Acute blood loss anemia    Benign essential HTN    Other fatigue 10/05/2018   Chronic ulcer of right leg, with fat layer exposed (HCC) 09/28/2018   Chronic ulcer of leg, right, with necrosis of muscle (HCC)    Iron deficiency anemia 01/29/2018   Pulmonary embolus (HCC) 01/09/2018   Encounter for therapeutic drug monitoring 01/09/2018   Severe malnutrition (HCC) 08/24/2017   Acute kidney injury superimposed on CKD (HCC) 12/28/2016   Hx of skin graft 08/01/2016   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 (HCC) 07/25/2016   Idiopathic chronic venous  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 (HCC) 07/05/2016   Trichomonal infection 11/24/2015   Abdominal pain 05/02/2015   Symptomatic cholelithiasis 04/16/2015   Acute bilateral upper abdominal pain 04/16/2015   Microcytic anemia 05/18/2013   Hypotension, unspecified 05/17/2013   Anemia due to stage 3b chronic kidney disease (HCC) 05/17/2013   Hypokalemia 05/17/2013   Anal fissure 02/06/2013   Anal skin tag 02/06/2013   ALLERGIC RHINITIS 03/05/2010   LOW BACK PAIN SYNDROME 12/13/2007   OBESITY, MORBID 12/02/2006   HTN (hypertension) 12/02/2006   History of pulmonary embolism 12/02/2006   History of DVT (deep vein thrombosis) 12/02/2006   SYNDROME, POSTPHLEBITIC W/ULCER & INFLM 12/02/2006   GASTROESOPHAGEAL REFLUX DISEASE 12/02/2006   OSA (obstructive sleep apnea) 12/02/2006   Past Medical History:  Diagnosis Date   Anemia    Anxiety    Arthritis    Chest pain    Chronic bronchitis (HCC)    Chronic upper back pain    Depression    Depression    DVT (deep venous thrombosis) (HCC)    BLE   GERD (gastroesophageal reflux disease)    Headache    "weekly" (04/16/2015)   Hyperlipidemia    Hypertension    Joint pain    Migraine    "monthly" (04/16/2015)   Morbid obesity with BMI of 50.0-59.9, adult (HCC)    Obesity    Other fatigue    Pneumonia 2014   Pulmonary embolism (HCC)    Sinusitis nasal    Sleep apnea    "I'm suppose to wear a mask but I don't" (04/16/2015)   SOB (shortness of breath) on exertion    Varicose veins of right lower extremity    Venous stasis of lower extremity    right   Wears dentures    Wears glasses     Family History  Problem Relation Age of Onset   Kidney disease Mother        kidney transplant   Diabetes Mother    Hyperlipidemia Mother    Hypertension Mother    Stroke Mother    Diabetes Father    Hypertension Father    Hyperlipidemia Father    Stroke Father    Kidney disease Father    Schizophrenia Father     Alcoholism Father     Past Surgical History:  Procedure Laterality Date   APPLICATION OF WOUND VAC  11/10/2021   Procedure: APPLICATION OF WOUND VAC;  Surgeon: Nadara Mustard, MD;  Location: [facility] OR;  Service: Orthopedics;;   CHOLECYSTECTOMY N/A 04/18/2015   Procedure: LAPAROSCOPIC CHOLECYSTECTOMY;  Surgeon: Axel Filler, MD;  Location: [facility] OR;  Service: General;  Laterality: N/A;   HYSTEROSCOPY WITH D & C  12/24/2001   Hattie Perch 09/28/2010   I & D EXTREMITY Right 07/25/2016   Procedure: IRRIGATION AND DEBRIDEMENT RIGHT LEG ULCER, APPLY VERAFLO VAC;  Surgeon: Nadara Mustard, MD;  Location: [facility] OR;  Service: Orthopedics;  Laterality: Right;   I & D EXTREMITY Right 09/28/2018   Procedure: DEBRIDEMENT RIGHT LOWER LEG WITH PLACEMENT WITH PLACEMENT OF SKIN GRAFT AND VAC;  Surgeon: Nadara Mustard, MD;  Location: [facility] OR;  Service: Orthopedics;  Laterality: Right;   I & D EXTREMITY Right 10/03/2018   Procedure: REPEAT IRRIGATION AND DEBRIDEMENT RIGHT LOWER LEG, VAC PLACEMENT;  Surgeon: Nadara Mustard, MD;  Location: [facility];  Service: Orthopedics;  Laterality: Right;   I & D EXTREMITY Right 11/10/2021   Procedure: RIGHT LEG DEBRIDEMENT AND APPLY SKIN GRAFT;  Surgeon: Nadara Mustard, MD;  Location: [facility];  Service: Orthopedics;  Laterality: Right;   INCISE AND DRAIN ABCESS Right 07/14/2016   INCISION AND DRAINAGE Right 09/10/2008   leg:  skin and soft tissue and muscle/notes 09/15/2010   INCISION AND DRAINAGE Right 01/01/2008   Chronic venous stasis insufficiency ulcer,/notes 09/14/2010/   INCISION AND DRAINAGE Right 07/2010   calf w/application wound vac/notes 07/20/2010   INCISION AND DRAINAGE Right 07/25/2016   IRRIGATION AND DEBRIDEMENT RIGHT LEG ULCER,   LAPAROSCOPIC GASTRIC BYPASS  ~ 2007   MULTIPLE TOOTH EXTRACTIONS     SKIN GRAFT SPLIT THICKNESS LEG / FOOT Right 07/25/2016   LEG   SKIN SPLIT GRAFT Right 07/27/2016   Procedure: SKIN GRAFT RIGHT LEG WITH THERASKIN APPLICATION;  Surgeon: Nadara Mustard, MD;   Location: [facility] OR;  Service: Orthopedics;  Laterality: Right;   TONSILLECTOMY     Social History   Occupational History   Occupation: unemployed  Tobacco Use   Smoking status: Never   Smokeless tobacco: Never  Vaping Use   Vaping Use: Never used  Substance and Sexual Activity   Alcohol use: No   Drug use: No   Sexual activity: Yes    Partners: Male    Birth control/protection: I.U.D.

## 2022-11-22 DIAGNOSIS — I129 Hypertensive chronic kidney disease with stage 1 through stage 4 chronic kidney disease, or unspecified chronic kidney disease: Secondary | ICD-10-CM | POA: Diagnosis not present

## 2022-11-22 DIAGNOSIS — N184 Chronic kidney disease, stage 4 (severe): Secondary | ICD-10-CM | POA: Diagnosis not present

## 2022-11-22 DIAGNOSIS — D631 Anemia in chronic kidney disease: Secondary | ICD-10-CM | POA: Diagnosis not present

## 2022-11-22 DIAGNOSIS — N2581 Secondary hyperparathyroidism of renal origin: Secondary | ICD-10-CM | POA: Diagnosis not present

## 2022-11-22 DIAGNOSIS — D509 Iron deficiency anemia, unspecified: Secondary | ICD-10-CM | POA: Diagnosis not present

## 2022-11-24 ENCOUNTER — Ambulatory Visit (INDEPENDENT_AMBULATORY_CARE_PROVIDER_SITE_OTHER): Payer: 59 | Admitting: Orthopedic Surgery

## 2022-11-24 ENCOUNTER — Encounter: Payer: Self-pay | Admitting: Orthopedic Surgery

## 2022-11-24 DIAGNOSIS — I87331 Chronic venous hypertension (idiopathic) with ulcer and inflammation of right lower extremity: Secondary | ICD-10-CM | POA: Diagnosis not present

## 2022-11-24 NOTE — Progress Notes (Signed)
Office Visit Note   Patient: Alice [name]           Date of Birth: 09/06/1970           MRN: [id number] Visit Date: 11/24/2022              Requested by: Jackie Plum, MD [address] [place],  [place] [address] PCP: Jackie Plum, MD  Chief Complaint  Patient presents with   Right Leg - Follow-up      HPI: Patient is a 52 year old woman who is seen in follow-up for venous insufficiency chronic ulcer right lower extremity.  Patient has undergone prolonged compression wraps without resolution of the ulcer.  Assessment & Plan: Visit Diagnoses:  1. 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  Reevaluate in 1 week.  Follow-Up Instructions: Return in about 1 week (around 12/01/2022).   Ortho Exam  Patient is alert, oriented, no adenopathy, well-dressed, normal affect, normal respiratory effort. Examination the ulcer has healthy granulation tissue.  The wound healing has stalled, the wound bed has healthy granulation tissue, and patient presents for evaluation and application of Kerecis MariGen Micro graft. After informed consent a 10 blade knife was used to debride the skin and soft tissue to healthy viable bleeding granulation tissue.  Silver nitrate was used for hemostasis. The wound measures: 3 cm in length, 3cm  in width, 2 mm in depth, wound location medial right calf Kerecis MariGen micro tissue graft 8 cm2 was applied, and there was no wastage.  Please see the photo below of the Lot number and expiration date. The micro tissue graft was covered with a nonadherent Adaptic dressing, bolstered with 4 x 4 gauze and secured with a compression wrap.     Imaging: No results found.    Labs: Lab Results  Component Value Date   HGBA1C 5.2 09/15/2020   REPTSTATUS 11/15/2021 FINAL 11/10/2021   GRAMSTAIN  11/10/2021    NO  WBC SEEN RARE GRAM POSITIVE COCCI RARE GRAM POSITIVE RODS    CULT  11/10/2021    FEW METHICILLIN RESISTANT STAPHYLOCOCCUS AUREUS MODERATE CORYNEBACTERIUM STRIATUM Standardized susceptibility testing for this organism is not available. NO ANAEROBES ISOLATED Performed at [facility], [address] [place], [place] [address]    LABORGA METHICILLIN RESISTANT STAPHYLOCOCCUS AUREUS 11/10/2021     Lab Results  Component Value Date   ALBUMIN 4.2 09/15/2020   ALBUMIN 3.4 (L) 05/12/2019   ALBUMIN 2.5 (L) 10/08/2018    Lab Results  Component Value Date   MG 1.9 08/19/2019   MG 1.8 12/29/2016   MG 1.9 12/28/2016   Lab Results  Component Value Date   VD25OH 12.0 (L) 09/15/2020    No results found for: "PREALBUMIN"    Latest Ref Rng & Units 02/08/2022    2:10 PM 02/08/2022    6:30 AM 11/10/2021    5:54 AM  CBC EXTENDED  WBC 4.0 - 10.5 K/uL  5.3  5.2   RBC 3.87 - 5.11 MIL/uL  3.98  3.77   Hemoglobin 12.0 - 15.0 g/dL 24.4  01.0  27.2   HCT 36.0 - 46.0 % 36.0  33.2  31.8   Platelets 150 - 400 K/uL  240  195      There is no height or weight on file to calculate BMI.  Orders:  No orders of the  defined types were placed in this encounter.  No orders of the defined types were placed in this encounter.    Procedures: No procedures performed  Clinical Data: No additional findings.  ROS:  All other systems negative, except as noted in the HPI. Review of Systems  Objective: Vital Signs: There were no vitals taken for this visit.  Specialty Comments:  No specialty comments available.  PMFS History: Patient Active Problem List   Diagnosis Date Noted   H/O skin graft 11/10/2021   Abscess of right lower leg    SOB (shortness of breath) on exertion 09/15/2020   Vitamin D deficiency 09/15/2020   Hyperglycemia 09/15/2020   Subtherapeutic international normalized ratio (INR)    Postoperative pain    Hypoalbuminemia    Acute on chronic kidney failure (HCC)     Acute blood loss anemia    Benign essential HTN    Other fatigue 10/05/2018   Chronic ulcer of right leg, with fat layer exposed (HCC) 09/28/2018   Chronic ulcer of leg, right, with necrosis of muscle (HCC)    Iron deficiency anemia 01/29/2018   Pulmonary embolus (HCC) 01/09/2018   Encounter for therapeutic drug monitoring 01/09/2018   Severe malnutrition (HCC) 08/24/2017   Acute kidney injury superimposed on CKD (HCC) 12/28/2016   Hx of skin graft 08/01/2016   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 (HCC) 07/25/2016   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 (HCC) 07/05/2016   Trichomonal infection 11/24/2015   Abdominal pain 05/02/2015   Symptomatic cholelithiasis 04/16/2015   Acute bilateral upper abdominal pain 04/16/2015   Microcytic anemia 05/18/2013   Hypotension, unspecified 05/17/2013   Anemia due to stage 3b chronic kidney disease (HCC) 05/17/2013   Hypokalemia 05/17/2013   Anal fissure 02/06/2013   Anal skin tag 02/06/2013   ALLERGIC RHINITIS 03/05/2010   LOW BACK PAIN SYNDROME 12/13/2007   OBESITY, MORBID 12/02/2006   HTN (hypertension) 12/02/2006   History of pulmonary embolism 12/02/2006   History of DVT (deep vein thrombosis) 12/02/2006   SYNDROME, POSTPHLEBITIC W/ULCER & INFLM 12/02/2006   GASTROESOPHAGEAL REFLUX DISEASE 12/02/2006   OSA (obstructive sleep apnea) 12/02/2006   Past Medical History:  Diagnosis Date   Anemia    Anxiety    Arthritis    Chest pain    Chronic bronchitis (HCC)    Chronic upper back pain    Depression    Depression    DVT (deep venous thrombosis) (HCC)    BLE   GERD (gastroesophageal reflux disease)    Headache    "weekly" (04/16/2015)   Hyperlipidemia    Hypertension    Joint pain    Migraine    "monthly" (04/16/2015)   Morbid obesity with BMI of 50.0-59.9, adult (HCC)    Obesity    Other fatigue    Pneumonia 2014   Pulmonary embolism (HCC)     Sinusitis nasal    Sleep apnea    "I'm suppose to wear a mask but I don't" (04/16/2015)   SOB (shortness of breath) on exertion    Varicose veins of right lower extremity    Venous stasis of lower extremity    right   Wears dentures    Wears glasses     Family History  Problem Relation Age of Onset   Kidney disease Mother        kidney transplant   Diabetes Mother    Hyperlipidemia Mother    Hypertension Mother  Stroke Mother    Diabetes Father    Hypertension Father    Hyperlipidemia Father    Stroke Father    Kidney disease Father    Schizophrenia Father    Alcoholism Father     Past Surgical History:  Procedure Laterality Date   APPLICATION OF WOUND VAC  11/10/2021   Procedure: APPLICATION OF WOUND VAC;  Surgeon: Nadara Mustard, MD;  Location: [facility] OR;  Service: Orthopedics;;   CHOLECYSTECTOMY N/A 04/18/2015   Procedure: LAPAROSCOPIC CHOLECYSTECTOMY;  Surgeon: Axel Filler, MD;  Location: [facility] OR;  Service: General;  Laterality: N/A;   HYSTEROSCOPY WITH D & C  12/24/2001   Hattie Perch 09/28/2010   I & D EXTREMITY Right 07/25/2016   Procedure: IRRIGATION AND DEBRIDEMENT RIGHT LEG ULCER, APPLY VERAFLO VAC;  Surgeon: Nadara Mustard, MD;  Location: [facility] OR;  Service: Orthopedics;  Laterality: Right;   I & D EXTREMITY Right 09/28/2018   Procedure: DEBRIDEMENT RIGHT LOWER LEG WITH PLACEMENT WITH PLACEMENT OF SKIN GRAFT AND VAC;  Surgeon: Nadara Mustard, MD;  Location: [facility] OR;  Service: Orthopedics;  Laterality: Right;   I & D EXTREMITY Right 10/03/2018   Procedure: REPEAT IRRIGATION AND DEBRIDEMENT RIGHT LOWER LEG, VAC PLACEMENT;  Surgeon: Nadara Mustard, MD;  Location: [facility] OR;  Service: Orthopedics;  Laterality: Right;   I & D EXTREMITY Right 11/10/2021   Procedure: RIGHT LEG DEBRIDEMENT AND APPLY SKIN GRAFT;  Surgeon: Nadara Mustard, MD;  Location: [facility] OR;  Service: Orthopedics;  Laterality: Right;   INCISE AND DRAIN ABCESS Right 07/14/2016   INCISION AND DRAINAGE Right 09/10/2008   leg:  skin  and soft tissue and muscle/notes 09/15/2010   INCISION AND DRAINAGE Right 01/01/2008   Chronic venous stasis insufficiency ulcer,/notes 09/14/2010/   INCISION AND DRAINAGE Right 07/2010   calf w/application wound vac/notes 07/20/2010   INCISION AND DRAINAGE Right 07/25/2016   IRRIGATION AND DEBRIDEMENT RIGHT LEG ULCER,   LAPAROSCOPIC GASTRIC BYPASS  ~ 2007   MULTIPLE TOOTH EXTRACTIONS     SKIN GRAFT SPLIT THICKNESS LEG / FOOT Right 07/25/2016   LEG   SKIN SPLIT GRAFT Right 07/27/2016   Procedure: SKIN GRAFT RIGHT LEG WITH THERASKIN APPLICATION;  Surgeon: Nadara Mustard, MD;  Location: [facility] OR;  Service: Orthopedics;  Laterality: Right;   TONSILLECTOMY     Social History   Occupational History   Occupation: unemployed  Tobacco Use   Smoking status: Never   Smokeless tobacco: Never  Vaping Use   Vaping status: Never Used  Substance and Sexual Activity   Alcohol use: No   Drug use: No   Sexual activity: Yes    Partners: Male    Birth control/protection: I.U.D.

## 2022-12-01 ENCOUNTER — Ambulatory Visit: Payer: 59 | Admitting: Orthopedic Surgery

## 2022-12-01 ENCOUNTER — Encounter: Payer: Self-pay | Admitting: Orthopedic Surgery

## 2022-12-01 DIAGNOSIS — I87331 Chronic venous hypertension (idiopathic) with ulcer and inflammation of right lower extremity: Secondary | ICD-10-CM | POA: Diagnosis not present

## 2022-12-01 DIAGNOSIS — Z9889 Other specified postprocedural states: Secondary | ICD-10-CM

## 2022-12-01 NOTE — Progress Notes (Signed)
Office Visit Note   Patient: Alice [name]           Date of Birth: 1970/09/24           MRN: [id number] Visit Date: 12/01/2022              Requested by: Jackie Plum, MD [address] [place],  [place] [address] PCP: Jackie Plum, MD  Chief Complaint  Patient presents with   Right Leg - Follow-up    S/p in office application of graft last week.       HPI: Patient is a 52 year old woman who is seen in follow-up status post Kerecis tissue graft right lower extremity medial calf ulcer.  Patient feels well has no concerns she feels like it is healing.  Assessment & Plan: Visit Diagnoses:  1. 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 History of artificial skin graft     Plan: Continue with compression and elevation recommend she resume using her Vive socks 24 hours a day  Follow-Up Instructions: Return in about 4 weeks (around 12/29/2022).   Ortho Exam  Patient is alert, oriented, no adenopathy, well-dressed, normal affect, normal respiratory effort. Examination the wound has healthy flat granulation tissue.  There is new superficial epithelialization.  The wound measures 25 x 15 mm in its largest dimension.  Imaging: No results found.   Labs: Lab Results  Component Value Date   HGBA1C 5.2 09/15/2020   REPTSTATUS 11/15/2021 FINAL 11/10/2021   GRAMSTAIN  11/10/2021    NO WBC SEEN RARE GRAM POSITIVE COCCI RARE GRAM POSITIVE RODS    CULT  11/10/2021    FEW METHICILLIN RESISTANT STAPHYLOCOCCUS AUREUS MODERATE CORYNEBACTERIUM STRIATUM Standardized susceptibility testing for this organism is not available. NO ANAEROBES ISOLATED Performed at [facility], [address] [place], [place] [address]    LABORGA METHICILLIN RESISTANT STAPHYLOCOCCUS AUREUS 11/10/2021     Lab Results  Component Value Date   ALBUMIN 4.2 09/15/2020   ALBUMIN 3.4 (L) 05/12/2019   ALBUMIN 2.5  (L) 10/08/2018    Lab Results  Component Value Date   MG 1.9 08/19/2019   MG 1.8 12/29/2016   MG 1.9 12/28/2016   Lab Results  Component Value Date   VD25OH 12.0 (L) 09/15/2020    No results found for: "PREALBUMIN"    Latest Ref Rng & Units 02/08/2022    2:10 PM 02/08/2022    6:30 AM 11/10/2021    5:54 AM  CBC EXTENDED  WBC 4.0 - 10.5 K/uL  5.3  5.2   RBC 3.87 - 5.11 MIL/uL  3.98  3.77   Hemoglobin 12.0 - 15.0 g/dL 58.5  27.7  82.4   HCT 36.0 - 46.0 % 36.0  33.2  31.8   Platelets 150 - 400 K/uL  240  195      There is no height or weight on file to calculate BMI.  Orders:  No orders of the defined types were placed in this encounter.  No orders of the defined types were placed in this encounter.    Procedures: No procedures performed  Clinical Data: No additional findings.  ROS:  All other systems negative, except as noted in the HPI. Review of Systems  Objective: Vital Signs: There were no vitals taken for this visit.  Specialty Comments:  No specialty comments available.  PMFS History: Patient Active Problem List   Diagnosis Date Noted   H/O skin  graft 11/10/2021   Abscess of right lower leg    SOB (shortness of breath) on exertion 09/15/2020   Vitamin D deficiency 09/15/2020   Hyperglycemia 09/15/2020   Subtherapeutic international normalized ratio (INR)    Postoperative pain    Hypoalbuminemia    Acute on chronic kidney failure (HCC)    Acute blood loss anemia    Benign essential HTN    Other fatigue 10/05/2018   Chronic ulcer of right leg, with fat layer exposed (HCC) 09/28/2018   Chronic ulcer of leg, right, with necrosis of muscle (HCC)    Iron deficiency anemia 01/29/2018   Pulmonary embolus (HCC) 01/09/2018   Encounter for therapeutic drug monitoring 01/09/2018   Severe malnutrition (HCC) 08/24/2017   Acute kidney injury superimposed on CKD (HCC) 12/28/2016   Hx of skin graft 08/01/2016   Venous ulcer of right lower extremity with  varicose veins (HCC) 07/25/2016   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 (HCC) 07/05/2016   Trichomonal infection 11/24/2015   Abdominal pain 05/02/2015   Symptomatic cholelithiasis 04/16/2015   Acute bilateral upper abdominal pain 04/16/2015   Microcytic anemia 05/18/2013   Hypotension, unspecified 05/17/2013   Anemia due to stage 3b chronic kidney disease (HCC) 05/17/2013   Hypokalemia 05/17/2013   Anal fissure 02/06/2013   Anal skin tag 02/06/2013   ALLERGIC RHINITIS 03/05/2010   LOW BACK PAIN SYNDROME 12/13/2007   OBESITY, MORBID 12/02/2006   HTN (hypertension) 12/02/2006   History of pulmonary embolism 12/02/2006   History of DVT (deep vein thrombosis) 12/02/2006   SYNDROME, POSTPHLEBITIC W/ULCER & INFLM 12/02/2006   GASTROESOPHAGEAL REFLUX DISEASE 12/02/2006   OSA (obstructive sleep apnea) 12/02/2006   Past Medical History:  Diagnosis Date   Anemia    Anxiety    Arthritis    Chest pain    Chronic bronchitis (HCC)    Chronic upper back pain    Depression    Depression    DVT (deep venous thrombosis) (HCC)    BLE   GERD (gastroesophageal reflux disease)    Headache    "weekly" (04/16/2015)   Hyperlipidemia    Hypertension    Joint pain    Migraine    "monthly" (04/16/2015)   Morbid obesity with BMI of 50.0-59.9, adult (HCC)    Obesity    Other fatigue    Pneumonia 2014   Pulmonary embolism (HCC)    Sinusitis nasal    Sleep apnea    "I'm suppose to wear a mask but I don't" (04/16/2015)   SOB (shortness of breath) on exertion    Varicose veins of right lower extremity    Venous stasis of lower extremity    right   Wears dentures    Wears glasses     Family History  Problem Relation Age of Onset   Kidney disease Mother        kidney transplant   Diabetes Mother    Hyperlipidemia Mother    Hypertension Mother    Stroke Mother    Diabetes Father    Hypertension Father    Hyperlipidemia Father     Stroke Father    Kidney disease Father    Schizophrenia Father    Alcoholism Father     Past Surgical History:  Procedure Laterality Date   APPLICATION OF WOUND VAC  11/10/2021   Procedure: APPLICATION OF WOUND VAC;  Surgeon: Nadara Mustard, MD;  Location: [facility];  Service: Orthopedics;;   CHOLECYSTECTOMY N/A 04/18/2015  Procedure: LAPAROSCOPIC CHOLECYSTECTOMY;  Surgeon: Axel Filler, MD;  Location: [facility] OR;  Service: General;  Laterality: N/A;   HYSTEROSCOPY WITH D & C  12/24/2001   Hattie Perch 09/28/2010   I & D EXTREMITY Right 07/25/2016   Procedure: IRRIGATION AND DEBRIDEMENT RIGHT LEG ULCER, APPLY VERAFLO VAC;  Surgeon: Nadara Mustard, MD;  Location: [facility];  Service: Orthopedics;  Laterality: Right;   I & D EXTREMITY Right 09/28/2018   Procedure: DEBRIDEMENT RIGHT LOWER LEG WITH PLACEMENT WITH PLACEMENT OF SKIN GRAFT AND VAC;  Surgeon: Nadara Mustard, MD;  Location: [facility];  Service: Orthopedics;  Laterality: Right;   I & D EXTREMITY Right 10/03/2018   Procedure: REPEAT IRRIGATION AND DEBRIDEMENT RIGHT LOWER LEG, VAC PLACEMENT;  Surgeon: Nadara Mustard, MD;  Location: [facility];  Service: Orthopedics;  Laterality: Right;   I & D EXTREMITY Right 11/10/2021   Procedure: RIGHT LEG DEBRIDEMENT AND APPLY SKIN GRAFT;  Surgeon: Nadara Mustard, MD;  Location: [facility] OR;  Service: Orthopedics;  Laterality: Right;   INCISE AND DRAIN ABCESS Right 07/14/2016   INCISION AND DRAINAGE Right 09/10/2008   leg:  skin and soft tissue and muscle/notes 09/15/2010   INCISION AND DRAINAGE Right 01/01/2008   Chronic venous stasis insufficiency ulcer,/notes 09/14/2010/   INCISION AND DRAINAGE Right 07/2010   calf w/application wound vac/notes 07/20/2010   INCISION AND DRAINAGE Right 07/25/2016   IRRIGATION AND DEBRIDEMENT RIGHT LEG ULCER,   LAPAROSCOPIC GASTRIC BYPASS  ~ 2007   MULTIPLE TOOTH EXTRACTIONS     SKIN GRAFT SPLIT THICKNESS LEG / FOOT Right 07/25/2016   LEG   SKIN SPLIT GRAFT Right 07/27/2016   Procedure: SKIN GRAFT RIGHT  LEG WITH THERASKIN APPLICATION;  Surgeon: Nadara Mustard, MD;  Location: [facility] OR;  Service: Orthopedics;  Laterality: Right;   TONSILLECTOMY     Social History   Occupational History   Occupation: unemployed  Tobacco Use   Smoking status: Never   Smokeless tobacco: Never  Vaping Use   Vaping status: Never Used  Substance and Sexual Activity   Alcohol use: No   Drug use: No   Sexual activity: Yes    Partners: Male    Birth control/protection: I.U.D.

## 2022-12-13 DIAGNOSIS — G4733 Obstructive sleep apnea (adult) (pediatric): Secondary | ICD-10-CM | POA: Diagnosis not present

## 2022-12-13 DIAGNOSIS — R7303 Prediabetes: Secondary | ICD-10-CM | POA: Diagnosis not present

## 2022-12-13 DIAGNOSIS — G8929 Other chronic pain: Secondary | ICD-10-CM | POA: Diagnosis not present

## 2022-12-13 DIAGNOSIS — Z7901 Long term (current) use of anticoagulants: Secondary | ICD-10-CM | POA: Diagnosis not present

## 2022-12-13 DIAGNOSIS — G629 Polyneuropathy, unspecified: Secondary | ICD-10-CM | POA: Diagnosis not present

## 2022-12-13 DIAGNOSIS — N1831 Chronic kidney disease, stage 3a: Secondary | ICD-10-CM | POA: Diagnosis not present

## 2022-12-13 DIAGNOSIS — E782 Mixed hyperlipidemia: Secondary | ICD-10-CM | POA: Diagnosis not present

## 2022-12-13 DIAGNOSIS — I10 Essential (primary) hypertension: Secondary | ICD-10-CM | POA: Diagnosis not present

## 2022-12-13 DIAGNOSIS — R1013 Epigastric pain: Secondary | ICD-10-CM | POA: Diagnosis not present

## 2022-12-13 DIAGNOSIS — M5442 Lumbago with sciatica, left side: Secondary | ICD-10-CM | POA: Diagnosis not present

## 2022-12-26 DIAGNOSIS — N184 Chronic kidney disease, stage 4 (severe): Secondary | ICD-10-CM | POA: Diagnosis not present

## 2022-12-29 ENCOUNTER — Ambulatory Visit: Payer: 59 | Admitting: Orthopedic Surgery

## 2023-01-18 DIAGNOSIS — I129 Hypertensive chronic kidney disease with stage 1 through stage 4 chronic kidney disease, or unspecified chronic kidney disease: Secondary | ICD-10-CM | POA: Diagnosis not present

## 2023-01-18 DIAGNOSIS — D631 Anemia in chronic kidney disease: Secondary | ICD-10-CM | POA: Diagnosis not present

## 2023-01-18 DIAGNOSIS — D509 Iron deficiency anemia, unspecified: Secondary | ICD-10-CM | POA: Diagnosis not present

## 2023-01-18 DIAGNOSIS — N2581 Secondary hyperparathyroidism of renal origin: Secondary | ICD-10-CM | POA: Diagnosis not present

## 2023-01-18 DIAGNOSIS — N184 Chronic kidney disease, stage 4 (severe): Secondary | ICD-10-CM | POA: Diagnosis not present

## 2023-02-08 ENCOUNTER — Encounter: Payer: Self-pay | Admitting: Oncology

## 2023-02-16 DIAGNOSIS — N1831 Chronic kidney disease, stage 3a: Secondary | ICD-10-CM | POA: Diagnosis not present

## 2023-03-14 DIAGNOSIS — N184 Chronic kidney disease, stage 4 (severe): Secondary | ICD-10-CM | POA: Diagnosis not present

## 2023-03-22 DIAGNOSIS — I120 Hypertensive chronic kidney disease with stage 5 chronic kidney disease or end stage renal disease: Secondary | ICD-10-CM | POA: Diagnosis not present

## 2023-03-22 DIAGNOSIS — D631 Anemia in chronic kidney disease: Secondary | ICD-10-CM | POA: Diagnosis not present

## 2023-03-22 DIAGNOSIS — N2581 Secondary hyperparathyroidism of renal origin: Secondary | ICD-10-CM | POA: Diagnosis not present

## 2023-03-22 DIAGNOSIS — N185 Chronic kidney disease, stage 5: Secondary | ICD-10-CM | POA: Diagnosis not present

## 2023-03-23 DIAGNOSIS — I10 Essential (primary) hypertension: Secondary | ICD-10-CM | POA: Diagnosis not present

## 2023-03-23 DIAGNOSIS — Z7901 Long term (current) use of anticoagulants: Secondary | ICD-10-CM | POA: Diagnosis not present

## 2023-03-23 DIAGNOSIS — Z0001 Encounter for general adult medical examination with abnormal findings: Secondary | ICD-10-CM | POA: Diagnosis not present

## 2023-03-23 DIAGNOSIS — G8929 Other chronic pain: Secondary | ICD-10-CM | POA: Diagnosis not present

## 2023-03-23 DIAGNOSIS — R7303 Prediabetes: Secondary | ICD-10-CM | POA: Diagnosis not present

## 2023-03-23 DIAGNOSIS — R42 Dizziness and giddiness: Secondary | ICD-10-CM | POA: Diagnosis not present

## 2023-03-23 DIAGNOSIS — M5442 Lumbago with sciatica, left side: Secondary | ICD-10-CM | POA: Diagnosis not present

## 2023-03-23 DIAGNOSIS — E782 Mixed hyperlipidemia: Secondary | ICD-10-CM | POA: Diagnosis not present

## 2023-03-23 DIAGNOSIS — N1831 Chronic kidney disease, stage 3a: Secondary | ICD-10-CM | POA: Diagnosis not present

## 2023-03-23 DIAGNOSIS — G4733 Obstructive sleep apnea (adult) (pediatric): Secondary | ICD-10-CM | POA: Diagnosis not present

## 2023-03-23 DIAGNOSIS — R1013 Epigastric pain: Secondary | ICD-10-CM | POA: Diagnosis not present

## 2023-03-23 DIAGNOSIS — G629 Polyneuropathy, unspecified: Secondary | ICD-10-CM | POA: Diagnosis not present

## 2023-04-18 DIAGNOSIS — N185 Chronic kidney disease, stage 5: Secondary | ICD-10-CM | POA: Diagnosis not present

## 2023-04-26 DIAGNOSIS — I120 Hypertensive chronic kidney disease with stage 5 chronic kidney disease or end stage renal disease: Secondary | ICD-10-CM | POA: Diagnosis not present

## 2023-04-26 DIAGNOSIS — N2581 Secondary hyperparathyroidism of renal origin: Secondary | ICD-10-CM | POA: Diagnosis not present

## 2023-04-26 DIAGNOSIS — N185 Chronic kidney disease, stage 5: Secondary | ICD-10-CM | POA: Diagnosis not present

## 2023-04-26 DIAGNOSIS — N390 Urinary tract infection, site not specified: Secondary | ICD-10-CM | POA: Diagnosis not present

## 2023-04-26 DIAGNOSIS — D631 Anemia in chronic kidney disease: Secondary | ICD-10-CM | POA: Diagnosis not present

## 2023-05-25 DIAGNOSIS — N185 Chronic kidney disease, stage 5: Secondary | ICD-10-CM | POA: Diagnosis not present

## 2023-05-29 DIAGNOSIS — I120 Hypertensive chronic kidney disease with stage 5 chronic kidney disease or end stage renal disease: Secondary | ICD-10-CM | POA: Diagnosis not present

## 2023-05-29 DIAGNOSIS — N185 Chronic kidney disease, stage 5: Secondary | ICD-10-CM | POA: Diagnosis not present

## 2023-05-29 DIAGNOSIS — N2581 Secondary hyperparathyroidism of renal origin: Secondary | ICD-10-CM | POA: Diagnosis not present

## 2023-05-29 DIAGNOSIS — D631 Anemia in chronic kidney disease: Secondary | ICD-10-CM | POA: Diagnosis not present

## 2023-05-31 ENCOUNTER — Ambulatory Visit: Payer: 59 | Admitting: Family

## 2023-06-01 ENCOUNTER — Encounter: Payer: Self-pay | Admitting: Oncology

## 2023-06-01 DIAGNOSIS — R197 Diarrhea, unspecified: Secondary | ICD-10-CM | POA: Diagnosis not present

## 2023-06-01 DIAGNOSIS — A084 Viral intestinal infection, unspecified: Secondary | ICD-10-CM | POA: Diagnosis not present

## 2023-06-01 DIAGNOSIS — R1114 Bilious vomiting: Secondary | ICD-10-CM | POA: Diagnosis not present

## 2023-06-03 ENCOUNTER — Emergency Department (HOSPITAL_COMMUNITY)
Admission: EM | Admit: 2023-06-03 | Discharge: 2023-06-04 | Disposition: A | Discharge status: Home / Self Care | Payer: 59 | Attending: Emergency Medicine | Admitting: Emergency Medicine

## 2023-06-03 DIAGNOSIS — R079 Chest pain, unspecified: Secondary | ICD-10-CM | POA: Diagnosis not present

## 2023-06-03 DIAGNOSIS — R059 Cough, unspecified: Secondary | ICD-10-CM | POA: Diagnosis not present

## 2023-06-03 DIAGNOSIS — R0789 Other chest pain: Secondary | ICD-10-CM | POA: Diagnosis not present

## 2023-06-03 DIAGNOSIS — R112 Nausea with vomiting, unspecified: Secondary | ICD-10-CM | POA: Diagnosis not present

## 2023-06-03 DIAGNOSIS — R197 Diarrhea, unspecified: Secondary | ICD-10-CM | POA: Diagnosis not present

## 2023-06-04 ENCOUNTER — Other Ambulatory Visit: Payer: Self-pay

## 2023-06-04 ENCOUNTER — Encounter (HOSPITAL_COMMUNITY): Payer: Self-pay | Admitting: *Deleted

## 2023-06-04 ENCOUNTER — Emergency Department (HOSPITAL_COMMUNITY): Payer: 59

## 2023-06-04 DIAGNOSIS — R0789 Other chest pain: Secondary | ICD-10-CM | POA: Diagnosis not present

## 2023-06-04 DIAGNOSIS — R079 Chest pain, unspecified: Secondary | ICD-10-CM | POA: Diagnosis not present

## 2023-06-04 LAB — CBC
HCT: 36.8 % (ref 36.0–46.0)
Hemoglobin: 12.3 g/dL (ref 12.0–15.0)
MCH: 27.1 pg (ref 26.0–34.0)
MCHC: 33.4 g/dL (ref 30.0–36.0)
MCV: 81.1 fL (ref 80.0–100.0)
Platelets: 249 10*3/uL (ref 150–400)
RBC: 4.54 MIL/uL (ref 3.87–5.11)
RDW: 15 % (ref 11.5–15.5)
WBC: 6.1 10*3/uL (ref 4.0–10.5)
nRBC: 0 % (ref 0.0–0.2)

## 2023-06-04 LAB — BASIC METABOLIC PANEL
Anion gap: 13 (ref 5–15)
BUN: 46 mg/dL — ABNORMAL HIGH (ref 6–20)
CO2: 22 mmol/L (ref 22–32)
Calcium: 9.4 mg/dL (ref 8.9–10.3)
Chloride: 105 mmol/L (ref 98–111)
Creatinine, Ser: 5.18 mg/dL — ABNORMAL HIGH (ref 0.44–1.00)
GFR, Estimated: 9 mL/min — ABNORMAL LOW (ref >60)
Glucose, Bld: 92 mg/dL (ref 70–99)
Potassium: 3.3 mmol/L — ABNORMAL LOW (ref 3.5–5.1)
Sodium: 140 mmol/L (ref 135–145)

## 2023-06-04 LAB — TROPONIN I (HIGH SENSITIVITY)
Troponin I (High Sensitivity): 14 ng/L (ref <18)
Troponin I (High Sensitivity): 15 ng/L (ref <18)

## 2023-06-04 LAB — HCG, SERUM, QUALITATIVE: Preg, Serum: NEGATIVE

## 2023-06-04 MED ORDER — LACTATED RINGERS IV BOLUS
1000.0000 mL | Freq: Once | INTRAVENOUS | Status: AC
  Administered 2023-06-04: 1000 mL via INTRAVENOUS

## 2023-06-04 MED ORDER — MORPHINE SULFATE (PF) 4 MG/ML IV SOLN
4.0000 mg | Freq: Once | INTRAVENOUS | Status: AC
  Administered 2023-06-04: 4 mg via INTRAVENOUS
  Filled 2023-06-04: qty 1

## 2023-06-04 MED ORDER — POTASSIUM CHLORIDE CRYS ER 20 MEQ PO TBCR
40.0000 meq | EXTENDED_RELEASE_TABLET | Freq: Once | ORAL | Status: AC
  Administered 2023-06-04: 40 meq via ORAL
  Filled 2023-06-04: qty 2

## 2023-06-04 NOTE — ED Provider Notes (Signed)
[facility]-EM[facility]PT [facility] Emergency Department Provider Note MRN:  [id number]  Arrival date & time: 06/04/23     Chief Complaint   Chest Pain   History of Present Illness   Alice [name] is a 53 y.o. year-old female presents to the ED with chief complaint of left-sided chest wall pain.  She states that the pain is worsened with palpation and also when she lies down.  It is exacerbated with cough and when she had vomiting.  She states that she just recovered from several days of nausea, vomiting, and diarrhea.  She states that she was seen by her regular doctor and was advised to come to the emergency department for dehydration.  Patient states that she is feeling better with regard to the GI symptoms.  She still has the chest pain which has been persistent and is worsened with palpation.Marland Kitchen  History provided by patient.   Review of Systems  Pertinent positive and negative review of systems noted in HPI.    Physical Exam   Vitals:   06/04/23 0215 06/04/23 0300  BP: (!) 151/99 (!) 137/97  Pulse: 83 85  Resp: 19 14  Temp:    SpO2: 100% 100%    CONSTITUTIONAL: Nontoxic-appearing, NAD NEURO:  Alert and oriented x 3, CN 3-12 grossly intact EYES:  eyes equal and reactive ENT/NECK:  Supple, no stridor CARDIO: Normal rate, regular rhythm, appears well-perfused, left anterior chest wall is tender to palpation PULM:  No respiratory distress, clear to auscultation bilaterally GI/GU:  non-distended, MSK/SPINE:  No gross deformities, no edema, moves all extremities SKIN:  no rash, atraumatic, no rash on chest   *Additional and/or pertinent findings included in MDM below  Diagnostic and Interventional Summary    EKG Interpretation Date/Time:    Ventricular Rate:    PR Interval:    QRS Duration:    QT Interval:    QTC Calculation:   R Axis:      Text Interpretation:         Labs Reviewed  BASIC METABOLIC PANEL - Abnormal; Notable for the following components:       Result Value   Potassium 3.3 (*)    BUN 46 (*)    Creatinine, Ser 5.18 (*)    GFR, Estimated 9 (*)    All other components within normal limits  CBC  HCG, SERUM, QUALITATIVE  TROPONIN I (HIGH SENSITIVITY)  TROPONIN I (HIGH SENSITIVITY)    DG Chest 2 View  Final Result      Medications  lactated ringers bolus 1,000 mL (1,000 mLs Intravenous New Bag/Given 06/04/23 0302)  potassium chloride SA (KLOR-CON M) CR tablet 40 mEq (40 mEq Oral Given 06/04/23 0124)  morphine (PF) 4 MG/ML injection 4 mg (4 mg Intravenous Given 06/04/23 0259)     Procedures  /  Critical Care Procedures  ED Course and Medical Decision Making  I have reviewed the triage vital signs, the nursing notes, and pertinent available records from the EMR.  Social Determinants Affecting Complexity of Care: Patient has no clinically significant social determinants affecting this chief complaint..   ED Course: Clinical Course as of 06/04/23 0401  Sun Jun 04, 2023  0359 Basic metabolic panel(!) Creatinine noted to be 5.18, last comparison was in 2023.  Patient tells me that her GFR from earlier this week was 9 and that her PCP called her nephrologist, who told her that most recent GFR was 11, so not significantly changed. [RB]  0359 CBC No significant leukocytosis  or anemia [RB]  0359 Troponin I (High Sensitivity) Initial troponin is 15, repeat is 14, doubt ACS [RB]    Clinical Course User Index [RB] Roxy Horseman, PA-C    Medical Decision Making Patient here with left-sided chest wall pain.  Her symptoms are easily reproducible with palpation.  She is recovering from GI illness with nausea, vomiting, diarrhea.  I suspect that her chest wall pain is secondary to the retching she had had while vomiting.  I suspect this is muscle strain and soreness.  Again, it is easily reproducible.    Labs and imaging are reassuring and are discussed below.  I did consider admission to the hospital based on the patient's  kidney function, but patient notes that her GFR is essentially unchanged and she would prefer to go home.  I have given her a liter of fluid here.  She states that she feels a little better.  I have encouraged her to follow-up with her primary care doctor.  Risk Prescription drug management.         Consultants: No consultations were needed in caring for this patient.   Treatment and Plan: I considered admission due to patient's initial presentation, but after considering the examination and diagnostic results, patient will not require admission and can be discharged with outpatient follow-up.    Final Clinical Impressions(s) / ED Diagnoses     ICD-10-CM   1. Chest wall pain  R07.89       ED Discharge Orders     None         Discharge Instructions Discussed with and Provided to Patient:    Discharge Instructions      Your chest pain is thought to be due to the retching that you had had from vomiting and is likely chest wall muscle strain and intercostal muscle strain.  You can apply ice and heat.  This should heal up over the next few days.  Your heart marker and chest x-ray looked good.  Please follow-up with your regular doctor regarding your kidney function.      Sharl, Ahlquist, PA-C 06/04/23 [id number]    Marily Memos, MD 06/04/23 2257

## 2023-06-04 NOTE — Discharge Instructions (Signed)
Your chest pain is thought to be due to the retching that you had had from vomiting and is likely chest wall muscle strain and intercostal muscle strain.  You can apply ice and heat.  This should heal up over the next few days.  Your heart marker and chest x-ray looked good.  Please follow-up with your regular doctor regarding your kidney function.

## 2023-06-04 NOTE — ED Triage Notes (Signed)
The pt is c/o chest pain for the past week especially when she lies down  she saw her doctor earlier this week and the doctor wanted to send her to the ed but she did not want to go

## 2023-06-07 ENCOUNTER — Ambulatory Visit: Payer: 59 | Admitting: Family

## 2023-06-07 ENCOUNTER — Ambulatory Visit (INDEPENDENT_AMBULATORY_CARE_PROVIDER_SITE_OTHER): Payer: 59 | Admitting: Family

## 2023-06-07 ENCOUNTER — Encounter: Payer: Self-pay | Admitting: Family

## 2023-06-07 DIAGNOSIS — Z945 Skin transplant status: Secondary | ICD-10-CM | POA: Diagnosis not present

## 2023-06-07 DIAGNOSIS — I87331 Chronic venous hypertension (idiopathic) with ulcer and inflammation of right lower extremity: Secondary | ICD-10-CM

## 2023-06-07 NOTE — Progress Notes (Signed)
Office Visit Note   Patient: Alice [name]           Date of Birth: 1970-07-24           MRN: [id number] Visit Date: 06/07/2023              Requested by: Jackie Plum, MD [address],  [place] [address] PCP: Jackie Plum, MD  Chief Complaint  Patient presents with   Right Leg - Wound Check      HPI: The patient is a 53 year old woman who presents in follow-up for chronic venous stasis insufficiency ulcer to the right medial calf.  She has not had any recent worsening however is frustrated by its chronic nature and lack of improvement  Assessment & Plan: Visit Diagnoses: No diagnosis found.  Plan: KERECIs micro powder applied today after Vashe cleansing.  Patient voiced understanding she will leave this in place for the next 1 week then she will do Vashe to dry dressings for 1 week follow-up in 2 weeks  Follow-Up Instructions: No follow-ups on file.   Ortho Exam  Patient is alert, oriented, no adenopathy, well-dressed, normal affect, normal respiratory effort. On examination right lower extremity the medial calf ulcer is 2-1/2 cm x 1-1/2 cm filled in with flat pink tissue there is no surrounding erythema warmth no fluctuance no tenderness no sign of infection  Kerecis micro powder applied today after informed consent.  Imaging: No results found. No images are attached to the encounter.  Labs: Lab Results  Component Value Date   HGBA1C 5.2 09/15/2020   REPTSTATUS 11/15/2021 FINAL 11/10/2021   GRAMSTAIN  11/10/2021    NO WBC SEEN RARE GRAM POSITIVE COCCI RARE GRAM POSITIVE RODS    CULT  11/10/2021    FEW METHICILLIN RESISTANT STAPHYLOCOCCUS AUREUS MODERATE CORYNEBACTERIUM STRIATUM Standardized susceptibility testing for this organism is not available. NO ANAEROBES ISOLATED Performed at [facility], [address] [place], [place] [address]    [facility] METHICILLIN RESISTANT STAPHYLOCOCCUS AUREUS 11/10/2021      Lab Results  Component Value Date   ALBUMIN 4.2 09/15/2020   ALBUMIN 3.4 (L) 05/12/2019   ALBUMIN 2.5 (L) 10/08/2018    Lab Results  Component Value Date   MG 1.9 08/19/2019   MG 1.8 12/29/2016   MG 1.9 12/28/2016   Lab Results  Component Value Date   VD25OH 12.0 (L) 09/15/2020    No results found for: "PREALBUMIN"    Latest Ref Rng & Units 06/04/2023   12:12 AM 02/08/2022    2:10 PM 02/08/2022    6:30 AM  CBC EXTENDED  WBC 4.0 - 10.5 K/uL 6.1   5.3   RBC 3.87 - 5.11 MIL/uL 4.54   3.98   Hemoglobin 12.0 - 15.0 g/dL 74.2  59.5  63.8   HCT 36.0 - 46.0 % 36.8  36.0  33.2   Platelets 150 - 400 K/uL 249   240      There is no height or weight on file to calculate BMI.  Orders:  No orders of the defined types were placed in this encounter.  No orders of the defined types were placed in this encounter.    Procedures: No procedures performed  Clinical Data: No additional findings.  ROS:  All other systems negative, except as noted in the HPI. Review of Systems  Objective: Vital Signs: There were no vitals taken for this visit.  Specialty Comments:  No specialty comments available.  PMFS  History: Patient Active Problem List   Diagnosis Date Noted   H/O skin graft 11/10/2021   Abscess of right lower leg    SOB (shortness of breath) on exertion 09/15/2020   Vitamin D deficiency 09/15/2020   Hyperglycemia 09/15/2020   Subtherapeutic international normalized ratio (INR)    Postoperative pain    Hypoalbuminemia    Acute on chronic kidney failure (HCC)    Acute blood loss anemia    Benign essential HTN    Other fatigue 10/05/2018   Chronic ulcer of right leg, with fat layer exposed (HCC) 09/28/2018   Chronic ulcer of leg, right, with necrosis of muscle (HCC)    Iron deficiency anemia 01/29/2018   Pulmonary embolus (HCC) 01/09/2018   Encounter for therapeutic drug monitoring 01/09/2018   Severe malnutrition (HCC) 08/24/2017   Acute kidney injury  superimposed on CKD (HCC) 12/28/2016   Hx of skin graft 08/01/2016   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 (HCC) 07/25/2016   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 (HCC) 07/05/2016   Trichomonal infection 11/24/2015   Abdominal pain 05/02/2015   Symptomatic cholelithiasis 04/16/2015   Acute bilateral upper abdominal pain 04/16/2015   Microcytic anemia 05/18/2013   Hypotension, unspecified 05/17/2013   Anemia due to stage 3b chronic kidney disease (HCC) 05/17/2013   Hypokalemia 05/17/2013   Anal fissure 02/06/2013   Anal skin tag 02/06/2013   Allergic rhinitis 03/05/2010   LOW BACK PAIN SYNDROME 12/13/2007   OBESITY, MORBID 12/02/2006   HTN (hypertension) 12/02/2006   History of pulmonary embolism 12/02/2006   History of DVT (deep vein thrombosis) 12/02/2006   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/02/2006   GASTROESOPHAGEAL REFLUX DISEASE 12/02/2006   OSA (obstructive sleep apnea) 12/02/2006   Past Medical History:  Diagnosis Date   Anemia    Anxiety    Arthritis    Chest pain    Chronic bronchitis (HCC)    Chronic upper back pain    Depression    Depression    DVT (deep venous thrombosis) (HCC)    BLE   GERD (gastroesophageal reflux disease)    Headache    "weekly" (04/16/2015)   Hyperlipidemia    Hypertension    Joint pain    Migraine    "monthly" (04/16/2015)   Morbid obesity with BMI of 50.0-59.9, adult (HCC)    Obesity    Other fatigue    Pneumonia 2014   Pulmonary embolism (HCC)    Sinusitis nasal    Sleep apnea    "I'm suppose to wear a mask but I don't" (04/16/2015)   SOB (shortness of breath) on exertion    Varicose veins of right lower extremity    Venous stasis of lower extremity    right   Wears dentures    Wears glasses     Family History  Problem Relation Age of Onset   Kidney disease Mother        kidney transplant   Diabetes Mother     Hyperlipidemia Mother    Hypertension Mother    Stroke Mother    Diabetes Father    Hypertension Father    Hyperlipidemia Father    Stroke Father    Kidney disease Father    Schizophrenia Father    Alcoholism Father     Past Surgical History:  Procedure Laterality Date   APPLICATION OF WOUND VAC  11/10/2021   Procedure: APPLICATION OF WOUND VAC;  Surgeon: Nadara Mustard, MD;  Location: [facility] OR;  Service: Orthopedics;;   CHOLECYSTECTOMY N/A 04/18/2015   Procedure: LAPAROSCOPIC CHOLECYSTECTOMY;  Surgeon: Axel Filler, MD;  Location: [facility] OR;  Service: General;  Laterality: N/A;   HYSTEROSCOPY WITH D & C  12/24/2001   Hattie Perch 09/28/2010   I & D EXTREMITY Right 07/25/2016   Procedure: IRRIGATION AND DEBRIDEMENT RIGHT LEG ULCER, APPLY VERAFLO VAC;  Surgeon: Nadara Mustard, MD;  Location: [facility] OR;  Service: Orthopedics;  Laterality: Right;   I & D EXTREMITY Right 09/28/2018   Procedure: DEBRIDEMENT RIGHT LOWER LEG WITH PLACEMENT WITH PLACEMENT OF SKIN GRAFT AND VAC;  Surgeon: Nadara Mustard, MD;  Location: [facility] OR;  Service: Orthopedics;  Laterality: Right;   I & D EXTREMITY Right 10/03/2018   Procedure: REPEAT IRRIGATION AND DEBRIDEMENT RIGHT LOWER LEG, VAC PLACEMENT;  Surgeon: Nadara Mustard, MD;  Location: [facility] OR;  Service: Orthopedics;  Laterality: Right;   I & D EXTREMITY Right 11/10/2021   Procedure: RIGHT LEG DEBRIDEMENT AND APPLY SKIN GRAFT;  Surgeon: Nadara Mustard, MD;  Location: [facility] OR;  Service: Orthopedics;  Laterality: Right;   INCISE AND DRAIN ABCESS Right 07/14/2016   INCISION AND DRAINAGE Right 09/10/2008   leg:  skin and soft tissue and muscle/notes 09/15/2010   INCISION AND DRAINAGE Right 01/01/2008   Chronic venous stasis insufficiency ulcer,/notes 09/14/2010/   INCISION AND DRAINAGE Right 07/2010   calf w/application wound vac/notes 07/20/2010   INCISION AND DRAINAGE Right 07/25/2016   IRRIGATION AND DEBRIDEMENT RIGHT LEG ULCER,   LAPAROSCOPIC GASTRIC BYPASS  ~ 2007   MULTIPLE TOOTH  EXTRACTIONS     SKIN GRAFT SPLIT THICKNESS LEG / FOOT Right 07/25/2016   LEG   SKIN SPLIT GRAFT Right 07/27/2016   Procedure: SKIN GRAFT RIGHT LEG WITH THERASKIN APPLICATION;  Surgeon: Nadara Mustard, MD;  Location: [facility] OR;  Service: Orthopedics;  Laterality: Right;   TONSILLECTOMY     Social History   Occupational History   Occupation: unemployed  Tobacco Use   Smoking status: Never   Smokeless tobacco: Never  Vaping Use   Vaping status: Never Used  Substance and Sexual Activity   Alcohol use: No   Drug use: No   Sexual activity: Yes    Partners: Male    Birth control/protection: I.U.D.

## 2023-06-17 ENCOUNTER — Encounter: Payer: Self-pay | Admitting: Oncology

## 2023-06-21 ENCOUNTER — Encounter: Payer: Self-pay | Admitting: Family

## 2023-06-21 ENCOUNTER — Ambulatory Visit (INDEPENDENT_AMBULATORY_CARE_PROVIDER_SITE_OTHER): Payer: 59 | Admitting: Family

## 2023-06-21 DIAGNOSIS — L97919 Non-pressure chronic ulcer of unspecified part of right lower leg with unspecified severity: Secondary | ICD-10-CM

## 2023-06-21 DIAGNOSIS — Z945 Skin transplant status: Secondary | ICD-10-CM | POA: Diagnosis not present

## 2023-06-21 DIAGNOSIS — I83019 Varicose veins of right lower extremity with ulcer of unspecified site: Secondary | ICD-10-CM

## 2023-06-21 DIAGNOSIS — I87331 Chronic venous hypertension (idiopathic) with ulcer and inflammation of right lower extremity: Secondary | ICD-10-CM

## 2023-06-21 NOTE — Progress Notes (Signed)
 Office Visit Note   Patient: Alice [name]           Date of Birth: Jan 21, 1971           MRN: [id number] Visit Date: 06/21/2023              Requested by: Catalina Bare, MD [address] [place],  [place] [address] PCP: Catalina Bare, MD  Chief Complaint  Patient presents with   Right Leg - Follow-up      HPI: The patient is a 53 year old woman who presents in follow-up for chronic venous stasis insufficiency ulcer to the right medial calf.  Feels the wound has improved since last visit when we applied kerecis micropowder. Assessment & Plan: Visit Diagnoses: No diagnosis found.  Plan: donation of kerecis micro powder applied today after Vashe cleansing.  Patient voiced understanding she will leave this in place for the next 1 week then she will do Vashe to dry dressings for 1 week follow-up in 2 weeks  Follow-Up Instructions: No follow-ups on file.   Ortho Exam  Patient is alert, oriented, no adenopathy, well-dressed, normal affect, normal respiratory effort. On examination right lower extremity the medial calf ulcer is 3 cm x 1-1/2 cm filled in with flat pink tissue, new epithelialization islands. there is no surrounding erythema warmth no fluctuance no tenderness no sign of infection Cleansed with vashe today. Kerecis micro powder applied today after informed consent.  Imaging: No results found. No images are attached to the encounter.  Labs: Lab Results  Component Value Date   HGBA1C 5.2 09/15/2020   REPTSTATUS 11/15/2021 FINAL 11/10/2021   GRAMSTAIN  11/10/2021    NO WBC SEEN RARE GRAM POSITIVE COCCI RARE GRAM POSITIVE RODS    CULT  11/10/2021    FEW METHICILLIN RESISTANT STAPHYLOCOCCUS AUREUS MODERATE CORYNEBACTERIUM STRIATUM Standardized susceptibility testing for this organism is not available. NO ANAEROBES ISOLATED Performed at [facility], [address] [place], [place] [address]    [facility] METHICILLIN RESISTANT  STAPHYLOCOCCUS AUREUS 11/10/2021     Lab Results  Component Value Date   ALBUMIN  4.2 09/15/2020   ALBUMIN  3.4 (L) 05/12/2019   ALBUMIN  2.5 (L) 10/08/2018    Lab Results  Component Value Date   MG 1.9 08/19/2019   MG 1.8 12/29/2016   MG 1.9 12/28/2016   Lab Results  Component Value Date   VD25OH 12.0 (L) 09/15/2020    No results found for: PREALBUMIN    Latest Ref Rng & Units 06/04/2023   12:12 AM 02/08/2022    2:10 PM 02/08/2022    6:30 AM  CBC EXTENDED  WBC 4.0 - 10.5 K/uL 6.1   5.3   RBC 3.87 - 5.11 MIL/uL 4.54   3.98   Hemoglobin 12.0 - 15.0 g/dL 87.6  87.7  88.7   HCT 36.0 - 46.0 % 36.8  36.0  33.2   Platelets 150 - 400 K/uL 249   240      There is no height or weight on file to calculate BMI.  Orders:  No orders of the defined types were placed in this encounter.  No orders of the defined types were placed in this encounter.    Procedures: No procedures performed  Clinical Data: No additional findings.  ROS:  All other systems negative, except as noted in the HPI. Review of Systems  Objective: Vital Signs: There were no vitals taken for this visit.  Specialty Comments:  No specialty comments available.  PMFS History: Patient Active Problem List   Diagnosis Date Noted   H/O skin graft 11/10/2021   Abscess of right lower leg    SOB (shortness of breath) on exertion 09/15/2020   Vitamin D  deficiency 09/15/2020   Hyperglycemia 09/15/2020   Subtherapeutic international normalized ratio (INR)    Postoperative pain    Hypoalbuminemia    Acute on chronic kidney failure (HCC)    Acute blood loss anemia    Benign essential HTN    Other fatigue 10/05/2018   Chronic ulcer of right leg, with fat layer exposed (HCC) 09/28/2018   Chronic ulcer of leg, right, with necrosis of muscle (HCC)    Iron deficiency anemia 01/29/2018   Pulmonary embolus (HCC) 01/09/2018   Encounter for therapeutic drug monitoring 01/09/2018   Severe malnutrition (HCC)  08/24/2017   Acute kidney injury superimposed on CKD (HCC) 12/28/2016   Hx of skin graft 08/01/2016   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 (HCC) 07/25/2016   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 (HCC) 07/05/2016   Trichomonal infection 11/24/2015   Abdominal pain 05/02/2015   Symptomatic cholelithiasis 04/16/2015   Acute bilateral upper abdominal pain 04/16/2015   Microcytic anemia 05/18/2013   Hypotension, unspecified 05/17/2013   Anemia due to stage 3b chronic kidney disease (HCC) 05/17/2013   Hypokalemia 05/17/2013   Anal fissure 02/06/2013   Anal skin tag 02/06/2013   Allergic rhinitis 03/05/2010   LOW BACK PAIN SYNDROME 12/13/2007   OBESITY, MORBID 12/02/2006   HTN (hypertension) 12/02/2006   History of pulmonary embolism 12/02/2006   History of DVT (deep vein thrombosis) 12/02/2006   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/02/2006   GASTROESOPHAGEAL REFLUX DISEASE 12/02/2006   OSA (obstructive sleep apnea) 12/02/2006   Past Medical History:  Diagnosis Date   Anemia    Anxiety    Arthritis    Chest pain    Chronic bronchitis (HCC)    Chronic upper back pain    Depression    Depression    DVT (deep venous thrombosis) (HCC)    BLE   GERD (gastroesophageal reflux disease)    Headache    weekly (04/16/2015)   Hyperlipidemia    Hypertension    Joint pain    Migraine    monthly (04/16/2015)   Morbid obesity with BMI of 50.0-59.9, adult (HCC)    Obesity    Other fatigue    Pneumonia 2014   Pulmonary embolism (HCC)    Sinusitis nasal    Sleep apnea    I'm suppose to wear a mask but I don't (04/16/2015)   SOB (shortness of breath) on exertion    Varicose veins of right lower extremity    Venous stasis of lower extremity    right   Wears dentures    Wears glasses     Family History  Problem Relation Age of Onset   Kidney disease Mother        kidney  transplant   Diabetes Mother    Hyperlipidemia Mother    Hypertension Mother    Stroke Mother    Diabetes Father    Hypertension Father    Hyperlipidemia Father    Stroke Father    Kidney disease Father    Schizophrenia Father    Alcoholism Father     Past Surgical History:  Procedure Laterality Date   APPLICATION OF WOUND VAC  11/10/2021   Procedure: APPLICATION OF WOUND VAC;  Surgeon: Harden Jerona GAILS, MD;  Location: [facility] OR;  Service: Orthopedics;;   CHOLECYSTECTOMY N/A 04/18/2015   Procedure: LAPAROSCOPIC CHOLECYSTECTOMY;  Surgeon: Lynda Leos, MD;  Location: [facility] OR;  Service: General;  Laterality: N/A;   HYSTEROSCOPY WITH D & C  12/24/2001   thelbert 09/28/2010   I & D EXTREMITY Right 07/25/2016   Procedure: IRRIGATION AND DEBRIDEMENT RIGHT LEG ULCER, APPLY VERAFLO VAC;  Surgeon: Jerona Harden GAILS, MD;  Location: [facility] OR;  Service: Orthopedics;  Laterality: Right;   I & D EXTREMITY Right 09/28/2018   Procedure: DEBRIDEMENT RIGHT LOWER LEG WITH PLACEMENT WITH PLACEMENT OF SKIN GRAFT AND VAC;  Surgeon: Harden Jerona GAILS, MD;  Location: [facility] OR;  Service: Orthopedics;  Laterality: Right;   I & D EXTREMITY Right 10/03/2018   Procedure: REPEAT IRRIGATION AND DEBRIDEMENT RIGHT LOWER LEG, VAC PLACEMENT;  Surgeon: Harden Jerona GAILS, MD;  Location: [facility] OR;  Service: Orthopedics;  Laterality: Right;   I & D EXTREMITY Right 11/10/2021   Procedure: RIGHT LEG DEBRIDEMENT AND APPLY SKIN GRAFT;  Surgeon: Harden Jerona GAILS, MD;  Location: [facility] OR;  Service: Orthopedics;  Laterality: Right;   INCISE AND DRAIN ABCESS Right 07/14/2016   INCISION AND DRAINAGE Right 09/10/2008   leg:  skin and soft tissue and muscle/notes 09/15/2010   INCISION AND DRAINAGE Right 01/01/2008   Chronic venous stasis insufficiency ulcer,/notes 09/14/2010/   INCISION AND DRAINAGE Right 07/2010   calf w/application wound vac/notes 07/20/2010   INCISION AND DRAINAGE Right 07/25/2016   IRRIGATION AND DEBRIDEMENT RIGHT LEG ULCER,   LAPAROSCOPIC GASTRIC BYPASS   ~ 2007   MULTIPLE TOOTH EXTRACTIONS     SKIN GRAFT SPLIT THICKNESS LEG / FOOT Right 07/25/2016   LEG   SKIN SPLIT GRAFT Right 07/27/2016   Procedure: SKIN GRAFT RIGHT LEG WITH THERASKIN APPLICATION;  Surgeon: Jerona Harden GAILS, MD;  Location: [facility] OR;  Service: Orthopedics;  Laterality: Right;   TONSILLECTOMY     Social History   Occupational History   Occupation: unemployed  Tobacco Use   Smoking status: Never   Smokeless tobacco: Never  Vaping Use   Vaping status: Never Used  Substance and Sexual Activity   Alcohol  use: No   Drug use: No   Sexual activity: Yes    Partners: Male    Birth control/protection: I.U.D.

## 2023-06-23 DIAGNOSIS — Z7901 Long term (current) use of anticoagulants: Secondary | ICD-10-CM | POA: Diagnosis not present

## 2023-06-23 DIAGNOSIS — M5442 Lumbago with sciatica, left side: Secondary | ICD-10-CM | POA: Diagnosis not present

## 2023-06-23 DIAGNOSIS — N1831 Chronic kidney disease, stage 3a: Secondary | ICD-10-CM | POA: Diagnosis not present

## 2023-06-23 DIAGNOSIS — G4733 Obstructive sleep apnea (adult) (pediatric): Secondary | ICD-10-CM | POA: Diagnosis not present

## 2023-06-23 DIAGNOSIS — R7303 Prediabetes: Secondary | ICD-10-CM | POA: Diagnosis not present

## 2023-06-23 DIAGNOSIS — Z0000 Encounter for general adult medical examination without abnormal findings: Secondary | ICD-10-CM | POA: Diagnosis not present

## 2023-06-23 DIAGNOSIS — E782 Mixed hyperlipidemia: Secondary | ICD-10-CM | POA: Diagnosis not present

## 2023-06-23 DIAGNOSIS — G629 Polyneuropathy, unspecified: Secondary | ICD-10-CM | POA: Diagnosis not present

## 2023-06-23 DIAGNOSIS — R1013 Epigastric pain: Secondary | ICD-10-CM | POA: Diagnosis not present

## 2023-06-23 DIAGNOSIS — I10 Essential (primary) hypertension: Secondary | ICD-10-CM | POA: Diagnosis not present

## 2023-06-23 DIAGNOSIS — G8929 Other chronic pain: Secondary | ICD-10-CM | POA: Diagnosis not present

## 2023-07-05 ENCOUNTER — Ambulatory Visit: Payer: 59 | Admitting: Family

## 2023-07-06 ENCOUNTER — Ambulatory Visit (INDEPENDENT_AMBULATORY_CARE_PROVIDER_SITE_OTHER): Payer: 59 | Admitting: Orthopedic Surgery

## 2023-07-06 DIAGNOSIS — I87331 Chronic venous hypertension (idiopathic) with ulcer and inflammation of right lower extremity: Secondary | ICD-10-CM

## 2023-07-06 DIAGNOSIS — Z9889 Other specified postprocedural states: Secondary | ICD-10-CM | POA: Diagnosis not present

## 2023-07-06 DIAGNOSIS — Z945 Skin transplant status: Secondary | ICD-10-CM

## 2023-07-06 DIAGNOSIS — I83019 Varicose veins of right lower extremity with ulcer of unspecified site: Secondary | ICD-10-CM

## 2023-07-06 DIAGNOSIS — L97919 Non-pressure chronic ulcer of unspecified part of right lower leg with unspecified severity: Secondary | ICD-10-CM

## 2023-07-07 ENCOUNTER — Encounter: Payer: Self-pay | Admitting: Orthopedic Surgery

## 2023-07-07 NOTE — Progress Notes (Signed)
Office Visit Note   Patient: Alice [name]           Date of Birth: 1970-10-08           MRN: [id number] Visit Date: 07/06/2023              Requested by: Jackie Plum, MD [address] [place],  [place] [address] PCP: Jackie Plum, MD  Chief Complaint  Patient presents with   Right Leg - Wound Check      HPI: Patient is a 53 year old woman who is seen in follow-up for chronic venous and lymphatic insufficiency ulcer right lower extremity.  Patient is currently using Vashe for dressing changes.  Patient had donated Kerecis micro tissue graft applied last visit.  Assessment & Plan: Visit Diagnoses:  1. H/O skin graft   2. History of artificial skin graft   3. 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  Recommended a size extra-large compression sock.  Follow-Up Instructions: Return in about 4 weeks (around 08/03/2023).   Ortho Exam  Patient is alert, oriented, no adenopathy, well-dressed, normal affect, normal respiratory effort. Examination of the wound has healthy granulation tissue it is flat measures 1 x 3 cm there is no cellulitis or drainage.  There is good epithelial skin growth over the area of the previous chronic wound.  Imaging: No results found.   Labs: Lab Results  Component Value Date   HGBA1C 5.2 09/15/2020   REPTSTATUS 11/15/2021 FINAL 11/10/2021   GRAMSTAIN  11/10/2021    NO WBC SEEN RARE GRAM POSITIVE COCCI RARE GRAM POSITIVE RODS    CULT  11/10/2021    FEW METHICILLIN RESISTANT STAPHYLOCOCCUS AUREUS MODERATE CORYNEBACTERIUM STRIATUM Standardized susceptibility testing for this organism is not available. NO ANAEROBES ISOLATED Performed at [facility], [address] [place], [place] [address]    LABORGA  METHICILLIN RESISTANT STAPHYLOCOCCUS AUREUS 11/10/2021     Lab Results  Component Value Date   ALBUMIN 4.2 09/15/2020   ALBUMIN 3.4 (L) 05/12/2019   ALBUMIN 2.5 (L) 10/08/2018    Lab Results  Component Value Date   MG 1.9 08/19/2019   MG 1.8 12/29/2016   MG 1.9 12/28/2016   Lab Results  Component Value Date   VD25OH 12.0 (L) 09/15/2020    No results found for: "PREALBUMIN"    Latest Ref Rng & Units 06/04/2023   12:12 AM 02/08/2022    2:10 PM 02/08/2022    6:30 AM  CBC EXTENDED  WBC 4.0 - 10.5 K/uL 6.1   5.3   RBC 3.87 - 5.11 MIL/uL 4.54   3.98   Hemoglobin 12.0 - 15.0 g/dL 56.2  13.0  86.5   HCT 36.0 - 46.0 % 36.8  36.0  33.2   Platelets 150 - 400 K/uL 249   240      There is no height or weight on file to calculate BMI.  Orders:  No orders of the defined types were placed in this encounter.  No orders of the defined types were placed in this encounter.    Procedures: No procedures performed  Clinical Data: No additional findings.  ROS:  All other systems negative, except as noted in the HPI. Review of Systems  Objective: Vital Signs: There were no vitals taken for this visit.  Specialty Comments:  No specialty comments available.  PMFS History: Patient Active Problem List   Diagnosis Date Noted   H/O skin graft 11/10/2021   Abscess of right lower leg    SOB (shortness of breath) on exertion 09/15/2020   Vitamin D deficiency 09/15/2020   Hyperglycemia 09/15/2020   Subtherapeutic international normalized ratio (INR)    Postoperative pain    Hypoalbuminemia    Acute on chronic kidney failure (HCC)    Acute blood loss anemia    Benign essential HTN    Other fatigue 10/05/2018   Chronic ulcer of right leg, with fat layer exposed (HCC) 09/28/2018   Chronic ulcer of leg, right, with necrosis of muscle (HCC)    Iron deficiency anemia 01/29/2018   Pulmonary embolus (HCC) 01/09/2018   Encounter for therapeutic drug monitoring 01/09/2018   Severe  malnutrition (HCC) 08/24/2017   Acute kidney injury superimposed on CKD (HCC) 12/28/2016   Hx of skin graft 08/01/2016   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 (HCC) 07/25/2016   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 (HCC) 07/05/2016   Trichomonal infection 11/24/2015   Abdominal pain 05/02/2015   Symptomatic cholelithiasis 04/16/2015   Acute bilateral upper abdominal pain 04/16/2015   Microcytic anemia 05/18/2013   Hypotension, unspecified 05/17/2013   Anemia due to stage 3b chronic kidney disease (HCC) 05/17/2013   Hypokalemia 05/17/2013   Anal fissure 02/06/2013   Anal skin tag 02/06/2013   Allergic rhinitis 03/05/2010   LOW BACK PAIN SYNDROME 12/13/2007   OBESITY, MORBID 12/02/2006   HTN (hypertension) 12/02/2006   History of pulmonary embolism 12/02/2006   History of DVT (deep vein thrombosis) 12/02/2006   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/02/2006   GASTROESOPHAGEAL REFLUX DISEASE 12/02/2006   OSA (obstructive sleep apnea) 12/02/2006   Past Medical History:  Diagnosis Date   Anemia    Anxiety    Arthritis    Chest pain    Chronic bronchitis (HCC)    Chronic upper back pain    Depression    Depression    DVT (deep venous thrombosis) (HCC)    BLE   GERD (gastroesophageal reflux disease)    Headache    "weekly" (04/16/2015)   Hyperlipidemia    Hypertension    Joint pain    Migraine    "monthly" (04/16/2015)   Morbid obesity with BMI of 50.0-59.9, adult (HCC)    Obesity    Other fatigue    Pneumonia 2014   Pulmonary embolism (HCC)    Sinusitis nasal    Sleep apnea    "I'm suppose to wear a mask but I don't" (04/16/2015)   SOB (shortness of breath) on exertion    Varicose veins of right lower extremity    Venous stasis of lower extremity    right   Wears dentures    Wears glasses     Family History  Problem Relation Age of Onset   Kidney disease Mother         kidney transplant   Diabetes Mother    Hyperlipidemia Mother    Hypertension Mother    Stroke Mother    Diabetes Father    Hypertension Father    Hyperlipidemia Father    Stroke Father    Kidney disease Father    Schizophrenia Father    Alcoholism Father     Past Surgical History:  Procedure Laterality Date   APPLICATION OF WOUND VAC  11/10/2021   Procedure: APPLICATION OF WOUND VAC;  Surgeon: Nadara Mustard, MD;  Location: [facility] OR;  Service: Orthopedics;;   CHOLECYSTECTOMY N/A 04/18/2015   Procedure: LAPAROSCOPIC CHOLECYSTECTOMY;  Surgeon: Axel Filler, MD;  Location: [facility] OR;  Service: General;  Laterality: N/A;   HYSTEROSCOPY WITH D & C  12/24/2001   Hattie Perch 09/28/2010   I & D EXTREMITY Right 07/25/2016   Procedure: IRRIGATION AND DEBRIDEMENT RIGHT LEG ULCER, APPLY VERAFLO VAC;  Surgeon: Nadara Mustard, MD;  Location: [facility] OR;  Service: Orthopedics;  Laterality: Right;   I & D EXTREMITY Right 09/28/2018   Procedure: DEBRIDEMENT RIGHT LOWER LEG WITH PLACEMENT WITH PLACEMENT OF SKIN GRAFT AND VAC;  Surgeon: Nadara Mustard, MD;  Location: [facility] OR;  Service: Orthopedics;  Laterality: Right;   I & D EXTREMITY Right 10/03/2018   Procedure: REPEAT IRRIGATION AND DEBRIDEMENT RIGHT LOWER LEG, VAC PLACEMENT;  Surgeon: Nadara Mustard, MD;  Location: [facility] OR;  Service: Orthopedics;  Laterality: Right;   I & D EXTREMITY Right 11/10/2021   Procedure: RIGHT LEG DEBRIDEMENT AND APPLY SKIN GRAFT;  Surgeon: Nadara Mustard, MD;  Location: [facility] OR;  Service: Orthopedics;  Laterality: Right;   INCISE AND DRAIN ABCESS Right 07/14/2016   INCISION AND DRAINAGE Right 09/10/2008   leg:  skin and soft tissue and muscle/notes 09/15/2010   INCISION AND DRAINAGE Right 01/01/2008   Chronic venous stasis insufficiency ulcer,/notes 09/14/2010/   INCISION AND DRAINAGE Right 07/2010   calf w/application wound vac/notes 07/20/2010   INCISION AND DRAINAGE Right 07/25/2016   IRRIGATION AND DEBRIDEMENT RIGHT LEG ULCER,   LAPAROSCOPIC  GASTRIC BYPASS  ~ 2007   MULTIPLE TOOTH EXTRACTIONS     SKIN GRAFT SPLIT THICKNESS LEG / FOOT Right 07/25/2016   LEG   SKIN SPLIT GRAFT Right 07/27/2016   Procedure: SKIN GRAFT RIGHT LEG WITH THERASKIN APPLICATION;  Surgeon: Nadara Mustard, MD;  Location: [facility] OR;  Service: Orthopedics;  Laterality: Right;   TONSILLECTOMY     Social History   Occupational History   Occupation: unemployed  Tobacco Use   Smoking status: Never   Smokeless tobacco: Never  Vaping Use   Vaping status: Never Used  Substance and Sexual Activity   Alcohol use: No   Drug use: No   Sexual activity: Yes    Partners: Male    Birth control/protection: I.U.D.

## 2023-07-17 DIAGNOSIS — I120 Hypertensive chronic kidney disease with stage 5 chronic kidney disease or end stage renal disease: Secondary | ICD-10-CM | POA: Diagnosis not present

## 2023-07-17 DIAGNOSIS — N185 Chronic kidney disease, stage 5: Secondary | ICD-10-CM | POA: Diagnosis not present

## 2023-07-17 DIAGNOSIS — I129 Hypertensive chronic kidney disease with stage 1 through stage 4 chronic kidney disease, or unspecified chronic kidney disease: Secondary | ICD-10-CM | POA: Diagnosis not present

## 2023-07-17 DIAGNOSIS — N2581 Secondary hyperparathyroidism of renal origin: Secondary | ICD-10-CM | POA: Diagnosis not present

## 2023-07-17 DIAGNOSIS — D631 Anemia in chronic kidney disease: Secondary | ICD-10-CM | POA: Diagnosis not present

## 2023-07-17 DIAGNOSIS — N189 Chronic kidney disease, unspecified: Secondary | ICD-10-CM | POA: Diagnosis not present

## 2023-07-17 DIAGNOSIS — G629 Polyneuropathy, unspecified: Secondary | ICD-10-CM | POA: Diagnosis not present

## 2023-08-03 ENCOUNTER — Ambulatory Visit (INDEPENDENT_AMBULATORY_CARE_PROVIDER_SITE_OTHER): Payer: 59 | Admitting: Orthopedic Surgery

## 2023-08-03 DIAGNOSIS — I87331 Chronic venous hypertension (idiopathic) with ulcer and inflammation of right lower extremity: Secondary | ICD-10-CM

## 2023-08-03 DIAGNOSIS — Z945 Skin transplant status: Secondary | ICD-10-CM | POA: Diagnosis not present

## 2023-08-06 ENCOUNTER — Encounter: Payer: Self-pay | Admitting: Orthopedic Surgery

## 2023-08-06 NOTE — Progress Notes (Signed)
 Office Visit Note   Patient: Alice [name]           Date of Birth: 04-11-1971           MRN: [id number] Visit Date: 08/03/2023              Requested by: Jackie Plum, MD [address] [place],  [place] [address] PCP: Jackie Plum, MD  Chief Complaint  Patient presents with   Right Leg - Follow-up      HPI: Patient is a 53 year old woman who is seen in follow-up for right lower extremity chronic ulcer.  She is currently using Vashe dressing changes.  Patient states she is lost the 110 pounds.  Assessment & Plan: Visit Diagnoses:  1. H/O skin graft   2. 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  Follow-Up Instructions: Return in about 2 weeks (around 08/17/2023).   Ortho Exam  Patient is alert, oriented, no adenopathy, well-dressed, normal affect, normal respiratory effort. Examination the wound bed has excellent healing.  There is extensive new epithelialized skin around the chronic wound.  There is no cellulitis no drainage.  The wound measures 1 x 3 mm with flat granulation tissue.  Imaging: No results found.   Labs: Lab Results  Component Value Date   HGBA1C 5.2 09/15/2020   REPTSTATUS 11/15/2021 FINAL 11/10/2021   GRAMSTAIN  11/10/2021    NO WBC SEEN RARE GRAM POSITIVE COCCI RARE GRAM POSITIVE RODS    CULT  11/10/2021    FEW METHICILLIN RESISTANT STAPHYLOCOCCUS AUREUS MODERATE CORYNEBACTERIUM STRIATUM Standardized susceptibility testing for this organism is not available. NO ANAEROBES ISOLATED Performed at [facility], [address] [place], [place] [address]    LABORGA METHICILLIN RESISTANT STAPHYLOCOCCUS AUREUS 11/10/2021     Lab Results  Component Value Date   ALBUMIN 4.2 09/15/2020   ALBUMIN 3.4 (L) 05/12/2019   ALBUMIN 2.5 (L) 10/08/2018    Lab Results  Component Value Date   MG 1.9 08/19/2019    MG 1.8 12/29/2016   MG 1.9 12/28/2016   Lab Results  Component Value Date   VD25OH 12.0 (L) 09/15/2020    No results found for: "PREALBUMIN"    Latest Ref Rng & Units 06/04/2023   12:12 AM 02/08/2022    2:10 PM 02/08/2022    6:30 AM  CBC EXTENDED  WBC 4.0 - 10.5 K/uL 6.1   5.3   RBC 3.87 - 5.11 MIL/uL 4.54   3.98   Hemoglobin 12.0 - 15.0 g/dL 56.2  13.0  86.5   HCT 36.0 - 46.0 % 36.8  36.0  33.2   Platelets 150 - 400 K/uL 249   240      There is no height or weight on file to calculate BMI.  Orders:  No orders of the defined types were placed in this encounter.  No orders of the defined types were placed in this encounter.    Procedures: No procedures performed  Clinical Data: No additional findings.  ROS:  All other systems negative, except as noted in the HPI. Review of Systems  Objective: Vital Signs: There were no vitals taken for this visit.  Specialty Comments:  No specialty comments available.  PMFS History: Patient Active Problem List   Diagnosis Date Noted   H/O skin graft 11/10/2021   Abscess of right lower leg    SOB (shortness of  breath) on exertion 09/15/2020   Vitamin D deficiency 09/15/2020   Hyperglycemia 09/15/2020   Subtherapeutic international normalized ratio (INR)    Postoperative pain    Hypoalbuminemia    Acute on chronic kidney failure (HCC)    Acute blood loss anemia    Benign essential HTN    Other fatigue 10/05/2018   Chronic ulcer of right leg, with fat layer exposed (HCC) 09/28/2018   Chronic ulcer of leg, right, with necrosis of muscle (HCC)    Iron deficiency anemia 01/29/2018   Pulmonary embolus (HCC) 01/09/2018   Encounter for therapeutic drug monitoring 01/09/2018   Severe malnutrition (HCC) 08/24/2017   Acute kidney injury superimposed on CKD (HCC) 12/28/2016   Hx of skin graft 08/01/2016   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 (HCC) 07/25/2016   Idiopathic chronic venous hypertension of  right lower extremity with ulcer and inflammation (HCC) 07/05/2016   Trichomonal infection 11/24/2015   Abdominal pain 05/02/2015   Symptomatic cholelithiasis 04/16/2015   Acute bilateral upper abdominal pain 04/16/2015   Microcytic anemia 05/18/2013   Hypotension, unspecified 05/17/2013   Anemia due to stage 3b chronic kidney disease (HCC) 05/17/2013   Hypokalemia 05/17/2013   Anal fissure 02/06/2013   Anal skin tag 02/06/2013   Allergic rhinitis 03/05/2010   LOW BACK PAIN SYNDROME 12/13/2007   OBESITY, MORBID 12/02/2006   HTN (hypertension) 12/02/2006   History of pulmonary embolism 12/02/2006   History of DVT (deep vein thrombosis) 12/02/2006   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/02/2006   GASTROESOPHAGEAL REFLUX DISEASE 12/02/2006   OSA (obstructive sleep apnea) 12/02/2006   Past Medical History:  Diagnosis Date   Anemia    Anxiety    Arthritis    Chest pain    Chronic bronchitis (HCC)    Chronic upper back pain    Depression    Depression    DVT (deep venous thrombosis) (HCC)    BLE   GERD (gastroesophageal reflux disease)    Headache    "weekly" (04/16/2015)   Hyperlipidemia    Hypertension    Joint pain    Migraine    "monthly" (04/16/2015)   Morbid obesity with BMI of 50.0-59.9, adult (HCC)    Obesity    Other fatigue    Pneumonia 2014   Pulmonary embolism (HCC)    Sinusitis nasal    Sleep apnea    "I'm suppose to wear a mask but I don't" (04/16/2015)   SOB (shortness of breath) on exertion    Varicose veins of right lower extremity    Venous stasis of lower extremity    right   Wears dentures    Wears glasses     Family History  Problem Relation Age of Onset   Kidney disease Mother        kidney transplant   Diabetes Mother    Hyperlipidemia Mother    Hypertension Mother    Stroke Mother    Diabetes Father    Hypertension Father    Hyperlipidemia Father    Stroke Father    Kidney disease Father    Schizophrenia  Father    Alcoholism Father     Past Surgical History:  Procedure Laterality Date   APPLICATION OF WOUND VAC  11/10/2021   Procedure: APPLICATION OF WOUND VAC;  Surgeon: Nadara Mustard, MD;  Location: [facility] OR;  Service: Orthopedics;;   CHOLECYSTECTOMY N/A 04/18/2015   Procedure: LAPAROSCOPIC CHOLECYSTECTOMY;  Surgeon: Axel Filler, MD;  Location:  [facility] OR;  Service: General;  Laterality: N/A;   HYSTEROSCOPY WITH D & C  12/24/2001   Hattie Perch 09/28/2010   I & D EXTREMITY Right 07/25/2016   Procedure: IRRIGATION AND DEBRIDEMENT RIGHT LEG ULCER, APPLY VERAFLO VAC;  Surgeon: Nadara Mustard, MD;  Location: [facility] OR;  Service: Orthopedics;  Laterality: Right;   I & D EXTREMITY Right 09/28/2018   Procedure: DEBRIDEMENT RIGHT LOWER LEG WITH PLACEMENT WITH PLACEMENT OF SKIN GRAFT AND VAC;  Surgeon: Nadara Mustard, MD;  Location: [facility] OR;  Service: Orthopedics;  Laterality: Right;   I & D EXTREMITY Right 10/03/2018   Procedure: REPEAT IRRIGATION AND DEBRIDEMENT RIGHT LOWER LEG, VAC PLACEMENT;  Surgeon: Nadara Mustard, MD;  Location: [facility] OR;  Service: Orthopedics;  Laterality: Right;   I & D EXTREMITY Right 11/10/2021   Procedure: RIGHT LEG DEBRIDEMENT AND APPLY SKIN GRAFT;  Surgeon: Nadara Mustard, MD;  Location: [facility] OR;  Service: Orthopedics;  Laterality: Right;   INCISE AND DRAIN ABCESS Right 07/14/2016   INCISION AND DRAINAGE Right 09/10/2008   leg:  skin and soft tissue and muscle/notes 09/15/2010   INCISION AND DRAINAGE Right 01/01/2008   Chronic venous stasis insufficiency ulcer,/notes 09/14/2010/   INCISION AND DRAINAGE Right 07/2010   calf w/application wound vac/notes 07/20/2010   INCISION AND DRAINAGE Right 07/25/2016   IRRIGATION AND DEBRIDEMENT RIGHT LEG ULCER,   LAPAROSCOPIC GASTRIC BYPASS  ~ 2007   MULTIPLE TOOTH EXTRACTIONS     SKIN GRAFT SPLIT THICKNESS LEG / FOOT Right 07/25/2016   LEG   SKIN SPLIT GRAFT Right 07/27/2016   Procedure: SKIN GRAFT RIGHT LEG WITH THERASKIN APPLICATION;  Surgeon: Nadara Mustard,  MD;  Location: [facility] OR;  Service: Orthopedics;  Laterality: Right;   TONSILLECTOMY     Social History   Occupational History   Occupation: unemployed  Tobacco Use   Smoking status: Never   Smokeless tobacco: Never  Vaping Use   Vaping status: Never Used  Substance and Sexual Activity   Alcohol use: No   Drug use: No   Sexual activity: Yes    Partners: Male    Birth control/protection: I.U.D.

## 2023-08-17 ENCOUNTER — Ambulatory Visit: Admitting: Orthopedic Surgery

## 2023-08-17 DIAGNOSIS — L97919 Non-pressure chronic ulcer of unspecified part of right lower leg with unspecified severity: Secondary | ICD-10-CM | POA: Diagnosis not present

## 2023-08-17 DIAGNOSIS — Z945 Skin transplant status: Secondary | ICD-10-CM | POA: Diagnosis not present

## 2023-08-17 DIAGNOSIS — I83019 Varicose veins of right lower extremity with ulcer of unspecified site: Secondary | ICD-10-CM

## 2023-08-17 DIAGNOSIS — Z9889 Other specified postprocedural states: Secondary | ICD-10-CM

## 2023-08-17 DIAGNOSIS — I87331 Chronic venous hypertension (idiopathic) with ulcer and inflammation of right lower extremity: Secondary | ICD-10-CM

## 2023-08-18 ENCOUNTER — Encounter: Payer: Self-pay | Admitting: Orthopedic Surgery

## 2023-08-18 NOTE — Progress Notes (Signed)
 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  Patient states that she is pending kidney transplant list placement.  She does wear compression socks.  Assessment & Plan: Visit Diagnoses:  1. H/O skin graft   2. 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 History of artificial skin graft   4. 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  Feel the patient should proceed with kidney transplant evaluation due to the resolution of her ulcers.  Follow-Up Instructions: Return if symptoms worsen or fail to improve.   Ortho Exam  Patient is alert, oriented, no adenopathy, well-dressed, normal affect, normal respiratory effort. Examination the ulceration of the right leg is completely healed there is superficial epithelialization.  Patient is pursuing exercise.  Imaging: No results found. No images are attached to the encounter.  Labs: Lab Results  Component Value Date   HGBA1C 5.2 09/15/2020   REPTSTATUS 11/15/2021 FINAL 11/10/2021   GRAMSTAIN  11/10/2021    NO WBC SEEN RARE GRAM POSITIVE COCCI RARE GRAM POSITIVE RODS    CULT  11/10/2021    FEW METHICILLIN RESISTANT STAPHYLOCOCCUS AUREUS MODERATE CORYNEBACTERIUM STRIATUM Standardized susceptibility testing for this organism is not available. NO ANAEROBES ISOLATED Performed at [facility], [address], [place], [place] [address]    LABORGA METHICILLIN RESISTANT STAPHYLOCOCCUS AUREUS  11/10/2021     Lab Results  Component Value Date   ALBUMIN 4.2 09/15/2020   ALBUMIN 3.4 (L) 05/12/2019   ALBUMIN 2.5 (L) 10/08/2018    Lab Results  Component Value Date   MG 1.9 08/19/2019   MG 1.8 12/29/2016   MG 1.9 12/28/2016   Lab Results  Component Value Date   VD25OH 12.0 (L) 09/15/2020    No results found for: "PREALBUMIN"    Latest Ref Rng & Units 06/04/2023   12:12 AM 02/08/2022    2:10 PM 02/08/2022    6:30 AM  CBC EXTENDED  WBC 4.0 - 10.5 K/uL 6.1   5.3   RBC 3.87 - 5.11 MIL/uL 4.54   3.98   Hemoglobin 12.0 - 15.0 g/dL 56.2  13.0  86.5   HCT 36.0 - 46.0 % 36.8  36.0  33.2   Platelets 150 - 400 K/uL 249   240      There is no height or weight on file to calculate BMI.  Orders:  No orders of the defined types were placed in this encounter.  No orders of the defined types were placed in this encounter.    Procedures: No procedures performed  Clinical Data: No additional findings.  ROS:  All other systems negative, except as noted in the HPI. Review of Systems  Objective: Vital Signs: There were no vitals taken for this visit.  Specialty Comments:  No specialty comments available.  PMFS  History: Patient Active Problem List   Diagnosis Date Noted   H/O skin graft 11/10/2021   Abscess of right lower leg    SOB (shortness of breath) on exertion 09/15/2020   Vitamin D deficiency 09/15/2020   Hyperglycemia 09/15/2020   Subtherapeutic international normalized ratio (INR)    Postoperative pain    Hypoalbuminemia    Acute on chronic kidney failure (HCC)    Acute blood loss anemia    Benign essential HTN    Other fatigue 10/05/2018   Chronic ulcer of right leg, with fat layer exposed (HCC) 09/28/2018   Chronic ulcer of leg, right, with necrosis of muscle (HCC)    Iron deficiency anemia 01/29/2018   Pulmonary embolus (HCC) 01/09/2018   Encounter for therapeutic drug monitoring 01/09/2018   Severe malnutrition (HCC) 08/24/2017   Acute kidney  injury superimposed on CKD (HCC) 12/28/2016   Hx of skin graft 08/01/2016   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 (HCC) 07/25/2016   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 (HCC) 07/05/2016   Trichomonal infection 11/24/2015   Abdominal pain 05/02/2015   Symptomatic cholelithiasis 04/16/2015   Acute bilateral upper abdominal pain 04/16/2015   Microcytic anemia 05/18/2013   Hypotension, unspecified 05/17/2013   Anemia due to stage 3b chronic kidney disease (HCC) 05/17/2013   Hypokalemia 05/17/2013   Anal fissure 02/06/2013   Anal skin tag 02/06/2013   Allergic rhinitis 03/05/2010   LOW BACK PAIN SYNDROME 12/13/2007   OBESITY, MORBID 12/02/2006   HTN (hypertension) 12/02/2006   History of pulmonary embolism 12/02/2006   History of DVT (deep vein thrombosis) 12/02/2006   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/02/2006   GASTROESOPHAGEAL REFLUX DISEASE 12/02/2006   OSA (obstructive sleep apnea) 12/02/2006   Past Medical History:  Diagnosis Date   Anemia    Anxiety    Arthritis    Chest pain    Chronic bronchitis (HCC)    Chronic upper back pain    Depression    Depression    DVT (deep venous thrombosis) (HCC)    BLE   GERD (gastroesophageal reflux disease)    Headache    "weekly" (04/16/2015)   Hyperlipidemia    Hypertension    Joint pain    Migraine    "monthly" (04/16/2015)   Morbid obesity with BMI of 50.0-59.9, adult (HCC)    Obesity    Other fatigue    Pneumonia 2014   Pulmonary embolism (HCC)    Sinusitis nasal    Sleep apnea    "I'm suppose to wear a mask but I don't" (04/16/2015)   SOB (shortness of breath) on exertion    Varicose veins of right lower extremity    Venous stasis of lower extremity    right   Wears dentures    Wears glasses     Family History  Problem Relation Age of Onset   Kidney disease Mother        kidney transplant   Diabetes Mother     Hyperlipidemia Mother    Hypertension Mother    Stroke Mother    Diabetes Father    Hypertension Father    Hyperlipidemia Father    Stroke Father    Kidney disease Father    Schizophrenia Father    Alcoholism Father     Past Surgical History:  Procedure Laterality Date   APPLICATION OF WOUND VAC  11/10/2021   Procedure: APPLICATION OF WOUND VAC;  Surgeon: Nadara Mustard, MD;  Location: [facility] OR;  Service: Orthopedics;;   CHOLECYSTECTOMY N/A 04/18/2015   Procedure: LAPAROSCOPIC CHOLECYSTECTOMY;  Surgeon: Axel Filler, MD;  Location: [facility] OR;  Service: General;  Laterality: N/A;   HYSTEROSCOPY WITH D & C  12/24/2001   Hattie Perch 09/28/2010   I & D EXTREMITY Right 07/25/2016   Procedure: IRRIGATION AND DEBRIDEMENT RIGHT LEG ULCER, APPLY VERAFLO VAC;  Surgeon: Nadara Mustard, MD;  Location: [facility] OR;  Service: Orthopedics;  Laterality: Right;   I & D EXTREMITY Right 09/28/2018   Procedure: DEBRIDEMENT RIGHT LOWER LEG WITH PLACEMENT WITH PLACEMENT OF SKIN GRAFT AND VAC;  Surgeon: Nadara Mustard, MD;  Location: [facility] OR;  Service: Orthopedics;  Laterality: Right;   I & D EXTREMITY Right 10/03/2018   Procedure: REPEAT IRRIGATION AND DEBRIDEMENT RIGHT LOWER LEG, VAC PLACEMENT;  Surgeon: Nadara Mustard, MD;  Location: [facility] OR;  Service: Orthopedics;  Laterality: Right;   I & D EXTREMITY Right 11/10/2021   Procedure: RIGHT LEG DEBRIDEMENT AND APPLY SKIN GRAFT;  Surgeon: Nadara Mustard, MD;  Location: [facility] OR;  Service: Orthopedics;  Laterality: Right;   INCISE AND DRAIN ABCESS Right 07/14/2016   INCISION AND DRAINAGE Right 09/10/2008   leg:  skin and soft tissue and muscle/notes 09/15/2010   INCISION AND DRAINAGE Right 01/01/2008   Chronic venous stasis insufficiency ulcer,/notes 09/14/2010/   INCISION AND DRAINAGE Right 07/2010   calf w/application wound vac/notes 07/20/2010   INCISION AND DRAINAGE Right 07/25/2016   IRRIGATION AND DEBRIDEMENT RIGHT LEG ULCER,   LAPAROSCOPIC GASTRIC BYPASS  ~ 2007   MULTIPLE TOOTH  EXTRACTIONS     SKIN GRAFT SPLIT THICKNESS LEG / FOOT Right 07/25/2016   LEG   SKIN SPLIT GRAFT Right 07/27/2016   Procedure: SKIN GRAFT RIGHT LEG WITH THERASKIN APPLICATION;  Surgeon: Nadara Mustard, MD;  Location: [facility] OR;  Service: Orthopedics;  Laterality: Right;   TONSILLECTOMY     Social History   Occupational History   Occupation: unemployed  Tobacco Use   Smoking status: Never   Smokeless tobacco: Never  Vaping Use   Vaping status: Never Used  Substance and Sexual Activity   Alcohol use: No   Drug use: No   Sexual activity: Yes    Partners: Male    Birth control/protection: I.U.D.

## 2023-08-24 DIAGNOSIS — R7303 Prediabetes: Secondary | ICD-10-CM | POA: Diagnosis not present

## 2023-08-24 DIAGNOSIS — N1831 Chronic kidney disease, stage 3a: Secondary | ICD-10-CM | POA: Diagnosis not present

## 2023-08-24 DIAGNOSIS — I10 Essential (primary) hypertension: Secondary | ICD-10-CM | POA: Diagnosis not present

## 2023-08-24 DIAGNOSIS — E782 Mixed hyperlipidemia: Secondary | ICD-10-CM | POA: Diagnosis not present

## 2023-08-24 DIAGNOSIS — G629 Polyneuropathy, unspecified: Secondary | ICD-10-CM | POA: Diagnosis not present

## 2023-08-24 DIAGNOSIS — G8929 Other chronic pain: Secondary | ICD-10-CM | POA: Diagnosis not present

## 2023-08-24 DIAGNOSIS — M5442 Lumbago with sciatica, left side: Secondary | ICD-10-CM | POA: Diagnosis not present

## 2023-08-24 DIAGNOSIS — R1013 Epigastric pain: Secondary | ICD-10-CM | POA: Diagnosis not present

## 2023-08-24 DIAGNOSIS — G4733 Obstructive sleep apnea (adult) (pediatric): Secondary | ICD-10-CM | POA: Diagnosis not present

## 2023-08-24 DIAGNOSIS — Z7901 Long term (current) use of anticoagulants: Secondary | ICD-10-CM | POA: Diagnosis not present

## 2023-09-11 ENCOUNTER — Other Ambulatory Visit: Payer: Self-pay | Admitting: Internal Medicine

## 2023-09-11 DIAGNOSIS — Z1231 Encounter for screening mammogram for malignant neoplasm of breast: Secondary | ICD-10-CM

## 2023-09-12 ENCOUNTER — Ambulatory Visit
Admission: RE | Admit: 2023-09-12 | Discharge: 2023-09-12 | Discharge status: Home / Self Care | Source: Ambulatory Visit | Attending: Internal Medicine | Admitting: Internal Medicine

## 2023-09-12 DIAGNOSIS — Z1231 Encounter for screening mammogram for malignant neoplasm of breast: Secondary | ICD-10-CM

## 2023-09-25 ENCOUNTER — Encounter (HOSPITAL_COMMUNITY): Payer: Self-pay | Admitting: Emergency Medicine

## 2023-09-25 ENCOUNTER — Emergency Department (HOSPITAL_COMMUNITY)
Admission: EM | Admit: 2023-09-25 | Discharge: 2023-09-26 | Discharge status: Against Medical Advice | Attending: Emergency Medicine | Admitting: Emergency Medicine

## 2023-09-25 ENCOUNTER — Other Ambulatory Visit: Payer: Self-pay

## 2023-09-25 DIAGNOSIS — R112 Nausea with vomiting, unspecified: Secondary | ICD-10-CM | POA: Insufficient documentation

## 2023-09-25 DIAGNOSIS — Z5321 Procedure and treatment not carried out due to patient leaving prior to being seen by health care provider: Secondary | ICD-10-CM | POA: Insufficient documentation

## 2023-09-25 LAB — CBC
HCT: 36.3 % (ref 36.0–46.0)
Hemoglobin: 12.1 g/dL (ref 12.0–15.0)
MCH: 27.8 pg (ref 26.0–34.0)
MCHC: 33.3 g/dL (ref 30.0–36.0)
MCV: 83.4 fL (ref 80.0–100.0)
Platelets: 255 10*3/uL (ref 150–400)
RBC: 4.35 MIL/uL (ref 3.87–5.11)
RDW: 13.8 % (ref 11.5–15.5)
WBC: 5.4 10*3/uL (ref 4.0–10.5)
nRBC: 0 % (ref 0.0–0.2)

## 2023-09-25 LAB — COMPREHENSIVE METABOLIC PANEL WITH GFR
ALT: 15 U/L (ref 0–44)
AST: 19 U/L (ref 15–41)
Albumin: 3.8 g/dL (ref 3.5–5.0)
Alkaline Phosphatase: 101 U/L (ref 38–126)
Anion gap: 14 (ref 5–15)
BUN: 45 mg/dL — ABNORMAL HIGH (ref 6–20)
CO2: 25 mmol/L (ref 22–32)
Calcium: 9.8 mg/dL (ref 8.9–10.3)
Chloride: 105 mmol/L (ref 98–111)
Creatinine, Ser: 5.21 mg/dL — ABNORMAL HIGH (ref 0.44–1.00)
GFR, Estimated: 9 mL/min — ABNORMAL LOW (ref >60)
Glucose, Bld: 97 mg/dL (ref 70–99)
Potassium: 3.7 mmol/L (ref 3.5–5.1)
Sodium: 144 mmol/L (ref 135–145)
Total Bilirubin: 0.6 mg/dL (ref 0.0–1.2)
Total Protein: 7.6 g/dL (ref 6.5–8.1)

## 2023-09-25 LAB — URINALYSIS, ROUTINE W REFLEX MICROSCOPIC
Bilirubin Urine: NEGATIVE
Glucose, UA: NEGATIVE mg/dL
Hgb urine dipstick: NEGATIVE
Ketones, ur: NEGATIVE mg/dL
Nitrite: NEGATIVE
Protein, ur: 100 mg/dL — AB
Specific Gravity, Urine: 1.009 (ref 1.005–1.030)
pH: 5 (ref 5.0–8.0)

## 2023-09-25 LAB — HCG, SERUM, QUALITATIVE: Preg, Serum: NEGATIVE

## 2023-09-25 LAB — LIPASE, BLOOD: Lipase: 72 U/L — ABNORMAL HIGH (ref 11–51)

## 2023-09-25 NOTE — ED Triage Notes (Signed)
 Pt c/o N/V x 1 week, states that she has been unable to keep anything down.

## 2023-09-27 DIAGNOSIS — R1114 Bilious vomiting: Secondary | ICD-10-CM | POA: Diagnosis not present

## 2023-09-27 DIAGNOSIS — N1831 Chronic kidney disease, stage 3a: Secondary | ICD-10-CM | POA: Diagnosis not present

## 2023-09-27 DIAGNOSIS — G8929 Other chronic pain: Secondary | ICD-10-CM | POA: Diagnosis not present

## 2023-09-27 DIAGNOSIS — N3000 Acute cystitis without hematuria: Secondary | ICD-10-CM | POA: Diagnosis not present

## 2023-09-27 DIAGNOSIS — E782 Mixed hyperlipidemia: Secondary | ICD-10-CM | POA: Diagnosis not present

## 2023-09-27 DIAGNOSIS — G4733 Obstructive sleep apnea (adult) (pediatric): Secondary | ICD-10-CM | POA: Diagnosis not present

## 2023-09-27 DIAGNOSIS — Z7901 Long term (current) use of anticoagulants: Secondary | ICD-10-CM | POA: Diagnosis not present

## 2023-09-27 DIAGNOSIS — G629 Polyneuropathy, unspecified: Secondary | ICD-10-CM | POA: Diagnosis not present

## 2023-09-27 DIAGNOSIS — R1013 Epigastric pain: Secondary | ICD-10-CM | POA: Diagnosis not present

## 2023-09-27 DIAGNOSIS — I10 Essential (primary) hypertension: Secondary | ICD-10-CM | POA: Diagnosis not present

## 2023-09-27 DIAGNOSIS — M5442 Lumbago with sciatica, left side: Secondary | ICD-10-CM | POA: Diagnosis not present

## 2023-09-27 DIAGNOSIS — R7303 Prediabetes: Secondary | ICD-10-CM | POA: Diagnosis not present

## 2023-10-03 DIAGNOSIS — R1114 Bilious vomiting: Secondary | ICD-10-CM | POA: Diagnosis not present

## 2023-10-04 ENCOUNTER — Other Ambulatory Visit: Payer: Self-pay

## 2023-10-04 ENCOUNTER — Encounter (HOSPITAL_BASED_OUTPATIENT_CLINIC_OR_DEPARTMENT_OTHER): Payer: Self-pay | Admitting: Emergency Medicine

## 2023-10-04 ENCOUNTER — Emergency Department (HOSPITAL_BASED_OUTPATIENT_CLINIC_OR_DEPARTMENT_OTHER)
Admission: EM | Admit: 2023-10-04 | Discharge: 2023-10-04 | Discharge status: Against Medical Advice | Attending: Emergency Medicine | Admitting: Emergency Medicine

## 2023-10-04 DIAGNOSIS — Z5321 Procedure and treatment not carried out due to patient leaving prior to being seen by health care provider: Secondary | ICD-10-CM | POA: Insufficient documentation

## 2023-10-04 DIAGNOSIS — R112 Nausea with vomiting, unspecified: Secondary | ICD-10-CM | POA: Diagnosis not present

## 2023-10-04 NOTE — ED Triage Notes (Signed)
 C/o n/v x 3 weeks. Seen at [facility] on 5/12 for the same.

## 2023-10-06 DIAGNOSIS — N185 Chronic kidney disease, stage 5: Secondary | ICD-10-CM | POA: Diagnosis not present

## 2023-10-12 DIAGNOSIS — N185 Chronic kidney disease, stage 5: Secondary | ICD-10-CM | POA: Diagnosis not present

## 2023-10-12 DIAGNOSIS — I120 Hypertensive chronic kidney disease with stage 5 chronic kidney disease or end stage renal disease: Secondary | ICD-10-CM | POA: Diagnosis not present

## 2023-10-12 DIAGNOSIS — D631 Anemia in chronic kidney disease: Secondary | ICD-10-CM | POA: Diagnosis not present

## 2023-10-12 DIAGNOSIS — N2581 Secondary hyperparathyroidism of renal origin: Secondary | ICD-10-CM | POA: Diagnosis not present

## 2023-10-30 DIAGNOSIS — N185 Chronic kidney disease, stage 5: Secondary | ICD-10-CM | POA: Diagnosis not present

## 2023-10-30 DIAGNOSIS — D631 Anemia in chronic kidney disease: Secondary | ICD-10-CM | POA: Diagnosis not present

## 2023-10-30 DIAGNOSIS — N2581 Secondary hyperparathyroidism of renal origin: Secondary | ICD-10-CM | POA: Diagnosis not present

## 2023-10-30 DIAGNOSIS — I120 Hypertensive chronic kidney disease with stage 5 chronic kidney disease or end stage renal disease: Secondary | ICD-10-CM | POA: Diagnosis not present

## 2023-10-31 DIAGNOSIS — G8929 Other chronic pain: Secondary | ICD-10-CM | POA: Diagnosis not present

## 2023-10-31 DIAGNOSIS — N1831 Chronic kidney disease, stage 3a: Secondary | ICD-10-CM | POA: Diagnosis not present

## 2023-10-31 DIAGNOSIS — I10 Essential (primary) hypertension: Secondary | ICD-10-CM | POA: Diagnosis not present

## 2023-10-31 DIAGNOSIS — Z7901 Long term (current) use of anticoagulants: Secondary | ICD-10-CM | POA: Diagnosis not present

## 2023-10-31 DIAGNOSIS — E782 Mixed hyperlipidemia: Secondary | ICD-10-CM | POA: Diagnosis not present

## 2023-10-31 DIAGNOSIS — M5442 Lumbago with sciatica, left side: Secondary | ICD-10-CM | POA: Diagnosis not present

## 2023-10-31 DIAGNOSIS — R7303 Prediabetes: Secondary | ICD-10-CM | POA: Diagnosis not present

## 2023-10-31 DIAGNOSIS — G4733 Obstructive sleep apnea (adult) (pediatric): Secondary | ICD-10-CM | POA: Diagnosis not present

## 2023-10-31 DIAGNOSIS — G629 Polyneuropathy, unspecified: Secondary | ICD-10-CM | POA: Diagnosis not present

## 2023-10-31 DIAGNOSIS — R1013 Epigastric pain: Secondary | ICD-10-CM | POA: Diagnosis not present

## 2023-12-05 DIAGNOSIS — N185 Chronic kidney disease, stage 5: Secondary | ICD-10-CM | POA: Diagnosis not present

## 2023-12-05 DIAGNOSIS — I120 Hypertensive chronic kidney disease with stage 5 chronic kidney disease or end stage renal disease: Secondary | ICD-10-CM | POA: Diagnosis not present

## 2023-12-05 DIAGNOSIS — N2581 Secondary hyperparathyroidism of renal origin: Secondary | ICD-10-CM | POA: Diagnosis not present

## 2023-12-05 DIAGNOSIS — D631 Anemia in chronic kidney disease: Secondary | ICD-10-CM | POA: Diagnosis not present

## 2023-12-07 DIAGNOSIS — G8929 Other chronic pain: Secondary | ICD-10-CM | POA: Diagnosis not present

## 2023-12-07 DIAGNOSIS — G4733 Obstructive sleep apnea (adult) (pediatric): Secondary | ICD-10-CM | POA: Diagnosis not present

## 2023-12-07 DIAGNOSIS — G629 Polyneuropathy, unspecified: Secondary | ICD-10-CM | POA: Diagnosis not present

## 2023-12-07 DIAGNOSIS — R1013 Epigastric pain: Secondary | ICD-10-CM | POA: Diagnosis not present

## 2023-12-07 DIAGNOSIS — M5442 Lumbago with sciatica, left side: Secondary | ICD-10-CM | POA: Diagnosis not present

## 2023-12-07 DIAGNOSIS — E782 Mixed hyperlipidemia: Secondary | ICD-10-CM | POA: Diagnosis not present

## 2023-12-07 DIAGNOSIS — Z7901 Long term (current) use of anticoagulants: Secondary | ICD-10-CM | POA: Diagnosis not present

## 2023-12-07 DIAGNOSIS — N1831 Chronic kidney disease, stage 3a: Secondary | ICD-10-CM | POA: Diagnosis not present

## 2023-12-07 DIAGNOSIS — I10 Essential (primary) hypertension: Secondary | ICD-10-CM | POA: Diagnosis not present

## 2023-12-07 DIAGNOSIS — R7303 Prediabetes: Secondary | ICD-10-CM | POA: Diagnosis not present

## 2023-12-16 DIAGNOSIS — Z1211 Encounter for screening for malignant neoplasm of colon: Secondary | ICD-10-CM | POA: Diagnosis not present

## 2023-12-16 DIAGNOSIS — Z1212 Encounter for screening for malignant neoplasm of rectum: Secondary | ICD-10-CM | POA: Diagnosis not present

## 2024-01-25 DIAGNOSIS — I340 Nonrheumatic mitral (valve) insufficiency: Secondary | ICD-10-CM | POA: Diagnosis not present

## 2024-01-25 DIAGNOSIS — I517 Cardiomegaly: Secondary | ICD-10-CM | POA: Diagnosis not present

## 2024-01-26 NOTE — Telephone Encounter (Signed)
 Pt called me and I got her SW appt rescheduled. I reminded her of her provider visit.

## 2024-01-30 DIAGNOSIS — N2581 Secondary hyperparathyroidism of renal origin: Secondary | ICD-10-CM | POA: Diagnosis not present

## 2024-01-30 DIAGNOSIS — N185 Chronic kidney disease, stage 5: Secondary | ICD-10-CM | POA: Diagnosis not present

## 2024-01-30 DIAGNOSIS — I120 Hypertensive chronic kidney disease with stage 5 chronic kidney disease or end stage renal disease: Secondary | ICD-10-CM | POA: Diagnosis not present

## 2024-01-30 DIAGNOSIS — D631 Anemia in chronic kidney disease: Secondary | ICD-10-CM | POA: Diagnosis not present

## 2024-03-18 ENCOUNTER — Encounter: Payer: Self-pay | Admitting: Radiology
